# Patient Record
Sex: Male | Born: 1955
Health system: Southern US, Community
[De-identification: ages and names within clinical notes are randomized; demographics above are authoritative.]

## PROBLEM LIST (undated history)

## (undated) DIAGNOSIS — R112 Nausea with vomiting, unspecified: Secondary | ICD-10-CM

## (undated) DIAGNOSIS — Z9289 Personal history of other medical treatment: Secondary | ICD-10-CM

## (undated) DIAGNOSIS — N401 Enlarged prostate with lower urinary tract symptoms: Secondary | ICD-10-CM

## (undated) DIAGNOSIS — E785 Hyperlipidemia, unspecified: Secondary | ICD-10-CM

## (undated) DIAGNOSIS — K4090 Unilateral inguinal hernia, without obstruction or gangrene, not specified as recurrent: Secondary | ICD-10-CM

## (undated) DIAGNOSIS — I499 Cardiac arrhythmia, unspecified: Secondary | ICD-10-CM

## (undated) DIAGNOSIS — J45909 Unspecified asthma, uncomplicated: Secondary | ICD-10-CM

## (undated) DIAGNOSIS — G4700 Insomnia, unspecified: Secondary | ICD-10-CM

## (undated) DIAGNOSIS — J189 Pneumonia, unspecified organism: Secondary | ICD-10-CM

## (undated) DIAGNOSIS — I480 Paroxysmal atrial fibrillation: Secondary | ICD-10-CM

## (undated) DIAGNOSIS — M5126 Other intervertebral disc displacement, lumbar region: Secondary | ICD-10-CM

## (undated) DIAGNOSIS — L0390 Cellulitis, unspecified: Secondary | ICD-10-CM

## (undated) DIAGNOSIS — I4819 Other persistent atrial fibrillation: Secondary | ICD-10-CM

## (undated) DIAGNOSIS — F329 Major depressive disorder, single episode, unspecified: Secondary | ICD-10-CM

## (undated) DIAGNOSIS — F419 Anxiety disorder, unspecified: Secondary | ICD-10-CM

## (undated) DIAGNOSIS — K589 Irritable bowel syndrome without diarrhea: Secondary | ICD-10-CM

## (undated) DIAGNOSIS — R253 Fasciculation: Secondary | ICD-10-CM

## (undated) DIAGNOSIS — G4730 Sleep apnea, unspecified: Secondary | ICD-10-CM

## (undated) DIAGNOSIS — N138 Other obstructive and reflux uropathy: Secondary | ICD-10-CM

## (undated) DIAGNOSIS — Z9889 Other specified postprocedural states: Secondary | ICD-10-CM

## (undated) DIAGNOSIS — N529 Male erectile dysfunction, unspecified: Secondary | ICD-10-CM

## (undated) DIAGNOSIS — F32A Depression, unspecified: Secondary | ICD-10-CM

## (undated) DIAGNOSIS — I10 Essential (primary) hypertension: Secondary | ICD-10-CM

## (undated) DIAGNOSIS — T4145XA Adverse effect of unspecified anesthetic, initial encounter: Secondary | ICD-10-CM

## (undated) DIAGNOSIS — T8859XA Other complications of anesthesia, initial encounter: Secondary | ICD-10-CM

## (undated) DIAGNOSIS — G8929 Other chronic pain: Secondary | ICD-10-CM

## (undated) DIAGNOSIS — K219 Gastro-esophageal reflux disease without esophagitis: Secondary | ICD-10-CM

## (undated) DIAGNOSIS — M545 Low back pain, unspecified: Secondary | ICD-10-CM

## (undated) DIAGNOSIS — I428 Other cardiomyopathies: Secondary | ICD-10-CM

## (undated) DIAGNOSIS — F1120 Opioid dependence, uncomplicated: Secondary | ICD-10-CM

## (undated) DIAGNOSIS — N189 Chronic kidney disease, unspecified: Secondary | ICD-10-CM

## (undated) HISTORY — DX: Other obstructive and reflux uropathy: N13.8

## (undated) HISTORY — DX: Essential (primary) hypertension: I10

## (undated) HISTORY — DX: Other persistent atrial fibrillation: I48.19

## (undated) HISTORY — DX: Unilateral inguinal hernia, without obstruction or gangrene, not specified as recurrent: K40.90

## (undated) HISTORY — DX: Morbid (severe) obesity due to excess calories: E66.01

## (undated) HISTORY — DX: Personal history of other medical treatment: Z92.89

## (undated) HISTORY — DX: Anxiety disorder, unspecified: F41.9

## (undated) HISTORY — DX: Benign prostatic hyperplasia with lower urinary tract symptoms: N40.1

## (undated) HISTORY — DX: Fasciculation: R25.3

## (undated) HISTORY — PX: EYE SURGERY: SHX253

## (undated) HISTORY — DX: Gastro-esophageal reflux disease without esophagitis: K21.9

## (undated) HISTORY — DX: Paroxysmal atrial fibrillation: I48.0

## (undated) HISTORY — DX: Major depressive disorder, single episode, unspecified: F32.9

## (undated) HISTORY — PX: HERNIA REPAIR: SHX51

## (undated) HISTORY — DX: Depression, unspecified: F32.A

## (undated) HISTORY — DX: Hyperlipidemia, unspecified: E78.5

## (undated) HISTORY — DX: Other cardiomyopathies: I42.8

## (undated) HISTORY — DX: Irritable bowel syndrome, unspecified: K58.9

## (undated) HISTORY — DX: Male erectile dysfunction, unspecified: N52.9

## (undated) HISTORY — DX: Low back pain, unspecified: M54.50

## (undated) HISTORY — DX: Insomnia, unspecified: G47.00

## (undated) HISTORY — DX: Other chronic pain: G89.29

---

## 2000-03-29 ENCOUNTER — Emergency Department (HOSPITAL_COMMUNITY): Admission: EM | Admit: 2000-03-29 | Discharge: 2000-03-30 | Payer: Self-pay | Admitting: Emergency Medicine

## 2000-03-29 ENCOUNTER — Encounter: Payer: Self-pay | Admitting: Emergency Medicine

## 2000-04-03 ENCOUNTER — Ambulatory Visit (HOSPITAL_COMMUNITY): Admission: RE | Admit: 2000-04-03 | Discharge: 2000-04-03 | Payer: Self-pay | Admitting: Family Medicine

## 2000-04-03 ENCOUNTER — Encounter: Payer: Self-pay | Admitting: Family Medicine

## 2003-04-11 ENCOUNTER — Encounter: Payer: Self-pay | Admitting: Family Medicine

## 2003-04-12 ENCOUNTER — Inpatient Hospital Stay (HOSPITAL_COMMUNITY): Admission: EM | Admit: 2003-04-12 | Discharge: 2003-04-14 | Payer: Self-pay | Admitting: *Deleted

## 2003-04-12 ENCOUNTER — Encounter (INDEPENDENT_AMBULATORY_CARE_PROVIDER_SITE_OTHER): Payer: Self-pay | Admitting: *Deleted

## 2003-04-13 ENCOUNTER — Encounter: Payer: Self-pay | Admitting: Family Medicine

## 2003-07-28 ENCOUNTER — Ambulatory Visit (HOSPITAL_COMMUNITY): Admission: RE | Admit: 2003-07-28 | Discharge: 2003-07-28 | Payer: Self-pay | Admitting: Cardiology

## 2003-09-06 ENCOUNTER — Encounter: Admission: RE | Admit: 2003-09-06 | Discharge: 2003-12-05 | Payer: Self-pay | Admitting: *Deleted

## 2004-07-15 ENCOUNTER — Encounter: Admission: RE | Admit: 2004-07-15 | Discharge: 2004-07-15 | Payer: Self-pay | Admitting: Cardiology

## 2004-07-16 ENCOUNTER — Ambulatory Visit (HOSPITAL_COMMUNITY): Admission: RE | Admit: 2004-07-16 | Discharge: 2004-07-16 | Payer: Self-pay | Admitting: Cardiology

## 2004-11-25 ENCOUNTER — Inpatient Hospital Stay (HOSPITAL_COMMUNITY): Admission: AD | Admit: 2004-11-25 | Discharge: 2004-11-28 | Payer: Self-pay | Admitting: Cardiology

## 2004-12-06 ENCOUNTER — Ambulatory Visit (HOSPITAL_COMMUNITY): Admission: RE | Admit: 2004-12-06 | Discharge: 2004-12-06 | Payer: Self-pay | Admitting: Cardiology

## 2005-01-31 ENCOUNTER — Ambulatory Visit: Payer: Self-pay | Admitting: Cardiology

## 2005-02-05 ENCOUNTER — Ambulatory Visit: Payer: Self-pay | Admitting: *Deleted

## 2005-02-07 ENCOUNTER — Ambulatory Visit: Payer: Self-pay

## 2005-02-07 ENCOUNTER — Ambulatory Visit: Payer: Self-pay | Admitting: Cardiology

## 2005-02-13 ENCOUNTER — Ambulatory Visit: Payer: Self-pay | Admitting: Internal Medicine

## 2005-02-20 ENCOUNTER — Ambulatory Visit: Payer: Self-pay | Admitting: Internal Medicine

## 2005-02-28 ENCOUNTER — Ambulatory Visit: Payer: Self-pay | Admitting: Internal Medicine

## 2005-03-04 ENCOUNTER — Ambulatory Visit: Payer: Self-pay | Admitting: Cardiology

## 2005-03-07 ENCOUNTER — Ambulatory Visit: Payer: Self-pay | Admitting: Cardiology

## 2005-03-12 ENCOUNTER — Ambulatory Visit: Payer: Self-pay | Admitting: Cardiology

## 2005-03-12 ENCOUNTER — Ambulatory Visit (HOSPITAL_COMMUNITY): Admission: RE | Admit: 2005-03-12 | Discharge: 2005-03-12 | Payer: Self-pay | Admitting: Cardiology

## 2005-03-14 ENCOUNTER — Ambulatory Visit: Payer: Self-pay

## 2005-03-19 ENCOUNTER — Ambulatory Visit: Payer: Self-pay | Admitting: Cardiology

## 2005-03-25 ENCOUNTER — Ambulatory Visit: Payer: Self-pay | Admitting: Cardiology

## 2005-04-22 ENCOUNTER — Ambulatory Visit: Payer: Self-pay | Admitting: Cardiology

## 2005-05-06 ENCOUNTER — Ambulatory Visit: Payer: Self-pay | Admitting: Cardiology

## 2005-05-20 ENCOUNTER — Ambulatory Visit: Payer: Self-pay | Admitting: Internal Medicine

## 2005-06-03 ENCOUNTER — Ambulatory Visit: Payer: Self-pay | Admitting: *Deleted

## 2005-06-20 ENCOUNTER — Ambulatory Visit: Payer: Self-pay | Admitting: Cardiology

## 2005-07-07 HISTORY — PX: ATRIAL ABLATION SURGERY: SHX560

## 2005-07-16 ENCOUNTER — Ambulatory Visit: Payer: Self-pay | Admitting: *Deleted

## 2005-07-16 ENCOUNTER — Ambulatory Visit: Payer: Self-pay | Admitting: Cardiology

## 2005-07-30 ENCOUNTER — Ambulatory Visit: Payer: Self-pay | Admitting: Cardiology

## 2005-08-13 ENCOUNTER — Ambulatory Visit: Payer: Self-pay | Admitting: Cardiology

## 2005-08-20 ENCOUNTER — Ambulatory Visit: Payer: Self-pay

## 2005-09-19 ENCOUNTER — Ambulatory Visit: Payer: Self-pay | Admitting: Cardiology

## 2005-12-24 ENCOUNTER — Ambulatory Visit: Payer: Self-pay | Admitting: *Deleted

## 2006-01-28 ENCOUNTER — Ambulatory Visit: Payer: Self-pay | Admitting: Cardiology

## 2006-01-29 ENCOUNTER — Ambulatory Visit: Payer: Self-pay | Admitting: Internal Medicine

## 2006-01-29 ENCOUNTER — Ambulatory Visit: Payer: Self-pay | Admitting: Cardiology

## 2006-02-05 ENCOUNTER — Ambulatory Visit: Payer: Self-pay | Admitting: Cardiology

## 2006-02-13 ENCOUNTER — Ambulatory Visit: Payer: Self-pay | Admitting: Cardiology

## 2006-02-19 ENCOUNTER — Ambulatory Visit: Payer: Self-pay | Admitting: Cardiology

## 2006-02-26 ENCOUNTER — Ambulatory Visit: Payer: Self-pay | Admitting: Cardiology

## 2006-03-04 ENCOUNTER — Ambulatory Visit: Payer: Self-pay | Admitting: Cardiology

## 2006-03-05 ENCOUNTER — Ambulatory Visit: Payer: Self-pay | Admitting: Cardiology

## 2006-03-24 ENCOUNTER — Ambulatory Visit: Payer: Self-pay

## 2006-03-25 ENCOUNTER — Encounter: Payer: Self-pay | Admitting: Vascular Surgery

## 2006-03-25 ENCOUNTER — Ambulatory Visit: Payer: Self-pay | Admitting: Cardiovascular Disease

## 2006-03-25 ENCOUNTER — Ambulatory Visit (HOSPITAL_COMMUNITY): Admission: RE | Admit: 2006-03-25 | Discharge: 2006-03-25 | Payer: Self-pay | Admitting: Cardiology

## 2006-03-25 ENCOUNTER — Ambulatory Visit: Payer: Self-pay | Admitting: Cardiology

## 2006-03-27 ENCOUNTER — Ambulatory Visit: Payer: Self-pay | Admitting: Cardiovascular Disease

## 2006-04-03 ENCOUNTER — Ambulatory Visit: Payer: Self-pay | Admitting: Cardiology

## 2006-04-17 ENCOUNTER — Ambulatory Visit: Payer: Self-pay | Admitting: Cardiology

## 2006-04-22 ENCOUNTER — Ambulatory Visit: Payer: Self-pay | Admitting: Cardiology

## 2006-05-11 ENCOUNTER — Ambulatory Visit: Payer: Self-pay | Admitting: Internal Medicine

## 2006-06-03 ENCOUNTER — Ambulatory Visit: Payer: Self-pay | Admitting: Cardiovascular Disease

## 2006-06-15 ENCOUNTER — Ambulatory Visit: Payer: Self-pay | Admitting: Cardiology

## 2006-06-17 ENCOUNTER — Ambulatory Visit: Payer: Self-pay | Admitting: Cardiology

## 2006-09-09 ENCOUNTER — Ambulatory Visit: Payer: Self-pay | Admitting: *Deleted

## 2006-10-07 ENCOUNTER — Ambulatory Visit: Payer: Self-pay | Admitting: Cardiovascular Disease

## 2008-10-17 ENCOUNTER — Emergency Department (HOSPITAL_COMMUNITY): Admission: EM | Admit: 2008-10-17 | Discharge: 2008-10-17 | Payer: Self-pay | Admitting: Family Medicine

## 2009-02-09 ENCOUNTER — Telehealth: Payer: Self-pay | Admitting: Cardiology

## 2009-02-12 DIAGNOSIS — I10 Essential (primary) hypertension: Secondary | ICD-10-CM

## 2009-02-12 DIAGNOSIS — E785 Hyperlipidemia, unspecified: Secondary | ICD-10-CM | POA: Insufficient documentation

## 2009-02-12 DIAGNOSIS — I4891 Unspecified atrial fibrillation: Secondary | ICD-10-CM

## 2009-02-13 ENCOUNTER — Inpatient Hospital Stay (HOSPITAL_COMMUNITY): Admission: EM | Admit: 2009-02-13 | Discharge: 2009-02-15 | Payer: Self-pay | Admitting: Emergency Medicine

## 2009-02-13 ENCOUNTER — Encounter: Payer: Self-pay | Admitting: Internal Medicine

## 2009-02-13 ENCOUNTER — Ambulatory Visit: Payer: Self-pay | Admitting: Cardiology

## 2009-02-13 DIAGNOSIS — F418 Other specified anxiety disorders: Secondary | ICD-10-CM | POA: Insufficient documentation

## 2009-02-13 DIAGNOSIS — F329 Major depressive disorder, single episode, unspecified: Secondary | ICD-10-CM | POA: Insufficient documentation

## 2009-02-13 DIAGNOSIS — J309 Allergic rhinitis, unspecified: Secondary | ICD-10-CM | POA: Insufficient documentation

## 2009-02-14 ENCOUNTER — Encounter: Payer: Self-pay | Admitting: Internal Medicine

## 2009-02-14 ENCOUNTER — Encounter (INDEPENDENT_AMBULATORY_CARE_PROVIDER_SITE_OTHER): Payer: Self-pay | Admitting: Emergency Medicine

## 2009-02-14 ENCOUNTER — Encounter: Payer: Self-pay | Admitting: Cardiology

## 2009-02-15 ENCOUNTER — Encounter: Payer: Self-pay | Admitting: Internal Medicine

## 2009-02-17 ENCOUNTER — Encounter: Payer: Self-pay | Admitting: Internal Medicine

## 2009-02-17 ENCOUNTER — Ambulatory Visit: Payer: Self-pay | Admitting: Internal Medicine

## 2009-02-17 ENCOUNTER — Emergency Department (HOSPITAL_COMMUNITY): Admission: EM | Admit: 2009-02-17 | Discharge: 2009-02-17 | Payer: Self-pay | Admitting: Emergency Medicine

## 2009-02-26 ENCOUNTER — Ambulatory Visit: Payer: Self-pay | Admitting: Cardiovascular Disease

## 2009-02-26 LAB — CONVERTED CEMR LAB: POC INR: 2.9

## 2009-03-05 ENCOUNTER — Ambulatory Visit: Payer: Self-pay | Admitting: Internal Medicine

## 2009-03-07 ENCOUNTER — Encounter (INDEPENDENT_AMBULATORY_CARE_PROVIDER_SITE_OTHER): Payer: Self-pay | Admitting: *Deleted

## 2009-03-08 ENCOUNTER — Telehealth: Payer: Self-pay | Admitting: Cardiology

## 2009-03-14 ENCOUNTER — Encounter: Payer: Self-pay | Admitting: Cardiology

## 2009-03-14 ENCOUNTER — Ambulatory Visit: Payer: Self-pay | Admitting: Cardiology

## 2009-03-14 DIAGNOSIS — I080 Rheumatic disorders of both mitral and aortic valves: Secondary | ICD-10-CM

## 2009-03-14 DIAGNOSIS — I340 Nonrheumatic mitral (valve) insufficiency: Secondary | ICD-10-CM | POA: Insufficient documentation

## 2009-03-14 DIAGNOSIS — I420 Dilated cardiomyopathy: Secondary | ICD-10-CM

## 2009-03-19 ENCOUNTER — Ambulatory Visit: Payer: Self-pay | Admitting: Cardiovascular Disease

## 2009-05-02 ENCOUNTER — Encounter: Payer: Self-pay | Admitting: *Deleted

## 2009-05-22 ENCOUNTER — Encounter (INDEPENDENT_AMBULATORY_CARE_PROVIDER_SITE_OTHER): Payer: Self-pay | Admitting: Pharmacist

## 2009-06-04 ENCOUNTER — Encounter (INDEPENDENT_AMBULATORY_CARE_PROVIDER_SITE_OTHER): Payer: Self-pay | Admitting: Cardiology

## 2009-06-04 ENCOUNTER — Ambulatory Visit: Payer: Self-pay

## 2009-06-04 ENCOUNTER — Ambulatory Visit: Payer: Self-pay | Admitting: Cardiology

## 2009-06-07 ENCOUNTER — Ambulatory Visit: Payer: Self-pay | Admitting: Cardiology

## 2009-06-07 ENCOUNTER — Encounter: Payer: Self-pay | Admitting: Cardiovascular Disease

## 2009-06-07 LAB — CONVERTED CEMR LAB: POC INR: 2.5

## 2009-06-08 ENCOUNTER — Encounter: Payer: Self-pay | Admitting: Cardiology

## 2009-06-08 ENCOUNTER — Ambulatory Visit (HOSPITAL_COMMUNITY): Admission: RE | Admit: 2009-06-08 | Discharge: 2009-06-08 | Payer: Self-pay | Admitting: Cardiology

## 2009-06-14 ENCOUNTER — Ambulatory Visit: Payer: Self-pay | Admitting: Cardiology

## 2009-06-14 ENCOUNTER — Encounter (INDEPENDENT_AMBULATORY_CARE_PROVIDER_SITE_OTHER): Payer: Self-pay | Admitting: *Deleted

## 2009-06-14 ENCOUNTER — Encounter (INDEPENDENT_AMBULATORY_CARE_PROVIDER_SITE_OTHER): Payer: Self-pay | Admitting: Cardiology

## 2009-06-14 LAB — CONVERTED CEMR LAB
BUN: 12 mg/dL (ref 6–23)
Basophils Absolute: 0.1 10*3/uL (ref 0.0–0.1)
Calcium: 8.9 mg/dL (ref 8.4–10.5)
Creatinine, Ser: 1 mg/dL (ref 0.4–1.5)
Eosinophils Absolute: 0.2 10*3/uL (ref 0.0–0.7)
GFR calc non Af Amer: 83.41 mL/min (ref >60)
Glucose, Bld: 139 mg/dL — ABNORMAL HIGH (ref 70–99)
Lymphocytes Relative: 27 % (ref 12.0–46.0)
Monocytes Relative: 7.5 % (ref 3.0–12.0)
Neutrophils Relative %: 62.3 % (ref 43.0–77.0)
POC INR: 3.2
Platelets: 286 10*3/uL (ref 150.0–400.0)
Prothrombin Time: 26.2 s — ABNORMAL HIGH (ref 9.1–11.7)
RDW: 12.7 % (ref 11.5–14.6)
Sodium: 140 meq/L (ref 135–145)

## 2009-06-18 ENCOUNTER — Ambulatory Visit: Payer: Self-pay | Admitting: Cardiology

## 2009-07-10 ENCOUNTER — Encounter: Payer: Self-pay | Admitting: Cardiology

## 2009-07-16 ENCOUNTER — Ambulatory Visit: Payer: Self-pay | Admitting: Internal Medicine

## 2009-07-16 ENCOUNTER — Encounter (INDEPENDENT_AMBULATORY_CARE_PROVIDER_SITE_OTHER): Payer: Self-pay | Admitting: Cardiology

## 2009-07-30 ENCOUNTER — Ambulatory Visit: Payer: Self-pay | Admitting: Cardiology

## 2009-08-30 ENCOUNTER — Encounter (INDEPENDENT_AMBULATORY_CARE_PROVIDER_SITE_OTHER): Payer: Self-pay | Admitting: Cardiology

## 2009-09-07 ENCOUNTER — Telehealth: Payer: Self-pay | Admitting: Cardiology

## 2009-10-30 ENCOUNTER — Encounter (INDEPENDENT_AMBULATORY_CARE_PROVIDER_SITE_OTHER): Payer: Self-pay | Admitting: *Deleted

## 2009-11-23 ENCOUNTER — Encounter (INDEPENDENT_AMBULATORY_CARE_PROVIDER_SITE_OTHER): Payer: Self-pay | Admitting: Pharmacist

## 2010-03-02 ENCOUNTER — Emergency Department (HOSPITAL_COMMUNITY): Admission: EM | Admit: 2010-03-02 | Discharge: 2010-03-02 | Payer: Self-pay | Admitting: Emergency Medicine

## 2010-03-26 ENCOUNTER — Telehealth: Payer: Self-pay | Admitting: Cardiology

## 2010-07-30 ENCOUNTER — Encounter (INDEPENDENT_AMBULATORY_CARE_PROVIDER_SITE_OTHER): Payer: Self-pay | Admitting: Pharmacist

## 2010-08-06 NOTE — Letter (Signed)
Summary: Custom - Delinquent Coumadin 1  Coumadin  [address]   [place], [place] [address]   Phone: [phone]  Fax: [phone]     August 30, 2009 MRN: [id number]   [name] [address] [place], [place]  [address]   Dear [name],  This letter is being sent to you as a reminder that it is necessary for you to get your INR/PT checked regularly so that we can optimize your care.  Our records indicate that you were scheduled to have a test done recently.  As of today, we have not received the results of this test.  It is very important that you have your INR checked.  Please call our office at the number listed above to schedule an appointment at your earliest convenience.    If you have recently had your protime checked or have discontinued this medication, please contact our office at the above phone number to clarify this issue.  Thank you for this prompt attention to this important health care matter.  Sincerely,   [facility] Team

## 2010-08-06 NOTE — Medication Information (Signed)
Summary: rovmp  Anticoagulant Therapy  Managed by: Shelby Dubin, PharmD, BCPS, CPP Referring MD: Dr [name] PCP: Dr Fulton Mole Supervising MD: Tenny Craw MD, Gunnar Fusi Indication 1: Atrial Fibrillation Lab Used: [facility] Site: [facility] INR POC 2.2 INR RANGE 2.0-3.0  Dietary changes: yes       Details: lost 7 pounds with diet  Health status changes: no    Bleeding/hemorrhagic complications: yes       Details: bloody nose with dry weather  Recent/future hospitalizations: no    Any changes in medication regimen? no    Recent/future dental: no  Any missed doses?: no       Is patient compliant with meds? yes       Allergies (verified): No Known Drug Allergies  Anticoagulation Management History:      The patient is taking warfarin and comes in today for a routine follow up visit.  Negative risk factors for bleeding include an age less than 41 years old.  The bleeding index is 'low risk'.  Positive CHADS2 values include History of HTN.  Negative CHADS2 values include Age > 74 years old.  His last INR was 2.5 ratio.  Anticoagulation responsible provider: Tenny Craw MD, Gunnar Fusi.  INR POC: 2.2.  Cuvette Lot#: [id number].  Exp: 08/2010.    Anticoagulation Management Assessment/Plan:      The patient's current anticoagulation dose is Coumadin 5 mg tabs: Take as directed by coumadin clinic..  The target INR is 2.0-3.0.  The next INR is due 08/13/2009.  Anticoagulation instructions were given to patient.  Results were reviewed/authorized by Shelby Dubin, PharmD, BCPS, CPP.  He was notified by Shelby Dubin PharmD, BCPS, CPP.         Prior Anticoagulation Instructions: INR 2.1 Continue 7.5mg s daily except 5mg s on Fridays. Recheck in 3 weeks.   Current Anticoagulation Instructions: INR 2.2  Continue 1 tab on Friday and 1.5 tabs on all other days.   Recheck in 4 weeks.

## 2010-08-06 NOTE — Miscellaneous (Signed)
  Clinical Lists Changes  Observations: Added new observation of RESULTS MISC:                     CONSULTATION  cardioversion     This is cardioversion of atrial fibrillation.  A transesophageal   echocardiogram was performed prior to the procedure that showed no left   atrial appendage thrombus.  The patient was subsequently sedated by   Anesthesia with Diprivan 80 mg intravenously.  Synchronized   cardioversion with 120 joules resulted in sinus rhythm.  There were no   immediate complications.  We would recommend continuing the Coumadin.               Madolyn Frieze [name], MD, [facility]   Electronically Signed            BSC/MEDQ  D:  06/08/2009  T:  06/09/2009  Job:  [id number]   (06/08/2009 13:17)      MISC. Report  Procedure date:  06/08/2009  Findings:                          CONSULTATION  cardioversion     This is cardioversion of atrial fibrillation.  A transesophageal   echocardiogram was performed prior to the procedure that showed no left   atrial appendage thrombus.  The patient was subsequently sedated by   Anesthesia with Diprivan 80 mg intravenously.  Synchronized   cardioversion with 120 joules resulted in sinus rhythm.  There were no   immediate complications.  We would recommend continuing the Coumadin.               Madolyn Frieze [name], MD, [facility]   Electronically Signed            BSC/MEDQ  D:  06/08/2009  T:  06/09/2009  Job:  [id number]

## 2010-08-06 NOTE — Progress Notes (Signed)
Summary: pt rtn call  Phone Note Call from Patient Call back at Work Phone [phone]   Caller: Patient Reason for Call: Talk to Nurse, Talk to Doctor Summary of Call: pt was rtning call to Stanton Kidney she has tried to contact him several time and he has been out of his coumadin for a week and really needs to talk to someone asap. I tried calling coumadin several times and did not get an answer Initial call taken by: Omer Jack,  March 26, 2010 12:49 PM  Follow-up for Phone Call        Telephoned pt he was last seen in CVRR in January. Thus, need to  schedule an appt. Appt made for 03/27/10 Follow-up by: Bethena Midget, RN, BSN,  March 26, 2010 1:30 PM

## 2010-08-06 NOTE — Letter (Signed)
Summary: Handout Printed  Printed Handout:  - Coumadin Instructions-w/out Meds 

## 2010-08-06 NOTE — Progress Notes (Signed)
Summary: REFILL  Phone Note Refill Request Call back at Work Phone [phone] Message from:  Patient on September 07, 2009 2:06 PM  Refills Requested: Medication #1:  LISINOPRIL 2.5 MG TABS Take 1 tablet by mouth once a day [facility] [place] [facility] IS OUT [phone]  Initial call taken by: Judie Grieve,  September 07, 2009 2:07 PM    Prescriptions: LISINOPRIL 2.5 MG TABS (LISINOPRIL) Take 1 tablet by mouth once a day  #30 x 12   Entered by:   Kem Parkinson   Authorized by:   Ferman Hamming, MD, [facility]   Signed by:   Kem Parkinson on 09/07/2009   Method used:   Electronically to        [facility] [address] [phone]* (retail)       [address]       [address]       [place], [place]  [address]       Ph: [phone]       Fax: [phone]   RxID:   [id number]

## 2010-08-06 NOTE — Letter (Signed)
Summary: Custom - Delinquent Coumadin 2  Coumadin  [address]   [place], [place] [address]   Phone: [phone]  Fax: [phone]     Nov 23, 2009 MRN: [id number]   [name] [address] [place], [place]  [address]   Dear [name],  We have attempted to contact you by phone and letter on multiple occasions to contact our office for important blood work associated with the blood thinner, warfarin (Coumadin).  Warfarin is a very important drug that can cause life threatening side effects including, bleeding, and thus requires close laboratory monitoring.  We are unable to accept responsibility for blood thinner-related health problems you may develop because you have not followed our recommendations for appropriate monitoring.  These may include abnormal bleeding occurrences and/or development of blood clots (stroke, heart attack, blood clots in legs or lungs, etc.).  We need for you to contact this office at the number listed above to schedule and complete this very important blood work.  Thank you for your assistance in this urgent matter.  Sincerely,  [facility] Team

## 2010-08-06 NOTE — Letter (Signed)
Summary: Appointment - Missed  [facility], Main Office  [address]   [place], [place] [address]   Phone: [phone]  Fax: [phone]     October 30, 2009 MRN: [id number]   [name] [address] [place], [place]  [address]   Dear Mr. Handa,  Our records indicate you missed your appointment on 10/18/09 with Dr. Johney Frame .It is very important that we reach you to reschedule this appointment. We look forward to participating in your health care needs. Please contact us at the number listed above at your earliest convenience to reschedule this appointment.     Sincerely,   [facility] Scheduling Team

## 2010-08-06 NOTE — Assessment & Plan Note (Signed)
Summary: per check out/sf   Primary Provider:  Dr Fulton Mole   History of Present Illness: Pleasant male with history of paroxysmal atrial fibrillation returns for followup. The patient had a cardiac catheterization in 2004 that showed an ejection fraction of 45% and normal coronary arteries. He did have atrial fibrillation at that time and his LV function improved after  sinus rhythm was restored. He ultimately had atrial fibrillation ablation. He did well for several years. However in August of 2010 he was admitted to [facility] with recurrent atrial fibrillation. A TSH was normal. An echocardiogram performed on August 11 of 2010 showed an ejection fraction of 35-40%. There was biatrial enlargement. There was at least moderate mitral regurgitation. The patient subsequently had a transesophageal echocardiogram that showed an ejection fraction of 35% and moderate mitral regurgitation. He underwent cardioversion at that time as there was no left atrial appendage thrombus. He was seen in followup by Dr. Johney Frame and close followup was felt to be indicated. Possible attempt at another ablation could be considered in the future if his atrial fibrillation recurred. I last saw him in November of 2010 and atrial fibrillation had recurred. We therefore proceeded with a TEE guided cardioversion on June 08, 2009. His ejection fraction was 20-25%, mild mitral regurgitation, moderate to severe left atrial enlargement and mild to moderate right atrial enlargement. Since then he feels well. He denies any dyspnea, chest pain, palpitations or syncope. There is no bleeding.  Current Medications (verified): 1)  Cardizem Cd 360 Mg Xr24h-Cap (Diltiazem Hcl Coated Beads) .... Take 1 Capsule By Mouth Once A Day 2)  Coumadin 5 Mg Tabs (Warfarin Sodium) .... Take As Directed By Coumadin Clinic. 3)  Cymbalta 30 Mg Cpep (Duloxetine Hcl) .... Take 1 Tablet By Mouth Once A Day 4)  Lisinopril 2.5 Mg Tabs (Lisinopril)  .... Take 1 Tablet By Mouth Once A Day 5)  Singulair 10 Mg Tabs (Montelukast Sodium) .... Take 1 Tablet By Mouth Once A Day  Allergies: No Known Drug Allergies  Past History:  Past Medical History: Reviewed history from 03/05/2009 and no changes required. PAROXYSMAL ATRIAL FIBRILLATION (ICD-427.31) HYPERLIPIDEMIA (ICD-272.4) HYPERTENSION (ICD-401.9) COUMADIN THERAPY (ICD-V58.61) DEPRESSION (ICD-311) ALLERGIC RHINITIS (ICD-477.9) MODERATE MR NONISCHEMIC CM (EF 35%)  Past Surgical History: Reviewed history from 03/05/2009 and no changes required.  afib ablation at [facility] 2007  Social History: Reviewed history from 03/05/2009 and no changes required.   The patient denies use of tobacco, alcohol or recreational  drugs. 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 The patient admits to heavy caffeine use and use of decongestants in the last week.  Review of Systems       no fevers or chills, productive cough, hemoptysis, dysphasia, odynophagia, melena, hematochezia, dysuria, hematuria, rash, seizure activity, orthopnea, PND, pedal edema, claudication. Remaining systems are negative.   Vital Signs:  Patient profile:   55 year old male Height:      76 inches Weight:      272 pounds BMI:     33.23 Pulse rate:   89 / minute Resp:     14 per minute BP sitting:   121 / 85  (right arm)  Vitals Entered By: Kem Parkinson (July 30, 2009 4:13 PM)  Physical Exam  General:  Well-developed well-nourished in no acute distress.  Skin is warm and dry.  HEENT is normal.  Neck is supple. No thyromegaly.  Chest is clear to auscultation with normal expansion.  Cardiovascular exam is regular rate and rhythm.  Abdominal exam nontender or distended. No masses palpated. Extremities show no edema. neuro grossly intact    EKG  Procedure date:  07/30/2009  Findings:      Sinus rhythm at a rate of 83.  Axis normal. Incomplete right bundle branch block.  Impression & Recommendations:  Problem # 1:  PAROXYSMAL ATRIAL FIBRILLATION (ICD-427.31) The patient remains in atrial fibrillation status post TEE guided cardioversion. If his atrial fibrillation recurs we will refer her back to [facility] for consideration of repeat ablation. He will continue on his Cardizem and Coumadin. His updated medication list for this problem includes:    Coumadin 5 Mg Tabs (Warfarin sodium) .Marland Kitchen... Take as directed by coumadin clinic.    Coreg 6.25 Mg Tabs (Carvedilol) .Marland Kitchen... 1 tablet 2 times per day  Orders: EKG w/ Interpretation (93[address])  Problem # 2:  CARDIOMYOPATHY (ICD-425.4) The patient has a nonischemic cardio myopathy of uncertain etiology. I have felt previously that this was tachycardia mediated. We will plan to repeat his echocardiogram now that he is in sinus rhythm. I will increase his Coreg to 6.25 mg p.o. b.i.d. Note he did not tolerate Toprol in the past but seems to be tolerating Coreg. If so we will continue to titrate up his Coreg and decrease his Cardizem. His updated medication list for this problem includes:    Cardizem Cd 360 Mg Xr24h-cap (Diltiazem hcl coated beads) .Marland Kitchen... Take 1 capsule by mouth once a day    Coumadin 5 Mg Tabs (Warfarin sodium) .Marland Kitchen... Take as directed by coumadin clinic.    Lisinopril 2.5 Mg Tabs (Lisinopril) .Marland Kitchen... Take 1 tablet by mouth once a day    Coreg 6.25 Mg Tabs (Carvedilol) .Marland Kitchen... 1 tablet 2 times per day  Orders: Echocardiogram (Echo)  Problem # 3:  MITRAL INSUFFICIENCY (ICD-396.3) Followup echocardiogram.  Problem # 4:  COUMADIN THERAPY (ICD-V58.61) Monitored in the Coumadin clinic. Goal INR 2-3.  Problem # 5:  HYPERTENSION (ICD-401.9) Blood pressure controlled on present medications. Will continue. His updated medication list for this problem includes:    Cardizem Cd 360 Mg Xr24h-cap (Diltiazem hcl coated beads) .Marland Kitchen... Take 1 capsule by mouth once a day     Lisinopril 2.5 Mg Tabs (Lisinopril) .Marland Kitchen... Take 1 tablet by mouth once a day    Coreg 6.25 Mg Tabs (Carvedilol) .Marland Kitchen... 1 tablet 2 times per day  Patient Instructions: 1)  Your physician has requested that you have an echocardiogram.  Echocardiography is a painless test that uses sound waves to create images of your heart. It provides your doctor with information about the size and shape of your heart and how well your heart's chambers and valves are working.  This procedure takes approximately one hour. There are no restrictions for this procedure. 2)  Your physician wants you to follow-up in: 6 months  You will receive a reminder letter in the mail two months in advance. If you don't receive a letter, please call our office to schedule the follow-up appointment. Prescriptions: COREG 6.25 MG TABS (CARVEDILOL) 1 tablet 2 times per day  #60 x 6   Entered by:   Deliah Goody, RN   Authorized by:   Ferman Hamming, MD, [facility]   Signed by:   Deliah Goody, RN on 07/30/2009   Method used:   Electronically to        [facility] [address] [phone]* (retail)  [address]       [address]       [place], [place]  [address]       Ph: [phone]       Fax: [phone]   RxID:   [id number]

## 2010-08-08 NOTE — Letter (Signed)
Summary: Custom - Delinquent Coumadin 1  Coumadin  [address]   [place], [place] [address]   Phone: [phone]  Fax: [phone]     July 30, 2010 MRN: [id number]   [name] [address] [place], [place]  [address]   Dear [name],  This letter is being sent to you as a reminder that it is necessary for you to get your INR/PT checked regularly so that we can optimize your care.  Our records indicate that you were scheduled to have a test done recently.  As of today, we have not received the results of this test.  It is very important that you have your INR checked.  Please call our office at the number listed above to schedule an appointment at your earliest convenience.    If you have recently had your protime checked or have discontinued this medication, please contact our office at the above phone number to clarify this issue.  Thank you for this prompt attention to this important health care matter.  Sincerely,   [facility] Team    Appended Document: Custom - Delinquent Coumadin 1 LMOM for pt to call for past due cvrr appt.

## 2010-09-18 ENCOUNTER — Encounter: Payer: Self-pay | Admitting: Cardiology

## 2010-09-18 DIAGNOSIS — I4891 Unspecified atrial fibrillation: Secondary | ICD-10-CM

## 2010-09-18 DIAGNOSIS — Z7901 Long term (current) use of anticoagulants: Secondary | ICD-10-CM | POA: Insufficient documentation

## 2010-09-19 LAB — TROPONIN I: Troponin I: 0.02 ng/mL (ref 0.00–0.06)

## 2010-09-24 ENCOUNTER — Other Ambulatory Visit: Payer: Self-pay | Admitting: Cardiology

## 2010-09-26 NOTE — Telephone Encounter (Signed)
Pt calling back wanting to know if dr [name] was still alive because that must be the reason his refill for lisinipril hasn't been called in to [facility]

## 2010-09-27 ENCOUNTER — Other Ambulatory Visit: Payer: Self-pay | Admitting: Cardiology

## 2010-09-27 ENCOUNTER — Telehealth: Payer: Self-pay | Admitting: *Deleted

## 2010-09-27 DIAGNOSIS — I10 Essential (primary) hypertension: Secondary | ICD-10-CM

## 2010-09-27 MED ORDER — LISINOPRIL 2.5 MG PO TABS: 2.5000 mg | ORAL_TABLET | Freq: Every day | ORAL | Status: DC

## 2010-09-27 NOTE — Telephone Encounter (Signed)
Left message to patient to call back . Last ov 07-30-09. Patient needs an appointment past due . Month supply sent to pharmacy.

## 2010-09-28 ENCOUNTER — Other Ambulatory Visit: Payer: Self-pay | Admitting: Cardiology

## 2010-09-30 ENCOUNTER — Other Ambulatory Visit: Payer: Self-pay

## 2010-09-30 DIAGNOSIS — I428 Other cardiomyopathies: Secondary | ICD-10-CM

## 2010-09-30 MED ORDER — DILTIAZEM HCL ER BEADS 360 MG PO CP24: 360.0000 mg | ORAL_CAPSULE | Freq: Every day | ORAL | Status: DC

## 2010-10-03 NOTE — Telephone Encounter (Signed)
[address] °

## 2010-10-12 LAB — POCT I-STAT, CHEM 8
Chloride: 109 mEq/L (ref 96–112)
Glucose, Bld: 105 mg/dL — ABNORMAL HIGH (ref 70–99)
HCT: 48 % (ref 39.0–52.0)
Potassium: 4.7 mEq/L (ref 3.5–5.1)

## 2010-10-12 LAB — COMPREHENSIVE METABOLIC PANEL
AST: 21 U/L (ref 0–37)
Albumin: 3.8 g/dL (ref 3.5–5.2)
Alkaline Phosphatase: 80 U/L (ref 39–117)
BUN: 10 mg/dL (ref 6–23)
BUN: 16 mg/dL (ref 6–23)
CO2: 28 mEq/L (ref 19–32)
CO2: 28 mEq/L (ref 19–32)
Chloride: 103 mEq/L (ref 96–112)
Chloride: 103 mEq/L (ref 96–112)
Creatinine, Ser: 1.08 mg/dL (ref 0.4–1.5)
Creatinine, Ser: 1.22 mg/dL (ref 0.4–1.5)
GFR calc Af Amer: >60 mL/min (ref >60)
GFR calc non Af Amer: >60 mL/min (ref >60)
GFR calc non Af Amer: >60 mL/min (ref >60)
Glucose, Bld: 118 mg/dL — ABNORMAL HIGH (ref 70–99)
Potassium: 4 mEq/L (ref 3.5–5.1)
Total Bilirubin: 0.9 mg/dL (ref 0.3–1.2)
Total Bilirubin: 1.2 mg/dL (ref 0.3–1.2)

## 2010-10-12 LAB — PROTIME-INR
INR: 1 (ref 0.00–1.49)
INR: 1.2 (ref 0.00–1.49)
Prothrombin Time: 15.4 seconds — ABNORMAL HIGH (ref 11.6–15.2)
Prothrombin Time: 15.5 seconds — ABNORMAL HIGH (ref 11.6–15.2)

## 2010-10-12 LAB — POCT CARDIAC MARKERS
CKMB, poc: <1 ng/mL — ABNORMAL LOW (ref 1.0–8.0)
Troponin i, poc: <0.05 ng/mL (ref 0.00–0.09)

## 2010-10-12 LAB — DIFFERENTIAL
Basophils Absolute: 0.2 10*3/uL — ABNORMAL HIGH (ref 0.0–0.1)
Basophils Relative: 1 % (ref 0–1)
Eosinophils Relative: 1 % (ref 0–5)
Lymphocytes Relative: 20 % (ref 12–46)
Monocytes Absolute: 1.6 10*3/uL — ABNORMAL HIGH (ref 0.1–1.0)

## 2010-10-12 LAB — URINALYSIS, ROUTINE W REFLEX MICROSCOPIC
Bilirubin Urine: NEGATIVE
Ketones, ur: NEGATIVE mg/dL
Nitrite: NEGATIVE
Specific Gravity, Urine: >1.046 — ABNORMAL HIGH (ref 1.005–1.030)
Urobilinogen, UA: 1 mg/dL (ref 0.0–1.0)

## 2010-10-12 LAB — CBC
HCT: 43.2 % (ref 39.0–52.0)
HCT: 47.9 % (ref 39.0–52.0)
MCV: 90.7 fL (ref 78.0–100.0)
MCV: 91.5 fL (ref 78.0–100.0)
RBC: 4.72 MIL/uL (ref 4.22–5.81)
RBC: 5.29 MIL/uL (ref 4.22–5.81)
WBC: 11.6 10*3/uL — ABNORMAL HIGH (ref 4.0–10.5)
WBC: 15.1 10*3/uL — ABNORMAL HIGH (ref 4.0–10.5)

## 2010-10-12 LAB — APTT: aPTT: 166 seconds — ABNORMAL HIGH (ref 24–37)

## 2010-10-12 LAB — LIPID PANEL
Cholesterol: 126 mg/dL (ref 0–200)
LDL Cholesterol: 59 mg/dL (ref 0–99)
VLDL: 19 mg/dL (ref 0–40)

## 2010-10-12 LAB — HEPARIN LEVEL (UNFRACTIONATED): Heparin Unfractionated: 0.59 IU/mL (ref 0.30–0.70)

## 2010-10-12 LAB — LACTIC ACID, PLASMA: Lactic Acid, Venous: 1.2 mmol/L (ref 0.5–2.2)

## 2010-10-29 ENCOUNTER — Other Ambulatory Visit: Payer: Self-pay | Admitting: Cardiology

## 2010-10-31 ENCOUNTER — Encounter: Payer: Self-pay | Admitting: Cardiology

## 2010-11-01 ENCOUNTER — Encounter: Payer: Self-pay | Admitting: Cardiology

## 2010-11-01 NOTE — Progress Notes (Signed)
[id number] male with history of paroxysmal atrial fibrillation returns for followup. The patient had a cardiac catheterization in 2004 that showed an ejection fraction of 45% and normal coronary arteries. He did have atrial fibrillation at that time and his LV function improved after  sinus rhythm was restored. He ultimately had atrial fibrillation ablation. He did well for several years. However in August of 2010 he was admitted to [facility] with recurrent atrial fibrillation. A TSH was normal. An echocardiogram performed on August 11 of 2010 showed an ejection fraction of 35-40%. There was biatrial enlargement. There was at least moderate mitral regurgitation. The patient subsequently had a transesophageal echocardiogram that showed an ejection fraction of 35% and moderate mitral regurgitation. He underwent cardioversion at that time as there was no left atrial appendage thrombus. He was seen in followup by Dr. Johney Frame and close followup was felt to be indicated. Possible attempt at another ablation could be considered in the future if his atrial fibrillation recurred. Atrial fibrillation recurred in November 2010. We therefore proceeded with a TEE guided cardioversion on June 08, 2009. His ejection fraction was 20-25%, mild mitral regurgitation, moderate to severe left atrial enlargement and mild to moderate right atrial enlargement. I last saw him in Jan of 2011. We recommended a FU echo but he did not come for this. Since I last saw him,   Current Outpatient Prescriptions  Medication Sig Dispense Refill  . carvedilol (COREG) 6.25 MG tablet Take 6.25 mg by mouth 2 (two) times daily with a meal.        . diltiazem (TIAZAC) 360 MG 24 hr capsule Take 1 capsule (360 mg total) by mouth daily.  30 capsule  11  . DULoxetine (CYMBALTA) 30 MG capsule Take 30 mg by mouth daily.        Marland Kitchen lisinopril (PRINIVIL,ZESTRIL) 2.5 MG tablet TAKE 1 TABLET BY MOUTH EVERY DAY  30 tablet  0  . montelukast  (SINGULAIR) 10 MG tablet Take 10 mg by mouth at bedtime.        Marland Kitchen warfarin (COUMADIN) 5 MG tablet Take 5 mg by mouth as directed.           Past Medical History  Diagnosis Date  . PAF (paroxysmal atrial fibrillation)   . HLD (hyperlipidemia)   . HTN (hypertension)   . Depression   . Allergic rhinitis   . NICM (nonischemic cardiomyopathy)     Past Surgical History  Procedure Date  . [facility] 2007    [facility]    History   Social History  . Marital Status: Married    Spouse Name: N/A    Number of Children: N/A  . Years of Education: N/A   Occupational History  . owns landscaping business    Social History Main Topics  . Smoking status: Never Smoker   . Smokeless tobacco: Not on file  . Alcohol Use: No  . Drug Use: No  . Sexually Active: Not on file   Other Topics Concern  . Not on file   Social History Narrative  . No narrative on file    ROS: no fevers or chills, productive cough, hemoptysis, dysphasia, odynophagia, melena, hematochezia, dysuria, hematuria, rash, seizure activity, orthopnea, PND, pedal edema, claudication. Remaining systems are negative.  Physical Exam: Well-developed well-nourished in no acute distress.  Skin is warm and dry.  HEENT is normal.  Neck is supple. No thyromegaly.  Chest is clear to auscultation with normal expansion.  Cardiovascular exam is regular  rate and rhythm.  Abdominal exam nontender or distended. No masses palpated. Extremities show no edema. neuro grossly intact  ECG     This encounter was created in error - please disregard.

## 2010-11-04 ENCOUNTER — Ambulatory Visit (INDEPENDENT_AMBULATORY_CARE_PROVIDER_SITE_OTHER): Payer: BC Managed Care – PPO | Admitting: Cardiology

## 2010-11-04 ENCOUNTER — Encounter: Payer: Self-pay | Admitting: Cardiology

## 2010-11-04 VITALS — BP 140/98 | HR 75 | Resp 18 | Ht 76.0 in | Wt 265.0 lb

## 2010-11-04 DIAGNOSIS — I4891 Unspecified atrial fibrillation: Secondary | ICD-10-CM

## 2010-11-04 DIAGNOSIS — I080 Rheumatic disorders of both mitral and aortic valves: Secondary | ICD-10-CM

## 2010-11-04 DIAGNOSIS — I10 Essential (primary) hypertension: Secondary | ICD-10-CM

## 2010-11-04 DIAGNOSIS — I428 Other cardiomyopathies: Secondary | ICD-10-CM

## 2010-11-04 MED ORDER — LISINOPRIL 10 MG PO TABS: 10.0000 mg | ORAL_TABLET | Freq: Every day | ORAL | Status: DC

## 2010-11-04 NOTE — Assessment & Plan Note (Signed)
Patient remains in sinus rhythm.I think his LV function was reduced previously because of atrial fibrillation. Plan repeat echocardiogram. If LV function normalized then only embolic risk factor is hypertension and he will continue aspirin. If his LV function is less than 40 we will discontinue aspirin and resume Coumadin. Continue Cardizem. If LV function reduced we'll change to beta blockade. He did not tolerate Toprol in the past but he did tolerate carvedilol.

## 2010-11-04 NOTE — Assessment & Plan Note (Signed)
Repeat echocardiogram. 

## 2010-11-04 NOTE — Assessment & Plan Note (Signed)
Blood pressure elevated. Increase lisinopril to 10 mg daily. Check potassium and renal function in one week. 

## 2010-11-04 NOTE — Patient Instructions (Signed)
Your physician has requested that you have an echocardiogram. Echocardiography is a painless test that uses sound waves to create images of your heart. It provides your doctor with information about the size and shape of your heart and how well your heart's chambers and valves are working. This procedure takes approximately one hour. There are no restrictions for this procedure.   Your physician wants you to follow-up in: ONE YEAR  You will receive a reminder letter in the mail two months in advance. If you don't receive a letter, please call our office to schedule the follow-up appointment.   INCREASE LISINOPRIL 10 MG ONCE DAILY  Your physician recommends that you return for lab work in: ONE WEEK  TRACK BP=130'S/85

## 2010-11-04 NOTE — Progress Notes (Signed)
[id number] male with history of paroxysmal atrial fibrillation returns for followup. The patient had a cardiac catheterization in 2004 that showed an ejection fraction of 45% and normal coronary arteries. He did have atrial fibrillation at that time and his LV function improved after  sinus rhythm was restored. He ultimately had atrial fibrillation ablation. He did well for several years. However in August of 2010 he was admitted to [facility] with recurrent atrial fibrillation. A TSH was normal. An echocardiogram performed on August 11 of 2010 showed an ejection fraction of 35-40%. There was biatrial enlargement. There was at least moderate mitral regurgitation. The patient subsequently had a transesophageal echocardiogram that showed an ejection fraction of 35% and moderate mitral regurgitation. He underwent cardioversion at that time as there was no left atrial appendage thrombus. He was seen in followup by Dr. Johney Frame and close followup was felt to be indicated. Possible attempt at another ablation could be considered in the future if his atrial fibrillation recurred. Atrial fibrillation recurred in Nov 2010. We therefore proceeded with a TEE guided cardioversion on June 08, 2009. His ejection fraction was 20-25%, mild mitral regurgitation, moderate to severe left atrial enlargement and mild to moderate right atrial enlargement. I last saw him in Jan 2011 and scheduled F/U echo to reassess LV function but he did not return. Since he was last seen,  the patient denies any dyspnea on exertion, orthopnea, PND, pedal edema, palpitations, syncope or chest pain.   Current Outpatient Prescriptions  Medication Sig Dispense Refill  . aspirin 81 MG tablet Take 81 mg by mouth daily.        Marland Kitchen diltiazem (TIAZAC) 360 MG 24 hr capsule Take 1 capsule (360 mg total) by mouth daily.  30 capsule  11  . DULoxetine (CYMBALTA) 30 MG capsule Take 30 mg by mouth daily.        Marland Kitchen lisinopril (PRINIVIL,ZESTRIL) 2.5 MG  tablet TAKE 1 TABLET BY MOUTH EVERY DAY  30 tablet  0  . montelukast (SINGULAIR) 10 MG tablet Take 10 mg by mouth at bedtime.        Marland Kitchen DISCONTD: carvedilol (COREG) 6.25 MG tablet Take 6.25 mg by mouth 2 (two) times daily with a meal.        . DISCONTD: warfarin (COUMADIN) 5 MG tablet Take 5 mg by mouth as directed.           Past Medical History  Diagnosis Date  . PAF (paroxysmal atrial fibrillation)   . HLD (hyperlipidemia)   . HTN (hypertension)   . Depression   . Allergic rhinitis   . NICM (nonischemic cardiomyopathy)     Past Surgical History  Procedure Date  . Atrial ablation surgery 2007    [facility]    History   Social History  . Marital Status: Married    Spouse Name: N/A    Number of Children: N/A  . Years of Education: N/A   Occupational History  . owns landscaping business    Social History Main Topics  . Smoking status: Never Smoker   . Smokeless tobacco: Not on file  . Alcohol Use: No  . Drug Use: No  . Sexually Active: Not on file   Other Topics Concern  . Not on file   Social History Narrative  . No narrative on file    ROS: no fevers or chills, productive cough, hemoptysis, dysphasia, odynophagia, melena, hematochezia, dysuria, hematuria, rash, seizure activity, orthopnea, PND, pedal edema, claudication. Remaining systems are negative.  Physical  Exam: Well-developed well-nourished in no acute distress.  Skin is warm and dry.  HEENT is normal.  Neck is supple. No thyromegaly.  Chest is clear to auscultation with normal expansion.  Cardiovascular exam is regular rate and rhythm.  Abdominal exam nontender or distended. No masses palpated. Extremities show no edema. neuro grossly intact  ECG Normal sinus rhythm at a rate of 75. Nonspecific ST changes.

## 2010-11-04 NOTE — Assessment & Plan Note (Signed)
Felt previously secondary to atrial fibrillation. Repeat echocardiogram with plan as outlined above.

## 2010-11-11 ENCOUNTER — Other Ambulatory Visit: Payer: BC Managed Care – PPO | Admitting: *Deleted

## 2010-11-11 ENCOUNTER — Other Ambulatory Visit (HOSPITAL_COMMUNITY): Payer: BC Managed Care – PPO | Admitting: Radiology

## 2010-11-13 ENCOUNTER — Telehealth: Payer: Self-pay | Admitting: *Deleted

## 2010-11-13 NOTE — Telephone Encounter (Signed)
Would continue aspirin and await echo results. [name]

## 2010-11-13 NOTE — Telephone Encounter (Addendum)
Spoke with pt, he called to report since going off the coumadin he is having severe leg pain. It does not occur during the day or with walking. The pain only occurs at night when he lies down. He has pain in his legs and a tingling feeling in his feet. He is afraid his blood is too thick and causing circulation problems in his legs. His legs have felt better the last two nights because he has been taking a lot of aspirin. Will forward for dr [name] review [name]

## 2010-11-13 NOTE — Telephone Encounter (Signed)
Pt left a message at dr Ludwig Clarks regarding leg pain. Left message for pt to call so we could discuss [name]

## 2010-11-14 NOTE — Telephone Encounter (Signed)
Left message for pt of dr crenshaw's recommendations Honour Schwieger  

## 2010-11-19 NOTE — Discharge Summary (Signed)
NAME:  [name], [name] NO.:  [id number]   MEDICAL RECORD NO.:  [id number]          PATIENT TYPE:  INP   LOCATION:  2002                         FACILITY:  [facility]   PHYSICIAN:  Madolyn Frieze. [name], MD, FACCDATE OF BIRTH:  [birth year]   DATE OF ADMISSION:  02/13/2009  DATE OF DISCHARGE:  02/15/2009                               DISCHARGE SUMMARY   PROCEDURES:  1. A 2-D echocardiogram.  2. Transesophageal echocardiogram.  3. Direct current cardioversion.  4. CT of the abdomen and pelvis with contrast.   PRIMARY FINAL DISCHARGE DIAGNOSIS:  Atrial fibrillation with rapid  ventricular response.   SECONDARY DIAGNOSIS:  1. Left ventricular dysfunction with an EF of 35-40%, moderate      globular hypokinesis, grade 2 diastolic dysfunction, and moderate      mitral regurgitation on echocardiogram.  2. Status post cardiac catheterization in 2004, showing normal      coronary arteries and an ejection fraction of 40-45%.  3. History of atrial fibrillation ablation in 2007, after failing      flecainide and sotalol with cardioversion.  4. Anticoagulation with Coumadin and Lovenox, started this admission.  5. Hypertension.  6. Hyperlipidemia.  7. Asthma.  8. Depression.  9. History of pseudoaneurysm after ablation requiring thrombin      injection.  10.Status post hernia repair.  11.Obesity with a body mass index of 31.3.   TIME AT DISCHARGE:  42 minutes.   HOSPITAL COURSE:  [name] is a 55 year old male with a history of  atrial fibrillation, status post ablation.  He had general malaise and  palpitations that started over 2 weeks ago.  On the day of admission, he  had lower abdominal pain and came to the emergency room.  He was found  to be in AFib RVR and was admitted for further evaluation.   He was initially rate controlled with IV Cardizem.  A CT of the abdomen  and pelvis with contrast was performed and did not show any acute  process.  Lipase was checked  and was within normal limits.  As he was  rate-controlled, his abdominal pain resolved.   Initially, he was started on heparin, but was interested in outpatient  anticoagulation, so this was changed to Lovenox.  He was started on  Coumadin.  An echocardiogram showed left ventricular dysfunction.  TSH  was within normal limits.  His Cardizem was changed to p.o. and  initially rate control was planned with outpatient EP evaluation.  Because of his left ventricular dysfunction, he was kept in the hospital  and a TEE cardioversion was performed.   TEE cardioversion was performed on February 14, 2009, with a bubble study.  His EF was 35% and the bubble study was negative.  He had direct current  cardioversion and was converted to sinus rhythm.  He tolerated the  procedure well.   After the patient was changed to Lovenox, the initial order was written  at 11:00 a.m., but the first dose was not given until 4:00 p.m.  The  following dose was given at 4:00 a.m.  However, because of an error  a  second Lovenox dose was given at 11:00 a.m. the same day.  Pharmacy is  aware and researching this to make sure appropriate action is taken.  Currently, the patient is asymptomatic and has no negative sequelae from  this.   On February 15, 2009, [name] is currently recovering from the  procedure.  Once he has recovered from the sedation, he is considered  stable for discharge with close outpatient followup.  It is hoped that  his left ventricular function will return to normal with him in sinus  rhythm.  The MR appears to have been due to annular dilatation and  hopefully will improve as well.  We will check a echocardiogram in 3  months to follow this.  [name] had issues with fatigue, secondary  to beta-blockade and this will not be used.  Dr. [name] evaluated Mr.  Carollee [name] on February 15, 2009, and he is considered stable for discharge  once he recovers from sedation.   DISCHARGE INSTRUCTIONS:   His activity level is to be increased  gradually.  He is encouraged to stick to a low-sodium, heart-healthy  diet.   FOLLOWUP:  He is to follow up with Dr. [name] in [facility] on  March 07, 2009, at 8:45.  He is to follow up with Dr. Johney Frame in  [place] on March 05, 2009, at 03:45.  He is to follow up with Dr.  Azucena Kuba as needed.  He has a [facility] appointment on Monday, February 19, 2009, at 09:45.   DISCHARGE MEDICATIONS:  Per the computerized discharge med list.      Theodore Demark, PA-C      Madolyn Frieze. [name], MD, [facility]  Electronically Signed    RB/MEDQ  D:  02/15/2009  T:  02/16/2009  Job:  [id number]   cc:   Rosanne Gutting

## 2010-11-19 NOTE — H&P (Signed)
NAME:  [name], [name] NO.:  [id number]   MEDICAL RECORD NO.:  [id number]          PATIENT TYPE:  INP   LOCATION:  2002                         FACILITY:  [facility]   PHYSICIAN:  Jesse Sans. Wall, MD, FACCDATE OF BIRTH:  [birth year]   DATE OF ADMISSION:  02/13/2009  DATE OF DISCHARGE:                              HISTORY & PHYSICAL   PRIMARY CARE PHYSICIAN:  Robert A. Nicholos Johns, MD   PRIMARY CARDIOLOGIST:  Madolyn Frieze. [name], MD, [facility]   CHIEF COMPLAINT:  AFib, RVR.   HISTORY OF PRESENT ILLNESS:  Mr. Morejon is a 55 year old male with a  history of paroxysmal atrial fibrillation.  He had onset of dyspnea on  exertion and weakness on January 26, 2009.  He left for [place] on January 27, 2009.  He was okay while he was on the cruise ship, but [place]  He became more weak and more short of  breath.  He was seen in [place] and treated medically, but not  cardioverted.  His symptoms improved with rate control.   Last p.m., he took his usual medications including aspirin and had  sudden onset of lower abdominal pain.  He does not think he took his  usual Cardizem 120.  His symptoms did not improve, so he came to the  emergency room at approximately 4 a.m.  He received pain medications for  his abdominal pain which improved his symptoms and greatly decreased his  pain.  He did not have nausea, vomiting, or diarrhea.  Because his heart  rate was in the 40s, he was started on the Cardizem drip.  Currently,  the Cardizem drip is at 20 mg an hour.  His heart rate is in the lateral  110s.  With this abdominal pain improved, he is resting comfortably.   PAST MEDICAL HISTORY:  1. Status post cardiac catheterization on April 14, 2003, showing      normal coronary arteries and EF of 40-45%.  2. Status post echocardiogram on April 12, 2003, showing an EF of 35-      45% with no regional wall motion abnormalities and mild MR.  3. Atrial fibrillation,  status post direct current cardioversion x3      and status post ablation in 2007.  4. Anticoagulation with Coumadin, discontinued in appropriate interval      after his ablation.  5. Hypertension.  6. Hyperlipidemia.  7. Asthma.  8. Depression.  9. History of pseudoaneurysm after AFib ablation requiring a thrombin      injection.   SURGICAL HISTORY:  He is status post AFib ablation as well as hernia  repair, direct current cardioversion x3, and cardiac catheterization.   ALLERGIES:  No known drug allergies.   CURRENT MEDICATIONS:  1. Lisinopril.  2. Singulair.  3. Cymbalta.  4. Cardizem LA 120 mg a daily.  5. Aspirin 325 mg daily.   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  He has no history of alcohol, tobacco, or drug  abuse.  He states that  his diet was poor while he was traveling and he  limits his caffeine to 1 [facility] daily.   FAMILY HISTORY:  His father died at age 21.  His mother died at age 12  and neither of his parents nor any siblings have any history of coronary  artery disease or heart rhythm problems.   REVIEW OF SYSTEMS:  He has not had fevers, chills, or sweats.  He  occasionally has reflux symptoms and the abdominal pain that brought him  to the hospital, he has had before, but never this severe.  He has been  taking promethazine for it and because of that he has had some  constipation.  He had a broken ankle right before his cruise in mid July  2010 and it has improved, but not completely healed.  He has had  orthopnea, but denies PND or edema, although he states he was placed on  Lasix in [place].  He is aware of his irregular heart beat and feels that  his heart went out on January 26, 2009, and has not been back in rhythm  since then.  Of note, he states his previous episodes of AFib all  required cardioversion as he has never spontaneously converted.  Full 14-  point review of systems is otherwise  negative.   PHYSICAL EXAMINATION:  VITAL SIGNS:  Temperature is 96.9; blood pressure  141/114; initial heart rate 142, now 108; respiratory rate 18; O2  saturation 97% on room air.  GENERAL:  He is a well-developed, well-nourished white male in no acute  distress.  HEENT:  Normal.  NECK:  There is no lymphadenopathy, thyromegaly, bruit, or JVD noted.  CV:  His heart is irregular in rate and rhythm with an S1 and S2 and no  significant murmur, rub, or gallop is noted.  Distal pulses are intact  in all 4 extremities.  LUNGS:  He has a slight expiratory wheeze in the bases, but no crackles  or rhonchi are noted.  SKIN:  No rashes or lesions are noted.  ABDOMEN:  Soft and nontender with active bowel sounds.  EXTREMITIES:  There is no cyanosis, clubbing, or edema noted.  MUSCULOSKELETAL:  There is no joint deformity or effusions and no spine  or CVA tenderness.  NEURO:  He is alert and oriented.  Cranial nerves II-XII grossly intact.   CT of the abdomen and pelvis with contrast shows minimal pancreatic  haziness, suspicious for mild pancreatitis.  Small dependent bilateral  pleural effusions are noted posteriorly with minimal associated  bibasilar atelectasis.   Chest x-ray shows an ill-defined right lower lobe air space opacity,  atelectasis versus pneumonia.   EKG, AFib, RVR, rate 138 with no acute ischemic changes.   LABORATORY VALUES:  Hemoglobin 16.2, hematocrit 47.9, WBCs 15.1, and  platelets 258.  Sodium 140, potassium 4.0, chloride 103, CO2 of 28, BUN  10, creatinine 1.22, glucose 98, lipase 49, lactic acid 1.2.  Point-of-  care markers negative x1.  Urinalysis negative.   IMPRESSION:  Mr. Hudnall was seen today by Dr. Daleen Squibb.  The control of his  atrial fibrillation is improved on the IV Cardizem.  We will continue  this for now.  He had an ablation at [facility] 3 years ago.  We will consult  EP as he is interested in another ablation and because he did not  maintain sinus rhythm  despite being loaded with sotalol is 2005 prior to  cardioversion and flecainide in 2006 prior to cardioversion.  He will be  started on IV heparin and Coumadin.  We will continue the Cardizem IV  for rate control and possibly changed to p.o. if his rate improved in  a.m.  We will check a TSH and an echo.   Abdominal pain:  CT showed no acute process.  We will check an amylase  as the lipase is within normal limits.  His white count is mildly  elevated, but no fever.  No antibiotics will be used at this time and we  will recheck a CBC in a.m.  He will be started on clear liquids and the  diet will be advanced as tolerated.      Theodore Demark, PA-C      Jesse Sans. Daleen Squibb, MD, [facility]  Electronically Signed    RB/MEDQ  D:  02/13/2009  T:  02/13/2009  Job:  [id number]

## 2010-11-19 NOTE — Consult Note (Signed)
NAME:  [name], [name] NO.:  [id number]   MEDICAL RECORD NO.:  [id number]          PATIENT TYPE:  EMS   LOCATION:  [facility]                         FACILITY:  [facility]   PHYSICIAN:  Bevelyn Buckles. Bensimhon, MDDATE OF BIRTH:  06/29/1956   DATE OF CONSULTATION:  02/17/2009  DATE OF DISCHARGE:                                 CONSULTATION   REQUESTING PHYSICIAN:  [facility].   PRIMARY CARDIOLOGIST:  Madolyn Frieze. Crenshaw, MD.   REASON FOR CONSULTATION:  Right thigh pain status post cardioversion.   HISTORY OF PRESENT ILLNESS:  [name] is a very pleasant 55 year old  male with a history of atrial fibrillation status post atrial  fibrillation ablation in 2007.  He also has hypertension,  hyperlipidemia, and moderate nonischemic cardiomyopathy with an EF of 35  to 40%.  He has had normal coronaries by catheterization in 2007.   He was admitted on August 10th with recurrent atrial fibrillation  despite his previous atrial fibrillation ablation.  He underwent a TEE-  guided cardioversion.  TEE showed an EF of 35%.  He had a successful  cardioversion and was discharged home.  While in the hospital, he did  have a CT scan of the abdomen and pelvis due to pain.  This showed  minimal peripancreatic stranding with a question of mild pancreatitis.  However, his pain resolved and was discharged home.   He tells me he has a greater than 30-month history of severe leg pain.  He has never had a formal workup for this in the past.  He was told that  it might be due to hypokalemia.  He has previously stopped Statin drugs  due to worsening of this pain.  He said last night he developed severe  cramping pain in his right thigh.  This persisted all night and he  finally decided to come to the emergency room.  He has not noticed any  swelling of the leg, he has not had any shortness of breath, he has not  noticed any discoloration in the leg.  He says it is very similar to his  previous  pain, just a lot worse.  There has been no trauma.   REVIEW OF SYSTEMS:  He denies any bleeding, no fevers, no chills.  No  seizure activity, no focal neurologic deficits.  He has not had any  weakness in the leg.  He does say he recently hurt his left ankle and  has been favoring his right leg some and he wonders if that is  responsible.  On remainder of review of systems, all systems negative  except for HPI and Problem List.   PROBLEM LIST:  1. A history of atrial fibrillation status post recent cardioversion.      a.     Status post atrial fibrillation in 2007 after failing       flecainide and sotalol.  2. Hypertension.  3. Hyperlipidemia.  4. Nonischemic cardiomyopathy with an EF of 35 to 40%.      a.     Coronary arteries normal by catheterization in 2007.  5. Asthma.  6. Depression.  7. Obesity.   MEDICATIONS:  Include lisinopril, Singulair, Cymbalta, Cardizem-LA, and  aspirin.  He is also on Coumadin and a Lovenox bridge.   ALLERGIES:  No known drug allergies.   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  He denies any alcohol, tobacco, or drug use.   FAMILY HISTORY:  Father died at age 87.  Mother died at age 15.  No  known coronary artery disease.   PHYSICAL EXAMINATION:  GENERAL:  He is very uncomfortable due to his leg  pain.  VITAL SIGNS:  Blood pressure is 140/100, heart rate is 99, he is sating  98% on room air.  HEENT:  Normal.  NECK:  Supple, there is no JVD, no lymphadenopathy, or thyromegaly.  CARDIAC:  PMI is nondisplaced.  He is regular with no murmurs, rubs, or  gallops.  LUNGS:  Clear.  ABDOMEN:  Obese, nontender, and nondistended, no hepatosplenomegaly, no  bruits, no masses.  EXTREMITIES:  Warm with no cyanosis, clubbing, or  edema.  His right femoral pulse is 2+.  There is no bruit there.  He has  got good pulses throughout both legs.  There is good capillary refill  distally of the right leg.  There  is no swelling.  There is no  ecchymosis.  There is good strength throughout the right leg.  There is  no tenderness to palpation of the muscle.  NEURO:  Alert and oriented x3, cranial nerves II-XII grossly intact, he  moves all 4 extremities without difficulty, affect is pleasant.   ASSESSMENT:  1. Acute on chronic right thigh pain.  2. History of paroxysmal atrial fibrillation status post recent      cardioversion on February 14, 2009.  3. Nonischemic cardiomyopathy with an ejection fraction of 35 to 40%.   PLAN/DISCUSSION:  I do not see any evidence of embolic phenomenon to  explain his right leg pain.  There is a possibility that this could be  due to a spontaneous thigh bleed due to his anticoagulation, but the  pattern is not consistent with this and there is no evidence of bleeding  on exam.  I suspect his pain is musculoskeletal.  This will be followed  up by the emergency room doctors and we can get the Medicine Service  involved as needed.  Please do not hesitate to call us with any further  questions.      Bevelyn Buckles. Bensimhon, MD  Electronically Signed     DRB/MEDQ  D:  02/17/2009  T:  02/17/2009  Job:  [id number]

## 2010-11-20 ENCOUNTER — Emergency Department (HOSPITAL_COMMUNITY)
Admission: EM | Admit: 2010-11-20 | Discharge: 2010-11-20 | Discharge status: Against Medical Advice | Payer: BC Managed Care – PPO | Attending: Emergency Medicine | Admitting: Emergency Medicine

## 2010-11-20 ENCOUNTER — Other Ambulatory Visit: Payer: BC Managed Care – PPO | Admitting: *Deleted

## 2010-11-20 ENCOUNTER — Other Ambulatory Visit (HOSPITAL_COMMUNITY): Payer: BC Managed Care – PPO | Admitting: Radiology

## 2010-11-20 DIAGNOSIS — I10 Essential (primary) hypertension: Secondary | ICD-10-CM | POA: Insufficient documentation

## 2010-11-20 DIAGNOSIS — M545 Low back pain, unspecified: Secondary | ICD-10-CM | POA: Insufficient documentation

## 2010-11-20 DIAGNOSIS — M79609 Pain in unspecified limb: Secondary | ICD-10-CM | POA: Insufficient documentation

## 2010-11-20 DIAGNOSIS — F3289 Other specified depressive episodes: Secondary | ICD-10-CM | POA: Insufficient documentation

## 2010-11-20 DIAGNOSIS — Z79899 Other long term (current) drug therapy: Secondary | ICD-10-CM | POA: Insufficient documentation

## 2010-11-20 DIAGNOSIS — F329 Major depressive disorder, single episode, unspecified: Secondary | ICD-10-CM | POA: Insufficient documentation

## 2010-11-21 ENCOUNTER — Telehealth: Payer: Self-pay | Admitting: Radiology

## 2010-11-21 ENCOUNTER — Telehealth (HOSPITAL_COMMUNITY): Payer: Self-pay | Admitting: Radiology

## 2010-11-21 NOTE — Telephone Encounter (Signed)
No phone call made. Entered in error.

## 2010-11-22 NOTE — Consult Note (Signed)
NAME:  [name], [name]                       ACCOUNT NO.:  [id number]   MEDICAL RECORD NO.:  [id number]                   PATIENT TYPE:  INP   LOCATION:  0157                                 FACILITY:  [facility]   PHYSICIAN:  Armanda Magic, M.D.                  DATE OF BIRTH:  1955-09-11   DATE OF CONSULTATION:  04/12/2003  DATE OF DISCHARGE:                                   CONSULTATION   REFERRING PHYSICIAN:  Elana Alm. Nicholos Johns, M.D.   CHIEF COMPLAINT:  Atrial fibrillation and shortness of breath.   This is a very pleasant 55 year old white male with a history of asthma who  has been experiencing symptoms of nocturnal shortness of breath associated  with insomnia for about three weeks.  He saw his pulmonologist, Dr. Sherene Sires,  because he thought he might be having some problems with asthma.  He was  seen on the fifth and was noted to have irregular heartbeat.  EKG showed new-  onset atrial fibrillation.  He did not want to be admitted and was sent home  on Toprol-XL, aspirin, and Xanax.  Unfortunately, the patient's symptoms  increased.  He started having headaches.  He was taking apparently the Xanax  around-the-clock and then started developing nausea and dizziness which he  though was from the Xanax.  He was evaluated by his primary doctor, Dr.  Nicholos Johns, yesterday, and was admitted to ICU to rule out ischemia because he  was having some diaphoresis as well.   PAST MEDICAL HISTORY:  1. Asthma, on Singulair 10 mg a day.  2. Episodes of musculoskeletal lower extremity pain and poison ivy     dermatitis.  3. Borderline hypertension.   PAST SURGICAL HISTORY:  Left inguinal hernia repair.   SOCIAL HISTORY:  He denies tobacco or alcohol use, no recreational drugs.  He is married and lives in [place].  He is self-employed with  Aeronautical engineer.  He is a heavy caffeine user as well as Sudafed user.   FAMILY HISTORY:  His mother had asthma, breast cancer, and lymphoma.  His  father was a  smoker and died of COPD at 71.  He has a son with asthma.   REVIEW OF SYSTEMS:  He complains of fatigue.   ALLERGIES:  None.   PHYSICAL EXAMINATION:  VITAL SIGNS:  His blood pressure is 135/100, heart  rate 80-115, respirations 20, afebrile.  GENERAL:  This is a well-developed, well-nourished male in no acute  distress.  HEENT:  Benign.  NECK:  Supple without lymphadenopathy.  Carotid upstrokes +2 bilaterally, no  bruits.  LUNGS:  Clear to auscultation throughout.  HEART:  Irregularly irregular.  No murmurs, rubs, or gallops.  Normal S1,  S2.  ABDOMEN:  Soft, nontender, nondistended, with active bowel sounds.  No  hepatosplenomegaly.  EXTREMITIES:  No cyanosis, erythema, or edema; +2 dorsalis pedis pulses  bilaterally.  NEUROLOGIC:  He is alert  and oriented x3, grossly intact.   LABORATORY DATA:  CPK 299, 207; MB 5.1 and 4.5; troponin 0.03 and 0.01;  relative indexes are all normal.  LFTs are normal.  Sodium 142, potassium  4.6, chloride 105, bicarb 30, BUN 22, creatinine 1.4, glucose 109.  Hemoglobin 17, hematocrit 47.9, platelet count 289, white cell count 10.7.  EKG shows normal sinus rhythm with atrial fibrillation, no ST-T wave  abnormalities are noted.  Chest x-ray showed mild vascular prominence  without evidence of CHF, mild cardiomegaly.   ASSESSMENT:  1. New-onset atrial fibrillation, currently fairly well rate controlled with     occasional increases in heart rate up into the 110s to 120s.  He is     currently on IV fluids.  He was started recently on IV Cardizem drip at 5     mg/hour.  Preliminary 2-D echocardiogram report shows low-normal LV     systolic function, EF 45-50%, no valvular abnormalities, no notable     regional wall motion abnormalities.  He does have moderate left atrial     enlargement and a little bit of MR.  EKG shows no inducible ischemia.     His cardiac enzymes, CPKs and MBs are elevated slightly but troponins and     relative index are normal.   He is currently beta blockers, aspirin, and     Lovenox, as well as IV Cardizem.  2. Hypertension with slightly elevated blood pressure on exam today.  I have     a feeling he has probably had some moderate elevations of blood pressure     for some time now, given his left atrial enlargement on his 2-D     echocardiogram.   PLAN:  1. Would continue beta blockers and aspirin and Lovenox for now.  2. Would get a stress Cardiolite study in the morning to rule out inducible     ischemia.  If this is negative would go ahead and switch him over to     Coumadin.  3. Add digoxin 0.25 mg IV q.6h. x1 mg, then 0.25 mg a day p.o.  4. Discontinue Cardizem drip and to continue on beta blockers.  5. Spiral chest CT today to rule out pulmonary embolus given his dyspnea at     night and new-onset atrial fibrillation.  6. I have counseled him on not using any type of over-the-counter     decongestants any more and cut down on his caffeine intake.                                               Armanda Magic, M.D.    TT/MEDQ  D:  04/12/2003  T:  04/12/2003  Job:  [id number]   cc:   Molly Maduro A. Nicholos Johns, M.D.  [address]  [place]  [place] [address]  Fax: [phone]

## 2010-11-22 NOTE — Op Note (Signed)
NAME:  [name], [name] NO.:  [id number]   MEDICAL RECORD NO.:  [id number]          PATIENT TYPE:  OIB   LOCATION:  2899                         FACILITY:  [facility]   PHYSICIAN:  Armanda Magic, M.D.     DATE OF BIRTH:  10/19/1955   DATE OF PROCEDURE:  07/16/2004  DATE OF DISCHARGE:                                 OPERATIVE REPORT   REFERRING PHYSICIAN:  Dr. Fulton Mole.   This is a 55 year old white male with a history of paroxysmal atrial  fibrillation, hyperlipidemia, and nonischemic cardiomyopathy secondary to  tachycardia who presents now with recurrent atrial fibrillation after being  in a hot tub.  His INR has been therapeutic.  He now presents for direct  current cardioversion.   The patient was brought to the day hospital in a fasting, nonsedated state.  Informed consent was obtained.  The patient was connected to continuous  heart rate and pulse oximetry monitoring and intermittent blood pressure  monitoring.  Defibrillation pads were placed on the anterior and posterior  chest and then anesthesia gave 250 mg of __________ .  After adequate  anesthesia was obtained, a synchronized biphasic 75 joules shock was  delivered which was unsuccessful in converting to sinus rhythm.  A  synchronized 100 joules biphasic shock was delivered which subsequently  converted the patient to normal sinus rhythm.  The patient tolerated the  procedure well without complications.   ASSESSMENT:  1.  Recurrent atrial fibrillation.  2.  Systemic anticoagulation with therapeutic INR.  3.  Successful cardioversion to normal sinus rhythm.   PLAN:  Discharge to home after fully awake and follow up with me in 2 weeks.      TT/MEDQ  D:  07/16/2004  T:  07/16/2004  Job:  [id number]   cc:   Fulton Mole, M.D.

## 2010-11-22 NOTE — Op Note (Signed)
NAME:  [name], [name] NO.:  [id number]   MEDICAL RECORD NO.:  [id number]          PATIENT TYPE:  OIB   LOCATION:  2861                         FACILITY:  [facility]   PHYSICIAN:  Charlton Haws, M.D.     DATE OF BIRTH:  [birth year]   DATE OF PROCEDURE:  03/12/2005  DATE OF DISCHARGE:                                 OPERATIVE REPORT   PROCEDURE:  Cardioversion.   [name] is a 55 year old patient of Dr. [name].  He has atrial  fibrillation.  He was started on flecainide a week ago.  He has had a  therapeutic INR for over three weeks, and his INR this morning was 4.2.  He  was anesthetized with 450 mg of sodium Pentothal.  A single 200-joule  biphasic shock was delivered.  He converted to normal sinus rhythm.   IMPRESSION:  Successful direct current cardioversion on therapeutic  anticoagulation, the patient hemodynamically stable without immediate  neurologic sequelae, to follow up with Dr. [name] next week.           ______________________________  Charlton Haws, M.D.     PN/MEDQ  D:  03/12/2005  T:  03/12/2005  Job:  [id number]   cc:   [name], M.D. [facility]  [address]  [address]  [place]  [place] [address]

## 2010-11-22 NOTE — Op Note (Signed)
NAME:  [name], [name]                       ACCOUNT NO.:  [id number]   MEDICAL RECORD NO.:  [id number]                   PATIENT TYPE:  OIB   LOCATION:  2857                                 FACILITY:  [facility]   PHYSICIAN:  Armanda Magic, M.D.                  DATE OF BIRTH:  19-Oct-1955   DATE OF PROCEDURE:  07/28/2003  DATE OF DISCHARGE:                                 OPERATIVE REPORT   REFERRING PHYSICIAN:  Fulton Mole, M.D.   PROCEDURE:  Direct current cardioversion.   OPERATOR:  Armanda Magic, M.D.   INDICATIONS FOR PROCEDURE:  Atrial fibrillation.   COMPLICATIONS:  None.   INTRAVENOUS MEDICATIONS:  Pentothal 350 mg.   This is a very pleasant 55 year old white male who was initially seen by me  in October with new onset atrial fibrillation in association with a presumed  tachycardia related cardiomyopathy, EF 40-45%.  He underwent cardiac  catheterization which showed normal coronary arteries and mild LV  dysfunction.  It was felt that, most likely, this was tachycardia induced  cardiomyopathy.  He was rate controlled, placed on Coumadin, and it took  awhile for his Coumadin to be therapeutic, for four weeks, it finally was  and we are now bringing him into the hospital for direct current  cardioversion.   The patient was brought to the day hospital in a fasting nonsedated state.  Informed consent was obtained.  The patient was connected to continuous  heart rate, pulse oximetry monitoring, intermittent blood pressure  monitoring.  After adequate anesthesia was obtained with Pentothal 350 mg  IV, a 75 joule synchronic biphasic shock was delivered which was  unsuccessful in converting the patient to normal sinus rhythm.  After  insuring the patient was still under anesthesia, a 150 joule synchronized  biphasic shock was delivered which successfully converted the patient to  normal sinus rhythm.  The patient tolerated the procedure well without any  complications.   ASSESSMENT:  1. Atrial fibrillation with successful direct current cardioversion and     normal sinus rhythm.  2. Mild left ventricular  dysfunction with normal coronary arteries.  3. Systemic anticoagulation.   PLAN:  1. Discharge to home after awake and fully ambulatory.  2. Follow up with Dr. Mayford Knife in two weeks.  3. Continue Coumadin and all other medications.                                               Armanda Magic, M.D.   TT/MEDQ  D:  07/28/2003  T:  07/28/2003  Job:  [id number]

## 2010-11-22 NOTE — Assessment & Plan Note (Signed)
[facility]                              CARDIOLOGY OFFICE NOTE   NAME:Alioto, JAMARIE MUSSA                    MRN:          [id number]  DATE:01/28/2006                            DOB:          07-20-1955    Mr. Meng returns for a followup today as an add on.  He is a gentleman  who has a history of paroxysmal atrial fibrillation, hypertension, and  hyperlipidemia.  He has been doing reasonably well recently.  There is some  fatigue, but no significant dyspnea on exertion, orthopnea, PND, pedal  edema, palpitations, presyncope, syncope, or chest pain.  He has seen Dr.  Elvera Lennox at [facility], and his atrial fibrillation ablation has been  scheduled for September.  Beginning Monday of this week, the patient is  complaining of nausea and diarrhea.  He has not had any fevers or chills,  nor is there any productive cough.  He also feels more short of breath,  predominantly when he is laying down at night.  He also complains of mouth  dryness at that same time.   MEDICATIONS:  At present include:  1.  Cardia 360 mg p.o. daily.  2.  Warfarin.  3.  Sodium as directed.  4.  AcipHex 20 mg p.o. b.i.d.  5.  Hydrocodone as needed for leg pain.   PHYSICAL EXAMINATION:  VITAL SIGNS:  Blood pressure 148/98, pulse is 105.  He weighs 259 pounds.  NECK:  Supple, with no bruits.  CHEST:  Clear.  CARDIOVASCULAR:  Regular rate and rhythm.  EXTREMITIES:  No edema.   Electrocardiogram shows atrial fibrillation at rate of 89.  The axis is  normal.  There is no RV conduction delay and there are nonspecific ST  changes.   DIAGNOSES:  1.  Atrial fibrillation.  2.  Coumadin therapy.  3.  Hypertension.  4.  Hyperlipidemia.   PLAN:  Mr. Bentsen is complaining of mild dyspnea of uncertain etiology.  He  is not volume overloaded on exam today and his heart rate appears to be in  the high normal range.  We will increase his Cardizem to 480 mg p.o. daily.  If  his symptoms persist despite increased Cardizem, then we may add low-dose  diuretic.  I will check a B-met and a BNP today to help guide our therapy.  He is complaining of diarrhea and nausea which may be a viral infection.  He  will continue to follow this and we can work it up further if it persists.  He again is scheduled for his atrial fibrillation ablation at [facility] in  September.  He has not had has PT checked in the past  one month, and I have stressed the importance of following his Coumadin  levels.  He will get his PT checked this week.  I will see him back in 4  weeks.                              Madolyn Frieze [name], MD, [facility]    BSC/MedQ  DD:  01/28/2006  DT:  01/28/2006  Job #:  [id number]

## 2010-11-22 NOTE — Assessment & Plan Note (Signed)
[facility]                              CARDIOLOGY OFFICE NOTE   NAME:[name], [name]                    MRN:          [id number]  DATE:03/23/2006                            DOB:          11-13-1955    [name] is a gentleman I follow for atrial fibrillation.  He had atrial  fibrillation ablation at [facility] last week.  He presented to the  C[facility]madin Clinic to have his INR checked today.  He is complaining of left  groin pain and I did evaluate his leg.  There was some ecchymosis.  There  was also a fairly loud bruit.  It was otherwise unremarkable.  He has not  had chest pain or shortness of breath.  We have scheduled him for an  ultrasound of his left groin to exclude pseudoaneurysm or AV fistula  tomorrow.  He was seen at 5:10 p.m. as an add-on.                              Madolyn Frieze [name], MD, [facility]    BSC/MedQ  DD:  03/23/2006  DT:  03/24/2006  Job #:  [id number]

## 2010-11-22 NOTE — Assessment & Plan Note (Signed)
[facility]                              CARDIOLOGY OFFICE NOTE   NAME:[name]                    MRN:          [id number]  DATE:04/22/2006                            DOB:          [birth year]    [name] returns for followup today.  He was recently seen at [facility] and underwent atrial fibrillation ablation.  I did see him in  followup for pain in his left groin, and he was found to have a  pseudoaneurysm which was injected with thrombin.  Since that time he has  done well.  He denies any dyspnea, chest pain, palpitations or syncope.   MEDICATIONS:  1. Coumadin as directed.  2. Cardizem 240 mg p.o. daily.  3. Tikosyn 0.5 mg p.o. b.i.d.   PHYSICAL EXAMINATION:  VITAL SIGNS:  Shows a blood pressure of 133/96, and  his pulse is 79.  NECK:  Supple.  CHEST:  Clear.  CARDIOVASCULAR:  Exam reveals a regular rate and rhythm.  GU:  His left groin shows no hematoma, no bruit.  EXTREMITIES:  Show no edema.   Electrocardiogram shows a sinus rhythm with a normal axis.  There is a right  bundle branch block.  His QT is not prolonged.   DIAGNOSES:  1. Atrial fibrillation, status post ablation at [facility].  2. Coumadin therapy.  3. Hypertension.  4. History of hyperlipidemia.   PLAN:  [name] is doing well from a symptomatic standpoint.  He remains  in sinus rhythm, and he continues on Coumadin with a normal QT.  He is to  see Dr. Berneice Gandy in November.  At that time if he remains in sinus rhythm,  he understands his Tikosyn will be discontinued.  I have also asked him to  discuss the duration of Coumadin therapy.  His blood pressure is elevated  today, but he is under a significant amount of stress from business issues  today.  He will track this, and if it remains elevated then we will increase  his medications as indicated.  He will see [place]  back in December.  If he is doing well at that time, then we will decrease  the frequency of his visits.            ______________________________  Madolyn Frieze [name], MD, [facility]     BSC/MedQ  DD:  04/22/2006  DT:  04/24/2006  Job #:  [id number]

## 2010-11-22 NOTE — H&P (Signed)
NAME:  [name], [name] NO.:  [id number]   MEDICAL RECORD NO.:  [id number]                   PATIENT TYPE:  EMS   LOCATION:  ED                                   FACILITY:  [facility]   PHYSICIAN:  Elana Alm. Nicholos Johns, M.D.               DATE OF BIRTH:  [birth year]   DATE OF ADMISSION:  04/11/2003  DATE OF DISCHARGE:                                HISTORY & PHYSICAL   CHIEF COMPLAINT:  Nausea, vomiting, diaphoresis and lightheadedness.   HISTORY OF PRESENT ILLNESS:  This 55 year old white male who is well known  to me presented with complaints of shortness of breath while going to sleep  and resulting insomnia over the last week. Because of the associated fatigue  with his insomnia, the patient began consuming a large amount of Sudafed and  caffeine during the week as well. Because the patient has known history of  asthma, he determined that his shortness of breath may be asthma related and  elected to see Dr. Sherene Sires of pulmonary service yesterday for evaluation. Exam  and EKG confirmed diagnosis of new onset atrial fibrillation. The patient  was sent out on Toprol XL 50 mg, 1 p.o. daily, ASA 81 mg, p.o. daily and  Xanax 0.5 mg, 1 mg p.o. q.i.d. as needed for anxiety.   The patient initially did well on this regime until noon today when he began  experiencing mild bitemporal headache and nausea. The patient took half a  Xanax at 3 p.m. and the headache and nausea seemed to worsen associated with  dizziness/lightheadedness. The patient's symptoms remained unchanged until  he elected to contact me at approximately 9 p.m.  I elected to see the  patient for evaluation in my office and vital signs there showed a pulse  rate of approximately 72 and irregular and blood pressure of 128/82. The  patient was experiencing dry heaves and diaphoresis and I advised that he go  to the emergency room for evaluation to rule out ischemia.   PAST MEDICAL HISTORY:  1. Asthma on  Singulair 10 mg, 1 p.o. q.p.m.  2. Episodic musculoskeletal back pain.  3. Poison ivy dermatitis in the past.   PAST SURGICAL HISTORY:  1. Left inguinal hernia repair in the past.   SOCIAL HISTORY:  The patient denies use of tobacco, alcohol or recreational  drugs. 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 The patient admits to  heavy caffeine use and use of decongestants in the last week.   FAMILY HISTORY:  The patient's mother is an asthmatic, is a survivor of  breast cancer and currently has lymphoma. The patient's father smokes  cigarettes and died of COPD related complications at age 58. The patient's  sister has asthma.   REVIEW OF SYMPTOMS:  GENERAL:  The patient complains of fatigue. LUNGS:  Denies any shortness of breath. HEART:  Denies any palpitations or chest  pain. ABDOMEN:  Denied any bloody or tarry stools, denied any constipation  or diarrhea. GU:  Denied any urinary frequency, urgency or dysuria.  NEUROLOGIC:  Denied any neurological symptoms. Review of symptoms otherwise  was completely negative.   ALLERGIES:  No known drug allergies.   PHYSICAL EXAMINATION:  VITAL SIGNS:  Temperature 98.6, blood pressure  133/91, pulse 94, respirations 20, O2 sat 97% on room air.  GENERAL:  The patient appeared to be alert and oriented x4 in no acute  distress with the exception of mild anxiety. The patient did show evidence  of mild diaphoresis.  HEENT:  Head normocephalic, atraumatic. Eyes PERRL.  EOMI intact, fundi  benign. Ears, tympanic membranes and canals clear. Nose clear, throat  negative.  NECK:  Supple, full range of motion without thyromegaly. No adenopathy  noted.  LUNGS:  Clear to auscultation with good inspiratory and expiratory effort  noted.  HEART:  Irregularly irregular rhythm without evidence of gallops, rubs or  murmurs.  ABDOMEN:  Soft, nontender, nondistended with normal  bowel sounds. No masses  or organomegaly appreciated. No rebound or guarding noted.  GU/RECTAL:  Not indicated for this hospitalization.  EXTREMITIES:  No cyanosis, clubbing or edema present. Skin without rashes or  lesions present.  NEUROLOGIC:  Cranial nerves II-XII intact. Gross motor sensory intact.  Cerebellar intact. DTRs +2/+4 bilaterally.   LABORATORY DATA:  1. CBC showed mildly elevated WBC count of 10.7, H&H was 17.0 and 47.9,     platelet count 289,000.  2. CMET was within normal limits.  3. CK total 299 elevated with mild elevation of MB band of 5.1 with index     within normal range of 1.7. Troponin I within normal limits at 0.03.  4. EKG showed atrial fibrillation with mildly increased ventricular response     of 103.  5. Chest x-ray showed no acute changes or disease.   IMPRESSION:  1. New onset atrial fibrillation, rule out MI.  2. Asthma controlled.   PLAN:  Case discussed with Dr. Effie Shy. Dr. Effie Shy recommends that patient  be admitted to follow every 6 hour cardiac enzymes and EKG, echocardiogram  advice in the morning along with ETT is needed. Lovenox and aspirin started.  The patient continued on  Singulair and also will be continued on Beta  blockers using Lopressor. Followup with [facility] in the morning.                                               Robert A. Nicholos Johns, M.D.    RAR/MEDQ  D:  04/12/2003  T:  04/12/2003  Job:  [id number]

## 2010-11-22 NOTE — Op Note (Signed)
NAME:  [name], MCMAINS NO.:  [id number]   MEDICAL RECORD NO.:  [id number]          PATIENT TYPE:  OIB   LOCATION:  2899                         FACILITY:  [facility]   PHYSICIAN:  Armanda Magic, M.D.     DATE OF BIRTH:  Sep 06, 1955   DATE OF PROCEDURE:  12/06/2004  DATE OF DISCHARGE:                                 OPERATIVE REPORT   REFERRING PHYSICIAN:  Molly Maduro A. Nicholos Johns, M.D.   This is a 55 year old male with a history of paroxysmal atrial fibrillation  status post cardioversion in the past, now with recurrent atrial  fibrillation, status post sotalol loading and now presents for  cardioversion.   DESCRIPTION OF PROCEDURE:  The patient was brought to the day hospital the  fasting nonsedated state.  Informed consent was obtained.  The patient was  connected to continuous heart rate and pulse oximeter monitoring,  intermittent blood pressure monitoring.  Defibrillation pads were placed on  the anterior left chest and then the back and 250 mg of sodium pentothal was  administered by anesthesia.  After adequate anesthesia was obtained, a 75  joule synchronized biphasic shock was delivered which was unsuccessful in  converting the patient to sinus rhythm.  Then a 150 joule synchronized  biphasic shock was delivered which successfully converted the patient to  sinus rhythm.  The patient tolerated the procedure well without  complication.   ASSESSMENT:  1.  Atrial fibrillation status post sotalol load.  2.  Systemic anticoagulation with therapeutic INR.  3.  Successful DC cardioversion to normal sinus rhythm.   PLAN:  Discharge him after fully awake.  Follow-up with me in one week.       TT/MEDQ  D:  12/06/2004  T:  12/06/2004  Job:  [id number]   cc:   Molly Maduro A. Nicholos Johns, M.D.  [address]  [place]  [place] [address]  Fax: [phone]

## 2010-11-22 NOTE — Discharge Summary (Signed)
NAME:  [name], [name]             ACCOUNT NO.:  [id number]   MEDICAL RECORD NO.:  [id number]          PATIENT TYPE:  INP   LOCATION:  3737                         FACILITY:  [facility]   PHYSICIAN:  Armanda Magic, M.D.     DATE OF BIRTH:  04-15-1956   DATE OF ADMISSION:  11/25/2004  DATE OF DISCHARGE:  11/28/2004                                 DISCHARGE SUMMARY   ADMISSION DIAGNOSES:  1.  Paroxysmal atrial fibrillation.  2.  Nonischemic cardiomyopathy.  3.  Hyperlipidemia.   DISCHARGE DIAGNOSES:  1.  Paroxysmal atrial fibrillation, status post sotalol drug load.  2.  Hypertension, moderate control.  3.  Bradycardia, improved.   PROCEDURES:  None.   DISCHARGE STATUS:  Stable.   HISTORY OF PRESENT ILLNESS:  Please see complete H&P for details, but in  short this is a 55 year old male with known PAF. He underwent successful  cardioversion several months prior to this discharge. He, however, reverted  back to atrial fibrillation after receiving a shock by a spark plug on some  lawn equipment. Hence, he was admitted for drug loading with sotalol for  chemical conversion of atrial fibrillation.   PHYSICAL EXAMINATION ON ADMISSION:  Please see complete H&P and in short  vital signs were stable. Other than irregular heart rhythm he had no  physical abnormality by exam. Admission labs showed therapeutic INR of 2.6.  EKG showed atrial fibrillation with a controlled rate of 78. Corrected QT  interval of 0.481. The rest of his admission labs, which only included a  BMET, were normal.   He had no complaints throughout his entire hospitalization. Vital signs  remained stable with the exception of 2 to 2.8 second pauses on Nov 26, 2004. Otherwise, heart rate remained in the 60s and 70s. His digoxin and  calcium channel blocker were both discontinued as a result of that. He is to  be continued on his sotalol load. She had no problems throughout his  hospitalization. QT interval remained  within normal limits. INR remained  therapeutic and was 2.3 at discharge. He had no further pauses off digoxin  and Cardizem. Blood pressure had some elevation in his diastolic numbers and  his dose of Norvasc increased. This resolved. He was discharged home without  incident on Nov 28, 2004.   DISCHARGE MEDICATIONS:  1.  Norvasc 5 mg daily.  2.  Sotalol 120 mg b.i.d.  3.  Coumadin 5 mg daily, except 2.5 mg on Saturday.  4.  Tricor 145 mg daily.  5.  Zetia 10 mg daily.  6.  Altace 15 mg daily.   He is instructed not to resume his Cartia or his digoxin. He has an  appointment for an EKG and an INR check at Dr. Norris Cross office on Tuesday,  Dec 03, 2004, at 2:45 p.m.      Adrian Saran, N.P.      Armanda Magic, M.D.  Electronically Signed    HB/MEDQ  D:  02/16/2005  T:  02/16/2005  Job:  [id number]

## 2010-11-22 NOTE — Assessment & Plan Note (Signed)
[facility]                              CARDIOLOGY OFFICE NOTE   NAME:[name], [name]                    MRN:          [id number]  DATE:03/04/2006                            DOB:          11/02/1955    [name] returns for followup today.  Please refer to my previous notes  for details.  Since I increased his Cardizem, he feels much better.  He  denies any dyspnea, chest pain, palpitations or syncope.  He is scheduled  for atrial fibrillation ablation at [facility] in early September.   MEDICATIONS AT PRESENT:  1. Coumadin as directed.  2. Cardizem 480 mg p.o. daily.   PHYSICAL EXAMINATION:  VITAL SIGNS:  Blood pressure of 135/86, pulse 92.  He  weighs 262 pounds.  CHEST:  Clear.  CARDIOVASCULAR:  Irregular rhythm.  EXTREMITIES:  No edema.   DIAGNOSES:  1. Atrial fibrillation.  2. Coumadin therapy.  3. Hypertension.  4. History of hyperlipidemia.   PLAN:  [name] is much improved since I saw him last.  We will continue  with his Cardizem for rate control and Coumadin with a goal INR of 2 to 3.  He is scheduled for his atrial fibrillation ablation in approximately 10  days at [facility].  We will see him back in approximately 2 months.  His blood pressure appears to be reasonably well controlled today.                              Madolyn Frieze [name], MD, [facility]    BSC/MedQ  DD:  03/04/2006  DT:  03/05/2006  Job #:  [id number]

## 2010-11-22 NOTE — Cardiovascular Report (Signed)
NAME:  MAYSON, MCNEISH NO.:  [id number]   MEDICAL RECORD NO.:  [id number]          PATIENT TYPE:  OIB   LOCATION:  2852                         FACILITY:  [facility]   PHYSICIAN:  Veverly Fells. Excell Seltzer, MD  DATE OF BIRTH:  10-26-1955   DATE OF PROCEDURE:  03/25/2006  DATE OF DISCHARGE:                              CARDIAC CATHETERIZATION   PROCEDURE:  Thrombin injection into the left common femoral artery  pseudoaneurysm.   INDICATIONS:  Left common femoral artery pseudoaneurysm.   PROCEDURE:  This procedure was performed under Dr. Kayren Eaves  supervision.  Mr. Wray is a very pleasant 55 year old male who has  undergone a previous percutaneous procedure and developed a left common  femoral artery pseudoaneurysm.  The pseudoaneurysm has partially thrombosed,  as seen on vascular ultrasound; however, the patient has had marked pain in  this area, and he was brought to have a thrombin injection performed in  order to thrombose the remainder of the pseudoaneurysm in hopes to improve  his pain.   The left groin was prepped in a normal sterile fashion.  Under ultrasound  guidance, thrombin was injected into the left common femoral pseudoaneurysm.  The needle tip was echolucent and was well visualized and was clear of the  left common femoral artery.  A very small amount of thrombin was injected  with immediate thrombosis of the pseudoaneurysm.  There was normal arterial  flow demonstrated following the procedure via ultrasound.  The patient had  no complications.  Will be discharged home following a short observation  period.      Veverly Fells. Excell Seltzer, MD  Electronically Signed     MDC/MEDQ  D:  03/25/2006  T:  03/26/2006  Job:  [id number]

## 2010-11-22 NOTE — Discharge Summary (Signed)
NAME:  [name], [name] NO.:  [id number]   MEDICAL RECORD NO.:  [id number]                   PATIENT TYPE:  INP   LOCATION:  4737                                 FACILITY:  [facility]   PHYSICIAN:  Katherine Basset, Dr.                    DATE OF BIRTH:  07/20/1955   DATE OF ADMISSION:  04/11/2003  DATE OF DISCHARGE:  04/14/2003                                 DISCHARGE SUMMARY   PROCEDURES:  1. A 2-D echocardiogram revealing ejection fraction of 45-50%.  Negative     valvular abnormalities.  Moderate left atrial enlargement.  2. Chest CT:  Negative for pulmonary emboli.  3. Adenosine Cardiolite revealing ejection fraction of 35%; evidence of     anterior wall ischemia, fixed inferior wall defect.  4. On April 14, 2003, cardiac catheterization by Dr. Katherine Basset revealing     no evidence of coronary artery disease, ejection fraction of 40 to 45%.   DISCHARGE DIAGNOSES:  1. Atrial fibrillation; suspect recent new onset; moderate rate control at     the time of discharge, on digoxin and beta-blocker.  TSH within normal     range.  The patient had been taking large amounts of caffeine, Sudafed     and albuterol metered dose inhaler prior to admission to treat symptoms     of shortness of breath.  2. Systemic anticoagulation secondary to atrial fibrillation; subcutaneously     Lovenox during admission; Coumadin started post cardiac catheterization.     Discharged on subcutaneously Lovenox and Coumadin with followup planned     in our Coumadin clinic.  3. Tachycardia induced cardiomyopathy:  Ejection fraction at the time of     heart catheterization 40 to 45%.  No coronary artery disease by coronary     angiography.  Prior false-positive adenosine Cardiolite.  4. Diarrhea during admission with abdominal cramping, treated with p.r.n.     Imodium and sublingual Levsin.  5. History of asthma, followed by Dr. Casimiro Needle B. Wert as an outpatient.   PLAN:  The patient  is discharged home in stable condition.   DISCHARGE MEDICATIONS:  1. Toprol XL 100 mg p.o.  2. Digoxin 0.25 mg p.o. daily (new).  3. Lovenox 120 mg subcutaneously b.i.d. for three days (new)  4. Singulair 10 mg p.o. daily.  5. Coumadin 5 mg, two tablets Saturday and Sunday, one tablet Monday or as     otherwise directed by physician (new).   DISCHARGE ACTIVITY:  No heavy lifting greater than 5 pounds, bending,  stooping or straining x7 days.  No driving or sexual activity day of  discharge.   DISCHARGE DIET:  Low-salt, low-cholesterol, low-fat diet.   WOUND CARE:  May shower.   DISCHARGE INSTRUCTIONS:  Call Dr. Gareth Morgan nurse [phone]) if bruising,  swelling, pain at catheterization site.   FOLLOWUP:  1. Dr. Mayford Knife, Friday, October 29, at 12:45 p.m.  2. Pro-time/ INR, [facility], third floor, [address]     [address], on Monday at 4:15 p.m.   HISTORY OF PRESENT ILLNESS:  The patient is a 55 year old gentleman with  history of asthma.  He had been experiencing shortness of breath, orthopnea,  with subsequent insomnia, and secondary fatigue.  The patient was taking  large amounts of Sudafed, caffeine and albuterol MDI thinking that his  shortness of breath was related to asthma exacerbation.  He saw Dr. Sandrea Hughs April 10, 2003, the day prior to admission, where EKG revealed new  onset of atrial fibrillation.  He is discharged on Toprol XL 15 mg per day,  aspirin and p.r.n. Xanax by Dr. Sherene Sires.  The patient refused hospital  admission/ evaluation at that time.   The day of admission, the patient saw primary care physician. Dr. Elana Alm.  Reade with complaints of bitemporal headache and nausea.  This worsened,  with associated dizziness and lightheadedness.  He was sent by Dr. Nicholos Johns to  [facility] emergency room for further evaluation.   DIAGNOSES:  Problem 1.  Atrial fibrillation:  Initial rapid ventricular  response.  Needs less than 120's.  He was  treated with beta-blocker  initially 25 b.i.d., increase to 50 b.i.d. during admission.  Initial  Cardizem bolus and drip which were subsequently discontinued and changed to  digoxin bolus and load.  He is continued on digoxin and Toprol XL at the  time of discharge with fairly good rate control.   Problem 2.  Tachycardia-induced cardiomyopathy:  Ejection fraction 45-50% by  2-D echocardiogram.  Subsequent adenosine Cardiolite revealed EF of 35%  sufficient for anterior wall ischemia and old inferior scar.  Subsequent  cardiac catheterization April 14, 2003, revealed no evidence of coronary  artery disease.  The ejection fraction confirmed low at 40 to 45%.  The  patient has ruled out for myocardial infarction by negative serial cardiac  enzymes.   Problem 3.  Systemic anticoagulation secondary to atrial fibrillation.  He  was started on subcutaneously Lovenox to which Coumadin was added post  catheterization.  Discharged on subcutaneously Lovenox, oral dose Coumadin,  with plans for follow up in our Coumadin clinic.   Problem 4.  Diarrhea, abdominal cramping.  Treated with p.r.n. Imodium and  sublingual Levsin.   Problem 5.  Asthma.  This was not a problem during the course of this  admission.   LABORATORY DATA:  Admission WBC 10.7, subsequently 9.5.  Hemoglobin 17,  subsequently 16.8.  Hematocrit 47.9.  Differential within normal range.  Admission pro-time 13, INR 1.0 on April 13, 2003.  Sodium 141, K 3.8,  chloride 105, CO2 26.  Glucose initially 109 and subsequent 128.  BUN 19,  creatinine 1.2, calcium 9.5.  Cholesterol 217, triglycerides  178, HDL 58,  LDL 36, TSH within normal range at 1.16.  First CK 299, MB fraction 5.1, troponin I 0.03.  Second CK 267, MB 4.5,  troponin is 0.01.  The third CK is 213, MB fraction 3.5, troponin I 3.01.   CT was negative for evidence of pulmonary emboli.  Chest x-ray April 13, 2003, was negative for acute cardiac or pulmonary   process.   Admission EKG revealed atrial fibrillation with variable ventricular  response, 110 to 120's.  No acute ST-T abnormalities.      [name], N.P.  Katherine Basset, Dr.    Letitia [name]  D:  04/22/2003  T:  04/22/2003  Job:  [id number]   cc:   Molly Maduro A. Nicholos Johns, M.D.  [address]  [place]  [place] [address]  Fax: [phone]

## 2010-11-22 NOTE — Telephone Encounter (Signed)
Pt. Was called left message to call to rsc appt.

## 2010-11-22 NOTE — Assessment & Plan Note (Signed)
[facility]                            CARDIOLOGY OFFICE NOTE   NAME:[name], [name]                    MRN:          [id number]  DATE:06/17/2006                            DOB:          01/20/1956    [name] returns for followup today.  He is status post atrial  fibrillation ablation at [facility].  He now is wearing a cardiac  event monitor to rule out recurrent atrial fibrillation.  Since his  ablation, he has not had dyspnea, chest pain, palpitations or syncope.   MEDICATIONS INCLUDE:  1. Coumadin as directed.  2. Cardizem 240 mg p.o. daily.  3. Tikosyn 0.5 mg p.o. b.i.d.   PHYSICAL EXAMINATION:  Shows a blood pressure of 136/96 and his pulse is  83.  NECK:  Supple.  CHEST:  Clear.  CARDIOVASCULAR EXAM:  Reveals a regular rate and rhythm.  EXTREMITIES:  Show no edema.   DIAGNOSES:  1. Paroxysmal atrial fibrillation, status post ablation at [facility].  2. Coumadin therapy.  3. Hypertension.  4. Hyperlipidemia.   PLAN:  [name] is doing well from a symptomatic standpoint.  He remains in  sinus rhythm.  If his cardiac event monitor shows no recurrent atrial  fibrillation, then there are plans for his Tikosyn to be discontinued.  He is then to have a repeat monitor in six months and, if there is no  recurrent atrial fibrillation, then Dr. Berneice Gandy plans to discontinue  his Coumadin at that point.  His blood pressure is elevated today and I  have asked him to increase his Cardizem to 360 mg p.o. daily.  When he  returns for his next PT check, we will check a CBC, BMET and magnesium.  He will see me back in six months.     Madolyn Frieze [name], MD, [facility]  Electronically Signed    BSC/MedQ  DD: 06/17/2006  DT: 06/17/2006  Job #: [id number]

## 2010-11-24 ENCOUNTER — Inpatient Hospital Stay (INDEPENDENT_AMBULATORY_CARE_PROVIDER_SITE_OTHER)
Admission: RE | Admit: 2010-11-24 | Discharge: 2010-11-24 | Disposition: A | Discharge status: Home / Self Care | Payer: BC Managed Care – PPO | Source: Ambulatory Visit | Attending: Family Medicine | Admitting: Family Medicine

## 2010-11-24 DIAGNOSIS — S335XXA Sprain of ligaments of lumbar spine, initial encounter: Secondary | ICD-10-CM

## 2010-11-25 ENCOUNTER — Emergency Department (HOSPITAL_COMMUNITY)
Admission: EM | Admit: 2010-11-25 | Discharge: 2010-11-25 | Disposition: A | Discharge status: Home / Self Care | Payer: BC Managed Care – PPO | Attending: Emergency Medicine | Admitting: Emergency Medicine

## 2010-11-25 ENCOUNTER — Inpatient Hospital Stay (INDEPENDENT_AMBULATORY_CARE_PROVIDER_SITE_OTHER)
Admit: 2010-11-25 | Discharge: 2010-11-25 | Disposition: A | Discharge status: Home / Self Care | Payer: BC Managed Care – PPO

## 2010-11-25 DIAGNOSIS — M799 Soft tissue disorder, unspecified: Secondary | ICD-10-CM

## 2010-11-25 DIAGNOSIS — M549 Dorsalgia, unspecified: Secondary | ICD-10-CM

## 2011-02-08 ENCOUNTER — Encounter (HOSPITAL_BASED_OUTPATIENT_CLINIC_OR_DEPARTMENT_OTHER): Payer: Self-pay

## 2011-02-08 ENCOUNTER — Emergency Department (HOSPITAL_BASED_OUTPATIENT_CLINIC_OR_DEPARTMENT_OTHER)
Admission: EM | Admit: 2011-02-08 | Discharge: 2011-02-08 | Disposition: A | Discharge status: Home / Self Care | Payer: BC Managed Care – PPO | Attending: Emergency Medicine | Admitting: Emergency Medicine

## 2011-02-08 DIAGNOSIS — E785 Hyperlipidemia, unspecified: Secondary | ICD-10-CM | POA: Insufficient documentation

## 2011-02-08 DIAGNOSIS — M791 Myalgia, unspecified site: Secondary | ICD-10-CM

## 2011-02-08 DIAGNOSIS — I4891 Unspecified atrial fibrillation: Secondary | ICD-10-CM | POA: Insufficient documentation

## 2011-02-08 DIAGNOSIS — I10 Essential (primary) hypertension: Secondary | ICD-10-CM | POA: Insufficient documentation

## 2011-02-08 DIAGNOSIS — F329 Major depressive disorder, single episode, unspecified: Secondary | ICD-10-CM | POA: Insufficient documentation

## 2011-02-08 DIAGNOSIS — F3289 Other specified depressive episodes: Secondary | ICD-10-CM | POA: Insufficient documentation

## 2011-02-08 DIAGNOSIS — IMO0001 Reserved for inherently not codable concepts without codable children: Secondary | ICD-10-CM | POA: Insufficient documentation

## 2011-02-08 MED ORDER — HYDROCODONE-ACETAMINOPHEN 5-325 MG PO TABS: 1.0000 | ORAL_TABLET | ORAL | Status: AC | PRN

## 2011-02-08 MED ORDER — HYDROMORPHONE HCL 2 MG/ML IJ SOLN
2.0000 mg | Freq: Once | INTRAMUSCULAR | Status: AC
  Administered 2011-02-08: 2 mg via INTRAMUSCULAR
  Filled 2011-02-08: qty 1

## 2011-02-08 NOTE — ED Provider Notes (Addendum)
History     CSN: [id number] Arrival date & time: 02/08/2011  5:16 AM  Chief Complaint  Patient presents with  . Muscle Pain   Patient is a 55 y.o. male presenting with musculoskeletal pain. The history is provided by the patient and the spouse.  Muscle Pain This is a new problem. The current episode started yesterday. The problem occurs constantly. The problem has not changed since onset.Pertinent negatives include no chest pain, no abdominal pain, no headaches and no shortness of breath. The symptoms are aggravated by nothing. The symptoms are relieved by nothing.  SEEN IN MAY HERE FOR SAME AND FOLLOWED BY DR REED FOR THIS. IMPROVED ONLY BY NARCOTIC PAIN MEDS. TODAY IT IS IN BOTH UE AND BUT CAN OCCUR LE. PAIN IS 10/10 AND TRIED MOTRIN AT HOME BUT DID NOT HELP.    Past Medical History  Diagnosis Date  . PAF (paroxysmal atrial fibrillation)   . HLD (hyperlipidemia)   . HTN (hypertension)   . Depression   . Allergic rhinitis   . NICM (nonischemic cardiomyopathy)     Past Surgical History  Procedure Date  . Atrial ablation surgery 2007    [facility]    Family History  Problem Relation Age of Onset  . Asthma    . Breast cancer    . Lymphoma    . COPD      History  Substance Use Topics  . Smoking status: Never Smoker   . Smokeless tobacco: Not on file  . Alcohol Use: No      Review of Systems  Constitutional: Negative for fever and chills.  HENT: Positive for neck stiffness. Negative for neck pain.   Eyes: Negative for photophobia and pain.  Respiratory: Negative for shortness of breath.   Cardiovascular: Negative for chest pain and leg swelling.  Gastrointestinal: Negative for nausea, vomiting and abdominal pain.  Genitourinary: Negative for dysuria and hematuria.  Musculoskeletal: Positive for myalgias. Negative for arthralgias.  Skin: Negative for rash.  Neurological: Negative for headaches.  Hematological: Negative for adenopathy.    Physical Exam  BP 146/96   Pulse 65  Resp 16  Ht 6\' 4"  (1.93 m)  Wt 250 lb (113.399 kg)  BMI 30.43 kg/m2  SpO2 97%  Physical Exam  Nursing note and vitals reviewed. Constitutional: He is oriented to person, place, and time. He appears well-developed and well-nourished.  HENT:  Head: Normocephalic and atraumatic.  Mouth/Throat: Oropharynx is clear and moist.  Eyes: Conjunctivae and EOM are normal. Pupils are equal, round, and reactive to light. No scleral icterus.  Neck: Normal range of motion. Neck supple.  Cardiovascular: Normal rate and regular rhythm.   Pulmonary/Chest: Effort normal and breath sounds normal.  Abdominal: Soft. Bowel sounds are normal. There is no tenderness.  Musculoskeletal: Normal range of motion. He exhibits no edema and no tenderness.  Lymphadenopathy:    He has no cervical adenopathy.  Neurological: He is alert and oriented to person, place, and time.  Skin: Skin is warm and dry. No rash noted.    ED Course  Procedures  MDM IMPROVED IN ED WITH DILAUDID. HAS HAD SIMILAR SYMPTOMS IN PAST AND SEEN HERE IN MAY FOR SAME. ETIOLOGY NOT CLEAR BUT NO DARK URINE. ONLY OCCURS ABOUT 3 TIMES AS YEAR AND NOT RELATED TO DEGREE OF EXERTION. AND CAN HAPPEN IN LEGS.       Shelda Jakes, MD 02/08/11 [id number]  Shelda Jakes, MD 02/08/11 [phone]

## 2011-02-08 NOTE — ED Notes (Signed)
Pt was falling asleep and actively snoring during the triage process.  Pt stated several times that he was unable to sleep at home.

## 2011-02-08 NOTE — ED Notes (Signed)
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  Pt states that he is in severe pain and unable to sleep.  Pt presents Tramadol bottle and states that his PCP usually prescribes it for his pain but he is currently out of this medication.

## 2011-03-19 ENCOUNTER — Telehealth: Payer: Self-pay | Admitting: Cardiology

## 2011-03-19 DIAGNOSIS — I4891 Unspecified atrial fibrillation: Secondary | ICD-10-CM

## 2011-03-19 MED ORDER — LISINOPRIL 10 MG PO TABS: 10.0000 mg | ORAL_TABLET | Freq: Every day | ORAL | Status: DC

## 2011-03-19 NOTE — Telephone Encounter (Signed)
Pt called and stated he needed a new pres called into [facility] on [address] with new dosage indicated.  Pt has been without this for 3 days.  lisinipril

## 2011-03-19 NOTE — Telephone Encounter (Signed)
Refills sent to the pharm. Pt made aware Deliah Goody

## 2011-04-19 ENCOUNTER — Encounter (HOSPITAL_BASED_OUTPATIENT_CLINIC_OR_DEPARTMENT_OTHER): Payer: Self-pay

## 2011-04-19 ENCOUNTER — Emergency Department (HOSPITAL_BASED_OUTPATIENT_CLINIC_OR_DEPARTMENT_OTHER)
Admission: EM | Admit: 2011-04-19 | Discharge: 2011-04-19 | Disposition: A | Discharge status: Home / Self Care | Payer: BC Managed Care – PPO | Attending: Emergency Medicine | Admitting: Emergency Medicine

## 2011-04-19 DIAGNOSIS — Y9239 Other specified sports and athletic area as the place of occurrence of the external cause: Secondary | ICD-10-CM | POA: Insufficient documentation

## 2011-04-19 DIAGNOSIS — I10 Essential (primary) hypertension: Secondary | ICD-10-CM | POA: Insufficient documentation

## 2011-04-19 DIAGNOSIS — M549 Dorsalgia, unspecified: Secondary | ICD-10-CM

## 2011-04-19 DIAGNOSIS — F3289 Other specified depressive episodes: Secondary | ICD-10-CM | POA: Insufficient documentation

## 2011-04-19 DIAGNOSIS — W1789XA Other fall from one level to another, initial encounter: Secondary | ICD-10-CM | POA: Insufficient documentation

## 2011-04-19 DIAGNOSIS — F329 Major depressive disorder, single episode, unspecified: Secondary | ICD-10-CM | POA: Insufficient documentation

## 2011-04-19 DIAGNOSIS — Z79899 Other long term (current) drug therapy: Secondary | ICD-10-CM | POA: Insufficient documentation

## 2011-04-19 DIAGNOSIS — M79603 Pain in arm, unspecified: Secondary | ICD-10-CM

## 2011-04-19 DIAGNOSIS — Y92838 Other recreation area as the place of occurrence of the external cause: Secondary | ICD-10-CM | POA: Insufficient documentation

## 2011-04-19 DIAGNOSIS — E785 Hyperlipidemia, unspecified: Secondary | ICD-10-CM | POA: Insufficient documentation

## 2011-04-19 MED ORDER — OXYCODONE-ACETAMINOPHEN 10-325 MG PO TABS: 1.0000 | ORAL_TABLET | Freq: Four times a day (QID) | ORAL | Status: AC | PRN

## 2011-04-19 MED ORDER — HYDROCODONE-ACETAMINOPHEN 5-325 MG PO TABS
ORAL_TABLET | ORAL | Status: AC
  Administered 2011-04-19: 2 via ORAL
  Filled 2011-04-19: qty 2

## 2011-04-19 MED ORDER — HYDROCODONE-ACETAMINOPHEN 5-325 MG PO TABS
2.0000 | ORAL_TABLET | Freq: Once | ORAL | Status: AC
  Administered 2011-04-19: 2 via ORAL

## 2011-04-19 NOTE — ED Notes (Signed)
Pt states that he was at a football game tonight and fell off of the bleachers.  Pt states that he lost his balance, misstepped and fell, no LOC, no n/v, severe pain rated at 10/10.  Pt estimates fall at about 1ft

## 2011-04-19 NOTE — ED Provider Notes (Signed)
History     CSN: [id number] Arrival date & time: 04/19/2011  2:56 AM  Chief Complaint  Patient presents with  . Fall    (Consider location/radiation/quality/duration/timing/severity/associated sxs/prior treatment) HPI This is a 55 year old white male who fell off the bleachers fall game at about 10 PM yesterday. He fell landing on his palms and he is arty been having left lower back pain for which he was treated with tramadol by his PCP. He did not initially have pain after the fall but gradually developed severe pain in the soft tissue of his upper arms and worsening of his left lower back pain he describes the pain is severe. The pain in his upper arms is not exacerbated by palpation or movement but the pain in his left lower back is exacerbated by movement. There is no neurologic symptoms associated with back pain. It was a loss of consciousness. He denies spine pain.  Past Medical History  Diagnosis Date  . PAF (paroxysmal atrial fibrillation)   . HLD (hyperlipidemia)   . HTN (hypertension)   . Depression   . Allergic rhinitis   . NICM (nonischemic cardiomyopathy)     Past Surgical History  Procedure Date  . Atrial ablation surgery 2007    [facility]    Family History  Problem Relation Age of Onset  . Asthma    . Breast cancer    . Lymphoma    . COPD      History  Substance Use Topics  . Smoking status: Never Smoker   . Smokeless tobacco: Not on file  . Alcohol Use: No      Review of Systems  All other systems reviewed and are negative.    Allergies  Review of patient's allergies indicates no known allergies.  Home Medications   Current Outpatient Rx  Name Route Sig Dispense Refill  . HYDROCODONE-ACETAMINOPHEN 5-325 MG PO TABS Oral Take 2 tablets by mouth every 6 (six) hours as needed.      . ASPIRIN 81 MG PO TABS Oral Take 81 mg by mouth daily.      Marland Kitchen DILTIAZEM HCL ER BEADS 360 MG PO CP24 Oral Take 1 capsule (360 mg total) by mouth daily. 30 capsule 11  .  DULOXETINE HCL 30 MG PO CPEP Oral Take 30 mg by mouth daily.      Marland Kitchen LISINOPRIL 10 MG PO TABS Oral Take 1 tablet (10 mg total) by mouth daily. 30 tablet 12  . MONTELUKAST SODIUM 10 MG PO TABS Oral Take 10 mg by mouth at bedtime.      . TRAMADOL HCL 50 MG PO TABS Oral Take 50-100 mg by mouth. Take 1 to 2 tablets every 6-8 hours as needed for pain.       BP 138/91  Pulse 93  Temp(Src) 97.5 F (36.4 C) (Oral)  Resp 23  Ht 6\' 4"  (1.93 m)  Wt 250 lb (113.399 kg)  BMI 30.43 kg/m2  SpO2 99%  Physical Exam General: Well-developed, well-nourished male in no acute distress; appearance consistent with age of record; appears uncomfortable HENT: normocephalic, atraumatic Eyes: Normal appearance Neck: supple Heart: regular rate and rhythm Lungs: clear to auscultation bilaterally Abdomen: soft; nontender; nondistended Back: Left SI joint tenderness Extremities: No deformity; full range of motion; no upper arm tenderness Neurologic: Awake, alert and oriented;motor function intact in all extremities and symmetric; no facial droop Skin: Warm and dry    ED Course  Procedures (including critical care time)   MDM  Hanley Seamen, MD 04/19/11 [phone]

## 2011-05-21 DIAGNOSIS — E785 Hyperlipidemia, unspecified: Secondary | ICD-10-CM | POA: Insufficient documentation

## 2011-05-21 DIAGNOSIS — M549 Dorsalgia, unspecified: Secondary | ICD-10-CM | POA: Insufficient documentation

## 2011-05-21 DIAGNOSIS — M79609 Pain in unspecified limb: Secondary | ICD-10-CM | POA: Insufficient documentation

## 2011-05-21 DIAGNOSIS — K4020 Bilateral inguinal hernia, without obstruction or gangrene, not specified as recurrent: Secondary | ICD-10-CM | POA: Insufficient documentation

## 2011-05-21 DIAGNOSIS — I10 Essential (primary) hypertension: Secondary | ICD-10-CM | POA: Insufficient documentation

## 2011-05-21 DIAGNOSIS — Z79899 Other long term (current) drug therapy: Secondary | ICD-10-CM | POA: Insufficient documentation

## 2011-05-22 ENCOUNTER — Emergency Department (INDEPENDENT_AMBULATORY_CARE_PROVIDER_SITE_OTHER): Payer: BC Managed Care – PPO

## 2011-05-22 ENCOUNTER — Encounter (HOSPITAL_BASED_OUTPATIENT_CLINIC_OR_DEPARTMENT_OTHER): Payer: Self-pay | Admitting: *Deleted

## 2011-05-22 ENCOUNTER — Emergency Department (HOSPITAL_BASED_OUTPATIENT_CLINIC_OR_DEPARTMENT_OTHER)
Admission: EM | Admit: 2011-05-22 | Discharge: 2011-05-22 | Disposition: A | Discharge status: Home / Self Care | Payer: BC Managed Care – PPO | Attending: Emergency Medicine | Admitting: Emergency Medicine

## 2011-05-22 DIAGNOSIS — K4020 Bilateral inguinal hernia, without obstruction or gangrene, not specified as recurrent: Secondary | ICD-10-CM

## 2011-05-22 DIAGNOSIS — M549 Dorsalgia, unspecified: Secondary | ICD-10-CM

## 2011-05-22 DIAGNOSIS — R109 Unspecified abdominal pain: Secondary | ICD-10-CM

## 2011-05-22 DIAGNOSIS — N281 Cyst of kidney, acquired: Secondary | ICD-10-CM

## 2011-05-22 MED ORDER — TRAMADOL HCL 50 MG PO TABS: 50.0000 mg | ORAL_TABLET | Freq: Four times a day (QID) | ORAL | Status: AC | PRN

## 2011-05-22 MED ORDER — IBUPROFEN 800 MG PO TABS: 800.0000 mg | ORAL_TABLET | Freq: Three times a day (TID) | ORAL | Status: AC

## 2011-05-22 MED ORDER — CYCLOBENZAPRINE HCL 10 MG PO TABS: 10.0000 mg | ORAL_TABLET | Freq: Two times a day (BID) | ORAL | Status: AC | PRN

## 2011-05-22 MED ORDER — HYDROMORPHONE HCL PF 1 MG/ML IJ SOLN
1.0000 mg | Freq: Once | INTRAMUSCULAR | Status: AC
  Administered 2011-05-22: 1 mg via INTRAMUSCULAR
  Filled 2011-05-22: qty 1

## 2011-05-22 MED ORDER — TRAMADOL HCL 50 MG PO TABS
50.0000 mg | ORAL_TABLET | Freq: Once | ORAL | Status: AC
  Administered 2011-05-22: 50 mg via ORAL
  Filled 2011-05-22: qty 1

## 2011-05-22 NOTE — ED Notes (Signed)
C/o bilateral arm pain and back pain. Pt has been treating pain with tramadol which he ran out of Monday.

## 2011-05-22 NOTE — ED Notes (Signed)
Dr. Opitz at bedside. 

## 2011-05-22 NOTE — ED Provider Notes (Signed)
History     CSN: [id number] Arrival date & time: 05/22/2011 12:41 AM   First MD Initiated Contact with Patient 05/22/11 0135      Chief Complaint  Patient presents with  . Back Pain  . Arm Pain    (Consider location/radiation/quality/duration/timing/severity/associated sxs/prior treatment) Patient is a 55 y.o. male presenting with back pain. The history is provided by the patient.  Back Pain  This is a new problem. Episode onset: Worse over the last few days. The problem occurs constantly. The problem has not changed since onset.The pain is associated with twisting. The pain is present in the lumbar spine. The quality of the pain is described as shooting. The pain radiates to the left thigh. The pain is severe. The symptoms are aggravated by bending, twisting and certain positions. The pain is worse during the night. Stiffness is present in the morning. Pertinent negatives include no chest pain, no fever, no numbness, no headaches, no abdominal pain, no abdominal swelling, no bowel incontinence, no bladder incontinence, no dysuria, no pelvic pain, no paresthesias, no paresis and no tingling. Treatments tried: Was prescribed tramadol is helping but he has ran out.   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 He is scheduled followup with his doctor next week and is requesting prescription for tramadol until he can see his physician. No alleviating factors otherwise.  Past Medical History  Diagnosis Date  . PAF (paroxysmal atrial fibrillation)   . HLD (hyperlipidemia)   . HTN (hypertension)   . Depression   . Allergic rhinitis   . NICM (nonischemic cardiomyopathy)     Past Surgical History  Procedure Date  . Atrial ablation surgery 2007    [facility]    Family History  Problem Relation Age of Onset  . Asthma    . Breast cancer    . Lymphoma    . COPD      History  Substance Use Topics  . Smoking status: Never Smoker   . Smokeless tobacco: Not on  file  . Alcohol Use: No      Review of Systems  Constitutional: Negative for fever and chills.  HENT: Negative for neck pain and neck stiffness.   Eyes: Negative for pain.  Respiratory: Negative for shortness of breath.   Cardiovascular: Negative for chest pain.  Gastrointestinal: Negative for abdominal pain and bowel incontinence.  Genitourinary: Negative for bladder incontinence, dysuria and pelvic pain.  Musculoskeletal: Positive for back pain.  Skin: Negative for rash.  Neurological: Negative for tingling, numbness, headaches and paresthesias.  All other systems reviewed and are negative.    Allergies  Review of patient's allergies indicates no known allergies.  Home Medications   Current Outpatient Rx  Name Route Sig Dispense Refill  . TRAMADOL HCL 50 MG PO TABS Oral Take 50-100 mg by mouth. Take 1 to 2 tablets every 6-8 hours as needed for pain.     . ASPIRIN 81 MG PO TABS Oral Take 81 mg by mouth daily.      Marland Kitchen DILTIAZEM HCL ER BEADS 360 MG PO CP24 Oral Take 1 capsule (360 mg total) by mouth daily. 30 capsule 11  . DULOXETINE HCL 30 MG PO CPEP Oral Take 30 mg by mouth daily.      Marland Kitchen HYDROCODONE-ACETAMINOPHEN 5-325 MG PO TABS Oral Take 2 tablets by mouth every 6 (six) hours as needed.      Marland Kitchen LISINOPRIL 10 MG PO TABS Oral Take 1 tablet (10  mg total) by mouth daily. 30 tablet 12  . MONTELUKAST SODIUM 10 MG PO TABS Oral Take 10 mg by mouth at bedtime.        BP 143/93  Pulse 81  Temp(Src) 97.8 F (36.6 C) (Oral)  Resp 15  Ht 6\' 2"  (1.88 m)  Wt 255 lb (115.667 kg)  BMI 32.74 kg/m2  SpO2 97%  Physical Exam  Constitutional: He is oriented to person, place, and time. He appears well-developed and well-nourished.  HENT:  Head: Normocephalic and atraumatic.  Eyes: Conjunctivae and EOM are normal. Pupils are equal, round, and reactive to light.  Neck: Full passive range of motion without pain. Neck supple. No thyromegaly present.       No cervical, thoracic or lumbar  midline tenderness. Has left paralumbar tenderness no deformity or lesion. Distal neurovascular intact lower extremities with equal strengths, sensorium to light touch and DTRs  Cardiovascular: Normal rate, regular rhythm, S1 normal, S2 normal and intact distal pulses.   Pulmonary/Chest: Effort normal and breath sounds normal.  Abdominal: Soft. Bowel sounds are normal. There is no tenderness. There is no CVA tenderness.  Musculoskeletal: Normal range of motion.  Neurological: He is alert and oriented to person, place, and time. He has normal strength and normal reflexes. No cranial nerve deficit or sensory deficit. He displays a negative Romberg sign. GCS eye subscore is 4. GCS verbal subscore is 5. GCS motor subscore is 6.       Normal Gait  Skin: Skin is warm and dry. No rash noted. No cyanosis. Nails show no clubbing.  Psychiatric: He has a normal mood and affect. His speech is normal and behavior is normal.    ED Course  Procedures (including critical care time)  Labs Reviewed - No data to display Ct Abdomen Pelvis Wo Contrast  05/22/2011  *RADIOLOGY REPORT*  Clinical Data: Left flank pain and back pain.  CT ABDOMEN AND PELVIS WITHOUT CONTRAST  Technique:  Multidetector CT imaging of the abdomen and pelvis was performed following the standard protocol without intravenous contrast.  Comparison: CT of the abdomen and pelvis performed 02/13/2009  Findings: Minimal bibasilar atelectasis is noted.  The liver and spleen are unremarkable in appearance.  The gallbladder is within normal limits.  The pancreas and adrenal glands are unremarkable.  There is a 5.7 cm cyst arising from the upper pole of the right kidney.  Nonspecific perinephric stranding is noted bilaterally. The kidneys are otherwise unremarkable in appearance.  No free fluid is identified.  The small bowel is unremarkable in appearance.  The stomach is within normal limits.  No acute vascular abnormalities are seen.  The appendix is  decompressed and unremarkable in appearance; it is incidentally seen extending into the patient's small right inguinal hernia.  There is no evidence for appendicitis.  The colon is partially filled with stool and is unremarkable in appearance.  The bladder is mildly distended; impression of the prostate on the base of the bladder is stable in appearance from 2010 and likely benign.  Scattered calcification is noted within the prostate, which remains normal in size in the transverse dimension.  Small bilateral inguinal hernias are noted, left greater than right; as described above, the appendix is noted extending into the right inguinal hernia.  No inguinal lymphadenopathy is seen.  No acute osseous abnormalities are identified.  IMPRESSION:  1.  No acute abnormalities identified within the abdomen or pelvis. 2.  Small bilateral inguinal hernias, left greater than right; the appendix is  noted extending into the right inguinal hernia.  The appendix is unremarkable in appearance, without evidence for appendicitis. 3.  Right renal cyst noted.  Original Report Authenticated By: Tonia Ghent, M.D.      MDM   Imaging of lumbar spine given the age as well as evaluation of aorta. Pain control emergency department, improved with Dilaudid. Patient has close followup with his physician. Prescription provided. Patient states understanding back pain precautions. The red flags or indication for emergent MRI tonight       Sunnie Nielsen, MD 05/22/11 1610

## 2011-05-29 ENCOUNTER — Emergency Department (HOSPITAL_BASED_OUTPATIENT_CLINIC_OR_DEPARTMENT_OTHER)
Admission: EM | Admit: 2011-05-29 | Discharge: 2011-05-29 | Disposition: A | Discharge status: Home / Self Care | Payer: BC Managed Care – PPO | Attending: Emergency Medicine | Admitting: Emergency Medicine

## 2011-05-29 ENCOUNTER — Encounter (HOSPITAL_BASED_OUTPATIENT_CLINIC_OR_DEPARTMENT_OTHER): Payer: Self-pay | Admitting: *Deleted

## 2011-05-29 DIAGNOSIS — R110 Nausea: Secondary | ICD-10-CM | POA: Insufficient documentation

## 2011-05-29 DIAGNOSIS — E785 Hyperlipidemia, unspecified: Secondary | ICD-10-CM | POA: Insufficient documentation

## 2011-05-29 DIAGNOSIS — I10 Essential (primary) hypertension: Secondary | ICD-10-CM | POA: Insufficient documentation

## 2011-05-29 DIAGNOSIS — F3289 Other specified depressive episodes: Secondary | ICD-10-CM | POA: Insufficient documentation

## 2011-05-29 DIAGNOSIS — F329 Major depressive disorder, single episode, unspecified: Secondary | ICD-10-CM | POA: Insufficient documentation

## 2011-05-29 DIAGNOSIS — F1120 Opioid dependence, uncomplicated: Secondary | ICD-10-CM | POA: Insufficient documentation

## 2011-05-29 DIAGNOSIS — F19939 Other psychoactive substance use, unspecified with withdrawal, unspecified: Secondary | ICD-10-CM | POA: Insufficient documentation

## 2011-05-29 DIAGNOSIS — M549 Dorsalgia, unspecified: Secondary | ICD-10-CM | POA: Insufficient documentation

## 2011-05-29 DIAGNOSIS — F1123 Opioid dependence with withdrawal: Secondary | ICD-10-CM

## 2011-05-29 MED ORDER — TRAMADOL HCL 50 MG PO TABS: 50.0000 mg | ORAL_TABLET | Freq: Four times a day (QID) | ORAL | Status: AC | PRN

## 2011-05-29 MED ORDER — CLONIDINE HCL 0.1 MG PO TABS
0.1000 mg | ORAL_TABLET | Freq: Once | ORAL | Status: AC
  Administered 2011-05-29: 0.1 mg via ORAL
  Filled 2011-05-29: qty 1

## 2011-05-29 MED ORDER — CLONIDINE HCL 0.2 MG PO TABS: 0.1000 mg | ORAL_TABLET | Freq: Two times a day (BID) | ORAL | Status: DC

## 2011-05-29 NOTE — ED Notes (Signed)
Pt felt like he was becoming addicted to his tramadol that he takes for chronic back pains, so "i threw them all away". States two nights the pain became severe, and "i've been going through horrible withdrawals".

## 2011-05-29 NOTE — ED Provider Notes (Signed)
History     CSN: [id number] Arrival date & time: 05/29/2011  3:47 AM   First MD Initiated Contact with Patient 05/29/11 0402      Chief Complaint  Patient presents with  . Back Pain    (Consider location/radiation/quality/duration/timing/severity/associated sxs/prior treatment) HPI Comments: Pt states he was taking tramadol and 4 days stopped cold [place] and for the last 2 days has been withdrawling and states he needs help with the withdrawls.  Patient is a 55 y.o. male presenting with back pain. The history is provided by the patient.  Back Pain  This is a chronic problem. The current episode started more than 2 days ago. The problem occurs constantly. The problem has been gradually worsening. The pain is associated with no known injury. The quality of the pain is described as aching and cramping. The pain does not radiate. The pain is at a severity of 6/10. The pain is moderate. The symptoms are aggravated by bending, twisting and certain positions. The pain is the same all the time. Stiffness is present all day. Pertinent negatives include no chest pain, no fever, no bowel incontinence, no bladder incontinence, no dysuria, no paresthesias, no tingling and no weakness. He has tried nothing for the symptoms. The treatment provided no relief.    Past Medical History  Diagnosis Date  . PAF (paroxysmal atrial fibrillation)   . HLD (hyperlipidemia)   . HTN (hypertension)   . Depression   . Allergic rhinitis   . NICM (nonischemic cardiomyopathy)     Past Surgical History  Procedure Date  . [facility]rial ablation surgery 2007    [facility]    Family History  Problem Relation Age of Onset  . Asthma    . Breast cancer    . Lymphoma    . COPD      History  Substance Use Topics  . Smoking status: Never Smoker   . Smokeless tobacco: Not on file  . Alcohol Use: No      Review of Systems  Constitutional: Positive for chills and diaphoresis. Negative for fever and fatigue.  HENT:  Negative for neck pain.   Cardiovascular: Positive for palpitations. Negative for chest pain.  Gastrointestinal: Positive for nausea and diarrhea. Negative for vomiting and bowel incontinence.  Genitourinary: Negative for bladder incontinence and dysuria.  Musculoskeletal: Positive for back pain.  Neurological: Negative for tingling, weakness and paresthesias.  Psychiatric/Behavioral: The patient is nervous/anxious.   All other systems reviewed and are negative.    Allergies  Review of patient's allergies indicates no known allergies.  Home Medications   Current Outpatient Rx  Name Route Sig Dispense Refill  . ASPIRIN 81 MG PO TABS Oral Take 81 mg by mouth daily.      Marland Kitchen CLONIDINE HCL 0.2 MG PO TABS Oral Take 0.5 tablets (0.1 mg total) by mouth 2 (two) times daily. 10 tablet 0  . CYCLOBENZAPRINE HCL 10 MG PO TABS Oral Take 1 tablet (10 mg total) by mouth 2 (two) times daily as needed for muscle spasms. 20 tablet 0  . DILTIAZEM HCL ER BEADS 360 MG PO CP24 Oral Take 1 capsule (360 mg total) by mouth daily. 30 capsule 11  . DULOXETINE HCL 30 MG PO CPEP Oral Take 30 mg by mouth daily.      Marland Kitchen HYDROCODONE-ACETAMINOPHEN 5-325 MG PO TABS Oral Take 2 tablets by mouth every 6 (six) hours as needed.      . IBUPROFEN 800 MG PO TABS Oral Take 1 tablet (800 mg  total) by mouth 3 (three) times daily. 21 tablet 0  . LISINOPRIL 10 MG PO TABS Oral Take 1 tablet (10 mg total) by mouth daily. 30 tablet 12  . MONTELUKAST SODIUM 10 MG PO TABS Oral Take 10 mg by mouth at bedtime.      . TRAMADOL HCL 50 MG PO TABS Oral Take 50-100 mg by mouth. Take 1 to 2 tablets every 6-8 hours as needed for pain.     Marland Kitchen TRAMADOL HCL 50 MG PO TABS Oral Take 1 tablet (50 mg total) by mouth every 6 (six) hours as needed for pain. Maximum dose= 8 tablets per day 20 tablet 0  . TRAMADOL HCL 50 MG PO TABS Oral Take 1 tablet (50 mg total) by mouth every 6 (six) hours as needed for pain. Maximum dose= 8 tablets per day 25 tablet 0     BP 160/96  Pulse 81  Temp(Src) 98.4 F (36.9 C) (Oral)  Resp 16  SpO2 100%  Physical Exam  Nursing note and vitals reviewed. Constitutional: He is oriented to person, place, and time. He appears well-developed and well-nourished. He appears distressed.  HENT:  Head: Normocephalic and atraumatic.  Mouth/Throat: Oropharynx is clear and moist.  Eyes: EOM are normal. Pupils are equal, round, and reactive to light.  Neck: Normal range of motion. Neck supple. No spinous process tenderness and no muscular tenderness present. No rigidity. Normal range of motion present.  Cardiovascular: Normal rate, regular rhythm, normal heart sounds and intact distal pulses.   No murmur heard. Pulmonary/Chest: Effort normal and breath sounds normal. He has no wheezes. He has no rales.  Abdominal: Soft. He exhibits no distension. There is no tenderness. There is no CVA tenderness.  Musculoskeletal: He exhibits no tenderness.       Lumbar back: He exhibits decreased range of motion, tenderness and pain. He exhibits no swelling, no deformity and normal pulse.       Diffuse muscle tenderness  Neurological: He is alert and oriented to person, place, and time. Coordination normal.  Reflex Scores:      Patellar reflexes are 1+ on the right side and 1+ on the left side. Skin: Skin is warm and dry. No rash noted.  Psychiatric: His mood appears anxious.    ED Course  Procedures (including critical care time)  Labs Reviewed - No data to display No results found.   1. Withdrawal from opioids       MDM   Pt with chronic back pain and on tramadol and decided that he didn't want to take meds anymore and stopped.  Now withdrawaling and states that he can't take it anymore.  Wants to be off meds and asking to try clonidine first.  Pt o/w stable.  No sx concerning for infection and normal neuro exam.  Pt is jittery on exam and anxious.  He will try the clonidine first and if does not help will start back on  the tramadol and wean down.        Gwyneth Sprout, MD 05/29/11 0730

## 2011-06-08 ENCOUNTER — Encounter (HOSPITAL_BASED_OUTPATIENT_CLINIC_OR_DEPARTMENT_OTHER): Payer: Self-pay

## 2011-06-08 ENCOUNTER — Emergency Department (HOSPITAL_BASED_OUTPATIENT_CLINIC_OR_DEPARTMENT_OTHER)
Admission: EM | Admit: 2011-06-08 | Discharge: 2011-06-08 | Disposition: A | Discharge status: Home / Self Care | Payer: BC Managed Care – PPO | Attending: Emergency Medicine | Admitting: Emergency Medicine

## 2011-06-08 DIAGNOSIS — I10 Essential (primary) hypertension: Secondary | ICD-10-CM | POA: Insufficient documentation

## 2011-06-08 DIAGNOSIS — Z79899 Other long term (current) drug therapy: Secondary | ICD-10-CM | POA: Insufficient documentation

## 2011-06-08 DIAGNOSIS — F1120 Opioid dependence, uncomplicated: Secondary | ICD-10-CM | POA: Insufficient documentation

## 2011-06-08 DIAGNOSIS — F1123 Opioid dependence with withdrawal: Secondary | ICD-10-CM

## 2011-06-08 DIAGNOSIS — E785 Hyperlipidemia, unspecified: Secondary | ICD-10-CM | POA: Insufficient documentation

## 2011-06-08 DIAGNOSIS — F19939 Other psychoactive substance use, unspecified with withdrawal, unspecified: Secondary | ICD-10-CM | POA: Insufficient documentation

## 2011-06-08 HISTORY — DX: Opioid dependence, uncomplicated: F11.20

## 2011-06-08 MED ORDER — TRAMADOL HCL 50 MG PO TABS: 50.0000 mg | ORAL_TABLET | Freq: Four times a day (QID) | ORAL | Status: AC | PRN

## 2011-06-08 NOTE — ED Notes (Addendum)
Pt states that his PCP gave him a tramadol taper to help him wean himself from the medication, he made it to two pills, but he has run out of medication at this time.  Pt is here for assistance.  Pt is requesting Dr Dierdre Highman or Dr Melven Sartorius.  Pt states that he is not having any of the other symptoms such as anxiety, tremors, and diaphoresis that he has read about "just my arms and legs are killing me"

## 2011-06-08 NOTE — ED Provider Notes (Signed)
History     CSN: [id number] Arrival date & time: 06/08/2011  3:53 AM   First MD Initiated Contact with Patient 06/08/11 [phone]      Chief Complaint  Patient presents with  . 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 He states that over the last 12 months he has had intermittent back pain requiring tramadol therapy for pain control. He states that he ran out of his medication recently and started to have aches and pains in his arms and legs. This was constant, worse with palpation, not associated with anxiety, agitation, abdominal pain, nausea or vomiting, diaphoresis. He states that when he has stopped his medication in the past he has the same symptoms. He was seen in the emergency department recently and given a tapering dose of tramadol which he tapered from 6 tablets a day down to 2 tablets a day.  However 48 hours ago he ran out of his medication and started to develop the arm and leg pain again. Symptoms are constant, moderate, not associated with swelling or rashes. He is not on anticholesterol medication and has not had a change in the color of his urine. He requests a refill on the tramadol to help continue his taper.  The history is provided by the patient, a friend and medical records.    Past Medical History  Diagnosis Date  . PAF (paroxysmal atrial fibrillation)   . HLD (hyperlipidemia)   . HTN (hypertension)   . Depression   . Allergic rhinitis   . NICM (nonischemic cardiomyopathy)   . Narcotic addiction     Past Surgical History  Procedure Date  . [facility]rial ablation surgery 2007    [facility]    Family History  Problem Relation Age of Onset  . Asthma    . Breast cancer    . Lymphoma    . COPD      History  Substance Use Topics  . Smoking status: Never Smoker   . Smokeless tobacco: Not on  file  . Alcohol Use: No      Review of Systems  Constitutional: Negative for fever and chills.  HENT: Negative for neck pain.   Eyes: Negative for visual disturbance.  Respiratory: Negative for cough and shortness of breath.   Cardiovascular: Negative for chest pain.  Gastrointestinal: Negative for nausea, vomiting, abdominal pain and diarrhea.  Genitourinary: Negative for hematuria.  Musculoskeletal: Positive for myalgias and back pain.  Skin: Negative for rash.  Neurological: Negative for dizziness, weakness and numbness.  Hematological: Does not bruise/bleed easily.  Psychiatric/Behavioral: Negative for agitation.    Allergies  Review of patient's allergies indicates no known allergies.  Home Medications   Current Outpatient Rx  Name Route Sig Dispense Refill  . ASPIRIN 81 MG PO TABS Oral Take 81 mg by mouth daily.      Marland Kitchen CLONIDINE HCL 0.2 MG PO TABS Oral Take 0.5 tablets (0.1 mg total) by mouth 2 (two) times daily. 10 tablet 0  . DILTIAZEM HCL ER BEADS 360 MG PO CP24 Oral Take 1 capsule (360 mg total) by mouth daily. 30 capsule 11  . DULOXETINE HCL 30 MG PO CPEP Oral Take 30 mg by mouth daily.      Marland Kitchen HYDROCODONE-ACETAMINOPHEN 5-325 MG PO TABS Oral Take 2 tablets by mouth every 6 (six) hours as needed.      Marland Kitchen LISINOPRIL 10  MG PO TABS Oral Take 1 tablet (10 mg total) by mouth daily. 30 tablet 12  . MONTELUKAST SODIUM 10 MG PO TABS Oral Take 10 mg by mouth at bedtime.      . TRAMADOL HCL 50 MG PO TABS Oral Take 50-100 mg by mouth. Take 1 to 2 tablets every 6-8 hours as needed for pain.     Marland Kitchen TRAMADOL HCL 50 MG PO TABS Oral Take 1 tablet (50 mg total) by mouth every 6 (six) hours as needed for pain. Maximum dose= 8 tablets per day 25 tablet 0  . TRAMADOL HCL 50 MG PO TABS Oral Take 1 tablet (50 mg total) by mouth every 6 (six) hours as needed for pain. Maximum dose= 8 tablets per day 6 tablet 0    1 tablet every 12 hours for 24 hours, then 1 table ...    BP 179/97  Pulse 87   Temp(Src) 97.7 F (36.5 C) (Oral)  Resp 18  Ht 6\' 4"  (1.93 m)  Wt 250 lb (113.399 kg)  BMI 30.43 kg/m2  SpO2 98%  Physical Exam  Nursing note and vitals reviewed. Constitutional: He appears well-developed and well-nourished. No distress.  HENT:  Head: Normocephalic and atraumatic.  Mouth/Throat: Oropharynx is clear and moist. No oropharyngeal exudate.  Eyes: Conjunctivae and EOM are normal. Pupils are equal, round, and reactive to light. Right eye exhibits no discharge. Left eye exhibits no discharge. No scleral icterus.  Neck: Normal range of motion. Neck supple. No JVD present. No thyromegaly present.  Cardiovascular: Normal rate, regular rhythm, normal heart sounds and intact distal pulses.  Exam reveals no gallop and no friction rub.   No murmur heard. Pulmonary/Chest: Effort normal and breath sounds normal. No respiratory distress. He has no wheezes. He has no rales.  Abdominal: Soft. Bowel sounds are normal. He exhibits no distension and no mass. There is no tenderness.  Musculoskeletal: Normal range of motion. He exhibits tenderness ( Tenderness to palpation in the triceps and quadriceps bilaterally). He exhibits no edema.  Lymphadenopathy:    He has no cervical adenopathy.  Neurological: He is alert. Coordination normal.  Skin: Skin is warm and dry. No rash noted. No erythema.  Psychiatric: He has a normal mood and affect. His behavior is normal.    ED Course  Procedures (including critical care time)  Labs Reviewed - No data to display No results found.   1. Opiate withdrawal       MDM  The patient gives a history consistent with opiate withdrawal however his symptoms are not typical but they are reproducible over the last year when he goes off the medication. I have recommended on further testing including checking potassium and CK levels however he has refused stating that he adamantly disagrees with other sources of his pain. Patient was given 6 tablets of  tramadol by prescription and told to followup with his family doctor for any further use. Please reference discharge instructions for completeness of patient's discharge information. Patient is ambulatory without focal neurologic complaint, tachycardia or fever at discharge        Vida Roller, MD 06/08/11 0425

## 2011-06-13 ENCOUNTER — Telehealth: Payer: Self-pay | Admitting: Cardiology

## 2011-06-13 NOTE — Telephone Encounter (Signed)
LMTC

## 2011-06-13 NOTE — Telephone Encounter (Signed)
New problem Pt has been taking lisinopril and he has been having trouble urinating. He would like to switch back to clonidine if possible Please call

## 2011-06-13 NOTE — Telephone Encounter (Signed)
Reviewed with Dr. [name]. Per Dr. [name]- decreased urination is not a typical side effect of lisinopril. Orders received to have the patient come for an echo and bmp. Dr. [name] does not want to d/c the lisinopril due to the patient's low EF. I have explained this to the patient. He will come on Monday 06/16/11 for an echo and labwork.

## 2011-06-13 NOTE — Telephone Encounter (Signed)
I spoke with the patient. He states that he was on hydrocodone for back pain and became a little dependent on it. His PCP was weaning him off of this and put him on clonidine in the interim to help with the withdrawal. He held his lisinopril while on the clonidine for a week. Since restarting the lisinopril, he has noticed that he has been having a lot of trouble urinating. He went off the lisinopril over the weekend and could urinate fine, he went back on it this week and cannot empty his bladder. He is up 4-6 times a night trying to urinate. He would like to go back on clonidine. He does not know what dose he was taking. I advised I will review with Dr. [name] and call him back.

## 2011-06-16 ENCOUNTER — Other Ambulatory Visit (INDEPENDENT_AMBULATORY_CARE_PROVIDER_SITE_OTHER): Payer: BC Managed Care – PPO | Admitting: *Deleted

## 2011-06-16 ENCOUNTER — Other Ambulatory Visit: Payer: BC Managed Care – PPO | Admitting: *Deleted

## 2011-06-16 ENCOUNTER — Ambulatory Visit (HOSPITAL_COMMUNITY): Payer: BC Managed Care – PPO | Attending: Internal Medicine | Admitting: Radiology

## 2011-06-16 DIAGNOSIS — I428 Other cardiomyopathies: Secondary | ICD-10-CM

## 2011-06-16 DIAGNOSIS — E785 Hyperlipidemia, unspecified: Secondary | ICD-10-CM | POA: Insufficient documentation

## 2011-06-16 DIAGNOSIS — I10 Essential (primary) hypertension: Secondary | ICD-10-CM | POA: Insufficient documentation

## 2011-06-16 DIAGNOSIS — I4891 Unspecified atrial fibrillation: Secondary | ICD-10-CM | POA: Insufficient documentation

## 2011-06-16 LAB — BASIC METABOLIC PANEL
CO2: 25 mEq/L (ref 19–32)
Calcium: 8.9 mg/dL (ref 8.4–10.5)
GFR: 74.93 mL/min (ref >60.00)
Sodium: 140 mEq/L (ref 135–145)

## 2011-06-25 ENCOUNTER — Encounter: Payer: Self-pay | Admitting: Cardiology

## 2011-06-25 ENCOUNTER — Ambulatory Visit (INDEPENDENT_AMBULATORY_CARE_PROVIDER_SITE_OTHER): Payer: BC Managed Care – PPO | Admitting: Cardiology

## 2011-06-25 ENCOUNTER — Encounter: Payer: BC Managed Care – PPO | Admitting: Cardiology

## 2011-06-25 VITALS — BP 134/82 | HR 102 | Ht 76.0 in | Wt 255.0 lb

## 2011-06-25 DIAGNOSIS — I428 Other cardiomyopathies: Secondary | ICD-10-CM

## 2011-06-25 DIAGNOSIS — I10 Essential (primary) hypertension: Secondary | ICD-10-CM

## 2011-06-25 DIAGNOSIS — I4891 Unspecified atrial fibrillation: Secondary | ICD-10-CM

## 2011-06-25 LAB — TSH: TSH: 1.708 u[IU]/mL (ref 0.350–4.500)

## 2011-06-25 LAB — CBC WITH DIFFERENTIAL/PLATELET
Hemoglobin: 15.9 g/dL (ref 13.0–17.0)
Lymphocytes Relative: 41 % (ref 12–46)
Lymphs Abs: 3.1 10*3/uL (ref 0.7–4.0)
MCH: 29.9 pg (ref 26.0–34.0)
Monocytes Relative: 9 % (ref 3–12)
Neutro Abs: 3.6 10*3/uL (ref 1.7–7.7)
Neutrophils Relative %: 47 % (ref 43–77)
RBC: 5.32 MIL/uL (ref 4.22–5.81)
WBC: 7.6 10*3/uL (ref 4.0–10.5)

## 2011-06-25 MED ORDER — DILTIAZEM HCL ER COATED BEADS 120 MG PO CP24: 120.0000 mg | ORAL_CAPSULE | Freq: Every evening | ORAL | Status: DC

## 2011-06-25 MED ORDER — DABIGATRAN ETEXILATE MESYLATE 150 MG PO CAPS: 150.0000 mg | ORAL_CAPSULE | Freq: Two times a day (BID) | ORAL | Status: DC

## 2011-06-25 MED ORDER — DILTIAZEM HCL ER BEADS 360 MG PO CP24: 360.0000 mg | ORAL_CAPSULE | ORAL | Status: DC

## 2011-06-25 NOTE — Patient Instructions (Signed)
Your physician recommends that you schedule a follow-up appointment in: JAN  TAKE DILTIAZEM 360 MG IN THE AM AND 120 MG IN THE PM  STOP ASPIRIN  START PRADAXA 150 MG ONE TABLET TWICE DAILY  Your physician recommends that you return for lab work in: TODAY

## 2011-06-25 NOTE — Progress Notes (Signed)
[id number] male with history of paroxysmal atrial fibrillation returns for followup. The patient had a cardiac catheterization in 2004 that showed an ejection fraction of 45% and normal coronary arteries. He did have atrial fibrillation at that time and his LV function improved after sinus rhythm was restored. He ultimately had atrial fibrillation ablation. He did well for several years. However in August of 2010 he was admitted to [facility] with recurrent atrial fibrillation. A TSH was normal. An echocardiogram performed on August 11 of 2010 showed an ejection fraction of 35-40%. There was biatrial enlargement. There was at least moderate mitral regurgitation. The patient subsequently had a transesophageal echocardiogram that showed an ejection fraction of 35% and moderate mitral regurgitation. He underwent cardioversion at that time as there was no left atrial appendage thrombus. He was seen in followup by Dr. Johney Frame and close followup was felt to be indicated. Possible attempt at another ablation could be considered in the future if his atrial fibrillation recurred. Atrial fibrillation recurred in Nov 2010. We therefore proceeded with a TEE guided cardioversion on June 08, 2009. His ejection fraction was 20-25%, mild mitral regurgitation, moderate to severe left atrial enlargement and mild to moderate right atrial enlargement. I last saw him in April of 2012. Repeat echo in Dec 2012 showed an EF of 60-65 and grade 2 diastolic dysfunction. The patient denies any dyspnea on exertion, orthopnea, PND, pedal edema, palpitations, syncope or chest pain.    Current Outpatient Prescriptions  Medication Sig Dispense Refill  . aspirin 81 MG tablet Take 81 mg by mouth daily.        Marland Kitchen diltiazem (TIAZAC) 360 MG 24 hr capsule Take 1 capsule (360 mg total) by mouth daily.  30 capsule  11  . DULoxetine (CYMBALTA) 30 MG capsule Take 30 mg by mouth daily.        Marland Kitchen lisinopril (PRINIVIL,ZESTRIL) 10 MG  tablet Take 1 tablet (10 mg total) by mouth daily.  30 tablet  12  . montelukast (SINGULAIR) 10 MG tablet Take 10 mg by mouth at bedtime.           Past Medical History  Diagnosis Date  . PAF (paroxysmal atrial fibrillation)   . HLD (hyperlipidemia)   . HTN (hypertension)   . Depression   . Allergic rhinitis   . NICM (nonischemic cardiomyopathy)   . Narcotic addiction     Past Surgical History  Procedure Date  . Atrial ablation surgery 2007    [facility]    History   Social History  . Marital Status: Married    Spouse Name: N/A    Number of Children: N/A  . Years of Education: N/A   Occupational History  . owns landscaping business    Social History Main Topics  . Smoking status: Never Smoker   . Smokeless tobacco: Not on file  . Alcohol Use: No  . Drug Use: No  . Sexually Active: Not on file   Other Topics Concern  . Not on file   Social History Narrative  . No narrative on file    ROS: no fevers or chills, productive cough, hemoptysis, dysphasia, odynophagia, melena, hematochezia, dysuria, hematuria, rash, seizure activity, orthopnea, PND, pedal edema, claudication. Remaining systems are negative.  Physical Exam: Well-developed well-nourished in no acute distress.  Skin is warm and dry.  HEENT is normal.  Neck is supple. No thyromegaly.  Chest is clear to auscultation with normal expansion.  Cardiovascular exam is irregular Abdominal exam nontender or distended.  No masses palpated. Extremities show no edema. neuro grossly intact  ECG Atrial fibrillation, NSST

## 2011-06-25 NOTE — Assessment & Plan Note (Signed)
Improved on most recent echocardiogram. Previous reduction in LV function most likely related to atrial fibrillation with rapid ventricular response.

## 2011-06-25 NOTE — Assessment & Plan Note (Signed)
Blood pressure controlled. Continue present medications. 

## 2011-06-25 NOTE — Progress Notes (Signed)
Addended by: Freddi Starr on: 06/25/2011 10:57 AM   Modules accepted: Orders

## 2011-06-25 NOTE — Progress Notes (Signed)
Addended by: Freddi Starr on: 06/25/2011 12:07 PM   Modules accepted: Orders

## 2011-06-25 NOTE — Assessment & Plan Note (Signed)
Patient has developed recurrent atrial fibrillation. However he is not symptomatic as he has been in the past. This may be because of recent onset or that his rate is better controlled. His rate is minimally elevated. Increase Cardizem to 360 mg in the morning and 120 in the evening. He is not taking coumadin as we discussed previously. He has embolic risk factor of hypertension. He is willing to take pradaxa. Discontinue aspirin and began 150 mg twice a day. Note renal function normal on December 10. Check CBC and TSH. Recent echo shows normalization of LV function. Patient is planning a trip to [place]. When he returns we will discuss options of cardioversion if atrial fibrillation persists. One option would be to proceed with cardioversion 3 weeks after he has been on pradaxa. A second option would be to consider repeat ablation. A third option would be beginning an antiarrhythmic and then cardioverting. We will make that decision at next office visit.

## 2011-06-26 NOTE — Progress Notes (Signed)
This encounter was created in error - please disregard.

## 2011-07-16 ENCOUNTER — Encounter: Payer: Self-pay | Admitting: *Deleted

## 2011-07-16 ENCOUNTER — Ambulatory Visit (INDEPENDENT_AMBULATORY_CARE_PROVIDER_SITE_OTHER): Payer: BC Managed Care – PPO | Admitting: Cardiology

## 2011-07-16 ENCOUNTER — Encounter: Payer: Self-pay | Admitting: Cardiology

## 2011-07-16 VITALS — BP 130/80 | HR 100 | Ht 76.0 in | Wt 269.0 lb

## 2011-07-16 DIAGNOSIS — I4891 Unspecified atrial fibrillation: Secondary | ICD-10-CM

## 2011-07-16 DIAGNOSIS — I428 Other cardiomyopathies: Secondary | ICD-10-CM

## 2011-07-16 DIAGNOSIS — I10 Essential (primary) hypertension: Secondary | ICD-10-CM

## 2011-07-16 DIAGNOSIS — E785 Hyperlipidemia, unspecified: Secondary | ICD-10-CM

## 2011-07-16 NOTE — Assessment & Plan Note (Signed)
Management per primary care. 

## 2011-07-16 NOTE — Progress Notes (Signed)
HPI: Pleasant male with history of paroxysmal atrial fibrillation returns for followup. The patient had a cardiac catheterization in 2004 that showed an ejection fraction of 45% and normal coronary arteries. He did have atrial fibrillation at that time and his LV function improved after sinus rhythm was restored. He ultimately had atrial fibrillation ablation. He did well for several years. However in August of 2010 he was admitted to [facility] with recurrent atrial fibrillation. A TSH was normal. An echocardiogram performed on August 11 of 2010 showed an ejection fraction of 35-40%. There was biatrial enlargement. There was at least moderate mitral regurgitation. The patient subsequently had a transesophageal echocardiogram that showed an ejection fraction of 35% and moderate mitral regurgitation. He underwent cardioversion at that time as there was no left atrial appendage thrombus. He was seen in followup by Dr. Johney Frame and close followup was felt to be indicated. Possible attempt at another ablation could be considered in the future if his atrial fibrillation recurred. Atrial fibrillation recurred in Nov 2010. We therefore proceeded with a TEE guided cardioversion on June 08, 2009. His ejection fraction was 20-25%, mild mitral regurgitation, moderate to severe left atrial enlargement and mild to moderate right atrial enlargement. Repeat echo in Dec 2012 showed an EF of 60-65 and grade 2 diastolic dysfunction. I last saw him in Dec 2012 and he had devoloped recurrent atrial fibrillation; cardizem increased and pradaxa initiated. Since then, the patient denies any dyspnea on exertion, orthopnea, PND, pedal edema, palpitations, syncope or chest pain.   Current Outpatient Prescriptions  Medication Sig Dispense Refill  . dabigatran (PRADAXA) 150 MG CAPS Take 1 capsule (150 mg total) by mouth every 12 (twelve) hours.  60 capsule  12  . diltiazem (CARDIZEM CD) 120 MG 24 hr capsule Take 1 capsule  (120 mg total) by mouth every evening.  30 capsule  11  . diltiazem (TIAZAC) 360 MG 24 hr capsule Take 1 capsule (360 mg total) by mouth every morning.  30 capsule  11  . DULoxetine (CYMBALTA) 30 MG capsule Take 30 mg by mouth daily.        Marland Kitchen lisinopril (PRINIVIL,ZESTRIL) 10 MG tablet Take 20 mg by mouth daily.      . montelukast (SINGULAIR) 10 MG tablet Take 10 mg by mouth at bedtime.           Past Medical History  Diagnosis Date  . PAF (paroxysmal atrial fibrillation)   . HLD (hyperlipidemia)   . HTN (hypertension)   . Depression   . Allergic rhinitis   . NICM (nonischemic cardiomyopathy)   . Narcotic addiction     Past Surgical History  Procedure Date  . Atrial ablation surgery 2007    [facility]    History   Social History  . Marital Status: Married    Spouse Name: N/A    Number of Children: N/A  . Years of Education: N/A   Occupational History  . owns landscaping business    Social History Main Topics  . Smoking status: Never Smoker   . Smokeless tobacco: Not on file  . Alcohol Use: No  . Drug Use: No  . Sexually Active: Not on file   Other Topics Concern  . Not on file   Social History Narrative  . No narrative on file    ROS: no fevers or chills, productive cough, hemoptysis, dysphasia, odynophagia, melena, hematochezia, dysuria, hematuria, rash, seizure activity, orthopnea, PND, pedal edema, claudication. Remaining systems are negative.  Physical Exam:  Well-developed well-nourished in no acute distress.  Skin is warm and dry.  HEENT is normal.  Neck is supple. No thyromegaly.  Chest is clear to auscultation with normal expansion.  Cardiovascular exam is irregular Abdominal exam nontender or distended. No masses palpated. Extremities show no edema. neuro grossly intact  ECG atrial fibrillation at a rate of 100. Axis normal. RV conduction delay. Nonspecific ST changes.

## 2011-07-16 NOTE — Assessment & Plan Note (Signed)
Improved on most recent echo. 

## 2011-07-16 NOTE — Assessment & Plan Note (Signed)
Patient remains in atrial fibrillation. However he is asymptomatic. Continue Cardizem. Continue pradaxa. We have discussed the options today. It has been several years since his last cardioversion. We therefore will proceed with no antiarrhythmic. Hopefully he will maintain sinus rhythm. We will make sure that he has been on Pradaxa for 3 weeks prior to the procedure. If he has recurrent episodes in the future we may need to consider repeat ablation or adding an antiarrhythmic.

## 2011-07-16 NOTE — Patient Instructions (Signed)
Your physician wants you to follow-up in: 3 MONTHS You will receive a reminder letter in the mail two months in advance. If you don't receive a letter, please call our office to schedule the follow-up appointment.  Your physician has recommended that you have a Cardioversion (DCCV). Electrical Cardioversion uses a jolt of electricity to your heart either through paddles or wired patches attached to your chest. This is a controlled, usually prescheduled, procedure. Defibrillation is done under light anesthesia in the hospital, and you usually go home the day of the procedure. This is done to get your heart back into a normal rhythm. You are not awake for the procedure. Please see the instruction sheet given to you today.

## 2011-07-16 NOTE — Assessment & Plan Note (Signed)
Blood pressure controlled. Continue present medications. 

## 2011-07-17 ENCOUNTER — Other Ambulatory Visit: Payer: Self-pay | Admitting: Cardiology

## 2011-07-17 DIAGNOSIS — I4891 Unspecified atrial fibrillation: Secondary | ICD-10-CM

## 2011-07-17 MED ORDER — SODIUM CHLORIDE 0.9 % IV SOLN: 250.0000 mL | INTRAVENOUS | Status: DC

## 2011-07-17 MED ORDER — SODIUM CHLORIDE 0.9 % IV SOLN: INTRAVENOUS | Status: DC

## 2011-07-17 MED ORDER — SODIUM CHLORIDE 0.9 % IJ SOLN: 3.0000 mL | Freq: Two times a day (BID) | INTRAMUSCULAR | Status: DC

## 2011-07-17 MED ORDER — HYDROCORTISONE 1 % EX CREA: 1.0000 "application " | TOPICAL_CREAM | Freq: Three times a day (TID) | CUTANEOUS | Status: DC | PRN

## 2011-07-17 MED ORDER — SODIUM CHLORIDE 0.9 % IJ SOLN: 3.0000 mL | INTRAMUSCULAR | Status: DC | PRN

## 2011-07-24 ENCOUNTER — Encounter (HOSPITAL_COMMUNITY): Payer: Self-pay | Admitting: Pharmacy Technician

## 2011-07-25 ENCOUNTER — Ambulatory Visit: Payer: Self-pay | Admitting: Cardiology

## 2011-07-25 DIAGNOSIS — I4891 Unspecified atrial fibrillation: Secondary | ICD-10-CM

## 2011-07-25 DIAGNOSIS — Z7901 Long term (current) use of anticoagulants: Secondary | ICD-10-CM

## 2011-08-07 ENCOUNTER — Telehealth: Payer: Self-pay | Admitting: Cardiology

## 2011-08-07 NOTE — Telephone Encounter (Signed)
Pt calling re upping lisinopril 10mg  to 20 mg 90 day supple, [facility]

## 2011-08-10 ENCOUNTER — Other Ambulatory Visit: Payer: Self-pay | Admitting: Cardiology

## 2011-08-10 DIAGNOSIS — I4891 Unspecified atrial fibrillation: Secondary | ICD-10-CM

## 2011-08-10 MED ORDER — HYDROCORTISONE 1 % EX CREA: 1.0000 "application " | TOPICAL_CREAM | Freq: Three times a day (TID) | CUTANEOUS | Status: DC | PRN

## 2011-08-10 MED ORDER — SODIUM CHLORIDE 0.9 % IV SOLN: INTRAVENOUS | Status: DC

## 2011-08-10 MED ORDER — SODIUM CHLORIDE 0.9 % IJ SOLN: 3.0000 mL | Freq: Two times a day (BID) | INTRAMUSCULAR | Status: DC

## 2011-08-10 MED ORDER — SODIUM CHLORIDE 0.9 % IV SOLN: 250.0000 mL | INTRAVENOUS | Status: DC

## 2011-08-10 MED ORDER — SODIUM CHLORIDE 0.9 % IJ SOLN: 3.0000 mL | INTRAMUSCULAR | Status: DC | PRN

## 2011-08-11 ENCOUNTER — Other Ambulatory Visit: Payer: Self-pay

## 2011-08-11 ENCOUNTER — Ambulatory Visit (HOSPITAL_COMMUNITY)
Admission: RE | Admit: 2011-08-11 | Discharge: 2011-08-11 | Disposition: A | Discharge status: Home / Self Care | Payer: BC Managed Care – PPO | Source: Ambulatory Visit | Attending: Cardiology | Admitting: Cardiology

## 2011-08-11 ENCOUNTER — Encounter (HOSPITAL_COMMUNITY): Payer: Self-pay

## 2011-08-11 ENCOUNTER — Encounter (HOSPITAL_COMMUNITY)
Admission: RE | Disposition: A | Discharge status: Home / Self Care | Payer: Self-pay | Source: Ambulatory Visit | Attending: Cardiology

## 2011-08-11 ENCOUNTER — Ambulatory Visit (HOSPITAL_COMMUNITY): Payer: BC Managed Care – PPO

## 2011-08-11 DIAGNOSIS — I4891 Unspecified atrial fibrillation: Secondary | ICD-10-CM | POA: Insufficient documentation

## 2011-08-11 HISTORY — PX: CARDIOVERSION: SHX1299

## 2011-08-11 LAB — BASIC METABOLIC PANEL
Calcium: 9.4 mg/dL (ref 8.4–10.5)
GFR calc non Af Amer: >90 mL/min (ref >90)
Glucose, Bld: 113 mg/dL — ABNORMAL HIGH (ref 70–99)
Sodium: 140 mEq/L (ref 135–145)

## 2011-08-11 LAB — CBC
MCH: 29.8 pg (ref 26.0–34.0)
MCHC: 34.2 g/dL (ref 30.0–36.0)
Platelets: 263 10*3/uL (ref 150–400)

## 2011-08-11 SURGERY — CARDIOVERSION
Anesthesia: Monitor Anesthesia Care | Wound class: Clean

## 2011-08-11 MED ORDER — SODIUM CHLORIDE 0.9 % IV SOLN
INTRAVENOUS | Status: DC
  Administered 2011-08-11: 09:00:00 via INTRAVENOUS

## 2011-08-11 MED ORDER — PROPOFOL 10 MG/ML IV BOLUS
INTRAVENOUS | Status: DC | PRN
  Administered 2011-08-11: 120 mg via INTRAVENOUS

## 2011-08-11 MED ORDER — HYDROCORTISONE 1 % EX CREA
1.0000 "application " | TOPICAL_CREAM | Freq: Three times a day (TID) | CUTANEOUS | Status: DC | PRN
  Administered 2011-08-11: 1 via TOPICAL
  Filled 2011-08-11: qty 28

## 2011-08-11 NOTE — Transfer of Care (Signed)
Immediate Anesthesia Transfer of Care Note  Patient: [name]  Procedure(s) Performed:  CARDIOVERSION  Patient Location: Short Stay  Anesthesia Type: General  Level of Consciousness: awake, alert , oriented and patient cooperative  Airway & Oxygen Therapy: Patient Spontanous Breathing and Patient connected to nasal cannula oxygen  Post-op Assessment: Report given to PACU RN, Post -op Vital signs reviewed and stable and Patient moving all extremities  Post vital signs: Reviewed and stable  Complications: No apparent anesthesia complications

## 2011-08-11 NOTE — Anesthesia Postprocedure Evaluation (Signed)
  Anesthesia Post-op Note  Patient: [name]  Procedure(s) Performed:  CARDIOVERSION  Patient Location: Short Stay  Anesthesia Type: General  Level of Consciousness: awake, alert , oriented and patient cooperative  Airway and Oxygen Therapy: Patient Spontanous Breathing and Patient connected to nasal cannula oxygen  Post-op Pain: none  Post-op Assessment: Post-op Vital signs reviewed, Patient's Cardiovascular Status Stable, Respiratory Function Stable, Patent Airway and No signs of Nausea or vomiting  Post-op Vital Signs: Reviewed and stable  Complications: No apparent anesthesia complications

## 2011-08-11 NOTE — Procedures (Signed)
Electrical Cardioversion Procedure Note [name] [id number] 31-May-1956  Procedure: Electrical Cardioversion Indications:  Atrial Fibrillation  Procedure Details Consent: Risks of procedure as well as the alternatives and risks of each were explained to the (patient/caregiver).  Consent for procedure obtained. Time Out: Verified patient identification, verified procedure, site/side was marked, verified correct patient position, special equipment/implants available, medications/allergies/relevent history reviewed, required imaging and test results available.  Performed  Patient placed on cardiac monitor, pulse oximetry, supplemental oxygen as necessary.  Sedation given: Diprovan 120 mg IV administered by anesthesia. Pacer pads placed anterior and posterior chest.  Cardioverted 1 time(s).  Cardioverted at 120J.  Evaluation Findings: Post procedure EKG shows: NSR Complications: None Patient did tolerate procedure well.   [name] 08/11/2011, 9:40 AM

## 2011-08-11 NOTE — Anesthesia Preprocedure Evaluation (Addendum)
Anesthesia Evaluation  Patient identified by MRN, date of birth, ID band Patient awake    Reviewed: Allergy & Precautions, H&P , Patient's Chart, lab work & pertinent test results  Airway Mallampati: I      Dental  (+) Teeth Intact   Pulmonary  clear to auscultation        Cardiovascular hypertension, + dysrhythmias Atrial Fibrillation Irregular Tachycardia    Neuro/Psych PSYCHIATRIC DISORDERS Depression    GI/Hepatic   Endo/Other    Renal/GU      Musculoskeletal   Abdominal   Peds  Hematology   Anesthesia Other Findings   Reproductive/Obstetrics                           Anesthesia Physical Anesthesia Plan  ASA: III  Anesthesia Plan: General   Post-op Pain Management:    Induction: Intravenous  Airway Management Planned: Mask  Additional Equipment:   Intra-op Plan:   Post-operative Plan:   Informed Consent: I have reviewed the patients History and Physical, chart, labs and discussed the procedure including the risks, benefits and alternatives for the proposed anesthesia with the patient or authorized representative who has indicated his/her understanding and acceptance.   Dental advisory given  Plan Discussed with: CRNA and Surgeon  Anesthesia Plan Comments:        Anesthesia Quick Evaluation

## 2011-08-12 ENCOUNTER — Encounter (HOSPITAL_COMMUNITY): Payer: Self-pay | Admitting: Cardiology

## 2011-12-08 ENCOUNTER — Telehealth: Payer: Self-pay | Admitting: Cardiology

## 2011-12-08 NOTE — Telephone Encounter (Signed)
New Problem:     I called the patient and was unable to reach them. I left a message on their voicemail with my name, the reason I called, the name of his physician, and a number to call back to schedule their appointment. 

## 2012-03-31 ENCOUNTER — Encounter: Payer: Self-pay | Admitting: Cardiology

## 2012-03-31 ENCOUNTER — Ambulatory Visit (INDEPENDENT_AMBULATORY_CARE_PROVIDER_SITE_OTHER): Payer: BC Managed Care – PPO | Admitting: Cardiology

## 2012-03-31 VITALS — BP 140/84 | HR 72 | Ht 77.0 in | Wt 275.0 lb

## 2012-03-31 DIAGNOSIS — I4891 Unspecified atrial fibrillation: Secondary | ICD-10-CM

## 2012-03-31 DIAGNOSIS — I428 Other cardiomyopathies: Secondary | ICD-10-CM

## 2012-03-31 MED ORDER — DABIGATRAN ETEXILATE MESYLATE 150 MG PO CAPS: 150.0000 mg | ORAL_CAPSULE | Freq: Two times a day (BID) | ORAL | Status: DC

## 2012-03-31 MED ORDER — LISINOPRIL 40 MG PO TABS: 40.0000 mg | ORAL_TABLET | Freq: Every day | ORAL | Status: DC

## 2012-03-31 NOTE — Assessment & Plan Note (Addendum)
Plan continue cardizem; continue pradaxa; check CBC and renal function. May need to change to coumadin in future for financial reasons.

## 2012-03-31 NOTE — Patient Instructions (Addendum)
Your physician wants you to follow-up in: 6 MONTHS WITH DR [name] IN [place] You will receive a reminder letter in the mail two months in advance. If you don't receive a letter, please call our office to schedule the follow-up appointment.   INCREASE LISINOPRIL TO 40 MG ONCE DAILY  RESTART PRADAXA 150 MG ONE TABLET TWICE DAILY  Your physician recommends that you return for lab WORK IN ONE WEEK

## 2012-03-31 NOTE — Assessment & Plan Note (Signed)
Blood pressure is elevated at home. Increase lisinopril to 40 mg daily. Check potassium and renal function in one week.

## 2012-03-31 NOTE — Assessment & Plan Note (Signed)
LV function improved on most recent echo. 

## 2012-03-31 NOTE — Progress Notes (Signed)
HPI: Pleasant male with history of paroxysmal atrial fibrillation returns for followup. The patient had a cardiac catheterization in 2004 that showed an ejection fraction of 45% and normal coronary arteries. He did have atrial fibrillation at that time and his LV function improved after sinus rhythm was restored. He ultimately had atrial fibrillation ablation. He did well for several years. However in August of 2010 he was admitted to [facility] with recurrent atrial fibrillation. TSH was normal. An echocardiogram performed on August 11 of 2010 showed an ejection fraction of 35-40%. There was biatrial enlargement. There was at least moderate mitral regurgitation. The patient subsequently had a transesophageal echocardiogram that showed an ejection fraction of 35% and moderate mitral regurgitation. He underwent cardioversion at that time as there was no left atrial appendage thrombus. He was seen in followup by Dr. Johney Frame and close followup was felt to be indicated. Possible attempt at another ablation could be considered in the future if his atrial fibrillation recurred. Repeat echo in Dec 2012 showed an EF of 60-65 and grade 2 diastolic dysfunction. Patient had last episode of atrial fibrillation in Jan 2013 and required DCCV 2/13. Since then, the patient denies any dyspnea on exertion, orthopnea, PND, pedal edema, palpitations, syncope or chest pain.    Current Outpatient Prescriptions  Medication Sig Dispense Refill  . acetaminophen (TYLENOL) 325 MG tablet Take 650 mg by mouth every 6 (six) hours as needed. For pain      . aspirin 81 MG tablet Take 81 mg by mouth daily.      Marland Kitchen diltiazem (TIAZAC) 360 MG 24 hr capsule Take 1 capsule (360 mg total) by mouth every morning.  30 capsule  11  . DULoxetine (CYMBALTA) 30 MG capsule Take 30 mg by mouth daily.        Marland Kitchen lisinopril (PRINIVIL,ZESTRIL) 20 MG tablet Take 20 mg by mouth daily.      . montelukast (SINGULAIR) 10 MG tablet Take 10 mg by mouth  at bedtime.           Past Medical History  Diagnosis Date  . PAF (paroxysmal atrial fibrillation)   . HLD (hyperlipidemia)   . HTN (hypertension)   . Depression   . Allergic rhinitis   . NICM (nonischemic cardiomyopathy)   . Narcotic addiction     Past Surgical History  Procedure Date  . Atrial ablation surgery 2007    [facility]  . Cardioversion 08/11/2011    Procedure: CARDIOVERSION;  Surgeon: Lewayne Bunting, MD;  Location: [facility] OR;  Service: Cardiovascular;  Laterality: N/A;    History   Social History  . Marital Status: Married    Spouse Name: N/A    Number of Children: N/A  . Years of Education: N/A   Occupational History  . owns landscaping business    Social History Main Topics  . Smoking status: Never Smoker   . Smokeless tobacco: Not on file  . Alcohol Use: No  . Drug Use: No  . Sexually Active: Not on file   Other Topics Concern  . Not on file   Social History Narrative  . No narrative on file    ROS: no fevers or chills, productive cough, hemoptysis, dysphasia, odynophagia, melena, hematochezia, dysuria, hematuria, rash, seizure activity, orthopnea, PND, pedal edema, claudication. Remaining systems are negative.  Physical Exam: Well-developed well-nourished in no acute distress.  Skin is warm and dry.  HEENT is normal.  Neck is supple.  Chest is clear to auscultation with normal  expansion.  Cardiovascular exam is regular rate and rhythm.  Abdominal exam nontender or distended. No masses palpated. Extremities show no edema. neuro grossly intact  ECG sinus rhythm at a rate of 72. RV conduction delay. Nonspecific ST changes.

## 2012-04-28 NOTE — Addendum Note (Signed)
Addended by: Marrion Coy L on: 04/28/2012 03:18 PM   Modules accepted: Orders

## 2012-05-12 ENCOUNTER — Telehealth: Payer: Self-pay | Admitting: Cardiology

## 2012-05-12 NOTE — Telephone Encounter (Signed)
**Note De-Identified  Obfuscation** Pt. Is advised that Dr. [name] will be back in office tomorrow and will address at that time. Samples of Pradaxa 150 mg left at desk in front office for pt. to pick up, pt. verbalized understanding.

## 2012-05-12 NOTE — Telephone Encounter (Signed)
Pt needs Rx for coumadin to replace the pradaxa because he is beck in town and he uses Therapist, occupational on [address]

## 2012-05-13 ENCOUNTER — Telehealth: Payer: Self-pay | Admitting: Cardiology

## 2012-05-13 NOTE — Telephone Encounter (Signed)
New Problem:    Patient called in wanting to schedule to see Dr. [name] asap about switching his medication.  Please call back.

## 2012-05-13 NOTE — Telephone Encounter (Signed)
Spoke with pt, he wants to switch back to warfarin and needs instructions. Will forward for dr [name] review

## 2012-05-13 NOTE — Telephone Encounter (Signed)
DC pradaxa; coumadin 5 mg po daily; check INR in coumadin clinic in 5 days [name]

## 2012-05-13 NOTE — Telephone Encounter (Signed)
Left message for pt to call.

## 2012-05-17 MED ORDER — WARFARIN SODIUM 5 MG PO TABS: 5.0000 mg | ORAL_TABLET | Freq: Every day | ORAL | Status: DC

## 2012-05-17 NOTE — Telephone Encounter (Signed)
Spoke with pt, Aware of dr Ludwig Clarks recommendations. appt made for CVRR on Friday. Script to the [facility]

## 2012-07-09 ENCOUNTER — Other Ambulatory Visit: Payer: Self-pay | Admitting: Cardiology

## 2012-07-15 ENCOUNTER — Other Ambulatory Visit: Payer: Self-pay | Admitting: Gastroenterology

## 2012-07-15 DIAGNOSIS — R109 Unspecified abdominal pain: Secondary | ICD-10-CM

## 2012-07-19 ENCOUNTER — Ambulatory Visit
Admission: RE | Admit: 2012-07-19 | Discharge: 2012-07-19 | Disposition: A | Discharge status: Home / Self Care | Payer: BC Managed Care – PPO | Source: Ambulatory Visit | Attending: Gastroenterology | Admitting: Gastroenterology

## 2012-07-19 DIAGNOSIS — R109 Unspecified abdominal pain: Secondary | ICD-10-CM

## 2012-07-19 MED ORDER — IOHEXOL 300 MG/ML  SOLN
125.0000 mL | Freq: Once | INTRAMUSCULAR | Status: AC | PRN
  Administered 2012-07-19: 125 mL via INTRAVENOUS

## 2012-08-03 ENCOUNTER — Encounter (INDEPENDENT_AMBULATORY_CARE_PROVIDER_SITE_OTHER): Payer: Self-pay | Admitting: General Surgery

## 2012-08-03 ENCOUNTER — Encounter (INDEPENDENT_AMBULATORY_CARE_PROVIDER_SITE_OTHER): Payer: Self-pay

## 2012-08-03 ENCOUNTER — Ambulatory Visit (INDEPENDENT_AMBULATORY_CARE_PROVIDER_SITE_OTHER): Payer: BC Managed Care – PPO | Admitting: General Surgery

## 2012-08-03 ENCOUNTER — Telehealth: Payer: Self-pay | Admitting: Cardiology

## 2012-08-03 VITALS — BP 128/86 | HR 71 | Temp 98.4°F | Resp 16 | Ht 76.0 in | Wt 272.8 lb

## 2012-08-03 DIAGNOSIS — K4090 Unilateral inguinal hernia, without obstruction or gangrene, not specified as recurrent: Secondary | ICD-10-CM

## 2012-08-03 MED ORDER — HYDROCODONE-ACETAMINOPHEN 5-325 MG PO TABS: 1.0000 | ORAL_TABLET | Freq: Four times a day (QID) | ORAL | Status: DC | PRN

## 2012-08-03 NOTE — Telephone Encounter (Signed)
Spoke with pt, he is currently not having his protime checked. appt made for pt to have INR Friday this week. Dr Cheryln Manly is going to be doing the surgery in about one month.

## 2012-08-03 NOTE — Telephone Encounter (Signed)
New Problem:    Patient called in because he has a hernia and needs a release sent to his surgeon stating that he would be able to stop his warfarin (COUMADIN) 5 MG tablet for 5 days prior to his procedure.  Please call back.

## 2012-08-03 NOTE — Progress Notes (Signed)
Patient ID: [name], male   DOB: 05/21/1956, 56 y.o.   MRN: [id number]  Chief Complaint  Patient presents with  . Umbilical Hernia    HPI [name] is a 56 y.o. male.  This patient is referred by Dr. Ottelin for evaluation of a right inguinal hernia. He says that he has had some right lower quadrant abdominal pain and right-sided groin pain since December and has had some associated nausea but no significant vomiting. He says that this is right sided groin pain which he describes as "dull" which radiates to his back. He says that this is lifestyle limiting and does cause significant discomfort. He says that his bowels are normal and no other obstructive symptoms. He says that this may have started after episode of constipation in December where he was straining frequently to move his bowels. He is on Coumadin for atrial fibrillation and is followed by his cardiologist Dr. Crenshaw for this HPI  Past Medical History  Diagnosis Date  . PAF (paroxysmal atrial fibrillation)   . HLD (hyperlipidemia)   . HTN (hypertension)   . Depression   . Allergic rhinitis   . NICM (nonischemic cardiomyopathy)   . Narcotic addiction   . Inguinal hernia without mention of obstruction or gangrene, unilateral or unspecified, (not specified as recurrent)   . Hypertrophy of prostate with urinary obstruction and other lower urinary tract symptoms (LUTS)   . Acquired cyst of kidney     Past Surgical History  Procedure Date  . Atrial ablation surgery 2007    [facility]  . Cardioversion 08/11/2011    Procedure: CARDIOVERSION;  Surgeon: Naksh Radi S Crenshaw, MD;  Location: [facility] OR;  Service: Cardiovascular;  Laterality: N/A;    Family History  Problem Relation Age of Onset  . Asthma    . Breast cancer    . Lymphoma    . COPD      Social History History  Substance Use Topics  . Smoking status: Never Smoker   . Smokeless tobacco: Not on file  . Alcohol Use: No    No Known Allergies  Current  Outpatient Prescriptions  Medication Sig Dispense Refill  . acetaminophen (TYLENOL) 325 MG tablet Take 650 mg by mouth every 6 (six) hours as needed. For pain      . aspirin 81 MG tablet Take 81 mg by mouth daily.      . DULoxetine (CYMBALTA) 30 MG capsule Take 30 mg by mouth daily.        . lisinopril (PRINIVIL,ZESTRIL) 40 MG tablet Take 1 tablet (40 mg total) by mouth daily.  30 tablet  12  . montelukast (SINGULAIR) 10 MG tablet Take 10 mg by mouth at bedtime.        . TAZTIA XT 360 MG 24 hr capsule TAKE 1 CAPSULE BY MOUTH EVERY MORNING  30 capsule  0  . warfarin (COUMADIN) 5 MG tablet Take 1 tablet (5 mg total) by mouth daily.  30 tablet  6  . HYDROcodone-acetaminophen (NORCO) 5-325 MG per tablet Take 1 tablet by mouth every 6 (six) hours as needed for pain.  45 tablet  0    Review of Systems Review of Systems All other review of systems negative or noncontributory except as stated in the HPI  Blood pressure 128/86, pulse 71, temperature 98.4 F (36.9 C), temperature source Temporal, resp. rate 16, height 6' 4" (1.93 m), weight 272 lb 12.8 oz (123.741 kg).  Physical Exam Physical Exam Physical Exam  Vitals   reviewed. Constitutional: He is oriented to person, place, and time. He appears well-developed and well-nourished. No distress.  HENT:  Head: Normocephalic and atraumatic.  Mouth/Throat: No oropharyngeal exudate.  Eyes: Conjunctivae and EOM are normal. Pupils are equal, round, and reactive to light. Right eye exhibits no discharge. Left eye exhibits no discharge. No scleral icterus.  Neck: Normal range of motion. No tracheal deviation present.  Cardiovascular: Normal rate, regular rhythm and normal heart sounds.   Pulmonary/Chest: Effort normal and breath sounds normal. No stridor. No respiratory distress. He has no wheezes. He has no rales. He exhibits no tenderness.  Abdominal: Soft. Bowel sounds are normal. He exhibits no distension and no mass. There is no tenderness. There is  no rebound and no guarding. he has a small reducible umbilical hernia. He also has a reducible right inguinal hernia on exam and no obvious left inguinal hernia on exam Musculoskeletal: Normal range of motion. He exhibits no edema and no tenderness.  Neurological: He is alert and oriented to person, place, and time.  Skin: Skin is warm and dry. No rash noted. He is not diaphoretic. No erythema. No pallor.  Psychiatric: He has a normal mood and affect. His behavior is normal. Judgment and thought content normal.    Data Reviewed Outside records and CT scan  Assessment    Reducible right inguinal hernia and umbilical hernia He has a fairly symptomatic right inguinal hernia as well as a small umbilical hernia. We discussed the options for open or laparoscopic repair and we will plan for laparoscopic repair of his right inguinal hernia as well as repair of his umbilical hernia as well. There is some question of a CT scan of the possible left angle hernia as well and I think that I would plan for a transabdominal approach. If he has a left inguinal hernia and we could address this as well. I discussed with him the pros and cons of the procedure and the risks and benefits including infection, bleeding, pain, scarring, persistent pain and chronic pain, injury to the testicle or vas deferens and recurrence and he expressed understanding and desires to proceed with planned procedure. We also discussed the possibility of the need for open surgery and he was in agreement of this as well.    Plan    We'll plan for laparoscopic transabdominal inguinal hernia repair with mesh and umbilical hernia repair as soon as convenient and as soon as we can receive clearance from his cardiologist and instructions with regard to stopping his Coumadin. I will have a low threshold for open repair of his hernia especially given his Coumadin use       Emillie Chasen DAVID 08/03/2012, 4:47 PM    

## 2012-08-04 ENCOUNTER — Telehealth (INDEPENDENT_AMBULATORY_CARE_PROVIDER_SITE_OTHER): Payer: Self-pay

## 2012-08-04 NOTE — Telephone Encounter (Signed)
Patient wife called to say [name] is in extreme pain and has n/v. I advised her to take him to ER for evaluation.

## 2012-08-09 ENCOUNTER — Other Ambulatory Visit: Payer: Self-pay | Admitting: Cardiology

## 2012-08-18 ENCOUNTER — Encounter (HOSPITAL_COMMUNITY): Payer: Self-pay

## 2012-08-18 ENCOUNTER — Encounter (HOSPITAL_COMMUNITY)
Admission: RE | Admit: 2012-08-18 | Discharge: 2012-08-18 | Disposition: A | Discharge status: Home / Self Care | Payer: BC Managed Care – PPO | Source: Ambulatory Visit | Attending: Anesthesiology | Admitting: Anesthesiology

## 2012-08-18 ENCOUNTER — Encounter (HOSPITAL_COMMUNITY)
Admission: RE | Admit: 2012-08-18 | Discharge: 2012-08-18 | Disposition: A | Discharge status: Home / Self Care | Payer: BC Managed Care – PPO | Source: Ambulatory Visit | Attending: General Surgery | Admitting: General Surgery

## 2012-08-18 VITALS — BP 143/95 | HR 73 | Temp 97.9°F | Resp 18 | Ht 76.0 in | Wt 275.2 lb

## 2012-08-18 DIAGNOSIS — K4090 Unilateral inguinal hernia, without obstruction or gangrene, not specified as recurrent: Secondary | ICD-10-CM

## 2012-08-18 HISTORY — DX: Other specified postprocedural states: R11.2

## 2012-08-18 HISTORY — DX: Other specified postprocedural states: Z98.890

## 2012-08-18 HISTORY — DX: Other complications of anesthesia, initial encounter: T88.59XA

## 2012-08-18 HISTORY — DX: Unspecified asthma, uncomplicated: J45.909

## 2012-08-18 HISTORY — DX: Pneumonia, unspecified organism: J18.9

## 2012-08-18 HISTORY — DX: Adverse effect of unspecified anesthetic, initial encounter: T41.45XA

## 2012-08-18 HISTORY — DX: Other intervertebral disc displacement, lumbar region: M51.26

## 2012-08-18 LAB — CBC
HCT: 44.4 % (ref 39.0–52.0)
MCHC: 34.5 g/dL (ref 30.0–36.0)
RDW: 13.1 % (ref 11.5–15.5)

## 2012-08-18 LAB — BASIC METABOLIC PANEL
BUN: 19 mg/dL (ref 6–23)
Calcium: 9.5 mg/dL (ref 8.4–10.5)
Creatinine, Ser: 1 mg/dL (ref 0.50–1.35)
GFR calc Af Amer: >90 mL/min (ref >90)
GFR calc non Af Amer: 83 mL/min — ABNORMAL LOW (ref >90)

## 2012-08-18 LAB — PROTIME-INR: INR: 1.09 (ref 0.00–1.49)

## 2012-08-18 NOTE — Progress Notes (Signed)
Contacted Dr. Katrinka Blazing, anesthesia requested consult with patient per surgeons orders. Patient and wife instructed to remain after have blood work and CXR done for consult.

## 2012-08-18 NOTE — Pre-Procedure Instructions (Signed)
[name]  08/18/2012   Your procedure is scheduled on:  Thursday August 26, 2012  Report to [facility] at 10:00 AM.  Call this number if you have problems the morning of surgery: [phone]   Remember:   Do not eat food or drink liquids after midnight.   Take these medicines the morning of surgery with A SIP OF WATER: diltiazem, cymbalta, hydrocodone, singulair,   Do not wear jewelry, make-up or nail polish.  Do not wear lotions, powders, or perfumes..  Do not shave 48 hours prior to surgery..  Do not bring valuables to the hospital.  Contacts, dentures or bridgework may not be worn into surgery.  Leave suitcase in the car. After surgery it may be brought to your room.  For patients admitted to the hospital, checkout time is 11:00 AM the day of  discharge.   Patients discharged the day of surgery will not be allowed to drive  home.  Name and phone number of your driver: family / friend  Special Instructions: Shower using CHG 2 nights before surgery and the night before surgery.  If you shower the day of surgery use CHG.  Use special wash - you have one bottle of CHG for all showers.  You should use approximately 1/3 of the bottle for each shower.   Please read over the following fact sheets that you were given: Pain Booklet, Coughing and Deep Breathing, MRSA Information and Surgical Site Infection Prevention

## 2012-08-18 NOTE — Progress Notes (Signed)
08/18/12 1322  OBSTRUCTIVE SLEEP APNEA  Have you ever been diagnosed with sleep apnea through a sleep study? No  Do you snore loudly (loud enough to be heard through closed doors)?  1  Do you often feel tired, fatigued, or sleepy during the daytime? 0  Has anyone observed you stop breathing during your sleep? 0  Do you have, or are you being treated for high blood pressure? 1  BMI more than 35 kg/m2? 0  Age over 56 years old? 1  Neck circumference greater than 40 cm/18 inches? 0 (17 1/2 inches)  Gender: 1  Obstructive Sleep Apnea Score 4  Score 4 or greater  Results sent to PCP

## 2012-08-18 NOTE — Progress Notes (Signed)
Patient left prior to consult with Dr. Katrinka Blazing.  Notified Dr. Katrinka Blazing, states anesthesia to follow up with patient day of surgery.

## 2012-08-19 ENCOUNTER — Other Ambulatory Visit (HOSPITAL_COMMUNITY): Payer: BC Managed Care – PPO

## 2012-08-19 NOTE — Consult Note (Addendum)
Anesthesia Chart Review:  Patient is a 57 year old male scheduled for laparoscopic versus open right IHR with mesh by Dr. Biagio Quint on 08/26/12.  History includes non-smoker, nonischemic cardiomyopathy with improved EF 60-65% in 2012, afib/PAF s/p ablation and multiple DCCV (last 08/2011), HTN, BPH, HLD, asthma, renal cyst, depression, narcotic addiction.  For anesthesia history, he reports that he has post-operative N/V and is "hard to put asleep, hard to wake up."  Cardiologist is Dr. [name], last visit 03/31/12.  EP Cardiologist Dr. Johney Frame.  By notes, afib was diagnosed in 2004 with cath then showing normal coronaries with EF 45%.  He had 4 cardioversions and failed medical therapy, so he ultimately underwent ablation in 2007.  In 2010 had recurrent afib and underwent another cardioversion on 02/14/09 and 06/08/09, and most recently again on 08/11/11. He was maintaining SR at his last cardiology visit in September 2013.  There are notes in Epic indicating both Dr. Biagio Quint and patient notified Dr. [name] of planned procedure.    EKG on 03/31/12 showed NSR, incomplete right BBB, non-specific ST/T wave abnormality.  Echo on 06/16/11 showed: Left ventricle: The cavity size was normal. Wall thickness was normal. Systolic function was normal. The estimated ejection fraction was in the range of 60% to 65%. Features are consistent with a pseudonormal left ventricular filling pattern, with concomitant abnormal relaxation and increased filling pressure (grade 2 diastolic dysfunction).  CXR on 08/18/12 showed chronic interstitial lung markings. Bronchial thickening. No infiltrate or collapse.  Preoperative labs noted.  PT/INR WNL.  If he has not had any recent anticoagulation therapy, I do not think he would require any additional preoperative labs.  Anesthesiologist Dr. Katrinka Blazing was notified of history at patient's PAT appointment.  Plan for him to be evaluated on the day of surgery.  At his appointment with Dr.  Biagio Quint on 08/03/12, his heart sounds were described as regular.  His HR was 73 yesterday.  He remains on diltiazem.  If rhythm remains stable, then would anticipate he could proceed as planned.  Shonna Chock, PA-C 08/19/12 [phone]

## 2012-08-25 ENCOUNTER — Ambulatory Visit (INDEPENDENT_AMBULATORY_CARE_PROVIDER_SITE_OTHER): Payer: BC Managed Care – PPO | Admitting: Pharmacist

## 2012-08-25 ENCOUNTER — Telehealth: Payer: Self-pay | Admitting: Cardiology

## 2012-08-25 ENCOUNTER — Encounter (INDEPENDENT_AMBULATORY_CARE_PROVIDER_SITE_OTHER): Payer: Self-pay

## 2012-08-25 DIAGNOSIS — I4891 Unspecified atrial fibrillation: Secondary | ICD-10-CM

## 2012-08-25 MED ORDER — CEFAZOLIN SODIUM-DEXTROSE 2-3 GM-% IV SOLR: 2.0000 g | INTRAVENOUS | Status: DC

## 2012-08-26 ENCOUNTER — Ambulatory Visit (HOSPITAL_COMMUNITY): Payer: BC Managed Care – PPO | Admitting: Certified Registered"

## 2012-08-26 ENCOUNTER — Encounter (HOSPITAL_COMMUNITY): Payer: Self-pay | Admitting: *Deleted

## 2012-08-26 ENCOUNTER — Encounter (HOSPITAL_COMMUNITY): Payer: Self-pay | Admitting: Vascular Surgery

## 2012-08-26 ENCOUNTER — Encounter (HOSPITAL_COMMUNITY)
Admission: RE | Disposition: A | Discharge status: Home / Self Care | Payer: Self-pay | Source: Ambulatory Visit | Attending: General Surgery

## 2012-08-26 ENCOUNTER — Ambulatory Visit (HOSPITAL_COMMUNITY)
Admission: RE | Admit: 2012-08-26 | Discharge: 2012-08-26 | Disposition: A | Discharge status: Home / Self Care | Payer: BC Managed Care – PPO | Source: Ambulatory Visit | Attending: General Surgery | Admitting: General Surgery

## 2012-08-26 DIAGNOSIS — I10 Essential (primary) hypertension: Secondary | ICD-10-CM | POA: Insufficient documentation

## 2012-08-26 DIAGNOSIS — Z01812 Encounter for preprocedural laboratory examination: Secondary | ICD-10-CM | POA: Insufficient documentation

## 2012-08-26 DIAGNOSIS — K429 Umbilical hernia without obstruction or gangrene: Secondary | ICD-10-CM | POA: Insufficient documentation

## 2012-08-26 DIAGNOSIS — K4090 Unilateral inguinal hernia, without obstruction or gangrene, not specified as recurrent: Secondary | ICD-10-CM

## 2012-08-26 DIAGNOSIS — Z7901 Long term (current) use of anticoagulants: Secondary | ICD-10-CM | POA: Insufficient documentation

## 2012-08-26 DIAGNOSIS — I509 Heart failure, unspecified: Secondary | ICD-10-CM | POA: Insufficient documentation

## 2012-08-26 DIAGNOSIS — I4891 Unspecified atrial fibrillation: Secondary | ICD-10-CM | POA: Insufficient documentation

## 2012-08-26 DIAGNOSIS — Z01818 Encounter for other preprocedural examination: Secondary | ICD-10-CM | POA: Insufficient documentation

## 2012-08-26 HISTORY — PX: INGUINAL HERNIA REPAIR: SHX194

## 2012-08-26 HISTORY — PX: INSERTION OF MESH: SHX5868

## 2012-08-26 HISTORY — PX: UMBILICAL HERNIA REPAIR: SHX196

## 2012-08-26 SURGERY — REPAIR, HERNIA, INGUINAL, LAPAROSCOPIC
Anesthesia: General | Site: Abdomen | Laterality: Right | Wound class: Clean Contaminated

## 2012-08-26 MED ORDER — METOCLOPRAMIDE HCL 5 MG/ML IJ SOLN: 10.0000 mg | Freq: Once | INTRAMUSCULAR | Status: DC | PRN

## 2012-08-26 MED ORDER — PROPOFOL 10 MG/ML IV BOLUS
INTRAVENOUS | Status: DC | PRN
  Administered 2012-08-26: 200 mg via INTRAVENOUS

## 2012-08-26 MED ORDER — HYDROMORPHONE HCL PF 1 MG/ML IJ SOLN
INTRAMUSCULAR | Status: AC
  Filled 2012-08-26: qty 1

## 2012-08-26 MED ORDER — OXYCODONE HCL 5 MG PO TABS: 5.0000 mg | ORAL_TABLET | Freq: Once | ORAL | Status: DC | PRN

## 2012-08-26 MED ORDER — BUPIVACAINE HCL (PF) 0.25 % IJ SOLN
INTRAMUSCULAR | Status: AC
  Filled 2012-08-26: qty 30

## 2012-08-26 MED ORDER — FENTANYL CITRATE 0.05 MG/ML IJ SOLN
INTRAMUSCULAR | Status: DC | PRN
  Administered 2012-08-26: 25 ug via INTRAVENOUS
  Administered 2012-08-26: 50 ug via INTRAVENOUS
  Administered 2012-08-26 (×2): 100 ug via INTRAVENOUS

## 2012-08-26 MED ORDER — HYDROMORPHONE HCL PF 1 MG/ML IJ SOLN
INTRAMUSCULAR | Status: AC
  Administered 2012-08-26: 1 mg
  Filled 2012-08-26: qty 1

## 2012-08-26 MED ORDER — ROCURONIUM BROMIDE 100 MG/10ML IV SOLN
INTRAVENOUS | Status: DC | PRN
  Administered 2012-08-26 (×3): 10 mg via INTRAVENOUS
  Administered 2012-08-26: 60 mg via INTRAVENOUS
  Administered 2012-08-26: 10 mg via INTRAVENOUS

## 2012-08-26 MED ORDER — GLYCOPYRROLATE 0.2 MG/ML IJ SOLN
INTRAMUSCULAR | Status: DC | PRN
  Administered 2012-08-26: .8 mg via INTRAVENOUS

## 2012-08-26 MED ORDER — SCOPOLAMINE 1 MG/3DAYS TD PT72
MEDICATED_PATCH | TRANSDERMAL | Status: AC
  Administered 2012-08-26: 1.5 mg
  Filled 2012-08-26: qty 1

## 2012-08-26 MED ORDER — DEXTROSE 5 % IV SOLN
3.0000 g | Freq: Once | INTRAVENOUS | Status: AC
  Administered 2012-08-26: 3 g via INTRAVENOUS
  Filled 2012-08-26: qty 3000

## 2012-08-26 MED ORDER — CEFAZOLIN SODIUM 1-5 GM-% IV SOLN
INTRAVENOUS | Status: AC
  Filled 2012-08-26: qty 50

## 2012-08-26 MED ORDER — ONDANSETRON HCL 4 MG/2ML IJ SOLN
INTRAMUSCULAR | Status: DC | PRN
  Administered 2012-08-26: 4 mg via INTRAVENOUS

## 2012-08-26 MED ORDER — LIDOCAINE-EPINEPHRINE (PF) 1 %-1:200000 IJ SOLN
INTRAMUSCULAR | Status: DC | PRN
  Administered 2012-08-26: 14:00:00

## 2012-08-26 MED ORDER — DEXAMETHASONE SODIUM PHOSPHATE 10 MG/ML IJ SOLN
INTRAMUSCULAR | Status: DC | PRN
  Administered 2012-08-26: 8 mg via INTRAVENOUS

## 2012-08-26 MED ORDER — PHENYLEPHRINE HCL 10 MG/ML IJ SOLN
INTRAMUSCULAR | Status: DC | PRN
  Administered 2012-08-26: 80 ug via INTRAVENOUS
  Administered 2012-08-26: 40 ug via INTRAVENOUS
  Administered 2012-08-26: 80 ug via INTRAVENOUS
  Administered 2012-08-26: 40 ug via INTRAVENOUS

## 2012-08-26 MED ORDER — LIDOCAINE-EPINEPHRINE (PF) 1 %-1:200000 IJ SOLN
INTRAMUSCULAR | Status: AC
  Filled 2012-08-26: qty 10

## 2012-08-26 MED ORDER — SCOPOLAMINE 1 MG/3DAYS TD PT72
MEDICATED_PATCH | TRANSDERMAL | Status: DC | PRN
  Administered 2012-08-26: 1 via TRANSDERMAL

## 2012-08-26 MED ORDER — EVICEL 5 ML EX KIT
PACK | CUTANEOUS | Status: DC | PRN
  Administered 2012-08-26: 5 mL

## 2012-08-26 MED ORDER — ARTIFICIAL TEARS OP OINT
TOPICAL_OINTMENT | OPHTHALMIC | Status: DC | PRN
  Administered 2012-08-26: 1 via OPHTHALMIC

## 2012-08-26 MED ORDER — OXYCODONE HCL 5 MG/5ML PO SOLN: 5.0000 mg | Freq: Once | ORAL | Status: DC | PRN

## 2012-08-26 MED ORDER — LACTATED RINGERS IV SOLN
INTRAVENOUS | Status: DC
  Administered 2012-08-26: 11:00:00 via INTRAVENOUS

## 2012-08-26 MED ORDER — ONDANSETRON HCL 4 MG/2ML IJ SOLN
INTRAMUSCULAR | Status: AC
  Filled 2012-08-26: qty 2

## 2012-08-26 MED ORDER — CEFAZOLIN SODIUM-DEXTROSE 2-3 GM-% IV SOLR
INTRAVENOUS | Status: AC
  Filled 2012-08-26: qty 50

## 2012-08-26 MED ORDER — NEOSTIGMINE METHYLSULFATE 1 MG/ML IJ SOLN
INTRAMUSCULAR | Status: DC | PRN
  Administered 2012-08-26: 5 mg via INTRAVENOUS

## 2012-08-26 MED ORDER — OXYCODONE HCL 5 MG PO TABS: 5.0000 mg | ORAL_TABLET | ORAL | Status: DC | PRN

## 2012-08-26 MED ORDER — MIDAZOLAM HCL 5 MG/5ML IJ SOLN
INTRAMUSCULAR | Status: DC | PRN
  Administered 2012-08-26: 2 mg via INTRAVENOUS

## 2012-08-26 MED ORDER — LACTATED RINGERS IV SOLN
INTRAVENOUS | Status: DC | PRN
  Administered 2012-08-26: 11:00:00 via INTRAVENOUS

## 2012-08-26 MED ORDER — HYDROMORPHONE HCL PF 1 MG/ML IJ SOLN
INTRAMUSCULAR | Status: AC
  Administered 2012-08-26: 0.5 mg via INTRAVENOUS
  Filled 2012-08-26: qty 1

## 2012-08-26 MED ORDER — LIDOCAINE HCL (CARDIAC) 20 MG/ML IV SOLN
INTRAVENOUS | Status: DC | PRN
  Administered 2012-08-26: 100 mg via INTRAVENOUS

## 2012-08-26 MED ORDER — HYDROMORPHONE HCL PF 1 MG/ML IJ SOLN
0.2500 mg | INTRAMUSCULAR | Status: DC | PRN
  Administered 2012-08-26 (×3): 0.5 mg via INTRAVENOUS

## 2012-08-26 SURGICAL SUPPLY — 70 items
ADH SKN CLS APL DERMABOND .7 (GAUZE/BANDAGES/DRESSINGS) ×2
APL SKNCLS STERI-STRIP NONHPOA (GAUZE/BANDAGES/DRESSINGS)
APPLICATOR COTTON TIP 6IN STRL (MISCELLANEOUS) IMPLANT
APPLIER CLIP 5 13 M/L LIGAMAX5 (MISCELLANEOUS)
APPLIER CLIP ROT 10 11.4 M/L (STAPLE)
APR CLP MED LRG 11.4X10 (STAPLE)
APR CLP MED LRG 5 ANG JAW (MISCELLANEOUS)
BALL CTTN LRG ABS STRL LF (GAUZE/BANDAGES/DRESSINGS)
BENZOIN TINCTURE PRP APPL 2/3 (GAUZE/BANDAGES/DRESSINGS) IMPLANT
BLADE SURG 10 STRL SS (BLADE) ×3 IMPLANT
BLADE SURG 12 STRL SS (BLADE) ×3 IMPLANT
BLADE SURG 15 STRL LF DISP TIS (BLADE) ×2 IMPLANT
BLADE SURG 15 STRL SS (BLADE) ×3
BLADE SURG ROTATE 9660 (MISCELLANEOUS) IMPLANT
CANISTER SUCTION 2500CC (MISCELLANEOUS) ×1 IMPLANT
CHLORAPREP W/TINT 26ML (MISCELLANEOUS) ×3 IMPLANT
CLIP APPLIE 5 13 M/L LIGAMAX5 (MISCELLANEOUS) IMPLANT
CLIP APPLIE ROT 10 11.4 M/L (STAPLE) IMPLANT
CLOTH BEACON ORANGE TIMEOUT ST (SAFETY) ×3 IMPLANT
COTTONBALL LRG STERILE PKG (GAUZE/BANDAGES/DRESSINGS) ×2 IMPLANT
COVER SURGICAL LIGHT HANDLE (MISCELLANEOUS) ×3 IMPLANT
DECANTER SPIKE VIAL GLASS SM (MISCELLANEOUS) ×4 IMPLANT
DERMABOND ADVANCED (GAUZE/BANDAGES/DRESSINGS) ×1
DERMABOND ADVANCED .7 DNX12 (GAUZE/BANDAGES/DRESSINGS) ×2 IMPLANT
DEVICE SECURE STRAP 25 ABSORB (INSTRUMENTS) ×3 IMPLANT
DISSECT BALLN SPACEMKR + OVL (BALLOONS)
DISSECTOR BALLN SPACEMKR + OVL (BALLOONS) ×2 IMPLANT
DRAPE PED LAPAROTOMY (DRAPES) ×3 IMPLANT
DRAPE UTILITY 15X26 W/TAPE STR (DRAPE) ×6 IMPLANT
DRSG TEGADERM 4X4.75 (GAUZE/BANDAGES/DRESSINGS) ×3 IMPLANT
ELECT CAUTERY BLADE 6.4 (BLADE) ×3 IMPLANT
ELECT COATED BLADE 2.86 ST (ELECTRODE) ×3 IMPLANT
ELECT REM PT RETURN 9FT ADLT (ELECTROSURGICAL) ×3
ELECTRODE REM PT RTRN 9FT ADLT (ELECTROSURGICAL) ×2 IMPLANT
GLOVE BIOGEL PI IND STRL 7.5 (GLOVE) IMPLANT
GLOVE BIOGEL PI INDICATOR 7.5 (GLOVE) ×1
GLOVE SURG SS PI 7.5 STRL IVOR (GLOVE) ×7 IMPLANT
GOWN STRL NON-REIN LRG LVL3 (GOWN DISPOSABLE) ×6 IMPLANT
GOWN STRL REIN XL XLG (GOWN DISPOSABLE) ×3 IMPLANT
KIT BASIN OR (CUSTOM PROCEDURE TRAY) ×3 IMPLANT
KIT ROOM TURNOVER OR (KITS) ×3 IMPLANT
MESH ULTRAPRO 6X6 15CM15CM (Mesh General) ×1 IMPLANT
NDL HYPO 25GX1X1/2 BEV (NEEDLE) ×2 IMPLANT
NEEDLE HYPO 25GX1X1/2 BEV (NEEDLE) ×3 IMPLANT
NS IRRIG 1000ML POUR BTL (IV SOLUTION) ×3 IMPLANT
PACK SURGICAL SETUP 50X90 (CUSTOM PROCEDURE TRAY) ×3 IMPLANT
PAD ARMBOARD 7.5X6 YLW CONV (MISCELLANEOUS) ×6 IMPLANT
PENCIL BUTTON HOLSTER BLD 10FT (ELECTRODE) ×3 IMPLANT
SCISSORS LAP 5X35 DISP (ENDOMECHANICALS) IMPLANT
SET IRRIG TUBING LAPAROSCOPIC (IRRIGATION / IRRIGATOR) ×1 IMPLANT
SPONGE LAP 18X18 X RAY DECT (DISPOSABLE) ×3 IMPLANT
STRIP CLOSURE SKIN 1/2X4 (GAUZE/BANDAGES/DRESSINGS) IMPLANT
SUT ETHIBOND 0 MO6 C/R (SUTURE) ×2 IMPLANT
SUT MNCRL AB 4-0 PS2 18 (SUTURE) ×3 IMPLANT
SUT PROLENE 2 0 SH DA (SUTURE) ×8 IMPLANT
SUT VIC AB 3-0 SH 27 (SUTURE)
SUT VIC AB 3-0 SH 27X BRD (SUTURE) ×2 IMPLANT
SYR BULB 3OZ (MISCELLANEOUS) ×3 IMPLANT
SYR CONTROL 10ML LL (SYRINGE) ×3 IMPLANT
SYRINGE 10CC LL (SYRINGE) ×1 IMPLANT
TIP RIGID 35CM EVICEL (HEMOSTASIS) ×1 IMPLANT
TOWEL OR 17X24 6PK STRL BLUE (TOWEL DISPOSABLE) ×3 IMPLANT
TOWEL OR 17X26 10 PK STRL BLUE (TOWEL DISPOSABLE) ×3 IMPLANT
TRAY FOLEY CATH 14FR (SET/KITS/TRAYS/PACK) ×3 IMPLANT
TRAY LAPAROSCOPIC (CUSTOM PROCEDURE TRAY) ×3 IMPLANT
TROCAR BALLN 12MMX100 BLUNT (TROCAR) ×1 IMPLANT
TROCAR XCEL BLADELESS 5X75MML (TROCAR) ×6 IMPLANT
TUBE CONNECTING 12X1/4 (SUCTIONS) ×1 IMPLANT
TUBING INSUFFLATION 10FT LAP (TUBING) ×1 IMPLANT
YANKAUER SUCT BULB TIP NO VENT (SUCTIONS) ×1 IMPLANT

## 2012-08-26 NOTE — Anesthesia Preprocedure Evaluation (Addendum)
Anesthesia Evaluation  Patient identified by MRN, date of birth, ID band Patient awake    Reviewed: Allergy & Precautions, H&P , NPO status , Patient's Chart, lab work & pertinent test results, reviewed documented beta blocker date and time   History of Anesthesia Complications (+) PONV and PROLONGED EMERGENCE  Airway Mallampati: II TM Distance: >3 FB Neck ROM: full    Dental   Pulmonary asthma , pneumonia -, resolved,  breath sounds clear to auscultation        Cardiovascular hypertension, On Medications +CHF + dysrhythmias Atrial Fibrillation Rhythm:regular     Neuro/Psych PSYCHIATRIC DISORDERS negative neurological ROS     GI/Hepatic negative GI ROS, (+)     substance abuse   ,   Endo/Other  negative endocrine ROS  Renal/GU Renal disease  negative genitourinary   Musculoskeletal   Abdominal   Peds  Hematology negative hematology ROS (+)   Anesthesia Other Findings See surgeon's H&P   Reproductive/Obstetrics negative OB ROS                          Anesthesia Physical Anesthesia Plan  ASA: III  Anesthesia Plan: General   Post-op Pain Management:    Induction: Intravenous  Airway Management Planned: Oral ETT  Additional Equipment:   Intra-op Plan:   Post-operative Plan: Extubation in OR  Informed Consent: I have reviewed the patients History and Physical, chart, labs and discussed the procedure including the risks, benefits and alternatives for the proposed anesthesia with the patient or authorized representative who has indicated his/her understanding and acceptance.   Dental Advisory Given  Plan Discussed with: CRNA and Surgeon  Anesthesia Plan Comments:        Anesthesia Quick Evaluation

## 2012-08-26 NOTE — Brief Op Note (Signed)
08/26/2012  2:12 PM  PATIENT:  Alonza Bogus  57 y.o. male  PRE-OPERATIVE DIAGNOSIS:  Repair defect in abdominal wall  POST-OPERATIVE DIAGNOSIS:  Right Inguinal Hernia and Umbilical Hernia  PROCEDURE:  Procedure(s) with comments: LAPAROSCOPIC INGUINAL HERNIA (Right) - laparoscopic right inguinal hernia repair with mesh, umbilical hernia repair INSERTION OF MESH (Right) - right inguinal hernia HERNIA REPAIR UMBILICAL ADULT (N/A)  SURGEON:  Surgeon(s) and Role:    * Lodema Pilot, DO - Primary  PHYSICIAN ASSISTANT:   ASSISTANTS: none   ANESTHESIA:   general  EBL:  Total I/O In: 2000 [I.V.:2000] Out: 350 [Urine:350]  BLOOD ADMINISTERED:none  DRAINS: none   LOCAL MEDICATIONS USED:  MARCAINE    and LIDOCAINE   SPECIMEN:  No Specimen  DISPOSITION OF SPECIMEN:  N/A  COUNTS:  YES  TOURNIQUET:  * No tourniquets in log *  DICTATION: .Other Dictation: Dictation Number dictated  PLAN OF CARE: Discharge to home after PACU  PATIENT DISPOSITION:  PACU - hemodynamically stable.   Delay start of Pharmacological VTE agent (>24hrs) due to surgical blood loss or risk of bleeding: no

## 2012-08-26 NOTE — Interval H&P Note (Signed)
History and Physical Interval Note:  08/26/2012 11:08 AM  [name]  has presented today for surgery, with the diagnosis of Right Inguinal Hernia and Umbilical Hernia  The various methods of treatment have been discussed with the patient and family. After consideration of risks, benefits and other options for treatment, the patient has consented to  Procedure(s) with comments: LAPAROSCOPIC INGUINAL HERNIA (Right) - laparoscopic right inguinal hernia repair with mesh possible open, umbilical hernia repair INSERTION OF MESH (Right) HERNIA REPAIR UMBILICAL ADULT (N/A) as a surgical intervention .  The patient's history has been reviewed, patient examined, no change in status, stable for surgery.  I have reviewed the patient's chart and labs.  Questions were answered to the patient's satisfaction.  Pt. Seen in preop area.  He does have a RIH on exam and umbilical hernia.  Sites marked.  We also discussed the possibility of finding a LIH and possible repair of this.  He would like to have this repaired if found.  I reviewed with him the risks of the procedure including infection, bleeding, pain, scarring, recurrence, injury to bowel or testicle or vas deferens, need for open, persistent pain, and negative laparoscopy and he expressed understanding and desires to proceed with lap possible open RIH and umbilical hernia repair with mesh and possible LIH.   Lodema Pilot DAVID

## 2012-08-26 NOTE — H&P (View-Only) (Signed)
Patient ID: [name], male   DOB: December 09, 1955, 57 y.o.   MRN: [id number]  Chief Complaint  Patient presents with  . Umbilical Hernia    HPI [name] is a 57 y.o. male.  This patient is referred by Dr. Vernie Ammons for evaluation of a right inguinal hernia. He says that he has had some right lower quadrant abdominal pain and right-sided groin pain since December and has had some associated nausea but no significant vomiting. He says that this is right sided groin pain which he describes as "dull" which radiates to his back. He says that this is lifestyle limiting and does cause significant discomfort. He says that his bowels are normal and no other obstructive symptoms. He says that this may have started after episode of constipation in December where he was straining frequently to move his bowels. He is on Coumadin for atrial fibrillation and is followed by his cardiologist Dr. [name] for this HPI  Past Medical History  Diagnosis Date  . PAF (paroxysmal atrial fibrillation)   . HLD (hyperlipidemia)   . HTN (hypertension)   . Depression   . Allergic rhinitis   . NICM (nonischemic cardiomyopathy)   . Narcotic addiction   . Inguinal hernia without mention of obstruction or gangrene, unilateral or unspecified, (not specified as recurrent)   . Hypertrophy of prostate with urinary obstruction and other lower urinary tract symptoms (LUTS)   . Acquired cyst of kidney     Past Surgical History  Procedure Date  . Atrial ablation surgery 2007    [facility]  . Cardioversion 08/11/2011    Procedure: CARDIOVERSION;  Surgeon: Lewayne Bunting, MD;  Location: [facility] OR;  Service: Cardiovascular;  Laterality: N/A;    Family History  Problem Relation Age of Onset  . Asthma    . Breast cancer    . Lymphoma    . COPD      Social History History  Substance Use Topics  . Smoking status: Never Smoker   . Smokeless tobacco: Not on file  . Alcohol Use: No    No Known Allergies  Current  Outpatient Prescriptions  Medication Sig Dispense Refill  . acetaminophen (TYLENOL) 325 MG tablet Take 650 mg by mouth every 6 (six) hours as needed. For pain      . aspirin 81 MG tablet Take 81 mg by mouth daily.      . DULoxetine (CYMBALTA) 30 MG capsule Take 30 mg by mouth daily.        Marland Kitchen lisinopril (PRINIVIL,ZESTRIL) 40 MG tablet Take 1 tablet (40 mg total) by mouth daily.  30 tablet  12  . montelukast (SINGULAIR) 10 MG tablet Take 10 mg by mouth at bedtime.        . TAZTIA XT 360 MG 24 hr capsule TAKE 1 CAPSULE BY MOUTH EVERY MORNING  30 capsule  0  . warfarin (COUMADIN) 5 MG tablet Take 1 tablet (5 mg total) by mouth daily.  30 tablet  6  . HYDROcodone-acetaminophen (NORCO) 5-325 MG per tablet Take 1 tablet by mouth every 6 (six) hours as needed for pain.  45 tablet  0    Review of Systems Review of Systems All other review of systems negative or noncontributory except as stated in the HPI  Blood pressure 128/86, pulse 71, temperature 98.4 F (36.9 C), temperature source Temporal, resp. rate 16, height 6\' 4"  (1.93 m), weight 272 lb 12.8 oz (123.741 kg).  Physical Exam Physical Exam Physical Exam  Vitals  reviewed. Constitutional: He is oriented to person, place, and time. He appears well-developed and well-nourished. No distress.  HENT:  Head: Normocephalic and atraumatic.  Mouth/Throat: No oropharyngeal exudate.  Eyes: Conjunctivae and EOM are normal. Pupils are equal, round, and reactive to light. Right eye exhibits no discharge. Left eye exhibits no discharge. No scleral icterus.  Neck: Normal range of motion. No tracheal deviation present.  Cardiovascular: Normal rate, regular rhythm and normal heart sounds.   Pulmonary/Chest: Effort normal and breath sounds normal. No stridor. No respiratory distress. He has no wheezes. He has no rales. He exhibits no tenderness.  Abdominal: Soft. Bowel sounds are normal. He exhibits no distension and no mass. There is no tenderness. There is  no rebound and no guarding. he has a small reducible umbilical hernia. He also has a reducible right inguinal hernia on exam and no obvious left inguinal hernia on exam Musculoskeletal: Normal range of motion. He exhibits no edema and no tenderness.  Neurological: He is alert and oriented to person, place, and time.  Skin: Skin is warm and dry. No rash noted. He is not diaphoretic. No erythema. No pallor.  Psychiatric: He has a normal mood and affect. His behavior is normal. Judgment and thought content normal.    Data Reviewed Outside records and CT scan  Assessment    Reducible right inguinal hernia and umbilical hernia He has a fairly symptomatic right inguinal hernia as well as a small umbilical hernia. We discussed the options for open or laparoscopic repair and we will plan for laparoscopic repair of his right inguinal hernia as well as repair of his umbilical hernia as well. There is some question of a CT scan of the possible left angle hernia as well and I think that I would plan for a transabdominal approach. If he has a left inguinal hernia and we could address this as well. I discussed with him the pros and cons of the procedure and the risks and benefits including infection, bleeding, pain, scarring, persistent pain and chronic pain, injury to the testicle or vas deferens and recurrence and he expressed understanding and desires to proceed with planned procedure. We also discussed the possibility of the need for open surgery and he was in agreement of this as well.    Plan    We'll plan for laparoscopic transabdominal inguinal hernia repair with mesh and umbilical hernia repair as soon as convenient and as soon as we can receive clearance from his cardiologist and instructions with regard to stopping his Coumadin. I will have a low threshold for open repair of his hernia especially given his Coumadin use       Fransico Sciandra DAVID 08/03/2012, 4:47 PM

## 2012-08-26 NOTE — Transfer of Care (Signed)
Immediate Anesthesia Transfer of Care Note  Patient: [name]  Procedure(s) Performed: Procedure(s) with comments: LAPAROSCOPIC INGUINAL HERNIA (Right) - laparoscopic right inguinal hernia repair with mesh, umbilical hernia repair INSERTION OF MESH (Right) - right inguinal hernia HERNIA REPAIR UMBILICAL ADULT (N/A)  Patient Location: PACU  Anesthesia Type:General  Level of Consciousness: awake, alert  and oriented  Airway & Oxygen Therapy: Patient Spontanous Breathing and Patient connected to face mask oxygen  Post-op Assessment: Report given to PACU RN, Post -op Vital signs reviewed and stable and Patient moving all extremities X 4  Post vital signs: Reviewed and stable  Complications: No apparent anesthesia complications

## 2012-08-26 NOTE — Anesthesia Postprocedure Evaluation (Signed)
Anesthesia Post Note  Patient: [name]  Procedure(s) Performed: Procedure(s) (LRB): LAPAROSCOPIC INGUINAL HERNIA (Right) INSERTION OF MESH (Right) HERNIA REPAIR UMBILICAL ADULT (N/A)  Anesthesia type: general  Patient location: PACU  Post pain: Pain level controlled  Post assessment: Patient's Cardiovascular Status Stable  Last Vitals:  Filed Vitals:   08/26/12 1545  BP: 133/86  Pulse: 87  Temp: 36.2 C  Resp: 10    Post vital signs: Reviewed and stable  Level of consciousness: sedated  Complications: No apparent anesthesia complications

## 2012-08-27 ENCOUNTER — Encounter (HOSPITAL_COMMUNITY): Payer: Self-pay | Admitting: General Surgery

## 2012-08-27 ENCOUNTER — Telehealth (INDEPENDENT_AMBULATORY_CARE_PROVIDER_SITE_OTHER): Payer: Self-pay | Admitting: *Deleted

## 2012-08-27 NOTE — Telephone Encounter (Signed)
Wife called to state that patient is unable to take Oxycodone due to it giving him a "terrible HA".  Hydrocodone 5/325mg  1-2 tab every 4-6 hours as need for pain #30 no refills called into [facility] @ [phone].  Wife updated and agreeable [phone] (wife cell).

## 2012-08-27 NOTE — Op Note (Signed)
NAME:  [name], [name] NO.:  [id number]  MEDICAL RECORD NO.:  [id number]  LOCATION:  [facility]                         FACILITY:  [facility]  PHYSICIAN:  Lodema Pilot, MD       DATE OF BIRTH:  04/30/1956  DATE OF PROCEDURE:  08/26/2012 DATE OF DISCHARGE:  08/26/2012                              OPERATIVE REPORT   PROCEDURE:  Laparoscopic repair of right inguinal hernia and open umbilical hernia repair.  PREOPERATIVE DIAGNOSIS:  Right inguinal hernia and umbilical hernia.  POSTOPERATIVE DIAGNOSIS:  Right inguinal hernia and umbilical hernia.  SURGEON:  Lodema Pilot, MD  ASSISTANT:  None.  ANESTHESIA:  General endotracheal anesthesia with local anesthesia.  FLUIDS:  2 L crystalloid.  ESTIMATED BLOOD LOSS:  Minimal.  DRAINS:  None.  SPECIMENS:  None.  COMPLICATIONS:  None apparent.  FINDINGS:  A large right inguinal hernia containing intestines and the appendix, repaired with 12 x 14 cm piece of UltraPro  mesh placed in the preperitoneal space.  The umbilical hernia repaired primarily.  INDICATIONS FOR PROCEDURE:  [name] is a 57 year old male with a symptomatic right inguinal hernia and an asymptomatic umbilical hernia. He desires operative repair of his hernia.  OPERATIVE DETAILS:  [name] was seen and evaluated in the preoperative area and risks and benefits of the procedure were again discussed in lay terms.  Informed consent was obtained.  Surgical sites were marked prior to anesthetic administration and he was taken to the operating room, placed on table in a supine position.  General endotracheal anesthesia was obtained.  The arms were tucked bilaterally. Foley catheter was placed and the abdomen was prepped and draped in a standard surgical fashion.  A supraumbilical midline incision was made in the skin and dissection was carried down through the abdominal wall fascia using blunt dissection.  The fascia was elevated and sharply incised and  the peritoneum was entered bluntly.  A 12-mm balloon port was placed at the umbilicus and pneumoperitoneum was obtained. Laparoscope was introduced and there was no evidence of bleeding or bowel injury upon entry.  He was placed in Trendelenburg position, and I inspected his right groin which had a moderate-to-large sized right inguinal hernia containing intestines and the appendix.  The left groin appeared to be without any evidence of inguinal hernia visually.  Then, I took down the peritoneum in the right lower quadrant using sharp dissection and Bovie electrocautery and dissected the preperitoneal space.  I dissected just medially to the pubic tubercle and Cooper's ligament was cleared laterally towards the epigastric vessels.  I also cleared the preperitoneal space laterally and the cord structures were identified.  He had a fairly large cord lipoma laterally, which was reduced, but he also had a decent sized hernia sac __________.  I reduced the hernia sac and separated this from the cord structures.  I confirmed that I had the sac completely reduced visually by elevating the peritoneum and verifying that I had completely reduced the sac. Posterior to the angle of the vas deferens.  I inspected the cord structures to ensure that he did not have any other hernia and that the sac was completely reduced and after I felt  that I reduced the hernia sac completely, I inspected the area for hemostasis, which was noted to be adequate.  I then cut a 6 x 6-inch piece of UltraPro mesh to about 12 cm x 14 cm and placed this into the preperitoneal space.  It was tacked medially to Cooper's ligament and medially to the anterior abdominal wall.  It was stretched laterally and tacked anteriorly to the abdominal wall making sure that none of the tacks were placed posteriorly to the anterosuperior iliac spine.  I have stretched the mesh out to cover the cord structures and the hernia defect and Evicel  fibrin glue was used to secure the mesh posteriorly.  The fatty contents in the hernia sac, I verified that the hernia contents and cord lipoma were reduced and on top of the mesh and the peritoneum was tacked up to the abdominal wall to cover the mesh.  I reduced the hernia sac and originally planned on tacking this to the abdominal wall to ensure that this would keep the sac reduced while the mesh scarred in place, however, I thought that this might be a space for an internal hernia, and I did not actually do this.  Also, the appendix was attached to the tip of the hernia sac.  I inspected the bowel contents to ensure that there was no injury at the intestinal contents through the dissections and everything appeared to be normal. The abdomen appeared to be hemostatic.  I then removed the 12-mm umbilical trocar and connected my fascial incision with the umbilical hernia defect to make a single defect.  I reduced the preperitoneal fat which was in the hernia sac, and amputated this and removed this piece of sac.  I then approximated the fascia with several interrupted 0 PDS sutures in open fashion.  The sutures were secured and the base of the umbilicus was tacked to the abdominal wall and the underlying fascia using 3-0 Vicryl suture.  The skin edges and wound was injected with 1% lidocaine with epinephrine and 0.25% Marcaine in 50:50 mixture.  The skin edges were approximated with 4-0 Monocryl and subcuticular suture at the umbilicus.  I reinsufflated the abdomen through the lateral 5-mm trocars and again noted that the abdomen was hemostatic and that the umbilical fascia closure was adequate without any evidence of bleeding or bowel injury.  The cath removed and the final trocars were removed and the skin edges were approximated with 4-0 Monocryl subcuticular suture at all skin incisions.  Skin was then washed and dried, and Dermabond was applied, and a sterile suction dressing was  applied at the umbilicus.  The Foley catheter was removed at the end of the case and I held pressure on the right inguinal region While the patient was extubated.  The patient was hemodynamically stable and ready for transfer to the recovery room in stable condition.          ______________________________ Lodema Pilot, MD     BL/MEDQ  D:  08/26/2012  T:  08/27/2012  Job:  [id number]

## 2012-09-11 ENCOUNTER — Other Ambulatory Visit: Payer: Self-pay | Admitting: Cardiology

## 2012-09-14 ENCOUNTER — Encounter (INDEPENDENT_AMBULATORY_CARE_PROVIDER_SITE_OTHER): Payer: Self-pay | Admitting: General Surgery

## 2012-09-14 ENCOUNTER — Ambulatory Visit (INDEPENDENT_AMBULATORY_CARE_PROVIDER_SITE_OTHER): Payer: BC Managed Care – PPO | Admitting: General Surgery

## 2012-09-14 VITALS — BP 130/84 | HR 91 | Temp 97.4°F | Resp 18 | Ht 77.0 in | Wt 270.8 lb

## 2012-09-14 DIAGNOSIS — Z5189 Encounter for other specified aftercare: Secondary | ICD-10-CM

## 2012-09-14 NOTE — Progress Notes (Signed)
Subjective:     Patient ID: [name], male   DOB: 1955-12-10, 57 y.o.   MRN: [id number]  HPI This patient follows up 2 weeks status post laparoscopicright inguinal and umbilical hernia repair. He's doing very well from this procedure and is off pain medication. He says his bowels are functioning normally and he is tolerating regular diet. He says that the pain down his leg that he had preoperatively is actually much improved since his procedure. He is asking when he can get back to lifting and exercise  Review of Systems     Objective:   Physical Exam No distress nontoxic appearing His incisions are healing well without sign of infection. There is no evidence of any bulge with Valsalva at both the umbilicus or in the right groin.    Assessment:     Status post laparoscopic right inguinal hernia repair with mesh and umbilical hernia repair-doing well He is recovering nicely from his procedure. There is no evidence of any postoperative complication. I recommended that he continue with another 2 weeks of likely that he can gradually increase his activity as tolerated. He can followup with me on a when necessary basis.     Plan:     Another 2 weeks of light duty and then followup when necessary

## 2012-09-16 ENCOUNTER — Encounter (INDEPENDENT_AMBULATORY_CARE_PROVIDER_SITE_OTHER): Payer: BC Managed Care – PPO | Admitting: General Surgery

## 2012-12-27 ENCOUNTER — Other Ambulatory Visit: Payer: Self-pay | Admitting: Cardiology

## 2012-12-29 ENCOUNTER — Ambulatory Visit (INDEPENDENT_AMBULATORY_CARE_PROVIDER_SITE_OTHER): Payer: BC Managed Care – PPO | Admitting: *Deleted

## 2012-12-29 DIAGNOSIS — I4891 Unspecified atrial fibrillation: Secondary | ICD-10-CM

## 2012-12-29 LAB — POCT INR: INR: 1.7

## 2012-12-29 MED ORDER — WARFARIN SODIUM 5 MG PO TABS: ORAL_TABLET | ORAL | Status: DC

## 2013-01-13 ENCOUNTER — Emergency Department (HOSPITAL_COMMUNITY): Payer: BC Managed Care – PPO

## 2013-01-13 ENCOUNTER — Encounter (HOSPITAL_COMMUNITY): Payer: Self-pay | Admitting: Emergency Medicine

## 2013-01-13 ENCOUNTER — Observation Stay (HOSPITAL_COMMUNITY)
Admission: EM | Admit: 2013-01-13 | Discharge: 2013-01-14 | Disposition: A | Discharge status: Home / Self Care | Payer: BC Managed Care – PPO | Attending: General Surgery | Admitting: General Surgery

## 2013-01-13 DIAGNOSIS — S2242XA Multiple fractures of ribs, left side, initial encounter for closed fracture: Secondary | ICD-10-CM

## 2013-01-13 DIAGNOSIS — S060X1A Concussion with loss of consciousness of 30 minutes or less, initial encounter: Principal | ICD-10-CM | POA: Insufficient documentation

## 2013-01-13 DIAGNOSIS — S060X9A Concussion with loss of consciousness of unspecified duration, initial encounter: Secondary | ICD-10-CM

## 2013-01-13 DIAGNOSIS — I4891 Unspecified atrial fibrillation: Secondary | ICD-10-CM | POA: Insufficient documentation

## 2013-01-13 DIAGNOSIS — I10 Essential (primary) hypertension: Secondary | ICD-10-CM | POA: Insufficient documentation

## 2013-01-13 DIAGNOSIS — Y93H2 Activity, gardening and landscaping: Secondary | ICD-10-CM | POA: Insufficient documentation

## 2013-01-13 DIAGNOSIS — W19XXXA Unspecified fall, initial encounter: Secondary | ICD-10-CM

## 2013-01-13 DIAGNOSIS — S069X0A Unspecified intracranial injury without loss of consciousness, initial encounter: Secondary | ICD-10-CM

## 2013-01-13 DIAGNOSIS — S060XAA Concussion with loss of consciousness status unknown, initial encounter: Secondary | ICD-10-CM

## 2013-01-13 DIAGNOSIS — R072 Precordial pain: Secondary | ICD-10-CM | POA: Insufficient documentation

## 2013-01-13 DIAGNOSIS — Z7901 Long term (current) use of anticoagulants: Secondary | ICD-10-CM | POA: Insufficient documentation

## 2013-01-13 DIAGNOSIS — S2249XA Multiple fractures of ribs, unspecified side, initial encounter for closed fracture: Secondary | ICD-10-CM | POA: Insufficient documentation

## 2013-01-13 DIAGNOSIS — S272XXA Traumatic hemopneumothorax, initial encounter: Secondary | ICD-10-CM | POA: Insufficient documentation

## 2013-01-13 DIAGNOSIS — W1789XA Other fall from one level to another, initial encounter: Secondary | ICD-10-CM | POA: Insufficient documentation

## 2013-01-13 DIAGNOSIS — R55 Syncope and collapse: Secondary | ICD-10-CM | POA: Insufficient documentation

## 2013-01-13 DIAGNOSIS — D68318 Other hemorrhagic disorder due to intrinsic circulating anticoagulants, antibodies, or inhibitors: Secondary | ICD-10-CM

## 2013-01-13 DIAGNOSIS — S270XXA Traumatic pneumothorax, initial encounter: Secondary | ICD-10-CM

## 2013-01-13 DIAGNOSIS — S2232XA Fracture of one rib, left side, initial encounter for closed fracture: Secondary | ICD-10-CM

## 2013-01-13 LAB — GLUCOSE, CAPILLARY: Glucose-Capillary: 102 mg/dL — ABNORMAL HIGH (ref 70–99)

## 2013-01-13 LAB — URINALYSIS, ROUTINE W REFLEX MICROSCOPIC
Bilirubin Urine: NEGATIVE
Nitrite: NEGATIVE
Protein, ur: NEGATIVE mg/dL
Specific Gravity, Urine: 1.018 (ref 1.005–1.030)
Urobilinogen, UA: 0.2 mg/dL (ref 0.0–1.0)

## 2013-01-13 LAB — POCT I-STAT, CHEM 8
BUN: 19 mg/dL (ref 6–23)
Calcium, Ion: 1.15 mmol/L (ref 1.12–1.23)
Chloride: 102 mEq/L (ref 96–112)
Glucose, Bld: 116 mg/dL — ABNORMAL HIGH (ref 70–99)

## 2013-01-13 LAB — CBC WITH DIFFERENTIAL/PLATELET
Basophils Absolute: 0 10*3/uL (ref 0.0–0.1)
Basophils Relative: 0 % (ref 0–1)
MCHC: 34.5 g/dL (ref 30.0–36.0)
Neutro Abs: 4.7 10*3/uL (ref 1.7–7.7)
Neutrophils Relative %: 52 % (ref 43–77)
Platelets: 276 10*3/uL (ref 150–400)
RDW: 13.1 % (ref 11.5–15.5)

## 2013-01-13 LAB — RAPID URINE DRUG SCREEN, HOSP PERFORMED
Opiates: NOT DETECTED
Tetrahydrocannabinol: NOT DETECTED

## 2013-01-13 LAB — BASIC METABOLIC PANEL
Chloride: 101 mEq/L (ref 96–112)
GFR calc Af Amer: 89 mL/min — ABNORMAL LOW (ref >90)
Potassium: 3.9 mEq/L (ref 3.5–5.1)

## 2013-01-13 LAB — TROPONIN I: Troponin I: <0.3 ng/mL (ref <0.30)

## 2013-01-13 LAB — URINE MICROSCOPIC-ADD ON

## 2013-01-13 MED ORDER — ACETAMINOPHEN 325 MG PO TABS
650.0000 mg | ORAL_TABLET | ORAL | Status: DC | PRN
  Administered 2013-01-13 – 2013-01-14 (×4): 650 mg via ORAL
  Filled 2013-01-13 (×4): qty 2

## 2013-01-13 MED ORDER — HYDROMORPHONE HCL PF 1 MG/ML IJ SOLN
1.0000 mg | Freq: Once | INTRAMUSCULAR | Status: AC
  Administered 2013-01-13: 1 mg via INTRAVENOUS
  Filled 2013-01-13: qty 1

## 2013-01-13 MED ORDER — ONDANSETRON HCL 4 MG/2ML IJ SOLN
4.0000 mg | Freq: Four times a day (QID) | INTRAMUSCULAR | Status: DC | PRN
  Administered 2013-01-13: 4 mg via INTRAVENOUS
  Filled 2013-01-13: qty 2

## 2013-01-13 MED ORDER — ONDANSETRON HCL 4 MG PO TABS: 4.0000 mg | ORAL_TABLET | Freq: Four times a day (QID) | ORAL | Status: DC | PRN

## 2013-01-13 MED ORDER — DILTIAZEM HCL ER BEADS 120 MG PO CP24
360.0000 mg | ORAL_CAPSULE | Freq: Every morning | ORAL | Status: DC
  Administered 2013-01-14: 360 mg via ORAL
  Filled 2013-01-13: qty 1

## 2013-01-13 MED ORDER — NITROGLYCERIN 0.4 MG SL SUBL
0.4000 mg | SUBLINGUAL_TABLET | SUBLINGUAL | Status: AC | PRN
  Administered 2013-01-13 (×3): 0.4 mg via SUBLINGUAL

## 2013-01-13 MED ORDER — IOHEXOL 300 MG/ML  SOLN
100.0000 mL | Freq: Once | INTRAMUSCULAR | Status: AC | PRN
  Administered 2013-01-13: 100 mL via INTRAVENOUS

## 2013-01-13 MED ORDER — SODIUM CHLORIDE 0.9 % IV SOLN: 250.0000 mL | INTRAVENOUS | Status: DC | PRN

## 2013-01-13 MED ORDER — LISINOPRIL 40 MG PO TABS
40.0000 mg | ORAL_TABLET | Freq: Every day | ORAL | Status: DC
  Administered 2013-01-13 – 2013-01-14 (×2): 40 mg via ORAL
  Filled 2013-01-13 (×2): qty 1

## 2013-01-13 MED ORDER — DOCUSATE SODIUM 100 MG PO CAPS
100.0000 mg | ORAL_CAPSULE | Freq: Two times a day (BID) | ORAL | Status: DC
  Administered 2013-01-13 – 2013-01-14 (×2): 100 mg via ORAL
  Filled 2013-01-13 (×4): qty 1

## 2013-01-13 MED ORDER — SODIUM CHLORIDE 0.9 % IV SOLN
INTRAVENOUS | Status: DC
  Administered 2013-01-13: 10:00:00 via INTRAVENOUS

## 2013-01-13 MED ORDER — OXYCODONE HCL 5 MG PO TABS
5.0000 mg | ORAL_TABLET | ORAL | Status: DC | PRN
  Administered 2013-01-13: 10 mg via ORAL
  Administered 2013-01-13: 15 mg via ORAL
  Administered 2013-01-13 – 2013-01-14 (×4): 10 mg via ORAL
  Filled 2013-01-13: qty 3
  Filled 2013-01-13 (×6): qty 2

## 2013-01-13 MED ORDER — FENTANYL CITRATE 0.05 MG/ML IJ SOLN
50.0000 ug | Freq: Once | INTRAMUSCULAR | Status: AC
  Administered 2013-01-13: 50 ug via INTRAVENOUS
  Filled 2013-01-13: qty 2

## 2013-01-13 MED ORDER — SODIUM CHLORIDE 0.9 % IJ SOLN: 3.0000 mL | INTRAMUSCULAR | Status: DC | PRN

## 2013-01-13 MED ORDER — SALINE SPRAY 0.65 % NA SOLN
1.0000 | NASAL | Status: DC | PRN
  Filled 2013-01-13: qty 44

## 2013-01-13 MED ORDER — ENOXAPARIN SODIUM 30 MG/0.3ML ~~LOC~~ SOLN
30.0000 mg | Freq: Two times a day (BID) | SUBCUTANEOUS | Status: DC
  Administered 2013-01-13 – 2013-01-14 (×2): 30 mg via SUBCUTANEOUS
  Filled 2013-01-13 (×4): qty 0.3

## 2013-01-13 MED ORDER — NITROGLYCERIN 2 % TD OINT
1.0000 [in_us] | TOPICAL_OINTMENT | Freq: Four times a day (QID) | TRANSDERMAL | Status: DC
  Administered 2013-01-13: 1 [in_us] via TOPICAL
  Filled 2013-01-13: qty 1

## 2013-01-13 MED ORDER — NAPROXEN 500 MG PO TABS
500.0000 mg | ORAL_TABLET | Freq: Two times a day (BID) | ORAL | Status: DC
  Filled 2013-01-13 (×4): qty 1

## 2013-01-13 MED ORDER — DULOXETINE HCL 30 MG PO CPEP
30.0000 mg | ORAL_CAPSULE | Freq: Every day | ORAL | Status: DC
  Administered 2013-01-13 – 2013-01-14 (×2): 30 mg via ORAL
  Filled 2013-01-13 (×2): qty 1

## 2013-01-13 MED ORDER — SODIUM CHLORIDE 0.9 % IJ SOLN
3.0000 mL | Freq: Two times a day (BID) | INTRAMUSCULAR | Status: DC
  Administered 2013-01-13 – 2013-01-14 (×2): 3 mL via INTRAVENOUS

## 2013-01-13 MED ORDER — NITROGLYCERIN 0.4 MG SL SUBL: 0.4000 mg | SUBLINGUAL_TABLET | SUBLINGUAL | Status: DC | PRN

## 2013-01-13 MED ORDER — MORPHINE SULFATE 2 MG/ML IJ SOLN
2.0000 mg | INTRAMUSCULAR | Status: DC | PRN
  Administered 2013-01-13 – 2013-01-14 (×3): 2 mg via INTRAVENOUS
  Filled 2013-01-13 (×3): qty 1

## 2013-01-13 NOTE — ED Notes (Signed)
MD at bedside. 

## 2013-01-13 NOTE — ED Provider Notes (Signed)
History    CSN: [id number] Arrival date & time 01/13/13  0907  First MD Initiated Contact with Patient 01/13/13 [id number]     Chief Complaint  Patient presents with  . Chest Pain  . Loss of Consciousness   (Consider location/radiation/quality/duration/timing/severity/associated sxs/prior Treatment) Patient is a 57 y.o. male presenting with chest pain and syncope.  Chest Pain Associated symptoms: syncope   Loss of Consciousness Associated symptoms: chest pain    Pt with multiple medical problems was in his normal state of health this morning although not seen by wife before he left for work. 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 Wife reports she spoke to him on the phone around 7:30am and he was normal. He called back about 9am and was very confused, did not know where he was or what is name was. She advised him to call EMS who found him sitting in his truck, confused and complaining of mid sternal chest pain. Code Stroke was activated in the field although there was no reported neuro deficits and his confusion improved en route. He has no history of seizures. Now complaining of moderate midsternal chest pressure, no SOB. He is very anxious that he is having a heart attack or stroke. He is on coumadin for PAF s/p ablation but no known CAD.   Past Medical History  Diagnosis Date  . HLD (hyperlipidemia)   . Depression   . Allergic rhinitis   . NICM (nonischemic cardiomyopathy)   . Narcotic addiction   . Inguinal hernia without mention of obstruction or gangrene, unilateral or unspecified, (not specified as recurrent)   . Hypertrophy of prostate with urinary obstruction and other lower urinary tract symptoms (LUTS)   . Acquired cyst of kidney   . Complication of anesthesia     "hard to put asleep, hard to wake up, nausea and vomiting"  . PONV (postoperative nausea and vomiting)   . PAF (paroxysmal atrial  fibrillation)     ablation done 03/2006, sees Dr. Flossie Buffy, [facility]  . HTN (hypertension)     sees Dr. Elias Else, [facility]  . Asthma     as a child  . Pneumonia     hx of 2004  . Lumbar herniated disc     L7   Past Surgical History  Procedure Laterality Date  . Atrial ablation surgery  2007    [facility]  . Cardioversion  08/11/2011    Procedure: CARDIOVERSION;  Surgeon: Lewayne Bunting, MD;  Location: [facility];  Service: Cardiovascular;  Laterality: N/A;  . Hernia repair      1977  . Eye surgery      lasik, 1998  . Inguinal hernia repair Right 08/26/2012    Procedure: LAPAROSCOPIC INGUINAL HERNIA;  Surgeon: Lodema Pilot, DO;  Location: [facility] OR;  Service: General;  Laterality: Right;  laparoscopic right inguinal hernia repair with mesh, umbilical hernia repair  . Insertion of mesh Right 08/26/2012    Procedure: INSERTION OF MESH;  Surgeon: Lodema Pilot, DO;  Location: [facility] OR;  Service: General;  Laterality: Right;  right inguinal hernia  . Umbilical hernia repair N/A 08/26/2012    Procedure: HERNIA REPAIR UMBILICAL ADULT;  Surgeon: Lodema Pilot, DO;  Location: [facility] OR;  Service: General;  Laterality: N/A;   Family History  Problem Relation Age of Onset  . Asthma    . Breast cancer    . Lymphoma    .  COPD     History  Substance Use Topics  . Smoking status: Never Smoker   . Smokeless tobacco: Not on file  . Alcohol Use: No    Review of Systems  Cardiovascular: Positive for chest pain and syncope.   All other systems reviewed and are negative except as noted in HPI.     Allergies  Review of patient's allergies indicates no known allergies.  Home Medications   Current Outpatient Rx  Name  Route  Sig  Dispense  Refill  . acetaminophen (TYLENOL) 325 MG tablet   Oral   Take 650 mg by mouth every 6 (six) hours as needed. For pain         . diltiazem (TIAZAC) 360 MG 24 hr capsule   Oral   Take 360 mg by mouth daily.         . DULoxetine (CYMBALTA) 30 MG  capsule   Oral   Take 30 mg by mouth daily.           Marland Kitchen lisinopril (PRINIVIL,ZESTRIL) 40 MG tablet   Oral   Take 1 tablet (40 mg total) by mouth daily.   30 tablet   12   . montelukast (SINGULAIR) 10 MG tablet   Oral   Take 10 mg by mouth at bedtime as needed. Allergies.         . TAZTIA XT 360 MG 24 hr capsule      TAKE ONE CAPSULE BY MOUTH EVERY MORNING   30 capsule   0   . warfarin (COUMADIN) 5 MG tablet      Take as directed by coumadin clinic   35 tablet   1    BP 154/91  Pulse 77  Temp(Src) 97.6 F (36.4 C) (Oral)  Resp 23  SpO2 94% Physical Exam  Nursing note and vitals reviewed. Constitutional: He is oriented to person, place, and time. He appears well-developed and well-nourished.  HENT:  Head: Normocephalic and atraumatic.  Eyes: EOM are normal. Pupils are equal, round, and reactive to light.  Neck: Normal range of motion. Neck supple.  Cardiovascular: Normal rate, normal heart sounds and intact distal pulses.   Pulmonary/Chest: Effort normal and breath sounds normal.  Abdominal: Bowel sounds are normal. He exhibits no distension. There is no tenderness.  Musculoskeletal: Normal range of motion. He exhibits no edema and no tenderness.  Neurological: He is alert and oriented to person, place, and time. He has normal strength. No cranial nerve deficit or sensory deficit.  For full NIHSS, please see Stroke Team notes, normal neuro exam now  Skin: Skin is warm and dry. No rash noted.  Psychiatric:  anxious    ED Course  Procedures (including critical care time) Labs Reviewed  BASIC METABOLIC PANEL - Abnormal; Notable for the following:    Glucose, Bld 117 (*)    GFR calc non Af Amer 77 (*)    GFR calc Af Amer 89 (*)    All other components within normal limits  GLUCOSE, CAPILLARY - Abnormal; Notable for the following:    Glucose-Capillary 102 (*)    All other components within normal limits  POCT I-STAT, CHEM 8 - Abnormal; Notable for the  following:    Glucose, Bld 116 (*)    All other components within normal limits  CBC WITH DIFFERENTIAL  TROPONIN I  ETHANOL  URINALYSIS, ROUTINE W REFLEX MICROSCOPIC  URINE RAPID DRUG SCREEN (HOSP PERFORMED)   Dg Ribs Unilateral W/chest Left  01/13/2013   *RADIOLOGY  REPORT*  Clinical Data: Chest pain.  Loss of consciousness.  Fall.  LEFT RIBS AND CHEST - 3+ VIEW  Comparison: T 06/25/2013 chest x-ray.  Findings: Left third, fourth, fifth and sixth rib fractures with slightly displacement fracture fragments.  Subcutaneous emphysema left chest wall suggest there is communication with the lung however, no apical or lateral pneumothorax is demonstrated.  There is however, mild increased prominence of the pleural line when compared to the prior chest x-ray which may reflect a small amount of hemorrhage associated with the rib fractures.  Cardiomegaly.  Central pulmonary vascular prominence.  IMPRESSION: Left third, fourth, fifth and sixth rib fractures with slightly displacement fracture fragments.  Subcutaneous emphysema left chest wall suggest there is communication with the lung however, no apical or lateral pneumothorax is demonstrated.  There is however, mild increased prominence of the pleural line when compared to the prior chest x-ray which may reflect a small amount of hemorrhage associated with the rib fractures.  Critical Value/emergent results were called by telephone at the time of interpretation on 01/13/2013 at 12:49 p.m. to Dr. Renae Gloss , who verbally acknowledged these results.   Original Report Authenticated By: Lacy Duverney, M.D.   Ct Head Wo Contrast  01/13/2013   *RADIOLOGY REPORT*  Clinical Data: Syncopal episode.  Confusion.  CT HEAD WITHOUT CONTRAST  Technique:  Contiguous axial images were obtained from the base of the skull through the vertex without contrast.  Comparison: None.  Findings: No intracranial hemorrhage.  No CT evidence of large acute infarct.  No hydrocephalus.  No  intracranial mass lesion detected on this unenhanced exam.  Air-fluid level right maxillary sinus suggestive of result of acute sinusitis.  If there were trauma to the orbital region and orbital floor blowout fracture were of concern then CT of the orbits can be obtained for further delineation.  No findings of skull fracture.  IMPRESSION: No intracranial hemorrhage.  No CT evidence of large acute infarct.  Air-fluid level right maxillary sinus suggestive of result of acute sinusitis.  If there were trauma to the orbital region and orbital floor blowout fracture were of concern then CT of the orbits can be obtained for further delineation.  This is a code stroke discussed with Dr. Amada Jupiter 01/13/2013 9:32 a.m.   Original Report Authenticated By: Lacy Duverney, M.D.   Ct Chest W Contrast  01/13/2013   *RADIOLOGY REPORT*  Clinical Data:  Fall with known left-sided rib fractures.  CT CHEST, ABDOMEN AND PELVIS WITH CONTRAST  Technique:  Multidetector CT imaging of the chest, abdomen and pelvis was performed following the standard protocol during bolus administration of intravenous contrast.  Contrast: OMNIPAQUE IOHEXOL 300 MG/ML  SOLN  Comparison:  07/19/2012 abdominal CT.  CT CHEST  Findings:  THORACIC INLET/BODY WALL:  Left-sided subcutaneous emphysema related to rib fractures and air leak.  MEDIASTINUM:  Normal heart size.  No pericardial effusion.  No acute vascular abnormality.  No adenopathy.  LUNG WINDOWS:  Trace left pneumothorax.  Mild dependent atelectasis.  OSSEOUS:  There are acute fractures of the left lateral third, fourth, fifth, and sixth ribs.  At most there is 50% displacement.  The fourth and fifth ribs likely have segmental fractures posteriorly, nondisplaced.  Critical Value/emergent results were called by telephone at the time of interpretation on January 13, 2013.  at 02:10 p.m. to Dr. Bernette Mayers, who verbally acknowledged these results.  IMPRESSION: 1.  Trace left pneumothorax. 2.  Lateral  left third through sixth rib fractures, with segmental fractures  involving the fourth and fifth ribs posteriorly.  CT ABDOMEN AND PELVIS  Findings:  Liver: No focal abnormality.  Biliary: No evidence of biliary obstruction or stone.  Pancreas: Unremarkable.  Spleen: Unremarkable.  Adrenals: Unremarkable.  Kidneys and ureters: No evidence of injury.  1 cm cyst exophytic from the lower pole left kidney is stable.  A 6 cm cyst exophytic from the interpolar right kidney is likewise unchanged, including a small cortical cyst just anterior, 6 mm in diameter.  Bladder: Borderline thickening, likely related to an enlarged prostate.  Bowel: No obstruction. Normal appendix.  Retroperitoneum: No mass or adenopathy.  Peritoneum: No free fluid or gas.  Reproductive: Mild prostate enlargement with central calcifications.  Vascular: No acute abnormality.  OSSEOUS: No acute abnormalities. SI joint ankylosis bilaterally, likely degenerative given marginal spurring.  Notable right os acetabulum.  IMPRESSION: No acute intra-abdominal abnormality.   Original Report Authenticated By: Tiburcio Pea   Ct Abdomen Pelvis W Contrast  01/13/2013   *RADIOLOGY REPORT*  Clinical Data:  Fall with known left-sided rib fractures.  CT CHEST, ABDOMEN AND PELVIS WITH CONTRAST  Technique:  Multidetector CT imaging of the chest, abdomen and pelvis was performed following the standard protocol during bolus administration of intravenous contrast.  Contrast: OMNIPAQUE IOHEXOL 300 MG/ML  SOLN  Comparison:  07/19/2012 abdominal CT.  CT CHEST  Findings:  THORACIC INLET/BODY WALL:  Left-sided subcutaneous emphysema related to rib fractures and air leak.  MEDIASTINUM:  Normal heart size.  No pericardial effusion.  No acute vascular abnormality.  No adenopathy.  LUNG WINDOWS:  Trace left pneumothorax.  Mild dependent atelectasis.  OSSEOUS:  There are acute fractures of the left lateral third, fourth, fifth, and sixth ribs.  At most there is 50%  displacement.  The fourth and fifth ribs likely have segmental fractures posteriorly, nondisplaced.  Critical Value/emergent results were called by telephone at the time of interpretation on January 13, 2013.  at 02:10 p.m. to Dr. Bernette Mayers, who verbally acknowledged these results.  IMPRESSION: 1.  Trace left pneumothorax. 2.  Lateral left third through sixth rib fractures, with segmental fractures involving the fourth and fifth ribs posteriorly.  CT ABDOMEN AND PELVIS  Findings:  Liver: No focal abnormality.  Biliary: No evidence of biliary obstruction or stone.  Pancreas: Unremarkable.  Spleen: Unremarkable.  Adrenals: Unremarkable.  Kidneys and ureters: No evidence of injury.  1 cm cyst exophytic from the lower pole left kidney is stable.  A 6 cm cyst exophytic from the interpolar right kidney is likewise unchanged, including a small cortical cyst just anterior, 6 mm in diameter.  Bladder: Borderline thickening, likely related to an enlarged prostate.  Bowel: No obstruction. Normal appendix.  Retroperitoneum: No mass or adenopathy.  Peritoneum: No free fluid or gas.  Reproductive: Mild prostate enlargement with central calcifications.  Vascular: No acute abnormality.  OSSEOUS: No acute abnormalities. SI joint ankylosis bilaterally, likely degenerative given marginal spurring.  Notable right os acetabulum.  IMPRESSION: No acute intra-abdominal abnormality.   Original Report Authenticated By: Tiburcio Pea   1. Syncope   2. Head injury, acute, with loss of consciousness, initial encounter   3. Rib fractures, left, closed, initial encounter   4. Pneumothorax, closed, traumatic, initial encounter     MDM   Date: 01/13/2013  Rate: 75  Rhythm: normal sinus rhythm  QRS Axis: right  Intervals: normal  ST/T Wave abnormalities: nonspecific T wave changes  Conduction Disutrbances:right bundle branch block  Narrative Interpretation:   Old EKG Reviewed: unchanged  1:02 PM Pt now reporting severe L lateral  chest wall pain, tender to palpation. He has also developed a contusion to L parietal scalp. He has multiple rib fractures and subcutaneous air on CXR. Pt now remembers falling out of a pine needle truck and hitting his head. Doubt he had a true syncopal episode but instead had a concussion from head injury with rib fractures as the source of his CP. Will send for CT to eval underlying pulmonary or intra-abdominal injury.Pain medications ordered. Nitropaste discontinued. This does not appear to be ACS, STEMI or stroke.   2:30 PM CT chest as above with small PTX. Will admit to Trauma, Dr. Janee Morn consulted. Pain well controlled at this time.     Peta Peachey B. Bernette Mayers, MD 01/13/13 1432

## 2013-01-13 NOTE — H&P (Signed)
[name] is an 58 y.o. male.   Chief Complaint: Left rib pain HPI: Patient works as a Administrator. He was in the back of a tractor trailer moving sometime strong. When he went to get out of the truck, he fell striking his head and left chest. Questionable loss of consciousness at the scene. He is amnestic to a lot of the event. He denies any presyncope or dizziness prior to falling. He had mental status changes afterwards and was evaluated in the emergency department. There was initially concern that he had a stroke. His mental status improved and they realized that he fell out of the truck.We were called to see him for admission to the trauma service after he was found to have concussion and multiple left-sided rib fractures with small pneumothorax.  Past Medical History  Diagnosis Date  . HLD (hyperlipidemia)   . Depression   . Allergic rhinitis   . NICM (nonischemic cardiomyopathy)   . Narcotic addiction   . Inguinal hernia without mention of obstruction or gangrene, unilateral or unspecified, (not specified as recurrent)   . Hypertrophy of prostate with urinary obstruction and other lower urinary tract symptoms (LUTS)   . Acquired cyst of kidney   . Complication of anesthesia     "hard to put asleep, hard to wake up, nausea and vomiting"  . PONV (postoperative nausea and vomiting)   . PAF (paroxysmal atrial fibrillation)     ablation done 03/2006, sees Dr. Flossie Buffy, [facility]  . HTN (hypertension)     sees Dr. Elias Else, [facility]  . Asthma     as a child  . Pneumonia     hx of 2004  . Lumbar herniated disc     L7    Past Surgical History  Procedure Laterality Date  . Atrial ablation surgery  2007    [facility]  . Cardioversion  08/11/2011    Procedure: CARDIOVERSION;  Surgeon: Lewayne Bunting, MD;  Location: [facility] OR;  Service: Cardiovascular;  Laterality: N/A;  . Hernia repair      1977  . Eye surgery      lasik, 1998  . Inguinal hernia repair Right 08/26/2012     Procedure: LAPAROSCOPIC INGUINAL HERNIA;  Surgeon: Lodema Pilot, DO;  Location: [facility] OR;  Service: General;  Laterality: Right;  laparoscopic right inguinal hernia repair with mesh, umbilical hernia repair  . Insertion of mesh Right 08/26/2012    Procedure: INSERTION OF MESH;  Surgeon: Lodema Pilot, DO;  Location: [facility] OR;  Service: General;  Laterality: Right;  right inguinal hernia  . Umbilical hernia repair N/A 08/26/2012    Procedure: HERNIA REPAIR UMBILICAL ADULT;  Surgeon: Lodema Pilot, DO;  Location: [facility] OR;  Service: General;  Laterality: N/A;    Family History  Problem Relation Age of Onset  . Asthma    . Breast cancer    . Lymphoma    . COPD     Social History:  reports that he has never smoked. He does not have any smokeless tobacco history on file. He reports that he does not drink alcohol or use illicit drugs.  Allergies: No Known Allergies   (Not in a hospital admission)  Results for orders placed during the hospital encounter of 01/13/13 (from the past 48 hour(s))  CBC WITH DIFFERENTIAL     Status: None   Collection Time    01/13/13  9:20 AM      Result Value Range   WBC 8.9  4.0 - 10.5 K/uL   RBC 4.86  4.22 - 5.81 MIL/uL   Hemoglobin 14.6  13.0 - 17.0 g/dL   HCT 16.1  09.6 - 04.5 %   MCV 87.0  78.0 - 100.0 fL   MCH 30.0  26.0 - 34.0 pg   MCHC 34.5  30.0 - 36.0 g/dL   RDW 40.9  81.1 - 91.4 %   Platelets 276  150 - 400 K/uL   Neutrophils Relative % 52  43 - 77 %   Neutro Abs 4.7  1.7 - 7.7 K/uL   Lymphocytes Relative 36  12 - 46 %   Lymphs Abs 3.2  0.7 - 4.0 K/uL   Monocytes Relative 10  3 - 12 %   Monocytes Absolute 0.9  0.1 - 1.0 K/uL   Eosinophils Relative 2  0 - 5 %   Eosinophils Absolute 0.2  0.0 - 0.7 K/uL   Basophils Relative 0  0 - 1 %   Basophils Absolute 0.0  0.0 - 0.1 K/uL  BASIC METABOLIC PANEL     Status: Abnormal   Collection Time    01/13/13  9:20 AM      Result Value Range   Sodium 138  135 - 145 mEq/L   Potassium 3.9  3.5 - 5.1 mEq/L    Chloride 101  96 - 112 mEq/L   CO2 27  19 - 32 mEq/L   Glucose, Bld 117 (*) 70 - 99 mg/dL   BUN 19  6 - 23 mg/dL   Creatinine, Ser 7.82  0.50 - 1.35 mg/dL   Calcium 9.2  8.4 - 95.6 mg/dL   GFR calc non Af Amer 77 (*) >90 mL/min   GFR calc Af Amer 89 (*) >90 mL/min   Comment:            The eGFR has been calculated     using the CKD EPI equation.     This calculation has not been     validated in all clinical     situations.     eGFR's persistently     <90 mL/min signify     possible Chronic Kidney Disease.  TROPONIN I     Status: None   Collection Time    01/13/13  9:20 AM      Result Value Range   Troponin I <0.30  <0.30 ng/mL   Comment:            Due to the release kinetics of cTnI,     a negative result within the first hours     of the onset of symptoms does not rule out     myocardial infarction with certainty.     If myocardial infarction is still suspected,     repeat the test at appropriate intervals.  ETHANOL     Status: None   Collection Time    01/13/13  9:20 AM      Result Value Range   Alcohol, Ethyl (B) <11  0 - 11 mg/dL   Comment:            LOWEST DETECTABLE LIMIT FOR     SERUM ALCOHOL IS 11 mg/dL     FOR MEDICAL PURPOSES ONLY  POCT I-STAT, CHEM 8     Status: Abnormal   Collection Time    01/13/13  9:26 AM      Result Value Range   Sodium 140  135 - 145 mEq/L   Potassium 3.9  3.5 -  5.1 mEq/L   Chloride 102  96 - 112 mEq/L   BUN 19  6 - 23 mg/dL   Creatinine, Ser 8.65  0.50 - 1.35 mg/dL   Glucose, Bld 784 (*) 70 - 99 mg/dL   Calcium, Ion 6.96  2.95 - 1.23 mmol/L   TCO2 27  0 - 100 mmol/L   Hemoglobin 15.3  13.0 - 17.0 g/dL   HCT 28.4  13.2 - 44.0 %  GLUCOSE, CAPILLARY     Status: Abnormal   Collection Time    01/13/13  9:48 AM      Result Value Range   Glucose-Capillary 102 (*) 70 - 99 mg/dL  URINALYSIS, ROUTINE W REFLEX MICROSCOPIC     Status: Abnormal   Collection Time    01/13/13 12:46 PM      Result Value Range   Color, Urine YELLOW   YELLOW   APPearance CLEAR  CLEAR   Specific Gravity, Urine 1.018  1.005 - 1.030   pH 7.0  5.0 - 8.0   Glucose, UA NEGATIVE  NEGATIVE mg/dL   Hgb urine dipstick NEGATIVE  NEGATIVE   Bilirubin Urine NEGATIVE  NEGATIVE   Ketones, ur NEGATIVE  NEGATIVE mg/dL   Protein, ur NEGATIVE  NEGATIVE mg/dL   Urobilinogen, UA 0.2  0.0 - 1.0 mg/dL   Nitrite NEGATIVE  NEGATIVE   Leukocytes, UA MODERATE (*) NEGATIVE  URINE RAPID DRUG SCREEN (HOSP PERFORMED)     Status: None   Collection Time    01/13/13 12:46 PM      Result Value Range   Opiates NONE DETECTED  NONE DETECTED   Cocaine NONE DETECTED  NONE DETECTED   Benzodiazepines NONE DETECTED  NONE DETECTED   Amphetamines NONE DETECTED  NONE DETECTED   Tetrahydrocannabinol NONE DETECTED  NONE DETECTED   Barbiturates NONE DETECTED  NONE DETECTED   Comment:            DRUG SCREEN FOR MEDICAL PURPOSES     ONLY.  IF CONFIRMATION IS NEEDED     FOR ANY PURPOSE, NOTIFY LAB     WITHIN 5 DAYS.                LOWEST DETECTABLE LIMITS     FOR URINE DRUG SCREEN     Drug Class       Cutoff (ng/mL)     Amphetamine      1000     Barbiturate      200     Benzodiazepine   200     Tricyclics       300     Opiates          300     Cocaine          300     THC              50  URINE MICROSCOPIC-ADD ON     Status: None   Collection Time    01/13/13 12:46 PM      Result Value Range   Squamous Epithelial / LPF RARE  RARE   WBC, UA 7-10  <3 WBC/hpf   RBC / HPF 0-2  <3 RBC/hpf   Bacteria, UA RARE  RARE   Dg Ribs Unilateral W/chest Left  01/13/2013   *RADIOLOGY REPORT*  Clinical Data: Chest pain.  Loss of consciousness.  Fall.  LEFT RIBS AND CHEST - 3+ VIEW  Comparison: T 06/25/2013 chest x-ray.  Findings: Left third, fourth, fifth and sixth  rib fractures with slightly displacement fracture fragments.  Subcutaneous emphysema left chest wall suggest there is communication with the lung however, no apical or lateral pneumothorax is demonstrated.  There is  however, mild increased prominence of the pleural line when compared to the prior chest x-ray which may reflect a small amount of hemorrhage associated with the rib fractures.  Cardiomegaly.  Central pulmonary vascular prominence.  IMPRESSION: Left third, fourth, fifth and sixth rib fractures with slightly displacement fracture fragments.  Subcutaneous emphysema left chest wall suggest there is communication with the lung however, no apical or lateral pneumothorax is demonstrated.  There is however, mild increased prominence of the pleural line when compared to the prior chest x-ray which may reflect a small amount of hemorrhage associated with the rib fractures.  Critical Value/emergent results were called by telephone at the time of interpretation on 01/13/2013 at 12:49 p.m. to Dr. Renae Gloss , who verbally acknowledged these results.   Original Report Authenticated By: Lacy Duverney, M.D.   Ct Head Wo Contrast  01/13/2013   *RADIOLOGY REPORT*  Clinical Data: Syncopal episode.  Confusion.  CT HEAD WITHOUT CONTRAST  Technique:  Contiguous axial images were obtained from the base of the skull through the vertex without contrast.  Comparison: None.  Findings: No intracranial hemorrhage.  No CT evidence of large acute infarct.  No hydrocephalus.  No intracranial mass lesion detected on this unenhanced exam.  Air-fluid level right maxillary sinus suggestive of result of acute sinusitis.  If there were trauma to the orbital region and orbital floor blowout fracture were of concern then CT of the orbits can be obtained for further delineation.  No findings of skull fracture.  IMPRESSION: No intracranial hemorrhage.  No CT evidence of large acute infarct.  Air-fluid level right maxillary sinus suggestive of result of acute sinusitis.  If there were trauma to the orbital region and orbital floor blowout fracture were of concern then CT of the orbits can be obtained for further delineation.  This is a code stroke discussed  with Dr. Amada Jupiter 01/13/2013 9:32 a.m.   Original Report Authenticated By: Lacy Duverney, M.D.   Ct Chest W Contrast  01/13/2013   *RADIOLOGY REPORT*  Clinical Data:  Fall with known left-sided rib fractures.  CT CHEST, ABDOMEN AND PELVIS WITH CONTRAST  Technique:  Multidetector CT imaging of the chest, abdomen and pelvis was performed following the standard protocol during bolus administration of intravenous contrast.  Contrast: OMNIPAQUE IOHEXOL 300 MG/ML  SOLN  Comparison:  07/19/2012 abdominal CT.  CT CHEST  Findings:  THORACIC INLET/BODY WALL:  Left-sided subcutaneous emphysema related to rib fractures and air leak.  MEDIASTINUM:  Normal heart size.  No pericardial effusion.  No acute vascular abnormality.  No adenopathy.  LUNG WINDOWS:  Trace left pneumothorax.  Mild dependent atelectasis.  OSSEOUS:  There are acute fractures of the left lateral third, fourth, fifth, and sixth ribs.  At most there is 50% displacement.  The fourth and fifth ribs likely have segmental fractures posteriorly, nondisplaced.  Critical Value/emergent results were called by telephone at the time of interpretation on January 13, 2013.  at 02:10 p.m. to Dr. Bernette Mayers, who verbally acknowledged these results.  IMPRESSION: 1.  Trace left pneumothorax. 2.  Lateral left third through sixth rib fractures, with segmental fractures involving the fourth and fifth ribs posteriorly.  CT ABDOMEN AND PELVIS  Findings:  Liver: No focal abnormality.  Biliary: No evidence of biliary obstruction or stone.  Pancreas: Unremarkable.  Spleen: Unremarkable.  Adrenals: Unremarkable.  Kidneys and ureters: No evidence of injury.  1 cm cyst exophytic from the lower pole left kidney is stable.  A 6 cm cyst exophytic from the interpolar right kidney is likewise unchanged, including a small cortical cyst just anterior, 6 mm in diameter.  Bladder: Borderline thickening, likely related to an enlarged prostate.  Bowel: No obstruction. Normal appendix.   Retroperitoneum: No mass or adenopathy.  Peritoneum: No free fluid or gas.  Reproductive: Mild prostate enlargement with central calcifications.  Vascular: No acute abnormality.  OSSEOUS: No acute abnormalities. SI joint ankylosis bilaterally, likely degenerative given marginal spurring.  Notable right os acetabulum.  IMPRESSION: No acute intra-abdominal abnormality.   Original Report Authenticated By: Tiburcio Pea   Ct Abdomen Pelvis W Contrast  01/13/2013   *RADIOLOGY REPORT*  Clinical Data:  Fall with known left-sided rib fractures.  CT CHEST, ABDOMEN AND PELVIS WITH CONTRAST  Technique:  Multidetector CT imaging of the chest, abdomen and pelvis was performed following the standard protocol during bolus administration of intravenous contrast.  Contrast: OMNIPAQUE IOHEXOL 300 MG/ML  SOLN  Comparison:  07/19/2012 abdominal CT.  CT CHEST  Findings:  THORACIC INLET/BODY WALL:  Left-sided subcutaneous emphysema related to rib fractures and air leak.  MEDIASTINUM:  Normal heart size.  No pericardial effusion.  No acute vascular abnormality.  No adenopathy.  LUNG WINDOWS:  Trace left pneumothorax.  Mild dependent atelectasis.  OSSEOUS:  There are acute fractures of the left lateral third, fourth, fifth, and sixth ribs.  At most there is 50% displacement.  The fourth and fifth ribs likely have segmental fractures posteriorly, nondisplaced.  Critical Value/emergent results were called by telephone at the time of interpretation on January 13, 2013.  at 02:10 p.m. to Dr. Bernette Mayers, who verbally acknowledged these results.  IMPRESSION: 1.  Trace left pneumothorax. 2.  Lateral left third through sixth rib fractures, with segmental fractures involving the fourth and fifth ribs posteriorly.  CT ABDOMEN AND PELVIS  Findings:  Liver: No focal abnormality.  Biliary: No evidence of biliary obstruction or stone.  Pancreas: Unremarkable.  Spleen: Unremarkable.  Adrenals: Unremarkable.  Kidneys and ureters: No evidence of  injury.  1 cm cyst exophytic from the lower pole left kidney is stable.  A 6 cm cyst exophytic from the interpolar right kidney is likewise unchanged, including a small cortical cyst just anterior, 6 mm in diameter.  Bladder: Borderline thickening, likely related to an enlarged prostate.  Bowel: No obstruction. Normal appendix.  Retroperitoneum: No mass or adenopathy.  Peritoneum: No free fluid or gas.  Reproductive: Mild prostate enlargement with central calcifications.  Vascular: No acute abnormality.  OSSEOUS: No acute abnormalities. SI joint ankylosis bilaterally, likely degenerative given marginal spurring.  Notable right os acetabulum.  IMPRESSION: No acute intra-abdominal abnormality.   Original Report Authenticated By: Tiburcio Pea    Review of Systems  Constitutional: Negative for weight loss.  HENT: Negative for hearing loss, ear pain, neck pain, tinnitus and ear discharge.   Eyes: Negative for blurred vision, double vision, photophobia and pain.  Respiratory: Negative for cough, sputum production and shortness of breath.   Cardiovascular: Positive for chest pain.  Gastrointestinal: Negative for nausea, vomiting and abdominal pain.  Genitourinary: Negative for dysuria, urgency, frequency and flank pain.  Musculoskeletal: Negative for myalgias, back pain, joint pain and falls.  Neurological: Positive for dizziness and loss of consciousness. Negative for tingling, sensory change, focal weakness and headaches.       Dizziness after the fall,  questionable loss of consciousness  Endo/Heme/Allergies: Does not bruise/bleed easily.  Psychiatric/Behavioral: Negative for depression, memory loss and substance abuse. The patient is not nervous/anxious.     Blood pressure 133/93, pulse 93, temperature 97.6 F (36.4 C), temperature source Oral, resp. rate 19, height 6\' 4"  (1.93 m), weight 123.152 kg (271 lb 8 oz), SpO2 93.00%. Physical Exam  Vitals reviewed. Constitutional: He is oriented to  person, place, and time. He appears well-developed and well-nourished. He is cooperative. No distress.  HENT:  Head: Normocephalic. Head is without raccoon's eyes, without Battle's sign, without abrasion, without contusion and without laceration.  Right Ear: Hearing, tympanic membrane, external ear and ear canal normal. No lacerations. No drainage or tenderness. No foreign bodies. Tympanic membrane is not perforated. No hemotympanum.  Left Ear: Hearing, tympanic membrane, external ear and ear canal normal. No lacerations. No drainage or tenderness. No foreign bodies. Tympanic membrane is not perforated. No hemotympanum.  Nose: Nose normal. No nose lacerations, sinus tenderness, nasal deformity or nasal septal hematoma. No epistaxis.  Mouth/Throat: Uvula is midline, oropharynx is clear and moist and mucous membranes are normal. No lacerations.  Eyes: Conjunctivae, EOM and lids are normal. Pupils are equal, round, and reactive to light. No scleral icterus.  Neck: Trachea normal. No JVD present. No spinous process tenderness and no muscular tenderness present. Carotid bruit is not present. No thyromegaly present.  No posterior midline tenderness, no pain with active range of motion  Cardiovascular: Normal rate, regular rhythm, normal heart sounds, intact distal pulses and normal pulses.   Respiratory: Effort normal and breath sounds normal. No respiratory distress. He exhibits tenderness. He exhibits no bony tenderness, no laceration and no crepitus.  Left-sided chest wall tenderness and mild bony crepitance  GI: Soft. Normal appearance. He exhibits no distension. Bowel sounds are decreased. There is no tenderness. There is no rigidity, no rebound, no guarding and no CVA tenderness.  Musculoskeletal: Normal range of motion. He exhibits no edema and no tenderness.  Lymphadenopathy:    He has no cervical adenopathy.  Neurological: He is alert and oriented to person, place, and time. He has normal  strength. No cranial nerve deficit or sensory deficit. He exhibits normal muscle tone. GCS eye subscore is 4. GCS verbal subscore is 5. GCS motor subscore is 6.  Skin: Skin is warm, dry and intact. He is not diaphoretic.  Psychiatric: He has a normal mood and affect. His speech is normal and behavior is normal.     Assessment/Plan Status post fall with concussion, left rib fractures 3 through 6, tiny hemopneumothorax, and history of anticoagulation. We'll hold Coumadin. Will admit for pulmonary toilet and pain control. Check followup chest x-ray in the morning. Plan was discussed in detail the patient and his wife.  Renaldo Gornick E 01/13/2013, 2:45 PM

## 2013-01-13 NOTE — Consult Note (Signed)
Neurology Consultation Reason for Consult: Syncope Referring Physician: Bernette Mayers, C  CC: Syncope  History is obtained from: Patient, wife  HPI: [name] is a 57 y.o. male who was in his normal state of health this morning, then was loading a truck when he became lightheaded and then passed out. He states that he does not think that he hit his head, but was quite confused after the fall. He has since cleared and is currently back to his baseline. His wife states that she got a call from him and he sounded disoriented and did not know where he was. She told him at that time to call 911.  Since that time, he has complained of chest pain which is left-sided radiating. Of note, he does have a history of atrial fibrillation.   LKW: 7:30 AM tpa given: no, resolved symptoms NIHSS 0    ROS: A 14 point ROS was performed and is negative except as noted in the HPI.  Past Medical History  Diagnosis Date  . HLD (hyperlipidemia)   . Depression   . Allergic rhinitis   . NICM (nonischemic cardiomyopathy)   . Narcotic addiction   . Inguinal hernia without mention of obstruction or gangrene, unilateral or unspecified, (not specified as recurrent)   . Hypertrophy of prostate with urinary obstruction and other lower urinary tract symptoms (LUTS)   . Acquired cyst of kidney   . Complication of anesthesia     "hard to put asleep, hard to wake up, nausea and vomiting"  . PONV (postoperative nausea and vomiting)   . PAF (paroxysmal atrial fibrillation)     ablation done 03/2006, sees Dr. Flossie Buffy, [facility]  . HTN (hypertension)     sees Dr. Elias Else, [facility]  . Asthma     as a child  . Pneumonia     hx of 2004  . Lumbar herniated disc     L7    Family History: COPD, asthma  Social History: Tob: Denies  Exam: Current vital signs: BP 100/75  Pulse 84  Temp(Src) 97.6 F (36.4 C) (Oral)  Resp 19  SpO2 93% Vital signs in last 24 hours: Temp:  [97.6 F (36.4  C)] 97.6 F (36.4 C) (07/10 0929) Pulse Rate:  [75-84] 84 (07/10 0945) Resp:  [13-23] 19 (07/10 0945) BP: (100-154)/(75-91) 100/75 mmHg (07/10 0945) SpO2:  [93 %-96 %] 93 % (07/10 0945)  General: In bed, no apparent distress CV: Regular rate and rhythm Mental Status: Patient is awake, alert, oriented to person, place, month, year, and situation. Immediate and remote memory are intact. Patient is able to give a clear and coherent history. No signs of aphasia or neglect Cranial Nerves: II: Visual Fields are full. Pupils are equal, round, and reactive to light.  Discs are difficult to visualize. III,IV, VI: EOMI without ptosis or diploplia.  V: Facial sensation is symmetric to temperature VII: Facial movement is symmetric.  VIII: hearing is intact to voice X: Uvula elevates symmetrically XI: Shoulder shrug is symmetric. XII: tongue is midline without atrophy or fasciculations.  Motor: Tone is normal. Bulk is normal. 5/5 strength was present in all four extremities.  Sensory: Sensation is symmetric to light touch and temperature in the arms and legs. Deep Tendon Reflexes: 2+ and symmetric in the biceps and patellae.  Cerebellar: FNF and HKS are intact bilaterally Gait: Not assessed due to acute nature of evaluation and multiple medical monitors in ED setting.  I have reviewed labs in epic  and the results pertinent to this consultation are: BMP unremarkable CBC unremarkable  I have reviewed the images obtained: CT head-negative acute  Impression: 57 year old male with syncope in the setting of chest pain. Given the prolonged confusion after the event, it would be reasonable to obtain an EEG, but this is negative then I would not pursue further seizure workup given that this is his only event. I do wonder if it is possible that he hit his head without knowing it, which could explain confusion after the event. This is less likely, however since he does not have any scalp  tenderness.  Recommendations: 1) EEG 2) no reason to suspect TIA at this time 3) workup for chest pain previous physicians.   Ritta Slot, MD [facility] [phone]  If 7pm- 7am, please page neurology on call at [phone].

## 2013-01-13 NOTE — ED Notes (Signed)
Urine sample request.  Patient unable to void at this time.  Urinal at bedside.

## 2013-01-13 NOTE — Progress Notes (Signed)
EEG was normal, per ER patient has some signs of head injury. He likely fell with concussion. I do not feel that any further neurodiagnostic studies are needed at this time. Neurology will sign off.   Ritta Slot, MD [facility] [phone]  If 7pm- 7am, please page neurology on call at [phone].

## 2013-01-13 NOTE — Code Documentation (Signed)
NIHSS=0; pt describes syncopal event when he arrived at nursery to get pine straw.  He fell out of the truck; was able to call his wife to say something was wrong; she describes him as confused and having problems getting his thoughts straight. He believes he lost consciousness briefly.  Dr. Amada Jupiter has canceled code stroke as there are no focal deficits. Handoff done with EDRN.  Pt / family updated re: stroke ruled out.

## 2013-01-13 NOTE — ED Notes (Signed)
Patient transported to EEG

## 2013-01-13 NOTE — ED Notes (Signed)
Patient transported to CT 

## 2013-01-13 NOTE — ED Notes (Signed)
Pt from Nursery, c/o altered mental status and chest pressure. Pt was found by ems, eyes open but unresponsive, flat affect, became alert on ed arrival. Pt recalls loading pine straw from trailer. Per wife, pt called her from truck, pt does not recall calling.

## 2013-01-13 NOTE — Procedures (Signed)
History: 57 yo M with transient loss of conciousness  Sedation: None  Background: There is a well defined posterior dominant rhythm of 9.5 Hz that attenuates with eye opening. There was intermixed alpha and beta activities with an increase in delta during drowsiness. Normal symmetric sleep structures were observed.   Photic stimulation: Physiologic driving is Present  EEG Abnormalities: None  Clinical Interpretation: This normal EEG is recorded in the waking and sleep state. There was no seizure or seizure predisposition recorded on this study.   Ritta Slot, MD [facility] [phone]  If 7pm- 7am, please page neurology on call at [phone].

## 2013-01-13 NOTE — Progress Notes (Signed)
EEG Completed; Results Pending  

## 2013-01-14 ENCOUNTER — Observation Stay (HOSPITAL_COMMUNITY): Payer: BC Managed Care – PPO

## 2013-01-14 MED ORDER — WARFARIN SODIUM 5 MG PO TABS: 5.0000 mg | ORAL_TABLET | ORAL | Status: DC

## 2013-01-14 MED ORDER — OXYCODONE HCL 5 MG PO TABS: 5.0000 mg | ORAL_TABLET | Freq: Four times a day (QID) | ORAL | Status: DC | PRN

## 2013-01-14 MED ORDER — ONDANSETRON HCL 4 MG PO TABS: 4.0000 mg | ORAL_TABLET | Freq: Three times a day (TID) | ORAL | Status: DC | PRN

## 2013-01-14 NOTE — Discharge Summary (Signed)
Okay to go home.  This patient has been seen and I agree with the findings and treatment plan.  Wil Slape O. Kaleth Koy, III, MD, FACS [phone] (pager) [phone] (direct pager) Trauma Surgeon 

## 2013-01-14 NOTE — Progress Notes (Signed)
Outside of a headache, the patient is doing well.  This patient has been seen and I agree with the findings and treatment plan.  Marta Lamas. Gae Bon, MD, FACS [phone] (pager) [phone] (direct pager) Trauma Surgeon

## 2013-01-14 NOTE — Discharge Summary (Signed)
Physician Discharge Summary  Patient ID: EMRIC KOWALEWSKI MRN: [id number] DOB/AGE: 07-Aug-1955 57 y.o.  Admit date: 01/13/2013 Discharge date: 01/14/2013  Admitting Diagnosis: Fall Concussion Multiple left rib fractures Left traumatic pneumothorax  Discharge Diagnosis Patient Active Problem List   Diagnosis Date Noted  . Concussion 01/13/2013  . Fall 01/13/2013  . Multiple fractures of ribs of left side 01/13/2013  . Traumatic pneumothorax 01/13/2013  . Long term current use of anticoagulant 09/18/2010  . MITRAL INSUFFICIENCY 03/14/2009  . CARDIOMYOPATHY 03/14/2009  . DEPRESSION 02/13/2009  . ALLERGIC RHINITIS 02/13/2009  . HYPERLIPIDEMIA 02/12/2009  . HYPERTENSION 02/12/2009  . PAROXYSMAL ATRIAL FIBRILLATION 02/12/2009    Consultants None  Imaging: Dg Ribs Unilateral W/chest Left  01/13/2013   *RADIOLOGY REPORT*  Clinical Data: Chest pain.  Loss of consciousness.  Fall.  LEFT RIBS AND CHEST - 3+ VIEW  Comparison: T 06/25/2013 chest x-ray.  Findings: Left third, fourth, fifth and sixth rib fractures with slightly displacement fracture fragments.  Subcutaneous emphysema left chest wall suggest there is communication with the lung however, no apical or lateral pneumothorax is demonstrated.  There is however, mild increased prominence of the pleural line when compared to the prior chest x-ray which may reflect a small amount of hemorrhage associated with the rib fractures.  Cardiomegaly.  Central pulmonary vascular prominence.  IMPRESSION: Left third, fourth, fifth and sixth rib fractures with slightly displacement fracture fragments.  Subcutaneous emphysema left chest wall suggest there is communication with the lung however, no apical or lateral pneumothorax is demonstrated.  There is however, mild increased prominence of the pleural line when compared to the prior chest x-ray which may reflect a small amount of hemorrhage associated with the rib fractures.  Critical Value/emergent  results were called by telephone at the time of interpretation on 01/13/2013 at 12:49 p.m. to Dr. Renae Gloss , who verbally acknowledged these results.   Original Report Authenticated By: Lacy Duverney, M.D.   Ct Head Wo Contrast  01/13/2013   *RADIOLOGY REPORT*  Clinical Data: Syncopal episode.  Confusion.  CT HEAD WITHOUT CONTRAST  Technique:  Contiguous axial images were obtained from the base of the skull through the vertex without contrast.  Comparison: None.  Findings: No intracranial hemorrhage.  No CT evidence of large acute infarct.  No hydrocephalus.  No intracranial mass lesion detected on this unenhanced exam.  Air-fluid level right maxillary sinus suggestive of result of acute sinusitis.  If there were trauma to the orbital region and orbital floor blowout fracture were of concern then CT of the orbits can be obtained for further delineation.  No findings of skull fracture.  IMPRESSION: No intracranial hemorrhage.  No CT evidence of large acute infarct.  Air-fluid level right maxillary sinus suggestive of result of acute sinusitis.  If there were trauma to the orbital region and orbital floor blowout fracture were of concern then CT of the orbits can be obtained for further delineation.  This is a code stroke discussed with Dr. Amada Jupiter 01/13/2013 9:32 a.m.   Original Report Authenticated By: Lacy Duverney, M.D.   Ct Chest W Contrast  01/13/2013   *RADIOLOGY REPORT*  Clinical Data:  Fall with known left-sided rib fractures.  CT CHEST, ABDOMEN AND PELVIS WITH CONTRAST  Technique:  Multidetector CT imaging of the chest, abdomen and pelvis was performed following the standard protocol during bolus administration of intravenous contrast.  Contrast: OMNIPAQUE IOHEXOL 300 MG/ML  SOLN  Comparison:  07/19/2012 abdominal CT.  CT CHEST  Findings:  THORACIC  INLET/BODY WALL:  Left-sided subcutaneous emphysema related to rib fractures and air leak.  MEDIASTINUM:  Normal heart size.  No pericardial  effusion.  No acute vascular abnormality.  No adenopathy.  LUNG WINDOWS:  Trace left pneumothorax.  Mild dependent atelectasis.  OSSEOUS:  There are acute fractures of the left lateral third, fourth, fifth, and sixth ribs.  At most there is 50% displacement.  The fourth and fifth ribs likely have segmental fractures posteriorly, nondisplaced.  Critical Value/emergent results were called by telephone at the time of interpretation on January 13, 2013.  at 02:10 p.m. to Dr. Bernette Mayers, who verbally acknowledged these results.  IMPRESSION: 1.  Trace left pneumothorax. 2.  Lateral left third through sixth rib fractures, with segmental fractures involving the fourth and fifth ribs posteriorly.  CT ABDOMEN AND PELVIS  Findings:  Liver: No focal abnormality.  Biliary: No evidence of biliary obstruction or stone.  Pancreas: Unremarkable.  Spleen: Unremarkable.  Adrenals: Unremarkable.  Kidneys and ureters: No evidence of injury.  1 cm cyst exophytic from the lower pole left kidney is stable.  A 6 cm cyst exophytic from the interpolar right kidney is likewise unchanged, including a small cortical cyst just anterior, 6 mm in diameter.  Bladder: Borderline thickening, likely related to an enlarged prostate.  Bowel: No obstruction. Normal appendix.  Retroperitoneum: No mass or adenopathy.  Peritoneum: No free fluid or gas.  Reproductive: Mild prostate enlargement with central calcifications.  Vascular: No acute abnormality.  OSSEOUS: No acute abnormalities. SI joint ankylosis bilaterally, likely degenerative given marginal spurring.  Notable right os acetabulum.  IMPRESSION: No acute intra-abdominal abnormality.   Original Report Authenticated By: Tiburcio Pea   Ct Abdomen Pelvis W Contrast  01/13/2013   *RADIOLOGY REPORT*  Clinical Data:  Fall with known left-sided rib fractures.  CT CHEST, ABDOMEN AND PELVIS WITH CONTRAST  Technique:  Multidetector CT imaging of the chest, abdomen and pelvis was performed following the standard  protocol during bolus administration of intravenous contrast.  Contrast: OMNIPAQUE IOHEXOL 300 MG/ML  SOLN  Comparison:  07/19/2012 abdominal CT.  CT CHEST  Findings:  THORACIC INLET/BODY WALL:  Left-sided subcutaneous emphysema related to rib fractures and air leak.  MEDIASTINUM:  Normal heart size.  No pericardial effusion.  No acute vascular abnormality.  No adenopathy.  LUNG WINDOWS:  Trace left pneumothorax.  Mild dependent atelectasis.  OSSEOUS:  There are acute fractures of the left lateral third, fourth, fifth, and sixth ribs.  At most there is 50% displacement.  The fourth and fifth ribs likely have segmental fractures posteriorly, nondisplaced.  Critical Value/emergent results were called by telephone at the time of interpretation on January 13, 2013.  at 02:10 p.m. to Dr. Bernette Mayers, who verbally acknowledged these results.  IMPRESSION: 1.  Trace left pneumothorax. 2.  Lateral left third through sixth rib fractures, with segmental fractures involving the fourth and fifth ribs posteriorly.  CT ABDOMEN AND PELVIS  Findings:  Liver: No focal abnormality.  Biliary: No evidence of biliary obstruction or stone.  Pancreas: Unremarkable.  Spleen: Unremarkable.  Adrenals: Unremarkable.  Kidneys and ureters: No evidence of injury.  1 cm cyst exophytic from the lower pole left kidney is stable.  A 6 cm cyst exophytic from the interpolar right kidney is likewise unchanged, including a small cortical cyst just anterior, 6 mm in diameter.  Bladder: Borderline thickening, likely related to an enlarged prostate.  Bowel: No obstruction. Normal appendix.  Retroperitoneum: No mass or adenopathy.  Peritoneum: No free fluid or  gas.  Reproductive: Mild prostate enlargement with central calcifications.  Vascular: No acute abnormality.  OSSEOUS: No acute abnormalities. SI joint ankylosis bilaterally, likely degenerative given marginal spurring.  Notable right os acetabulum.  IMPRESSION: No acute intra-abdominal abnormality.    Original Report Authenticated By: Tiburcio Pea   Dg Chest Port 1 View  01/14/2013   *RADIOLOGY REPORT*  Clinical Data: Follow up pneumothorax  PORTABLE CHEST - 1 VIEW  Comparison: Portable exam 0703 hours compared to 01/13/2013  Findings: Enlargement of cardiac silhouette. Tortuous thoracic aorta. Peribronchial thickening and slight accentuation of perihilar markings. Mild bibasilar atelectasis greater on left. No definite acute infiltrate, pleural effusion or pneumothorax. Displaced fractures of lateral left fourth and questionably fifth ribs noted.  IMPRESSION: Mild bibasilar atelectasis greater on left. No definite pneumothorax. Bronchitic changes.   Original Report Authenticated By: Ulyses Southward, M.D.    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 He was in the back of a tractor trailer moving bails of pine needles.  When he went to get out of the truck, he fell striking his head and left chest. Questionable loss of consciousness at the scene.  He was amnestic to a lot of the event. He denied any presyncope or dizziness prior to falling. He had mental status changes afterwards and was evaluated in the emergency department. There was initially concern that he had a stroke. His mental status improved and they realized that he fell out of the truck. We were called to see him for admission to the trauma service after he was found to have concussion and multiple left-sided rib fractures with small pneumothorax.  He was admitted for pain control and monitoring.  Diet was advanced as tolerated.  On HD #2, the patient was voiding well, tolerating diet, ambulating well, pain well controlled, vital signs stable, and felt stable for discharge home.  Patient will follow up in our office in 2 weeks and knows to call with questions or concerns.      Medication List         acetaminophen 325 MG tablet  Commonly known as:  TYLENOL  Take 650 mg by mouth every 6 (six) hours as  needed. For pain     DULoxetine 30 MG capsule  Commonly known as:  CYMBALTA  Take 30 mg by mouth daily.     lisinopril 40 MG tablet  Commonly known as:  PRINIVIL,ZESTRIL  Take 1 tablet (40 mg total) by mouth daily.     montelukast 10 MG tablet  Commonly known as:  SINGULAIR  Take 10 mg by mouth at bedtime as needed. Allergies.     ondansetron 4 MG tablet  Commonly known as:  ZOFRAN  Take 1 tablet (4 mg total) by mouth every 8 (eight) hours as needed for nausea.     oxyCODONE 5 MG immediate release tablet  Commonly known as:  Oxy IR/ROXICODONE  Take 1-3 tablets (5-15 mg total) by mouth every 6 (six) hours as needed (Take 1 tablet for mild pain, take 2 tablets for moderate pain, take 3 tablets for severe pain; do not drive while taking this medication.).     sodium chloride 0.65 % nasal spray  Commonly known as:  OCEAN  Place 1 spray into the nose as needed for congestion.     TAZTIA XT 360 MG 24 hr capsule  Generic drug:  diltiazem  Take 360 mg by mouth every morning.     traMADol 50 MG tablet  Commonly known as:  ULTRAM  Take 100-200 mg by mouth every 8 (eight) hours as needed for pain.     warfarin 5 MG tablet  Commonly known as:  COUMADIN  Take 5-7.5 mg by mouth See admin instructions. Takes 5mg  everyday except on Wednesday. On Wednesday he takes 7.5mg         Follow-up Information   Call [facility]. (If symptoms worsen or if your concussion symptoms do not resove in 1-2 weeks)    Contact information:   [address] [place] [address] [phone]       Signed: Candiss Norse [facility] [phone]  01/14/2013, 9:12 AM

## 2013-01-14 NOTE — Progress Notes (Signed)
UR completed 

## 2013-01-14 NOTE — Progress Notes (Signed)
LOS: 1 day   Subjective: Pt c/o left sided rib pain and and pain with coughing, laughing, and deep breathing.  Pt has been walking around and using his IS.  He is tolerating a regular diet and urinating well.  +flatus, but no BM yet.  Objective: Vital signs in last 24 hours: Temp:  [97.6 F (36.4 C)-98.3 F (36.8 C)] 98.2 F (36.8 C) (07/11 0657) Pulse Rate:  [75-107] 82 (07/11 0657) Resp:  [13-29] 19 (07/11 0657) BP: (100-171)/(75-96) 144/95 mmHg (07/11 0657) SpO2:  [89 %-97 %] 96 % (07/11 0657) Weight:  [271 lb 8 oz (123.152 kg)] 271 lb 8 oz (123.152 kg) (07/10 1120) Last BM Date: 01/12/13  Lab Results:  CBC  Recent Labs  01/13/13 0920 01/13/13 0926  WBC 8.9  --   HGB 14.6 15.3  HCT 42.3 45.0  PLT 276  --    BMET  Recent Labs  01/13/13 0920 01/13/13 0926  NA 138 140  K 3.9 3.9  CL 101 102  CO2 27  --   GLUCOSE 117* 116*  BUN 19 19  CREATININE 1.06 1.00  CALCIUM 9.2  --     Imaging: Dg Ribs Unilateral W/chest Left  01/13/2013   *RADIOLOGY REPORT*  Clinical Data: Chest pain.  Loss of consciousness.  Fall.  LEFT RIBS AND CHEST - 3+ VIEW  Comparison: T 06/25/2013 chest x-ray.  Findings: Left third, fourth, fifth and sixth rib fractures with slightly displacement fracture fragments.  Subcutaneous emphysema left chest wall suggest there is communication with the lung however, no apical or lateral pneumothorax is demonstrated.  There is however, mild increased prominence of the pleural line when compared to the prior chest x-ray which may reflect a small amount of hemorrhage associated with the rib fractures.  Cardiomegaly.  Central pulmonary vascular prominence.  IMPRESSION: Left third, fourth, fifth and sixth rib fractures with slightly displacement fracture fragments.  Subcutaneous emphysema left chest wall suggest there is communication with the lung however, no apical or lateral pneumothorax is demonstrated.  There is however, mild increased prominence of the pleural  line when compared to the prior chest x-ray which may reflect a small amount of hemorrhage associated with the rib fractures.  Critical Value/emergent results were called by telephone at the time of interpretation on 01/13/2013 at 12:49 p.m. to Dr. Renae Gloss , who verbally acknowledged these results.   Original Report Authenticated By: Lacy Duverney, M.D.   Ct Head Wo Contrast  01/13/2013   *RADIOLOGY REPORT*  Clinical Data: Syncopal episode.  Confusion.  CT HEAD WITHOUT CONTRAST  Technique:  Contiguous axial images were obtained from the base of the skull through the vertex without contrast.  Comparison: None.  Findings: No intracranial hemorrhage.  No CT evidence of large acute infarct.  No hydrocephalus.  No intracranial mass lesion detected on this unenhanced exam.  Air-fluid level right maxillary sinus suggestive of result of acute sinusitis.  If there were trauma to the orbital region and orbital floor blowout fracture were of concern then CT of the orbits can be obtained for further delineation.  No findings of skull fracture.  IMPRESSION: No intracranial hemorrhage.  No CT evidence of large acute infarct.  Air-fluid level right maxillary sinus suggestive of result of acute sinusitis.  If there were trauma to the orbital region and orbital floor blowout fracture were of concern then CT of the orbits can be obtained for further delineation.  This is a code stroke discussed with Dr. Amada Jupiter 01/13/2013 9:32 a.m.  Original Report Authenticated By: Lacy Duverney, M.D.   Ct Chest W Contrast  01/13/2013   *RADIOLOGY REPORT*  Clinical Data:  Fall with known left-sided rib fractures.  CT CHEST, ABDOMEN AND PELVIS WITH CONTRAST  Technique:  Multidetector CT imaging of the chest, abdomen and pelvis was performed following the standard protocol during bolus administration of intravenous contrast.  Contrast: OMNIPAQUE IOHEXOL 300 MG/ML  SOLN  Comparison:  07/19/2012 abdominal CT.  CT CHEST  Findings:   THORACIC INLET/BODY WALL:  Left-sided subcutaneous emphysema related to rib fractures and air leak.  MEDIASTINUM:  Normal heart size.  No pericardial effusion.  No acute vascular abnormality.  No adenopathy.  LUNG WINDOWS:  Trace left pneumothorax.  Mild dependent atelectasis.  OSSEOUS:  There are acute fractures of the left lateral third, fourth, fifth, and sixth ribs.  At most there is 50% displacement.  The fourth and fifth ribs likely have segmental fractures posteriorly, nondisplaced.  Critical Value/emergent results were called by telephone at the time of interpretation on January 13, 2013.  at 02:10 p.m. to Dr. Bernette Mayers, who verbally acknowledged these results.  IMPRESSION: 1.  Trace left pneumothorax. 2.  Lateral left third through sixth rib fractures, with segmental fractures involving the fourth and fifth ribs posteriorly.  CT ABDOMEN AND PELVIS  Findings:  Liver: No focal abnormality.  Biliary: No evidence of biliary obstruction or stone.  Pancreas: Unremarkable.  Spleen: Unremarkable.  Adrenals: Unremarkable.  Kidneys and ureters: No evidence of injury.  1 cm cyst exophytic from the lower pole left kidney is stable.  A 6 cm cyst exophytic from the interpolar right kidney is likewise unchanged, including a small cortical cyst just anterior, 6 mm in diameter.  Bladder: Borderline thickening, likely related to an enlarged prostate.  Bowel: No obstruction. Normal appendix.  Retroperitoneum: No mass or adenopathy.  Peritoneum: No free fluid or gas.  Reproductive: Mild prostate enlargement with central calcifications.  Vascular: No acute abnormality.  OSSEOUS: No acute abnormalities. SI joint ankylosis bilaterally, likely degenerative given marginal spurring.  Notable right os acetabulum.  IMPRESSION: No acute intra-abdominal abnormality.   Original Report Authenticated By: Tiburcio Pea   Ct Abdomen Pelvis W Contrast  01/13/2013   *RADIOLOGY REPORT*  Clinical Data:  Fall with known left-sided rib fractures.   CT CHEST, ABDOMEN AND PELVIS WITH CONTRAST  Technique:  Multidetector CT imaging of the chest, abdomen and pelvis was performed following the standard protocol during bolus administration of intravenous contrast.  Contrast: OMNIPAQUE IOHEXOL 300 MG/ML  SOLN  Comparison:  07/19/2012 abdominal CT.  CT CHEST  Findings:  THORACIC INLET/BODY WALL:  Left-sided subcutaneous emphysema related to rib fractures and air leak.  MEDIASTINUM:  Normal heart size.  No pericardial effusion.  No acute vascular abnormality.  No adenopathy.  LUNG WINDOWS:  Trace left pneumothorax.  Mild dependent atelectasis.  OSSEOUS:  There are acute fractures of the left lateral third, fourth, fifth, and sixth ribs.  At most there is 50% displacement.  The fourth and fifth ribs likely have segmental fractures posteriorly, nondisplaced.  Critical Value/emergent results were called by telephone at the time of interpretation on January 13, 2013.  at 02:10 p.m. to Dr. Bernette Mayers, who verbally acknowledged these results.  IMPRESSION: 1.  Trace left pneumothorax. 2.  Lateral left third through sixth rib fractures, with segmental fractures involving the fourth and fifth ribs posteriorly.  CT ABDOMEN AND PELVIS  Findings:  Liver: No focal abnormality.  Biliary: No evidence of biliary obstruction or stone.  Pancreas: Unremarkable.  Spleen: Unremarkable.  Adrenals: Unremarkable.  Kidneys and ureters: No evidence of injury.  1 cm cyst exophytic from the lower pole left kidney is stable.  A 6 cm cyst exophytic from the interpolar right kidney is likewise unchanged, including a small cortical cyst just anterior, 6 mm in diameter.  Bladder: Borderline thickening, likely related to an enlarged prostate.  Bowel: No obstruction. Normal appendix.  Retroperitoneum: No mass or adenopathy.  Peritoneum: No free fluid or gas.  Reproductive: Mild prostate enlargement with central calcifications.  Vascular: No acute abnormality.  OSSEOUS: No acute abnormalities. SI joint  ankylosis bilaterally, likely degenerative given marginal spurring.  Notable right os acetabulum.  IMPRESSION: No acute intra-abdominal abnormality.   Original Report Authenticated By: Tiburcio Pea     PE: General appearance: alert, cooperative and no distress HEENT:  Left parietal edema, PERRL, EOM's intact, MMM Resp: clear to auscultation bilaterally Chest wall: left sided chest wall tenderness Cardio: regular rate and rhythm, S1, S2 normal, no murmur, click, rub or gallop GI: soft, non-tender; bowel sounds normal; no masses,  no organomegaly Extremities: extremities normal, atraumatic, no cyanosis or edema, balance minimally impaired Pulses: 2+ and symmetric Neuro/Psych:  A&O x 3, having some confusion with ST memory, CN2-12 intact   Assessment/Plan: Fall from height (ladder) Concussion - headache and some ST memory confusion Left rib fxs Small traumatic pneumothorax - stable, doing 2250 on IS, pain controlled Long term anticoagulation for AFIB, s/p ablation at [facility] - restart coumadin today VTE - SCD's, Lovenox, restart coumadin 5mg  daily FEN - Fluids KVO Dispo -- likely discharge today   Candiss Norse Pager: [phone] General Trauma [place] Pager: [phone]   01/14/2013

## 2013-03-01 ENCOUNTER — Other Ambulatory Visit: Payer: Self-pay | Admitting: Cardiology

## 2013-03-04 ENCOUNTER — Other Ambulatory Visit: Payer: Self-pay | Admitting: Pharmacist

## 2013-03-04 MED ORDER — WARFARIN SODIUM 5 MG PO TABS: ORAL_TABLET | ORAL | Status: DC

## 2013-03-14 ENCOUNTER — Telehealth: Payer: Self-pay | Admitting: Pharmacist

## 2013-03-14 NOTE — Telephone Encounter (Signed)
New Problem  Pt is calling back about coming in// request a call back to discuss.

## 2013-03-30 NOTE — Telephone Encounter (Signed)
Unable to reach pt to schedule a follow up appt.

## 2013-04-18 ENCOUNTER — Ambulatory Visit (INDEPENDENT_AMBULATORY_CARE_PROVIDER_SITE_OTHER): Payer: BC Managed Care – PPO | Admitting: *Deleted

## 2013-04-18 DIAGNOSIS — I4891 Unspecified atrial fibrillation: Secondary | ICD-10-CM

## 2013-04-18 MED ORDER — WARFARIN SODIUM 5 MG PO TABS: ORAL_TABLET | ORAL | Status: DC

## 2013-05-31 ENCOUNTER — Other Ambulatory Visit: Payer: Self-pay | Admitting: Cardiology

## 2013-06-06 ENCOUNTER — Other Ambulatory Visit: Payer: Self-pay | Admitting: Cardiology

## 2013-08-02 ENCOUNTER — Ambulatory Visit (INDEPENDENT_AMBULATORY_CARE_PROVIDER_SITE_OTHER): Payer: BC Managed Care – PPO | Admitting: *Deleted

## 2013-08-02 DIAGNOSIS — Z5181 Encounter for therapeutic drug level monitoring: Secondary | ICD-10-CM | POA: Insufficient documentation

## 2013-08-02 DIAGNOSIS — I4891 Unspecified atrial fibrillation: Secondary | ICD-10-CM

## 2013-08-02 LAB — POCT INR: INR: 1.6

## 2013-08-02 MED ORDER — WARFARIN SODIUM 5 MG PO TABS: 5.0000 mg | ORAL_TABLET | ORAL | Status: DC

## 2013-08-24 ENCOUNTER — Encounter: Payer: BC Managed Care – PPO | Admitting: Cardiology

## 2013-08-24 NOTE — Progress Notes (Signed)
HPI: FU paroxysmal atrial fibrillation. The patient had a cardiac catheterization in 2004 that showed an ejection fraction of 45% and normal coronary arteries. He did have atrial fibrillation at that time and his LV function improved after sinus rhythm was restored. He ultimately had atrial fibrillation ablation. He did well for several years. However in August of 2010 he was admitted to [facility] with recurrent atrial fibrillation. TSH was normal. An echocardiogram performed on August 11 of 2010 showed an ejection fraction of 35-40%. There was biatrial enlargement. There was at least moderate mitral regurgitation. The patient subsequently had a transesophageal echocardiogram that showed an ejection fraction of 35% and moderate mitral regurgitation. He underwent cardioversion at that time as there was no left atrial appendage thrombus. He was seen in followup by Dr. Rayann Heman and close followup was felt to be indicated. Possible attempt at another ablation could be considered in the future if his atrial fibrillation recurred. Repeat echo in Dec 2012 showed an EF of 60-65 and grade 2 diastolic dysfunction. Patient had last episode of atrial fibrillation in Jan 2013 and required DCCV 2/13. I last saw him in Sept 2013. Since then,    Current Outpatient Prescriptions  Medication Sig Dispense Refill  . acetaminophen (TYLENOL) 325 MG tablet Take 650 mg by mouth every 6 (six) hours as needed. For pain      . diltiazem (TAZTIA XT) 360 MG 24 hr capsule Take 360 mg by mouth every morning.      . DULoxetine (CYMBALTA) 30 MG capsule Take 30 mg by mouth daily.        Marland Kitchen lisinopril (PRINIVIL,ZESTRIL) 40 MG tablet Take 1 tablet (40 mg total) by mouth daily.  30 tablet  12  . montelukast (SINGULAIR) 10 MG tablet Take 10 mg by mouth at bedtime as needed. Allergies.      Marland Kitchen ondansetron (ZOFRAN) 4 MG tablet Take 1 tablet (4 mg total) by mouth every 8 (eight) hours as needed for nausea.  30 tablet  0  .  oxyCODONE (OXY IR/ROXICODONE) 5 MG immediate release tablet Take 1-3 tablets (5-15 mg total) by mouth every 6 (six) hours as needed (Take 1 tablet for mild pain, take 2 tablets for moderate pain, take 3 tablets for severe pain; do not drive while taking this medication.).  40 tablet  0  . sodium chloride (OCEAN) 0.65 % nasal spray Place 1 spray into the nose as needed for congestion.      . traMADol (ULTRAM) 50 MG tablet Take 100-200 mg by mouth every 8 (eight) hours as needed for pain.      Marland Kitchen warfarin (COUMADIN) 5 MG tablet Take 1 tablet (5 mg total) by mouth as directed.  40 tablet  1   No current facility-administered medications for this visit.     Past Medical History  Diagnosis Date  . HLD (hyperlipidemia)   . Depression   . Allergic rhinitis   . NICM (nonischemic cardiomyopathy)   . Narcotic addiction   . Inguinal hernia without mention of obstruction or gangrene, unilateral or unspecified, (not specified as recurrent)   . Hypertrophy of prostate with urinary obstruction and other lower urinary tract symptoms (LUTS)   . Complication of anesthesia     "hard to put asleep, hard to wake up, nausea and vomiting"  . PONV (postoperative nausea and vomiting)   . PAF (paroxysmal atrial fibrillation)     ablation done 03/2006, sees Dr. Ginger Organ, [facility]  . HTN (  hypertension)     sees Dr. [name], [facility]  . Asthma     as a child  . Pneumonia     hx of 2004  . Lumbar herniated disc     L7    Past Surgical History  Procedure Laterality Date  . Atrial ablation surgery  2007    [facility]  . Cardioversion  08/11/2011    Procedure: CARDIOVERSION;  Surgeon: Lelon Perla, MD;  Location: [place];  Service: Cardiovascular;  Laterality: N/A;  . Hernia repair      1977  . Eye surgery      lasik, 1998  . Inguinal hernia repair Right 08/26/2012    Procedure: LAPAROSCOPIC INGUINAL HERNIA;  Surgeon: Madilyn Hook, DO;  [facility];  Service: General;  Laterality: Right;   laparoscopic right inguinal hernia repair with mesh, umbilical hernia repair  . Insertion of mesh Right 08/26/2012    Procedure: INSERTION OF MESH;  Surgeon: Madilyn Hook, DO;  Location: [place];  Service: General;  Laterality: Right;  right inguinal hernia  . Umbilical hernia repair N/A 08/26/2012    Procedure: HERNIA REPAIR UMBILICAL ADULT;  Surgeon: Madilyn Hook, DO;  Location: [place];  Service: General;  Laterality: N/A;    History   Social History  . Marital Status: Married    Spouse Name: N/A    Number of Children: N/A  . Years of Education: N/A   Occupational History  . owns landscaping business    Social History Main Topics  . Smoking status: Never Smoker   . Smokeless tobacco: Not on file  . Alcohol Use: No  . Drug Use: No  . Sexual Activity: Yes   Other Topics Concern  . Not on file   Social History Narrative  . No narrative on file    ROS: no fevers or chills, productive cough, hemoptysis, dysphasia, odynophagia, melena, hematochezia, dysuria, hematuria, rash, seizure activity, orthopnea, PND, pedal edema, claudication. Remaining systems are negative.  Physical Exam: Well-developed well-nourished in no acute distress.  Skin is warm and dry.  HEENT is normal.  Neck is supple.  Chest is clear to auscultation with normal expansion.  Cardiovascular exam is regular rate and rhythm.  Abdominal exam nontender or distended. No masses palpated. Extremities show no edema. neuro grossly intact  ECG     This encounter was created in error - please disregard.

## 2013-08-31 ENCOUNTER — Ambulatory Visit: Payer: BC Managed Care – PPO | Admitting: Cardiology

## 2013-10-20 ENCOUNTER — Other Ambulatory Visit: Payer: Self-pay | Admitting: Cardiology

## 2013-10-26 ENCOUNTER — Ambulatory Visit (INDEPENDENT_AMBULATORY_CARE_PROVIDER_SITE_OTHER): Payer: BC Managed Care – PPO | Admitting: *Deleted

## 2013-10-26 DIAGNOSIS — Z5181 Encounter for therapeutic drug level monitoring: Secondary | ICD-10-CM

## 2013-10-26 DIAGNOSIS — I4891 Unspecified atrial fibrillation: Secondary | ICD-10-CM

## 2013-10-26 LAB — POCT INR: INR: 1.2

## 2013-10-26 MED ORDER — WARFARIN SODIUM 5 MG PO TABS: ORAL_TABLET | ORAL | Status: DC

## 2013-12-05 ENCOUNTER — Ambulatory Visit (INDEPENDENT_AMBULATORY_CARE_PROVIDER_SITE_OTHER): Payer: BC Managed Care – PPO | Admitting: *Deleted

## 2013-12-05 DIAGNOSIS — Z5181 Encounter for therapeutic drug level monitoring: Secondary | ICD-10-CM

## 2013-12-05 DIAGNOSIS — I4891 Unspecified atrial fibrillation: Secondary | ICD-10-CM

## 2013-12-05 LAB — POCT INR: INR: 1

## 2013-12-05 MED ORDER — WARFARIN SODIUM 5 MG PO TABS: ORAL_TABLET | ORAL | Status: DC

## 2013-12-16 ENCOUNTER — Ambulatory Visit (INDEPENDENT_AMBULATORY_CARE_PROVIDER_SITE_OTHER): Payer: BC Managed Care – PPO

## 2013-12-16 DIAGNOSIS — I4891 Unspecified atrial fibrillation: Secondary | ICD-10-CM

## 2013-12-16 DIAGNOSIS — Z5181 Encounter for therapeutic drug level monitoring: Secondary | ICD-10-CM

## 2013-12-16 LAB — POCT INR: INR: 2

## 2014-01-08 ENCOUNTER — Other Ambulatory Visit: Payer: Self-pay | Admitting: Cardiology

## 2014-01-11 ENCOUNTER — Ambulatory Visit (INDEPENDENT_AMBULATORY_CARE_PROVIDER_SITE_OTHER): Payer: BC Managed Care – PPO | Admitting: *Deleted

## 2014-01-11 DIAGNOSIS — I4891 Unspecified atrial fibrillation: Secondary | ICD-10-CM

## 2014-01-11 DIAGNOSIS — Z5181 Encounter for therapeutic drug level monitoring: Secondary | ICD-10-CM

## 2014-01-11 LAB — POCT INR: INR: 1

## 2014-01-11 MED ORDER — WARFARIN SODIUM 5 MG PO TABS: ORAL_TABLET | ORAL | Status: DC

## 2014-01-19 NOTE — Telephone Encounter (Signed)
Close encounter 

## 2014-03-21 ENCOUNTER — Ambulatory Visit (INDEPENDENT_AMBULATORY_CARE_PROVIDER_SITE_OTHER): Payer: BC Managed Care – PPO | Admitting: *Deleted

## 2014-03-21 DIAGNOSIS — I4891 Unspecified atrial fibrillation: Secondary | ICD-10-CM

## 2014-03-21 DIAGNOSIS — Z5181 Encounter for therapeutic drug level monitoring: Secondary | ICD-10-CM

## 2014-03-21 LAB — POCT INR: INR: 1

## 2014-03-21 MED ORDER — WARFARIN SODIUM 5 MG PO TABS: 5.0000 mg | ORAL_TABLET | ORAL | Status: DC

## 2014-03-21 NOTE — Patient Instructions (Signed)
GI MD need to call and/or  Fax a clearance note to Dr Stanford Breed to get the [facility] to hold your coumadin. Phone # [phone],  Fax # [phone].

## 2014-03-31 ENCOUNTER — Telehealth: Payer: Self-pay | Admitting: Cardiology

## 2014-03-31 NOTE — Telephone Encounter (Signed)
Spoke with [name], aware pt has not seen dr Stanford Breed since 2013. He will need an appointment for clearance. This is a screening colonoscopy

## 2014-03-31 NOTE — Telephone Encounter (Signed)
Forward to Dr Lyda Kalata MATHIS RN

## 2014-03-31 NOTE — Telephone Encounter (Signed)
Pt is having a colonoscopy on 04-13-14. She wants to know if he can stop his Coumadin for 4 days. Please call and give her a verbal okay.

## 2014-04-07 ENCOUNTER — Telehealth: Payer: Self-pay | Admitting: Cardiology

## 2014-04-07 NOTE — Telephone Encounter (Signed)
Pt encouraged to call Hilda Blades on Monday if he needs an earlier appt.

## 2014-04-07 NOTE — Telephone Encounter (Signed)
Returning Debra call from yesterday. °

## 2014-06-12 ENCOUNTER — Other Ambulatory Visit: Payer: Self-pay | Admitting: Cardiology

## 2014-08-29 ENCOUNTER — Ambulatory Visit (INDEPENDENT_AMBULATORY_CARE_PROVIDER_SITE_OTHER): Payer: Self-pay | Admitting: *Deleted

## 2014-08-29 DIAGNOSIS — Z5181 Encounter for therapeutic drug level monitoring: Secondary | ICD-10-CM

## 2014-08-29 DIAGNOSIS — I4891 Unspecified atrial fibrillation: Secondary | ICD-10-CM

## 2014-08-29 LAB — POCT INR: INR: 1

## 2014-08-29 MED ORDER — WARFARIN SODIUM 5 MG PO TABS: ORAL_TABLET | ORAL | Status: DC

## 2014-09-06 ENCOUNTER — Ambulatory Visit (INDEPENDENT_AMBULATORY_CARE_PROVIDER_SITE_OTHER): Payer: BLUE CROSS/BLUE SHIELD | Admitting: *Deleted

## 2014-09-06 DIAGNOSIS — Z5181 Encounter for therapeutic drug level monitoring: Secondary | ICD-10-CM

## 2014-09-06 DIAGNOSIS — I4891 Unspecified atrial fibrillation: Secondary | ICD-10-CM

## 2014-09-06 LAB — POCT INR: INR: 2

## 2014-09-06 MED ORDER — WARFARIN SODIUM 5 MG PO TABS: ORAL_TABLET | ORAL | Status: DC

## 2014-09-15 ENCOUNTER — Other Ambulatory Visit: Payer: Self-pay

## 2014-09-15 MED ORDER — WARFARIN SODIUM 5 MG PO TABS: ORAL_TABLET | ORAL | Status: DC

## 2014-09-15 NOTE — Telephone Encounter (Signed)
Spoke with pt, pt [place] and has appt on 09/20/14, needs a 1 week supply of Warfarin sent to local pharmacy so he doesn't run out while in town.

## 2014-09-20 ENCOUNTER — Ambulatory Visit (INDEPENDENT_AMBULATORY_CARE_PROVIDER_SITE_OTHER): Payer: BLUE CROSS/BLUE SHIELD | Admitting: *Deleted

## 2014-09-20 DIAGNOSIS — I4891 Unspecified atrial fibrillation: Secondary | ICD-10-CM

## 2014-09-20 DIAGNOSIS — Z5181 Encounter for therapeutic drug level monitoring: Secondary | ICD-10-CM

## 2014-09-20 LAB — POCT INR: INR: 1.7

## 2014-09-20 MED ORDER — WARFARIN SODIUM 5 MG PO TABS: ORAL_TABLET | ORAL | Status: DC

## 2015-02-17 ENCOUNTER — Other Ambulatory Visit: Payer: Self-pay | Admitting: Cardiology

## 2015-03-27 ENCOUNTER — Other Ambulatory Visit: Payer: Self-pay | Admitting: Pharmacist Clinician (PhC)/ Clinical Pharmacy Specialist

## 2015-03-27 MED ORDER — WARFARIN SODIUM 5 MG PO TABS: ORAL_TABLET | ORAL | Status: DC

## 2015-05-04 ENCOUNTER — Ambulatory Visit (INDEPENDENT_AMBULATORY_CARE_PROVIDER_SITE_OTHER): Payer: BLUE CROSS/BLUE SHIELD | Admitting: Pharmacist

## 2015-05-04 DIAGNOSIS — I4891 Unspecified atrial fibrillation: Secondary | ICD-10-CM | POA: Diagnosis not present

## 2015-05-04 DIAGNOSIS — Z5181 Encounter for therapeutic drug level monitoring: Secondary | ICD-10-CM | POA: Diagnosis not present

## 2015-05-04 LAB — POCT INR: INR: 1

## 2015-05-04 MED ORDER — WARFARIN SODIUM 5 MG PO TABS: ORAL_TABLET | ORAL | Status: DC

## 2015-05-06 ENCOUNTER — Other Ambulatory Visit: Payer: Self-pay | Admitting: Cardiology

## 2015-09-13 ENCOUNTER — Telehealth: Payer: Self-pay | Admitting: Pharmacist

## 2015-09-13 NOTE — Telephone Encounter (Signed)
LMOM - pt has very poor compliance with Coumadin visits and has not been seen in almost 5 months. Advised him to call to set up appt in clinic for INR check.

## 2015-09-25 ENCOUNTER — Other Ambulatory Visit: Payer: Self-pay | Admitting: Family Medicine

## 2015-09-25 DIAGNOSIS — M545 Low back pain: Secondary | ICD-10-CM

## 2015-10-11 ENCOUNTER — Ambulatory Visit (INDEPENDENT_AMBULATORY_CARE_PROVIDER_SITE_OTHER): Payer: BLUE CROSS/BLUE SHIELD | Admitting: *Deleted

## 2015-10-11 DIAGNOSIS — Z5181 Encounter for therapeutic drug level monitoring: Secondary | ICD-10-CM

## 2015-10-11 DIAGNOSIS — I4891 Unspecified atrial fibrillation: Secondary | ICD-10-CM | POA: Diagnosis not present

## 2015-10-11 LAB — POCT INR: INR: 1

## 2015-10-11 MED ORDER — WARFARIN SODIUM 5 MG PO TABS: ORAL_TABLET | ORAL | Status: DC

## 2015-10-19 ENCOUNTER — Ambulatory Visit (INDEPENDENT_AMBULATORY_CARE_PROVIDER_SITE_OTHER): Payer: BLUE CROSS/BLUE SHIELD | Admitting: *Deleted

## 2015-10-19 DIAGNOSIS — Z5181 Encounter for therapeutic drug level monitoring: Secondary | ICD-10-CM

## 2015-10-19 DIAGNOSIS — I4891 Unspecified atrial fibrillation: Secondary | ICD-10-CM | POA: Diagnosis not present

## 2015-10-19 LAB — POCT INR: INR: 2.5

## 2015-10-19 MED ORDER — WARFARIN SODIUM 5 MG PO TABS: ORAL_TABLET | ORAL | Status: DC

## 2015-10-30 ENCOUNTER — Other Ambulatory Visit: Payer: BLUE CROSS/BLUE SHIELD

## 2015-11-07 NOTE — Progress Notes (Signed)
HPI: FU paroxysmal atrial fibrillation returns for followup. The patient had a cardiac catheterization in 2004 that showed an ejection fraction of 45% and normal coronary arteries. He did have atrial fibrillation at that time and his LV function improved after sinus rhythm was restored. He ultimately had atrial fibrillation ablation. He did well for several years. However in August of 2010 he was admitted to [facility] with recurrent atrial fibrillation. TSH was normal. An echocardiogram performed on August 11 of 2010 showed an ejection fraction of 35-40%. There was biatrial enlargement. There was at least moderate mitral regurgitation. The patient subsequently had a transesophageal echocardiogram that showed an ejection fraction of 35% and moderate mitral regurgitation. He underwent cardioversion at that time as there was no left atrial appendage thrombus. He was seen in followup by Dr. Rayann Heman and close followup was felt to be indicated. Possible attempt at another ablation could be considered in the future if his atrial fibrillation recurred. Repeat echo in Dec 2012 showed an EF of 60-65 and grade 2 diastolic dysfunction. Patient had last episode of atrial fibrillation in Jan 2013 and required DCCV 2/13. Since last seen, He had a syncopal episode while unloading a truck in July 2016. He had multiple traumas. There was no preceding palpitations, dyspnea, chest pain or nausea. He has had no issues since. There is no dyspnea on exertion, orthopnea, PND, pedal edema or exertional chest pain.  Current Outpatient Prescriptions  Medication Sig Dispense Refill  . acetaminophen (TYLENOL) 325 MG tablet Take 650 mg by mouth every 6 (six) hours as needed. For pain    . diltiazem (TAZTIA XT) 360 MG 24 hr capsule Take 360 mg by mouth every morning.    . DULoxetine (CYMBALTA) 30 MG capsule Take 30 mg by mouth daily.      Marland Kitchen lisinopril (PRINIVIL,ZESTRIL) 40 MG tablet Take 1 tablet (40 mg total) by mouth  daily. 30 tablet 12  . montelukast (SINGULAIR) 10 MG tablet Take 10 mg by mouth at bedtime as needed. Allergies.    . sodium chloride (OCEAN) 0.65 % nasal spray Place 1 spray into the nose as needed for congestion.    . traMADol (ULTRAM) 50 MG tablet Take 100-200 mg by mouth every 8 (eight) hours as needed for pain.    Marland Kitchen warfarin (COUMADIN) 5 MG tablet TAKE AS DIRECTED BY COUMADIN CLINIC 40 tablet 0   No current facility-administered medications for this visit.     Past Medical History  Diagnosis Date  . HLD (hyperlipidemia)   . Depression   . Allergic rhinitis   . NICM (nonischemic cardiomyopathy) ([place])   . Narcotic addiction ([place])   . Inguinal hernia without mention of obstruction or gangrene, unilateral or unspecified, (not specified as recurrent)   . Hypertrophy of prostate with urinary obstruction and other lower urinary tract symptoms (LUTS)   . Complication of anesthesia     "hard to put asleep, hard to wake up, nausea and vomiting"  . PONV (postoperative nausea and vomiting)   . PAF (paroxysmal atrial fibrillation) ([place])     ablation done 03/2006, sees Dr. Ginger Organ, [facility]  . HTN (hypertension)     sees Dr. [name], [facility]  . Asthma     as a child  . Pneumonia     hx of 2004  . Lumbar herniated disc     L7    Past Surgical History  Procedure Laterality Date  . Atrial ablation surgery  2007    [facility]  . Cardioversion  08/11/2011    Procedure: CARDIOVERSION;  Surgeon: Lelon Perla, MD;  Location: [place];  Service: Cardiovascular;  Laterality: N/A;  . Hernia repair      1977  . Eye surgery      lasik, 1998  . Inguinal hernia repair Right 08/26/2012    Procedure: LAPAROSCOPIC INGUINAL HERNIA;  Surgeon: Madilyn Hook, DO;  [facility];  Service: General;  Laterality: Right;  laparoscopic right inguinal hernia repair with mesh, umbilical hernia repair  . Insertion of mesh Right 08/26/2012    Procedure: INSERTION OF MESH;  Surgeon: Madilyn Hook,  DO;  Location: [place];  Service: General;  Laterality: Right;  right inguinal hernia  . Umbilical hernia repair N/A 08/26/2012    Procedure: HERNIA REPAIR UMBILICAL ADULT;  Surgeon: Madilyn Hook, DO;  Location: [place];  Service: General;  Laterality: N/A;    Social History   Social History  . Marital Status: Married    Spouse Name: N/A  . Number of Children: N/A  . Years of Education: N/A   Occupational History  . owns landscaping business    Social History Main Topics  . Smoking status: Never Smoker   . Smokeless tobacco: Not on file  . Alcohol Use: No  . Drug Use: No  . Sexual Activity: Yes   Other Topics Concern  . Not on file   Social History Narrative    Family History  Problem Relation Age of Onset  . Asthma Mother   . Breast cancer Mother   . COPD Father   . Lymphoma Sister     ROS: no fevers or chills, productive cough, hemoptysis, dysphasia, odynophagia, melena, hematochezia, dysuria, hematuria, rash, seizure activity, orthopnea, PND, pedal edema, claudication. Remaining systems are negative.  Physical Exam: Well-developed well-nourished in no acute distress.  Skin is warm and dry.  HEENT is normal.  Neck is supple.  Chest is clear to auscultation with normal expansion.  Cardiovascular exam is regular rate and rhythm.  Abdominal exam nontender or distended. No masses palpated. Extremities show no edema. neuro grossly intact  ECG Normal sinus rhythm at a rate of 63. RV conduction delay.

## 2015-11-15 ENCOUNTER — Ambulatory Visit (INDEPENDENT_AMBULATORY_CARE_PROVIDER_SITE_OTHER): Payer: BLUE CROSS/BLUE SHIELD | Admitting: Cardiology

## 2015-11-15 ENCOUNTER — Encounter: Payer: Self-pay | Admitting: Cardiology

## 2015-11-15 VITALS — BP 132/70 | HR 63 | Ht 77.0 in | Wt 288.0 lb

## 2015-11-15 DIAGNOSIS — I480 Paroxysmal atrial fibrillation: Secondary | ICD-10-CM | POA: Diagnosis not present

## 2015-11-15 DIAGNOSIS — I10 Essential (primary) hypertension: Secondary | ICD-10-CM | POA: Diagnosis not present

## 2015-11-15 DIAGNOSIS — R55 Syncope and collapse: Secondary | ICD-10-CM | POA: Diagnosis not present

## 2015-11-15 LAB — CBC
HEMATOCRIT: 42.3 % (ref 38.5–50.0)
HEMOGLOBIN: 14.2 g/dL (ref 13.2–17.1)
MCH: 30.3 pg (ref 27.0–33.0)
MCHC: 33.6 g/dL (ref 32.0–36.0)
MCV: 90.4 fL (ref 80.0–100.0)
MPV: 9.9 fL (ref 7.5–12.5)
Platelets: 276 10*3/uL (ref 140–400)
RBC: 4.68 MIL/uL (ref 4.20–5.80)
RDW: 13.5 % (ref 11.0–15.0)
WBC: 6.5 10*3/uL (ref 3.8–10.8)

## 2015-11-15 LAB — BASIC METABOLIC PANEL
BUN: 21 mg/dL (ref 7–25)
CO2: 25 mmol/L (ref 20–31)
Calcium: 8.7 mg/dL (ref 8.6–10.3)
Chloride: 107 mmol/L (ref 98–110)
Creat: 1.1 mg/dL (ref 0.70–1.33)
GLUCOSE: 95 mg/dL (ref 65–99)
POTASSIUM: 4.2 mmol/L (ref 3.5–5.3)
Sodium: 141 mmol/L (ref 135–146)

## 2015-11-15 NOTE — Assessment & Plan Note (Signed)
Previously felt secondary to tachycardia. Improved on most recent echocardiogram. Plan repeat study.

## 2015-11-15 NOTE — Assessment & Plan Note (Addendum)
Blood pressure controlled. Continue present medications. Check potassium and renal function. 

## 2015-11-15 NOTE — Patient Instructions (Signed)
Medication Instructions:   NO CHANGE  Labwork:  Your physician recommends that you HAVE LAB WORK TODAY  Testing/Procedures:  Your physician has requested that you have an echocardiogram. Echocardiography is a painless test that uses sound waves to create images of your heart. It provides your doctor with information about the size and shape of your heart and how well your heart's chambers and valves are working. This procedure takes approximately one hour. There are no restrictions for this procedure.    Follow-Up:  Your physician wants you to follow-up in: ONE YEAR WITH DR CRENSHAW You will receive a reminder letter in the mail two months in advance. If you don't receive a letter, please call our office to schedule the follow-up appointment.   If you need a refill on your cardiac medications before your next appointment, please call your pharmacy.    

## 2015-11-15 NOTE — Assessment & Plan Note (Addendum)
Patient remains in sinus rhythm. Continue Cardizem. Continue Coumadin. He wants to remain on Coumadin because of cost of NOACs. Check hemoglobin.

## 2015-11-15 NOTE — Assessment & Plan Note (Signed)
Etiology unclear. He has had no recurrent episodes since July 2016. Schedule echocardiogram to assess LV function. May need implantable loop if he has recurrent episodes in the future.

## 2015-11-16 ENCOUNTER — Telehealth: Payer: Self-pay | Admitting: *Deleted

## 2015-11-16 NOTE — Telephone Encounter (Signed)
Left message to call back in regards to labs CBC,BMP

## 2015-11-16 NOTE — Telephone Encounter (Signed)
-----   Message from Lelon Perla, MD sent at 11/16/2015  5:47 AM EDT ----- [facility]

## 2015-11-29 ENCOUNTER — Other Ambulatory Visit (HOSPITAL_COMMUNITY): Payer: BLUE CROSS/BLUE SHIELD

## 2016-01-02 ENCOUNTER — Encounter (HOSPITAL_BASED_OUTPATIENT_CLINIC_OR_DEPARTMENT_OTHER): Payer: Self-pay

## 2016-01-02 ENCOUNTER — Emergency Department (HOSPITAL_BASED_OUTPATIENT_CLINIC_OR_DEPARTMENT_OTHER)
Admission: EM | Admit: 2016-01-02 | Discharge: 2016-01-03 | Disposition: A | Discharge status: Home / Self Care | Payer: BLUE CROSS/BLUE SHIELD | Attending: Emergency Medicine | Admitting: Emergency Medicine

## 2016-01-02 DIAGNOSIS — Y999 Unspecified external cause status: Secondary | ICD-10-CM | POA: Insufficient documentation

## 2016-01-02 DIAGNOSIS — I10 Essential (primary) hypertension: Secondary | ICD-10-CM | POA: Diagnosis not present

## 2016-01-02 DIAGNOSIS — E785 Hyperlipidemia, unspecified: Secondary | ICD-10-CM | POA: Diagnosis not present

## 2016-01-02 DIAGNOSIS — Z79899 Other long term (current) drug therapy: Secondary | ICD-10-CM | POA: Diagnosis not present

## 2016-01-02 DIAGNOSIS — J45909 Unspecified asthma, uncomplicated: Secondary | ICD-10-CM | POA: Diagnosis not present

## 2016-01-02 DIAGNOSIS — I480 Paroxysmal atrial fibrillation: Secondary | ICD-10-CM | POA: Diagnosis not present

## 2016-01-02 DIAGNOSIS — Y929 Unspecified place or not applicable: Secondary | ICD-10-CM | POA: Diagnosis not present

## 2016-01-02 DIAGNOSIS — F329 Major depressive disorder, single episode, unspecified: Secondary | ICD-10-CM | POA: Diagnosis not present

## 2016-01-02 DIAGNOSIS — S01512A Laceration without foreign body of oral cavity, initial encounter: Secondary | ICD-10-CM | POA: Diagnosis present

## 2016-01-02 DIAGNOSIS — Y939 Activity, unspecified: Secondary | ICD-10-CM | POA: Diagnosis not present

## 2016-01-02 DIAGNOSIS — X58XXXA Exposure to other specified factors, initial encounter: Secondary | ICD-10-CM | POA: Diagnosis not present

## 2016-01-02 NOTE — ED Notes (Signed)
Pt states he bit his tongue approx 8pm and bleeding will not stop-pt is on coumadin-small lac noted to tip of tongue-cont'd slight bleeding noted-NAD

## 2016-01-02 NOTE — ED Notes (Signed)
Lac cont'd to ooze-pt given ice and gauze and advised to keep ice filled gauze in place-not off/on

## 2016-01-02 NOTE — ED Provider Notes (Addendum)
CSN: [id number]     Arrival date & time 01/02/16  2208 History  By signing my name below, I, Soijett Blue, attest that this documentation has been prepared under the direction and in the presence of Merryl Hacker, MD. Electronically Signed: [place], ED Scribe. 01/02/2016. 11:56 PM.   Chief Complaint  Patient presents with  . Tongue laceration      The history is provided by the patient. No language interpreter was used.    [name] is a 60 y.o. male with a medical hx of PAF who presents to the Emergency Department complaining of tongue laceration onset 7 PM tonight PTA. Pt reports that he bit his tongue and he has had active bleeding since. Pt denies falling at the time of the incident. Pt notes that he is on coumadin for A-fib and has been on it since 2007. Pt last had his INR checked 2 weeks ago and it was 2.7. Denies any other recent incidents. He states that he has tried applying pressure for the relief for his symptoms but it continued to ooze. He denies any other symptoms. Denies smoking cigarettes or drinking.    Past Medical History  Diagnosis Date  . HLD (hyperlipidemia)   . Depression   . Allergic rhinitis   . NICM (nonischemic cardiomyopathy) ([place])   . Narcotic addiction ([place])   . Inguinal hernia without mention of obstruction or gangrene, unilateral or unspecified, (not specified as recurrent)   . Hypertrophy of prostate with urinary obstruction and other lower urinary tract symptoms (LUTS)   . Complication of anesthesia     "hard to put asleep, hard to wake up, nausea and vomiting"  . PONV (postoperative nausea and vomiting)   . PAF (paroxysmal atrial fibrillation) ([place])     ablation done 03/2006, sees Dr. Ginger Organ, [facility]  . HTN (hypertension)     sees Dr. [name], [facility]  . Asthma     as a child  . Pneumonia     hx of 2004  . Lumbar herniated disc     L7   Past Surgical History  Procedure Laterality Date  . Atrial  ablation surgery  2007    [facility]  . Cardioversion  08/11/2011    Procedure: CARDIOVERSION;  Surgeon: Lelon Perla, MD;  Location: [place];  Service: Cardiovascular;  Laterality: N/A;  . Hernia repair      1977  . Eye surgery      lasik, 1998  . Inguinal hernia repair Right 08/26/2012    Procedure: LAPAROSCOPIC INGUINAL HERNIA;  Surgeon: Madilyn Hook, DO;  [facility];  Service: General;  Laterality: Right;  laparoscopic right inguinal hernia repair with mesh, umbilical hernia repair  . Insertion of mesh Right 08/26/2012    Procedure: INSERTION OF MESH;  Surgeon: Madilyn Hook, DO;  Location: [place];  Service: General;  Laterality: Right;  right inguinal hernia  . Umbilical hernia repair N/A 08/26/2012    Procedure: HERNIA REPAIR UMBILICAL ADULT;  Surgeon: Madilyn Hook, DO;  [facility];  Service: General;  Laterality: N/A;   Family History  Problem Relation Age of Onset  . Asthma Mother   . Breast cancer Mother   . COPD Father   . Lymphoma Sister    Social History  Substance Use Topics  . Smoking status: Never Smoker   . Smokeless tobacco: None  . Alcohol Use: No    Review of Systems  Constitutional: Negative for fever and chills.  HENT:       Tongue laceration  All other systems reviewed and are negative.   Allergies  Review of patient's allergies indicates no known allergies.  Home Medications   Prior to Admission medications   Medication Sig Start Date End Date Taking? Authorizing Provider  acetaminophen (TYLENOL) 325 MG tablet Take 650 mg by mouth every 6 (six) hours as needed. For pain    Historical Provider, MD  diltiazem (TAZTIA XT) 360 MG 24 hr capsule Take 360 mg by mouth every morning.    Historical Provider, MD  DULoxetine (CYMBALTA) 30 MG capsule Take 30 mg by mouth daily.      Historical Provider, MD  lisinopril (PRINIVIL,ZESTRIL) 40 MG tablet Take 1 tablet (40 mg total) by mouth daily. 03/31/12   Lelon Perla, MD  montelukast (SINGULAIR) 10 MG tablet  Take 10 mg by mouth at bedtime as needed. Allergies.    Historical Provider, MD  sodium chloride (OCEAN) 0.65 % nasal spray Place 1 spray into the nose as needed for congestion.    Historical Provider, MD  traMADol (ULTRAM) 50 MG tablet Take 100-200 mg by mouth every 8 (eight) hours as needed for pain.    Historical Provider, MD  warfarin (COUMADIN) 5 MG tablet TAKE AS DIRECTED BY COUMADIN CLINIC 10/19/15   Lelon Perla, MD   BP 156/116 mmHg  Pulse 94  Temp(Src) 98.8 F (37.1 C) (Oral)  Resp 18  Ht 6\' 5"  (1.956 m)  Wt 270 lb (122.471 kg)  BMI 32.01 kg/m2  SpO2 99% Physical Exam  Constitutional: He appears well-developed and well-nourished. No distress.  HENT:  Head: Normocephalic and atraumatic.  Less than 0.5 cm puncture wound over the right anterior tongue with adjacent bruising, no active bleeding noted  Cardiovascular: Normal rate and regular rhythm.   Pulmonary/Chest: Effort normal. No respiratory distress.  Neurological: He is alert.  Skin: Skin is warm and dry.  Psychiatric: He has a normal mood and affect.  Nursing note and vitals reviewed.   ED Course  Procedures (including critical care time) DIAGNOSTIC STUDIES: Oxygen Saturation is 99% on RA, nl by my interpretation.    COORDINATION OF CARE: 11:55 PM Discussed treatment plan with pt at bedside which includes labs and pt agreed to plan.    Labs Review Labs Reviewed  PROTIME-INR   I have personally reviewed and evaluated these lab results as part of my medical decision-making.   MDM   Final diagnoses:  Tongue laceration, initial encounter    Patient presents with an isolated tongue laceration. Event occurred at 7 PM. He reports continued oozing likely secondary to Coumadin use. No active bleeding noted on exam. Patient and his wife expressed frustration regarding their wait, which I apologized for.  Patient states that multiple times the bleed appears to have stopped but then recurs. For this reason, I  have inquired with nursing regarding available thrombin inhibitors at [facility]. We do not have any TXA or thrombin powder available per nursing.  PT/INR ordered.  Given that there is no active bleeding, do not feel he needs an injection with lidocaine w epinephrine as this will create an additional puncture wound that he may bleed from.   The patient and his wife express great frustration regarding their wait time as well as no available "treatments." I have expressed to them that if he was actively bleeding I could inject the site or transfer to [facility] for thrombin products. At this time given he  has no active bleeding, I do not feel he needs further intervention and have provided him with ice water to swish and spit while waiting on his PT/INR.  They did not seem happy with this explanation. I apologized multiple times. They reported that they wanted to leave and did not sign discharge paperwork.   Patient was appropriately medically screened. He had no active bleeding. He was hemodynamically stable.  After history, exam, and medical workup I feel the patient has been appropriately medically screened and is safe for discharge home. Pertinent diagnoses were discussed with the patient. Patient was given return precautions.  I personally performed the services described in this documentation, which was scribed in my presence. The recorded information has been reviewed and is accurate.    Merryl Hacker, MD 01/03/16 [id number]  Merryl Hacker, MD 01/03/16 [id number]

## 2016-01-03 LAB — PROTIME-INR
INR: 1.82 — AB (ref 0.00–1.49)
PROTHROMBIN TIME: 21 s — AB (ref 11.6–15.2)

## 2016-01-03 MED ORDER — GELATIN ABSORBABLE 12-7 MM EX MISC
1.0000 | Freq: Once | CUTANEOUS | Status: DC
  Filled 2016-01-03: qty 1

## 2016-01-03 NOTE — Discharge Instructions (Signed)
You were seen today for a minor tongue laceration. You are on Coumadin and experienced some bleeding at home. Bleeding stopped on my evaluation. Your PT/INR is pending. I will call you if this is supratherapeutic. If you have recurrent profuse bleeding, you should be reevaluated. [facility] has the most resources for this type of injury.  Tongue Laceration A tongue laceration is a cut on the tongue. Although this type of wound may look severe, most tongue lacerations heal without any problems. Treatment for tongue laceration varies depending on the severity of the cut.  Minor tongue lacerations that extend only part of the way through the tongue usually do not need stitches (sutures).  A more severe laceration may require sutures and follow-up care if:  It extends all the way through the tongue.  It occurs along the side of the tongue.  Severe lacerations may be treated with antibiotic medicines to prevent infection. HOME CARE INSTRUCTIONS Pay attention to any changes in your wound. Take these actions to help with your healing.  Take over-the-counter and prescription medicines only as told by your health care provider.  If you were prescribed an antibiotic medicine, take it as told by your health care provider. Do not stop taking it even if you start to feel better.  If you were prescribed a germ-killing (antiseptic) mouth rinse, use it exactly as told.  If your laceration was closed with sutures, do not pull on them. Leave the sutures in place for as long as told by your health care provider. Follow instructions from your health care provider about:  How to care for your sutures.  When and how your sutures will need to be removed, if this applies.  Rinse your mouth with water after each time that you eat.  If directed, apply ice to the injured area.  Cover an ice cube with a thin cloth.  Hold the covered ice cube directly on the cut for 1-3 minutes, as tolerated. Repeat this 4  times per day, for 1-2 days.  After one day, use salt-water or antiseptic mouth rinses 4-6 times per day as told by your health care provider. To make a salt-water mixture, completely dissolve -1 tsp of salt in 1 cup of warm water.  If your teeth were not injured, continue with gentle brushing and flossing.  Do not brush or floss loose or broken teeth.  Do not brush or floss teeth that have been put back into normal position by your health care provider.  Follow instructions from your health care provider about eating and drinking restrictions. Do not eat hard or chewy foods until your injury has healed. Your health care provider may recommend a liquid diet.  Do not eat hot food or drink hot beverages while your mouth is numb.  Keep all follow-up visits as told by your health care provider. This is important. SEEK MEDICAL CARE IF:  You have a fever.  You have pus coming from your wound.  A wound that was closed breaks open. SEEK IMMEDIATE MEDICAL CARE IF:  You develop bleeding that does not stop when you apply pressure.  You develop breathing problems.  You develop increasing swelling or pain in your wound or in other parts of your mouth, neck, or face.   This information is not intended to replace advice given to you by your health care provider. Make sure you discuss any questions you have with your health care provider.   Document Released: 06/23/2006 Document Revised: 03/14/2015 Document Reviewed:  08/30/2014 Elsevier Interactive Patient Education [employer].

## 2016-01-03 NOTE — ED Notes (Signed)
Pt very unhappy with care, states we did nothing for him; EDP and myself spoke with pt, bleeding controlled at this time; pt left prior to receiving d/c papers and refused to sign at registration, pt's wife states "we didn't receive what we wanted and we aren't signing anything."

## 2016-02-14 ENCOUNTER — Other Ambulatory Visit: Payer: Self-pay | Admitting: Cardiology

## 2016-02-25 ENCOUNTER — Other Ambulatory Visit: Payer: Self-pay | Admitting: *Deleted

## 2016-05-02 ENCOUNTER — Ambulatory Visit (INDEPENDENT_AMBULATORY_CARE_PROVIDER_SITE_OTHER): Payer: BLUE CROSS/BLUE SHIELD | Admitting: *Deleted

## 2016-05-02 DIAGNOSIS — Z5181 Encounter for therapeutic drug level monitoring: Secondary | ICD-10-CM | POA: Diagnosis not present

## 2016-05-02 DIAGNOSIS — I4891 Unspecified atrial fibrillation: Secondary | ICD-10-CM | POA: Diagnosis not present

## 2016-05-02 LAB — POCT INR: INR: 1

## 2016-05-02 MED ORDER — WARFARIN SODIUM 5 MG PO TABS: ORAL_TABLET | ORAL | 0 refills | Status: DC

## 2016-06-16 ENCOUNTER — Other Ambulatory Visit: Payer: Self-pay | Admitting: *Deleted

## 2016-06-20 ENCOUNTER — Ambulatory Visit (INDEPENDENT_AMBULATORY_CARE_PROVIDER_SITE_OTHER): Payer: BLUE CROSS/BLUE SHIELD

## 2016-06-20 DIAGNOSIS — Z5181 Encounter for therapeutic drug level monitoring: Secondary | ICD-10-CM

## 2016-06-20 DIAGNOSIS — I4891 Unspecified atrial fibrillation: Secondary | ICD-10-CM | POA: Diagnosis not present

## 2016-06-20 LAB — POCT INR: INR: 1

## 2016-06-20 MED ORDER — WARFARIN SODIUM 5 MG PO TABS: ORAL_TABLET | ORAL | 0 refills | Status: DC

## 2016-06-23 ENCOUNTER — Other Ambulatory Visit: Payer: Self-pay | Admitting: Pharmacist Clinician (PhC)/ Clinical Pharmacy Specialist

## 2016-06-23 MED ORDER — WARFARIN SODIUM 5 MG PO TABS: ORAL_TABLET | ORAL | 0 refills | Status: DC

## 2016-09-03 ENCOUNTER — Ambulatory Visit (INDEPENDENT_AMBULATORY_CARE_PROVIDER_SITE_OTHER): Payer: 59 | Admitting: *Deleted

## 2016-09-03 DIAGNOSIS — Z5181 Encounter for therapeutic drug level monitoring: Secondary | ICD-10-CM

## 2016-09-03 DIAGNOSIS — I4891 Unspecified atrial fibrillation: Secondary | ICD-10-CM | POA: Diagnosis not present

## 2016-09-03 LAB — POCT INR: INR: 1.1

## 2016-09-03 MED ORDER — WARFARIN SODIUM 5 MG PO TABS: ORAL_TABLET | ORAL | 0 refills | Status: DC

## 2016-09-04 DIAGNOSIS — N390 Urinary tract infection, site not specified: Secondary | ICD-10-CM | POA: Diagnosis not present

## 2016-09-04 DIAGNOSIS — I10 Essential (primary) hypertension: Secondary | ICD-10-CM | POA: Diagnosis not present

## 2016-09-04 DIAGNOSIS — K219 Gastro-esophageal reflux disease without esophagitis: Secondary | ICD-10-CM | POA: Diagnosis not present

## 2016-09-04 DIAGNOSIS — E782 Mixed hyperlipidemia: Secondary | ICD-10-CM | POA: Diagnosis not present

## 2016-09-11 DIAGNOSIS — I10 Essential (primary) hypertension: Secondary | ICD-10-CM | POA: Diagnosis not present

## 2016-09-11 DIAGNOSIS — E782 Mixed hyperlipidemia: Secondary | ICD-10-CM | POA: Diagnosis not present

## 2016-10-08 ENCOUNTER — Encounter (INDEPENDENT_AMBULATORY_CARE_PROVIDER_SITE_OTHER): Payer: Self-pay

## 2016-10-08 ENCOUNTER — Ambulatory Visit (INDEPENDENT_AMBULATORY_CARE_PROVIDER_SITE_OTHER): Payer: 59 | Admitting: *Deleted

## 2016-10-08 DIAGNOSIS — I4891 Unspecified atrial fibrillation: Secondary | ICD-10-CM

## 2016-10-08 DIAGNOSIS — Z5181 Encounter for therapeutic drug level monitoring: Secondary | ICD-10-CM | POA: Diagnosis not present

## 2016-10-08 LAB — POCT INR: INR: 2.3

## 2016-10-08 MED ORDER — WARFARIN SODIUM 5 MG PO TABS: ORAL_TABLET | ORAL | 2 refills | Status: DC

## 2016-12-13 ENCOUNTER — Emergency Department (HOSPITAL_COMMUNITY)
Admission: EM | Admit: 2016-12-13 | Discharge: 2016-12-13 | Disposition: A | Discharge status: Home / Self Care | Payer: 59 | Attending: Emergency Medicine | Admitting: Emergency Medicine

## 2016-12-13 ENCOUNTER — Encounter (HOSPITAL_COMMUNITY): Payer: Self-pay | Admitting: *Deleted

## 2016-12-13 DIAGNOSIS — Y990 Civilian activity done for income or pay: Secondary | ICD-10-CM | POA: Insufficient documentation

## 2016-12-13 DIAGNOSIS — I10 Essential (primary) hypertension: Secondary | ICD-10-CM | POA: Insufficient documentation

## 2016-12-13 DIAGNOSIS — Y939 Activity, unspecified: Secondary | ICD-10-CM | POA: Insufficient documentation

## 2016-12-13 DIAGNOSIS — Y929 Unspecified place or not applicable: Secondary | ICD-10-CM | POA: Insufficient documentation

## 2016-12-13 DIAGNOSIS — M5442 Lumbago with sciatica, left side: Secondary | ICD-10-CM | POA: Insufficient documentation

## 2016-12-13 DIAGNOSIS — Z7901 Long term (current) use of anticoagulants: Secondary | ICD-10-CM | POA: Diagnosis not present

## 2016-12-13 DIAGNOSIS — M549 Dorsalgia, unspecified: Secondary | ICD-10-CM | POA: Diagnosis present

## 2016-12-13 DIAGNOSIS — G8929 Other chronic pain: Secondary | ICD-10-CM | POA: Diagnosis not present

## 2016-12-13 DIAGNOSIS — J45909 Unspecified asthma, uncomplicated: Secondary | ICD-10-CM | POA: Diagnosis not present

## 2016-12-13 DIAGNOSIS — X509XXA Other and unspecified overexertion or strenuous movements or postures, initial encounter: Secondary | ICD-10-CM | POA: Insufficient documentation

## 2016-12-13 MED ORDER — PREDNISONE 20 MG PO TABS: 40.0000 mg | ORAL_TABLET | Freq: Every day | ORAL | 0 refills | Status: DC

## 2016-12-13 MED ORDER — TRAMADOL HCL 50 MG PO TABS: 100.0000 mg | ORAL_TABLET | Freq: Three times a day (TID) | ORAL | 0 refills | Status: DC | PRN

## 2016-12-13 NOTE — ED Triage Notes (Signed)
Pt reports hx of chronic back pain but recently re-injured his lower back at work. Pt taking tramadol at home but with no relief and is out of his prescription until Monday.

## 2016-12-13 NOTE — Discharge Instructions (Signed)
Continue Ibuprofen, Tylenol, muscle relaxer, and heating pads Take Tramadol as needed Take steroid once daily Follow up with PCP/Ortho

## 2016-12-14 NOTE — ED Provider Notes (Signed)
[facility] DEPT Provider Note   CSN: [id number] Arrival date & time: 12/13/16  1329     History   Chief Complaint Chief Complaint  Patient presents with  . Back Pain    HPI [name] is a 61 y.o. male who presents with right-sided back pain. Past medical history for lumbar herniated disc and chronic pain. He is prescribed tramadol which he takes daily. He states 2 weeks ago he pulled his back at work. He has been taking ibuprofen, Tylenol, using heating pads and ice, and taking a muscle relaxer and his tramadol with good relief. He has been having to take extra tramadol due to the increased pain and ran out several days ago. No fever, syncope, trauma, unexplained weight loss, hx of cancer, loss of bowel/bladder function, saddle anesthesia, urinary retention, IVDU. He went to an urgent care and is upset because the provider "treated him like a addict" and would not give him any more tramadol. He states he has a refill on the tramadol on Monday. He has been able to walk and denies any leg weakness.  HPI  Past Medical History:  Diagnosis Date  . Allergic rhinitis   . Asthma    as a child  . Complication of anesthesia    "hard to put asleep, hard to wake up, nausea and vomiting"  . Depression   . HLD (hyperlipidemia)   . HTN (hypertension)    sees Dr. [name], [facility]  . Hypertrophy of prostate with urinary obstruction and other lower urinary tract symptoms (LUTS)   . Inguinal hernia without mention of obstruction or gangrene, unilateral or unspecified, (not specified as recurrent)   . Lumbar herniated disc    L7  . Narcotic addiction ([place])   . NICM (nonischemic cardiomyopathy) ([place])   . PAF (paroxysmal atrial fibrillation) ([place])    ablation done 03/2006, sees Dr. Ginger Organ, [facility]  . Pneumonia    hx of 2004  . PONV (postoperative nausea and vomiting)     Patient Active Problem List   Diagnosis Date Noted  . Syncope 11/15/2015  . Encounter for  therapeutic drug monitoring 08/02/2013  . Concussion 01/13/2013  . Fall 01/13/2013  . Multiple fractures of ribs of left side 01/13/2013  . Traumatic pneumothorax 01/13/2013  . Long term current use of anticoagulant 09/18/2010  . MITRAL INSUFFICIENCY 03/14/2009  . CARDIOMYOPATHY 03/14/2009  . DEPRESSION 02/13/2009  . ALLERGIC RHINITIS 02/13/2009  . HYPERLIPIDEMIA 02/12/2009  . Essential hypertension 02/12/2009  . PAROXYSMAL ATRIAL FIBRILLATION 02/12/2009    Past Surgical History:  Procedure Laterality Date  . ATRIAL ABLATION SURGERY  2007   [facility]  . CARDIOVERSION  08/11/2011   Procedure: CARDIOVERSION;  Surgeon: Lelon Perla, MD;  Location: [facility];  Service: Cardiovascular;  Laterality: N/A;  . EYE SURGERY     lasik, 1998  . HERNIA REPAIR     1977  . INGUINAL HERNIA REPAIR Right 08/26/2012   Procedure: LAPAROSCOPIC INGUINAL HERNIA;  Surgeon: Madilyn Hook, DO;  Location: [facility];  Service: General;  Laterality: Right;  laparoscopic right inguinal hernia repair with mesh, umbilical hernia repair  . INSERTION OF MESH Right 08/26/2012   Procedure: INSERTION OF MESH;  Surgeon: Madilyn Hook, DO;  Location: [facility];  Service: General;  Laterality: Right;  right inguinal hernia  . UMBILICAL HERNIA REPAIR N/A 08/26/2012   Procedure: HERNIA REPAIR UMBILICAL ADULT;  Surgeon: Madilyn Hook, DO;  Location: [facility];  Service: General;  Laterality: N/A;  Home Medications    Prior to Admission medications   Medication Sig Start Date End Date Taking? Authorizing Provider  acetaminophen (TYLENOL) 325 MG tablet Take 650 mg by mouth every 6 (six) hours as needed. For pain    [provider]  diltiazem (TAZTIA XT) 360 MG 24 hr capsule Take 360 mg by mouth every morning.    [provider]  DULoxetine (CYMBALTA) 30 MG capsule Take 30 mg by mouth daily.      [provider]  lisinopril (PRINIVIL,ZESTRIL) 40 MG tablet Take 1 tablet (40 mg total) by mouth daily. 03/31/12    Lelon Perla, MD  montelukast (SINGULAIR) 10 MG tablet Take 10 mg by mouth at bedtime as needed. Allergies.    [provider]  predniSONE (DELTASONE) 20 MG tablet Take 2 tablets (40 mg total) by mouth daily. 12/13/16   Recardo Evangelist, PA-C  sodium chloride (OCEAN) 0.65 % nasal spray Place 1 spray into the nose as needed for congestion.    [provider]  traMADol (ULTRAM) 50 MG tablet Take 2 tablets (100 mg total) by mouth every 8 (eight) hours as needed. 12/13/16   Recardo Evangelist, PA-C  warfarin (COUMADIN) 5 MG tablet Take as directed by coumadin clinic 10/08/16   Lelon Perla, MD    Family History Family History  Problem Relation Age of Onset  . Asthma Mother   . Breast cancer Mother   . COPD Father   . Lymphoma Sister     Social History Social History  Substance Use Topics  . Smoking status: Never Smoker  . Smokeless tobacco: Not on file  . Alcohol use No     Allergies   Patient has no known allergies.   Review of Systems Review of Systems  Constitutional: Negative for fever.  Musculoskeletal: Positive for back pain and myalgias. Negative for gait problem.  Neurological: Negative for weakness and numbness.     Physical Exam Updated Vital Signs BP (!) 141/97 (BP Location: Left Arm)   Pulse 83   Temp 98.3 F (36.8 C) (Oral)   Resp 16   SpO2 97%   Physical Exam  Constitutional: He is oriented to person, place, and time. He appears well-developed and well-nourished. He appears distressed (mildly).  HENT:  Head: Normocephalic and atraumatic.  Eyes: Conjunctivae are normal. Pupils are equal, round, and reactive to light. Right eye exhibits no discharge. Left eye exhibits no discharge. No scleral icterus.  Neck: Normal range of motion.  Cardiovascular: Normal rate.   Pulmonary/Chest: Effort normal. No respiratory distress.  Abdominal: He exhibits no distension.  Musculoskeletal:  Back: Inspection: No masses, deformity, or  rash Palpation: No midline spinal tenderness. Right sided low back tenderness Strength: 5/5 in lower extremities and normal plantar and dorsiflexion Sensation: Intact sensation with light touch in lower extremities bilaterally Gait: Normal gait   Neurological: He is alert and oriented to person, place, and time.  Skin: Skin is warm and dry.  Psychiatric: He has a normal mood and affect. His behavior is normal.  Nursing note and vitals reviewed.    ED Treatments / Results  Labs (all labs ordered are listed, but only abnormal results are displayed) Labs Reviewed - No data to display  EKG  EKG Interpretation None       Radiology No results found.  Procedures Procedures (including critical care time)  Medications Ordered in ED Medications - No data to display   Initial Impression / Assessment and Plan /  ED Course  I have reviewed the triage vital signs and the nursing notes.  Pertinent labs & imaging results that were available during my care of the patient were reviewed by me and considered in my medical decision making (see chart for details).  61 year old with acute on chronic right sided back pain with right sided sciatica. He is doing most of the conservative measures and is out of his Tramadol due to increased usage. Will refill Tramadol till Monday and try steroid burst. Discouraged him from taking extra Tramadol and that this medicine would not likely be refilled easily in the future by UC or the ED. [place] was checked and he is filling Tramadol appropriately. Encouraged PCP follow up and f/u with Ortho.  Final Clinical Impressions(s) / ED Diagnoses   Final diagnoses:  Acute left-sided low back pain with left-sided sciatica    New Prescriptions Discharge Medication List as of 12/13/2016  3:13 PM    START taking these medications   Details  predniSONE (DELTASONE) 20 MG tablet Take 2 tablets (40 mg total) by mouth daily., Starting Sat 12/13/2016, Print              Recardo Evangelist, PA-C 12/14/16 0459    Fredia Sorrow, MD 12/15/16 [phone]

## 2017-02-11 ENCOUNTER — Other Ambulatory Visit: Payer: Self-pay | Admitting: Cardiology

## 2017-02-19 ENCOUNTER — Ambulatory Visit (INDEPENDENT_AMBULATORY_CARE_PROVIDER_SITE_OTHER): Payer: 59 | Admitting: *Deleted

## 2017-02-19 DIAGNOSIS — Z5181 Encounter for therapeutic drug level monitoring: Secondary | ICD-10-CM

## 2017-02-19 DIAGNOSIS — I4891 Unspecified atrial fibrillation: Secondary | ICD-10-CM

## 2017-02-19 LAB — POCT INR: INR: 1.9

## 2017-03-14 ENCOUNTER — Other Ambulatory Visit: Payer: Self-pay | Admitting: Cardiology

## 2017-03-16 NOTE — Telephone Encounter (Signed)
Looks non-compliant - I'll let you determine refills

## 2017-03-23 ENCOUNTER — Encounter: Payer: Self-pay | Admitting: Physician Assistant

## 2017-03-23 ENCOUNTER — Ambulatory Visit (INDEPENDENT_AMBULATORY_CARE_PROVIDER_SITE_OTHER): Payer: 59 | Admitting: Physician Assistant

## 2017-03-23 ENCOUNTER — Ambulatory Visit (INDEPENDENT_AMBULATORY_CARE_PROVIDER_SITE_OTHER): Payer: 59 | Admitting: *Deleted

## 2017-03-23 ENCOUNTER — Other Ambulatory Visit: Payer: Self-pay | Admitting: Pharmacist

## 2017-03-23 ENCOUNTER — Encounter (INDEPENDENT_AMBULATORY_CARE_PROVIDER_SITE_OTHER): Payer: Self-pay

## 2017-03-23 ENCOUNTER — Telehealth: Payer: Self-pay | Admitting: *Deleted

## 2017-03-23 VITALS — BP 140/78 | HR 84 | Ht 77.0 in | Wt 293.0 lb

## 2017-03-23 DIAGNOSIS — I420 Dilated cardiomyopathy: Secondary | ICD-10-CM | POA: Diagnosis not present

## 2017-03-23 DIAGNOSIS — I4891 Unspecified atrial fibrillation: Secondary | ICD-10-CM

## 2017-03-23 DIAGNOSIS — R0602 Shortness of breath: Secondary | ICD-10-CM

## 2017-03-23 DIAGNOSIS — I481 Persistent atrial fibrillation: Secondary | ICD-10-CM

## 2017-03-23 DIAGNOSIS — I4819 Other persistent atrial fibrillation: Secondary | ICD-10-CM

## 2017-03-23 DIAGNOSIS — I10 Essential (primary) hypertension: Secondary | ICD-10-CM

## 2017-03-23 DIAGNOSIS — Z5181 Encounter for therapeutic drug level monitoring: Secondary | ICD-10-CM

## 2017-03-23 DIAGNOSIS — K219 Gastro-esophageal reflux disease without esophagitis: Secondary | ICD-10-CM | POA: Diagnosis not present

## 2017-03-23 DIAGNOSIS — E782 Mixed hyperlipidemia: Secondary | ICD-10-CM | POA: Diagnosis not present

## 2017-03-23 DIAGNOSIS — Z23 Encounter for immunization: Secondary | ICD-10-CM | POA: Diagnosis not present

## 2017-03-23 LAB — POCT INR: INR: 1.1

## 2017-03-23 MED ORDER — WARFARIN SODIUM 5 MG PO TABS: ORAL_TABLET | ORAL | 0 refills | Status: DC

## 2017-03-23 MED ORDER — FUROSEMIDE 20 MG PO TABS: 20.0000 mg | ORAL_TABLET | Freq: Every day | ORAL | 3 refills | Status: DC

## 2017-03-23 MED ORDER — RIVAROXABAN 20 MG PO TABS: 20.0000 mg | ORAL_TABLET | Freq: Every day | ORAL | 3 refills | Status: DC

## 2017-03-23 NOTE — Patient Instructions (Addendum)
Medication Instructions:  1. STOP HCTZ  2. START LASIX 20 MG DAILY; RX HAS BEEN SENT IN  3. STOP taking Coumadin  4. START XARELTO 20 MG DAILY; RX HAS BEEN SENT IN  Labwork: 1. TODAY BMET, CBC, TSH, PRO BNP  2. BMET TO BE DONE IN 1 WEEK  Testing/Procedures: 1. Your physician has requested that you have an echocardiogram. Echocardiography is a painless test that uses sound waves to create images of your heart. It provides your doctor with information about the size and shape of your heart and how well your heart's chambers and valves are working. This procedure takes approximately one hour. There are no restrictions for this procedure.  Follow-Up: 1. DR. Stanford Breed IN 3 WEEKS  2. YOU ARE BEING REFERRED TO DR. ALLRED; THIS APPT CAN BE IN 2-3 MONTHS; RECURRENT A-FIB  Any Other Special Instructions Will Be Listed Below (If Applicable). YOU HAVE BEEN GIVEN SAMPLES OF XARELTO   If you need a refill on your cardiac medications before your next appointment, please call your pharmacy.

## 2017-03-23 NOTE — Progress Notes (Signed)
Cardiology Office Note:    Date:  03/23/2017   ID:  [name], DOB [birth year], MRN [id number]  PCP:  [name], MD  Cardiologist:  Dr. Kirk Ruths    Referring MD: [name], MD   Chief Complaint  Patient presents with  . Atrial Fibrillation    History of Present Illness:    [name] is a 61 y.o. male with a hx of paroxysmal AF.  He has a hx of Cardiac Catheterization in 2004 that demonstrated an EF 45 and normal coronary arteries.  His ejection fraction improved with restoration of normal sinus rhythm. He ultimately underwent PVI ablation for atrial fibrillation. He had recurrent AF in 2010 with associated reduced LVF (EF 35-40) and was admitted to [facility].  He underwent TEE-DCCV.  EF was 25-30 at the time of TEE.  A follow up echocardiogram showed improved LVF with EF 60-65.  He was evaluated by Dr. Thompson Grayer and it was felt that repeat ablation for atrial fibrillation could be considered if he had recurrent atrial fibrillation.  He had another episode of atrial fibrillation in 1/13 and underwent DCCV in 2/13.  He had a syncopal episode in 7/16 while unloading a truck.  Last seen by Dr. Kirk Ruths in 11/2015.  A FU echocardiogram was recommended but never done.  Of note, the patient is on Coumadin due to cost of DOACs. The patient was seen at his PCPs office today and noted to be back in AFib.     [name] returns for evaluation of atrial fibrillation at the request of his PCP.   He is here alone.  He could tell that he went out of rhythm about a week ago.  He denies rapid palpitations.  But, he notes orthopnea and paroxysmal nocturnal dyspnea.  He has some mild edema in his legs. He denies significant weight gain, chest pain, syncope.  He missed his last Coumadin Clinic appt.  He had been off of his Coumadin for a couple days and his PCP refilled it for him today.  He denies any bleeding issues.    Prior CV studies:   The following studies were  reviewed today:  Echocardiogram 06/16/11 EF 60-65, Gr 2 DD  Cardiac Catheterization 04/2003 Normal coronary arteries; EF 40-45  Past Medical History:  Diagnosis Date  . Allergic rhinitis   . Asthma    as a child  . Complication of anesthesia    "hard to put asleep, hard to wake up, nausea and vomiting"  . Depression   . HLD (hyperlipidemia)   . HTN (hypertension)    sees Dr. [name], [facility]  . Hypertrophy of prostate with urinary obstruction and other lower urinary tract symptoms (LUTS)   . Inguinal hernia without mention of obstruction or gangrene, unilateral or unspecified, (not specified as recurrent)   . Lumbar herniated disc    L7  . Narcotic addiction ([place])   . NICM (nonischemic cardiomyopathy) ([place])   . PAF (paroxysmal atrial fibrillation) ([place])    ablation done 03/2006, sees Dr. Ginger Organ, [facility]  . Pneumonia    hx of 2004  . PONV (postoperative nausea and vomiting)     Past Surgical History:  Procedure Laterality Date  . ATRIAL ABLATION SURGERY  2007   [facility]  . CARDIOVERSION  08/11/2011   Procedure: CARDIOVERSION;  Surgeon: Lelon Perla, MD;  Location: [facility];  Service: Cardiovascular;  Laterality: N/A;  . EYE SURGERY     lasik,  [place]  . INGUINAL HERNIA REPAIR Right 08/26/2012   Procedure: LAPAROSCOPIC INGUINAL HERNIA;  Surgeon: Madilyn Hook, DO;  Location: [facility];  Service: General;  Laterality: Right;  laparoscopic right inguinal hernia repair with mesh, umbilical hernia repair  . INSERTION OF MESH Right 08/26/2012   Procedure: INSERTION OF MESH;  Surgeon: Madilyn Hook, DO;  Location: [facility];  Service: General;  Laterality: Right;  right inguinal hernia  . UMBILICAL HERNIA REPAIR N/A 08/26/2012   Procedure: HERNIA REPAIR UMBILICAL ADULT;  Surgeon: Madilyn Hook, DO;  Location: [facility];  Service: General;  Laterality: N/A;    Current Medications: Current Meds  Medication Sig  . acetaminophen (TYLENOL) 325 MG tablet  Take 650 mg by mouth every 6 (six) hours as needed. For pain  . ALPRAZolam (XANAX) 0.5 MG tablet Take 0.5 mg by mouth at bedtime as needed for anxiety.  . cyclobenzaprine (FLEXERIL) 10 MG tablet Take 10 mg by mouth 3 (three) times daily as needed for muscle spasms.  Marland Kitchen diltiazem (TAZTIA XT) 360 MG 24 hr capsule Take 360 mg by mouth every morning.  . DULoxetine (CYMBALTA) 30 MG capsule Take 30 mg by mouth daily.    Marland Kitchen lisinopril (PRINIVIL,ZESTRIL) 40 MG tablet Take 1 tablet (40 mg total) by mouth daily.  . montelukast (SINGULAIR) 10 MG tablet Take 10 mg by mouth at bedtime as needed. Allergies.  . predniSONE (DELTASONE) 20 MG tablet Take 20 mg by mouth as needed (inflamation).  . sodium chloride (OCEAN) 0.65 % nasal spray Place 1 spray into the nose as needed for congestion.  . traMADol (ULTRAM) 50 MG tablet Take 50 mg by mouth every 8 (eight) hours as needed for severe pain.  . [DISCONTINUED] hydrochlorothiazide (HYDRODIURIL) 25 MG tablet Take 25 mg by mouth daily.  . [DISCONTINUED] warfarin (COUMADIN) 5 MG tablet Take 1 to 1 and 1/2 tablet daily as directed by coumadin clinic. *CALL COUMADIN CLINIC FOR APPOINTMENT PRIOR TO NEXT REFILL AUTHORIZATION*     Allergies:   Patient has no known allergies.   Social History   Social History  . Marital status: Married    Spouse name: N/A  . Number of children: N/A  . Years of education: N/A   Occupational History  . owns landscaping business    Social History Main Topics  . Smoking status: Never Smoker  . Smokeless tobacco: Never Used  . Alcohol use No  . Drug use: No  . Sexual activity: Yes   Other Topics Concern  . None   Social History Narrative  . None     Family Hx: The patient's family history includes Asthma in his mother; Breast cancer in his mother; COPD in his father; Lymphoma in his sister.  ROS:   Please see the history of present illness.    ROS All other systems reviewed and are negative.   EKGs/Labs/Other Test  Reviewed:    EKG:  EKG is  ordered today.  The ekg ordered today demonstrates AFib, HR 85, normal axis  Recent Labs: No results found for requested labs within last 8760 hours.  Labs from PCP 09/09/16: Hgb 16.1, creatinine 1.15, K 4.7, ALT 25, LDL 124   Physical Exam:    VS:  BP 140/78   Pulse 84   Ht 6\' 5"  (1.956 m)   Wt 293 lb (132.9 kg)   BMI 34.74 kg/m     Wt Readings from Last 3 Encounters:  03/23/17 293 lb (  132.9 kg)  01/02/16 270 lb (122.5 kg)  11/15/15 288 lb (130.6 kg)     Physical Exam  Constitutional: He is oriented to person, place, and time. He appears well-developed and well-nourished. No distress.  HENT:  Head: Normocephalic and atraumatic.  Eyes: No scleral icterus.  Neck: JVD: I cannot assess JVD.  Cardiovascular: Normal rate.  An irregularly irregular rhythm present.  Murmur heard. Pulmonary/Chest: Effort normal. He has no rales.  Abdominal: Soft. He exhibits no distension.  Musculoskeletal: He exhibits edema (trace bilat ankle edema).  Neurological: He is alert and oriented to person, place, and time.  Skin: Skin is warm and dry.  Psychiatric: He has a normal mood and affect.    ASSESSMENT:    1. Persistent atrial fibrillation ([place])   2. Shortness of breath   3. Essential hypertension   4. Dilated cardiomyopathy ([place])    PLAN:    In order of problems listed above:  1. Persistent atrial fibrillation ([place]) He presents with recurrent atrial fibrillation.  He is symptomatic. His INR today is 1.1.  He is willing to take Xarelto for 2 months to expedite proceeding with DCCV.  He saw Dr. Thompson Grayer years ago.  It was felt that he could be considered for repeat ablation if he has recurrent atrial fibrillation.  CHADS2-VASc=2 (CHF, HTN).     -  DC Coumadin  -  Start Xarelto 20 mg QD (CrCl 130).  -  Obtain BMET, CBC, TSH today  -  FU with Dr. Kirk Ruths in 3 weeks.  Consider arranging DCCV at that visit if still in AFib.  -  I will refer to Dr.  Thompson Grayer as well to discuss +/- repeat AFib ablation   -  Once he is back in NSR, can consider transitioning back to Coumadin if cost prohibitive   2. Shortness of breath He has issues with dilated cardiomyopathy with atrial fibrillation.  He has had symptoms of orthopnea, paroxysmal nocturnal dyspnea.  His lungs are clear on exam.  However, I suspect he is s/w volume overloaded.    -  Check echocardiogram  -  BMET, BNP today  -  DC HCTZ  -  Start Lasix 20 mg QD  -  BMET 1 week  3. Essential hypertension BP above target.  Continue to monitor.    4. Dilated cardiomyopathy (HCC) Hx of DCM with improved LVF once he is back in NSR.  Will get echocardiogram as noted.  If EF is down, consider changing Diltiazem to beta-blocker.  Continue ACE inhibitor.     Dispo:  Return in about 3 weeks (around 04/13/2017) for Close Follow Up w/ Dr. Stanford Breed .   Medication Adjustments/Labs and Tests Ordered: Current medicines are reviewed at length with the patient today.  Concerns regarding medicines are outlined above.  Tests Ordered: Orders Placed This Encounter  Procedures  . Basic Metabolic Panel (BMET)  . CBC  . TSH  . Pro b natriuretic peptide  . Basic metabolic panel  . EKG 12-Lead  . ECHOCARDIOGRAM COMPLETE   Medication Changes: Meds ordered this encounter  Medications  . furosemide (LASIX) 20 MG tablet    Sig: Take 1 tablet (20 mg total) by mouth daily.    Dispense:  90 tablet    Refill:  3  . rivaroxaban (XARELTO) 20 MG TABS tablet    Sig: Take 1 tablet (20 mg total) by mouth daily with supper.    Dispense:  90 tablet    Refill:  3    Signed, Richardson Dopp, PA-C  03/23/2017 5:01 PM    [facility] [address], [place], [place]  [address] Phone: [phone]; Fax: [phone]

## 2017-03-23 NOTE — Telephone Encounter (Signed)
Spoke with person in dr reed's office, the patient is there with a hx of trouble breathing for one week to 10 days. They report the patient is atrial fib. Heart rate 83 and bp 124/86. He reports orthopnea. They would like the patient seen today. Follow up scheduled today @ 3:45 pm with [facility] at the [address] location.

## 2017-03-24 ENCOUNTER — Telehealth: Payer: Self-pay | Admitting: *Deleted

## 2017-03-24 LAB — BASIC METABOLIC PANEL
BUN/Creatinine Ratio: 15 (ref 9–20)
BUN: 17 mg/dL (ref 6–24)
CHLORIDE: 101 mmol/L (ref 96–106)
CO2: 26 mmol/L (ref 20–29)
Calcium: 9.6 mg/dL (ref 8.7–10.2)
Creatinine, Ser: 1.13 mg/dL (ref 0.76–1.27)
GFR calc non Af Amer: 71 mL/min/{1.73_m2} (ref >59)
GFR, EST AFRICAN AMERICAN: 82 mL/min/{1.73_m2} (ref >59)
Glucose: 91 mg/dL (ref 65–99)
POTASSIUM: 4.4 mmol/L (ref 3.5–5.2)
Sodium: 141 mmol/L (ref 134–144)

## 2017-03-24 LAB — CBC
Hematocrit: 45 % (ref 37.5–51.0)
Hemoglobin: 15.2 g/dL (ref 13.0–17.7)
MCH: 30.3 pg (ref 26.6–33.0)
MCHC: 33.8 g/dL (ref 31.5–35.7)
MCV: 90 fL (ref 79–97)
Platelets: 155 10*3/uL (ref 150–379)
RBC: 5.01 x10E6/uL (ref 4.14–5.80)
RDW: 13.5 % (ref 12.3–15.4)
WBC: 8.5 10*3/uL (ref 3.4–10.8)

## 2017-03-24 LAB — PRO B NATRIURETIC PEPTIDE: NT-PRO BNP: 327 pg/mL — AB (ref 0–210)

## 2017-03-24 LAB — TSH: TSH: 1.85 u[IU]/mL (ref 0.450–4.500)

## 2017-03-24 NOTE — Telephone Encounter (Signed)
Left message to go over lab results.  

## 2017-03-24 NOTE — Telephone Encounter (Signed)
-----   Message from Liliane Shi, [place] sent at 03/24/2017  2:22 PM EDT ----- Please call the patient His kidney function (creatinine), blood counts (hemoglobin), thyroid test (TSH) are all normal. BNP is minimally elevated. Continue with current medications as outlined at [place] yesterday. Keep follow up as planned. Richardson Dopp, PA-C    03/24/2017 2:22 PM

## 2017-03-24 NOTE — Telephone Encounter (Signed)
-----   Message from Liliane Shi, [place] sent at 03/24/2017  2:22 PM EDT ----- Please call the patient His kidney function (creatinine), blood counts (hemoglobin), thyroid test (TSH) are all normal. BNP is minimally elevated. Continue with current medications as outlined at [place] yesterday. Keep follow up as planned. Richardson Dopp, PA-C    03/24/2017 2:22 PM

## 2017-03-24 NOTE — Telephone Encounter (Signed)
Pt has been notified of lab results by phone with verbal understanding. Pt thanked me for being so quick to get his results to him. Pt aware to continue on medications as discussed at [place] yesterday 03/23/17.

## 2017-03-31 ENCOUNTER — Ambulatory Visit (HOSPITAL_COMMUNITY): Payer: 59 | Attending: Cardiology

## 2017-03-31 ENCOUNTER — Other Ambulatory Visit: Payer: Self-pay

## 2017-03-31 ENCOUNTER — Encounter: Payer: Self-pay | Admitting: Physician Assistant

## 2017-03-31 ENCOUNTER — Other Ambulatory Visit: Payer: 59

## 2017-03-31 DIAGNOSIS — Z720 Tobacco use: Secondary | ICD-10-CM | POA: Insufficient documentation

## 2017-03-31 DIAGNOSIS — I10 Essential (primary) hypertension: Secondary | ICD-10-CM | POA: Diagnosis not present

## 2017-03-31 DIAGNOSIS — R0600 Dyspnea, unspecified: Secondary | ICD-10-CM | POA: Insufficient documentation

## 2017-03-31 DIAGNOSIS — I4891 Unspecified atrial fibrillation: Secondary | ICD-10-CM | POA: Diagnosis not present

## 2017-03-31 DIAGNOSIS — E785 Hyperlipidemia, unspecified: Secondary | ICD-10-CM | POA: Insufficient documentation

## 2017-03-31 DIAGNOSIS — I420 Dilated cardiomyopathy: Secondary | ICD-10-CM | POA: Diagnosis not present

## 2017-03-31 NOTE — Progress Notes (Signed)
HPI: FU paroxysmal atrial fibrillation. The patient had a cardiac catheterization in 2004 that showed an ejection fraction of 45% and normal coronary arteries. He did have atrial fibrillation at that time and his LV function improved after sinus rhythm was restored. He ultimately had atrial fibrillation ablation. He did well for several years. However in August of 2010 he was admitted to [facility] with recurrent atrial fibrillation. An echocardiogram performed on August 11 of 2010 showed an ejection fraction of 35-40%. There was biatrial enlargement. There was at least moderate mitral regurgitation. The patient subsequently had a transesophageal echocardiogram that showed an ejection fraction of 35% and moderate mitral regurgitation. He underwent cardioversion at that time as there was no left atrial appendage thrombus. He was seen in followup by Dr. Rayann Heman and close followup was felt to be indicated. Possible attempt at another ablation could be considered in the future if his atrial fibrillation recurred.  Pt seen in office 03/23/17 with recurrent atrial fibrillation. Echo repeated 03/31/17 and showed normal LV function. Since last seen, patient has some fatigue with exertion. He also notes increased dyspnea on exertion. Occasional orthopnea. No PND, palpitations, chest pain or syncope. No bleeding.  Current Outpatient Prescriptions  Medication Sig Dispense Refill  . acetaminophen (TYLENOL) 325 MG tablet Take 650 mg by mouth every 6 (six) hours as needed. For pain    . ALPRAZolam (XANAX) 0.5 MG tablet Take 0.5 mg by mouth at bedtime as needed for anxiety.    . cyclobenzaprine (FLEXERIL) 10 MG tablet Take 10 mg by mouth 3 (three) times daily as needed for muscle spasms.    Marland Kitchen diltiazem (TAZTIA XT) 360 MG 24 hr capsule Take 360 mg by mouth every morning.    . DULoxetine (CYMBALTA) 30 MG capsule Take 30 mg by mouth daily.      . furosemide (LASIX) 20 MG tablet Take 1 tablet (20 mg total) by  mouth daily. 90 tablet 3  . lisinopril (PRINIVIL,ZESTRIL) 40 MG tablet Take 1 tablet (40 mg total) by mouth daily. 30 tablet 12  . montelukast (SINGULAIR) 10 MG tablet Take 10 mg by mouth at bedtime as needed. Allergies.    . predniSONE (DELTASONE) 20 MG tablet Take 20 mg by mouth as needed (inflamation).    . rivaroxaban (XARELTO) 20 MG TABS tablet Take 1 tablet (20 mg total) by mouth daily with supper. 90 tablet 3  . sodium chloride (OCEAN) 0.65 % nasal spray Place 1 spray into the nose as needed for congestion.    . traMADol (ULTRAM) 50 MG tablet Take 50 mg by mouth every 8 (eight) hours as needed for severe pain.     No current facility-administered medications for this visit.      Past Medical History:  Diagnosis Date  . Allergic rhinitis   . Asthma    as a child  . Complication of anesthesia    "hard to put asleep, hard to wake up, nausea and vomiting"  . Depression   . History of echocardiogram    Echocardiogram 9/18: EF 60-65, normal wall motion  . HLD (hyperlipidemia)   . HTN (hypertension)    sees Dr. [name], [facility]  . Hypertrophy of prostate with urinary obstruction and other lower urinary tract symptoms (LUTS)   . Inguinal hernia without mention of obstruction or gangrene, unilateral or unspecified, (not specified as recurrent)   . Lumbar herniated disc    L7  . Narcotic addiction ([place])   .  NICM (nonischemic cardiomyopathy) ([place])   . PAF (paroxysmal atrial fibrillation) ([place])    ablation done 03/2006, sees Dr. Ginger Organ, [facility]  . Pneumonia    hx of 2004  . PONV (postoperative nausea and vomiting)     Past Surgical History:  Procedure Laterality Date  . ATRIAL ABLATION SURGERY  2007   [facility]  . CARDIOVERSION  08/11/2011   Procedure: CARDIOVERSION;  Surgeon: Lelon Perla, MD;  Location: [facility];  Service: Cardiovascular;  Laterality: N/A;  . EYE SURGERY     las[place], 1998  . HERNIA REPAIR     1977  . INGUINAL HERNIA REPAIR Right 08/26/2012    Procedure: LAPAROSCOPIC INGUINAL HERNIA;  Surgeon: Madilyn Hook, DO;  Location: [facility] [place];  Service: General;  Laterality: Right;  laparoscopic right inguinal hernia repair with mesh, umbilical hernia repair  . INSERTION OF MESH Right 08/26/2012   Procedure: INSERTION OF MESH;  Surgeon: Madilyn Hook, DO;  Location: [place];  Service: General;  Laterality: Right;  right inguinal hernia  . UMBILICAL HERNIA REPAIR N/A 08/26/2012   Procedure: HERNIA REPAIR UMBILICAL ADULT;  Surgeon: Madilyn Hook, DO;  Location: [place];  Service: General;  Laterality: N/A;    Social History   Social History  . Marital status: Married    Spouse name: N/A  . Number of children: N/A  . Years of education: N/A   Occupational History  . owns landscaping business    Social History Main Topics  . Smoking status: Never Smoker  . Smokeless tobacco: Never Used  . Alcohol use No  . Drug use: No  . Sexual activity: Yes   Other Topics Concern  . Not on file   Social History Narrative  . No narrative on file    Family History  Problem Relation Age of Onset  . Asthma Mother   . Breast cancer Mother   . COPD Father   . Lymphoma Sister     ROS: no fevers or chills, productive cough, hemoptysis, dysphasia, odynophagia, melena, hematochezia, dysuria, hematuria, rash, seizure activity, orthopnea, PND, pedal edema, claudication. Remaining systems are negative.  Physical Exam: Well-developed well-nourished in no acute distress.  Skin is warm and dry.  HEENT is normal.  Neck is supple.  Chest is clear to auscultation with normal expansion.  Cardiovascular exam is irregular, tachycardic Abdominal exam nontender or distended. No masses palpated. Extremities show no edema. neuro grossly intact  ECG- Atrial fibrillation at a rate of 128. RV conduction delay. Nonspecific ST changes. personally reviewed  A/P  1 Persistent atrial fibrillation-Increase cardizem to 360 mg in am and 120 mg in PM. Continue xarelto  (initiated 9/17). We will arrange cardioversion at the end of next week. Continue xarelto afterwards indefinitely. If he has more frequent episodes in the future we can consider referral back for ablation or antiarrhythmic therapy.   2 hypertension-blood pressure is controlled. Continue present medications but increase Cardizem as outlined above for rate control..  3 History of cardiomyopathy-follow-up echocardiogram shows normal LV function. This is felt secondary to tachycardia in the past.   4 Snoring-I will arrange a pulmonary evaluation to rule out sleep apnea as a contributor to his atrial fibrillation.  Kirk Ruths, MD

## 2017-04-01 ENCOUNTER — Encounter (HOSPITAL_COMMUNITY): Payer: Self-pay | Admitting: Emergency Medicine

## 2017-04-01 ENCOUNTER — Emergency Department (HOSPITAL_COMMUNITY)
Admission: EM | Admit: 2017-04-01 | Discharge: 2017-04-01 | Disposition: A | Discharge status: Home / Self Care | Payer: 59 | Attending: Emergency Medicine | Admitting: Emergency Medicine

## 2017-04-01 ENCOUNTER — Telehealth: Payer: Self-pay | Admitting: *Deleted

## 2017-04-01 DIAGNOSIS — I10 Essential (primary) hypertension: Secondary | ICD-10-CM | POA: Diagnosis not present

## 2017-04-01 DIAGNOSIS — Z79899 Other long term (current) drug therapy: Secondary | ICD-10-CM | POA: Insufficient documentation

## 2017-04-01 DIAGNOSIS — E785 Hyperlipidemia, unspecified: Secondary | ICD-10-CM | POA: Diagnosis not present

## 2017-04-01 DIAGNOSIS — I480 Paroxysmal atrial fibrillation: Secondary | ICD-10-CM | POA: Diagnosis not present

## 2017-04-01 DIAGNOSIS — Z760 Encounter for issue of repeat prescription: Secondary | ICD-10-CM | POA: Diagnosis not present

## 2017-04-01 DIAGNOSIS — I428 Other cardiomyopathies: Secondary | ICD-10-CM | POA: Insufficient documentation

## 2017-04-01 DIAGNOSIS — J45909 Unspecified asthma, uncomplicated: Secondary | ICD-10-CM | POA: Diagnosis not present

## 2017-04-01 LAB — BASIC METABOLIC PANEL
BUN / CREAT RATIO: 18 (ref 9–20)
BUN: 19 mg/dL (ref 6–24)
CALCIUM: 8.9 mg/dL (ref 8.7–10.2)
CHLORIDE: 107 mmol/L — AB (ref 96–106)
CO2: 19 mmol/L — ABNORMAL LOW (ref 20–29)
Creatinine, Ser: 1.06 mg/dL (ref 0.76–1.27)
GFR calc Af Amer: 88 mL/min/{1.73_m2} (ref >59)
GFR calc non Af Amer: 76 mL/min/{1.73_m2} (ref >59)
GLUCOSE: 108 mg/dL — AB (ref 65–99)
POTASSIUM: 4.6 mmol/L (ref 3.5–5.2)
Sodium: 144 mmol/L (ref 134–144)

## 2017-04-01 MED ORDER — TRAMADOL HCL 50 MG PO TABS
100.0000 mg | ORAL_TABLET | Freq: Once | ORAL | Status: AC
  Administered 2017-04-01: 100 mg via ORAL
  Filled 2017-04-01: qty 2

## 2017-04-01 NOTE — ED Notes (Signed)
Has a torn rotator cuff and x 1 month and states takes 50 mg toradol x 5 a day states is miserable has appointment to see ortho but has run out of meds

## 2017-04-01 NOTE — Discharge Instructions (Signed)
Please follow up with your primary care provider for refills of your controlled medications.

## 2017-04-01 NOTE — ED Triage Notes (Signed)
Pt is out of meds-- has been taking tramadol for 1 yr-- can get it filled in the morning, but has been taking extra for right shoulder pain, rotator cuff--  Has an appt with orthopedist for shoulder. Requesting tramadol to last til tomorrow morning.

## 2017-04-01 NOTE — Telephone Encounter (Signed)
Left message to go over echo results. 

## 2017-04-01 NOTE — ED Notes (Signed)
Pt maD HE IS NOT GETTING RX FOR MEDS HE LEFT WIFE SIGNED

## 2017-04-01 NOTE — ED Provider Notes (Signed)
[facility] DEPT Provider Note   CSN: [id number] Arrival date & time: 04/01/17  1411     History   Chief Complaint Chief Complaint  Patient presents with  . Medication Refill    HPI [name] is a 61 y.o. male with past medical history of hypertension, a chill D, depression, asthma, narcotic addiction, presenting to the ED for refill of his tramadol. Patient takes 100 mg of tramadol every 6 hours as needed for pain. He reports he has run out tonight and is unable to fill his prescription until 8 AM in the morning. He states he has a recent new injury to the right rotator cuff, and has been taking an extra pill last few days, causing him to an out. Per [facility] narcotic database, patient prescribed 30 day supply of tramadol on 03/06/2017. Pt is prescribed this medication by his primary care provider. He reports that he has an appointment with the orthopedic specialist to follow up on his shoulder in the next week. He states he was given a sling by his primary care, however has not been using it. Reports no new injuries today. The history is provided by the patient.    Past Medical History:  Diagnosis Date  . Allergic rhinitis   . Asthma    as a child  . Complication of anesthesia    "hard to put asleep, hard to wake up, nausea and vomiting"  . Depression   . History of echocardiogram    Echocardiogram 9/18: EF 60-65, normal wall motion  . HLD (hyperlipidemia)   . HTN (hypertension)    sees Dr. [name], [facility]  . Hypertrophy of prostate with urinary obstruction and other lower urinary tract symptoms (LUTS)   . Inguinal hernia without mention of obstruction or gangrene, unilateral or unspecified, (not specified as recurrent)   . Lumbar herniated disc    L7  . Narcotic addiction ([place])   . NICM (nonischemic cardiomyopathy) ([place])   . PAF (paroxysmal atrial fibrillation) ([place])    ablation done 03/2006, sees Dr. Ginger Organ, [facility]  . Pneumonia    hx of 2004   . PONV (postoperative nausea and vomiting)     Patient Active Problem List   Diagnosis Date Noted  . Syncope 11/15/2015  . Encounter for therapeutic drug monitoring 08/02/2013  . Concussion 01/13/2013  . Fall 01/13/2013  . Multiple fractures of ribs of left side 01/13/2013  . Traumatic pneumothorax 01/13/2013  . Long term current use of anticoagulant 09/18/2010  . MITRAL INSUFFICIENCY 03/14/2009  . Dilated cardiomyopathy ([place]) 03/14/2009  . DEPRESSION 02/13/2009  . ALLERGIC RHINITIS 02/13/2009  . HYPERLIPIDEMIA 02/12/2009  . PAROXYSMAL ATRIAL FIBRILLATION 02/12/2009    Past Surgical History:  Procedure Laterality Date  . ATRIAL ABLATION SURGERY  2007   [facility]  . CARDIOVERSION  08/11/2011   Procedure: CARDIOVERSION;  Surgeon: Lelon Perla, MD;  Location: [facility];  Service: Cardiovascular;  Laterality: N/A;  . EYE SURGERY     lasik, 1998  . HERNIA REPAIR     1977  . INGUINAL HERNIA REPAIR Right 08/26/2012   Procedure: LAPAROSCOPIC INGUINAL HERNIA;  Surgeon: Madilyn Hook, DO;  Location: [facility];  Service: General;  Laterality: Right;  laparoscopic right inguinal hernia repair with mesh, umbilical hernia repair  . INSERTION OF MESH Right 08/26/2012   Procedure: INSERTION OF MESH;  Surgeon: Madilyn Hook, DO;  Location: [facility];  Service: General;  Laterality: Right;  right inguinal hernia  . UMBILICAL HERNIA REPAIR  N/A 08/26/2012   Procedure: HERNIA REPAIR UMBILICAL ADULT;  Surgeon: Madilyn Hook, DO;  Location: [facility];  Service: General;  Laterality: N/A;       Home Medications    Prior to Admission medications   Medication Sig Start Date End Date Taking? Authorizing Provider  acetaminophen (TYLENOL) 325 MG tablet Take 650 mg by mouth every 6 (six) hours as needed. For pain    [provider]  ALPRAZolam (XANAX) 0.5 MG tablet Take 0.5 mg by mouth at bedtime as needed for anxiety.    [provider]  cyclobenzaprine (FLEXERIL) 10 MG tablet Take 10 mg by mouth 3  (three) times daily as needed for muscle spasms.    [provider]  diltiazem (TAZTIA XT) 360 MG 24 hr capsule Take 360 mg by mouth every morning.    [provider]  DULoxetine (CYMBALTA) 30 MG capsule Take 30 mg by mouth daily.      [provider]  furosemide (LASIX) 20 MG tablet Take 1 tablet (20 mg total) by mouth daily. 03/23/17 06/21/17  Richardson Dopp T, PA-C  lisinopril (PRINIVIL,ZESTRIL) 40 MG tablet Take 1 tablet (40 mg total) by mouth daily. 03/31/12   Lelon Perla, MD  montelukast (SINGULAIR) 10 MG tablet Take 10 mg by mouth at bedtime as needed. Allergies.    [provider]  predniSONE (DELTASONE) 20 MG tablet Take 20 mg by mouth as needed (inflamation).    [provider]  rivaroxaban (XARELTO) 20 MG TABS tablet Take 1 tablet (20 mg total) by mouth daily with supper. 03/23/17   Richardson Dopp T, PA-C  sodium chloride (OCEAN) 0.65 % nasal spray Place 1 spray into the nose as needed for congestion.    [provider]  traMADol (ULTRAM) 50 MG tablet Take 50 mg by mouth every 8 (eight) hours as needed for severe pain.    [provider]    Family History Family History  Problem Relation Age of Onset  . Asthma Mother   . Breast cancer Mother   . COPD Father   . Lymphoma Sister     Social History Social History  Substance Use Topics  . Smoking status: Never Smoker  . Smokeless tobacco: Never Used  . Alcohol use No     Allergies   Patient has no known allergies.   Review of Systems Review of Systems  Musculoskeletal: Positive for arthralgias and myalgias.  All other systems reviewed and are negative.    Physical Exam Updated Vital Signs BP 134/83 (BP Location: Left Arm)   Pulse 85   Temp 98.4 F (36.9 C) (Oral)   Resp 20   SpO2 99%   Physical Exam  Constitutional: He appears well-developed and well-nourished.  HENT:  Head: Normocephalic and atraumatic.  Eyes: Conjunctivae are normal.    Cardiovascular: Normal rate, regular rhythm, normal heart sounds and intact distal pulses.   Pulmonary/Chest: Effort normal and breath sounds normal.  Psychiatric: He has a normal mood and affect. His behavior is normal.  Nursing note and vitals reviewed.    ED Treatments / Results  Labs (all labs ordered are listed, but only abnormal results are displayed) Labs Reviewed - No data to display  EKG  EKG Interpretation None       Radiology No results found.  Procedures Procedures (including critical care time)  Medications Ordered in ED Medications  traMADol (ULTRAM) tablet 100 mg (100 mg Oral Given 04/01/17 1821)     Initial Impression /  Assessment and Plan / ED Course  I have reviewed the triage vital signs and the nursing notes.  Pertinent labs & imaging results that were available during my care of the patient were reviewed by me and considered in my medical decision making (see chart for details).     Patient presenting for medication refill of tramadol. Per the [place] narcotic database, patient prescribed 30 day supply of tramadol on 03/06/2017. Patient reports he has been taking more than prescribed due to increased pain, and is unable to pick up his prescription until 8 AM tomorrow morning. He should requesting prescription to get him through the night until 8 AM. Home dose of tramadol given in ED, however had discussion with patient about misuse of medication. Informed patient we would not be providing him with prescription of tramadol. Patient became very angry and began yelling and saying "I refuse to pay this bill" and file a formal complaint. Pt stating "you are going to let me be in pain all night!" Discussed with this patient that we empathize with his pain, however are unable to provide him with more medication. Discussed alternative options for symptom management, including topical ice or heat, or OTC pain medication. Recommended patient begin wearing the sling that  he was provided by his PCP for right shoulder. Pt continued to yell and left ED prior to discharge paperwork. Pt's wife stayed to sign for paperwork.   Patient discussed with Dr. Billy Fischer, who agrees with care plan.  Discussed results, findings, treatment and follow up. Patient advised of return precautions. Patient verbalized understanding and agreed with plan.  Final Clinical Impressions(s) / ED Diagnoses   Final diagnoses:  Encounter for medication refill    New Prescriptions New Prescriptions   No medications on file     Russo, [place] N, PA-C 04/01/17 2043    Gareth Morgan, MD 04/02/17 [phone]

## 2017-04-01 NOTE — Telephone Encounter (Signed)
-----   Message from Liliane Shi, [place] sent at 03/31/2017  5:17 PM EDT ----- Please call the patient. The echocardiogram demonstrates normal LV function. Continue current medications and follow-up as planned. Please fax a copy of this study result to his PCP:  [name], MD  Thanks! Richardson Dopp, PA-C    03/31/2017 5:16 PM

## 2017-04-03 NOTE — Telephone Encounter (Signed)
Pt ha been notified of both lab and echo results by phone with verbal understanding. Pt asked if being on Louanna Raw will he need to continue to have his finger stuck like when he was on Coumadin. I explained to the pt he will not and that we will monitor his Hgb/Hct through regular lab work. Pt thanked me for my call and my help today. I will fax a copy of results to PCP.

## 2017-04-06 ENCOUNTER — Encounter: Payer: Self-pay | Admitting: Cardiology

## 2017-04-06 ENCOUNTER — Ambulatory Visit (INDEPENDENT_AMBULATORY_CARE_PROVIDER_SITE_OTHER): Payer: 59 | Admitting: Cardiology

## 2017-04-06 ENCOUNTER — Encounter: Payer: Self-pay | Admitting: *Deleted

## 2017-04-06 VITALS — BP 136/82 | HR 128 | Ht 77.0 in | Wt 286.0 lb

## 2017-04-06 DIAGNOSIS — I10 Essential (primary) hypertension: Secondary | ICD-10-CM | POA: Diagnosis not present

## 2017-04-06 DIAGNOSIS — R0683 Snoring: Secondary | ICD-10-CM | POA: Diagnosis not present

## 2017-04-06 DIAGNOSIS — I480 Paroxysmal atrial fibrillation: Secondary | ICD-10-CM | POA: Diagnosis not present

## 2017-04-06 MED ORDER — DILTIAZEM HCL ER COATED BEADS 120 MG PO CP24: 120.0000 mg | ORAL_CAPSULE | Freq: Every day | ORAL | 3 refills | Status: DC

## 2017-04-06 NOTE — Patient Instructions (Signed)
Medication Instructions:   START DILTIAZEM 120 MG TAKE IN THE EVENING  Testing/Procedures:  Your physician has recommended that you have a Cardioversion (DCCV). Electrical Cardioversion uses a jolt of electricity to your heart either through paddles or wired patches attached to your chest. This is a controlled, usually prescheduled, procedure. Defibrillation is done under light anesthesia in the hospital, and you usually go home the day of the procedure. This is done to get your heart back into a normal rhythm. You are not awake for the procedure. Please see the instruction sheet given to you today.    Follow-Up:  Your physician wants you to follow-up in: 3 [place] will receive a reminder letter in the mail two months in advance. If you don't receive a letter, please call our office to schedule the follow-up appointment.   REFERRAL TO PULMONARY FOR SLEEP APNEA  If you need a refill on your cardiac medications before your next appointment, please call your pharmacy.

## 2017-04-16 ENCOUNTER — Ambulatory Visit (HOSPITAL_COMMUNITY): Payer: 59 | Admitting: Certified Registered"

## 2017-04-16 ENCOUNTER — Ambulatory Visit (HOSPITAL_COMMUNITY)
Admission: RE | Admit: 2017-04-16 | Discharge: 2017-04-16 | Disposition: A | Discharge status: Home / Self Care | Payer: 59 | Source: Ambulatory Visit | Attending: Cardiovascular Disease | Admitting: Cardiovascular Disease

## 2017-04-16 ENCOUNTER — Encounter (HOSPITAL_COMMUNITY): Payer: Self-pay | Admitting: *Deleted

## 2017-04-16 ENCOUNTER — Encounter (HOSPITAL_COMMUNITY)
Admission: RE | Disposition: A | Discharge status: Home / Self Care | Payer: Self-pay | Source: Ambulatory Visit | Attending: Cardiovascular Disease

## 2017-04-16 DIAGNOSIS — I10 Essential (primary) hypertension: Secondary | ICD-10-CM | POA: Insufficient documentation

## 2017-04-16 DIAGNOSIS — Z7901 Long term (current) use of anticoagulants: Secondary | ICD-10-CM | POA: Insufficient documentation

## 2017-04-16 DIAGNOSIS — I4819 Other persistent atrial fibrillation: Secondary | ICD-10-CM

## 2017-04-16 DIAGNOSIS — I480 Paroxysmal atrial fibrillation: Secondary | ICD-10-CM | POA: Diagnosis not present

## 2017-04-16 DIAGNOSIS — Z7952 Long term (current) use of systemic steroids: Secondary | ICD-10-CM | POA: Diagnosis not present

## 2017-04-16 DIAGNOSIS — F329 Major depressive disorder, single episode, unspecified: Secondary | ICD-10-CM | POA: Diagnosis not present

## 2017-04-16 DIAGNOSIS — J449 Chronic obstructive pulmonary disease, unspecified: Secondary | ICD-10-CM | POA: Insufficient documentation

## 2017-04-16 DIAGNOSIS — I340 Nonrheumatic mitral (valve) insufficiency: Secondary | ICD-10-CM | POA: Diagnosis not present

## 2017-04-16 DIAGNOSIS — E785 Hyperlipidemia, unspecified: Secondary | ICD-10-CM | POA: Insufficient documentation

## 2017-04-16 DIAGNOSIS — I481 Persistent atrial fibrillation: Secondary | ICD-10-CM | POA: Diagnosis not present

## 2017-04-16 DIAGNOSIS — I420 Dilated cardiomyopathy: Secondary | ICD-10-CM | POA: Diagnosis not present

## 2017-04-16 DIAGNOSIS — I428 Other cardiomyopathies: Secondary | ICD-10-CM | POA: Insufficient documentation

## 2017-04-16 HISTORY — PX: CARDIOVERSION: SHX1299

## 2017-04-16 SURGERY — CARDIOVERSION
Anesthesia: General

## 2017-04-16 MED ORDER — SODIUM CHLORIDE 0.9% FLUSH: 3.0000 mL | Freq: Two times a day (BID) | INTRAVENOUS | Status: DC

## 2017-04-16 MED ORDER — SODIUM CHLORIDE 0.9% FLUSH: 3.0000 mL | INTRAVENOUS | Status: DC | PRN

## 2017-04-16 MED ORDER — PROPOFOL 10 MG/ML IV BOLUS
INTRAVENOUS | Status: AC
  Filled 2017-04-16: qty 20

## 2017-04-16 MED ORDER — SODIUM CHLORIDE 0.9 % IV SOLN
250.0000 mL | INTRAVENOUS | Status: DC
  Administered 2017-04-16: 14:00:00 via INTRAVENOUS

## 2017-04-16 MED ORDER — SODIUM CHLORIDE 0.9 % IV SOLN: 250.0000 mL | INTRAVENOUS | Status: DC

## 2017-04-16 MED ORDER — LIDOCAINE 2% (20 MG/ML) 5 ML SYRINGE
INTRAMUSCULAR | Status: DC | PRN
  Administered 2017-04-16: 20 mg via INTRAVENOUS

## 2017-04-16 MED ORDER — LIDOCAINE 2% (20 MG/ML) 5 ML SYRINGE
INTRAMUSCULAR | Status: AC
  Filled 2017-04-16: qty 5

## 2017-04-16 MED ORDER — PROPOFOL 10 MG/ML IV BOLUS
INTRAVENOUS | Status: DC | PRN
  Administered 2017-04-16: 150 mg via INTRAVENOUS
  Administered 2017-04-16: 50 mg via INTRAVENOUS

## 2017-04-16 NOTE — Anesthesia Preprocedure Evaluation (Addendum)
Anesthesia Evaluation  Patient identified by MRN, date of birth, ID band Patient awake    Reviewed: Allergy & Precautions, NPO status , Patient's Chart, lab work & pertinent test results  History of Anesthesia Complications (+) PONV  Airway Mallampati: II  TM Distance: >3 FB Neck ROM: Full    Dental  (+) Dental Advisory Given, Chipped   Pulmonary COPD,  COPD inhaler,    Pulmonary exam normal        Cardiovascular hypertension, Pt. on medications (-) angina+ dysrhythmias Atrial Fibrillation  Rhythm:Regular Rate:Normal  9/18 ECHO: EF 60-65%, valves OK   Neuro/Psych negative neurological ROS     GI/Hepatic negative GI ROS, (+)     substance abuse (narcotic)  ,   Endo/Other  Morbid obesity  Renal/GU negative Renal ROS     Musculoskeletal negative musculoskeletal ROS (+)   Abdominal (+) + obese,   Peds  Hematology Xarelto   Anesthesia Other Findings   Reproductive/Obstetrics                            Anesthesia Physical Anesthesia Plan  ASA: III  Anesthesia Plan: General   Post-op Pain Management:    Induction: Intravenous  PONV Risk Score and Plan: Treatment may vary due to age or medical condition  Airway Management Planned: Natural Airway and Mask  Additional Equipment:   Intra-op Plan:   Post-operative Plan:   Informed Consent: I have reviewed the patients History and Physical, chart, labs and discussed the procedure including the risks, benefits and alternatives for the proposed anesthesia with the patient or authorized representative who has indicated his/her understanding and acceptance.   Dental advisory given  Plan Discussed with: CRNA and Surgeon  Anesthesia Plan Comments: (Plan routine monitors, GA for cardioversion)        Anesthesia Quick Evaluation

## 2017-04-16 NOTE — Transfer of Care (Signed)
Immediate Anesthesia Transfer of Care Note  Patient: [name]  Procedure(s) Performed: CARDIOVERSION (N/A )  Patient Location: Endoscopy Unit  Anesthesia Type:General  Level of Consciousness: drowsy and patient cooperative  Airway & Oxygen Therapy: Patient Spontanous Breathing  Post-op Assessment: Report given to RN  Post vital signs: Reviewed and stable  Last Vitals:  Vitals:   04/16/17 1322  BP: (!) 151/95  Pulse: (!) 111  Resp: 16  SpO2: 99%    Last Pain:  Vitals:   04/16/17 1322  TempSrc: Oral         Complications: No apparent anesthesia complications

## 2017-04-16 NOTE — H&P (View-Only) (Signed)
HPI: FU paroxysmal atrial fibrillation. The patient had a cardiac catheterization in 2004 that showed an ejection fraction of 45% and normal coronary arteries. He did have atrial fibrillation at that time and his LV function improved after sinus rhythm was restored. He ultimately had atrial fibrillation ablation. He did well for several years. However in August of 2010 he was admitted to [facility] with recurrent atrial fibrillation. An echocardiogram performed on August 11 of 2010 showed an ejection fraction of 35-40%. There was biatrial enlargement. There was at least moderate mitral regurgitation. The patient subsequently had a transesophageal echocardiogram that showed an ejection fraction of 35% and moderate mitral regurgitation. He underwent cardioversion at that time as there was no left atrial appendage thrombus. He was seen in followup by Dr. Rayann Heman and close followup was felt to be indicated. Possible attempt at another ablation could be considered in the future if his atrial fibrillation recurred.  Pt seen in office 03/23/17 with recurrent atrial fibrillation. Echo repeated 03/31/17 and showed normal LV function. Since last seen, patient has some fatigue with exertion. He also notes increased dyspnea on exertion. Occasional orthopnea. No PND, palpitations, chest pain or syncope. No bleeding.  Current Outpatient Prescriptions  Medication Sig Dispense Refill  . acetaminophen (TYLENOL) 325 MG tablet Take 650 mg by mouth every 6 (six) hours as needed. For pain    . ALPRAZolam (XANAX) 0.5 MG tablet Take 0.5 mg by mouth at bedtime as needed for anxiety.    . cyclobenzaprine (FLEXERIL) 10 MG tablet Take 10 mg by mouth 3 (three) times daily as needed for muscle spasms.    Marland Kitchen diltiazem (TAZTIA XT) 360 MG 24 hr capsule Take 360 mg by mouth every morning.    . DULoxetine (CYMBALTA) 30 MG capsule Take 30 mg by mouth daily.      . furosemide (LASIX) 20 MG tablet Take 1 tablet (20 mg total) by  mouth daily. 90 tablet 3  . lisinopril (PRINIVIL,ZESTRIL) 40 MG tablet Take 1 tablet (40 mg total) by mouth daily. 30 tablet 12  . montelukast (SINGULAIR) 10 MG tablet Take 10 mg by mouth at bedtime as needed. Allergies.    . predniSONE (DELTASONE) 20 MG tablet Take 20 mg by mouth as needed (inflamation).    . rivaroxaban (XARELTO) 20 MG TABS tablet Take 1 tablet (20 mg total) by mouth daily with supper. 90 tablet 3  . sodium chloride (OCEAN) 0.65 % nasal spray Place 1 spray into the nose as needed for congestion.    . traMADol (ULTRAM) 50 MG tablet Take 50 mg by mouth every 8 (eight) hours as needed for severe pain.     No current facility-administered medications for this visit.      Past Medical History:  Diagnosis Date  . Allergic rhinitis   . Asthma    as a child  . Complication of anesthesia    "hard to put asleep, hard to wake up, nausea and vomiting"  . Depression   . History of echocardiogram    Echocardiogram 9/18: EF 60-65, normal wall motion  . HLD (hyperlipidemia)   . HTN (hypertension)    sees Dr. [name], [facility]  . Hypertrophy of prostate with urinary obstruction and other lower urinary tract symptoms (LUTS)   . Inguinal hernia without mention of obstruction or gangrene, unilateral or unspecified, (not specified as recurrent)   . Lumbar herniated disc    L7  . Narcotic addiction ([place])   .  NICM (nonischemic cardiomyopathy) ([place])   . PAF (paroxysmal atrial fibrillation) ([place])    ablation done 03/2006, sees Dr. Ginger Organ, [facility]  . Pneumonia    hx of 2004  . PONV (postoperative nausea and vomiting)     Past Surgical History:  Procedure Laterality Date  . ATRIAL ABLATION SURGERY  2007   [facility]  . CARDIOVERSION  08/11/2011   Procedure: CARDIOVERSION;  Surgeon: Lelon Perla, MD;  Location: [facility];  Service: Cardiovascular;  Laterality: N/A;  . EYE SURGERY     [place], 1998  . HERNIA REPAIR     1977  . INGUINAL HERNIA REPAIR Right 08/26/2012    Procedure: LAPAROSCOPIC INGUINAL HERNIA;  Surgeon: Madilyn Hook, DO;  Location: [place];  Service: General;  Laterality: Right;  laparoscopic right inguinal hernia repair with mesh, umbilical hernia repair  . INSERTION OF MESH Right 08/26/2012   Procedure: INSERTION OF MESH;  Surgeon: Madilyn Hook, DO;  Location: [place];  Service: General;  Laterality: Right;  right inguinal hernia  . UMBILICAL HERNIA REPAIR N/A 08/26/2012   Procedure: HERNIA REPAIR UMBILICAL ADULT;  Surgeon: Madilyn Hook, DO;  Location: [place];  Service: General;  Laterality: N/A;    Social History   Social History  . Marital status: Married    Spouse name: N/A  . Number of children: N/A  . Years of education: N/A   Occupational History  . owns landscaping business    Social History Main Topics  . Smoking status: Never Smoker  . Smokeless tobacco: Never Used  . Alcohol use No  . Drug use: No  . Sexual activity: Yes   Other Topics Concern  . Not on file   Social History Narrative  . No narrative on file    Family History  Problem Relation Age of Onset  . Asthma Mother   . Breast cancer Mother   . COPD Father   . Lymphoma Sister     ROS: no fevers or chills, productive cough, hemoptysis, dysphasia, odynophagia, melena, hematochezia, dysuria, hematuria, rash, seizure activity, orthopnea, PND, pedal edema, claudication. Remaining systems are negative.  Physical Exam: Well-developed well-nourished in no acute distress.  Skin is warm and dry.  HEENT is normal.  Neck is supple.  Chest is clear to auscultation with normal expansion.  Cardiovascular exam is irregular, tachycardic Abdominal exam nontender or distended. No masses palpated. Extremities show no edema. neuro grossly intact  ECG- Atrial fibrillation at a rate of 128. RV conduction delay. Nonspecific ST changes. personally reviewed  A/P  1 Persistent atrial fibrillation-Increase cardizem to 360 mg in am and 120 mg in PM. Continue xarelto  (initiated 9/17). We will arrange cardioversion at the end of next week. Continue xarelto afterwards indefinitely. If he has more frequent episodes in the future we can consider referral back for ablation or antiarrhythmic therapy.   2 hypertension-blood pressure is controlled. Continue present medications but increase Cardizem as outlined above for rate control..  3 History of cardiomyopathy-follow-up echocardiogram shows normal LV function. This is felt secondary to tachycardia in the past.   4 Snoring-I will arrange a pulmonary evaluation to rule out sleep apnea as a contributor to his atrial fibrillation.  Kirk Ruths, MD

## 2017-04-16 NOTE — Discharge Instructions (Signed)
Electrical Cardioversion, Care After °This sheet gives you information about how to care for yourself after your procedure. Your health care provider may also give you more specific instructions. If you have problems or questions, contact your health care provider. °What can I expect after the procedure? °After the procedure, it is common to have: °· Some redness on the skin where the shocks were given. ° °Follow these instructions at home: °· Do not drive for 24 hours if you were given a medicine to help you relax (sedative). °· Take over-the-counter and prescription medicines only as told by your health care provider. °· Ask your health care provider how to check your pulse. Check it often. °· Rest for 48 hours after the procedure or as told by your health care provider. °· Avoid or limit your caffeine use as told by your health care provider. °Contact a health care provider if: °· You feel like your heart is beating too quickly or your pulse is not regular. °· You have a serious muscle cramp that does not go away. °Get help right away if: °· You have discomfort in your chest. °· You are dizzy or you feel faint. °· You have trouble breathing or you are short of breath. °· Your speech is slurred. °· You have trouble moving an arm or leg on one side of your body. °· Your fingers or toes turn cold or blue. °This information is not intended to replace advice given to you by your health care provider. Make sure you discuss any questions you have with your health care provider. °Document Released: 04/13/2013 Document Revised: 01/25/2016 Document Reviewed: 12/28/2015 °Elsevier Interactive Patient Education © 2018 [employer] ° °

## 2017-04-16 NOTE — Op Note (Signed)
Procedure: Electrical Cardioversion Indications:  Atrial Fibrillation  Procedure Details:  Consent: Risks of procedure as well as the alternatives and risks of each were explained to the (patient/caregiver).  Consent for procedure obtained.  Time Out: Verified patient identification, verified procedure, site/side was marked, verified correct patient position, special equipment/implants available, medications/allergies/relevent history reviewed, required imaging and test results available.  Performed  Patient placed on cardiac monitor, pulse oximetry, supplemental oxygen as necessary.  Sedation given: propofol  200 mg IV , Anesthesiology, Dr. Glennon Mac Pacer pads placed anterior and posterior chest.  Cardioverted 3 time(s). First 2 shocks at 150J and 200J w pressure on pad unsuccessful Cardioversion with second  synchronized biphasic 200J shock and pressure on pad.  Evaluation: Findings: Post procedure EKG shows: NSR Complications: None Patient did tolerate procedure well.  Time Spent Directly with the Patient:  30 minutes   Dragan Tamburrino 04/16/2017, 1:44 PM

## 2017-04-16 NOTE — Interval H&P Note (Signed)
History and Physical Interval Note:  04/16/2017 1:25 PM  [name]  has presented today for surgery, with the diagnosis of AFIB  The various methods of treatment have been discussed with the patient and family. After consideration of risks, benefits and other options for treatment, the patient has consented to  Procedure(s): CARDIOVERSION (N/A) as a surgical intervention .  The patient's history has been reviewed, patient examined, no change in status, stable for surgery.  I have reviewed the patient's chart and labs.  Questions were answered to the patient's satisfaction.     Malaisha Silliman

## 2017-04-16 NOTE — Anesthesia Postprocedure Evaluation (Signed)
Anesthesia Post Note  Patient: [name]  Procedure(s) Performed: CARDIOVERSION (N/A )     Patient location during evaluation: Endoscopy Anesthesia Type: General Level of consciousness: awake and alert, patient cooperative and oriented Pain management: pain level controlled Vital Signs Assessment: post-procedure vital signs reviewed and stable Respiratory status: spontaneous breathing, respiratory function stable and nonlabored ventilation Cardiovascular status: blood pressure returned to baseline and stable Postop Assessment: no apparent nausea or vomiting Anesthetic complications: no    Last Vitals:  Vitals:   04/16/17 1322 04/16/17 1405  BP: (!) 151/95 (!) 124/95  Pulse: (!) 111 85  Resp: 16 13  SpO2: 99% 94%    Last Pain:  Vitals:   04/16/17 1322  TempSrc: Oral                 Ayrabella Labombard,E. Kaity Pitstick

## 2017-04-17 ENCOUNTER — Encounter (HOSPITAL_COMMUNITY): Payer: Self-pay | Admitting: Cardiovascular Disease

## 2017-04-30 ENCOUNTER — Ambulatory Visit: Payer: 59 | Admitting: Cardiology

## 2017-05-20 ENCOUNTER — Ambulatory Visit: Payer: 59 | Admitting: Internal Medicine

## 2017-05-21 ENCOUNTER — Encounter: Payer: Self-pay | Admitting: Internal Medicine

## 2017-08-14 DIAGNOSIS — L723 Sebaceous cyst: Secondary | ICD-10-CM | POA: Diagnosis not present

## 2017-08-14 DIAGNOSIS — G8929 Other chronic pain: Secondary | ICD-10-CM | POA: Diagnosis not present

## 2017-08-14 DIAGNOSIS — L219 Seborrheic dermatitis, unspecified: Secondary | ICD-10-CM | POA: Diagnosis not present

## 2017-09-22 DIAGNOSIS — Z125 Encounter for screening for malignant neoplasm of prostate: Secondary | ICD-10-CM | POA: Diagnosis not present

## 2017-09-22 DIAGNOSIS — I10 Essential (primary) hypertension: Secondary | ICD-10-CM | POA: Diagnosis not present

## 2017-09-22 DIAGNOSIS — E782 Mixed hyperlipidemia: Secondary | ICD-10-CM | POA: Diagnosis not present

## 2017-09-22 DIAGNOSIS — K219 Gastro-esophageal reflux disease without esophagitis: Secondary | ICD-10-CM | POA: Diagnosis not present

## 2017-12-05 DIAGNOSIS — Z23 Encounter for immunization: Secondary | ICD-10-CM | POA: Diagnosis not present

## 2017-12-08 DIAGNOSIS — Z7901 Long term (current) use of anticoagulants: Secondary | ICD-10-CM | POA: Diagnosis not present

## 2017-12-08 DIAGNOSIS — Z1211 Encounter for screening for malignant neoplasm of colon: Secondary | ICD-10-CM | POA: Diagnosis not present

## 2017-12-10 ENCOUNTER — Telehealth: Payer: Self-pay

## 2017-12-10 NOTE — Telephone Encounter (Signed)
   [facility] Pre-operative Risk Assessment    Request for surgical clearance:  1. What type of surgery is being performed? Screening colonoscopy  2. When is this surgery scheduled? 12/30/17  3. What type of clearance is required (medical clearance vs. Pharmacy clearance to hold med vs. Both)? Both  4. Are there any medications that need to be held prior to surgery and how long? Xarelto  5. Practice name and name of physician performing surgery? [facility]   6. What is your office phone number [phone]   7.   What is your office fax number [phone]  8.   Anesthesia type (None, local, MAC, general) ? Karie Soda 12/10/2017, 5:29 PM  _________________________________________________________________   (provider comments below)

## 2017-12-11 NOTE — Telephone Encounter (Signed)
   Primary Cardiologist: Dr Stanford Breed  Chart reviewed as part of pre-operative protocol coverage. Because of [name]'s past medical history and time since last visit, he/she will require a follow-up visit in order to better assess preoperative cardiovascular risk.  Pre-op covering staff: - Please schedule appointment with Dr Stanford Breed or an APP at [facility] and call patient to inform them. - Please contact requesting surgeon's office via preferred method (i.e, phone, fax) to inform them of need for appointment prior to surgery.  Kerin Ransom, PA-C  12/11/2017, 11:15 AM

## 2017-12-28 ENCOUNTER — Telehealth: Payer: Self-pay | Admitting: Cardiology

## 2017-12-28 NOTE — Telephone Encounter (Signed)
   Primary Cardiologist: Kirk Ruths, MD  Chart reviewed as part of pre-operative protocol coverage.   Call back staff: Letter sent to surgeon.  Please contact surgeon's office to make sure letter received. Note will be removed from preop pool  History of persistent AFib, s/p prior PVI ablation, tachycardia mediated cardiomyopathy with improved EF while in NSR.  Last seen by Dr. Stanford Breed in 04/2017.  Patient underwent DCCV after that visit.  Echo in 9/18 demonstrated normal EF.  He is on Xarelto for anticoagulation.  I contacted the patient today by phone.  Since his last visit, he has done well without chest pain or shortness of breath.  He is symptomatic with atrial fibrillation and has had no symptoms of atrial fibrillation since his last DCCV.  He needs a colonoscopy.  His last dose of Xarelto was Saturday 6/22.  His procedure is Wed 6/26.  CHADS2-VASc=2 (HTN, CHF).  No hx of CVA.  RCRI:  0.9%. He may proceed with his colonoscopy at acceptable risk.  Holding Xarelto for 48 hours would have been adequate but since he did not take yesterday, he can hold until after his procedure.  He should resume Xarelto after his procedure as soon as it is safe.    Richardson Dopp, PA-C 12/28/2017, 3:39 PM

## 2017-12-28 NOTE — Telephone Encounter (Signed)
New Message:          [facility] Pre-operative Risk Assessment    Request for surgical clearance:  1. What type of surgery is being performed? colonoscopy  2. When is this surgery scheduled? 12/30/17   3. What type of clearance is required (medical clearance vs. Pharmacy clearance to hold med vs. Both)? Medical  4. Are there any medications that need to be held prior to surgery and how long?Xarelto has already been stopped per request   5. Practice name and name of physician performing surgery? Dr. Leanora Ivanoff   6. What is your office phone number [phone]   7.   What is your office fax number [phone]  8.   Anesthesia type (None, local, MAC, general) ? General  Pt is due to start the process tonight for the colonoscopy for the procedure on 12/30/17. Pt and office needs to know clearance decision as soon as possible.   [name] 12/28/2017, 2:28 PM  _________________________________________________________________   (provider comments below)

## 2017-12-28 NOTE — Telephone Encounter (Signed)
I tried to reach Dr. Erlinda Hong office with [name] to verify if clearance was received. No one answered the phone. I will fax over a hard copy to Dr. Erlinda Hong office fax # [phone]

## 2017-12-30 DIAGNOSIS — Z1211 Encounter for screening for malignant neoplasm of colon: Secondary | ICD-10-CM | POA: Diagnosis not present

## 2017-12-30 DIAGNOSIS — D126 Benign neoplasm of colon, unspecified: Secondary | ICD-10-CM | POA: Diagnosis not present

## 2018-01-12 DIAGNOSIS — K625 Hemorrhage of anus and rectum: Secondary | ICD-10-CM | POA: Diagnosis not present

## 2018-02-23 DIAGNOSIS — L723 Sebaceous cyst: Secondary | ICD-10-CM | POA: Diagnosis not present

## 2018-02-23 DIAGNOSIS — G894 Chronic pain syndrome: Secondary | ICD-10-CM | POA: Diagnosis not present

## 2018-02-23 DIAGNOSIS — M549 Dorsalgia, unspecified: Secondary | ICD-10-CM | POA: Diagnosis not present

## 2018-05-26 DIAGNOSIS — M79604 Pain in right leg: Secondary | ICD-10-CM | POA: Diagnosis not present

## 2018-05-26 DIAGNOSIS — S8991XA Unspecified injury of right lower leg, initial encounter: Secondary | ICD-10-CM | POA: Diagnosis not present

## 2018-05-26 DIAGNOSIS — W1809XA Striking against other object with subsequent fall, initial encounter: Secondary | ICD-10-CM | POA: Diagnosis not present

## 2018-07-18 ENCOUNTER — Encounter (HOSPITAL_BASED_OUTPATIENT_CLINIC_OR_DEPARTMENT_OTHER): Payer: Self-pay | Admitting: *Deleted

## 2018-07-18 ENCOUNTER — Emergency Department (HOSPITAL_BASED_OUTPATIENT_CLINIC_OR_DEPARTMENT_OTHER)
Admission: EM | Admit: 2018-07-18 | Discharge: 2018-07-18 | Disposition: A | Discharge status: Home / Self Care | Payer: 59 | Attending: Emergency Medicine | Admitting: Emergency Medicine

## 2018-07-18 ENCOUNTER — Emergency Department (HOSPITAL_COMMUNITY)
Admission: EM | Admit: 2018-07-18 | Discharge: 2018-07-18 | Disposition: A | Discharge status: Home / Self Care | Payer: 59 | Source: Home / Self Care

## 2018-07-18 ENCOUNTER — Other Ambulatory Visit: Payer: Self-pay

## 2018-07-18 ENCOUNTER — Encounter (HOSPITAL_COMMUNITY): Payer: Self-pay | Admitting: Emergency Medicine

## 2018-07-18 DIAGNOSIS — Z7901 Long term (current) use of anticoagulants: Secondary | ICD-10-CM | POA: Diagnosis not present

## 2018-07-18 DIAGNOSIS — M549 Dorsalgia, unspecified: Secondary | ICD-10-CM

## 2018-07-18 DIAGNOSIS — Z79899 Other long term (current) drug therapy: Secondary | ICD-10-CM | POA: Insufficient documentation

## 2018-07-18 DIAGNOSIS — G4700 Insomnia, unspecified: Secondary | ICD-10-CM | POA: Insufficient documentation

## 2018-07-18 DIAGNOSIS — J45909 Unspecified asthma, uncomplicated: Secondary | ICD-10-CM | POA: Insufficient documentation

## 2018-07-18 DIAGNOSIS — I480 Paroxysmal atrial fibrillation: Secondary | ICD-10-CM | POA: Diagnosis not present

## 2018-07-18 DIAGNOSIS — Z5321 Procedure and treatment not carried out due to patient leaving prior to being seen by health care provider: Secondary | ICD-10-CM

## 2018-07-18 DIAGNOSIS — Z765 Malingerer [conscious simulation]: Secondary | ICD-10-CM | POA: Insufficient documentation

## 2018-07-18 DIAGNOSIS — I10 Essential (primary) hypertension: Secondary | ICD-10-CM | POA: Insufficient documentation

## 2018-07-18 DIAGNOSIS — R110 Nausea: Secondary | ICD-10-CM | POA: Diagnosis not present

## 2018-07-18 MED ORDER — HALOPERIDOL LACTATE 5 MG/ML IJ SOLN
2.0000 mg | Freq: Once | INTRAMUSCULAR | Status: AC
  Administered 2018-07-18: 2 mg via INTRAMUSCULAR
  Filled 2018-07-18: qty 1

## 2018-07-18 NOTE — ED Provider Notes (Signed)
[facility] EMERGENCY DEPARTMENT Provider Note   CSN: [id number] Arrival date & time: 07/18/18  0002     History   Chief Complaint Chief Complaint  Patient presents with  . Insomnia  . Panic Attack    HPI [name] is a 63 y.o. male.  The history is provided by the patient and the spouse.  Insomnia  This is a new problem. The current episode started yesterday. The problem occurs constantly. The problem has not changed since onset.Pertinent negatives include no chest pain, no abdominal pain, no headaches and no shortness of breath. Nothing aggravates the symptoms. Nothing relieves the symptoms. Treatments tried: Xanax, melatonin, his last dose of tramadol for the month. The treatment provided no relief.  Patient states he can't sleep and and took the last of his monthly supply of tramadol for chronic pain, xanax and melatonin and cannot sleep.  He states he takes tramadol multiple times a day because he had a fall many years ago and broke every rib and has chronic right hip nerve pain that he has to have that medication for and it is unfair that on the last day of the cycle the last dose is at 8 pm but he cannot fill it until noon.  Moreover, he reports someone gave him an RX for [place] today and he thas taken this at 3 pm and he still can't sleep.  He says he is short tramadol and is anxious that he can't get the meds until 12 pm and he should be able to have medication all night the way he does during the month.  No cp, no SOB.    Past Medical History:  Diagnosis Date  . Allergic rhinitis   . Asthma    as a child  . Complication of anesthesia    "hard to put asleep, hard to wake up, nausea and vomiting"  . Depression   . History of echocardiogram    Echocardiogram 9/18: EF 60-65, normal wall motion  . HLD (hyperlipidemia)   . HTN (hypertension)    sees Dr. [name], [facility]  . Hypertrophy of prostate with urinary obstruction and other lower  urinary tract symptoms (LUTS)   . Inguinal hernia without mention of obstruction or gangrene, unilateral or unspecified, (not specified as recurrent)   . Lumbar herniated disc    L7  . Narcotic addiction ([place])   . NICM (nonischemic cardiomyopathy) ([place])   . PAF (paroxysmal atrial fibrillation) ([place])    ablation done 03/2006, sees Dr. Ginger Organ, [facility]  . Pneumonia    hx of 2004  . PONV (postoperative nausea and vomiting)     Patient Active Problem List   Diagnosis Date Noted  . Syncope 11/15/2015  . Encounter for therapeutic drug monitoring 08/02/2013  . Concussion 01/13/2013  . Fall 01/13/2013  . Multiple fractures of ribs of left side 01/13/2013  . Traumatic pneumothorax 01/13/2013  . Long term current use of anticoagulant 09/18/2010  . MITRAL INSUFFICIENCY 03/14/2009  . Dilated cardiomyopathy ([place]) 03/14/2009  . DEPRESSION 02/13/2009  . ALLERGIC RHINITIS 02/13/2009  . HYPERLIPIDEMIA 02/12/2009  . PAROXYSMAL ATRIAL FIBRILLATION 02/12/2009    Past Surgical History:  Procedure Laterality Date  . ATRIAL ABLATION SURGERY  2007   [facility]  . CARDIOVERSION  08/11/2011   Procedure: CARDIOVERSION;  Surgeon: Lelon Perla, MD;  Location: [facility];  Service: Cardiovascular;  Laterality: N/A;  . CARDIOVERSION N/A 04/16/2017   Procedure: CARDIOVERSION;  Surgeon: Sanda Klein, MD;  Location: [facility];  Service: Cardiovascular;  Laterality: N/A;  . EYE SURGERY     [place], 1998  . HERNIA REPAIR     1977  . INGUINAL HERNIA REPAIR Right 08/26/2012   Procedure: LAPAROSCOPIC INGUINAL HERNIA;  Surgeon: Madilyn Hook, DO;  Location: [facility];  Service: General;  Laterality: Right;  laparoscopic right inguinal hernia repair with mesh, umbilical hernia repair  . INSERTION OF MESH Right 08/26/2012   Procedure: INSERTION OF MESH;  Surgeon: Madilyn Hook, DO;  Location: [facility];  Service: General;  Laterality: Right;  right inguinal hernia  . UMBILICAL HERNIA REPAIR N/A 08/26/2012   Procedure:  HERNIA REPAIR UMBILICAL ADULT;  Surgeon: Madilyn Hook, DO;  Location: [facility];  Service: General;  Laterality: N/A;        Home Medications    Prior to Admission medications   Medication Sig Start Date End Date Taking? Authorizing Provider  acetaminophen (TYLENOL) 325 MG tablet Take 650 mg by mouth every 6 (six) hours as needed. For pain    [provider]  ALPRAZolam (XANAX) 0.5 MG tablet Take 0.5 mg by mouth at bedtime as needed for anxiety.    [provider]  cyclobenzaprine (FLEXERIL) 10 MG tablet Take 10 mg by mouth 3 (three) times daily as needed for muscle spasms.    [provider]  diltiazem (CARDIZEM CD) 120 MG 24 hr capsule Take 1 capsule (120 mg total) by mouth at bedtime. 04/06/17 07/05/17  Lelon Perla, MD  diltiazem (TAZTIA XT) 360 MG 24 hr capsule Take 360 mg by mouth every morning.    [provider]  DULoxetine (CYMBALTA) 30 MG capsule Take 30 mg by mouth daily.      [provider]  furosemide (LASIX) 20 MG tablet Take 1 tablet (20 mg total) by mouth daily. 03/23/17 06/21/17  Richardson Dopp T, PA-C  lisinopril (PRINIVIL,ZESTRIL) 40 MG tablet Take 1 tablet (40 mg total) by mouth daily. 03/31/12   Lelon Perla, MD  montelukast (SINGULAIR) 10 MG tablet Take 10 mg by mouth at bedtime as needed. Allergies.    [provider]  predniSONE (DELTASONE) 20 MG tablet Take 20 mg by mouth as needed (inflamation).    [provider]  rivaroxaban (XARELTO) 20 MG TABS tablet Take 1 tablet (20 mg total) by mouth daily with supper. 03/23/17   Richardson Dopp T, PA-C  sodium chloride (OCEAN) 0.65 % nasal spray Place 1 spray into the nose as needed for congestion.    [provider]  traMADol (ULTRAM) 50 MG tablet Take 50 mg by mouth every 6 (six) hours as needed for severe pain.     [provider]    Family History Family History  Problem Relation Age of Onset  . Asthma Mother   . Breast cancer Mother     . COPD Father   . Lymphoma Sister     Social History Social History   Tobacco Use  . Smoking status: Never Smoker  . Smokeless tobacco: Never Used  Substance Use Topics  . Alcohol use: No    Alcohol/week: 0.0 standard drinks  . Drug use: No     Allergies   Patient has no known allergies.   Review of Systems Review of Systems  Constitutional: Negative for fever.  Respiratory: Negative for shortness of breath.   Cardiovascular: Negative for chest pain.  Gastrointestinal: Negative for abdominal pain.  Neurological: Negative for headaches.  Psychiatric/Behavioral: Negative for behavioral problems, confusion and self-injury. The  patient is nervous/anxious and has insomnia.   All other systems reviewed and are negative.    Physical Exam Updated Vital Signs BP (!) 156/92 (BP Location: Right Arm)   Pulse 78   Temp 98.5 F (36.9 C) (Oral)   Resp (!) 28   Ht 6\' 4"  (1.93 m)   Wt 124.7 kg   SpO2 98%   BMI 33.47 kg/m   Physical Exam Vitals signs and nursing note reviewed.  Constitutional:      Appearance: Normal appearance. He is obese.  HENT:     Head: Normocephalic.     Nose: Nose normal.     Mouth/Throat:     Pharynx: Oropharynx is clear.  Eyes:     Extraocular Movements: Extraocular movements intact.     Conjunctiva/sclera: Conjunctivae normal.     Pupils: Pupils are equal, round, and reactive to light.  Neck:     Musculoskeletal: Normal range of motion and neck supple.  Cardiovascular:     Rate and Rhythm: Normal rate and regular rhythm.     Pulses: Normal pulses.     Heart sounds: Normal heart sounds.  Pulmonary:     Effort: Pulmonary effort is normal.     Breath sounds: Normal breath sounds.  Abdominal:     General: Bowel sounds are normal.     Tenderness: There is no abdominal tenderness.  Musculoskeletal: Normal range of motion.  Skin:    General: Skin is warm and dry.     Capillary Refill: Capillary refill takes less than 2 seconds.   Neurological:     General: No focal deficit present.     Mental Status: He is alert and oriented to person, place, and time.  Psychiatric:        Mood and Affect: Mood is anxious.      ED Treatments / Results  Labs (all labs ordered are listed, but only abnormal results are displayed) Labs Reviewed - No data to display  EKG None  Radiology No results found.  Procedures Procedures (including critical care time)  Medications Ordered in ED Medications  haloperidol lactate (HALDOL) injection 2 mg (2 mg Intramuscular Given 07/18/18 0140)      DEA database reviewed and patient receives 240 tramadol every months and is not due until 07/19/2018 to receive another refill, he receives xanax monthly.    EDP stated politely that we we do not have a full pixis with other sleeping medication. We cannot refill his narcotic medication as it is department policy. EDP stated we could give him benadryl to sleep or we could give him a dose of IM haldol which usually makes patients sleepy and would help with his chronic pain but it would not happen immediately and we would watch him for at least 30-45 minutes after the medication was administered.    Patient was initially amenable to this plan.  Medication was administered and I was informed by nurse patient was ready to leave.  Then informed that the patient said we did nothing for him.   Final Clinical Impressions(s) / ED Diagnoses   Final diagnoses:  Insomnia, unspecified type   I strongly suspect drug seeking behavior as patient ran out of his tramadol 2 days too early and he receives 240 pills a month.  I was honest with the patient that we do not refill these medications.  Follow up with your PMD will restart metformin. Ruled out for MI in the ED.   Return for pain, intractable cough, productive cough,fevers >  100.4 unrelieved by medication, shortness of breath, intractable vomiting, or diarrhea, abdominal pain, Inability to tolerate  liquids or food, cough, altered mental status or any concerns. No signs of systemic illness or infection. The patient is nontoxic-appearing on exam and vital signs are within normal limits.   I have reviewed the triage vital signs and the nursing notes. Pertinent labs &imaging results that were available during my care of the patient were reviewed by me and considered in my medical decision making (see chart for details).  After history, exam, and medical workup I feel the patient has been appropriately medically screened and is safe for discharge home. Pertinent diagnoses were discussed with the patient. Patient was given return precautions.      Geneieve Duell, MD 07/18/18 0230

## 2018-07-18 NOTE — ED Notes (Signed)
When attempting to shut pt's door, pt stated "Well I'm just going to keep screaming until someone comes and helps me." Pt began to scream. Will continue to monitor.

## 2018-07-18 NOTE — ED Notes (Signed)
Patient made aware we needed a urine sample. He ambulated to the RR to void, and after he flushed and exited the RR he said "I could not give you a sample, I am sorry."

## 2018-07-18 NOTE — ED Triage Notes (Addendum)
Pt reports he hasn't slept in over 24 hours. Reports panic attacks since noon. Hyperventilating. States he has been on tramadol "for years" but he ran out at midnight and can't pick up his Rx until the morning. States he has taken xanax today but it's not helping

## 2018-07-18 NOTE — ED Notes (Signed)
Pt encouraged to rest with lights down. Wife at bedside.

## 2018-07-18 NOTE — ED Notes (Signed)
Pt constantly walking to nurse's desk and his room groaning saying "please help me." Pt advised to stay around his room. Pt is ambulating without assistance and claiming his pain is a 10/10.

## 2018-07-18 NOTE — ED Triage Notes (Signed)
-  C/C chronic back pain. Went to [facility] yesterday b/c he ran out of his meds, they gave him a refill and he did not have a chance to get it filled. Anxious and restless during transport. Reports being up for 27 hours straight, EMS reports sleepy delirium. Ultram, patient reports taking Ambien and xanax sometime tonight.  -Vitals  -BP 150/90 -P 94 -O2 98% RA

## 2018-07-18 NOTE — ED Notes (Addendum)
Pt states that he is "ready to go" Dr. Randal Buba offered to observe patient longer, but he declined. Pt in his jacket and ready to leave. Did not wait to have vitals re-checked. Explained to pt that medication was just now entering his system and he did not need to drive home due to potential drowsiness.  Wife states that she is driving.

## 2018-07-18 NOTE — ED Notes (Signed)
ED Provider at bedside. 

## 2018-07-18 NOTE — ED Notes (Signed)
Bed: WA20 Expected date:  Expected time:  Means of arrival:  Comments: EMS 

## 2018-07-18 NOTE — ED Notes (Addendum)
Pt walked out of room and stated "there's no doctor out here." and walked back into room.

## 2018-09-27 DIAGNOSIS — E782 Mixed hyperlipidemia: Secondary | ICD-10-CM | POA: Diagnosis not present

## 2018-09-27 DIAGNOSIS — K219 Gastro-esophageal reflux disease without esophagitis: Secondary | ICD-10-CM | POA: Diagnosis not present

## 2018-09-27 DIAGNOSIS — I10 Essential (primary) hypertension: Secondary | ICD-10-CM | POA: Diagnosis not present

## 2018-10-04 DIAGNOSIS — G4700 Insomnia, unspecified: Secondary | ICD-10-CM | POA: Diagnosis not present

## 2018-10-04 DIAGNOSIS — R6889 Other general symptoms and signs: Secondary | ICD-10-CM | POA: Diagnosis not present

## 2018-10-30 DIAGNOSIS — M79651 Pain in right thigh: Secondary | ICD-10-CM | POA: Diagnosis not present

## 2018-11-09 ENCOUNTER — Telehealth: Payer: Self-pay | Admitting: Cardiology

## 2018-11-09 ENCOUNTER — Telehealth: Payer: Self-pay

## 2018-11-09 NOTE — Telephone Encounter (Signed)
New Message   Appointment changed to virtual appointment please call to setup.

## 2018-11-09 NOTE — Telephone Encounter (Signed)
Left a detailed message for the patient about his upcoming appointment and switching it to a virtual visit and that he can call our office and someone will be glad to assist him with switching his appointment

## 2018-11-10 ENCOUNTER — Telehealth (INDEPENDENT_AMBULATORY_CARE_PROVIDER_SITE_OTHER): Payer: 59 | Admitting: Cardiology

## 2018-11-10 DIAGNOSIS — Z539 Procedure and treatment not carried out, unspecified reason: Secondary | ICD-10-CM

## 2018-11-10 NOTE — Progress Notes (Signed)
No show [name]

## 2018-11-11 DIAGNOSIS — I10 Essential (primary) hypertension: Secondary | ICD-10-CM | POA: Diagnosis not present

## 2018-11-12 DIAGNOSIS — R030 Elevated blood-pressure reading, without diagnosis of hypertension: Secondary | ICD-10-CM | POA: Diagnosis not present

## 2018-11-17 ENCOUNTER — Telehealth: Payer: Self-pay | Admitting: Cardiology

## 2018-11-17 ENCOUNTER — Emergency Department (HOSPITAL_BASED_OUTPATIENT_CLINIC_OR_DEPARTMENT_OTHER): Payer: 59

## 2018-11-17 ENCOUNTER — Other Ambulatory Visit: Payer: Self-pay

## 2018-11-17 ENCOUNTER — Encounter (HOSPITAL_BASED_OUTPATIENT_CLINIC_OR_DEPARTMENT_OTHER): Payer: Self-pay | Admitting: Emergency Medicine

## 2018-11-17 ENCOUNTER — Emergency Department (HOSPITAL_BASED_OUTPATIENT_CLINIC_OR_DEPARTMENT_OTHER)
Admission: EM | Admit: 2018-11-17 | Discharge: 2018-11-17 | Disposition: A | Discharge status: Home / Self Care | Payer: 59 | Attending: Emergency Medicine | Admitting: Emergency Medicine

## 2018-11-17 DIAGNOSIS — R2242 Localized swelling, mass and lump, left lower limb: Secondary | ICD-10-CM | POA: Insufficient documentation

## 2018-11-17 DIAGNOSIS — M7989 Other specified soft tissue disorders: Secondary | ICD-10-CM

## 2018-11-17 DIAGNOSIS — R0602 Shortness of breath: Secondary | ICD-10-CM | POA: Diagnosis not present

## 2018-11-17 DIAGNOSIS — Z79899 Other long term (current) drug therapy: Secondary | ICD-10-CM | POA: Diagnosis not present

## 2018-11-17 DIAGNOSIS — I10 Essential (primary) hypertension: Secondary | ICD-10-CM | POA: Insufficient documentation

## 2018-11-17 LAB — BASIC METABOLIC PANEL
Anion gap: 5 (ref 5–15)
BUN: 20 mg/dL (ref 8–23)
CO2: 26 mmol/L (ref 22–32)
Calcium: 8.8 mg/dL — ABNORMAL LOW (ref 8.9–10.3)
Chloride: 106 mmol/L (ref 98–111)
Creatinine, Ser: 0.78 mg/dL (ref 0.61–1.24)
GFR calc Af Amer: >60 mL/min (ref >60)
GFR calc non Af Amer: >60 mL/min (ref >60)
Glucose, Bld: 127 mg/dL — ABNORMAL HIGH (ref 70–99)
Potassium: 4.1 mmol/L (ref 3.5–5.1)
Sodium: 137 mmol/L (ref 135–145)

## 2018-11-17 LAB — URINALYSIS, ROUTINE W REFLEX MICROSCOPIC
Bilirubin Urine: NEGATIVE
Glucose, UA: NEGATIVE mg/dL
Hgb urine dipstick: NEGATIVE
Ketones, ur: NEGATIVE mg/dL
Nitrite: NEGATIVE
Protein, ur: NEGATIVE mg/dL
Specific Gravity, Urine: 1.02 (ref 1.005–1.030)
pH: 6 (ref 5.0–8.0)

## 2018-11-17 LAB — CBC WITH DIFFERENTIAL/PLATELET
Abs Immature Granulocytes: 0.03 10*3/uL (ref 0.00–0.07)
Basophils Absolute: 0 10*3/uL (ref 0.0–0.1)
Basophils Relative: 0 %
Eosinophils Absolute: 0.3 10*3/uL (ref 0.0–0.5)
Eosinophils Relative: 4 %
HCT: 37.5 % — ABNORMAL LOW (ref 39.0–52.0)
Hemoglobin: 11.1 g/dL — ABNORMAL LOW (ref 13.0–17.0)
Immature Granulocytes: 0 %
Lymphocytes Relative: 26 %
Lymphs Abs: 2 10*3/uL (ref 0.7–4.0)
MCH: 25.4 pg — ABNORMAL LOW (ref 26.0–34.0)
MCHC: 29.6 g/dL — ABNORMAL LOW (ref 30.0–36.0)
MCV: 85.8 fL (ref 80.0–100.0)
Monocytes Absolute: 0.8 10*3/uL (ref 0.1–1.0)
Monocytes Relative: 10 %
Neutro Abs: 4.6 10*3/uL (ref 1.7–7.7)
Neutrophils Relative %: 60 %
Platelets: 393 10*3/uL (ref 150–400)
RBC: 4.37 MIL/uL (ref 4.22–5.81)
RDW: 14.1 % (ref 11.5–15.5)
WBC: 7.7 10*3/uL (ref 4.0–10.5)
nRBC: 0 % (ref 0.0–0.2)

## 2018-11-17 LAB — URINALYSIS, MICROSCOPIC (REFLEX)

## 2018-11-17 LAB — TROPONIN I: Troponin I: <0.03 ng/mL (ref <0.03)

## 2018-11-17 LAB — BRAIN NATRIURETIC PEPTIDE: B Natriuretic Peptide: 35.7 pg/mL (ref 0.0–100.0)

## 2018-11-17 MED ORDER — IOHEXOL 350 MG/ML SOLN
100.0000 mL | Freq: Once | INTRAVENOUS | Status: AC | PRN
  Administered 2018-11-17: 16:00:00 100 mL via INTRAVENOUS

## 2018-11-17 NOTE — Telephone Encounter (Signed)
Spoke with the pt and his wife and they are reporting that the pt is having SOB and BP still elevated the last few days..  199/123 178/101 168/92  They attribute his symptoms to the Lisinopril but in talking with them he sounded very SOB on the phone and when asked about peripheral edema... he reports that he fell back in 09/2018 and injured his ankle but never seen for it...  It has been extremely swollen per his wife and she says it looks "bad".. he denies redness and not sure if warm to touch but has pain in the entire leg especially his calf with ambulation. The swelling is up to his knee and mostly in his calf.   He has taken is Xarelto without missing any doses.   While I was on the phone he started to vomit. I asked if this was new and his wife says he has "not been right' for quite a while...especially the last 3 weeks. No fever and no interaction with anyone sick recently.   Advised them that I think he needs to be ASAP but they are insistent that I message Dr. Lyda Kalata.. will forward and will alert them to view message ASAP.

## 2018-11-17 NOTE — Discharge Instructions (Addendum)
You were seen in the ED today for shortness of breath, high blood pressure, and left leg swelling. Your DVT study was negative for a blood clot in your leg. Your CTA chest was also negative for a pulmonary embolism. Your blood pressure was consistently 150s/80-90s in the ED with your at home blood pressure monitor reading much higher; please obtain a new blood pressure monitor.   Follow up with your PCP Dr. Alyson Ingles as well as cardiologist Dr. Stanford Breed regarding your ED visit today. Return to the ED for any worsening symptoms.

## 2018-11-17 NOTE — Telephone Encounter (Signed)
Returned call to patient's wife.She stated her husband is presently at [facility].Stated he is having test.She is concerned his B/P is still elevated 157/98.She wanted Dr.Crenshaw to know and ask him if he needs to increase his medication.Message sent to [facility] for advice.

## 2018-11-17 NOTE — ED Notes (Signed)
Pt on monitor 

## 2018-11-17 NOTE — Telephone Encounter (Signed)
Spoke with pt wife, Aware of dr crenshaw's recommendations.  

## 2018-11-17 NOTE — ED Notes (Signed)
ED Provider at bedside. 

## 2018-11-17 NOTE — Progress Notes (Signed)
Virtual Visit via Video Note   This visit type was conducted due to national recommendations for restrictions regarding the COVID-19 Pandemic (e.g. social distancing) in an effort to limit this patient's exposure and mitigate transmission in our community.  Due to his co-morbid illnesses, this patient is at least at moderate risk for complications without adequate follow up.  This format is felt to be most appropriate for this patient at this time.  All issues noted in this document were discussed and addressed.  A limited physical exam was performed with this format.  Please refer to the patient's chart for his consent to telehealth for [facility].   Date:  11/19/2018   ID:  [name], DOB Dec 04, 1955, MRN [id number]  Patient Location: Home Provider Location: Home  PCP:  [name], MD  Cardiologist:  Kirk Ruths, MD   Evaluation Performed:  Follow-Up Visit  Chief Complaint:  FU atrial fibrillation  History of Present Illness:    FU paroxysmal atrial fibrillation. The patient had a cardiac catheterization in 2004 that showed an ejection fraction of 45% and normal coronary arteries. He did have atrial fibrillation at that time and his LV function improved after sinus rhythm was restored. He ultimately had atrial fibrillation ablation. He did well for several years. However in August of 2010 he was admitted to [facility] with recurrent atrial fibrillation. An echocardiogram performed on August 11 of 2010 showed an ejection fraction of 35-40%. There was biatrial enlargement. There was at least moderate mitral regurgitation. The patient subsequently had a transesophageal echocardiogram that showed an ejection fraction of 35% and moderate mitral regurgitation. He underwent cardioversion at that time as there was no left atrial appendage thrombus. He was seen in followup by Dr. Rayann Heman and close followup was felt to be indicated. Possible attempt at another ablation could  be considered in the future if his atrial fibrillation recurred.  Pt seen in office 03/23/17 with recurrent atrial fibrillation. Echo repeated 03/31/17 and showed normal LV function. Had repeat cardioversion October 2018.  Patient seen in the emergency room recently for dyspnea and elevated blood pressure.  CTA showed no pulmonary embolus. Venous Dopplers showed no DVT on the left.  BNP 35.  Troponin negative.  Hemoglobin 11.1.  Patient has been started on hydralazine for elevated blood pressure.  Since last seen,  patient states that his blood pressure is much improved on hydralazine.  He feels much better.  He denies dyspnea, chest pain, palpitations, syncope or pedal edema.  The patient does not have symptoms concerning for COVID-19 infection (fever, chills, cough, or new shortness of breath).    Past Medical History:  Diagnosis Date   Allergic rhinitis    Asthma    as a child   Complication of anesthesia    "hard to put asleep, hard to wake up, nausea and vomiting"   Depression    History of echocardiogram    Echocardiogram 9/18: EF 60-65, normal wall motion   HLD (hyperlipidemia)    HTN (hypertension)    sees Dr. [name], [facility]   Hypertrophy of prostate with urinary obstruction and other lower urinary tract symptoms (LUTS)    Inguinal hernia without mention of obstruction or gangrene, unilateral or unspecified, (not specified as recurrent)    Lumbar herniated disc    L7   Narcotic addiction ([place])    NICM (nonischemic cardiomyopathy) ([place])    PAF (paroxysmal atrial fibrillation) ([place])    ablation done 03/2006, sees Dr.  Ryan Stanford Breed, [place]   Pneumonia    hx of 2004   PONV (postoperative nausea and vomiting)    Past Surgical History:  Procedure Laterality Date   ATRIAL ABLATION SURGERY  2007   [place]   CARDIOVERSION  08/11/2011   Procedure: CARDIOVERSION;  Surgeon: Lelon Perla, MD;  Location: [place];  Service: Cardiovascular;  Laterality: N/A;     CARDIOVERSION N/A 04/16/2017   Procedure: CARDIOVERSION;  Surgeon: Sanda Klein, MD;  Location: [place] [facility];  Service: Cardiovascular;  Laterality: N/A;   EYE SURGERY     lasik, [place] Right 08/26/2012   Procedure: LAPAROSCOPIC INGUINAL HERNIA;  Surgeon: Madilyn Hook, DO;  Location: [place];  Service: General;  Laterality: Right;  laparoscopic right inguinal hernia repair with mesh, umbilical hernia repair   INSERTION OF MESH Right 08/26/2012   Procedure: INSERTION OF MESH;  Surgeon: Madilyn Hook, DO;  Location: [place];  Service: General;  Laterality: Right;  right inguinal hernia   UMBILICAL HERNIA REPAIR N/A 08/26/2012   Procedure: HERNIA REPAIR UMBILICAL ADULT;  Surgeon: Madilyn Hook, DO;  Location: [place];  Service: General;  Laterality: N/A;     Current Meds  Medication Sig   acetaminophen (TYLENOL) 325 MG tablet Take 650 mg by mouth every 6 (six) hours as needed. For pain   diltiazem (TAZTIA XT) 360 MG 24 hr capsule Take 360 mg by mouth every morning.   DULoxetine (CYMBALTA) 30 MG capsule Take 30 mg by mouth daily.     furosemide (LASIX) 20 MG tablet Take 1 tablet (20 mg total) by mouth daily.   hydrALAZINE (APRESOLINE) 25 MG tablet Take 1 tablet (25 mg total) by mouth 3 (three) times daily.   lisinopril (PRINIVIL,ZESTRIL) 40 MG tablet Take 1 tablet (40 mg total) by mouth daily.   predniSONE (DELTASONE) 20 MG tablet Take 20 mg by mouth as needed (inflamation).   rivaroxaban (XARELTO) 20 MG TABS tablet Take 1 tablet (20 mg total) by mouth daily with supper.   sodium chloride (OCEAN) 0.65 % nasal spray Place 1 spray into the nose as needed for congestion.     Allergies:   Patient has no known allergies.   Social History   Tobacco Use   Smoking status: Never Smoker   Smokeless tobacco: Never Used  Substance Use Topics   Alcohol use: No    Alcohol/week: 0.0 standard drinks   Drug use: No     Family Hx: The  patient's family history includes Asthma in his mother; Breast cancer in his mother; COPD in his father; Lymphoma in his sister.  ROS:   Please see the history of present illness.    No fevers, chills or productive cough. All other systems reviewed and are negative.   Recent Lipid Panel Lab Results  Component Value Date/Time   CHOL  02/14/2009 06:30 AM    126        ATP III CLASSIFICATION:  <200     mg/dL   Desirable  200-239  mg/dL   Borderline High  >=240    mg/dL   High          TRIG 95 02/14/2009 06:30 AM   HDL 48 02/14/2009 06:30 AM   CHOLHDL 2.6 02/14/2009 06:30 AM   LDLCALC  02/14/2009 06:30 AM    59        Total Cholesterol/HDL:CHD Risk Coronary Heart Disease Risk Table  Men   Women  1/2 Average Risk   3.4   3.3  Average Risk       5.0   4.4  2 X Average Risk   9.6   7.1  3 X Average Risk  23.4   11.0        Use the calculated Patient Ratio above and the CHD Risk Table to determine the patient's CHD Risk.        ATP III CLASSIFICATION (LDL):  <100     mg/dL   Optimal  100-129  mg/dL   Near or Above                    Optimal  130-159  mg/dL   Borderline  160-189  mg/dL   High  >190     mg/dL   Very High    Wt Readings from Last 3 Encounters:  11/19/18 297 lb (134.7 kg)  11/17/18 (!) 304 lb (137.9 kg)  07/18/18 275 lb (124.7 kg)    Electrocardiogram Nov 17, 2018 personally reviewed.  Normal sinus rhythm with no ST changes.  Objective:    Vital Signs:  BP 122/80    Pulse 73    Ht 6\' 5"  (1.956 m)    Wt 297 lb (134.7 kg)    BMI 35.22 kg/m    VITAL SIGNS:  reviewed  No acute distress Answers questions appropriately Normal affect Remainder of physical examination not performed (telehealth visit; coronavirus pandemic)  ASSESSMENT & PLAN:    1. Paroxysmal atrial fibrillation-recent electrocardiogram demonstrated sinus rhythm.  Continue present dose of Cardizem for rate control if atrial fibrillation recurs.  Continue Xarelto.  If he  has recurrent episodes in the future we can consider antiarrhythmic therapy versus referral for ablation.  He is having difficulty affording Xarelto.  We will try and see if patient assistance would be applicable either for Xarelto or apixaban.  Otherwise we will change to Coumadin. 2. History of cardiomyopathy-this was previously felt potentially to be tachycardia mediated.  Follow-up echocardiogram shows improved LV function. 3. Hypertension-patient's blood pressure has been elevated.  I added hydralazine 25 mg p.o. 3 times daily several days ago and his blood pressure is much improved.  We will follow and advance as needed. 4. Normocytic anemia-I have asked patient to follow-up with primary care concerning this issue.  COVID-19 Education: The importance of social distancing was discussed today.  Time:   Today, I have spent 15 minutes with the patient with telehealth technology discussing the above problems.     Medication Adjustments/Labs and Tests Ordered: Current medicines are reviewed at length with the patient today.  Concerns regarding medicines are outlined above.   Tests Ordered: No orders of the defined types were placed in this encounter.   Medication Changes: No orders of the defined types were placed in this encounter.   Disposition:  Follow up in 6 month(s)  Signed, Kirk Ruths, MD  11/19/2018 11:24 AM    [place]

## 2018-11-17 NOTE — ED Notes (Signed)
Attempted x 2 to start IV, obtain labs; unable to obtain

## 2018-11-17 NOTE — ED Triage Notes (Signed)
Pt reports elevated BP x several days; also reports SHOB, which he attributes to anxiety r/t BP; also c/o LT ankle pain and swelling s/p fall in March

## 2018-11-17 NOTE — Telephone Encounter (Signed)
Pt c/o Shortness Of Breath: STAT if SOB developed within the last 24 hours or pt is noticeably SOB on the phone  1. Are you currently SOB (can you hear that pt is SOB on the phone)? Yes   2. How long have you been experiencing SOB? 1 month   3. Are you SOB when sitting or when up moving around? Sitting  4. Are you currently experiencing any other symptoms? Can not breath well.

## 2018-11-17 NOTE — ED Provider Notes (Signed)
[facility] EMERGENCY DEPARTMENT Provider Note   CSN: [id number] Arrival date & time: 11/17/18  1212    History   Chief Complaint Chief Complaint  Patient presents with   Shortness of Breath   Hypertension    HPI [name] is a 63 y.o. male with PMHx HTN, HLD, A fib, and Nonischemic cardiomyopathy who presents to the ED complaining of gradual onset, constant, unchanged, shortness of breath x 2 days. He also complains of elevated BP at home with readings of 767 systolic for the past couple of days; pt had telemedicine visit with his PCP Dr. Alyson Ingles today given his elevated BP readings; but while talking with his PCP his blood pressure readings went back down to 341'P systolic. 156/82 in the ED today. While on the phone nursing staff noticed that pt sounded very short of breath and when asked about peripheral edema pt endorsed swelling to his left ankle since an injury in March. He states he tripped over something in his house and hit his ankle, causing gradual pain. The swelling has been present since and waxes and wanes in severity. Given his SOB and leg swelling pt was advised to come to the ED for further evaluation. Denies pain to his ankle or calf. Pt currently on Xarelto as well as Lasix; he reports full compliance without missed doses. No chest pain, cough, fever, nausea, vomiting, diaphoresis. No recent prolonged travel or immobilization; pt has been able to ambulate despite his injury in March. No hx DVT/PE. No active malignancy. No hemoptysis. Pt non smoker.        Past Medical History:  Diagnosis Date   Allergic rhinitis    Asthma    as a child   Complication of anesthesia    "hard to put asleep, hard to wake up, nausea and vomiting"   Depression    History of echocardiogram    Echocardiogram 9/18: EF 60-65, normal wall motion   HLD (hyperlipidemia)    HTN (hypertension)    sees Dr. [name], [facility]   Hypertrophy of prostate with  urinary obstruction and other lower urinary tract symptoms (LUTS)    Inguinal hernia without mention of obstruction or gangrene, unilateral or unspecified, (not specified as recurrent)    Lumbar herniated disc    L7   Narcotic addiction ([place])    NICM (nonischemic cardiomyopathy) ([place])    PAF (paroxysmal atrial fibrillation) ([place])    ablation done 03/2006, sees Dr. Ginger Organ, [place]   Pneumonia    hx of 2004   PONV (postoperative nausea and vomiting)     Patient Active Problem List   Diagnosis Date Noted   Syncope 11/15/2015   Encounter for therapeutic drug monitoring 08/02/2013   Concussion 01/13/2013   Fall 01/13/2013   Multiple fractures of ribs of left side 01/13/2013   Traumatic pneumothorax 01/13/2013   Long term current use of anticoagulant 09/18/2010   MITRAL INSUFFICIENCY 03/14/2009   Dilated cardiomyopathy ([place]) 03/14/2009   DEPRESSION 02/13/2009   ALLERGIC RHINITIS 02/13/2009   HYPERLIPIDEMIA 02/12/2009   PAROXYSMAL ATRIAL FIBRILLATION 02/12/2009    Past Surgical History:  Procedure Laterality Date   ATRIAL ABLATION SURGERY  2007   [facility]   CARDIOVERSION  08/11/2011   Procedure: CARDIOVERSION;  Surgeon: Lelon Perla, MD;  Location: [facility];  Service: Cardiovascular;  Laterality: N/A;   CARDIOVERSION N/A 04/16/2017   Procedure: CARDIOVERSION;  Surgeon: Sanda Klein, MD;  Location: [facility];  Service: Cardiovascular;  Laterality: N/A;  EYE SURGERY     las[place], 1998   HERNIA REPAIR     1977   INGUINAL HERNIA REPAIR Right 08/26/2012   Procedure: LAPAROSCOPIC INGUINAL HERNIA;  Surgeon: Madilyn Hook, DO;  Location: [facility];  Service: General;  Laterality: Right;  laparoscopic right inguinal hernia repair with mesh, umbilical hernia repair   INSERTION OF MESH Right 08/26/2012   Procedure: INSERTION OF MESH;  Surgeon: Madilyn Hook, DO;  Location: [facility];  Service: General;  Laterality: Right;  right inguinal hernia   UMBILICAL HERNIA  REPAIR N/A 08/26/2012   Procedure: HERNIA REPAIR UMBILICAL ADULT;  Surgeon: Madilyn Hook, DO;  Location: [facility];  Service: General;  Laterality: N/A;        Home Medications    Prior to Admission medications   Medication Sig Start Date End Date Taking? Authorizing Provider  acetaminophen (TYLENOL) 325 MG tablet Take 650 mg by mouth every 6 (six) hours as needed. For pain    [provider]  ALPRAZolam (XANAX) 0.5 MG tablet Take 0.5 mg by mouth at bedtime as needed for anxiety.    [provider]  cyclobenzaprine (FLEXERIL) 10 MG tablet Take 10 mg by mouth 3 (three) times daily as needed for muscle spasms.    [provider]  diltiazem (CARDIZEM CD) 120 MG 24 hr capsule Take 1 capsule (120 mg total) by mouth at bedtime. 04/06/17 07/05/17  Lelon Perla, MD  diltiazem (TAZTIA XT) 360 MG 24 hr capsule Take 360 mg by mouth every morning.    [provider]  DULoxetine (CYMBALTA) 30 MG capsule Take 30 mg by mouth daily.      [provider]  furosemide (LASIX) 20 MG tablet Take 1 tablet (20 mg total) by mouth daily. 03/23/17 06/21/17  Richardson Dopp T, PA-C  lisinopril (PRINIVIL,ZESTRIL) 40 MG tablet Take 1 tablet (40 mg total) by mouth daily. 03/31/12   Lelon Perla, MD  montelukast (SINGULAIR) 10 MG tablet Take 10 mg by mouth at bedtime as needed. Allergies.    [provider]  predniSONE (DELTASONE) 20 MG tablet Take 20 mg by mouth as needed (inflamation).    [provider]  rivaroxaban (XARELTO) 20 MG TABS tablet Take 1 tablet (20 mg total) by mouth daily with supper. 03/23/17   Richardson Dopp T, PA-C  sodium chloride (OCEAN) 0.65 % nasal spray Place 1 spray into the nose as needed for congestion.    [provider]  traMADol (ULTRAM) 50 MG tablet Take 50 mg by mouth every 6 (six) hours as needed for severe pain.     [provider]    Family History Family History  Problem Relation Age of Onset   Asthma  Mother    Breast cancer Mother    COPD Father    Lymphoma Sister     Social History Social History   Tobacco Use   Smoking status: Never Smoker   Smokeless tobacco: Never Used  Substance Use Topics   Alcohol use: No    Alcohol/week: 0.0 standard drinks   Drug use: No     Allergies   Patient has no known allergies.   Review of Systems Review of Systems  Constitutional: Negative for chills and fever.  HENT: Negative for congestion.   Eyes: Negative for redness.  Respiratory: Positive for shortness of breath. Negative for cough.   Cardiovascular: Positive for leg swelling. Negative for chest pain and palpitations.  Gastrointestinal: Negative for abdominal pain, nausea and vomiting.  Genitourinary:  Negative for dysuria and frequency.  Musculoskeletal: Negative for arthralgias, joint swelling and myalgias.  Skin: Negative for color change and rash.  Neurological: Negative for weakness and numbness.     Physical Exam Updated Vital Signs BP (!) 156/82    Pulse 76    Resp (!) 22    Ht 6\' 5"  (1.956 m)    Wt (!) 137.9 kg    SpO2 100%    BMI 36.05 kg/m   Physical Exam Vitals signs and nursing note reviewed.  Constitutional:      Appearance: He is not ill-appearing.  HENT:     Head: Normocephalic and atraumatic.  Eyes:     Conjunctiva/sclera: Conjunctivae normal.  Neck:     Musculoskeletal: Neck supple.  Cardiovascular:     Rate and Rhythm: Normal rate and regular rhythm.     Pulses: Normal pulses.     Heart sounds: No murmur.  Pulmonary:     Effort: Pulmonary effort is normal.     Breath sounds: Normal breath sounds. No decreased breath sounds, wheezing, rhonchi or rales.  Abdominal:     Palpations: Abdomen is soft.     Tenderness: There is no abdominal tenderness.  Musculoskeletal:     Comments: 1+ pitting edema to LLE mostly around ankle; no TTP; 2+ DP pulse; ROM intact throughout. Strength and Sensation intact.   Skin:    General: Skin is warm and dry.   Neurological:     Mental Status: He is alert.      ED Treatments / Results  Labs (all labs ordered are listed, but only abnormal results are displayed) Labs Reviewed  BASIC METABOLIC PANEL - Abnormal; Notable for the following components:      Result Value   Glucose, Bld 127 (*)    Calcium 8.8 (*)    All other components within normal limits  CBC WITH DIFFERENTIAL/PLATELET - Abnormal; Notable for the following components:   Hemoglobin 11.1 (*)    HCT 37.5 (*)    MCH 25.4 (*)    MCHC 29.6 (*)    All other components within normal limits  URINALYSIS, ROUTINE W REFLEX MICROSCOPIC - Abnormal; Notable for the following components:   Leukocytes,Ua TRACE (*)    All other components within normal limits  URINALYSIS, MICROSCOPIC (REFLEX) - Abnormal; Notable for the following components:   Bacteria, UA RARE (*)    All other components within normal limits  BRAIN NATRIURETIC PEPTIDE  TROPONIN I    EKG EKG Interpretation  Date/Time:  Wednesday Nov 17 2018 13:33:51 EDT Ventricular Rate:  69 PR Interval:    QRS Duration: 111 QT Interval:  394 QTC Calculation: 423 R Axis:   59 Text Interpretation:  Sinus rhythm RSR' in V1 or V2, right VCD or RVH No STEMI.  Confirmed by Nanda Quinton [phone]) on 11/17/2018 1:47:16 PM Also confirmed by Nanda Quinton [phone]), editor Philomena Doheny [phone])  on 11/17/2018 3:07:22 PM   Radiology Ct Angio Chest Pe W/cm &/or Wo Cm  Result Date: 11/17/2018 CLINICAL DATA:  Three 4 days of hypertension and shortness of breath. EXAM: CT ANGIOGRAPHY CHEST WITH CONTRAST TECHNIQUE: Multidetector CT imaging of the chest was performed using the standard protocol during bolus administration of intravenous contrast. Multiplanar CT image reconstructions and MIPs were obtained to evaluate the vascular anatomy. CONTRAST:  164mL OMNIPAQUE IOHEXOL 350 MG/ML SOLN COMPARISON:  Chest radiography same day.  Chest CT 01/13/2013 FINDINGS: Cardiovascular: Pulmonary arterial  opacification is good. No pulmonary emboli. No aortic atherosclerosis. Heart  size is normal. No pericardial fluid. Mediastinum/Nodes: No mass or lymphadenopathy. Lungs/Pleura: Lungs are clear. No infiltrate, mass, effusion or collapse. Upper Abdomen: Right renal cyst.  Otherwise normal. Musculoskeletal: Ordinary mild thoracic degenerative changes. Review of the MIP images confirms the above findings. IMPRESSION: No pulmonary emboli.  No acute chest pathology of any sort. Electronically Signed   By: Nelson Chimes M.D.   On: 11/17/2018 16:23   US Venous Img Lower  Left (dvt Study)  Result Date: 11/17/2018 CLINICAL DATA:  Left lower extremity pain and edema. History of lower extremity injury in March of this year. Evaluate for DVT. EXAM: LEFT LOWER EXTREMITY VENOUS DOPPLER ULTRASOUND TECHNIQUE: Gray-scale sonography with graded compression, as well as color Doppler and duplex ultrasound were performed to evaluate the lower extremity deep venous systems from the level of the common femoral vein and including the common femoral, femoral, profunda femoral, popliteal and calf veins including the posterior tibial, peroneal and gastrocnemius veins when visible. The superficial great saphenous vein was also interrogated. Spectral Doppler was utilized to evaluate flow at rest and with distal augmentation maneuvers in the common femoral, femoral and popliteal veins. COMPARISON:  None. FINDINGS: Contralateral Common Femoral Vein: Respiratory phasicity is normal and symmetric with the symptomatic side. No evidence of thrombus. Normal compressibility. Common Femoral Vein: No evidence of thrombus. Normal compressibility, respiratory phasicity and response to augmentation. Saphenofemoral Junction: No evidence of thrombus. Normal compressibility and flow on color Doppler imaging. Profunda Femoral Vein: No evidence of thrombus. Normal compressibility and flow on color Doppler imaging. Femoral Vein: No evidence of thrombus. Normal  compressibility, respiratory phasicity and response to augmentation. Popliteal Vein: No evidence of thrombus. Normal compressibility, respiratory phasicity and response to augmentation. Calf Veins: No evidence of thrombus. Normal compressibility and flow on color Doppler imaging. Superficial Great Saphenous Vein: No evidence of thrombus. Normal compressibility. Venous Reflux:  None. Other Findings: There is a minimal amount of subcutaneous edema at the level of the left lower leg and ankle. IMPRESSION: No evidence of DVT within the left lower extremity. Electronically Signed   By: Sandi Mariscal M.D.   On: 11/17/2018 14:39   Dg Chest Port 1 View  Result Date: 11/17/2018 CLINICAL DATA:  Shortness of breath. Anxiety. Left leg swelling status post injury EXAM: PORTABLE CHEST 1 VIEW COMPARISON:  01/13/2013 FINDINGS: The heart size appears normal. There is no pleural effusion or edema. No airspace opacities. Scar noted in the left base. The visualized osseous structures are unremarkable. IMPRESSION: 1. No acute cardiopulmonary abnormalities. Electronically Signed   By: Kerby Moors M.D.   On: 11/17/2018 13:12      Procedures Procedures (including critical care time)  Medications Ordered in ED Medications  iohexol (OMNIPAQUE) 350 MG/ML injection 100 mL (100 mLs Intravenous Contrast Given 11/17/18 1602)     Initial Impression / Assessment and Plan / ED Course  I have reviewed the triage vital signs and the nursing notes.  Pertinent labs & imaging results that were available during my care of the patient were reviewed by me and considered in my medical decision making (see chart for details).   Pt is a 63 year old male who presents with SOB x 2 days and left leg swelling x 2 months. He had telemedicine visit with PCP this AM and given symptoms was advised to come to the ED for further evaluation. Pt is currently on Xarelto for cardiac ablation he had done 5-6 years ago; denies any missed doses. Also on  Lasix with  full compliance. Satting 100% on RA. No chest pain with shortness of breath; will obtain troponin regardless. D-dimer not obtained given pt on anticoagulant; will obtain DVT study as well as CXR prior to ordered CTA to rule out other causes of SOB including PTX vs PNA. Baseline labs ordered including CBC, BMP, and BNP as well as EKG.   EKG without ischemic changes. CXR negative for any acute process; still awaiting results of DVT study; would ideally like CTA to rule out PE even with patient on xarelto; will have nursing staff attempt ultrasound guided IV as they were having issues getting proper IV placement for CTA.   DVT study negative for clot; Ultrasound guided IV obtained; pt transported to CT scan.   CT negative for PE or any acute chest pathology; all other workup negative as well; no elevation in BNP to suggest volume overload; negative troponin; do not feel patient needs delta trop as he is not having any chest pain. U/A with leuks and small amount of bacteria; pt asymptomatic at this time; do not feel patient needs treatment; will send for culture. Will have patient follow up with PCP regarding his shortness of breath. After a lengthy discussion patient feels like he got himself worked up due to his blood pressure monitoring having very high readings; pt has BP monitor with him in the ED and had several readings done with it and then with our equipment; do not believe pt's device is calibrated correctly; he will obtain another one and have PCP calibrate it. Pt also to follow up with cardiologist Dr. Stanford Breed who was aware he was in the ED today. Pt in agreement with plan at this time and stable for discharge home.          Final Clinical Impressions(s) / ED Diagnoses   Final diagnoses:  Shortness of breath  Left leg swelling  Essential hypertension    ED Discharge Orders    None       Eustaquio Maize, PA-C 11/17/18 1732    Margette Fast, MD 11/17/18 2025

## 2018-11-17 NOTE — Telephone Encounter (Signed)
  Patient is in the ER and his bp is 157/98 and and wife is calling for some clarification on what he should do about his BP medication

## 2018-11-17 NOTE — Telephone Encounter (Signed)
Sounds as though patient may need to be seen in the emergency room.  I cannot tell based on note what major issue is given swollen leg, dyspnea and vomiting. Kirk Ruths

## 2018-11-18 MED ORDER — HYDRALAZINE HCL 25 MG PO TABS: 25.0000 mg | ORAL_TABLET | Freq: Three times a day (TID) | ORAL | 3 refills | Status: DC

## 2018-11-18 NOTE — Telephone Encounter (Signed)
Add hydralazine 25 mg TID Kirk Ruths

## 2018-11-18 NOTE — Telephone Encounter (Signed)
Spoke with pt wife, Aware of dr Jacalyn Lefevre recommendations. New script sent to the pharmacy Follow up scheduled 5/15.

## 2018-11-19 ENCOUNTER — Telehealth (INDEPENDENT_AMBULATORY_CARE_PROVIDER_SITE_OTHER): Payer: 59 | Admitting: Cardiology

## 2018-11-19 ENCOUNTER — Encounter: Payer: Self-pay | Admitting: Cardiology

## 2018-11-19 VITALS — BP 122/80 | HR 73 | Ht 77.0 in | Wt 297.0 lb

## 2018-11-19 DIAGNOSIS — I480 Paroxysmal atrial fibrillation: Secondary | ICD-10-CM | POA: Diagnosis not present

## 2018-11-19 DIAGNOSIS — I420 Dilated cardiomyopathy: Secondary | ICD-10-CM

## 2018-11-19 DIAGNOSIS — I10 Essential (primary) hypertension: Secondary | ICD-10-CM

## 2018-11-19 LAB — URINE CULTURE: Culture: <10000 — AB

## 2018-11-19 NOTE — Patient Instructions (Signed)

## 2018-12-03 ENCOUNTER — Other Ambulatory Visit: Payer: Self-pay | Admitting: *Deleted

## 2018-12-03 ENCOUNTER — Encounter: Payer: Self-pay | Admitting: *Deleted

## 2018-12-03 MED ORDER — HYDRALAZINE HCL 50 MG PO TABS: 50.0000 mg | ORAL_TABLET | Freq: Three times a day (TID) | ORAL | 3 refills | Status: DC

## 2018-12-03 NOTE — Telephone Encounter (Signed)
This encounter was created in error - please disregard.

## 2018-12-09 ENCOUNTER — Other Ambulatory Visit: Payer: Self-pay | Admitting: Pharmacist

## 2018-12-09 MED ORDER — WARFARIN SODIUM 5 MG PO TABS: ORAL_TABLET | ORAL | 0 refills | Status: DC

## 2018-12-09 NOTE — Telephone Encounter (Signed)
Patient unable to afford Xarelto any longer. Transition back to warfarin.  Take [name] and warfarin daily today and tomorrow, then continue warfarin only. Repeat INR on 6/15 @ 3:45pm

## 2018-12-14 ENCOUNTER — Telehealth: Payer: Self-pay

## 2018-12-14 NOTE — Telephone Encounter (Signed)
lmom for prescreen  

## 2019-01-04 NOTE — Telephone Encounter (Signed)
Opened in error

## 2019-01-21 ENCOUNTER — Other Ambulatory Visit: Payer: Self-pay | Admitting: Cardiology

## 2019-01-26 ENCOUNTER — Telehealth: Payer: Self-pay

## 2019-01-26 NOTE — Telephone Encounter (Signed)
LMOM FOR PRESCREEN  

## 2019-02-08 ENCOUNTER — Telehealth: Payer: Self-pay | Admitting: Cardiology

## 2019-02-08 NOTE — Telephone Encounter (Signed)
New Message    Pt is calling to speak with Hilda Blades  He said he is out of rhythm and will need a cardio version   Pt also was caller 38 yesterday and says we need more assistance. He was upset because the last two weeks he has been on hold for almost an hour     Please call

## 2019-02-08 NOTE — Telephone Encounter (Signed)
Returned call to patient left message on personal voice mail Hilda Blades is out of office today.I will send message to her.

## 2019-02-09 ENCOUNTER — Ambulatory Visit (INDEPENDENT_AMBULATORY_CARE_PROVIDER_SITE_OTHER): Payer: 59 | Admitting: Pharmacist Clinician (PhC)/ Clinical Pharmacy Specialist

## 2019-02-09 ENCOUNTER — Other Ambulatory Visit: Payer: Self-pay

## 2019-02-09 DIAGNOSIS — I4891 Unspecified atrial fibrillation: Secondary | ICD-10-CM

## 2019-02-09 DIAGNOSIS — Z5181 Encounter for therapeutic drug level monitoring: Secondary | ICD-10-CM | POA: Diagnosis not present

## 2019-02-09 DIAGNOSIS — Z7901 Long term (current) use of anticoagulants: Secondary | ICD-10-CM | POA: Insufficient documentation

## 2019-02-09 LAB — POCT INR: INR: 1.4 — AB (ref 2.0–3.0)

## 2019-02-09 NOTE — Telephone Encounter (Signed)
Patient here for cvrr appointment, he reports he feels he is back in atrial fib. INR sub- theraputic, therefore; the patient scheduled to see the PA in 3 weeks same day as cvrr appointment to confirm a fib and set up for DCCV if needed.

## 2019-02-09 NOTE — Telephone Encounter (Signed)
Left message for pt to call.

## 2019-02-09 NOTE — Patient Instructions (Signed)
Take 10 mg today Wednesday Aug 5, then 7.5 mg Thursday Aug 6.  Then continue with 5 mg daily except 7.5 mg each Monday, Wednesday and Friday.  Repeat INR in 1 week

## 2019-02-10 ENCOUNTER — Telehealth: Payer: Self-pay | Admitting: Cardiology

## 2019-02-10 NOTE — Telephone Encounter (Signed)
Received message from pt through mychart; pt apparently called complaining of dyspnea and wanted O2 called in; nurse explained that we could not do that without documentation of need including O2 sat; pt told nurse he was significantly SOB and we therefore felt best option would be to be seen in ER. I attempted to call pt on his mobile number listed in chart and there was no answer. I left a message explaining above and asked that he be seen in the ER if he worsened. Clinton Ruths, MD

## 2019-02-10 NOTE — Telephone Encounter (Signed)
Spoke with pt who report he has been out of rhythm for about a week now and breathing has gotten worse. He state he can't breath while laying down and starting to freak out. He report it doesn't feel like his heart is beating rapidly but just can't breath. Pt requesting MD write a prescription for oxygen. Pt advised by nurse to report to ER for further evaluations due to symptoms. Pt state, "I will not go to the ER and would rather die before I go."  Dr. Stanford Breed made aware and wanted to inform pt that he can not write a prescription for oxygen as there are certain requirements that is required. Pt began to yell and screamed "that is BS and I would like you to tell the doctor I said so." Pt also informed that MD recommend that he report to ER if he feels like he can't breath. Pt screamed he will not go to the ER and then disconnected the line.

## 2019-02-10 NOTE — Telephone Encounter (Signed)
  Patient is calling stating that he is in AFIB and having trouble breathing. The afib started about a week ago but it has gotten worse. His breathing issue started about 3-4 days but its getting worse. He states he can't lay down flat because he cannot breathe.

## 2019-02-10 NOTE — Telephone Encounter (Signed)
  Patient is calling stating he is returning Debra's call

## 2019-02-11 ENCOUNTER — Other Ambulatory Visit: Payer: Self-pay

## 2019-02-11 ENCOUNTER — Encounter (HOSPITAL_COMMUNITY): Payer: Self-pay | Admitting: Physician Assistant

## 2019-02-11 ENCOUNTER — Ambulatory Visit (HOSPITAL_COMMUNITY)
Admission: RE | Admit: 2019-02-11 | Discharge: 2019-02-11 | Disposition: A | Discharge status: Home / Self Care | Payer: 59 | Source: Ambulatory Visit | Attending: Physician Assistant | Admitting: Physician Assistant

## 2019-02-11 ENCOUNTER — Telehealth: Payer: Self-pay | Admitting: Cardiology

## 2019-02-11 ENCOUNTER — Telehealth: Payer: Self-pay | Admitting: *Deleted

## 2019-02-11 VITALS — BP 144/82 | HR 100 | Ht 77.0 in | Wt 319.0 lb

## 2019-02-11 DIAGNOSIS — I4891 Unspecified atrial fibrillation: Secondary | ICD-10-CM | POA: Diagnosis not present

## 2019-02-11 DIAGNOSIS — Z79899 Other long term (current) drug therapy: Secondary | ICD-10-CM | POA: Insufficient documentation

## 2019-02-11 DIAGNOSIS — I4819 Other persistent atrial fibrillation: Secondary | ICD-10-CM | POA: Insufficient documentation

## 2019-02-11 DIAGNOSIS — Z7901 Long term (current) use of anticoagulants: Secondary | ICD-10-CM | POA: Insufficient documentation

## 2019-02-11 DIAGNOSIS — E669 Obesity, unspecified: Secondary | ICD-10-CM | POA: Diagnosis not present

## 2019-02-11 DIAGNOSIS — I428 Other cardiomyopathies: Secondary | ICD-10-CM | POA: Diagnosis not present

## 2019-02-11 DIAGNOSIS — G4730 Sleep apnea, unspecified: Secondary | ICD-10-CM | POA: Insufficient documentation

## 2019-02-11 DIAGNOSIS — R0681 Apnea, not elsewhere classified: Secondary | ICD-10-CM | POA: Diagnosis not present

## 2019-02-11 DIAGNOSIS — R0683 Snoring: Secondary | ICD-10-CM | POA: Diagnosis not present

## 2019-02-11 DIAGNOSIS — Z6837 Body mass index (BMI) 37.0-37.9, adult: Secondary | ICD-10-CM | POA: Insufficient documentation

## 2019-02-11 DIAGNOSIS — F329 Major depressive disorder, single episode, unspecified: Secondary | ICD-10-CM | POA: Diagnosis not present

## 2019-02-11 MED ORDER — METOPROLOL SUCCINATE ER 25 MG PO TB24: 25.0000 mg | ORAL_TABLET | Freq: Every day | ORAL | 1 refills | Status: DC

## 2019-02-11 NOTE — Patient Instructions (Signed)
Your physician has recommended you make the following change in your medication: Toprolol XL 25; take one tablet daily  We have referred you for a sleep study.  They will call you from another office to scheduled this.     Please call us Monday or Tuesday of next week and let us know how everything is going.

## 2019-02-11 NOTE — Addendum Note (Signed)
Encounter addended by: Iona Hansen, CMA on: 02/11/2019 3:45 PM  Actions taken: Visit diagnoses modified, Order list changed, Diagnosis association updated

## 2019-02-11 NOTE — Telephone Encounter (Signed)
-----   Message from Iona Hansen, [place] sent at 02/11/2019  3:41 PM EDT ----- This patient has an order in for a sleep study per Malka So, PA for witnessed apnea and snoring.  Can you please call him to schedule?  Thank you  [name]

## 2019-02-11 NOTE — Progress Notes (Signed)
Primary Care Physician: [name], MD Primary Cardiologist: Dr Stanford Breed Primary Electrophysiologist: Dr Rayann Heman Referring Physician: Dr Lucilla Edin [name] is a 63 y.o. male with a history of persistent atrial fibrillation (s/p ablation 2007 at [facility]), HTN, HLD, tachycardia mediated CM,  who presents for consultation in the [facility].  The patient was initially diagnosed with atrial fibrillation several years ago and is s/p afib ablation at [facility] 2007. He has done well since then requiring two cardioversions in the interim. He was in his usual state of health until about one week ago when he noted increased fatigue and dyspnea with exertion. This is typical of his afib symptoms in the past. ECG today confirms he is in afib. There were no triggers that the patient could identify. He denies any alcohol or caffeine use. He does admit to snoring and witnessed apnea episodes by his sleep partner. He denies any edema or weight gain.  Today, he denies symptoms of palpitations, chest pain, PND, lower extremity edema, dizziness, presyncope, syncope, snoring, daytime somnolence, bleeding, or neurologic sequela. The patient is tolerating medications without difficulties and is otherwise without complaint today.    Atrial Fibrillation Risk Factors:  he does have symptoms of sleep apnea. he does not have a history of rheumatic fever. he does have a history of alcohol use. The patient does not have a history of early familial atrial fibrillation or other arrhythmias.  he has a BMI of Body mass index is 37.83 kg/m.Marland Kitchen Filed Weights   02/11/19 1421  Weight: (!) 144.7 kg    Family History  Problem Relation Age of Onset   Asthma Mother    Breast cancer Mother    COPD Father    Lymphoma Sister      Atrial Fibrillation Management history:  Previous antiarrhythmic drugs: flecainide, sotalol, Tikosyn Previous cardioversions: 2013, 2018 Previous  ablations: 2007 [facility] CHADS2VASC score: 2 Anticoagulation history: Warfarin    Past Medical History:  Diagnosis Date   Allergic rhinitis    Asthma    as a child   Complication of anesthesia    "hard to put asleep, hard to wake up, nausea and vomiting"   Depression    History of echocardiogram    Echocardiogram 9/18: EF 60-65, normal wall motion   HLD (hyperlipidemia)    HTN (hypertension)    sees Dr. [name], [facility]   Hypertrophy of prostate with urinary obstruction and other lower urinary tract symptoms (LUTS)    Inguinal hernia without mention of obstruction or gangrene, unilateral or unspecified, (not specified as recurrent)    Lumbar herniated disc    L7   Narcotic addiction ([place])    NICM (nonischemic cardiomyopathy) ([place])    PAF (paroxysmal atrial fibrillation) ([place])    ablation done 03/2006, sees Dr. Ginger Organ,    Pneumonia    hx of 2004   PONV (postoperative nausea and vomiting)    Past Surgical History:  Procedure Laterality Date   ATRIAL ABLATION SURGERY  2007   [facility]   CARDIOVERSION  08/11/2011   Procedure: CARDIOVERSION;  Surgeon: Lelon Perla, MD;  Location: [place];  Service: Cardiovascular;  Laterality: N/A;   CARDIOVERSION N/A 04/16/2017   Procedure: CARDIOVERSION;  Surgeon: Sanda Klein, MD;  Location: [place];  Service: Cardiovascular;  Laterality: N/A;   EYE SURGERY     lasik, [place] Right 08/26/2012  Procedure: LAPAROSCOPIC INGUINAL HERNIA;  Surgeon: Madilyn Hook, DO;  Location: [place];  Service: General;  Laterality: Right;  laparoscopic right inguinal hernia repair with mesh, umbilical hernia repair   INSERTION OF MESH Right 08/26/2012   Procedure: INSERTION OF MESH;  Surgeon: Madilyn Hook, DO;  Location: [place];  Service: General;  Laterality: Right;  right inguinal hernia   UMBILICAL HERNIA REPAIR N/A 08/26/2012   Procedure: HERNIA REPAIR UMBILICAL  ADULT;  Surgeon: Madilyn Hook, DO;  Location: [place];  Service: General;  Laterality: N/A;    Current Outpatient Medications  Medication Sig Dispense Refill   acetaminophen (TYLENOL) 325 MG tablet Take 650 mg by mouth every 6 (six) hours as needed. For pain     diltiazem (TAZTIA XT) 360 MG 24 hr capsule Take 360 mg by mouth every morning.     DULoxetine (CYMBALTA) 30 MG capsule Take 30 mg by mouth daily.       hydrochlorothiazide (HYDRODIURIL) 25 MG tablet 1 tablet daily.     lisinopril (PRINIVIL,ZESTRIL) 40 MG tablet Take 1 tablet (40 mg total) by mouth daily. 30 tablet 12   montelukast (SINGULAIR) 10 MG tablet Take 10 mg by mouth at bedtime as needed. Allergies.     sodium chloride (OCEAN) 0.65 % nasal spray Place 1 spray into the nose as needed for congestion.     traMADol (ULTRAM) 50 MG tablet Take 50 mg by mouth every 6 (six) hours as needed for severe pain.      warfarin (COUMADIN) 5 MG tablet TAKE 1 TO 1 AND 1/2 TABLETS BY MOUTH DAILY AS DIRECTED BY COUMADIN CLINIC 45 tablet 0   hydrALAZINE (APRESOLINE) 50 MG tablet Take 1 tablet (50 mg total) by mouth 3 (three) times daily. (Patient not taking: Reported on 02/11/2019) 270 tablet 3   metoprolol succinate (TOPROL XL) 25 MG 24 hr tablet Take 1 tablet (25 mg total) by mouth daily. 30 tablet 1   No current facility-administered medications for this encounter.     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: Not on file   Food insecurity    Worry: Not on file    Inability: Not on file   Transportation needs    Medical: Not on file    Non-medical: Not on file  Tobacco Use   Smoking status: Never Smoker   Smokeless tobacco: Never  Used  Substance and Sexual Activity   Alcohol use: No    Alcohol/week: 0.0 standard drinks   Drug use: No   Sexual activity: Yes  Lifestyle   Physical activity    Days per week: Not on file    Minutes per session: Not on file   Stress: Not on file  Relationships   Social connections    Talks on phone: Not on file    Gets together: Not on file    Attends religious service: Not on file    Active member of club or organization: Not on file    Attends meetings of clubs or organizations: Not on file    Relationship status: Not on file   Intimate partner violence    Fear of current or ex partner: Not  on file    Emotionally abused: Not on file    Physically abused: Not on file    Forced sexual activity: Not on file  Other Topics Concern   Not on file  Social History Narrative   Not on file     ROS- All systems are reviewed and negative except as per the HPI above.  Physical Exam: Vitals:   02/11/19 1421  BP: (!) 144/82  Pulse: 100  SpO2: 98%  Weight: (!) 144.7 kg  Height: 6\' 5"  (1.956 m)    GEN- The patient is well appearing obese male, alert and oriented x 3 today.   Head- normocephalic, atraumatic Eyes-  Sclera clear, conjunctiva pink Ears- hearing intact Oropharynx- clear Neck- supple  Lungs- Clear to ausculation bilaterally, normal work of breathing Heart- irregular rate and rhythm, no murmurs, rubs or gallops  GI- soft, NT, ND, + BS Extremities- no clubbing, cyanosis, or edema MS- no significant deformity or atrophy Skin- no rash or lesion Psych- euthymic mood, full affect Neuro- strength and sensation are intact  Wt Readings from Last 3 Encounters:  02/11/19 (!) 144.7 kg  11/19/18 134.7 kg  11/17/18 (!) 137.9 kg    EKG today demonstrates afib HR 100, incRBBB, QRS 104, QTc 479  Echo demonstrated  - Left ventricle: The cavity size was normal. Systolic function was   normal. The estimated ejection fraction was in the range of 60%   to 65%.  Wall motion was normal; there were no regional wall   motion abnormalities. The study is not technically sufficient to   allow evaluation of LV diastolic function. - Left atrium: Volume/bsa, ES, (1-plane Simpson&'s, A2C): 30.6   ml/m^2. LA 54 mm  Epic records are reviewed at length today  Assessment and Plan:  1. Persistent atrial fibrillation Overall patient has done suprisingly well given severe LAE. Patient appears to be in symptomatic persistent atrial fibrillation for about one week.  General education about atrial fibrillation discussed and questions answered. Recall he has failed flecainide, sotalol, and Tikosyn in the past. Not a likely candidate for repeat ablation given LA size.  Will start Toprol 25 mg daily. Encouraged him to take this at night to mitigate fatigue side effect. He already has weekly INRs scheduled. If he remains in afib after four INRs, would recommend DCCV. Lifestyle changes as below.  This patients CHA2DS2-VASc Score and unadjusted Ischemic Stroke Rate (% per year) is equal to 2.2 % stroke rate/year from a score of 2  Above score calculated as 1 point each if present [CHF, HTN, DM, Vascular=MI/PAD/Aortic Plaque, Age if 65-74, or Male] Above score calculated as 2 points each if present [Age > 75, or Stroke/TIA/TE]   2. Obesity Body mass index is 37.83 kg/m. Lifestyle modification was discussed at length including regular exercise and weight reduction.  3. Snoring/witnessed apnea The importance of adequate treatment of sleep apnea was discussed today in order to improve our ability to maintain sinus rhythm long term. Will refer for sleep study.  4. HTN Stable, med changes as above.   Follow up for INRs and office visit with Kerin Ransom PA-C as scheduled. Patient to call clinic next week with update.   [facility] [address] [place], [place] [address] [phone] 02/11/2019 3:35 PM

## 2019-02-11 NOTE — Telephone Encounter (Signed)
Patient would like to speak with you. Thanks.

## 2019-02-11 NOTE — Telephone Encounter (Signed)
  Mr Quevedo would like to speak with Hilda Blades in regards to previous ongoing conversation

## 2019-02-11 NOTE — Telephone Encounter (Signed)
Spoke with pt, he was calling to thank me for the appointment today and he apologized for his actions. He is going to send an email to dr Stanford Breed.

## 2019-02-16 ENCOUNTER — Other Ambulatory Visit: Payer: Self-pay

## 2019-02-16 ENCOUNTER — Ambulatory Visit (INDEPENDENT_AMBULATORY_CARE_PROVIDER_SITE_OTHER): Payer: 59 | Admitting: Pharmacist Clinician (PhC)/ Clinical Pharmacy Specialist

## 2019-02-16 ENCOUNTER — Other Ambulatory Visit: Payer: Self-pay | Admitting: Physician Assistant

## 2019-02-16 DIAGNOSIS — Z7901 Long term (current) use of anticoagulants: Secondary | ICD-10-CM

## 2019-02-16 DIAGNOSIS — I4891 Unspecified atrial fibrillation: Secondary | ICD-10-CM | POA: Diagnosis not present

## 2019-02-16 DIAGNOSIS — R0683 Snoring: Secondary | ICD-10-CM

## 2019-02-16 DIAGNOSIS — R0681 Apnea, not elsewhere classified: Secondary | ICD-10-CM

## 2019-02-16 LAB — POCT INR: INR: 5.6 — AB (ref 2.0–3.0)

## 2019-02-19 ENCOUNTER — Other Ambulatory Visit: Payer: Self-pay | Admitting: Cardiology

## 2019-02-21 NOTE — Telephone Encounter (Signed)
Please review for refill. Thank you! 

## 2019-02-24 ENCOUNTER — Ambulatory Visit (INDEPENDENT_AMBULATORY_CARE_PROVIDER_SITE_OTHER): Payer: 59 | Admitting: Pharmacist

## 2019-02-24 ENCOUNTER — Other Ambulatory Visit: Payer: Self-pay

## 2019-02-24 DIAGNOSIS — Z7901 Long term (current) use of anticoagulants: Secondary | ICD-10-CM

## 2019-02-24 DIAGNOSIS — I4891 Unspecified atrial fibrillation: Secondary | ICD-10-CM | POA: Diagnosis not present

## 2019-02-24 LAB — POCT INR: INR: 2.1 (ref 2.0–3.0)

## 2019-02-25 ENCOUNTER — Other Ambulatory Visit: Payer: Self-pay | Admitting: *Deleted

## 2019-02-25 ENCOUNTER — Encounter: Payer: Self-pay | Admitting: *Deleted

## 2019-02-25 DIAGNOSIS — I4819 Other persistent atrial fibrillation: Secondary | ICD-10-CM

## 2019-02-25 DIAGNOSIS — I4891 Unspecified atrial fibrillation: Secondary | ICD-10-CM

## 2019-02-25 MED ORDER — METOPROLOL SUCCINATE ER 25 MG PO TB24: 25.0000 mg | ORAL_TABLET | Freq: Every day | ORAL | 1 refills | Status: DC

## 2019-02-25 MED ORDER — METOPROLOL SUCCINATE ER 25 MG PO TB24: 25.0000 mg | ORAL_TABLET | Freq: Every day | ORAL | 3 refills | Status: DC

## 2019-02-25 NOTE — Telephone Encounter (Signed)
Spoke with pt, he is scheduled for a TEE DCCV 03/04/2019 @ 8 am.    You are scheduled for a TEE Cardioversion on Friday 03/04/2019 with Dr. Stanford Breed.  Please arrive at the [facility] (Main Entrance A) at [facility]: [address] [place], [place] [address] at 7 am. (1 hour prior to procedure unless lab work is needed; if lab work is needed arrive 1.5 hours ahead)  DIET: Nothing to eat or drink after midnight except a sip of water with medications (see medication instructions below)  Medication Instructions: DO NOT TAKE METOPROLOL THE MORNING OF THE PROCEDURE  Continue your anticoagulant: WARFARIN  GO TO [facility] Tuesday 03/01/2019 @  8:10 AM FOR COVID TESTING  You must have a responsible person to drive you home and stay in the waiting area during your procedure. Failure to do so could result in cancellation.  Bring your insurance cards.  *Special Note: Every effort is made to have your procedure done on time. Occasionally there are emergencies that occur at the hospital that may cause delays. Please be patient if a delay does occur.    Instructions discussed and sent to patient vis my chart.

## 2019-02-25 NOTE — Telephone Encounter (Signed)
INR was 2.1 yesterday - he was given a dose increase to be sure it did not drop below 2 again.

## 2019-03-01 ENCOUNTER — Other Ambulatory Visit (HOSPITAL_COMMUNITY)
Admission: RE | Admit: 2019-03-01 | Discharge: 2019-03-01 | Disposition: A | Discharge status: Home / Self Care | Payer: 59 | Source: Ambulatory Visit | Attending: Cardiology | Admitting: Cardiology

## 2019-03-01 DIAGNOSIS — Z01812 Encounter for preprocedural laboratory examination: Secondary | ICD-10-CM | POA: Insufficient documentation

## 2019-03-01 DIAGNOSIS — Z20828 Contact with and (suspected) exposure to other viral communicable diseases: Secondary | ICD-10-CM | POA: Insufficient documentation

## 2019-03-01 LAB — SARS CORONAVIRUS 2 (TAT 6-24 HRS): SARS Coronavirus 2: NEGATIVE

## 2019-03-03 ENCOUNTER — Other Ambulatory Visit: Payer: Self-pay

## 2019-03-03 ENCOUNTER — Ambulatory Visit (INDEPENDENT_AMBULATORY_CARE_PROVIDER_SITE_OTHER): Payer: 59 | Admitting: Cardiology

## 2019-03-03 ENCOUNTER — Encounter: Payer: Self-pay | Admitting: Cardiology

## 2019-03-03 ENCOUNTER — Ambulatory Visit (INDEPENDENT_AMBULATORY_CARE_PROVIDER_SITE_OTHER): Payer: 59 | Admitting: Pharmacist

## 2019-03-03 VITALS — BP 143/92 | HR 93 | Temp 97.0°F | Ht 77.0 in | Wt 329.0 lb

## 2019-03-03 DIAGNOSIS — I10 Essential (primary) hypertension: Secondary | ICD-10-CM | POA: Diagnosis not present

## 2019-03-03 DIAGNOSIS — I4891 Unspecified atrial fibrillation: Secondary | ICD-10-CM | POA: Diagnosis not present

## 2019-03-03 DIAGNOSIS — I480 Paroxysmal atrial fibrillation: Secondary | ICD-10-CM

## 2019-03-03 DIAGNOSIS — R0683 Snoring: Secondary | ICD-10-CM

## 2019-03-03 DIAGNOSIS — Z7901 Long term (current) use of anticoagulants: Secondary | ICD-10-CM

## 2019-03-03 DIAGNOSIS — I509 Heart failure, unspecified: Secondary | ICD-10-CM | POA: Insufficient documentation

## 2019-03-03 DIAGNOSIS — G4719 Other hypersomnia: Secondary | ICD-10-CM

## 2019-03-03 LAB — BASIC METABOLIC PANEL
BUN/Creatinine Ratio: 21 (ref 10–24)
BUN: 25 mg/dL (ref 8–27)
CO2: 21 mmol/L (ref 20–29)
Calcium: 8.9 mg/dL (ref 8.6–10.2)
Chloride: 105 mmol/L (ref 96–106)
Creatinine, Ser: 1.17 mg/dL (ref 0.76–1.27)
GFR calc Af Amer: 77 mL/min/{1.73_m2} (ref >59)
GFR calc non Af Amer: 67 mL/min/{1.73_m2} (ref >59)
Glucose: 100 mg/dL — ABNORMAL HIGH (ref 65–99)
Potassium: 4.7 mmol/L (ref 3.5–5.2)
Sodium: 141 mmol/L (ref 134–144)

## 2019-03-03 LAB — CBC WITH DIFFERENTIAL/PLATELET
Basophils Absolute: 0.1 10*3/uL (ref 0.0–0.2)
Basos: 1 %
EOS (ABSOLUTE): 0.4 10*3/uL (ref 0.0–0.4)
Eos: 4 %
Hematocrit: 35.6 % — ABNORMAL LOW (ref 37.5–51.0)
Hemoglobin: 10.9 g/dL — ABNORMAL LOW (ref 13.0–17.7)
Immature Grans (Abs): 0 10*3/uL (ref 0.0–0.1)
Immature Granulocytes: 0 %
Lymphocytes Absolute: 2.2 10*3/uL (ref 0.7–3.1)
Lymphs: 22 %
MCH: 23.6 pg — ABNORMAL LOW (ref 26.6–33.0)
MCHC: 30.6 g/dL — ABNORMAL LOW (ref 31.5–35.7)
MCV: 77 fL — ABNORMAL LOW (ref 79–97)
Monocytes Absolute: 1 10*3/uL — ABNORMAL HIGH (ref 0.1–0.9)
Monocytes: 10 %
Neutrophils Absolute: 6.3 10*3/uL (ref 1.4–7.0)
Neutrophils: 63 %
Platelets: 320 10*3/uL (ref 150–450)
RBC: 4.61 x10E6/uL (ref 4.14–5.80)
RDW: 16.7 % — ABNORMAL HIGH (ref 11.6–15.4)
WBC: 9.9 10*3/uL (ref 3.4–10.8)

## 2019-03-03 LAB — MAGNESIUM: Magnesium: 1.9 mg/dL (ref 1.6–2.3)

## 2019-03-03 LAB — POCT INR: INR: 3.1 — AB (ref 2.0–3.0)

## 2019-03-03 MED ORDER — POTASSIUM CHLORIDE CRYS ER 20 MEQ PO TBCR: 20.0000 meq | EXTENDED_RELEASE_TABLET | Freq: Every day | ORAL | 0 refills | Status: DC

## 2019-03-03 MED ORDER — FUROSEMIDE 40 MG PO TABS: 40.0000 mg | ORAL_TABLET | Freq: Every day | ORAL | 0 refills | Status: DC

## 2019-03-03 NOTE — Assessment & Plan Note (Addendum)
Recurrent symptomatic PAF- scheduled for TEE DCCV 8/28

## 2019-03-03 NOTE — Assessment & Plan Note (Signed)
BMI 37- suspected sleep apnea- sleep study ordered.

## 2019-03-03 NOTE — Assessment & Plan Note (Signed)
Echo 2018- normal LVF, normal LA

## 2019-03-03 NOTE — Progress Notes (Signed)
Cardiology Office Note:    Date:  03/03/2019   ID:  [name], DOB 10/24/1955, MRN [id number]  PCP:  [name], MD  Cardiologist:  Kirk Ruths, MD  Electrophysiologist:  None   Referring MD: [name], MD   Chief Complaint  Patient presents with  . Follow-up    History of Present Illness:    [name] is a 63 y.o. male with a hx of PAF, HTN, obesity, and suspected sleep apnea.  He had an ablation at [facility] in 2007.  He subsequently had cardioversions in the interim.  Records indicate he is failed flecainide, sotalol, and Tikosyn.  He has been on anticoagulation, he could not afford Xarelto and was recently started on Coumadin for recurrent PAF.  Echocardiogram in September 2018 showed normal LV function with normal LA size.  Around 1 August he began to have symptoms consistent with his atrial fibrillation, he had dyspnea on exertion and weight gain.  He was seen in the atrial fibrillation clinic.  Plan was for 4 weeks of therapeutic Coumadin and then outpatient DC cardioversion.  In the interim the patient has developed increasing shortness of breath and orthopnea.  Dr. Stanford Breed had him set up for TEE cardioversion tomorrow and he is in the office today for evaluation prior to that.  Patient admits to increasing lower extremity edema and increasing dyspnea.  His EKG shows atrial fibrillation with a ventricular response in the 90s.  Past Medical History:  Diagnosis Date  . Allergic rhinitis   . Asthma    as a child  . Complication of anesthesia    "hard to put asleep, hard to wake up, nausea and vomiting"  . Depression   . History of echocardiogram    Echocardiogram 9/18: EF 60-65, normal wall motion  . HLD (hyperlipidemia)   . HTN (hypertension)    sees Dr. [name], [facility]  . Hypertrophy of prostate with urinary obstruction and other lower urinary tract symptoms (LUTS)   . Inguinal hernia without mention of obstruction or gangrene,  unilateral or unspecified, (not specified as recurrent)   . Lumbar herniated disc    L7  . Narcotic addiction ([place])   . NICM (nonischemic cardiomyopathy) ([place])   . PAF (paroxysmal atrial fibrillation) ([place])    ablation done 03/2006, sees Dr. Ginger Organ, [facility]  . Pneumonia    hx of 2004  . PONV (postoperative nausea and vomiting)     Past Surgical History:  Procedure Laterality Date  . ATRIAL ABLATION SURGERY  2007   [facility]  . CARDIOVERSION  08/11/2011   Procedure: CARDIOVERSION;  Surgeon: Lelon Perla, MD;  Location: [place];  Service: Cardiovascular;  Laterality: N/A;  . CARDIOVERSION N/A 04/16/2017   Procedure: CARDIOVERSION;  Surgeon: Sanda Klein, MD;  Location: [place] [facility];  Service: Cardiovascular;  Laterality: N/A;  . EYE SURGERY     lasik, 1998  . HERNIA REPAIR     1977  . INGUINAL HERNIA REPAIR Right 08/26/2012   Procedure: LAPAROSCOPIC INGUINAL HERNIA;  Surgeon: Madilyn Hook, DO;  Location: [place];  Service: General;  Laterality: Right;  laparoscopic right inguinal hernia repair with mesh, umbilical hernia repair  . INSERTION OF MESH Right 08/26/2012   Procedure: INSERTION OF MESH;  Surgeon: Madilyn Hook, DO;  Location: [place];  Service: General;  Laterality: Right;  right inguinal hernia  . UMBILICAL HERNIA REPAIR N/A 08/26/2012   Procedure: HERNIA REPAIR UMBILICAL ADULT;  Surgeon: Madilyn Hook, DO;  Location: [facility];  Service: General;  Laterality: N/A;    Current Medications: Current Meds  Medication Sig  . acetaminophen (TYLENOL) 325 MG tablet Take 650 mg by mouth every 6 (six) hours as needed. For pain  . diltiazem (CARDIZEM CD) 360 MG 24 hr capsule Take 360 mg by mouth daily.  . DULoxetine (CYMBALTA) 60 MG capsule Take 60 mg by mouth 2 (two) times daily.  Marland Kitchen gabapentin (NEURONTIN) 300 MG capsule Take 1,200 mg by mouth at bedtime as needed (pain.).   Marland Kitchen lisinopril (PRINIVIL,ZESTRIL) 40 MG tablet Take 1 tablet (40 mg total) by mouth daily.  . metoprolol succinate  (TOPROL XL) 25 MG 24 hr tablet Take 1 tablet (25 mg total) by mouth daily. Take an extra as needed for elevated heart rate (Patient taking differently: Take 25 mg by mouth every evening. )  . montelukast (SINGULAIR) 10 MG tablet Take 10 mg by mouth at bedtime as needed (allergies).   Marland Kitchen omeprazole (PRILOSEC) 20 MG capsule Take 20 mg by mouth daily as needed (acid reflux/indigestion.).   Marland Kitchen rOPINIRole (REQUIP) 1 MG tablet Take 1-2 mg by mouth at bedtime as needed (restless leg syndrome). 1-3 hours prior to bedtime   . sildenafil (REVATIO) 20 MG tablet Take 40-100 mg by mouth daily as needed.  . warfarin (COUMADIN) 5 MG tablet TAKE 1 TO 1 AND 1/2 TABLETS BY MOUTH DAILY AS DIRECTED BY COUMADIN CLINIC (Patient taking differently: Take 5-7.5 mg by mouth See admin instructions. Take 1.5 tablets (7.5 mg) by mouth on Mondays, Wednesdays, & Fridays. Take 1 tablet (5 mg) by mouth on Sundays, Tuesdays, Thursdays, & Saturdays.)     Allergies:   Patient has no known allergies.   Social History   Socioeconomic History  . Marital status: Married    Spouse name: Not on file  . Number of children: Not on file  . Years of education: Not on file  . Highest education level: Not on file  Occupational History  . 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 Financial resource strain: Not on file  . Food insecurity    Worry: Not on file    Inability: Not on file  . Transportation needs    Medical: Not on file    Non-medical: Not on file  Tobacco Use  . Smoking status: Never Smoker  . Smokeless tobacco: Never Used  Substance and Sexual Activity  . Alcohol use: No    Alcohol/week: 0.0 standard drinks  . Drug use: No  . Sexual activity: Yes  Lifestyle  . Physical activity    Days per week: Not on file    Minutes per session: Not on file  . Stress: Not on file  Relationships  . Social Herbalist on phone: Not on file    Gets together: Not on file    Attends religious service: Not  on file    Active member of club or organization: Not on file    Attends meetings of clubs or organizations: Not on file    Relationship status: Not on file  Other Topics Concern  . Not on file  Social History Narrative  . Not on file     Family History: The patient's family history includes Asthma in his mother; Breast cancer in his mother; COPD in his father; Lymphoma in his sister.  ROS:   Please see the history of present illness.    Pt snores, has HTN, obesity, arrhythmia, and daytime fatigue  All other  systems reviewed and are negative.  EKGs/Labs/Other Studies Reviewed:    The following studies were reviewed today: Echo sept 2018  EKG:  EKG is ordered today.  The ekg ordered today demonstrates AF with VR 93  Recent Labs: 11/17/2018: B Natriuretic Peptide 35.7; BUN 20; Creatinine, Ser 0.78; Hemoglobin 11.1; Platelets 393; Potassium 4.1; Sodium 137  Recent Lipid Panel    Component Value Date/Time   CHOL  02/14/2009 0630    126        ATP III CLASSIFICATION:  <200     mg/dL   Desirable  200-239  mg/dL   Borderline High  >=240    mg/dL   High          TRIG 95 02/14/2009 0630   HDL 48 02/14/2009 0630   CHOLHDL 2.6 02/14/2009 0630   VLDL 19 02/14/2009 0630   LDLCALC  02/14/2009 0630    59        Total Cholesterol/HDL:CHD Risk Coronary Heart Disease Risk Table                     Men   Women  1/2 Average Risk   3.4   3.3  Average Risk       5.0   4.4  2 X Average Risk   9.6   7.1  3 X Average Risk  23.4   11.0        Use the calculated Patient Ratio above and the CHD Risk Table to determine the patient's CHD Risk.        ATP III CLASSIFICATION (LDL):  <100     mg/dL   Optimal  100-129  mg/dL   Near or Above                    Optimal  130-159  mg/dL   Borderline  160-189  mg/dL   High  >190     mg/dL   Very High    Physical Exam:    VS:  BP (!) 143/92     Wt Readings from Last 3 Encounters:  02/11/19 (!) 319 lb (144.7 kg)  11/19/18 297 lb (134.7  kg)  11/17/18 (!) 304 lb (137.9 kg)     GEN: Obese Caucasian male, in no acute distress HEENT: Normal NECK: No JVD; No carotid bruits LYMPHATICS: No lymphadenopathy CARDIAC: irregularly irregular with 2/6 MR murmur RESPIRATORY:  Clear to auscultation without rales, wheezing or rhonchi  ABDOMEN: Soft, non-tender, non-distended MUSCULOSKELETAL:  1+ pre tibial pitting edema No deformity  SKIN: Warm and dry NEUROLOGIC:  Alert and oriented x 3 PSYCHIATRIC:  Normal affect   ASSESSMENT:    PAF (paroxysmal atrial fibrillation) (HCC) Recurrent symptomatic PAF- scheduled for TEE DCCV 8/28  Anticoagulated On Coumadin-CHADS VASC=2  Essential hypertension Echo 2018- normal LVF, normal LA  Morbid obesity (HCC) BMI 37- suspected sleep apnea- sleep study ordered.   Acute CHF ([place]) Secondary to AF  PLAN:    I will give him a dose of Lasix 40 mg tonight with KCL 20 MEq.  Labs ordered STAT for TEE CV tomorrow.  He may need a diuretic for a while post cardioversion but will defer to dr Stanford Breed.  I will order sleep study if that has not already been done.     Medication Adjustments/Labs and Tests Ordered: Current medicines are reviewed at length with the patient today.  Concerns regarding medicines are outlined above.  No orders of the defined types were placed in  this encounter.  No orders of the defined types were placed in this encounter.   Patient Instructions  Medication Instructions:  Take 1 dose of Lasix 40mg  today only Take 1 dose of Potassium 28meq today only with Lasix If you need a refill on your cardiac medications before your next appointment, please call your pharmacy.   Lab work: Your physician recommends that you return for lab work in: TODAY-BMET, MAG, CBC If you have labs (blood work) drawn today and your tests are completely normal, you will receive your results only by: Marland Kitchen MyChart Message (if you have MyChart) OR . A paper copy in the mail If you have any lab  test that is abnormal or we need to change your treatment, we will call you to review the results.  Testing/Procedures: Your physician has recommended that you have a sleep study. This test records several body functions during sleep, including: brain activity, eye movement, oxygen and carbon dioxide blood levels, heart rate and rhythm, breathing rate and rhythm, the flow of air through your mouth and nose, snoring, body muscle movements, and chest and belly movement. WANDA OUR SLEEP COORDINATOR WILL BE IN CONTACT WITH YOU ABOUT YOUR SLEEP STUDY.    Follow-Up: At [facility], you and your health needs are our priority.  As part of our continuing mission to provide you with exceptional heart care, we have created designated Provider Care Teams.  These Care Teams include your primary Cardiologist (physician) and Advanced Practice Providers (APPs -  Physician Assistants and Nurse Practitioners) who all work together to provide you with the care you need, when you need it. You will need a follow up appointment in 3-4 weeks.  Please call our office 2 months in advance to schedule this appointment.  You may see Kirk Ruths, MD ONLY or one of the following Advanced Practice Providers on your designated Care Team:   Kerin Ransom, PA-C Roby Lofts, [place] . Sande Rives, PA-C  Any Other Special Instructions Will Be Listed Below (If Applicable).      Signed, Kerin Ransom, PA-C  03/03/2019 3:36 PM    [facility]

## 2019-03-03 NOTE — Progress Notes (Signed)
Attempted to contact patient regarding 03/04/19 procedure, no answer.

## 2019-03-03 NOTE — Assessment & Plan Note (Signed)
Secondary to AF

## 2019-03-03 NOTE — Patient Instructions (Addendum)
Medication Instructions:  Take 1 dose of Lasix 40mg  today only Take 1 dose of Potassium 65meq today only with Lasix If you need a refill on your cardiac medications before your next appointment, please call your pharmacy.   Lab work: Your physician recommends that you return for lab work in: TODAY-BMET, MAG, CBC If you have labs (blood work) drawn today and your tests are completely normal, you will receive your results only by: Marland Kitchen MyChart Message (if you have MyChart) OR . A paper copy in the mail If you have any lab test that is abnormal or we need to change your treatment, we will call you to review the results.  Testing/Procedures: Your physician has recommended that you have a sleep study. This test records several body functions during sleep, including: brain activity, eye movement, oxygen and carbon dioxide blood levels, heart rate and rhythm, breathing rate and rhythm, the flow of air through your mouth and nose, snoring, body muscle movements, and chest and belly movement. WANDA OUR SLEEP COORDINATOR WILL BE IN CONTACT WITH YOU ABOUT YOUR SLEEP STUDY.    Follow-Up: At [facility], you and your health needs are our priority.  As part of our continuing mission to provide you with exceptional heart care, we have created designated Provider Care Teams.  These Care Teams include your primary Cardiologist (physician) and Advanced Practice Providers (APPs -  Physician Assistants and Nurse Practitioners) who all work together to provide you with the care you need, when you need it. You will need a follow up appointment in 3-4 weeks.  Please call our office 2 months in advance to schedule this appointment.  You may see Kirk Ruths, MD ONLY or one of the following Advanced Practice Providers on your designated Care Team:   Kerin Ransom, PA-C Roby Lofts, [place] . Sande Rives, PA-C  Any Other Special Instructions Will Be Listed Below (If Applicable).

## 2019-03-03 NOTE — H&P (View-Only) (Signed)
Cardiology Office Note:    Date:  03/03/2019   ID:  [name], DOB July 09, 1955, MRN [id number]  PCP:  [name], MD  Cardiologist:  Kirk Ruths, MD  Electrophysiologist:  None   Referring MD: [name], MD   Chief Complaint  Patient presents with  . Follow-up    History of Present Illness:    [name] is a 63 y.o. male with a hx of PAF, HTN, obesity, and suspected sleep apnea.  He had an ablation at [facility] in 2007.  He subsequently had cardioversions in the interim.  Records indicate he is failed flecainide, sotalol, and Tikosyn.  He has been on anticoagulation, he could not afford Xarelto and was recently started on Coumadin for recurrent PAF.  Echocardiogram in September 2018 showed normal LV function with normal LA size.  Around 1 August he began to have symptoms consistent with his atrial fibrillation, he had dyspnea on exertion and weight gain.  He was seen in the atrial fibrillation clinic.  Plan was for 4 weeks of therapeutic Coumadin and then outpatient DC cardioversion.  In the interim the patient has developed increasing shortness of breath and orthopnea.  Dr. Stanford Breed had him set up for TEE cardioversion tomorrow and he is in the office today for evaluation prior to that.  Patient admits to increasing lower extremity edema and increasing dyspnea.  His EKG shows atrial fibrillation with a ventricular response in the 90s.  Past Medical History:  Diagnosis Date  . Allergic rhinitis   . Asthma    as a child  . Complication of anesthesia    "hard to put asleep, hard to wake up, nausea and vomiting"  . Depression   . History of echocardiogram    Echocardiogram 9/18: EF 60-65, normal wall motion  . HLD (hyperlipidemia)   . HTN (hypertension)    sees Dr. [name], [facility]  . Hypertrophy of prostate with urinary obstruction and other lower urinary tract symptoms (LUTS)   . Inguinal hernia without mention of obstruction or gangrene,  unilateral or unspecified, (not specified as recurrent)   . Lumbar herniated disc    L7  . Narcotic addiction ([place])   . NICM (nonischemic cardiomyopathy) ([place])   . PAF (paroxysmal atrial fibrillation) ([place])    ablation done 03/2006, sees Dr. Ginger Organ, [facility]  . Pneumonia    hx of 2004  . PONV (postoperative nausea and vomiting)     Past Surgical History:  Procedure Laterality Date  . ATRIAL ABLATION SURGERY  2007   [place]  . CARDIOVERSION  08/11/2011   Procedure: CARDIOVERSION;  Surgeon: Lelon Perla, MD;  Location: [place];  Service: Cardiovascular;  Laterality: N/A;  . CARDIOVERSION N/A 04/16/2017   Procedure: CARDIOVERSION;  Surgeon: Sanda Klein, MD;  Location: [place] [facility];  Service: Cardiovascular;  Laterality: N/A;  . EYE SURGERY     lasik, 1998  . HERNIA REPAIR     1977  . INGUINAL HERNIA REPAIR Right 08/26/2012   Procedure: LAPAROSCOPIC INGUINAL HERNIA;  Surgeon: Madilyn Hook, DO;  Location: [place];  Service: General;  Laterality: Right;  laparoscopic right inguinal hernia repair with mesh, umbilical hernia repair  . INSERTION OF MESH Right 08/26/2012   Procedure: INSERTION OF MESH;  Surgeon: Madilyn Hook, DO;  Location: [place];  Service: General;  Laterality: Right;  right inguinal hernia  . UMBILICAL HERNIA REPAIR N/A 08/26/2012   Procedure: HERNIA REPAIR UMBILICAL ADULT;  Surgeon: Madilyn Hook, DO;  Location: [facility]  OR;  Service: General;  Laterality: N/A;    Current Medications: Current Meds  Medication Sig  . acetaminophen (TYLENOL) 325 MG tablet Take 650 mg by mouth every 6 (six) hours as needed. For pain  . diltiazem (CARDIZEM CD) 360 MG 24 hr capsule Take 360 mg by mouth daily.  . DULoxetine (CYMBALTA) 60 MG capsule Take 60 mg by mouth 2 (two) times daily.  Marland Kitchen gabapentin (NEURONTIN) 300 MG capsule Take 1,200 mg by mouth at bedtime as needed (pain.).   Marland Kitchen lisinopril (PRINIVIL,ZESTRIL) 40 MG tablet Take 1 tablet (40 mg total) by mouth daily.  . metoprolol succinate  (TOPROL XL) 25 MG 24 hr tablet Take 1 tablet (25 mg total) by mouth daily. Take an extra as needed for elevated heart rate (Patient taking differently: Take 25 mg by mouth every evening. )  . montelukast (SINGULAIR) 10 MG tablet Take 10 mg by mouth at bedtime as needed (allergies).   Marland Kitchen omeprazole (PRILOSEC) 20 MG capsule Take 20 mg by mouth daily as needed (acid reflux/indigestion.).   Marland Kitchen rOPINIRole (REQUIP) 1 MG tablet Take 1-2 mg by mouth at bedtime as needed (restless leg syndrome). 1-3 hours prior to bedtime   . sildenafil (REVATIO) 20 MG tablet Take 40-100 mg by mouth daily as needed.  . warfarin (COUMADIN) 5 MG tablet TAKE 1 TO 1 AND 1/2 TABLETS BY MOUTH DAILY AS DIRECTED BY COUMADIN CLINIC (Patient taking differently: Take 5-7.5 mg by mouth See admin instructions. Take 1.5 tablets (7.5 mg) by mouth on Mondays, Wednesdays, & Fridays. Take 1 tablet (5 mg) by mouth on Sundays, Tuesdays, Thursdays, & Saturdays.)     Allergies:   Patient has no known allergies.   Social History   Socioeconomic History  . Marital status: Married    Spouse name: Not on file  . Number of children: Not on file  . Years of education: Not on file  . Highest education level: Not on file  Occupational History  . 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 Financial resource strain: Not on file  . Food insecurity    Worry: Not on file    Inability: Not on file  . Transportation needs    Medical: Not on file    Non-medical: Not on file  Tobacco Use  . Smoking status: Never Smoker  . Smokeless tobacco: Never Used  Substance and Sexual Activity  . Alcohol use: No    Alcohol/week: 0.0 standard drinks  . Drug use: No  . Sexual activity: Yes  Lifestyle  . Physical activity    Days per week: Not on file    Minutes per session: Not on file  . Stress: Not on file  Relationships  . Social Herbalist on phone: Not on file    Gets together: Not on file    Attends religious service: Not  on file    Active member of club or organization: Not on file    Attends meetings of clubs or organizations: Not on file    Relationship status: Not on file  Other Topics Concern  . Not on file  Social History Narrative  . Not on file     Family History: The patient's family history includes Asthma in his mother; Breast cancer in his mother; COPD in his father; Lymphoma in his sister.  ROS:   Please see the history of present illness.    Pt snores, has HTN, obesity, arrhythmia, and daytime fatigue  All other  systems reviewed and are negative.  EKGs/Labs/Other Studies Reviewed:    The following studies were reviewed today: Echo sept 2018  EKG:  EKG is ordered today.  The ekg ordered today demonstrates AF with VR 93  Recent Labs: 11/17/2018: B Natriuretic Peptide 35.7; BUN 20; Creatinine, Ser 0.78; Hemoglobin 11.1; Platelets 393; Potassium 4.1; Sodium 137  Recent Lipid Panel    Component Value Date/Time   CHOL  02/14/2009 0630    126        ATP III CLASSIFICATION:  <200     mg/dL   Desirable  200-239  mg/dL   Borderline High  >=240    mg/dL   High          TRIG 95 02/14/2009 0630   HDL 48 02/14/2009 0630   CHOLHDL 2.6 02/14/2009 0630   VLDL 19 02/14/2009 0630   LDLCALC  02/14/2009 0630    59        Total Cholesterol/HDL:CHD Risk Coronary Heart Disease Risk Table                     Men   Women  1/2 Average Risk   3.4   3.3  Average Risk       5.0   4.4  2 X Average Risk   9.6   7.1  3 X Average Risk  23.4   11.0        Use the calculated Patient Ratio above and the CHD Risk Table to determine the patient's CHD Risk.        ATP III CLASSIFICATION (LDL):  <100     mg/dL   Optimal  100-129  mg/dL   Near or Above                    Optimal  130-159  mg/dL   Borderline  160-189  mg/dL   High  >190     mg/dL   Very High    Physical Exam:    VS:  BP (!) 143/92     Wt Readings from Last 3 Encounters:  02/11/19 (!) 319 lb (144.7 kg)  11/19/18 297 lb (134.7  kg)  11/17/18 (!) 304 lb (137.9 kg)     GEN: Obese Caucasian male, in no acute distress HEENT: Normal NECK: No JVD; No carotid bruits LYMPHATICS: No lymphadenopathy CARDIAC: irregularly irregular with 2/6 MR murmur RESPIRATORY:  Clear to auscultation without rales, wheezing or rhonchi  ABDOMEN: Soft, non-tender, non-distended MUSCULOSKELETAL:  1+ pre tibial pitting edema No deformity  SKIN: Warm and dry NEUROLOGIC:  Alert and oriented x 3 PSYCHIATRIC:  Normal affect   ASSESSMENT:    PAF (paroxysmal atrial fibrillation) (HCC) Recurrent symptomatic PAF- scheduled for TEE DCCV 8/28  Anticoagulated On Coumadin-CHADS VASC=2  Essential hypertension Echo 2018- normal LVF, normal LA  Morbid obesity (HCC) BMI 37- suspected sleep apnea- sleep study ordered.   Acute CHF ([place]) Secondary to AF  PLAN:    I will give him a dose of Lasix 40 mg tonight with KCL 20 MEq.  Labs ordered STAT for TEE CV tomorrow.  He may need a diuretic for a while post cardioversion but will defer to dr Stanford Breed.  I will order sleep study if that has not already been done.     Medication Adjustments/Labs and Tests Ordered: Current medicines are reviewed at length with the patient today.  Concerns regarding medicines are outlined above.  No orders of the defined types were placed in  this encounter.  No orders of the defined types were placed in this encounter.   Patient Instructions  Medication Instructions:  Take 1 dose of Lasix 40mg  today only Take 1 dose of Potassium 6meq today only with Lasix If you need a refill on your cardiac medications before your next appointment, please call your pharmacy.   Lab work: Your physician recommends that you return for lab work in: TODAY-BMET, MAG, CBC If you have labs (blood work) drawn today and your tests are completely normal, you will receive your results only by: Marland Kitchen MyChart Message (if you have MyChart) OR . A paper copy in the mail If you have any lab  test that is abnormal or we need to change your treatment, we will call you to review the results.  Testing/Procedures: Your physician has recommended that you have a sleep study. This test records several body functions during sleep, including: brain activity, eye movement, oxygen and carbon dioxide blood levels, heart rate and rhythm, breathing rate and rhythm, the flow of air through your mouth and nose, snoring, body muscle movements, and chest and belly movement. WANDA OUR SLEEP COORDINATOR WILL BE IN CONTACT WITH YOU ABOUT YOUR SLEEP STUDY.    Follow-Up: At [facility], you and your health needs are our priority.  As part of our continuing mission to provide you with exceptional heart care, we have created designated Provider Care Teams.  These Care Teams include your primary Cardiologist (physician) and Advanced Practice Providers (APPs -  Physician Assistants and Nurse Practitioners) who all work together to provide you with the care you need, when you need it. You will need a follow up appointment in 3-4 weeks.  Please call our office 2 months in advance to schedule this appointment.  You may see Kirk Ruths, MD ONLY or one of the following Advanced Practice Providers on your designated Care Team:   Kerin Ransom, PA-C Roby Lofts, [place] . Sande Rives, PA-C  Any Other Special Instructions Will Be Listed Below (If Applicable).      Signed, Kerin Ransom, PA-C  03/03/2019 3:36 PM    [facility]

## 2019-03-03 NOTE — Assessment & Plan Note (Addendum)
On Coumadin-CHADS VASC=2

## 2019-03-04 ENCOUNTER — Ambulatory Visit (HOSPITAL_COMMUNITY): Payer: 59 | Admitting: Anesthesiology

## 2019-03-04 ENCOUNTER — Telehealth: Payer: Self-pay | Admitting: *Deleted

## 2019-03-04 ENCOUNTER — Other Ambulatory Visit: Payer: Self-pay | Admitting: Cardiology

## 2019-03-04 ENCOUNTER — Other Ambulatory Visit: Payer: Self-pay

## 2019-03-04 ENCOUNTER — Ambulatory Visit (HOSPITAL_COMMUNITY)
Admission: RE | Admit: 2019-03-04 | Discharge: 2019-03-04 | Disposition: A | Discharge status: Home / Self Care | Payer: 59 | Attending: Cardiology | Admitting: Cardiology

## 2019-03-04 ENCOUNTER — Encounter (HOSPITAL_COMMUNITY)
Admission: RE | Disposition: A | Discharge status: Home / Self Care | Payer: Self-pay | Source: Home / Self Care | Attending: Cardiology

## 2019-03-04 ENCOUNTER — Ambulatory Visit (HOSPITAL_BASED_OUTPATIENT_CLINIC_OR_DEPARTMENT_OTHER)
Admission: RE | Admit: 2019-03-04 | Discharge: 2019-03-04 | Disposition: A | Discharge status: Home / Self Care | Payer: 59 | Source: Ambulatory Visit | Attending: Cardiology | Admitting: Cardiology

## 2019-03-04 ENCOUNTER — Encounter (HOSPITAL_COMMUNITY): Payer: Self-pay | Admitting: *Deleted

## 2019-03-04 ENCOUNTER — Telehealth: Payer: Self-pay

## 2019-03-04 DIAGNOSIS — I480 Paroxysmal atrial fibrillation: Secondary | ICD-10-CM

## 2019-03-04 DIAGNOSIS — I509 Heart failure, unspecified: Secondary | ICD-10-CM | POA: Insufficient documentation

## 2019-03-04 DIAGNOSIS — I4819 Other persistent atrial fibrillation: Secondary | ICD-10-CM | POA: Diagnosis not present

## 2019-03-04 DIAGNOSIS — I110 Hypertensive heart disease with heart failure: Secondary | ICD-10-CM | POA: Insufficient documentation

## 2019-03-04 DIAGNOSIS — R0683 Snoring: Secondary | ICD-10-CM | POA: Insufficient documentation

## 2019-03-04 DIAGNOSIS — Z6837 Body mass index (BMI) 37.0-37.9, adult: Secondary | ICD-10-CM | POA: Diagnosis not present

## 2019-03-04 DIAGNOSIS — F329 Major depressive disorder, single episode, unspecified: Secondary | ICD-10-CM | POA: Insufficient documentation

## 2019-03-04 DIAGNOSIS — I340 Nonrheumatic mitral (valve) insufficiency: Secondary | ICD-10-CM

## 2019-03-04 DIAGNOSIS — Z7901 Long term (current) use of anticoagulants: Secondary | ICD-10-CM | POA: Diagnosis not present

## 2019-03-04 DIAGNOSIS — I428 Other cardiomyopathies: Secondary | ICD-10-CM | POA: Insufficient documentation

## 2019-03-04 DIAGNOSIS — Z79899 Other long term (current) drug therapy: Secondary | ICD-10-CM | POA: Insufficient documentation

## 2019-03-04 DIAGNOSIS — E785 Hyperlipidemia, unspecified: Secondary | ICD-10-CM | POA: Diagnosis not present

## 2019-03-04 DIAGNOSIS — I10 Essential (primary) hypertension: Secondary | ICD-10-CM | POA: Insufficient documentation

## 2019-03-04 DIAGNOSIS — Z5181 Encounter for therapeutic drug level monitoring: Secondary | ICD-10-CM

## 2019-03-04 HISTORY — PX: TEE WITHOUT CARDIOVERSION: SHX5443

## 2019-03-04 HISTORY — PX: CARDIOVERSION: SHX1299

## 2019-03-04 SURGERY — ECHOCARDIOGRAM, TRANSESOPHAGEAL
Anesthesia: Monitor Anesthesia Care

## 2019-03-04 MED ORDER — PROPOFOL 500 MG/50ML IV EMUL
INTRAVENOUS | Status: DC | PRN
  Administered 2019-03-04: 125 ug/kg/min via INTRAVENOUS

## 2019-03-04 MED ORDER — BUTAMBEN-TETRACAINE-BENZOCAINE 2-2-14 % EX AERO
INHALATION_SPRAY | CUTANEOUS | Status: DC | PRN
  Administered 2019-03-04: 08:00:00 2 via TOPICAL

## 2019-03-04 MED ORDER — POTASSIUM CHLORIDE CRYS ER 20 MEQ PO TBCR: EXTENDED_RELEASE_TABLET | ORAL | 5 refills | Status: DC

## 2019-03-04 MED ORDER — PROPOFOL 10 MG/ML IV BOLUS
INTRAVENOUS | Status: DC | PRN
  Administered 2019-03-04 (×2): 15 mg via INTRAVENOUS
  Administered 2019-03-04: 20 mg via INTRAVENOUS

## 2019-03-04 MED ORDER — MIDAZOLAM HCL 2 MG/2ML IJ SOLN
INTRAMUSCULAR | Status: DC | PRN
  Administered 2019-03-04: 2 mg via INTRAVENOUS

## 2019-03-04 MED ORDER — FUROSEMIDE 40 MG PO TABS: ORAL_TABLET | ORAL | 5 refills | Status: DC

## 2019-03-04 MED ORDER — SODIUM CHLORIDE 0.9 % IV SOLN
INTRAVENOUS | Status: DC
  Administered 2019-03-04: 08:00:00 via INTRAVENOUS

## 2019-03-04 NOTE — Telephone Encounter (Signed)
PA submitted for sleep study to [facility] via web portal.

## 2019-03-04 NOTE — Telephone Encounter (Signed)
I spoke to the patient's wife and shared [name]' recommendations along with Dr Stanford Breed.

## 2019-03-04 NOTE — Anesthesia Postprocedure Evaluation (Signed)
Anesthesia Post Note  Patient: [name]  Procedure(s) Performed: TRANSESOPHAGEAL ECHOCARDIOGRAM (TEE) (N/A ) CARDIOVERSION (N/A )     Patient location during evaluation: PACU Anesthesia Type: MAC Level of consciousness: awake and alert Pain management: pain level controlled Vital Signs Assessment: post-procedure vital signs reviewed and stable Respiratory status: spontaneous breathing, nonlabored ventilation, respiratory function stable and patient connected to nasal cannula oxygen Cardiovascular status: stable and blood pressure returned to baseline Postop Assessment: no apparent nausea or vomiting Anesthetic complications: no    Last Vitals:  Vitals:   03/04/19 0845 03/04/19 0853  BP: (!) 142/88 (!) 146/93  Pulse: 85 83  Resp: 19 18  Temp:    SpO2: 96% 97%    Last Pain:  Vitals:   03/04/19 0853  TempSrc:   PainSc: 0-No pain                 Maryagnes Carrasco

## 2019-03-04 NOTE — Telephone Encounter (Signed)
-----   Message from Consuelo Pandy, [place] sent at 03/04/2019  8:08 AM EDT ----- Regarding: Referral to Dr. Rayann Heman Dr. Stanford Breed has requested referral to Dr. Rayann Heman for atrial fibrillation ablation. Can you please call and set up an appointment?  He also needs a BMP next week. I have placed order for Thursday 03/10/19. Thank you!!

## 2019-03-04 NOTE — Interval H&P Note (Signed)
History and Physical Interval Note:  03/04/2019 7:18 AM  [name]  has presented today for surgery, with the diagnosis of AFIB.  The various methods of treatment have been discussed with the patient and family. After consideration of risks, benefits and other options for treatment, the patient has consented to  Procedure(s): TRANSESOPHAGEAL ECHOCARDIOGRAM (TEE) (N/A) CARDIOVERSION (N/A) as a surgical intervention.  The patient's history has been reviewed, patient examined, no change in status, stable for surgery.  I have reviewed the patient's chart and labs.  Questions were answered to the patient's satisfaction.     Kirk Ruths

## 2019-03-04 NOTE — Anesthesia Preprocedure Evaluation (Addendum)
Anesthesia Evaluation  Patient identified by MRN, date of birth, ID band Patient awake    Reviewed: Allergy & Precautions, NPO status , Patient's Chart, lab work & pertinent test results  History of Anesthesia Complications (+) PONV, PROLONGED EMERGENCE and history of anesthetic complications  Airway Mallampati: III  TM Distance: >3 FB Neck ROM: Full    Dental  (+) Dental Advisory Given, Chipped, Teeth Intact   Pulmonary neg pulmonary ROS, COPD,  COPD inhaler,    Pulmonary exam normal        Cardiovascular hypertension, Pt. on medications (-) anginanegative cardio ROS  + dysrhythmias Atrial Fibrillation  Rhythm:Regular Rate:Normal  9/18 ECHO: EF 60-65%, valves OK   Neuro/Psych PSYCHIATRIC DISORDERS Depression negative neurological ROS  negative psych ROS   GI/Hepatic negative GI ROS, Neg liver ROS, (+)     substance abuse (narcotic)  ,   Endo/Other  negative endocrine ROSMorbid obesity  Renal/GU negative Renal ROS  negative genitourinary   Musculoskeletal negative musculoskeletal ROS (+) narcotic dependent  Abdominal (+) + obese,   Peds negative pediatric ROS (+)  Hematology negative hematology ROS (+) Xarelto   Anesthesia Other Findings   Reproductive/Obstetrics negative OB ROS                            Anesthesia Physical  Anesthesia Plan  ASA: III  Anesthesia Plan: MAC   Post-op Pain Management:    Induction: Intravenous  PONV Risk Score and Plan: 2 and Treatment may vary due to age or medical condition and TIVA  Airway Management Planned: Natural Airway and Mask  Additional Equipment:   Intra-op Plan:   Post-operative Plan:   Informed Consent: I have reviewed the patients History and Physical, chart, labs and discussed the procedure including the risks, benefits and alternatives for the proposed anesthesia with the patient or authorized representative who has  indicated his/her understanding and acceptance.     Dental advisory given  Plan Discussed with: CRNA, Surgeon and Anesthesiologist  Anesthesia Plan Comments:         Anesthesia Quick Evaluation

## 2019-03-04 NOTE — Progress Notes (Signed)
*  PRELIMINARY RESULTS* Echocardiogram Echocardiogram Transesophageal has been performed.  [name] 03/04/2019, 8:37 AM

## 2019-03-04 NOTE — Progress Notes (Signed)
Order for BMP for 9/3 placed.

## 2019-03-04 NOTE — Anesthesia Procedure Notes (Addendum)
Procedure Name: General with mask airway Date/Time: 03/04/2019 8:14 AM Performed by: Elayne Snare, CRNA Pre-anesthesia Checklist: Patient identified, Emergency Drugs available, Suction available and Patient being monitored Patient Re-evaluated:Patient Re-evaluated prior to induction Oxygen Delivery Method: Nasal cannula

## 2019-03-04 NOTE — Transfer of Care (Signed)
Immediate Anesthesia Transfer of Care Note  Patient: [name]  Procedure(s) Performed: TRANSESOPHAGEAL ECHOCARDIOGRAM (TEE) (N/A ) CARDIOVERSION (N/A )  Patient Location: Endoscopy Unit  Anesthesia Type:General  Level of Consciousness: awake, alert  and patient cooperative  Airway & Oxygen Therapy: Patient Spontanous Breathing and Patient connected to nasal cannula oxygen  Post-op Assessment: Report given to RN and Post -op Vital signs reviewed and stable  Post vital signs: Reviewed and stable  Last Vitals:  Vitals Value Taken Time  BP 145/93 03/04/19 0833  Temp    Pulse 84 03/04/19 0833  Resp 26 03/04/19 0833  SpO2 98 % 03/04/19 0833  Vitals shown include unvalidated device data.  Last Pain:  Vitals:   03/04/19 0720  TempSrc: Temporal  PainSc: 0-No pain         Complications: No apparent anesthesia complications

## 2019-03-04 NOTE — Progress Notes (Addendum)
    Transesophageal Echocardiogram Note  [name] [id number] 08-28-1955  Procedure: Transesophageal Echocardiogram Indications: atrial fibrillation  Procedure Details Consent: Obtained Time Out: Verified patient identification, verified procedure, site/side was marked, verified correct patient position, special equipment/implants available, Radiology Safety Procedures followed,  medications/allergies/relevent history reviewed, required imaging and test results available.  Performed  Medications:  Pt sedated by anesthesia with versed 2 mg and diprovan 300 mg IV.  Normal LV function; severe LAE; no LAA thrombus; mild RAE; moderately severe (3+) MR; mild TR.  Pt subsequently underwent DCCV with 120 J unsuccessfully; second attempt with 150J converted to NSR; no immediate complications; continue coumadin.  Complications: No apparent complications Patient did tolerate procedure well.   Pt volume overloaded; take lasix 40 mg and kdur 20 meq daily for 4 days and then daily as needed for lower ext edema/dyspnea; bmet one week; refer to Dr Rayann Heman for consideration of repeat atrial fibrillation ablation vs antiarrythmic therapy. There is 3+ MR; will need fu echo in 3 months to see if MR improved following reestablishing sinus. FU with me as scheduled. Pt instructed to fu with primary care for mild microcytic anemia.  Kirk Ruths, MD

## 2019-03-04 NOTE — Discharge Instructions (Signed)
TEE  YOU HAD AN CARDIAC PROCEDURE TODAY: Refer to the procedure report and other information in the discharge instructions given to you for any specific questions about what was found during the examination. If this information does not answer your questions, please call [facility] office at [phone] to clarify.   DIET: Your first meal following the procedure should be a light meal and then it is ok to progress to your normal diet. A half-sandwich or bowl of soup is an example of a good first meal. Heavy or fried foods are harder to digest and may make you feel nauseous or bloated. Drink plenty of fluids but you should avoid alcoholic beverages for 24 hours. If you had a esophageal dilation, please see attached instructions for diet.   ACTIVITY: Your care partner should take you home directly after the procedure. You should plan to take it easy, moving slowly for the rest of the day. You can resume normal activity the day after the procedure however YOU SHOULD NOT DRIVE, use power tools, machinery or perform tasks that involve climbing or major physical exertion for 24 hours (because of the sedation medicines used during the test).   SYMPTOMS TO REPORT IMMEDIATELY: A cardiologist can be reached at any hour. Please call [phone] for any of the following symptoms:  Vomiting of blood or coffee ground material  New, significant abdominal pain  New, significant chest pain or pain under the shoulder blades  Painful or persistently difficult swallowing  New shortness of breath  Black, tarry-looking or red, bloody stools  FOLLOW UP:  Please also call with any specific questions about appointments or follow up tests.   Electrical Cardioversion, Care After This sheet gives you information about how to care for yourself after your procedure. Your health care provider may also give you more specific instructions. If you have problems or questions, contact your health care provider. What can I  expect after the procedure? After the procedure, it is common to have:  Some redness on the skin where the shocks were given. Follow these instructions at home:   Do not drive for 24 hours if you were given a medicine to help you relax (sedative).  Take over-the-counter and prescription medicines only as told by your health care provider.  Ask your health care provider how to check your pulse. Check it often.  Rest for 48 hours after the procedure or as told by your health care provider.  Avoid or limit your caffeine use as told by your health care provider. Contact a health care provider if:  You feel like your heart is beating too quickly or your pulse is not regular.  You have a serious muscle cramp that does not go away. Get help right away if:   You have discomfort in your chest.  You are dizzy or you feel faint.  You have trouble breathing or you are short of breath.  Your speech is slurred.  You have trouble moving an arm or leg on one side of your body.  Your fingers or toes turn cold or blue. This information is not intended to replace advice given to you by your health care provider. Make sure you discuss any questions you have with your health care provider. Document Released: 04/13/2013 Document Revised: 06/05/2017 Document Reviewed: 12/28/2015 Elsevier Patient Education  2020 [place] will need to get repeat lab work in 1 week at Dr. Jacalyn Lefevre office at 03/10/19.

## 2019-03-04 NOTE — H&P (Signed)
[name]  Physician Assistant  Cardiology  Progress Notes    Signed  Encounter Date:  03/03/2019      Related encounter: Office Visit from 03/03/2019 in [place] All Collapse All    Show:Clear all [x] Manual[x] Template[x] Copied  Added by: [x] Clinton Quan, PA-C  [] Hover for details  Cardiology Office Note:    Date:  03/03/2019   ID:  [name], DOB [birth year], MRN [id number]  PCP:  Clinton Dus, MD       Cardiologist:  Clinton Ruths, MD  Electrophysiologist:  None   Referring MD: Clinton Dus, MD      Chief Complaint  Patient presents with  . Follow-up    History of Present Illness:    [name] is a 63 y.o. male with a hx of PAF, HTN, obesity, and suspected sleep apnea.  He had an ablation at [facility] in 2007.  He subsequently had cardioversions in the interim.  Records indicate he is failed flecainide, sotalol, and Tikosyn.  He has been on anticoagulation, he could not afford Xarelto and was recently started on Coumadin for recurrent PAF.  Echocardiogram in September 2018 showed normal LV function with normal LA size.  Around 1 August he began to have symptoms consistent with his atrial fibrillation, he had dyspnea on exertion and weight gain.  He was seen in the atrial fibrillation clinic.  Plan was for 4 weeks of therapeutic Coumadin and then outpatient DC cardioversion.  In the interim the patient has developed increasing shortness of breath and orthopnea.  [name] had him set up for TEE cardioversion tomorrow and he is in the office today for evaluation prior to that.  Patient admits to increasing lower extremity edema and increasing dyspnea.  His EKG shows atrial fibrillation with a ventricular response in the 90s.      Past Medical History:  Diagnosis Date  . Allergic rhinitis   . Asthma    as a child  . Complication of anesthesia    "hard to put asleep, hard to wake  up, nausea and vomiting"  . Depression   . History of echocardiogram    Echocardiogram 9/18: EF 60-65, normal wall motion  . HLD (hyperlipidemia)   . HTN (hypertension)    sees Dr. Maury [name], [facility]  . Hypertrophy of prostate with urinary obstruction and other lower urinary tract symptoms (LUTS)   . Inguinal hernia without mention of obstruction or gangrene, unilateral or unspecified, (not specified as recurrent)   . Lumbar herniated disc    L7  . Narcotic addiction ([place])   . NICM (nonischemic cardiomyopathy) ([place])   . PAF (paroxysmal atrial fibrillation) ([place])    ablation done 03/2006, sees Dr. Ginger [name], [name]  . Pneumonia    hx of 2004  . PONV (postoperative nausea and vomiting)          Past Surgical History:  Procedure Laterality Date  . ATRIAL ABLATION SURGERY  2007   [facility]  . CARDIOVERSION  08/11/2011   Procedure: CARDIOVERSION;  Surgeon: Lelon Perla, MD;  Location: [place];  Service: Cardiovascular;  Laterality: N/A;  . CARDIOVERSION N/A 04/16/2017   Procedure: CARDIOVERSION;  Surgeon: Sanda Klein, MD;  Location: [place] ENDOSCOPY;  Service: Cardiovascular;  Laterality: N/A;  . EYE SURGERY     lasik, 1998  . HERNIA REPAIR     1977  . INGUINAL HERNIA REPAIR Right 08/26/2012  Procedure: LAPAROSCOPIC INGUINAL HERNIA;  Surgeon: Madilyn Hook, DO;  Location: [place];  Service: General;  Laterality: Right;  laparoscopic right inguinal hernia repair with mesh, umbilical hernia repair  . INSERTION OF MESH Right 08/26/2012   Procedure: INSERTION OF MESH;  Surgeon: Madilyn Hook, DO;  Location: [place];  Service: General;  Laterality: Right;  right inguinal hernia  . UMBILICAL HERNIA REPAIR N/A 08/26/2012   Procedure: HERNIA REPAIR UMBILICAL ADULT;  Surgeon: Madilyn Hook, DO;  Location: [place];  Service: General;  Laterality: N/A;    Current Medications: Active Medications      Current Meds  Medication Sig  . acetaminophen  (TYLENOL) 325 MG tablet Take 650 mg by mouth every 6 (six) hours as needed. For pain  . diltiazem (CARDIZEM CD) 360 MG 24 hr capsule Take 360 mg by mouth daily.  . DULoxetine (CYMBALTA) 60 MG capsule Take 60 mg by mouth 2 (two) times daily.  Marland Kitchen gabapentin (NEURONTIN) 300 MG capsule Take 1,200 mg by mouth at bedtime as needed (pain.).   Marland Kitchen lisinopril (PRINIVIL,ZESTRIL) 40 MG tablet Take 1 tablet (40 mg total) by mouth daily.  . metoprolol succinate (TOPROL XL) 25 MG 24 hr tablet Take 1 tablet (25 mg total) by mouth daily. Take an extra as needed for elevated heart rate (Patient taking differently: Take 25 mg by mouth every evening. )  . montelukast (SINGULAIR) 10 MG tablet Take 10 mg by mouth at bedtime as needed (allergies).   Marland Kitchen omeprazole (PRILOSEC) 20 MG capsule Take 20 mg by mouth daily as needed (acid reflux/indigestion.).   Marland Kitchen rOPINIRole (REQUIP) 1 MG tablet Take 1-2 mg by mouth at bedtime as needed (restless leg syndrome). 1-3 hours prior to bedtime   . sildenafil (REVATIO) 20 MG tablet Take 40-100 mg by mouth daily as needed.  . warfarin (COUMADIN) 5 MG tablet TAKE 1 TO 1 AND 1/2 TABLETS BY MOUTH DAILY AS DIRECTED BY COUMADIN CLINIC (Patient taking differently: Take 5-7.5 mg by mouth See admin instructions. Take 1.5 tablets (7.5 mg) by mouth on Mondays, Wednesdays, & Fridays. Take 1 tablet (5 mg) by mouth on Sundays, Tuesdays, Thursdays, & Saturdays.)       Allergies:   Patient has no known allergies.   Social History        Socioeconomic History  . Marital status: Married    Spouse name: Not on file  . Number of children: Not on file  . Years of education: Not on file  . Highest education level: Not on file  Occupational History  . 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 Financial resource strain: Not on file  . Food insecurity    Worry: Not on file    Inability: Not on file  . Transportation needs    Medical: Not on file    Non-medical: Not on file   Tobacco Use  . Smoking status: Never Smoker  . Smokeless tobacco: Never Used  Substance and Sexual Activity  . Alcohol use: No    Alcohol/week: 0.0 standard drinks  . Drug use: No  . Sexual activity: Yes  Lifestyle  . Physical activity    Days per week: Not on file    Minutes per session: Not on file  . Stress: Not on file  Relationships  . Social Herbalist on phone: Not on file    Gets together: Not on file    Attends religious service: Not on file    Active member of  club or organization: Not on file    Attends meetings of clubs or organizations: Not on file    Relationship status: Not on file  Other Topics Concern  . Not on file  Social History Narrative  . Not on file     Family History: The patient's family history includes Asthma in his mother; Breast cancer in his mother; COPD in his father; Lymphoma in his sister.  ROS:   Please see the history of present illness.    Pt snores, has HTN, obesity, arrhythmia, and daytime fatigue  All other systems reviewed and are negative.  EKGs/Labs/Other Studies Reviewed:    The following studies were reviewed today: Echo sept 2018  EKG:  EKG is ordered today.  The ekg ordered today demonstrates AF with VR 93  Recent Labs: 11/17/2018: B Natriuretic Peptide 35.7; BUN 20; Creatinine, Ser 0.78; Hemoglobin 11.1; Platelets 393; Potassium 4.1; Sodium 137  Recent Lipid Panel Labs (Brief)           Component Value Date/Time   CHOL  02/14/2009 0630    126        ATP III CLASSIFICATION:  <200     mg/dL   Desirable  200-239  mg/dL   Borderline High  >=240    mg/dL   High          TRIG 95 02/14/2009 0630   HDL 48 02/14/2009 0630   CHOLHDL 2.6 02/14/2009 0630   VLDL 19 02/14/2009 0630   LDLCALC  02/14/2009 0630    59        Total Cholesterol/HDL:CHD Risk Coronary Heart Disease Risk Table                     Men   Women  1/2 Average Risk   3.4   3.3  Average Risk       5.0    4.4  2 X Average Risk   9.6   7.1  3 X Average Risk  23.4   11.0        Use the calculated Patient Ratio above and the CHD Risk Table to determine the patient's CHD Risk.        ATP III CLASSIFICATION (LDL):  <100     mg/dL   Optimal  100-129  mg/dL   Near or Above                    Optimal  130-159  mg/dL   Borderline  160-189  mg/dL   High  >190     mg/dL   Very High      Physical Exam:    VS:  BP (!) 143/92        Wt Readings from Last 3 Encounters:  02/11/19 (!) 319 lb (144.7 kg)  11/19/18 297 lb (134.7 kg)  11/17/18 (!) 304 lb (137.9 kg)     GEN: Obese Caucasian male, in no acute distress HEENT: Normal NECK: No JVD; No carotid bruits LYMPHATICS: No lymphadenopathy CARDIAC: irregularly irregular with 2/6 MR murmur RESPIRATORY:  Clear to auscultation without rales, wheezing or rhonchi  ABDOMEN: Soft, non-tender, non-distended MUSCULOSKELETAL:  1+ pre tibial pitting edema No deformity  SKIN: Warm and dry NEUROLOGIC:  Alert and oriented x 3 PSYCHIATRIC:  Normal affect   ASSESSMENT:    PAF (paroxysmal atrial fibrillation) (HCC) Recurrent symptomatic PAF- scheduled for TEE DCCV 8/28  Anticoagulated On Coumadin-CHADS VASC=2  Essential hypertension Echo 2018- normal LVF, normal LA  Morbid  obesity (HCC) BMI 37- suspected sleep apnea- sleep study ordered.   Acute CHF ([place]) Secondary to AF  PLAN:    I will give him a dose of Lasix 40 mg tonight with KCL 20 MEq.  Labs ordered STAT for TEE CV tomorrow.  He may need a diuretic for a while post cardioversion but will defer to dr [name]  I will order sleep study if that has not already been done.     Medication Adjustments/Labs and Tests Ordered: Current medicines are reviewed at length with the patient today.  Concerns regarding medicines are outlined above.  No orders of the defined types were placed in this encounter.  No orders of the defined types were placed in this encounter.    Patient Instructions  Medication Instructions:  Take 1 dose of Lasix 40mg  today only Take 1 dose of Potassium 80meq today only with Lasix If you need a refill on your cardiac medications before your next appointment, please call your pharmacy.   Lab work: Your physician recommends that you return for lab work in: TODAY-BMET, MAG, CBC If you have labs (blood work) drawn today and your tests are completely normal, you will receive your results only by:  [place] (if you have MyChart) OR  A paper copy in the mail If you have any lab test that is abnormal or we need to change your treatment, we will call you to review the results.  Testing/Procedures: Your physician has recommended that you have a sleep study. This test records several body functions during sleep, including: brain activity, eye movement, oxygen and carbon dioxide blood levels, heart rate and rhythm, breathing rate and rhythm, the flow of air through your mouth and nose, snoring, body muscle movements, and chest and belly movement. WANDA OUR SLEEP COORDINATOR WILL BE IN CONTACT WITH YOU ABOUT YOUR SLEEP STUDY.    Follow-Up: At [facility], you and your health needs are our priority. As part of our continuing mission to provide you with exceptional heart care, we have created designated Provider Care Teams. These Care Teams include your primary Cardiologist (physician) and Advanced Practice Providers (APPs -  Physician Assistants and Nurse Practitioners) who all work together to provide you with the care you need, when you need it.  You will need a follow up appointment in 3-4 weeks.  Please call our office 2 months in advance to schedule this appointment.  You may see Clinton Ruths, MD ONLY or one of the following Advanced Practice Providers on your designated Care Team:    Kerin Ransom, PA-C  Roby Lofts, PA-C  Sande Rives, [place]  Any Other Special Instructions Will Be Listed Below (If Applicable).       Signed, Kerin Ransom, PA-C  03/03/2019 3:36 PM    [facility]         For TEE/DCCV; INR therapeutic. No changes [name]

## 2019-03-06 ENCOUNTER — Encounter (HOSPITAL_COMMUNITY): Payer: Self-pay | Admitting: Cardiology

## 2019-03-07 ENCOUNTER — Other Ambulatory Visit (INDEPENDENT_AMBULATORY_CARE_PROVIDER_SITE_OTHER): Payer: 59

## 2019-03-07 DIAGNOSIS — I420 Dilated cardiomyopathy: Secondary | ICD-10-CM | POA: Diagnosis not present

## 2019-03-07 DIAGNOSIS — I080 Rheumatic disorders of both mitral and aortic valves: Secondary | ICD-10-CM | POA: Diagnosis not present

## 2019-03-07 DIAGNOSIS — I480 Paroxysmal atrial fibrillation: Secondary | ICD-10-CM

## 2019-03-07 DIAGNOSIS — R55 Syncope and collapse: Secondary | ICD-10-CM

## 2019-03-07 DIAGNOSIS — I10 Essential (primary) hypertension: Secondary | ICD-10-CM

## 2019-03-09 ENCOUNTER — Other Ambulatory Visit: Payer: Self-pay | Admitting: Cardiology

## 2019-03-09 ENCOUNTER — Telehealth: Payer: Self-pay | Admitting: *Deleted

## 2019-03-09 DIAGNOSIS — I10 Essential (primary) hypertension: Secondary | ICD-10-CM

## 2019-03-09 DIAGNOSIS — R0683 Snoring: Secondary | ICD-10-CM

## 2019-03-09 DIAGNOSIS — G4719 Other hypersomnia: Secondary | ICD-10-CM

## 2019-03-09 DIAGNOSIS — I480 Paroxysmal atrial fibrillation: Secondary | ICD-10-CM

## 2019-03-09 NOTE — Telephone Encounter (Signed)
-----   Message from [name], CMA sent at 02/11/2019  3:41 PM EDT ----- This patient has an order in for a sleep study per Clint Fenton, PA for witnessed apnea and snoring.  Can you please call him to schedule?  Thank you  Clinton  

## 2019-03-09 NOTE — Telephone Encounter (Signed)
Staff message sent to [facility] denied in lab sleep study. Ok to do HST if ordering provider agrees. No PA is required.

## 2019-03-10 ENCOUNTER — Other Ambulatory Visit: Payer: 59

## 2019-03-10 ENCOUNTER — Telehealth: Payer: Self-pay | Admitting: *Deleted

## 2019-03-10 NOTE — Telephone Encounter (Signed)
Correction to previous message. Patient's [employer] not [employer] denied in lab sleep study. HST ordered and does not require getting a PA.

## 2019-03-10 NOTE — Telephone Encounter (Signed)
Patient notified [employer] denied in lab sleep study, however they did approve HST. HST scheduled for 04/22/19 @ 12:30 pm. Patient aware of appointment.

## 2019-03-10 NOTE — Telephone Encounter (Signed)
-----   Message from Corinna Lines sent at 03/03/2019  3:48 PM EDT ----- Regarding: sleep study   This patient needs a sleep study.

## 2019-03-15 ENCOUNTER — Other Ambulatory Visit: Payer: 59

## 2019-03-17 ENCOUNTER — Telehealth: Payer: Self-pay

## 2019-03-17 NOTE — Telephone Encounter (Signed)
Spoke with pt regarding appt on 03/21/19. Pt stated he did not have any questions at this time. Pt was advise to check his vitals prior to his appt. Pts concerns were address

## 2019-03-21 ENCOUNTER — Telehealth: Payer: 59 | Admitting: Internal Medicine

## 2019-03-21 VITALS — Ht 77.0 in | Wt 327.0 lb

## 2019-03-22 ENCOUNTER — Telehealth: Payer: Self-pay | Admitting: Neurology

## 2019-03-22 NOTE — Telephone Encounter (Signed)
Scheduled video visit with patient over the phone. He is already set up for MyChart and has done video visits this way in the past.  Pt understands that although there may be some limitations with this type of visit, we will take all precautions to reduce any security or privacy concerns.  Pt understands that this will be treated like an in office visit and we will file with pt's insurance, and there may be a patient responsible charge related to this service.

## 2019-03-22 NOTE — Progress Notes (Signed)
Appointment cancelled by patient.

## 2019-03-23 ENCOUNTER — Telehealth: Payer: Self-pay

## 2019-03-23 ENCOUNTER — Encounter (HOSPITAL_BASED_OUTPATIENT_CLINIC_OR_DEPARTMENT_OTHER): Payer: Self-pay

## 2019-03-23 MED ORDER — WARFARIN SODIUM 5 MG PO TABS: 5.0000 mg | ORAL_TABLET | Freq: Every day | ORAL | 0 refills | Status: DC

## 2019-03-23 MED ORDER — WARFARIN SODIUM 5 MG PO TABS: 5.0000 mg | ORAL_TABLET | ORAL | 0 refills | Status: DC

## 2019-03-23 NOTE — Telephone Encounter (Signed)
Routed to anticoagulation clinic.

## 2019-03-23 NOTE — Addendum Note (Signed)
Addended by: Allean Found on: 03/23/2019 01:57 PM   Modules accepted: Orders

## 2019-03-23 NOTE — Telephone Encounter (Signed)
Dear [name]. pharmacy will not refill my Warfarin sodium 5mg  it has no refills and says the prescription has been closed out??? Stuff I take every day for years.  [facility] at [address]

## 2019-03-23 NOTE — Telephone Encounter (Signed)
I don't have any question here.

## 2019-03-24 ENCOUNTER — Encounter: Payer: Self-pay | Admitting: Neurology

## 2019-03-24 ENCOUNTER — Telehealth (INDEPENDENT_AMBULATORY_CARE_PROVIDER_SITE_OTHER): Payer: 59 | Admitting: Neurology

## 2019-03-24 DIAGNOSIS — R252 Cramp and spasm: Secondary | ICD-10-CM

## 2019-03-24 DIAGNOSIS — F419 Anxiety disorder, unspecified: Secondary | ICD-10-CM | POA: Diagnosis not present

## 2019-03-24 MED ORDER — ALPRAZOLAM 0.5 MG PO TABS: 0.5000 mg | ORAL_TABLET | Freq: Every evening | ORAL | 1 refills | Status: DC | PRN

## 2019-03-24 NOTE — Progress Notes (Signed)
PATIENT: [name] DOB: [birth year]  Virtual Visit via video  I connected with [name] on 03/24/19 at  by video and verified that I am speaking with the correct person using two identifiers.   I discussed the limitations, risks, security and privacy concerns of performing an evaluation and management service by video and the availability of in person appointments. I also discussed with the patient that there may be a patient responsible charge related to this service. The patient expressed understanding and agreed to proceed.  HISTORICAL  MANDIP FRAME  is a 63 year old male, accompanied by his wife, seen in request by primary care physician Dr. Alyson Ingles, [facility] [place] for evaluation of left jerking movement, patient also complains of significant anxiety, panic attack.  I have reviewed and summarized the referring note from the referring physician.  He had a past medical history of hypertension, atrial fibrillation, on Xarelto treatment, also hyperlipidemia,  He began to complains of significant anxiety beginning of 2020, frequent panic attacks, feels very uneasy, quizzy sensation, difficulty breathing, frightened sensation, difficulty sleeping, lasting 2 to 3 hours, Xanax as needed was helpful, he was having anxiety, panic attack 3-4 times each week.  About July 2020, he began to have different episode, he had uncontrollable left leg large amplitude jerking movement, lasting for few seconds, followed by another jerking movement, whole episode can last 30 minutes, there is usually no panic, anxiety symptoms associated with his uncontrollable left jumpy movement, he was started on Requip 1 mg 2 tablets today, which has been very helpful  He denies significant bilateral upper lower extremity paresthesia, denies gait abnormality, there is no typical restless leg symptoms of urge to move his legs while holding still,.  He has been taking Cymbalta 60 mg twice a day for his anxiety with  some help, he is hoping to see a psychiatrist, he is most worried about his worsening anxiety and panic attack  Observations/Objective: I have reviewed problem lists, medications, allergies.  Patient was awake alert oriented to history taking care of conversation, moving 4 extremities without difficulty,  Assessment and Plan: Anxiety, panic attack  Continue Cymbalta 60 mg twice a day  Refer to psychologist Left leg uncontrollable jerking movement  Laboratory evaluations to rule out metabolic etiology  MRI of the brain to rule out right hemisphere pathology  Follow Up Instructions:     I discussed the assessment and treatment plan with the patient. The patient was provided an opportunity to ask questions and all were answered. The patient agreed with the plan and demonstrated an understanding of the instructions.   The patient was advised to call back or seek an in-person evaluation if the symptoms worsen or if the condition fails to improve as anticipated.  I provided 30 minutes of non-face-to-face time during this encounter.  REVIEW OF SYSTEMS: Full 14 system review of systems performed and notable only for as above All other review of systems were negative.  ALLERGIES: Allergies  Allergen Reactions  . Hydrocodone Other (See Comments)    GI upset, dizziness    HOME MEDICATIONS: Current Outpatient Medications  Medication Sig Dispense Refill  . acetaminophen (TYLENOL) 325 MG tablet Take 650 mg by mouth every 6 (six) hours as needed. For pain    . ALPRAZolam (XANAX) 0.5 MG tablet Take 1 tablet (0.5 mg total) by mouth at bedtime as needed for anxiety. 30 tablet 1  . diltiazem (CARDIZEM CD) 360 MG 24 hr capsule Take 360 mg by mouth daily.    Marland Kitchen  DULoxetine (CYMBALTA) 60 MG capsule Take 60 mg by mouth 2 (two) times daily.    . furosemide (LASIX) 40 MG tablet Take 1 tablet a day for 4 days, then only take as needed for swelling. 30 tablet 5  . gabapentin (NEURONTIN) 300 MG capsule  Take 1,200 mg by mouth at bedtime as needed (pain.).     Marland Kitchen lisinopril (PRINIVIL,ZESTRIL) 40 MG tablet Take 1 tablet (40 mg total) by mouth daily. 30 tablet 12  . metoprolol succinate (TOPROL XL) 25 MG 24 hr tablet Take 1 tablet (25 mg total) by mouth daily. Take an extra as needed for elevated heart rate (Patient taking differently: Take 25 mg by mouth every evening. ) 180 tablet 3  . montelukast (SINGULAIR) 10 MG tablet Take 10 mg by mouth at bedtime as needed (allergies).     Marland Kitchen omeprazole (PRILOSEC) 20 MG capsule Take 20 mg by mouth daily as needed (acid reflux/indigestion.).     Marland Kitchen potassium chloride SA (K-DUR) 20 MEQ tablet Take 1 tablet daily for 4 days, then only take on days that you need to take a lasix tablet. 30 tablet 5  . rOPINIRole (REQUIP) 1 MG tablet Take 1-2 mg by mouth at bedtime as needed (restless leg syndrome). 1-3 hours prior to bedtime     . sildenafil (REVATIO) 20 MG tablet Take 40-100 mg by mouth daily as needed.    . warfarin (COUMADIN) 5 MG tablet Take 1 tablet (5 mg total) by mouth daily. 90 tablet 0   No current facility-administered medications for this visit.     PAST MEDICAL HISTORY: Past Medical History:  Diagnosis Date  . Allergic rhinitis   . Anxiety   . Asthma    as a child  . Chronic low back pain   . Complication of anesthesia    "hard to put asleep, hard to wake up, nausea and vomiting"  . Depression   . ED (erectile dysfunction)   . GERD (gastroesophageal reflux disease)   . History of echocardiogram    Echocardiogram 9/18: EF 60-65, normal wall motion  . HLD (hyperlipidemia)   . HTN (hypertension)    sees Dr. [name], [facility]  . Hypertrophy of prostate with urinary obstruction and other lower urinary tract symptoms (LUTS)   . IBS (irritable bowel syndrome)   . Inguinal hernia without mention of obstruction or gangrene, unilateral or unspecified, (not specified as recurrent)   . Insomnia   . Lumbar herniated disc    L7  .  Narcotic addiction ([place])   . NICM (nonischemic cardiomyopathy) ([place])   . PAF (paroxysmal atrial fibrillation) ([place])    ablation done 03/2006, sees Dr. Ginger Organ, [facility]  . Pneumonia    hx of 2004  . PONV (postoperative nausea and vomiting)   . Twitching    legs    PAST SURGICAL HISTORY: Past Surgical History:  Procedure Laterality Date  . ATRIAL ABLATION SURGERY  2007   [facility]  . CARDIOVERSION  08/11/2011   Procedure: CARDIOVERSION;  Surgeon: Lelon Perla, MD;  Location: [place];  Service: Cardiovascular;  Laterality: N/A;  . CARDIOVERSION N/A 04/16/2017   Procedure: CARDIOVERSION;  Surgeon: Sanda Klein, MD;  Location: [facility];  Service: Cardiovascular;  Laterality: N/A;  . CARDIOVERSION N/A 03/04/2019   Procedure: CARDIOVERSION;  Surgeon: Lelon Perla, MD;  Location: [facility] ENDOSCOPY;  Service: Cardiovascular;  Laterality: N/A;  . EYE SURGERY     [place], 1998  . HERNIA REPAIR  [place] Right 08/26/2012   Procedure: LAPAROSCOPIC INGUINAL HERNIA;  Surgeon: Madilyn Hook, DO;  Location: [place];  Service: General;  Laterality: Right;  laparoscopic right inguinal hernia repair with mesh, umbilical hernia repair  . INSERTION OF MESH Right 08/26/2012   Procedure: INSERTION OF MESH;  Surgeon: Madilyn Hook, DO;  Location: [place];  Service: General;  Laterality: Right;  right inguinal hernia  . TEE WITHOUT CARDIOVERSION N/A 03/04/2019   Procedure: TRANSESOPHAGEAL ECHOCARDIOGRAM (TEE);  Surgeon: Lelon Perla, MD;  Location: [place];  Service: Cardiovascular;  Laterality: N/A;  . UMBILICAL HERNIA REPAIR N/A 08/26/2012   Procedure: HERNIA REPAIR UMBILICAL ADULT;  Surgeon: Madilyn Hook, DO;  Location: [facility];  Service: General;  Laterality: N/A;    FAMILY HISTORY: Family History  Problem Relation Age of Onset  . Asthma Mother   . Breast cancer Mother   . Hypertension Mother   . COPD Father   . Prostate cancer Father   . Hypertension Father   .  Lymphoma Sister     SOCIAL HISTORY:   Social History   Socioeconomic History  . Marital status: Married    Spouse name: Not on file  . Number of children: 3  . Years of education: Not on file  . Highest education level: Not on file  Occupational History  . 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 Financial resource strain: Not on file  . Food insecurity    Worry: Not on file    Inability: Not on file  . Transportation needs    Medical: Not on file    Non-medical: Not on file  Tobacco Use  . Smoking status: Never Smoker  . Smokeless tobacco: Never Used  Substance and Sexual Activity  . Alcohol use: No    Alcohol/week: 0.0 standard drinks  . Drug use: No  . Sexual activity: Yes  Lifestyle  . Physical activity    Days per week: Not on file    Minutes per session: Not on file  . Stress: Not on file  Relationships  . Social Herbalist on phone: Not on file    Gets together: Not on file    Attends religious service: Not on file    Active member of club or organization: Not on file    Attends meetings of clubs or organizations: Not on file    Relationship status: Not on file  . Intimate partner violence    Fear of current or ex partner: Not on file    Emotionally abused: Not on file    Physically abused: Not on file    Forced sexual activity: Not on file  Other Topics Concern  . Not on file  Social History Narrative  . Not on file    Marcial Pacas, M.D. Ph.D.  [facility] [address] [place], [place] [address] Ph: [phone] Fax: [phone]  CC: [name], MD

## 2019-03-25 ENCOUNTER — Telehealth: Payer: Self-pay

## 2019-03-25 NOTE — Telephone Encounter (Signed)
Left a message regarding appt on 03/28/19.

## 2019-03-28 ENCOUNTER — Telehealth (INDEPENDENT_AMBULATORY_CARE_PROVIDER_SITE_OTHER): Payer: 59 | Admitting: Internal Medicine

## 2019-03-28 ENCOUNTER — Telehealth (HOSPITAL_COMMUNITY): Payer: Self-pay | Admitting: Physician Assistant

## 2019-03-28 ENCOUNTER — Other Ambulatory Visit (HOSPITAL_COMMUNITY): Payer: Self-pay | Admitting: *Deleted

## 2019-03-28 ENCOUNTER — Encounter: Payer: Self-pay | Admitting: Internal Medicine

## 2019-03-28 VITALS — BP 138/88 | HR 62 | Ht 77.0 in | Wt 290.0 lb

## 2019-03-28 DIAGNOSIS — I080 Rheumatic disorders of both mitral and aortic valves: Secondary | ICD-10-CM

## 2019-03-28 DIAGNOSIS — I4819 Other persistent atrial fibrillation: Secondary | ICD-10-CM

## 2019-03-28 NOTE — Telephone Encounter (Signed)
Called and left message for pt to call back.  Needs 2 mo f/u with [name] and an Echo 1 wk prior to that appt.

## 2019-03-28 NOTE — Progress Notes (Signed)
Electrophysiology TeleHealth Note   Due to national recommendations of social distancing due to [place] 19, Audio telehealth visit is felt to be most appropriate for this patient at this time.  Verbal consent was obtained from the patient.  He was unable to make video visit work today.   Date:  03/28/2019   ID:  [name], DOB 05-14-1956, MRN [id number]  Location: home Provider location: [facility] Evaluation Performed: New patient consult  PCP:  [name], MD  Cardiologist:  Kirk Ruths, MD  Electrophysiologist:  None   Chief Complaint:  afib  History of Present Illness:    [name] is a 63 y.o. male who presents via audio conferencing for a telehealth visit today.   The patient is referred for new consultation regarding afib by Dr Stanford Breed.  He has a h/o obesity and afib.  I saw him iin 2010.  At that time, he had failed medical therapy with tikosyn, flecainide, and sotalol.  He has had at least 4 cardioversions.  He underwent prior ablation in 2007 at [facility].  He declined repeat ablation with me. He reports doing well, without AAD therapy since then.  He has had rare episodes of afib.  He returned to afib in May.  He reports being in afib persistently until cardioversion in late August.  He was evaluated by Dr Stanford Breed and had developed CHF symptoms. TEE revealed severe LA enlargement with 3+ MR,  Preserved EF. LA size 2018 was 87mm. He reports significant life stress over the past 6 months.  He has moved and has had grown children with their life changes as well. Since his cardioversion in late august, he has done "perfect".  His CHF symptoms have resolved. Today, he denies symptoms of palpitations, chest pain, shortness of breath, orthopnea, PND, lower extremity edema, claudication, dizziness, presyncope, syncope, bleeding, or neurologic sequela.   He snores.   He has chronic pain and back issues.  He is not very active.  The patient is tolerating  medications without difficulties and is otherwise without complaint today.    Past Medical History:  Diagnosis Date  . Allergic rhinitis   . Anxiety   . Asthma    as a child  . Chronic low back pain   . Complication of anesthesia    "hard to put asleep, hard to wake up, nausea and vomiting"  . Depression   . ED (erectile dysfunction)   . GERD (gastroesophageal reflux disease)   . HLD (hyperlipidemia)   . HTN (hypertension)    sees Dr. [name], [facility]  . Hypertrophy of prostate with urinary obstruction and other lower urinary tract symptoms (LUTS)   . IBS (irritable bowel syndrome)   . Inguinal hernia without mention of obstruction or gangrene, unilateral or unspecified, (not specified as recurrent)   . Insomnia   . Lumbar herniated disc    L7  . Morbid obesity ([place])   . Narcotic addiction ([place])   . NICM (nonischemic cardiomyopathy) (HCC)    tachycardia mediated,  resolved with sinus  . Persistent atrial fibrillation    ablation done 03/2006 at [facility]  . Pneumonia    hx of 2004  . PONV (postoperative nausea and vomiting)   . Twitching    legs    Past Surgical History:  Procedure Laterality Date  . ATRIAL ABLATION SURGERY  2007   [facility]  . CARDIOVERSION  08/11/2011   Procedure: CARDIOVERSION;  Surgeon: Lelon Perla, MD;  Location: [place] OR;  Service: Cardiovascular;  Laterality: N/A;  . CARDIOVERSION N/A 04/16/2017   Procedure: CARDIOVERSION;  Surgeon: Sanda Klein, MD;  Location: [place] ENDOSCOPY;  Service: Cardiovascular;  Laterality: N/A;  . CARDIOVERSION N/A 03/04/2019   Procedure: CARDIOVERSION;  Surgeon: Lelon Perla, MD;  Location: [facility] ENDOSCOPY;  Service: Cardiovascular;  Laterality: N/A;  . EYE SURGERY     lasik, 1998  . HERNIA REPAIR     1977  . INGUINAL HERNIA REPAIR Right 08/26/2012   Procedure: LAPAROSCOPIC INGUINAL HERNIA;  Surgeon: Madilyn Hook, DO;  Location: [place];  Service: General;  Laterality: Right;  laparoscopic right inguinal hernia  repair with mesh, umbilical hernia repair  . INSERTION OF MESH Right 08/26/2012   Procedure: INSERTION OF MESH;  Surgeon: Madilyn Hook, DO;  Location: [place];  Service: General;  Laterality: Right;  right inguinal hernia  . TEE WITHOUT CARDIOVERSION N/A 03/04/2019   Procedure: TRANSESOPHAGEAL ECHOCARDIOGRAM (TEE);  Surgeon: Lelon Perla, MD;  Location: [place];  Service: Cardiovascular;  Laterality: N/A;  . UMBILICAL HERNIA REPAIR N/A 08/26/2012   Procedure: HERNIA REPAIR UMBILICAL ADULT;  Surgeon: Madilyn Hook, DO;  Location: [place];  Service: General;  Laterality: N/A;    Current Outpatient Medications  Medication Sig Dispense Refill  . acetaminophen (TYLENOL) 325 MG tablet Take 650 mg by mouth every 6 (six) hours as needed. For pain    . ALPRAZolam (XANAX) 0.5 MG tablet Take 1 tablet (0.5 mg total) by mouth at bedtime as needed for anxiety. 30 tablet 1  . diltiazem (CARDIZEM CD) 360 MG 24 hr capsule Take 360 mg by mouth daily.    . DULoxetine (CYMBALTA) 60 MG capsule Take 60 mg by mouth 2 (two) times daily.    . furosemide (LASIX) 40 MG tablet Take 1 tablet a day for 4 days, then only take as needed for swelling. 30 tablet 5  . gabapentin (NEURONTIN) 300 MG capsule Take 1,200 mg by mouth at bedtime as needed (pain.).     Marland Kitchen lisinopril (PRINIVIL,ZESTRIL) 40 MG tablet Take 1 tablet (40 mg total) by mouth daily. 30 tablet 12  . metoprolol succinate (TOPROL XL) 25 MG 24 hr tablet Take 1 tablet (25 mg total) by mouth daily. Take an extra as needed for elevated heart rate (Patient taking differently: Take 25 mg by mouth every evening. ) 180 tablet 3  . montelukast (SINGULAIR) 10 MG tablet Take 10 mg by mouth at bedtime as needed (allergies).     Marland Kitchen omeprazole (PRILOSEC) 20 MG capsule Take 20 mg by mouth daily as needed (acid reflux/indigestion.).     Marland Kitchen potassium chloride SA (K-DUR) 20 MEQ tablet Take 1 tablet daily for 4 days, then only take on days that you need to take a lasix tablet. 30  tablet 5  . rOPINIRole (REQUIP) 1 MG tablet Take 1-2 mg by mouth at bedtime as needed (restless leg syndrome). 1-3 hours prior to bedtime     . sildenafil (REVATIO) 20 MG tablet Take 40-100 mg by mouth daily as needed.    . warfarin (COUMADIN) 5 MG tablet Take 1 tablet (5 mg total) by mouth daily. 90 tablet 0   No current facility-administered medications for this visit.     Allergies:   Hydrocodone   Social History:  The patient  reports that he has never smoked. He has never used smokeless tobacco. He reports that he does not drink alcohol or use drugs.   Family History:  The patient's family history  includes Asthma in his mother; Breast cancer in his mother; COPD in his father; Hypertension in his father and mother; Lymphoma in his sister; Prostate cancer in his father.    ROS:  Please see the history of present illness.   All other systems are personally reviewed and negative.    Exam:    Vital Signs:  BP 138/88   Pulse 62   Ht 6\' 5"  (1.956 m)   Wt 290 lb (131.5 kg)   BMI 34.39 kg/m   Well appearing, regular work of breathing    Labs/Other Tests and Data Reviewed:    Recent Labs: 11/17/2018: B Natriuretic Peptide 35.7 03/03/2019: BUN 25; Creatinine, Ser 1.17; Hemoglobin 10.9; Magnesium 1.9; Platelets 320; Potassium 4.7; Sodium 141   Wt Readings from Last 3 Encounters:  03/28/19 290 lb (131.5 kg)  03/21/19 (!) 327 lb (148.3 kg)  03/04/19 (!) 329 lb (149.2 kg)     Other studies personally reviewed: Additional studies/ records that were reviewed today include: Dr Jacalyn Lefevre notes,  Recent TEE, echo 2018  Review of the above records today demonstrates: as above  ASSESSMENT & PLAN:    1.  Persistent afib He has severe LA enlargement and has failed medical therapy with multiple AADs.  He has done suprisingly well. I am not optimistic that ablation will be successful given his obesity and severe LA enlargement.  Lifestyle modification was discussed extensively today.    Repeat 2D echo in 2 months Consider surgical ablation, LAA removal, and valve repair depending on his MR at that time. chads2vasc score is 1.  He is on coumadin  2. Moderate to severe (3+) MR As above Repeat 2d echo in 2 months Refer to Dr Roxy Manns if MR persists  3. Morbid obesity Lifestyle modification is encouraged  4. HTN Stable No change required today   Follow-up:  2 months with AF clinic  Current medicines are reviewed at length with the patient today.   The patient does not have concerns regarding his medicines.  The following changes were made today:  none  Labs/ tests ordered today include:  No orders of the defined types were placed in this encounter.   Patient Risk:  after full review of this patients clinical status, I feel that they are at moderate risk at this time.   Today, I have spent 20 minutes with the patient with telehealth technology discussing afib .    Signed, Thompson Grayer MD, [place] 03/28/2019 9:32 AM   [facility] [address] [place] [address] [phone] (office) [phone] (fax)

## 2019-03-29 ENCOUNTER — Telehealth: Payer: Self-pay | Admitting: Neurology

## 2019-03-29 NOTE — Telephone Encounter (Signed)
LVM for pt to call back about schedule MRI [facility] Auth: [id number] (exp. 03/28/19 to 05/12/19)

## 2019-03-29 NOTE — Telephone Encounter (Signed)
Patient returned my call he is scheduled for 04/06/19 at [facility]. No to the covid questions. He did informed me he is slightly claustrophobic and would like something to help him.

## 2019-03-29 NOTE — Telephone Encounter (Signed)
I spoke to the patient.  He already has alprazolam 0.5mg  at home.  States he has plenty of supply - only takes it prn for anxiety.  He feels like one tablet will be plenty to relax him for the MRI.  His wife will be driving him to his appt.  He will not need an additional prescription.

## 2019-03-31 ENCOUNTER — Ambulatory Visit (INDEPENDENT_AMBULATORY_CARE_PROVIDER_SITE_OTHER): Payer: 59 | Admitting: Cardiology

## 2019-03-31 ENCOUNTER — Other Ambulatory Visit: Payer: Self-pay

## 2019-03-31 ENCOUNTER — Encounter: Payer: Self-pay | Admitting: Cardiology

## 2019-03-31 DIAGNOSIS — I10 Essential (primary) hypertension: Secondary | ICD-10-CM | POA: Diagnosis not present

## 2019-03-31 DIAGNOSIS — I480 Paroxysmal atrial fibrillation: Secondary | ICD-10-CM | POA: Diagnosis not present

## 2019-03-31 DIAGNOSIS — I340 Nonrheumatic mitral (valve) insufficiency: Secondary | ICD-10-CM

## 2019-03-31 DIAGNOSIS — Z7901 Long term (current) use of anticoagulants: Secondary | ICD-10-CM

## 2019-03-31 MED ORDER — FUROSEMIDE 40 MG PO TABS: ORAL_TABLET | ORAL | 5 refills | Status: DC

## 2019-03-31 MED ORDER — METOPROLOL SUCCINATE ER 25 MG PO TB24: 25.0000 mg | ORAL_TABLET | Freq: Every day | ORAL | 3 refills | Status: DC

## 2019-03-31 MED ORDER — POTASSIUM CHLORIDE CRYS ER 20 MEQ PO TBCR: EXTENDED_RELEASE_TABLET | ORAL | 5 refills | Status: DC

## 2019-03-31 NOTE — Assessment & Plan Note (Signed)
Controlled.  

## 2019-03-31 NOTE — Assessment & Plan Note (Signed)
Coumadin Rx 

## 2019-03-31 NOTE — Progress Notes (Signed)
Cardiology Office Note:    Date:  03/31/2019   ID:  [name], DOB [birth year], MRN [id number]  PCP:  [name], MD  Cardiologist:  Kirk Ruths, MD  Electrophysiologist:  Dr Rayann Heman  Referring MD: [name], MD   Chief Complaint  Patient presents with  . Follow-up  OP DCCV 03/04/2019  History of Present Illness:    [name] is a 63 y.o. male with a hx of  PAF, HTN, obesity, and suspected sleep apnea.  He had an ablation at [facility] in 2007.  He subsequently had cardioversions in the interim.  Records indicate he is failed flecainide, sotalol, and Tikosyn.  He has been on anticoagulation, he could not afford Xarelto and was recently started on Coumadin for recurrent PAF.  Echocardiogram in September 2018 showed normal LV function with normal LA size.  Around the first of August 2020 he began to have symptoms consistent with his atrial fibrillation, he had dyspnea on exertion and weight gain.  He was seen in the atrial fibrillation clinic.  Plan was for 4 weeks of therapeutic Coumadin and then outpatient DC cardioversion.  In the interim the patient developed increasing shortness of breath and orthopnea and  Dr. Stanford Breed set him up for TEE cardioversion which was done 03/04/2019.  TEE showed his EF to be 55-60%.  He was now noted to have severe LAE and moderate to severe MR.  The plan was for repeat echo after two months of NSR- see dr Jackalyn Lombard note 03/28/2019.  The patient presents today for routine follow up. He reported initially he was "doing well".  He is trying to get his insurance company to authorize an in house study.  He also says he is selling his [place] and he and his wife are moving.  He admits he is under increased stress.  On further interview he admitted to increased SOB getting out of the shower yesterday and he tells me he had to raise the head of his bed secondary to SOB last night. On exam he is back in AF.     Past Medical  History:  Diagnosis Date  . Allergic rhinitis   . Anxiety   . Asthma    as a child  . Chronic low back pain   . Complication of anesthesia    "hard to put asleep, hard to wake up, nausea and vomiting"  . Depression   . ED (erectile dysfunction)   . GERD (gastroesophageal reflux disease)   . HLD (hyperlipidemia)   . HTN (hypertension)    sees Dr. [name], [facility]  . Hypertrophy of prostate with urinary obstruction and other lower urinary tract symptoms (LUTS)   . IBS (irritable bowel syndrome)   . Inguinal hernia without mention of obstruction or gangrene, unilateral or unspecified, (not specified as recurrent)   . Insomnia   . Lumbar herniated disc    L7  . Morbid obesity ([place])   . Narcotic addiction ([place])   . NICM (nonischemic cardiomyopathy) (HCC)    tachycardia mediated,  resolved with sinus  . Persistent atrial fibrillation    ablation done 03/[facility]  . Pneumonia    hx of 2004  . PONV (postoperative nausea and vomiting)   . Twitching    legs    Past Surgical History:  Procedure Laterality Date  . ATRIAL ABLATION SURGERY  2007   [facility]  . CARDIOVERSION  08/11/2011   Procedure: CARDIOVERSION;  Surgeon: Lelon Perla,  MD;  Location: [place];  Service: Cardiovascular;  Laterality: N/A;  . CARDIOVERSION N/A 04/16/2017   Procedure: CARDIOVERSION;  Surgeon: Sanda Klein, MD;  Location: [facility] ENDOSCOPY;  Service: Cardiovascular;  Laterality: N/A;  . CARDIOVERSION N/A 03/04/2019   Procedure: CARDIOVERSION;  Surgeon: Lelon Perla, MD;  Location: [facility] ENDOSCOPY;  Service: Cardiovascular;  Laterality: N/A;  . EYE SURGERY     lasik, 1998  . HERNIA REPAIR     1977  . INGUINAL HERNIA REPAIR Right 08/26/2012   Procedure: LAPAROSCOPIC INGUINAL HERNIA;  Surgeon: Madilyn Hook, DO;  Location: [place];  Service: General;  Laterality: Right;  laparoscopic right inguinal hernia repair with mesh, umbilical hernia repair  . INSERTION OF MESH Right 08/26/2012   Procedure:  INSERTION OF MESH;  Surgeon: Madilyn Hook, DO;  Location: [place];  Service: General;  Laterality: Right;  right inguinal hernia  . TEE WITHOUT CARDIOVERSION N/A 03/04/2019   Procedure: TRANSESOPHAGEAL ECHOCARDIOGRAM (TEE);  Surgeon: Lelon Perla, MD;  Location: [place];  Service: Cardiovascular;  Laterality: N/A;  . UMBILICAL HERNIA REPAIR N/A 08/26/2012   Procedure: HERNIA REPAIR UMBILICAL ADULT;  Surgeon: Madilyn Hook, DO;  Location: [place];  Service: General;  Laterality: N/A;    Current Medications: Current Meds  Medication Sig  . acetaminophen (TYLENOL) 325 MG tablet Take 650 mg by mouth every 6 (six) hours as needed. For pain  . diltiazem (CARDIZEM CD) 360 MG 24 hr capsule Take 360 mg by mouth daily.  . DULoxetine (CYMBALTA) 60 MG capsule Take 60 mg by mouth 2 (two) times daily.  Marland Kitchen lisinopril (PRINIVIL,ZESTRIL) 40 MG tablet Take 1 tablet (40 mg total) by mouth daily.  . montelukast (SINGULAIR) 10 MG tablet Take 10 mg by mouth at bedtime as needed (allergies).   Marland Kitchen omeprazole (PRILOSEC) 20 MG capsule Take 20 mg by mouth daily as needed (acid reflux/indigestion.).   Marland Kitchen rOPINIRole (REQUIP) 1 MG tablet Take 1-2 mg by mouth at bedtime as needed (restless leg syndrome). 1-3 hours prior to bedtime   . sildenafil (REVATIO) 20 MG tablet Take 40-100 mg by mouth daily as needed.  . warfarin (COUMADIN) 5 MG tablet Take 1 tablet (5 mg total) by mouth daily.     Allergies:   Hydrocodone   Social History   Socioeconomic History  . Marital status: Married    Spouse name: Not on file  . Number of children: 3  . Years of education: Not on file  . Highest education level: Not on file  Occupational History  . 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 Financial resource strain: Not on file  . Food insecurity    Worry: Not on file    Inability: Not on file  . Transportation needs    Medical: Not on file    Non-medical: Not on file  Tobacco Use  . Smoking status: Never  Smoker  . Smokeless tobacco: Never Used  Substance and Sexual Activity  . Alcohol use: No    Alcohol/week: 0.0 standard drinks  . Drug use: No  . Sexual activity: Yes  Lifestyle  . Physical activity    Days per week: Not on file    Minutes per session: Not on file  . Stress: Not on file  Relationships  . Social Herbalist on phone: Not on file    Gets together: Not on file    Attends religious service: Not on file    Active member of club or organization:  Not on file    Attends meetings of clubs or organizations: Not on file    Relationship status: Not on file  Other Topics Concern  . Not on file  Social History Narrative   Lives in [place]     Family History: The patient's family history includes Asthma in his mother; Breast cancer in his mother; COPD in his father; Hypertension in his father and mother; Lymphoma in his sister; Prostate cancer in his father.  ROS:   Please see the history of present illness.     All other systems reviewed and are negative.  EKGs/Labs/Other Studies Reviewed:    The following studies were reviewed today: TEE 03-10-2019  EKG:  EKG is ordered today.  The ekg ordered today demonstrates AF with VR 112  Recent Labs: 11/17/2018: B Natriuretic Peptide 35.7 03/03/2019: BUN 25; Creatinine, Ser 1.17; Hemoglobin 10.9; Magnesium 1.9; Platelets 320; Potassium 4.7; Sodium 141  Recent Lipid Panel    Component Value Date/Time   CHOL  02/14/2009 0630    126        ATP III CLASSIFICATION:  <200     mg/dL   Desirable  200-239  mg/dL   Borderline High  >=240    mg/dL   High          TRIG 95 02/14/2009 0630   HDL 48 02/14/2009 0630   CHOLHDL 2.6 02/14/2009 0630   VLDL 19 02/14/2009 0630   LDLCALC  02/14/2009 0630    59        Total Cholesterol/HDL:CHD Risk Coronary Heart Disease Risk Table                     Men   Women  1/2 Average Risk   3.4   3.3  Average Risk       5.0   4.4  2 X Average Risk   9.6   7.1  3 X Average Risk   23.4   11.0        Use the calculated Patient Ratio above and the CHD Risk Table to determine the patient's CHD Risk.        ATP III CLASSIFICATION (LDL):  <100     mg/dL   Optimal  100-129  mg/dL   Near or Above                    Optimal  130-159  mg/dL   Borderline  160-189  mg/dL   High  >190     mg/dL   Very High    Physical Exam:    VS:  BP 132/88   Pulse 80   Temp (!) 97.5 F (36.4 C) (Temporal)   Ht 6\' 5"  (1.956 m)   Wt (!) 308 lb (139.7 kg)   SpO2 97%   BMI 36.52 kg/m     Wt Readings from Last 3 Encounters:  03/31/19 (!) 308 lb (139.7 kg)  03/28/19 290 lb (131.5 kg)  03/21/19 (!) 327 lb (148.3 kg)     GEN: Overweight Caucasian male well developed in no acute distress HEENT: Normal NECK: No JVD; No carotid bruits LYMPHATICS: No lymphadenopathy CARDIAC: irregularly irregular, no murmurs, rubs, gallops RESPIRATORY:  Clear to auscultation without rales, wheezing or rhonchi  ABDOMEN: Soft, non-tender, non-distended MUSCULOSKELETAL:  trace edema; No deformity  SKIN: Warm and dry NEUROLOGIC:  Alert and oriented x 3 PSYCHIATRIC:  Normal affect   ASSESSMENT:    PAF (paroxysmal atrial fibrillation) (HCC) Recurrent PAF-H/O  RFA '07, multiple DCCV.  Last was 03/04/2019 initially successful but in AF again 03/31/2019 and symptomatic  Morbid obesity ([place]) BMI 37- suspected sleep apnea- sleep study ordered. He was turned down for facility study and pt doesn't think home study will be helpful secondary to his restless leg and frequent awakenings.   Essential hypertension Controlled  Anticoagulated Coumadin Rx   PLAN:    I suggested he take Lasix 40 mg daily x 3 days along with K+ 20 Meq for 3 days.  I told him to start taking his PRN Toprol 25 mg on a daily basis.  I'll review with Dr Rayann Heman and get back to the patient.   Medication Adjustments/Labs and Tests Ordered: Current medicines are reviewed at length with the patient today.  Concerns regarding medicines  are outlined above.  Orders Placed This Encounter  Procedures  . EKG 12-Lead   Meds ordered this encounter  Medications  . furosemide (LASIX) 40 MG tablet    Sig: Take 1 tablet a day for 4 days, then only take as needed for swelling.    Dispense:  30 tablet    Refill:  5  . metoprolol succinate (TOPROL XL) 25 MG 24 hr tablet    Sig: Take 1 tablet (25 mg total) by mouth daily. Take an extra as needed for elevated heart rate    Dispense:  180 tablet    Refill:  3  . potassium chloride SA (K-DUR) 20 MEQ tablet    Sig: Take 1 tablet daily for 4 days, then only take on days that you need to take a lasix tablet.    Dispense:  30 tablet    Refill:  5    Patient Instructions  Medication Instructions:  TAKE Lasix 40mg  Take 1 tablet once a day for 3 days then stop TAKE Potassium 38meq once a day for 3 days with Lasix then stop RESTART Metoprolol 25mg  Take 1 tablet once a day  If you need a refill on your cardiac medications before your next appointment, please call your pharmacy.   Lab work: None  If you have labs (blood work) drawn today and your tests are completely normal, you will receive your results only by: Marland Kitchen MyChart Message (if you have MyChart) OR . A paper copy in the mail If you have any lab test that is abnormal or we need to change your treatment, we will call you to review the results.  Testing/Procedures: None   Follow-Up: At [facility], you and your health needs are our priority.  As part of our continuing mission to provide you with exceptional heart care, we have created designated Provider Care Teams.  These Care Teams include your primary Cardiologist (physician) and Advanced Practice Providers (APPs -  Physician Assistants and Nurse Practitioners) who all work together to provide you with the care you need, when you need it. . Once we hear from Dr Rayann Heman we will contact you to schedule your next appointment.   Any Other Special Instructions Will Be Listed  Below (If Applicable).      Signed, Kerin Ransom, PA-C  03/31/2019 2:01 PM    [facility]

## 2019-03-31 NOTE — Assessment & Plan Note (Addendum)
Recurrent PAF-H/O RFA '07, multiple DCCV.  Last was 03/04/2019 initially successful but in AF again 03/31/2019 and symptomatic

## 2019-03-31 NOTE — Patient Instructions (Addendum)
Medication Instructions:  TAKE Lasix 40mg  Take 1 tablet once a day for 3 days then stop TAKE Potassium 87meq once a day for 3 days with Lasix then stop RESTART Metoprolol 25mg  Take 1 tablet once a day  If you need a refill on your cardiac medications before your next appointment, please call your pharmacy.   Lab work: None  If you have labs (blood work) drawn today and your tests are completely normal, you will receive your results only by: Marland Kitchen MyChart Message (if you have MyChart) OR . A paper copy in the mail If you have any lab test that is abnormal or we need to change your treatment, we will call you to review the results.  Testing/Procedures: None   Follow-Up: At [facility], you and your health needs are our priority.  As part of our continuing mission to provide you with exceptional heart care, we have created designated Provider Care Teams.  These Care Teams include your primary Cardiologist (physician) and Advanced Practice Providers (APPs -  Physician Assistants and Nurse Practitioners) who all work together to provide you with the care you need, when you need it. . Once we hear from Dr Rayann Heman we will contact you to schedule your next appointment.   Any Other Special Instructions Will Be Listed Below (If Applicable).

## 2019-03-31 NOTE — Assessment & Plan Note (Signed)
BMI 37- suspected sleep apnea- sleep study ordered. He was turned down for facility study and pt doesn't think home study will be helpful secondary to his restless leg and frequent awakenings.

## 2019-04-01 NOTE — Telephone Encounter (Signed)
Called and left 2nd VM for pt to call A-Fib to schedule appts for November.

## 2019-04-04 ENCOUNTER — Telehealth (HOSPITAL_COMMUNITY): Payer: Self-pay | Admitting: Professional

## 2019-04-06 ENCOUNTER — Other Ambulatory Visit: Payer: 59

## 2019-04-06 NOTE — Telephone Encounter (Signed)
Patient r/s for 04/13/19.

## 2019-04-07 ENCOUNTER — Telehealth: Payer: Self-pay

## 2019-04-07 NOTE — Telephone Encounter (Signed)
lmom for overdue inr 

## 2019-04-12 ENCOUNTER — Other Ambulatory Visit: Payer: 59

## 2019-04-13 ENCOUNTER — Other Ambulatory Visit: Payer: 59

## 2019-04-15 MED ORDER — LISINOPRIL 40 MG PO TABS: 40.0000 mg | ORAL_TABLET | Freq: Every day | ORAL | 6 refills | Status: DC

## 2019-04-18 ENCOUNTER — Telehealth: Payer: Self-pay | Admitting: *Deleted

## 2019-04-18 ENCOUNTER — Telehealth: Payer: Self-pay | Admitting: Neurology

## 2019-04-18 DIAGNOSIS — I480 Paroxysmal atrial fibrillation: Secondary | ICD-10-CM

## 2019-04-18 DIAGNOSIS — I10 Essential (primary) hypertension: Secondary | ICD-10-CM

## 2019-04-18 MED ORDER — FUROSEMIDE 40 MG PO TABS: 40.0000 mg | ORAL_TABLET | Freq: Two times a day (BID) | ORAL | 5 refills | Status: DC

## 2019-04-18 MED ORDER — METOPROLOL SUCCINATE ER 25 MG PO TB24: 50.0000 mg | ORAL_TABLET | Freq: Every day | ORAL | 3 refills | Status: DC

## 2019-04-18 NOTE — Telephone Encounter (Signed)
Change lasix to 40 mg BID; bmet one week; take cardizem as prescribed; increase toprol to 50 mg daily; schedule visit in atrial fibrillation clinic; ? tikosyn or amiodarone. Repeat echo to reassess MR. Will review with Dr Rayann Heman Kirk Ruths

## 2019-04-18 NOTE — Telephone Encounter (Signed)
Pt called in and stated he needs to cancel his MRI , he has some heart issues going on

## 2019-04-18 NOTE — Telephone Encounter (Signed)
Call placed to the patient concerning his MyChart message:  Urgent Message:  please advise. Blood pressure is 160/122 pulse 115. These numbers are consistent. Can't breathe can't sleep. Please advise. Taking Metoproolol 25 mg but just not reducing my pulse enough to sleep or move around  He stated that for the last several days his heart rate has been from 105-115. He has not slept in 36 hours because when he lies down he cannot breathe. He feels like this is all due to the atrial fibrillation. He stated that his symptoms are headache and exhaustion (due to lack of sleep)  He is currently taking Lisinopril 40 mg once daily, Cardizem 360 mg once daily and Metoprolol 25 mg once daily.   He has not had his Cardizem since Saturday because he ran out. He took 50 mg of Metoprolol this morning. His blood pressure and heart rate while on the phone was 154/103 and heart rate was 107.  He has been advised to get his Cardizem from the pharmacy and take that. He has also been advised to not take his blood pressure so many times because this can also make the blood pressure increase. He had taken it 7 times today. Message routed to the provider for further recommendations.

## 2019-04-18 NOTE — Telephone Encounter (Signed)
The patient has been called and verbalized his understanding.  -Furosemide has been increased to 40 mg bid -Metoprolol Succinate has been increased to 50 mg once daily -A BMET has been ordered. He will have this done at [facility] st on 10/19 when he has his ECHO. -He has an appointment with the AFIB clinic on 10/26.  It has been reiterated again that he needs to pick up his Diltiazem from the pharmacy and restart it.  The patient has also requested that the home sleep study be cancelled. He does not feel like this would be useful at this time. He stated that this was discussed at his last office visit.

## 2019-04-19 ENCOUNTER — Other Ambulatory Visit: Payer: 59

## 2019-04-19 NOTE — Telephone Encounter (Signed)
Spoke with patient this morning.  He feels better after taking his Cardizem and metoprolol as well as Lasix.  He is scheduled for echocardiogram next week.  Please add him to my schedule somewhere the following week. Kirk Ruths

## 2019-04-19 NOTE — Telephone Encounter (Signed)
Noted, I canceled his MRI.

## 2019-04-21 ENCOUNTER — Ambulatory Visit (INDEPENDENT_AMBULATORY_CARE_PROVIDER_SITE_OTHER): Payer: 59 | Admitting: Pharmacist Clinician (PhC)/ Clinical Pharmacy Specialist

## 2019-04-21 ENCOUNTER — Other Ambulatory Visit: Payer: Self-pay

## 2019-04-21 DIAGNOSIS — I4891 Unspecified atrial fibrillation: Secondary | ICD-10-CM | POA: Diagnosis not present

## 2019-04-21 DIAGNOSIS — Z7901 Long term (current) use of anticoagulants: Secondary | ICD-10-CM

## 2019-04-21 DIAGNOSIS — I480 Paroxysmal atrial fibrillation: Secondary | ICD-10-CM

## 2019-04-21 LAB — POCT INR: INR: 2.1 (ref 2.0–3.0)

## 2019-04-21 NOTE — Telephone Encounter (Signed)
This encounter was created in error - please disregard.

## 2019-04-22 ENCOUNTER — Encounter (HOSPITAL_BASED_OUTPATIENT_CLINIC_OR_DEPARTMENT_OTHER): Payer: 59 | Admitting: Cardiovascular Disease

## 2019-04-25 ENCOUNTER — Other Ambulatory Visit: Payer: Self-pay

## 2019-04-25 ENCOUNTER — Ambulatory Visit (HOSPITAL_COMMUNITY)
Admission: RE | Admit: 2019-04-25 | Discharge: 2019-04-25 | Disposition: A | Discharge status: Home / Self Care | Payer: 59 | Source: Ambulatory Visit | Attending: Internal Medicine | Admitting: Internal Medicine

## 2019-04-25 DIAGNOSIS — I4819 Other persistent atrial fibrillation: Secondary | ICD-10-CM

## 2019-04-25 NOTE — Progress Notes (Signed)
  Echocardiogram 2D Echocardiogram has been performed.  Kelin Nixon [facility] 04/25/2019, 12:15 PM

## 2019-04-29 ENCOUNTER — Ambulatory Visit (INDEPENDENT_AMBULATORY_CARE_PROVIDER_SITE_OTHER): Payer: 59 | Admitting: Pharmacist Clinician (PhC)/ Clinical Pharmacy Specialist

## 2019-04-29 ENCOUNTER — Other Ambulatory Visit: Payer: Self-pay

## 2019-04-29 DIAGNOSIS — I4891 Unspecified atrial fibrillation: Secondary | ICD-10-CM

## 2019-04-29 DIAGNOSIS — Z7901 Long term (current) use of anticoagulants: Secondary | ICD-10-CM | POA: Diagnosis not present

## 2019-04-29 DIAGNOSIS — I480 Paroxysmal atrial fibrillation: Secondary | ICD-10-CM

## 2019-04-29 LAB — POCT INR: INR: 5.1 — AB (ref 2.0–3.0)

## 2019-05-02 ENCOUNTER — Encounter (HOSPITAL_COMMUNITY): Payer: Self-pay | Admitting: Physician Assistant

## 2019-05-02 ENCOUNTER — Other Ambulatory Visit: Payer: Self-pay

## 2019-05-02 ENCOUNTER — Ambulatory Visit (HOSPITAL_COMMUNITY)
Admission: RE | Admit: 2019-05-02 | Discharge: 2019-05-02 | Disposition: A | Discharge status: Home / Self Care | Payer: 59 | Source: Ambulatory Visit | Attending: Physician Assistant | Admitting: Physician Assistant

## 2019-05-02 VITALS — BP 138/90 | HR 87 | Ht 77.0 in | Wt 318.4 lb

## 2019-05-02 DIAGNOSIS — E785 Hyperlipidemia, unspecified: Secondary | ICD-10-CM | POA: Diagnosis not present

## 2019-05-02 DIAGNOSIS — K219 Gastro-esophageal reflux disease without esophagitis: Secondary | ICD-10-CM | POA: Diagnosis not present

## 2019-05-02 DIAGNOSIS — Z79899 Other long term (current) drug therapy: Secondary | ICD-10-CM | POA: Diagnosis not present

## 2019-05-02 DIAGNOSIS — Z7901 Long term (current) use of anticoagulants: Secondary | ICD-10-CM | POA: Insufficient documentation

## 2019-05-02 DIAGNOSIS — I4819 Other persistent atrial fibrillation: Secondary | ICD-10-CM

## 2019-05-02 DIAGNOSIS — I110 Hypertensive heart disease with heart failure: Secondary | ICD-10-CM | POA: Diagnosis not present

## 2019-05-02 DIAGNOSIS — M5126 Other intervertebral disc displacement, lumbar region: Secondary | ICD-10-CM | POA: Insufficient documentation

## 2019-05-02 DIAGNOSIS — F329 Major depressive disorder, single episode, unspecified: Secondary | ICD-10-CM | POA: Diagnosis not present

## 2019-05-02 DIAGNOSIS — I340 Nonrheumatic mitral (valve) insufficiency: Secondary | ICD-10-CM | POA: Insufficient documentation

## 2019-05-02 DIAGNOSIS — R0683 Snoring: Secondary | ICD-10-CM | POA: Diagnosis not present

## 2019-05-02 DIAGNOSIS — F419 Anxiety disorder, unspecified: Secondary | ICD-10-CM | POA: Diagnosis not present

## 2019-05-02 DIAGNOSIS — K589 Irritable bowel syndrome without diarrhea: Secondary | ICD-10-CM | POA: Diagnosis not present

## 2019-05-02 DIAGNOSIS — D6869 Other thrombophilia: Secondary | ICD-10-CM | POA: Diagnosis not present

## 2019-05-02 DIAGNOSIS — I5032 Chronic diastolic (congestive) heart failure: Secondary | ICD-10-CM | POA: Diagnosis not present

## 2019-05-02 LAB — COMPREHENSIVE METABOLIC PANEL
ALT: 50 U/L — ABNORMAL HIGH (ref 0–44)
AST: 45 U/L — ABNORMAL HIGH (ref 15–41)
Albumin: 3.6 g/dL (ref 3.5–5.0)
Alkaline Phosphatase: 93 U/L (ref 38–126)
Anion gap: 14 (ref 5–15)
BUN: 21 mg/dL (ref 8–23)
CO2: 19 mmol/L — ABNORMAL LOW (ref 22–32)
Calcium: 8.9 mg/dL (ref 8.9–10.3)
Chloride: 104 mmol/L (ref 98–111)
Creatinine, Ser: 1.13 mg/dL (ref 0.61–1.24)
GFR calc Af Amer: >60 mL/min (ref >60)
GFR calc non Af Amer: >60 mL/min (ref >60)
Glucose, Bld: 100 mg/dL — ABNORMAL HIGH (ref 70–99)
Potassium: 4.2 mmol/L (ref 3.5–5.1)
Sodium: 137 mmol/L (ref 135–145)
Total Bilirubin: 0.5 mg/dL (ref 0.3–1.2)
Total Protein: 7.2 g/dL (ref 6.5–8.1)

## 2019-05-02 LAB — TSH: TSH: 1.256 u[IU]/mL (ref 0.350–4.500)

## 2019-05-02 MED ORDER — AMIODARONE HCL 200 MG PO TABS: ORAL_TABLET | ORAL | 0 refills | Status: DC

## 2019-05-02 NOTE — Patient Instructions (Signed)
Start Amiodarone 200mg  twice a day with food for one month then reduce to one tablet daily  Continue weekly INRs  Inform who follows INRs that you are starting Amiodarone today

## 2019-05-02 NOTE — Progress Notes (Signed)
Primary Care Physician: [name], MD Primary Cardiologist: Dr Stanford Breed Primary Electrophysiologist: Dr Rayann Heman Referring Physician: Dr Lucilla Edin [name] is a 63 y.o. male with a history of persistent atrial fibrillation (s/p ablation 2007 at [facility]), HTN, HLD, tachycardia mediated CM,  who presents for follow up in the [facility].  The patient was initially diagnosed with atrial fibrillation several years ago and is s/p afib ablation at [facility] 2007. He has done well since then requiring two cardioversions in the interim. He was in his usual state of health until 02/2019 when he noted increased fatigue and dyspnea with exertion. This was typical of his afib symptoms in the past. There were no triggers that the patient could identify. He denies any alcohol or caffeine use. He does admit to snoring and witnessed apnea episodes by his sleep partner. He is on warfarin for a CHADS2VASC score of 2.  On follow up today, patient is s/p TEE/DCCV on 03/04/19. Initially he felt well post DCCV with resolution of his CHF symptoms. Unfortunately, he was back in AF at his cardiology visit on 03/31/19. Overall, patient reports that he feels OK but he does note more dyspnea with exertion when he is in afib. He remains very active doing housework and exercising daily. He checks his BP and HR twice daily and it has shown excellent control.   Today, he denies symptoms of palpitations, chest pain, PND, dizziness, presyncope, syncope, snoring, daytime somnolence, bleeding, or neurologic sequela. The patient is tolerating medications without difficulties and is otherwise without complaint today.    Atrial Fibrillation Risk Factors:  he does have symptoms of sleep apnea. he does not have a history of rheumatic fever. he does have a history of alcohol use. The patient does not have a history of early familial atrial fibrillation or other arrhythmias.  he has a BMI of Body mass  index is 37.76 kg/m.Marland Kitchen Filed Weights   05/02/19 1100  Weight: (!) 144.4 kg    Family History  Problem Relation Age of Onset  . Asthma Mother   . Breast cancer Mother   . Hypertension Mother   . COPD Father   . Prostate cancer Father   . Hypertension Father   . Lymphoma Sister      Atrial Fibrillation Management history:  Previous antiarrhythmic drugs: flecainide, sotalol, Tikosyn Previous cardioversions: 2013, 2018, 03/04/19 Previous ablations: 2007 [facility] CHADS2VASC score: 2 Anticoagulation history: Warfarin    Past Medical History:  Diagnosis Date  . Allergic rhinitis   . Anxiety   . Asthma    as a child  . Chronic low back pain   . Complication of anesthesia    "hard to put asleep, hard to wake up, nausea and vomiting"  . Depression   . ED (erectile dysfunction)   . GERD (gastroesophageal reflux disease)   . HLD (hyperlipidemia)   . HTN (hypertension)    sees Dr. [name], [facility]  . Hypertrophy of prostate with urinary obstruction and other lower urinary tract symptoms (LUTS)   . IBS (irritable bowel syndrome)   . Inguinal hernia without mention of obstruction or gangrene, unilateral or unspecified, (not specified as recurrent)   . Insomnia   . Lumbar herniated disc    L7  . Morbid obesity ([place])   . Narcotic addiction ([place])   . NICM (nonischemic cardiomyopathy) (HCC)    tachycardia mediated,  resolved with sinus  . Persistent atrial fibrillation ([place])  ablation done 03/2006 at [facility]  . Pneumonia    hx of 2004  . PONV (postoperative nausea and vomiting)   . Twitching    legs   Past Surgical History:  Procedure Laterality Date  . ATRIAL ABLATION SURGERY  2007   [facility]  . CARDIOVERSION  08/11/2011   Procedure: CARDIOVERSION;  Surgeon: Lelon Perla, MD;  Location: [place];  Service: Cardiovascular;  Laterality: N/A;  . CARDIOVERSION N/A 04/16/2017   Procedure: CARDIOVERSION;  Surgeon: Sanda Klein, MD;  Location: [facility] ENDOSCOPY;  Service:  Cardiovascular;  Laterality: N/A;  . CARDIOVERSION N/A 03/04/2019   Procedure: CARDIOVERSION;  Surgeon: Lelon Perla, MD;  Location: [facility] ENDOSCOPY;  Service: Cardiovascular;  Laterality: N/A;  . EYE SURGERY     lasik, 1998  . HERNIA REPAIR     1977  . INGUINAL HERNIA REPAIR Right 08/26/2012   Procedure: LAPAROSCOPIC INGUINAL HERNIA;  Surgeon: Madilyn Hook, DO;  Location: [place];  Service: General;  Laterality: Right;  laparoscopic right inguinal hernia repair with mesh, umbilical hernia repair  . INSERTION OF MESH Right 08/26/2012   Procedure: INSERTION OF MESH;  Surgeon: Madilyn Hook, DO;  Location: [place];  Service: General;  Laterality: Right;  right inguinal hernia  . TEE WITHOUT CARDIOVERSION N/A 03/04/2019   Procedure: TRANSESOPHAGEAL ECHOCARDIOGRAM (TEE);  Surgeon: Lelon Perla, MD;  Location: [place];  Service: Cardiovascular;  Laterality: N/A;  . UMBILICAL HERNIA REPAIR N/A 08/26/2012   Procedure: HERNIA REPAIR UMBILICAL ADULT;  Surgeon: Madilyn Hook, DO;  Location: [place];  Service: General;  Laterality: N/A;    Current Outpatient Medications  Medication Sig Dispense Refill  . acetaminophen (TYLENOL) 325 MG tablet Take 650 mg by mouth every 6 (six) hours as needed. For pain    . diltiazem (CARDIZEM CD) 360 MG 24 hr capsule Take 360 mg by mouth daily.    . DULoxetine (CYMBALTA) 60 MG capsule Take 120 mg by mouth daily.     . furosemide (LASIX) 40 MG tablet Take 1 tablet (40 mg total) by mouth 2 (two) times daily. 60 tablet 5  . gabapentin (NEURONTIN) 300 MG capsule TK 4 CS PO HS FOR TREATMENT OF CHRONIC LOW BACK PAIN    . lisinopril (ZESTRIL) 40 MG tablet Take 1 tablet (40 mg total) by mouth daily. 30 tablet 6  . metoprolol succinate (TOPROL XL) 25 MG 24 hr tablet Take 2 tablets (50 mg total) by mouth daily. 180 tablet 3  . montelukast (SINGULAIR) 10 MG tablet Take 10 mg by mouth at bedtime as needed (allergies).     Marland Kitchen omeprazole (PRILOSEC) 20 MG capsule Take 20 mg by mouth  daily as needed (acid reflux/indigestion.).     Marland Kitchen potassium chloride SA (K-DUR) 20 MEQ tablet Take 1 tablet daily for 4 days, then only take on days that you need to take a lasix tablet. 30 tablet 5  . rOPINIRole (REQUIP) 1 MG tablet Take 1-2 mg by mouth at bedtime as needed (restless leg syndrome). 1-3 hours prior to bedtime     . sildenafil (REVATIO) 20 MG tablet Take 40-100 mg by mouth daily as needed.    . warfarin (COUMADIN) 5 MG tablet Take 1 tablet (5 mg total) by mouth daily. 90 tablet 0  . amiodarone (PACERONE) 200 MG tablet Take 1 tablet twice a day for one month then reduce to one tablet daily 60 tablet 0   No current facility-administered medications for this encounter.     Allergies  Allergen  Reactions  . Hydrocodone Other (See Comments)    GI upset, dizziness    Social History   Socioeconomic History  . Marital status: Married    Spouse name: Not on file  . Number of children: 3  . Years of education: Not on file  . Highest education level: Not on file  Occupational History  . 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 Financial resource strain: Not on file  . Food insecurity    Worry: Not on file    Inability: Not on file  . Transportation needs    Medical: Not on file    Non-medical: Not on file  Tobacco Use  . Smoking status: Never Smoker  . Smokeless tobacco: Never Used  Substance and Sexual Activity  . Alcohol use: No    Alcohol/week: 0.0 standard drinks  . Drug use: No  . Sexual activity: Yes  Lifestyle  . Physical activity    Days per week: Not on file    Minutes per session: Not on file  . Stress: Not on file  Relationships  . Social Herbalist on phone: Not on file    Gets together: Not on file    Attends religious service: Not on file    Active member of club or organization: Not on file    Attends meetings of clubs or organizations: Not on file    Relationship status: Not on file  . Intimate partner violence     Fear of current or ex partner: Not on file    Emotionally abused: Not on file    Physically abused: Not on file    Forced sexual activity: Not on file  Other Topics Concern  . Not on file  Social History Narrative   Lives in [place] are reviewed and negative except as per the HPI above.  Physical Exam: Vitals:   05/02/19 1100  BP: 138/90  Pulse: 87  Weight: (!) 144.4 kg  Height: 6\' 5"  (1.956 m)    GEN- The patient is well appearing obese male, alert and oriented x 3 today.   HEENT-head normocephalic, atraumatic, sclera clear, conjunctiva pink, hearing intact, trachea midline. Lungs- Clear to ausculation bilaterally, normal work of breathing Heart- irregular rate and rhythm, no murmurs, rubs or gallops  GI- soft, NT, ND, + BS Extremities- no clubbing, cyanosis. Trace bilateral edema MS- no significant deformity or atrophy Skin- no rash or lesion Psych- euthymic mood, full affect Neuro- strength and sensation are intact   Wt Readings from Last 3 Encounters:  05/02/19 (!) 144.4 kg  03/31/19 (!) 139.7 kg  03/28/19 131.5 kg    EKG today demonstrates afib HR 87, QRS 104, QTc 486  Echo 04/25/19 demonstrated  1. Left ventricular ejection fraction, by visual estimation, is 55 to 60%. The left ventricle has normal function. Normal left ventricular size. There is mildly increased left ventricular hypertrophy.  2. Global right ventricle has normal systolic function.The right ventricular size is normal. No increase in right ventricular wall thickness.  3. Left atrial size was moderately dilated.  4. Right atrial size was normal.  5. The mitral valve is normal in structure. Trace mitral valve regurgitation. No evidence of mitral stenosis.  6. The tricuspid valve is normal in structure. Tricuspid valve regurgitation was not visualized by color flow Doppler.  7. The aortic valve is normal in structure. Aortic valve regurgitation was not visualized by color  flow Doppler. Structurally  normal aortic valve, with no evidence of sclerosis or stenosis.  8. The pulmonic valve was normal in structure. Pulmonic valve regurgitation is not visualized by color flow Doppler.  9. The inferior vena cava is normal in size with greater than 50% respiratory variability, suggesting right atrial pressure of 3 mmHg.  LA 5.6 cm  Epic records are reviewed at length today  Assessment and Plan:  1. Persistent atrial fibrillation S/p DCCV 03/04/19 with ERAF. Recall he has failed flecainide, sotalol, and Tikosyn in the past. Not a good candidate for repeat ablation given LA size.  After discussing the risks and benefits, will start amiodarone 200 mg BID for 4 weeks then decrease to 200 mg daily. Will continue weekly INR checks in anticipation of repeat DCCV after loading on the medication. Continue diltiazem 360 mg daily Continue Toprol 50 mg daily. May need to decrease/stop BB once loaded on amiodarone. Continue warfarin for a CHADS2VASC score of 2. Lifestyle changes as below. Cmet/TSH today.  This patients CHA2DS2-VASc Score and unadjusted Ischemic Stroke Rate (% per year) is equal to 2.2 % stroke rate/year from a score of 2  Above score calculated as 1 point each if present [CHF, HTN, DM, Vascular=MI/PAD/Aortic Plaque, Age if 65-74, or Male] Above score calculated as 2 points each if present [Age > 75, or Stroke/TIA/TE]   2. Obesity Body mass index is 37.76 kg/m. Lifestyle modification was discussed and encouraged including regular physical activity and weight reduction. Patient doing well exercising regularly.   3. Snoring/witnessed apnea The importance of adequate treatment of sleep apnea was discussed today in order to improve our ability to maintain sinus rhythm long term. Patient's insurance denied in-lab sleep study. Patient in the process of appealing this.  4. HTN Stable, no changes today.  5. Diastolic CHF Patient reports that by his home  scale, he has actually lost weight. No signs of fluid overload today. Check renal function as above. Hopefully this will improve with SR.  6. Mitral regurgitation  Trace MR and no stenosis on recent echo.    Follow up in the AF clinic in 3 weeks.   [facility] [address] [place], [place] [address] [phone] 05/02/2019 11:40 AM

## 2019-05-06 ENCOUNTER — Other Ambulatory Visit: Payer: Self-pay

## 2019-05-06 ENCOUNTER — Ambulatory Visit (INDEPENDENT_AMBULATORY_CARE_PROVIDER_SITE_OTHER): Payer: 59 | Admitting: Pharmacist Clinician (PhC)/ Clinical Pharmacy Specialist

## 2019-05-06 DIAGNOSIS — Z7901 Long term (current) use of anticoagulants: Secondary | ICD-10-CM | POA: Diagnosis not present

## 2019-05-06 DIAGNOSIS — I4891 Unspecified atrial fibrillation: Secondary | ICD-10-CM

## 2019-05-06 DIAGNOSIS — I480 Paroxysmal atrial fibrillation: Secondary | ICD-10-CM

## 2019-05-06 LAB — POCT INR: INR: 2.9 (ref 2.0–3.0)

## 2019-05-09 ENCOUNTER — Encounter: Payer: Self-pay | Admitting: Cardiology

## 2019-05-09 NOTE — Telephone Encounter (Signed)
This encounter was created in error - please disregard.

## 2019-05-09 NOTE — Telephone Encounter (Signed)
New Message ° °Patient is returning call. Please give patient a call back.  °

## 2019-05-11 ENCOUNTER — Ambulatory Visit (INDEPENDENT_AMBULATORY_CARE_PROVIDER_SITE_OTHER): Payer: 59 | Admitting: Cardiology

## 2019-05-11 ENCOUNTER — Encounter: Payer: Self-pay | Admitting: Cardiology

## 2019-05-11 ENCOUNTER — Other Ambulatory Visit: Payer: Self-pay

## 2019-05-11 VITALS — BP 150/70 | HR 85 | Ht 77.0 in | Wt 319.0 lb

## 2019-05-11 DIAGNOSIS — I420 Dilated cardiomyopathy: Secondary | ICD-10-CM | POA: Diagnosis not present

## 2019-05-11 DIAGNOSIS — I10 Essential (primary) hypertension: Secondary | ICD-10-CM | POA: Diagnosis not present

## 2019-05-11 DIAGNOSIS — I4819 Other persistent atrial fibrillation: Secondary | ICD-10-CM

## 2019-05-11 NOTE — Patient Instructions (Signed)
Medication Instructions:  INCREASE AMIODARONE TO 400 MG TWICE DAILY X 1 WEEKS THEN TAKE 200 MG TWICE DAILY X 1 WEEK THEN TAKE 200 MG ONCE DAILY  *If you need a refill on your cardiac medications before your next appointment, please call your pharmacy*  Lab Work: If you have labs (blood work) drawn today and your tests are completely normal, you will receive your results only by: Marland Kitchen MyChart Message (if you have MyChart) OR . A paper copy in the mail If you have any lab test that is abnormal or we need to change your treatment, we will call you to review the results.  Testing/Procedures: Your physician has recommended that you have a Cardioversion (DCCV). Electrical Cardioversion uses a jolt of electricity to your heart either through paddles or wired patches attached to your chest. This is a controlled, usually prescheduled, procedure. Defibrillation is done under light anesthesia in the hospital, and you usually go home the day of the procedure. This is done to get your heart back into a normal rhythm. You are not awake for the procedure. Please see the instruction sheet given to you today.  4 WEEKS IF INR THERAPUTIC  Follow-Up: At [facility], you and your health needs are our priority.  As part of our continuing mission to provide you with exceptional heart care, we have created designated Provider Care Teams.  These Care Teams include your primary Cardiologist (physician) and Advanced Practice Providers (APPs -  Physician Assistants and Nurse Practitioners) who all work together to provide you with the care you need, when you need it.  Your physician recommends that you schedule a follow-up appointment in: [place]

## 2019-05-11 NOTE — Progress Notes (Signed)
HPI: FU paroxysmal atrial fibrillation. The patient had a cardiac catheterization in 2004 that showed an ejection fraction of 45% and normal coronary arteries. He did have atrial fibrillation at that time and his LV function improved after sinus rhythm was restored. He ultimately had atrial fibrillation ablation. He did well for several years but then recurrent atrial fibrillation. Transesophageal echocardiogram August 2020 showed normal LV function, severe left atrial enlargement, moderate to severe mitral regurgitation and mild tricuspid regurgitation. Patient had cardioversion March 04, 2019 but atrial fibrillation recurred.  Patient seen by Dr. Rayann Heman and repeat ablation felt not indicated as chances of maintaining sinus would be lower given obesity and severe left atrial enlargement.  Plan was to potentially consider referral for surgical [place] procedure and mitral valve repair.  Follow-up transthoracic echocardiogram October 2020 showed normal LV function, moderate left atrial enlargement and trace mitral regurgitation.  Patient subsequently placed on amiodarone in the atrial fibrillation clinic.  Also referred for evaluation of sleep apnea.  Since last seen,he has some dyspnea on exertion and fatigue.  Mild pedal edema.  No chest pain, palpitations, syncope or bleeding.  Current Outpatient Medications  Medication Sig Dispense Refill  . acetaminophen (TYLENOL) 325 MG tablet Take 650 mg by mouth every 6 (six) hours as needed. For pain    . amiodarone (PACERONE) 200 MG tablet Take 1 tablet twice a day for one month then reduce to one tablet daily 60 tablet 0  . diltiazem (CARDIZEM CD) 360 MG 24 hr capsule Take 360 mg by mouth daily.    . DULoxetine (CYMBALTA) 60 MG capsule Take 120 mg by mouth daily.     . furosemide (LASIX) 40 MG tablet Take 1 tablet (40 mg total) by mouth 2 (two) times daily. 60 tablet 5  . gabapentin (NEURONTIN) 300 MG capsule TK 4 CS PO HS FOR TREATMENT OF CHRONIC LOW  BACK PAIN    . lisinopril (ZESTRIL) 40 MG tablet Take 1 tablet (40 mg total) by mouth daily. 30 tablet 6  . metoprolol succinate (TOPROL XL) 25 MG 24 hr tablet Take 2 tablets (50 mg total) by mouth daily. 180 tablet 3  . montelukast (SINGULAIR) 10 MG tablet Take 10 mg by mouth at bedtime as needed (allergies).     Marland Kitchen omeprazole (PRILOSEC) 20 MG capsule Take 20 mg by mouth daily as needed (acid reflux/indigestion.).     Marland Kitchen potassium chloride SA (K-DUR) 20 MEQ tablet Take 1 tablet daily for 4 days, then only take on days that you need to take a lasix tablet. 30 tablet 5  . rOPINIRole (REQUIP) 1 MG tablet Take 1-2 mg by mouth at bedtime as needed (restless leg syndrome). 1-3 hours prior to bedtime     . sildenafil (REVATIO) 20 MG tablet Take 40-100 mg by mouth daily as needed.    . warfarin (COUMADIN) 5 MG tablet Take 1 tablet (5 mg total) by mouth daily. 90 tablet 0   No current facility-administered medications for this visit.      Past Medical History:  Diagnosis Date  . Allergic rhinitis   . Anxiety   . Asthma    as a child  . Chronic low back pain   . Complication of anesthesia    "hard to put asleep, hard to wake up, nausea and vomiting"  . Depression   . ED (erectile dysfunction)   . GERD (gastroesophageal reflux disease)   . HLD (hyperlipidemia)   . HTN (hypertension)  sees Dr. [name], [facility]  . Hypertrophy of prostate with urinary obstruction and other lower urinary tract symptoms (LUTS)   . IBS (irritable bowel syndrome)   . Inguinal hernia without mention of obstruction or gangrene, unilateral or unspecified, (not specified as recurrent)   . Insomnia   . Lumbar herniated disc    L7  . Morbid obesity ([place])   . Narcotic addiction ([place])   . NICM (nonischemic cardiomyopathy) ([facility])    tachycardia mediated,  resolved with sinus  . Persistent atrial fibrillation ([place])    ablation done 03/2006 at [facility]  . Pneumonia    hx of 2004  . PONV (postoperative nausea  and vomiting)   . Twitching    legs    Past Surgical History:  Procedure Laterality Date  . ATRIAL ABLATION SURGERY  2007   [facility]  . CARDIOVERSION  08/11/2011   Procedure: CARDIOVERSION;  Surgeon: Lelon Perla, MD;  Location: [place];  Service: Cardiovascular;  Laterality: N/A;  . CARDIOVERSION N/A 04/16/2017   Procedure: CARDIOVERSION;  Surgeon: Sanda Klein, MD;  Location: [facility];  Service: Cardiovascular;  Laterality: N/A;  . CARDIOVERSION N/A 03/04/2019   Procedure: CARDIOVERSION;  Surgeon: Lelon Perla, MD;  Location: [facility];  Service: Cardiovascular;  Laterality: N/A;  . EYE SURGERY     [place], 1998  . HERNIA REPAIR     1977  . INGUINAL HERNIA REPAIR Right 08/26/2012   Procedure: LAPAROSCOPIC INGUINAL HERNIA;  Surgeon: Madilyn Hook, DO;  Location: [place];  Service: General;  Laterality: Right;  laparoscopic right inguinal hernia repair with mesh, umbilical hernia repair  . INSERTION OF MESH Right 08/26/2012   Procedure: INSERTION OF MESH;  Surgeon: Madilyn Hook, DO;  Location: [place];  Service: General;  Laterality: Right;  right inguinal hernia  . TEE WITHOUT CARDIOVERSION N/A 03/04/2019   Procedure: TRANSESOPHAGEAL ECHOCARDIOGRAM (TEE);  Surgeon: Lelon Perla, MD;  Location: [place] [facility];  Service: Cardiovascular;  Laterality: N/A;  . UMBILICAL HERNIA REPAIR N/A 08/26/2012   Procedure: HERNIA REPAIR UMBILICAL ADULT;  Surgeon: Madilyn Hook, DO;  Location: [place];  Service: General;  Laterality: N/A;    Social History   Socioeconomic History  . Marital status: Married    Spouse name: Not on file  . Number of children: 3  . Years of education: Not on file  . Highest education level: Not on file  Occupational History  . 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 Financial resource strain: Not on file  . Food insecurity    Worry: Not on file    Inability: Not on file  . Transportation needs    Medical: Not on file    Non-medical: Not on file   Tobacco Use  . Smoking status: Never Smoker  . Smokeless tobacco: Never Used  Substance and Sexual Activity  . Alcohol use: No    Alcohol/week: 0.0 standard drinks  . Drug use: No  . Sexual activity: Yes  Lifestyle  . Physical activity    Days per week: Not on file    Minutes per session: Not on file  . Stress: Not on file  Relationships  . Social Herbalist on phone: Not on file    Gets together: Not on file    Attends religious service: Not on file    Active member of club or organization: Not on file    Attends meetings of clubs or organizations: Not on file  Relationship status: Not on file  . Intimate partner violence    Fear of current or ex partner: Not on file    Emotionally abused: Not on file    Physically abused: Not on file    Forced sexual activity: Not on file  Other Topics Concern  . Not on file  Social History Narrative   Lives in [place]    Family History  Problem Relation Age of Onset  . Asthma Mother   . Breast cancer Mother   . Hypertension Mother   . COPD Father   . Prostate cancer Father   . Hypertension Father   . Lymphoma Sister     ROS: Fatigue but no fevers or chills, productive cough, hemoptysis, dysphasia, odynophagia, melena, hematochezia, dysuria, hematuria, rash, seizure activity, orthopnea, PND,  claudication. Remaining systems are negative.  Physical Exam: Well-developed obese in no acute distress.  Skin is warm and dry.  HEENT is normal.  Neck is supple.  Chest is clear to auscultation with normal expansion.  Cardiovascular exam is irregular  Abdominal exam nontender or distended. No masses palpated. Extremities show trace edema. neuro grossly intact  ECG-atrial fibrillation at a rate of 85, no ST changes.  Personally reviewed  A/P  1 persistent atrial fibrillation-patient remains in atrial fibrillation today.  He is symptomatic with dyspnea on exertion and fatigue.  We will continue Cardizem and metoprolol  at present dose.  Continue Coumadin with goal INR 2-3.  He failed Tikosyn in the past.  He was recently started on amiodarone.  I will change to 400 mg twice daily for 1 week then 200 mg twice daily for 1 week then 200 mg daily thereafter.  In 4 to 5 weeks once amiodarone is in his system we will plan to proceed with cardioversion.  Hopefully amiodarone will help hold sinus rhythm.  If not may need to consider referral for surgical ablation.  He was seen by Dr. Rayann Heman and felt not to be a good candidate for repeat catheter ablation.  2 Mitral regurgitation-3+ on previous transesophageal echocardiogram.  However trace on most recent follow-up transthoracic.  We will need follow-up evaluation in the future.  3 history of cardiomyopathy-previously this was felt to be tachycardia mediated.  LV function has normalized.  4 hypertension-blood pressure is elevated.  However he is not taking his Lasix twice daily at times.  He will continue with that dose and we will adjust regimen as needed.    5 anemia-I previously asked patient to follow-up with primary care for this issue.  6 obesity/obstructive sleep apnea-we will try and arrange a sleep study which apparently was denied by [place] [facility].  I will also have him evaluated by Dr. Claiborne Billings.  7 chronic diastolic congestive heart failure-mildly volume overloaded.  He will continue Lasix 40 mg twice daily.  Kirk Ruths, MD

## 2019-05-16 ENCOUNTER — Other Ambulatory Visit: Payer: Self-pay

## 2019-05-16 MED ORDER — WARFARIN SODIUM 5 MG PO TABS: 5.0000 mg | ORAL_TABLET | Freq: Every day | ORAL | 0 refills | Status: DC

## 2019-05-17 ENCOUNTER — Telehealth: Payer: Self-pay | Admitting: Neurology

## 2019-05-17 ENCOUNTER — Other Ambulatory Visit (INDEPENDENT_AMBULATORY_CARE_PROVIDER_SITE_OTHER): Payer: 59

## 2019-05-17 DIAGNOSIS — I4891 Unspecified atrial fibrillation: Secondary | ICD-10-CM | POA: Diagnosis not present

## 2019-05-17 DIAGNOSIS — I480 Paroxysmal atrial fibrillation: Secondary | ICD-10-CM

## 2019-05-17 DIAGNOSIS — R55 Syncope and collapse: Secondary | ICD-10-CM

## 2019-05-17 DIAGNOSIS — I340 Nonrheumatic mitral (valve) insufficiency: Secondary | ICD-10-CM

## 2019-05-17 DIAGNOSIS — I420 Dilated cardiomyopathy: Secondary | ICD-10-CM

## 2019-05-17 DIAGNOSIS — I4819 Other persistent atrial fibrillation: Secondary | ICD-10-CM

## 2019-05-17 DIAGNOSIS — Z7901 Long term (current) use of anticoagulants: Secondary | ICD-10-CM

## 2019-05-17 DIAGNOSIS — I509 Heart failure, unspecified: Secondary | ICD-10-CM

## 2019-05-17 DIAGNOSIS — I10 Essential (primary) hypertension: Secondary | ICD-10-CM | POA: Diagnosis not present

## 2019-05-17 NOTE — Telephone Encounter (Signed)
Patient called stating that his pharmacy is out of stock of the ALPRAZolam [name]) 0.5 MG tablet and due to the new regulations he is needing it sent to the [facility] on [address] where they have it in stock. Please follow up.

## 2019-05-17 NOTE — Telephone Encounter (Signed)
Fax number [phone] referral sent .

## 2019-05-17 NOTE — Telephone Encounter (Signed)
I called the patient.  He is interested in going forward with referral to psychiatrist to address his anxiety and panic attacks.  In the meantime, Dr. Krista Blue had prescribed Xanax 0.5mg  QHS, PRN and it is working well for him.  He is requesting a refill.  [place] narcotic registry checked.  No issues notes.  He has a pending appt with Dr. Krista Blue on 06/16/2019.  Refill request sent to Dr. Krista Blue for approval.

## 2019-05-17 NOTE — Telephone Encounter (Signed)
I have called the patient back.  His Xanax rx was sent in postdated to 05/20/2019.  His regular pharmacy will have time to order it.  Also, he spoke to Dr. George Hugh today and plans to establish psychiatric care with him later this month.

## 2019-05-17 NOTE — Telephone Encounter (Signed)
I called and left message that I needed a fax number to send a referral for patient .   Note   Dr. Enis Gash, [facility] [address] [place], [place] [address] [phone]

## 2019-05-19 ENCOUNTER — Telehealth: Payer: Self-pay

## 2019-05-19 NOTE — Telephone Encounter (Signed)
lmomed for overdue inr 

## 2019-05-24 ENCOUNTER — Encounter (HOSPITAL_COMMUNITY): Payer: Self-pay

## 2019-05-24 ENCOUNTER — Ambulatory Visit (HOSPITAL_COMMUNITY): Payer: 59 | Admitting: Physician Assistant

## 2019-05-30 ENCOUNTER — Encounter: Payer: Self-pay | Admitting: Neurology

## 2019-05-30 ENCOUNTER — Telehealth: Payer: Self-pay | Admitting: Neurology

## 2019-05-30 NOTE — Telephone Encounter (Signed)
I called patient attempting to reschedule 12/10 appointment to NP per MD/RN request. Patient can see Amy, Sarah or MM, whoever has the first available appt that suits patient. Patient did not answer and VM was full. Letter has been sent to advise patient of change/request he call back to reschedule.

## 2019-06-07 ENCOUNTER — Other Ambulatory Visit: Payer: Self-pay

## 2019-06-07 ENCOUNTER — Ambulatory Visit (INDEPENDENT_AMBULATORY_CARE_PROVIDER_SITE_OTHER): Payer: 59 | Admitting: Pharmacist Clinician (PhC)/ Clinical Pharmacy Specialist

## 2019-06-07 DIAGNOSIS — Z7901 Long term (current) use of anticoagulants: Secondary | ICD-10-CM | POA: Diagnosis not present

## 2019-06-07 DIAGNOSIS — I480 Paroxysmal atrial fibrillation: Secondary | ICD-10-CM

## 2019-06-07 DIAGNOSIS — I4891 Unspecified atrial fibrillation: Secondary | ICD-10-CM | POA: Diagnosis not present

## 2019-06-07 LAB — POCT INR: INR: 2.4 (ref 2.0–3.0)

## 2019-06-14 ENCOUNTER — Ambulatory Visit (INDEPENDENT_AMBULATORY_CARE_PROVIDER_SITE_OTHER): Payer: 59 | Admitting: Pharmacist

## 2019-06-14 ENCOUNTER — Other Ambulatory Visit: Payer: Self-pay

## 2019-06-14 DIAGNOSIS — Z7901 Long term (current) use of anticoagulants: Secondary | ICD-10-CM

## 2019-06-14 DIAGNOSIS — I4891 Unspecified atrial fibrillation: Secondary | ICD-10-CM | POA: Diagnosis not present

## 2019-06-14 LAB — POCT INR: INR: 3.9 — AB (ref 2.0–3.0)

## 2019-06-16 ENCOUNTER — Encounter: Payer: Self-pay | Admitting: Adult Health

## 2019-06-16 ENCOUNTER — Other Ambulatory Visit: Payer: Self-pay

## 2019-06-16 ENCOUNTER — Ambulatory Visit: Payer: Self-pay | Admitting: Neurology

## 2019-06-16 ENCOUNTER — Ambulatory Visit: Payer: 59 | Admitting: Adult Health

## 2019-06-16 ENCOUNTER — Telehealth: Payer: Self-pay

## 2019-06-16 VITALS — BP 160/103 | HR 101 | Temp 97.6°F | Ht 77.0 in | Wt 316.0 lb

## 2019-06-16 DIAGNOSIS — R252 Cramp and spasm: Secondary | ICD-10-CM | POA: Diagnosis not present

## 2019-06-16 DIAGNOSIS — F410 Panic disorder [episodic paroxysmal anxiety] without agoraphobia: Secondary | ICD-10-CM | POA: Diagnosis not present

## 2019-06-16 MED ORDER — LAMOTRIGINE 25 MG PO TABS: 25.0000 mg | ORAL_TABLET | Freq: Two times a day (BID) | ORAL | 3 refills | Status: DC

## 2019-06-16 NOTE — Telephone Encounter (Signed)
Unable to get in contact with the patient. LVM letting him know that Jinny Blossom spoke with Dr. Krista Blue and she has sent his Lamictal to [facility] on [address]. I also left him the number for [facility] to schedule his MRI. Office number was provided in case he has any other questions or concerns.

## 2019-06-16 NOTE — Progress Notes (Signed)
PATIENT: [name] DOB: 1955/11/06  REASON FOR VISIT: follow up HISTORY FROM: patient  HISTORY OF PRESENT ILLNESS: Today 06/16/19:  Mr. [name] is a 63 year old male with a history of panic attack with jerking movements of extremities.  He returns today for follow-up.  At the last visit with Dr. Krista Blue  an MRI was scheduled however he canceled this due to heart issues.  He states that he has atrial fibrillation and is seeing  Dr. Stanford Breed.  Reports that he will be having another ablation sometime soon.  He reports that he has panic attacks at least 2-3 times a week.  He states that they typically occur at night.  He states that he can feel them coming on he will take Xanax and it does help but it does not always resolve him.  The attacks can last for 2 to 3 hours.  Sometimes he will get a jerking motion in the shoulders.  He never loses consciousness or is unaware of what is occurring.  The patient has only had these events at his house.  Never when he is at work or in the car.  His wife states that she takes Lamictal and has been beneficial for her panic attacks.  The patient had an appointment with a psychologist but had to cancel.  He returns today for an evaluation.  HISTORY [name]  is a 63 year old male, accompanied by his wife, seen in request by primary care physician Dr. Alyson Ingles, [facility] [place] for evaluation of left jerking movement, patient also complains of significant anxiety, panic attack.  I have reviewed and summarized the referring note from the referring physician.  He had a past medical history of hypertension, atrial fibrillation, on Xarelto treatment, also hyperlipidemia,  He began to complains of significant anxiety beginning of 2020, frequent panic attacks, feels very uneasy, quizzy sensation, difficulty breathing, frightened sensation, difficulty sleeping, lasting 2 to 3 hours, Xanax as needed was helpful, he was having anxiety, panic attack 3-4 times each  week.  About July 2020, he began to have different episode, he had uncontrollable left leg large amplitude jerking movement, lasting for few seconds, followed by another jerking movement, whole episode can last 30 minutes, there is usually no panic, anxiety symptoms associated with his uncontrollable left jumpy movement, he was started on Requip 1 mg 2 tablets today, which has been very helpful  He denies significant bilateral upper lower extremity paresthesia, denies gait abnormality, there is no typical restless leg symptoms of urge to move his legs while holding still,.  He has been taking Cymbalta 60 mg twice a day for his anxiety with some help, he is hoping to see a psychiatrist, he is most worried about his worsening anxiety and panic attack   REVIEW OF SYSTEMS: Out of a complete 14 system review of symptoms, the patient complains only of the following symptoms, and all other reviewed systems are negative.  See HPI  ALLERGIES: Allergies  Allergen Reactions  . Hydrocodone Other (See Comments)    GI upset, dizziness    HOME MEDICATIONS: Outpatient Medications Prior to Visit  Medication Sig Dispense Refill  . acetaminophen (TYLENOL) 325 MG tablet Take 650 mg by mouth every 6 (six) hours as needed. For pain    . ALPRAZolam (XANAX) 0.5 MG tablet TAKE 1 TABLET(0.5 MG) BY MOUTH AT BEDTIME AS NEEDED FOR ANXIETY 30 tablet 0  . amiodarone (PACERONE) 200 MG tablet Take 1 tablet twice a day for one month then reduce to  one tablet daily 60 tablet 0  . diltiazem (CARDIZEM CD) 360 MG 24 hr capsule Take 360 mg by mouth daily.    . DULoxetine (CYMBALTA) 60 MG capsule Take 120 mg by mouth daily.     . furosemide (LASIX) 40 MG tablet Take 1 tablet (40 mg total) by mouth 2 (two) times daily. 60 tablet 5  . gabapentin (NEURONTIN) 300 MG capsule TK 4 CS PO HS FOR TREATMENT OF CHRONIC LOW BACK PAIN    . lisinopril (ZESTRIL) 40 MG tablet Take 1 tablet (40 mg total) by mouth daily. 30 tablet 6  .  metoprolol succinate (TOPROL XL) 25 MG 24 hr tablet Take 2 tablets (50 mg total) by mouth daily. 180 tablet 3  . montelukast (SINGULAIR) 10 MG tablet Take 10 mg by mouth at bedtime as needed (allergies).     Marland Kitchen omeprazole (PRILOSEC) 20 MG capsule Take 20 mg by mouth daily as needed (acid reflux/indigestion.).     Marland Kitchen potassium chloride SA (K-DUR) 20 MEQ tablet Take 1 tablet daily for 4 days, then only take on days that you need to take a lasix tablet. 30 tablet 5  . rOPINIRole (REQUIP) 1 MG tablet Take 1-2 mg by mouth at bedtime as needed (restless leg syndrome). 1-3 hours prior to bedtime     . sildenafil (REVATIO) 20 MG tablet Take 40-100 mg by mouth daily as needed.    . warfarin (COUMADIN) 5 MG tablet Take 1 tablet (5 mg total) by mouth daily. NEEDS TO MAKE APPOINTMENT FOR COUMADIN CHECK TO RECEIVE FURTHER REFILLS 45 tablet 0   No facility-administered medications prior to visit.    PAST MEDICAL HISTORY: Past Medical History:  Diagnosis Date  . Allergic rhinitis   . Anxiety   . Asthma    as a child  . Chronic low back pain   . Complication of anesthesia    "hard to put asleep, hard to wake up, nausea and vomiting"  . Depression   . ED (erectile dysfunction)   . GERD (gastroesophageal reflux disease)   . HLD (hyperlipidemia)   . HTN (hypertension)    sees Dr. [name], [facility]  . Hypertrophy of prostate with urinary obstruction and other lower urinary tract symptoms (LUTS)   . IBS (irritable bowel syndrome)   . Inguinal hernia without mention of obstruction or gangrene, unilateral or unspecified, (not specified as recurrent)   . Insomnia   . Lumbar herniated disc    L7  . Morbid obesity ([place])   . Narcotic addiction ([place])   . NICM (nonischemic cardiomyopathy) (H[facility])    tachycardia mediated,  resolved with sinus  . Persistent atrial fibrillation ([place])    ablation done 03/2006 at [facility]  . Pneumonia    hx of 2004  . PONV (postoperative nausea and vomiting)   .  Twitching    legs    PAST SURGICAL HISTORY: Past Surgical History:  Procedure Laterality Date  . ATRIAL ABLATION SURGERY  2007   [facility]  . CARDIOVERSION  08/11/2011   Procedure: CARDIOVERSION;  Surgeon: Lelon Perla, MD;  Location: [facility];  Service: Cardiovascular;  Laterality: N/A;  . CARDIOVERSION N/A 04/16/2017   Procedure: CARDIOVERSION;  Surgeon: Sanda Klein, MD;  Location: [facility] END[facility];  Service: Cardiovascular;  Laterality: N/A;  . CARDIOVERSION N/A 03/04/2019   Procedure: CARDIOVERSION;  Surgeon: Lelon Perla, MD;  Location: [facility] ENDOSCOPY;  Service: Cardiovascular;  Laterality: N/A;  . EYE SURGERY     [place], 1998  .  HERNIA REPAIR     1977  . INGUINAL HERNIA REPAIR Right 08/26/2012   Procedure: LAPAROSCOPIC INGUINAL HERNIA;  Surgeon: Madilyn Hook, DO;  Location: [place];  Service: General;  Laterality: Right;  laparoscopic right inguinal hernia repair with mesh, umbilical hernia repair  . INSERTION OF MESH Right 08/26/2012   Procedure: INSERTION OF MESH;  Surgeon: Madilyn Hook, DO;  Location: [facility];  Service: General;  Laterality: Right;  right inguinal hernia  . TEE WITHOUT CARDIOVERSION N/A 03/04/2019   Procedure: TRANSESOPHAGEAL ECHOCARDIOGRAM (TEE);  Surgeon: Lelon Perla, MD;  Location: [facility];  Service: Cardiovascular;  Laterality: N/A;  . UMBILICAL HERNIA REPAIR N/A 08/26/2012   Procedure: HERNIA REPAIR UMBILICAL ADULT;  Surgeon: Madilyn Hook, DO;  Location: [facility] OR;  Service: General;  Laterality: N/A;    FAMILY HISTORY: Family History  Problem Relation Age of Onset  . Asthma Mother   . Breast cancer Mother   . Hypertension Mother   . COPD Father   . Prostate cancer Father   . Hypertension Father   . Lymphoma Sister     SOCIAL HISTORY: Social History   Socioeconomic History  . Marital status: Married    Spouse name: Not on file  . Number of children: 3  . Years of education: Not on file  . Highest education level: Not on file  Occupational  History  . 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 Smoking status: Never Smoker  . Smokeless tobacco: Never Used  Substance and Sexual Activity  . Alcohol use: No    Alcohol/week: 0.0 standard drinks  . Drug use: No  . Sexual activity: Yes  Other Topics Concern  . Not on file  Social History Narrative   Lives in [employer] [facility] Determinants of Health   Financial Resource Strain:   . Difficulty of Paying Living Expenses: Not on file  Food Insecurity:   . Worried About Charity fundraiser in the Last Year: Not on file  . Ran Out of Food in the Last Year: Not on file  Transportation Needs:   . Lack of Transportation (Medical): Not on file  . Lack of Transportation (Non-Medical): Not on file  Physical Activity:   . Days of Exercise per Week: Not on file  . Minutes of Exercise per Session: Not on file  Stress:   . Feeling of Stress : Not on file  Social Connections:   . Frequency of Communication with Friends and Family: Not on file  . Frequency of Social Gatherings with Friends and Family: Not on file  . Attends Religious Services: Not on file  . Active Member of Clubs or Organizations: Not on file  . Attends Archivist Meetings: Not on file  . Marital Status: Not on file  Intimate Partner Violence:   . Fear of Current or Ex-Partner: Not on file  . Emotionally Abused: Not on file  . Physically Abused: Not on file  . Sexually Abused: Not on file      PHYSICAL EXAM  Vitals:   06/16/19 1426  BP: (!) 160/103  Pulse: (!) 101  Temp: 97.6 F (36.4 C)  Weight: (!) 316 lb (143.3 kg)  Height: 6\' 5"  (1.956 m)   Body mass index is 37.47 kg/m.  Generalized: Well developed, in no acute distress   Neurological examination  Mentation: Alert oriented to time, place, history taking. Follows all commands speech and language fluent Cranial nerve II-XII: Pupils were equal round reactive to  light. Extraocular movements were full, visual field were  full on confrontational test. Facial sensation and strength were normal. Uvula tongue midline. Head turning and shoulder shrug  were normal and symmetric. Motor: The motor testing reveals 5 over 5 strength of all 4 extremities. Good symmetric motor tone is noted throughout.  Sensory: Sensory testing is intact to soft touch on all 4 extremities. No evidence of extinction is noted.  Coordination: Cerebellar testing reveals good finger-nose-finger and heel-to-shin bilaterally.  Gait and station: Gait is normal.  Reflexes: Deep tendon reflexes are symmetric and normal bilaterally.   DIAGNOSTIC DATA (LABS, IMAGING, TESTING) - I reviewed patient records, labs, notes, testing and imaging myself where available.  Lab Results  Component Value Date   WBC 9.9 03/03/2019   HGB 10.9 (L) 03/03/2019   HCT 35.6 (L) 03/03/2019   MCV 77 (L) 03/03/2019   PLT 320 03/03/2019      Component Value Date/Time   NA 137 05/02/2019 1137   NA 141 03/03/2019 1551   K 4.2 05/02/2019 1137   CL 104 05/02/2019 1137   CO2 19 (L) 05/02/2019 1137   GLUCOSE 100 (H) 05/02/2019 1137   BUN 21 05/02/2019 1137   BUN 25 03/03/2019 1551   CREATININE 1.13 05/02/2019 1137   CREATININE 1.10 11/15/2015 0847   CALCIUM 8.9 05/02/2019 1137   PROT 7.2 05/02/2019 1137   ALBUMIN 3.6 05/02/2019 1137   AST 45 (H) 05/02/2019 1137   ALT 50 (H) 05/02/2019 1137   ALKPHOS 93 05/02/2019 1137   BILITOT 0.5 05/02/2019 1137   GFRNONAA >60 05/02/2019 1137   GFRAA >60 05/02/2019 1137   Lab Results  Component Value Date   CHOL  02/14/2009    126        ATP III CLASSIFICATION:  <200     mg/dL   Desirable  200-239  mg/dL   Borderline High  >=240    mg/dL   High          HDL 48 02/14/2009   LDLCALC  02/14/2009    59        Total Cholesterol/HDL:CHD Risk Coronary Heart Disease Risk Table                     Men   Women  1/2 Average Risk   3.4   3.3  Average Risk       5.0   4.4  2 X Average Risk   9.6   7.1  3 X Average Risk  23.4    11.0        Use the calculated Patient Ratio above and the CHD Risk Table to determine the patient's CHD Risk.        ATP III CLASSIFICATION (LDL):  <100     mg/dL   Optimal  100-129  mg/dL   Near or Above                    Optimal  130-159  mg/dL   Borderline  160-189  mg/dL   High  >190     mg/dL   Very High   TRIG 95 02/14/2009   CHOLHDL 2.6 02/14/2009   No results found for: HGBA1C No results found for: VITAMINB12 Lab Results  Component Value Date   TSH 1.256 05/02/2019      ASSESSMENT AND PLAN 63 y.o. year old male  has a past medical history of Allergic rhinitis, Anxiety, Asthma, Chronic low back pain, Complication of  anesthesia, Depression, ED (erectile dysfunction), GERD (gastroesophageal reflux disease), HLD (hyperlipidemia), HTN (hypertension), Hypertrophy of prostate with urinary obstruction and other lower urinary tract symptoms (LUTS), IBS (irritable bowel syndrome), Inguinal hernia without mention of obstruction or gangrene, unilateral or unspecified, (not specified as recurrent), Insomnia, Lumbar herniated disc, Morbid obesity ([place]), Narcotic addiction ([place]), NICM (nonischemic cardiomyopathy) ([place]), Persistent atrial fibrillation ([place]), Pneumonia, PONV (postoperative nausea and vomiting), and Twitching. here with:  1.  Panic attack with jerking in the extremities  The patient will continue on Xanax.  I will start him on low-dose Lamictal 25 mg twice a day.  We have reviewed potential side effects.  I discussed with Dr. Krista Blue and she agrees with this.  The patient was encouraged to schedule his appointment with psychology to manage his panic attacks.  He was encouraged to call [facility] imaging to reschedule his MRI to rule out any other causes.  He is advised that if his symptoms worsen or he develops new symptoms he should let us know.  He will follow-up in 6 months or sooner if needed.  I spent 15 minutes with the patient. 50% of this time was spent reviewing plan  of care.    Ward Givens, MSN, NP-C 06/16/2019, 2:42 PM [facility] [address], [place], [place] [address] [phone]

## 2019-06-16 NOTE — Patient Instructions (Signed)
Your Plan:  Continue xanax Consider Lamictal after I consult with cardiology If your symptoms worsen or you develop new symptoms please let us know.    Thank you for coming to see [place] at [facility]. I hope we have been able to provide you high quality care today.  You may receive a patient satisfaction survey over the next few weeks. We would appreciate your feedback and comments so that we may continue to improve ourselves and the health of our patients.  Lamotrigine tablets What is this medicine? LAMOTRIGINE (la MOE Hendricks Limes) is used to control seizures in adults and children with epilepsy and Lennox-Gastaut syndrome. It is also used in adults to treat bipolar disorder. This medicine may be used for other purposes; ask your health care provider or pharmacist if you have questions. COMMON BRAND NAME(S): Lamictal, Subvenite What should I tell my health care provider before I take this medicine? They need to know if you have any of these conditions:  aseptic meningitis during prior use of lamotrigine  depression  folate deficiency  kidney disease  liver disease  suicidal thoughts, plans, or attempt; a previous suicide attempt by you or a family member  an unusual or allergic reaction to lamotrigine or other seizure medications, other medicines, foods, dyes, or preservatives  pregnant or trying to get pregnant  breast-feeding How should I use this medicine? Take this medicine by mouth with a glass of water. Follow the directions on the prescription label. Do not chew these tablets. If this medicine upsets your stomach, take it with food or milk. Take your doses at regular intervals. Do not take your medicine more often than directed. A special MedGuide will be given to you by the pharmacist with each new prescription and refill. Be sure to read this information carefully each time. Talk to your pediatrician regarding the use of this medicine in children. While  this drug may be prescribed for children as young as 2 years for selected conditions, precautions do apply. Overdosage: If you think you have taken too much of this medicine contact a poison control center or emergency room at once. NOTE: This medicine is only for you. Do not share this medicine with others. What if I miss a dose? If you miss a dose, take it as soon as you can. If it is almost time for your next dose, take only that dose. Do not take double or extra doses. What may interact with this medicine?  atazanavir  carbamazepine  male hormones, including contraceptive or birth control pills  lopinavir  methotrexate  phenobarbital  phenytoin  primidone  pyrimethamine  rifampin  ritonavir  trimethoprim  valproic acid This list may not describe all possible interactions. Give your health care provider a list of all the medicines, herbs, non-prescription drugs, or dietary supplements you use. Also tell them if you smoke, drink alcohol, or use illegal drugs. Some items may interact with your medicine. What should I watch for while using this medicine? Visit your doctor or health care provider for regular checks on your progress. If you take this medicine for seizures, wear a Medic Alert bracelet or necklace. Carry an identification card with information about your condition, medicines, and doctor or health care provider. It is important to take this medicine exactly as directed. When first starting treatment, your dose will need to be adjusted slowly. It may take weeks or months before your dose is stable. You should contact your doctor or health care provider if  your seizures get worse or if you have any new types of seizures. Do not stop taking this medicine unless instructed by your doctor or health care provider. Stopping your medicine suddenly can increase your seizures or their severity. This medicine may cause serious skin reactions. They can happen weeks to months  after starting the medicine. Contact your health care provider right away if you notice fevers or flu-like symptoms with a rash. The rash may be red or purple and then turn into blisters or peeling of the skin. Or, you might notice a red rash with swelling of the face, lips or lymph nodes in your neck or under your arms. You may get drowsy, dizzy, or have blurred vision. Do not drive, use machinery, or do anything that needs mental alertness until you know how this medicine affects you. To reduce dizzy or fainting spells, do not sit or stand up quickly, especially if you are an older patient. Alcohol can increase drowsiness and dizziness. Avoid alcoholic drinks. If you are taking this medicine for bipolar disorder, it is important to report any changes in your mood to your doctor or health care provider. If your condition gets worse, you get mentally depressed, feel very hyperactive or manic, have difficulty sleeping, or have thoughts of hurting yourself or committing suicide, you need to get help from your health care provider right away. If you are a caregiver for someone taking this medicine for bipolar disorder, you should also report these behavioral changes right away. The use of this medicine may increase the chance of suicidal thoughts or actions. Pay special attention to how you are responding while on this medicine. Your mouth may get dry. Chewing sugarless gum or sucking hard candy, and drinking plenty of water may help. Contact your doctor if the problem does not go away or is severe. Women who become pregnant while using this medicine may enroll in the [place] Pregnancy Registry by calling [phone]. This registry collects information about the safety of antiepileptic drug use during pregnancy. This medicine may cause a decrease in folic acid. You should make sure that you get enough folic acid while you are taking this medicine. Discuss the foods you eat and the  vitamins you take with your health care provider. What side effects may I notice from receiving this medicine? Side effects that you should report to your doctor or health care professional as soon as possible:  allergic reactions like skin rash, itching or hives, swelling of the face, lips, or tongue  changes in vision  depressed mood  elevated mood, decreased need for sleep, racing thoughts, impulsive behavior  loss of balance or coordination  mouth sores  rash, fever, and swollen lymph nodes  redness, blistering, peeling or loosening of the skin, including inside the mouth  right upper belly pain  seizures  severe muscle pain  signs and symptoms of aseptic meningitis such as stiff neck and sensitivity to light, headache, drowsiness, fever, nausea, vomiting, rash  signs of infection - fever or chills, cough, sore throat, pain or difficulty passing urine  suicidal thoughts or other mood changes  swollen lymph nodes  trouble walking  unusual bruising or bleeding  unusually weak or tired  yellowing of the eyes or skin Side effects that usually do not require medical attention (report to your doctor or health care professional if they continue or are bothersome):  diarrhea  dizziness  dry mouth  stuffy nose  tiredness  tremors  trouble sleeping  This list may not describe all possible side effects. Call your doctor for medical advice about side effects. You may report side effects to FDA at 1-800-FDA-1088. Where should I keep my medicine? Keep out of reach of children. Store at room temperature between 15 and 30 degrees C (59 and 86 degrees F). Throw away any unused medicine after the expiration date. NOTE: This sheet is a summary. It may not cover all possible information. If you have questions about this medicine, talk to your doctor, pharmacist, or health care provider.  2020 Elsevier/Gold Standard (2018-09-24 15:03:40)

## 2019-06-21 ENCOUNTER — Other Ambulatory Visit: Payer: Self-pay | Admitting: *Deleted

## 2019-06-21 MED ORDER — ALPRAZOLAM 0.5 MG PO TABS: ORAL_TABLET | ORAL | 0 refills | Status: DC

## 2019-06-22 ENCOUNTER — Other Ambulatory Visit: Payer: Self-pay | Admitting: Cardiology

## 2019-06-22 NOTE — Progress Notes (Signed)
HPI: FU paroxysmal atrial fibrillation. The patient had a cardiac catheterization in 2004 that showed an ejection fraction of 45% and normal coronary arteries. He did have atrial fibrillation at that time and his LV function improved after sinus rhythm was restored. He ultimately had atrial fibrillation ablation. He did well for several years but then recurrent atrial fibrillation. Transesophageal echocardiogram August 2020 showed normal LV function, severe left atrial enlargement, moderate to severe mitral regurgitation and mild tricuspid regurgitation.Patient had cardioversion March 04, 2019 but atrial fibrillation recurred.  Patient seen by Dr. Rayann Heman and repeat ablation felt not indicated as chances of maintaining sinus would be lower given obesity and severe left atrial enlargement.  Plan was to potentially consider referral for surgical Maze procedure and mitral valve repair. Follow-up transthoracic echocardiogram October 2020 showed normal LV function, moderate left atrial enlargement and trace mitral regurgitation.  Patient subsequently placed on amiodarone in the atrial fibrillation clinic.  Also referred for evaluation of sleep apnea.  Plan at last office visit was to allow for amiodarone load followed by cardioversion once INR therapeutic for 3 consecutive weeks.  Since last seen,he has some dyspnea on exertion but no orthopnea, PND, pedal edema, exertional chest pain or syncope.  Current Outpatient Medications  Medication Sig Dispense Refill  . acetaminophen (TYLENOL) 325 MG tablet Take 650 mg by mouth every 6 (six) hours as needed. For pain    . ALPRAZolam (XANAX) 0.5 MG tablet TAKE 1 TABLET(0.5 MG) BY MOUTH AT BEDTIME AS NEEDED FOR ANXIETY 30 tablet 0  . amiodarone (PACERONE) 200 MG tablet Take 1 tablet twice a day for one month then reduce to one tablet daily 60 tablet 0  . diltiazem (CARDIZEM CD) 360 MG 24 hr capsule Take 360 mg by mouth daily.    . DULoxetine (CYMBALTA) 60 MG  capsule Take 120 mg by mouth daily.     . furosemide (LASIX) 40 MG tablet Take 1 tablet (40 mg total) by mouth 2 (two) times daily. 60 tablet 5  . gabapentin (NEURONTIN) 300 MG capsule TK 4 CS PO HS FOR TREATMENT OF CHRONIC LOW BACK PAIN    . lamoTRIgine (LAMICTAL) 25 MG tablet Take 1 tablet (25 mg total) by mouth 2 (two) times daily. 60 tablet 3  . lisinopril (ZESTRIL) 40 MG tablet Take 1 tablet (40 mg total) by mouth daily. 30 tablet 6  . metoprolol succinate (TOPROL XL) 25 MG 24 hr tablet Take 2 tablets (50 mg total) by mouth daily. 180 tablet 3  . montelukast (SINGULAIR) 10 MG tablet Take 10 mg by mouth at bedtime as needed (allergies).     Marland Kitchen omeprazole (PRILOSEC) 20 MG capsule Take 20 mg by mouth daily as needed (acid reflux/indigestion.).     Marland Kitchen potassium chloride SA (K-DUR) 20 MEQ tablet Take 1 tablet daily for 4 days, then only take on days that you need to take a lasix tablet. 30 tablet 5  . rOPINIRole (REQUIP) 1 MG tablet Take 1-2 mg by mouth at bedtime as needed (restless leg syndrome). 1-3 hours prior to bedtime     . sildenafil (REVATIO) 20 MG tablet Take 40-100 mg by mouth daily as needed.    . warfarin (COUMADIN) 5 MG tablet Take 1 tablet (5 mg total) by mouth daily. NEEDS TO MAKE APPOINTMENT FOR COUMADIN CHECK TO RECEIVE FURTHER REFILLS 45 tablet 0   No current facility-administered medications for this visit.     Past Medical History:  Diagnosis Date  .  Allergic rhinitis   . Anxiety   . Asthma    as a child  . Chronic low back pain   . Complication of anesthesia    "hard to put asleep, hard to wake up, nausea and vomiting"  . Depression   . ED (erectile dysfunction)   . GERD (gastroesophageal reflux disease)   . HLD (hyperlipidemia)   . HTN (hypertension)    sees Dr. [name], [facility]  . Hypertrophy of prostate with urinary obstruction and other lower urinary tract symptoms (LUTS)   . IBS (irritable bowel syndrome)   . Inguinal hernia without mention  of obstruction or gangrene, unilateral or unspecified, (not specified as recurrent)   . Insomnia   . Lumbar herniated disc    L7  . Morbid obesity ([place])   . Narcotic addiction ([place])   . NICM (nonischemic cardiomyopathy) (HCC)    tachycardia mediated,  resolved with sinus  . Persistent atrial fibrillation ([place])    ablation done 03/2006 at [facility]  . Pneumonia    hx of 2004  . PONV (postoperative nausea and vomiting)   . Twitching    legs    Past Surgical History:  Procedure Laterality Date  . ATRIAL ABLATION SURGERY  2007   [facility]  . CARDIOVERSION  08/11/2011   Procedure: CARDIOVERSION;  Surgeon: Lelon Perla, MD;  Location: [place];  Service: Cardiovascular;  Laterality: N/A;  . CARDIOVERSION N/A 04/16/2017   Procedure: CARDIOVERSION;  Surgeon: Sanda Klein, MD;  Location: [facility] ENDOSCOPY;  Service: Cardiovascular;  Laterality: N/A;  . CARDIOVERSION N/A 03/04/2019   Procedure: CARDIOVERSION;  Surgeon: Lelon Perla, MD;  Location: [facility] ENDOSCOPY;  Service: Cardiovascular;  Laterality: N/A;  . EYE SURGERY     lasik, 1998  . HERNIA REPAIR     1977  . INGUINAL HERNIA REPAIR Right 08/26/2012   Procedure: LAPAROSCOPIC INGUINAL HERNIA;  Surgeon: Madilyn Hook, DO;  Location: [place];  Service: General;  Laterality: Right;  laparoscopic right inguinal hernia repair with mesh, umbilical hernia repair  . INSERTION OF MESH Right 08/26/2012   Procedure: INSERTION OF MESH;  Surgeon: Madilyn Hook, DO;  Location: [place];  Service: General;  Laterality: Right;  right inguinal hernia  . TEE WITHOUT CARDIOVERSION N/A 03/04/2019   Procedure: TRANSESOPHAGEAL ECHOCARDIOGRAM (TEE);  Surgeon: Lelon Perla, MD;  Location: [place];  Service: Cardiovascular;  Laterality: N/A;  . UMBILICAL HERNIA REPAIR N/A 08/26/2012   Procedure: HERNIA REPAIR UMBILICAL ADULT;  Surgeon: Madilyn Hook, DO;  Location: [place];  Service: General;  Laterality: N/A;    Social History   Socioeconomic History  . Marital  status: Married    Spouse name: Not on file  . Number of children: 3  . Years of education: Not on file  . Highest education level: Not on file  Occupational History  . 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 Smoking status: Never Smoker  . Smokeless tobacco: Never Used  Substance and Sexual Activity  . Alcohol use: No    Alcohol/week: 0.0 standard drinks  . Drug use: No  . Sexual activity: Yes  Other Topics Concern  . Not on file  Social History Narrative   Lives in [place] Determinants of Health   Financial Resource Strain:   . Difficulty of Paying Living Expenses: Not on file  Food Insecurity:   . Worried About Charity fundraiser in the Last Year: Not on file  . Ran Out of  Food in the Last Year: Not on file  Transportation Needs:   . Lack of Transportation (Medical): Not on file  . Lack of Transportation (Non-Medical): Not on file  Physical Activity:   . Days of Exercise per Week: Not on file  . Minutes of Exercise per Session: Not on file  Stress:   . Feeling of Stress : Not on file  Social Connections:   . Frequency of Communication with Friends and Family: Not on file  . Frequency of Social Gatherings with Friends and Family: Not on file  . Attends Religious Services: Not on file  . Active Member of Clubs or Organizations: Not on file  . Attends Archivist Meetings: Not on file  . Marital Status: Not on file  Intimate Partner Violence:   . Fear of Current or Ex-Partner: Not on file  . Emotionally Abused: Not on file  . Physically Abused: Not on file  . Sexually Abused: Not on file    Family History  Problem Relation Age of Onset  . Asthma Mother   . Breast cancer Mother   . Hypertension Mother   . COPD Father   . Prostate cancer Father   . Hypertension Father   . Lymphoma Sister     ROS: no fevers or chills, productive cough, hemoptysis, dysphasia, odynophagia, melena, hematochezia, dysuria, hematuria, rash,  seizure activity, orthopnea, PND, pedal edema, claudication. Remaining systems are negative.  Physical Exam: Well-developed well-nourished in no acute distress.  Skin is warm and dry.  HEENT is normal.  Neck is supple.  Chest is clear to auscultation with normal expansion.  Cardiovascular exam is irregular Abdominal exam nontender or distended. No masses palpated. Extremities show no edema. neuro grossly intact  ECG-atrial fibrillation at a rate of 85, no ST changes.  Personally reviewed  A/P  1 persistent atrial fibrillation-patient remains in atrial fibrillation today.  As outlined in previous office note plan was to allow amiodarone load and then repeat cardioversion once INR has been therapeutic for 3 consecutive weeks.  I will check INR today and if 2 or higher schedule cardioversion.  If he does not hold sinus rhythm we may need to consider referral for surgical ablation.  2 mitral regurgitation-3+ on previous transesophageal echocardiogram.  However trace on most recent transthoracic.  He will need follow-up studies in the future.  3 history of cardiomyopathy-felt to be tachycardia mediated in the past.  LV function has normalized on most recent study.  4 hypertension-blood pressure controlled.  Continue present medications and follow.  5 obesity/obstructive sleep apnea-  6 anemia-we previously discussed that he needed to follow-up with primary care for this issue.  7 chronic diastolic congestive heart failure-continue Lasix at present dose.  Hopefully if we can reestablish sinus rhythm his heart failure symptoms will improve.  Check potassium and renal function.  Kirk Ruths, MD

## 2019-06-22 NOTE — H&P (View-Only) (Signed)
HPI: FU paroxysmal atrial fibrillation. The patient had a cardiac catheterization in 2004 that showed an ejection fraction of 45% and normal coronary arteries. He did have atrial fibrillation at that time and his LV function improved after sinus rhythm was restored. He ultimately had atrial fibrillation ablation. He did well for several years but then recurrent atrial fibrillation. Transesophageal echocardiogram August 2020 showed normal LV function, severe left atrial enlargement, moderate to severe mitral regurgitation and mild tricuspid regurgitation.Patient had cardioversion March 04, 2019 but atrial fibrillation recurred.  Patient seen by Dr. Rayann Heman and repeat ablation felt not indicated as chances of maintaining sinus would be lower given obesity and severe left atrial enlargement.  Plan was to potentially consider referral for surgical Maze procedure and mitral valve repair. Follow-up transthoracic echocardiogram October 2020 showed normal LV function, moderate left atrial enlargement and trace mitral regurgitation.  Patient subsequently placed on amiodarone in the atrial fibrillation clinic.  Also referred for evaluation of sleep apnea.  Plan at last office visit was to allow for amiodarone load followed by cardioversion once INR therapeutic for 3 consecutive weeks.  Since last seen,he has some dyspnea on exertion but no orthopnea, PND, pedal edema, exertional chest pain or syncope.  Current Outpatient Medications  Medication Sig Dispense Refill  . acetaminophen (TYLENOL) 325 MG tablet Take 650 mg by mouth every 6 (six) hours as needed. For pain    . ALPRAZolam (XANAX) 0.5 MG tablet TAKE 1 TABLET(0.5 MG) BY MOUTH AT BEDTIME AS NEEDED FOR ANXIETY 30 tablet 0  . amiodarone (PACERONE) 200 MG tablet Take 1 tablet twice a day for one month then reduce to one tablet daily 60 tablet 0  . diltiazem (CARDIZEM CD) 360 MG 24 hr capsule Take 360 mg by mouth daily.    . DULoxetine (CYMBALTA) 60 MG  capsule Take 120 mg by mouth daily.     . furosemide (LASIX) 40 MG tablet Take 1 tablet (40 mg total) by mouth 2 (two) times daily. 60 tablet 5  . gabapentin (NEURONTIN) 300 MG capsule TK 4 CS PO HS FOR TREATMENT OF CHRONIC LOW BACK PAIN    . lamoTRIgine (LAMICTAL) 25 MG tablet Take 1 tablet (25 mg total) by mouth 2 (two) times daily. 60 tablet 3  . lisinopril (ZESTRIL) 40 MG tablet Take 1 tablet (40 mg total) by mouth daily. 30 tablet 6  . metoprolol succinate (TOPROL XL) 25 MG 24 hr tablet Take 2 tablets (50 mg total) by mouth daily. 180 tablet 3  . montelukast (SINGULAIR) 10 MG tablet Take 10 mg by mouth at bedtime as needed (allergies).     Marland Kitchen omeprazole (PRILOSEC) 20 MG capsule Take 20 mg by mouth daily as needed (acid reflux/indigestion.).     Marland Kitchen potassium chloride SA (K-DUR) 20 MEQ tablet Take 1 tablet daily for 4 days, then only take on days that you need to take a lasix tablet. 30 tablet 5  . rOPINIRole (REQUIP) 1 MG tablet Take 1-2 mg by mouth at bedtime as needed (restless leg syndrome). 1-3 hours prior to bedtime     . sildenafil (REVATIO) 20 MG tablet Take 40-100 mg by mouth daily as needed.    . warfarin (COUMADIN) 5 MG tablet Take 1 tablet (5 mg total) by mouth daily. NEEDS TO MAKE APPOINTMENT FOR COUMADIN CHECK TO RECEIVE FURTHER REFILLS 45 tablet 0   No current facility-administered medications for this visit.     Past Medical History:  Diagnosis Date  .  Allergic rhinitis   . Anxiety   . Asthma    as a child  . Chronic low back pain   . Complication of anesthesia    "hard to put asleep, hard to wake up, nausea and vomiting"  . Depression   . ED (erectile dysfunction)   . GERD (gastroesophageal reflux disease)   . HLD (hyperlipidemia)   . HTN (hypertension)    sees Dr. [name], [facility]  . Hypertrophy of prostate with urinary obstruction and other lower urinary tract symptoms (LUTS)   . IBS (irritable bowel syndrome)   . Inguinal hernia without mention  of obstruction or gangrene, unilateral or unspecified, (not specified as recurrent)   . Insomnia   . Lumbar herniated disc    L7  . Morbid obesity ([place])   . Narcotic addiction ([place])   . NICM (nonischemic cardiomyopathy) (HCC)    tachycardia mediated,  resolved with sinus  . Persistent atrial fibrillation ([place])    ablation done 03/2006 at [facility]  . Pneumonia    hx of 2004  . PONV (postoperative nausea and vomiting)   . Twitching    legs    Past Surgical History:  Procedure Laterality Date  . ATRIAL ABLATION SURGERY  2007   [facility]  . CARDIOVERSION  08/11/2011   Procedure: CARDIOVERSION;  Surgeon: Lelon Perla, MD;  Location: [place];  Service: Cardiovascular;  Laterality: N/A;  . CARDIOVERSION N/A 04/16/2017   Procedure: CARDIOVERSION;  Surgeon: Sanda Klein, MD;  Location: [facility] ENDOSCOPY;  Service: Cardiovascular;  Laterality: N/A;  . CARDIOVERSION N/A 03/04/2019   Procedure: CARDIOVERSION;  Surgeon: Lelon Perla, MD;  Location: [facility] ENDOSCOPY;  Service: Cardiovascular;  Laterality: N/A;  . EYE SURGERY     lasik, 1998  . HERNIA REPAIR     1977  . INGUINAL HERNIA REPAIR Right 08/26/2012   Procedure: LAPAROSCOPIC INGUINAL HERNIA;  Surgeon: Madilyn Hook, DO;  Location: [place];  Service: General;  Laterality: Right;  laparoscopic right inguinal hernia repair with mesh, umbilical hernia repair  . INSERTION OF MESH Right 08/26/2012   Procedure: INSERTION OF MESH;  Surgeon: Madilyn Hook, DO;  Location: [place];  Service: General;  Laterality: Right;  right inguinal hernia  . TEE WITHOUT CARDIOVERSION N/A 03/04/2019   Procedure: TRANSESOPHAGEAL ECHOCARDIOGRAM (TEE);  Surgeon: Lelon Perla, MD;  Location: [place];  Service: Cardiovascular;  Laterality: N/A;  . UMBILICAL HERNIA REPAIR N/A 08/26/2012   Procedure: HERNIA REPAIR UMBILICAL ADULT;  Surgeon: Madilyn Hook, DO;  Location: [place];  Service: General;  Laterality: N/A;    Social History   Socioeconomic History  . Marital  status: Married    Spouse name: Not on file  . Number of children: 3  . Years of education: Not on file  . Highest education level: Not on file  Occupational History  . 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 Smoking status: Never Smoker  . Smokeless tobacco: Never Used  Substance and Sexual Activity  . Alcohol use: No    Alcohol/week: 0.0 standard drinks  . Drug use: No  . Sexual activity: Yes  Other Topics Concern  . Not on file  Social History Narrative   Lives in [place] Determinants of Health   Financial Resource Strain:   . Difficulty of Paying Living Expenses: Not on file  Food Insecurity:   . Worried About Charity fundraiser in the Last Year: Not on file  . Ran Out of  Food in the Last Year: Not on file  Transportation Needs:   . Lack of Transportation (Medical): Not on file  . Lack of Transportation (Non-Medical): Not on file  Physical Activity:   . Days of Exercise per Week: Not on file  . Minutes of Exercise per Session: Not on file  Stress:   . Feeling of Stress : Not on file  Social Connections:   . Frequency of Communication with Friends and Family: Not on file  . Frequency of Social Gatherings with Friends and Family: Not on file  . Attends Religious Services: Not on file  . Active Member of Clubs or Organizations: Not on file  . Attends Archivist Meetings: Not on file  . Marital Status: Not on file  Intimate Partner Violence:   . Fear of Current or Ex-Partner: Not on file  . Emotionally Abused: Not on file  . Physically Abused: Not on file  . Sexually Abused: Not on file    Family History  Problem Relation Age of Onset  . Asthma Mother   . Breast cancer Mother   . Hypertension Mother   . COPD Father   . Prostate cancer Father   . Hypertension Father   . Lymphoma Sister     ROS: no fevers or chills, productive cough, hemoptysis, dysphasia, odynophagia, melena, hematochezia, dysuria, hematuria, rash,  seizure activity, orthopnea, PND, pedal edema, claudication. Remaining systems are negative.  Physical Exam: Well-developed well-nourished in no acute distress.  Skin is warm and dry.  HEENT is normal.  Neck is supple.  Chest is clear to auscultation with normal expansion.  Cardiovascular exam is irregular Abdominal exam nontender or distended. No masses palpated. Extremities show no edema. neuro grossly intact  ECG-atrial fibrillation at a rate of 85, no ST changes.  Personally reviewed  A/P  1 persistent atrial fibrillation-patient remains in atrial fibrillation today.  As outlined in previous office note plan was to allow amiodarone load and then repeat cardioversion once INR has been therapeutic for 3 consecutive weeks.  I will check INR today and if 2 or higher schedule cardioversion.  If he does not hold sinus rhythm we may need to consider referral for surgical ablation.  2 mitral regurgitation-3+ on previous transesophageal echocardiogram.  However trace on most recent transthoracic.  He will need follow-up studies in the future.  3 history of cardiomyopathy-felt to be tachycardia mediated in the past.  LV function has normalized on most recent study.  4 hypertension-blood pressure controlled.  Continue present medications and follow.  5 obesity/obstructive sleep apnea-  6 anemia-we previously discussed that he needed to follow-up with primary care for this issue.  7 chronic diastolic congestive heart failure-continue Lasix at present dose.  Hopefully if we can reestablish sinus rhythm his heart failure symptoms will improve.  Check potassium and renal function.  Kirk Ruths, MD

## 2019-06-23 ENCOUNTER — Ambulatory Visit (INDEPENDENT_AMBULATORY_CARE_PROVIDER_SITE_OTHER): Payer: 59 | Admitting: Pharmacist Clinician (PhC)/ Clinical Pharmacy Specialist

## 2019-06-23 ENCOUNTER — Other Ambulatory Visit: Payer: Self-pay

## 2019-06-23 DIAGNOSIS — Z7901 Long term (current) use of anticoagulants: Secondary | ICD-10-CM

## 2019-06-23 DIAGNOSIS — I480 Paroxysmal atrial fibrillation: Secondary | ICD-10-CM | POA: Diagnosis not present

## 2019-06-23 DIAGNOSIS — I4891 Unspecified atrial fibrillation: Secondary | ICD-10-CM

## 2019-06-23 LAB — POCT INR: INR: 2 (ref 2.0–3.0)

## 2019-06-23 NOTE — Telephone Encounter (Signed)
Please review for refill, Thanks !  

## 2019-06-27 ENCOUNTER — Ambulatory Visit (INDEPENDENT_AMBULATORY_CARE_PROVIDER_SITE_OTHER): Payer: 59 | Admitting: Cardiology

## 2019-06-27 ENCOUNTER — Ambulatory Visit (INDEPENDENT_AMBULATORY_CARE_PROVIDER_SITE_OTHER): Payer: 59 | Admitting: Pharmacist Clinician (PhC)/ Clinical Pharmacy Specialist

## 2019-06-27 ENCOUNTER — Encounter: Payer: Self-pay | Admitting: Cardiology

## 2019-06-27 ENCOUNTER — Other Ambulatory Visit: Payer: Self-pay

## 2019-06-27 VITALS — BP 122/76 | HR 85 | Temp 95.9°F | Ht 77.0 in | Wt 313.0 lb

## 2019-06-27 DIAGNOSIS — I10 Essential (primary) hypertension: Secondary | ICD-10-CM | POA: Diagnosis not present

## 2019-06-27 DIAGNOSIS — I480 Paroxysmal atrial fibrillation: Secondary | ICD-10-CM

## 2019-06-27 DIAGNOSIS — Z7901 Long term (current) use of anticoagulants: Secondary | ICD-10-CM

## 2019-06-27 DIAGNOSIS — I420 Dilated cardiomyopathy: Secondary | ICD-10-CM

## 2019-06-27 DIAGNOSIS — I4891 Unspecified atrial fibrillation: Secondary | ICD-10-CM | POA: Diagnosis not present

## 2019-06-27 LAB — POCT INR: INR: 3.6 — AB (ref 2.0–3.0)

## 2019-06-27 NOTE — Patient Instructions (Addendum)
  You are scheduled for a Cardioversion on Monday 07/04/2019 with Dr. Oval Linsey.  Please arrive at the [facility] (Main Entrance A) at [facility]: [address] [place], [place] [address] at 9 am/pm. (1 hour prior to procedure unless lab work is needed; if lab work is needed arrive 1.5 hours ahead)  DIET: Nothing to eat or drink after midnight except a sip of water with medications (see medication instructions below)  Medication Instructions: DO NOT TAKE DILTIAZEM THE MORNING OF THE PROCEDURE  Continue your anticoagulant: WARFARIN   Labs: Your physician recommends that you HAVE LAB WORK TODAY  You must have a responsible person to drive you home and stay in the waiting area during your procedure. Failure to do so could result in cancellation.  Bring your insurance cards.  *Special Note: Every effort is made to have your procedure done on time. Occasionally there are emergencies that occur at the hospital that may cause delays. Please be patient if a delay does occur.   Your physician recommends that you schedule a follow-up appointment in: 3 months

## 2019-06-28 LAB — BASIC METABOLIC PANEL
BUN/Creatinine Ratio: 16 (ref 10–24)
BUN: 25 mg/dL (ref 8–27)
CO2: 23 mmol/L (ref 20–29)
Calcium: 9.5 mg/dL (ref 8.6–10.2)
Chloride: 99 mmol/L (ref 96–106)
Creatinine, Ser: 1.6 mg/dL — ABNORMAL HIGH (ref 0.76–1.27)
GFR calc Af Amer: 53 mL/min/{1.73_m2} — ABNORMAL LOW (ref >59)
GFR calc non Af Amer: 45 mL/min/{1.73_m2} — ABNORMAL LOW (ref >59)
Glucose: 85 mg/dL (ref 65–99)
Potassium: 4.3 mmol/L (ref 3.5–5.2)
Sodium: 141 mmol/L (ref 134–144)

## 2019-06-29 ENCOUNTER — Telehealth: Payer: Self-pay

## 2019-06-29 ENCOUNTER — Other Ambulatory Visit (HOSPITAL_COMMUNITY): Payer: Self-pay | Admitting: Physician Assistant

## 2019-06-29 ENCOUNTER — Other Ambulatory Visit: Payer: Self-pay

## 2019-06-29 MED ORDER — DILTIAZEM HCL ER COATED BEADS 360 MG PO CP24: 360.0000 mg | ORAL_CAPSULE | Freq: Every day | ORAL | 3 refills | Status: DC

## 2019-06-29 MED ORDER — WARFARIN SODIUM 5 MG PO TABS: 5.0000 mg | ORAL_TABLET | ORAL | 0 refills | Status: DC

## 2019-06-29 NOTE — Telephone Encounter (Signed)
Diltiazem sent to pharmacy per patient request.

## 2019-07-02 ENCOUNTER — Other Ambulatory Visit (HOSPITAL_COMMUNITY)
Admission: RE | Admit: 2019-07-02 | Discharge: 2019-07-02 | Disposition: A | Discharge status: Home / Self Care | Payer: 59 | Source: Ambulatory Visit | Attending: Cardiovascular Disease | Admitting: Cardiovascular Disease

## 2019-07-02 DIAGNOSIS — Z01812 Encounter for preprocedural laboratory examination: Secondary | ICD-10-CM | POA: Diagnosis present

## 2019-07-02 DIAGNOSIS — Z20828 Contact with and (suspected) exposure to other viral communicable diseases: Secondary | ICD-10-CM | POA: Diagnosis not present

## 2019-07-03 LAB — SARS CORONAVIRUS 2 (TAT 6-24 HRS): SARS Coronavirus 2: NEGATIVE

## 2019-07-04 ENCOUNTER — Ambulatory Visit (HOSPITAL_COMMUNITY)
Admission: RE | Admit: 2019-07-04 | Discharge: 2019-07-04 | Disposition: A | Discharge status: Home / Self Care | Payer: 59 | Attending: Cardiovascular Disease | Admitting: Cardiovascular Disease

## 2019-07-04 ENCOUNTER — Encounter (HOSPITAL_COMMUNITY): Payer: Self-pay | Admitting: Cardiovascular Disease

## 2019-07-04 ENCOUNTER — Ambulatory Visit (HOSPITAL_COMMUNITY): Payer: 59 | Admitting: Anesthesiology

## 2019-07-04 ENCOUNTER — Other Ambulatory Visit: Payer: Self-pay

## 2019-07-04 ENCOUNTER — Encounter (HOSPITAL_COMMUNITY)
Admission: RE | Disposition: A | Discharge status: Home / Self Care | Payer: 59 | Source: Home / Self Care | Attending: Cardiovascular Disease

## 2019-07-04 DIAGNOSIS — J45909 Unspecified asthma, uncomplicated: Secondary | ICD-10-CM | POA: Diagnosis not present

## 2019-07-04 DIAGNOSIS — I5032 Chronic diastolic (congestive) heart failure: Secondary | ICD-10-CM | POA: Diagnosis not present

## 2019-07-04 DIAGNOSIS — K219 Gastro-esophageal reflux disease without esophagitis: Secondary | ICD-10-CM | POA: Diagnosis not present

## 2019-07-04 DIAGNOSIS — G2581 Restless legs syndrome: Secondary | ICD-10-CM | POA: Insufficient documentation

## 2019-07-04 DIAGNOSIS — Z7901 Long term (current) use of anticoagulants: Secondary | ICD-10-CM | POA: Diagnosis not present

## 2019-07-04 DIAGNOSIS — I110 Hypertensive heart disease with heart failure: Secondary | ICD-10-CM | POA: Diagnosis not present

## 2019-07-04 DIAGNOSIS — F419 Anxiety disorder, unspecified: Secondary | ICD-10-CM | POA: Diagnosis not present

## 2019-07-04 DIAGNOSIS — Z8249 Family history of ischemic heart disease and other diseases of the circulatory system: Secondary | ICD-10-CM | POA: Insufficient documentation

## 2019-07-04 DIAGNOSIS — Z79899 Other long term (current) drug therapy: Secondary | ICD-10-CM | POA: Insufficient documentation

## 2019-07-04 DIAGNOSIS — I480 Paroxysmal atrial fibrillation: Secondary | ICD-10-CM | POA: Insufficient documentation

## 2019-07-04 DIAGNOSIS — N529 Male erectile dysfunction, unspecified: Secondary | ICD-10-CM | POA: Insufficient documentation

## 2019-07-04 DIAGNOSIS — I4819 Other persistent atrial fibrillation: Secondary | ICD-10-CM | POA: Diagnosis not present

## 2019-07-04 DIAGNOSIS — F329 Major depressive disorder, single episode, unspecified: Secondary | ICD-10-CM | POA: Insufficient documentation

## 2019-07-04 HISTORY — PX: CARDIOVERSION: SHX1299

## 2019-07-04 LAB — PROTIME-INR
INR: 2.9 — ABNORMAL HIGH (ref 0.8–1.2)
Prothrombin Time: 30.7 seconds — ABNORMAL HIGH (ref 11.4–15.2)

## 2019-07-04 SURGERY — CARDIOVERSION
Anesthesia: General

## 2019-07-04 MED ORDER — LIDOCAINE HCL (CARDIAC) PF 100 MG/5ML IV SOSY
PREFILLED_SYRINGE | INTRAVENOUS | Status: DC | PRN
  Administered 2019-07-04: 40 mg via INTRAVENOUS

## 2019-07-04 MED ORDER — SODIUM CHLORIDE 0.9 % IV SOLN: INTRAVENOUS | Status: DC | PRN

## 2019-07-04 MED ORDER — PROPOFOL 10 MG/ML IV BOLUS
INTRAVENOUS | Status: DC | PRN
  Administered 2019-07-04: 140 mg via INTRAVENOUS
  Administered 2019-07-04: 30 mg via INTRAVENOUS

## 2019-07-04 NOTE — Transfer of Care (Signed)
Immediate Anesthesia Transfer of Care Note  Patient: [name]  Procedure(s) Performed: CARDIOVERSION (N/A )  Patient Location: Endoscopy Unit  Anesthesia Type:General  Level of Consciousness: drowsy and patient cooperative  Airway & Oxygen Therapy: Patient Spontanous Breathing and Patient connected to face mask oxygen  Post-op Assessment: Report given to RN, Post -op Vital signs reviewed and stable and Patient moving all extremities X 4  Post vital signs: Reviewed and stable  Last Vitals:  Vitals Value Taken Time  BP 133/65 07/04/19 1016  Temp 36.5 C 07/04/19 1016  Pulse 82 07/04/19 1016  Resp 17 07/04/19 1016  SpO2 100 % 07/04/19 1016    Last Pain:  Vitals:   07/04/19 1016  TempSrc: Temporal  PainSc: 0-No pain         Complications: No apparent anesthesia complications

## 2019-07-04 NOTE — Interval H&P Note (Signed)
History and Physical Interval Note:  07/04/2019 9:55 AM  [name]  has presented today for surgery, with the diagnosis of A-FIB.  The various methods of treatment have been discussed with the patient and family. After consideration of risks, benefits and other options for treatment, the patient has consented to  Procedure(s): CARDIOVERSION (N/A) as a surgical intervention.  The patient's history has been reviewed, patient examined, no change in status, stable for surgery.  I have reviewed the patient's chart and labs.  Questions were answered to the patient's satisfaction.     [name], MD

## 2019-07-04 NOTE — Anesthesia Preprocedure Evaluation (Addendum)
Anesthesia Evaluation  Patient identified by MRN, date of birth, ID band Patient awake    Reviewed: Allergy & Precautions, NPO status , Patient's Chart, lab work & pertinent test results  History of Anesthesia Complications (+) PONV, PROLONGED EMERGENCE and history of anesthetic complications  Airway Mallampati: III  TM Distance: >3 FB Neck ROM: Full    Dental  (+) Dental Advisory Given, Teeth Intact, Chipped   Pulmonary asthma ,    Pulmonary exam normal        Cardiovascular hypertension, Pt. on medications + dysrhythmias Atrial Fibrillation  Rhythm:Irregular Rate:Normal   '20 TTE - EF 55 to 60%. Mildly increased left ventricular hypertrophy. LA was moderately dilated. Trace MR.     Neuro/Psych PSYCHIATRIC DISORDERS Anxiety Depression negative neurological ROS     GI/Hepatic GERD  Medicated and Controlled,(+)     substance abuse  ,  IBS    Endo/Other   Obesity   Renal/GU Renal InsufficiencyRenal disease     Musculoskeletal negative musculoskeletal ROS (+) narcotic dependent  Abdominal   Peds  Hematology negative hematology ROS (+)   Anesthesia Other Findings Covid neg 12/26  Reproductive/Obstetrics                            Anesthesia Physical Anesthesia Plan  ASA: III  Anesthesia Plan: General   Post-op Pain Management:    Induction: Intravenous  PONV Risk Score and Plan: 3 and Treatment may vary due to age or medical condition and Propofol infusion  Airway Management Planned: Mask and Natural Airway  Additional Equipment: None  Intra-op Plan:   Post-operative Plan:   Informed Consent: I have reviewed the patients History and Physical, chart, labs and discussed the procedure including the risks, benefits and alternatives for the proposed anesthesia with the patient or authorized representative who has indicated his/her understanding and acceptance.        Plan Discussed with: CRNA and Anesthesiologist  Anesthesia Plan Comments:        Anesthesia Quick Evaluation

## 2019-07-04 NOTE — Discharge Instructions (Signed)

## 2019-07-04 NOTE — CV Procedure (Signed)
Electrical Cardioversion Procedure Note [name] [id number] 11/09/1955  Procedure: Electrical Cardioversion Indications:  Atrial Fibrillation  Procedure Details Consent: Risks of procedure as well as the alternatives and risks of each were explained to the (patient/caregiver).  Consent for procedure obtained. Time Out: Verified patient identification, verified procedure, site/side was marked, verified correct patient position, special equipment/implants available, medications/allergies/relevent history reviewed, required imaging and test results available.  Performed  Patient placed on cardiac monitor, pulse oximetry, supplemental oxygen as necessary.  Sedation given: propofol Pacer pads placed anterior and posterior chest.  Cardioverted 3 time(s).  Cardioverted at [place], [place], 200J with sternal pressure.  Each attempt was unsuccessful.  Evaluation Findings: Post procedure EKG shows: Atrial Fibrillation Complications: None Patient did tolerate procedure well.   Clinton Latch, MD 07/04/2019, 10:13 AM

## 2019-07-04 NOTE — Anesthesia Postprocedure Evaluation (Signed)
Anesthesia Post Note  Patient: [name]  Procedure(s) Performed: CARDIOVERSION (N/A )     Patient location during evaluation: PACU Anesthesia Type: General Level of consciousness: awake and alert Pain management: pain level controlled Vital Signs Assessment: post-procedure vital signs reviewed and stable Respiratory status: spontaneous breathing, nonlabored ventilation and respiratory function stable Cardiovascular status: blood pressure returned to baseline and stable Postop Assessment: no apparent nausea or vomiting Anesthetic complications: no    Last Vitals:  Vitals:   07/04/19 1030 07/04/19 1040  BP: 131/66 127/73  Pulse: 79 77  Resp: 14 12  Temp:    SpO2: 99% 98%    Last Pain:  Vitals:   07/04/19 1040  TempSrc:   PainSc: 0-No pain                 Audry Pili

## 2019-07-05 ENCOUNTER — Telehealth: Payer: Self-pay

## 2019-07-05 NOTE — Telephone Encounter (Signed)
Spoke with patient. Informed patient Dr. Stanford Breed wants to follow up with patient with a office visit due to failed cardioversion. Patient scheduled for January 14th at 3pm.

## 2019-07-11 ENCOUNTER — Ambulatory Visit: Payer: 59 | Attending: Internal Medicine

## 2019-07-11 DIAGNOSIS — Z20822 Contact with and (suspected) exposure to covid-19: Secondary | ICD-10-CM

## 2019-07-12 ENCOUNTER — Telehealth: Payer: Self-pay | Admitting: Neurology

## 2019-07-12 LAB — NOVEL CORONAVIRUS, NAA: SARS-CoV-2, NAA: NOT DETECTED

## 2019-07-12 NOTE — Telephone Encounter (Signed)
Called patient to see if he had been called for PSY visit .

## 2019-07-13 ENCOUNTER — Telehealth (INDEPENDENT_AMBULATORY_CARE_PROVIDER_SITE_OTHER): Payer: 59 | Admitting: Internal Medicine

## 2019-07-13 ENCOUNTER — Other Ambulatory Visit: Payer: Self-pay

## 2019-07-13 ENCOUNTER — Ambulatory Visit (INDEPENDENT_AMBULATORY_CARE_PROVIDER_SITE_OTHER): Payer: 59 | Admitting: Pharmacist

## 2019-07-13 ENCOUNTER — Telehealth: Payer: Self-pay

## 2019-07-13 ENCOUNTER — Encounter: Payer: Self-pay | Admitting: Internal Medicine

## 2019-07-13 VITALS — BP 140/89 | HR 82 | Ht 77.0 in | Wt 290.0 lb

## 2019-07-13 DIAGNOSIS — I10 Essential (primary) hypertension: Secondary | ICD-10-CM | POA: Diagnosis not present

## 2019-07-13 DIAGNOSIS — I480 Paroxysmal atrial fibrillation: Secondary | ICD-10-CM

## 2019-07-13 DIAGNOSIS — I4819 Other persistent atrial fibrillation: Secondary | ICD-10-CM

## 2019-07-13 DIAGNOSIS — I4891 Unspecified atrial fibrillation: Secondary | ICD-10-CM

## 2019-07-13 DIAGNOSIS — I340 Nonrheumatic mitral (valve) insufficiency: Secondary | ICD-10-CM

## 2019-07-13 DIAGNOSIS — Z7901 Long term (current) use of anticoagulants: Secondary | ICD-10-CM | POA: Diagnosis not present

## 2019-07-13 LAB — POCT INR: INR: 4 — AB (ref 2.0–3.0)

## 2019-07-13 NOTE — Telephone Encounter (Signed)
-----   Message from Thompson Grayer, MD sent at 07/13/2019  2:38 PM EST ----- Refer to Dr Roxy Manns for MAZE and possible mitral valve repair

## 2019-07-13 NOTE — Telephone Encounter (Signed)
Referral placed.  Pt notified via mychart.

## 2019-07-13 NOTE — Progress Notes (Signed)
Electrophysiology TeleHealth Note   Due to national recommendations of social distancing due to COVID 19, an audio/video telehealth visit is felt to be most appropriate for this patient at this time.  See MyChart message from today for the patient's consent to telehealth for [facility].  Date:  07/13/2019   ID:  [name], DOB [birth year], MRN [id number]  Location: patient's home  Provider location:  [facility]  Evaluation Performed: Follow-up visit  PCP:  [name], MD   Electrophysiologist:  Dr Rayann Heman  Chief Complaint:  palpitations  History of Present Illness:    [name] is a 64 y.o. male who presents via telehealth conferencing today.  Since last being seen in our clinic, the patient reports doing reasonably well. He recently failed cardioversion.  He is symptomatic with his afib. + SOB with moderate activity.  He is frustrated with recent inability to maintain sinus.  Today, he denies symptoms of palpitations, chest pain,   lower extremity edema, dizziness, presyncope, or syncope.  The patient is otherwise without complaint today.  The patient denies symptoms of fevers, chills, cough, or new SOB worrisome for COVID 19.  Past Medical History:  Diagnosis Date  . Allergic rhinitis   . Anxiety   . Asthma    as a child  . Chronic low back pain   . Complication of anesthesia    "hard to put asleep, hard to wake up, nausea and vomiting"  . Depression   . ED (erectile dysfunction)   . GERD (gastroesophageal reflux disease)   . HLD (hyperlipidemia)   . HTN (hypertension)    sees Dr. [name], [facility]  . Hypertrophy of prostate with urinary obstruction and other lower urinary tract symptoms (LUTS)   . IBS (irritable bowel syndrome)   . Inguinal hernia without mention of obstruction or gangrene, unilateral or unspecified, (not specified as recurrent)   . Insomnia   . Lumbar herniated disc    L7  . Morbid obesity ([place])   . Narcotic  addiction ([place])   . NICM (nonischemic cardiomyopathy) (HCC)    tachycardia mediated,  resolved with sinus  . Persistent atrial fibrillation ([place])    ablation done 03/2006 at [facility]  . Pneumonia    hx of 2004  . PONV (postoperative nausea and vomiting)   . Twitching    legs    Past Surgical History:  Procedure Laterality Date  . ATRIAL ABLATION SURGERY  2007   [facility]  . CARDIOVERSION  08/11/2011   Procedure: CARDIOVERSION;  Surgeon: Clinton Perla, MD;  Location: [place];  Service: Cardiovascular;  Laterality: N/A;  . CARDIOVERSION N/A 04/16/2017   Procedure: CARDIOVERSION;  Surgeon: Clinton Klein, MD;  [facility] ENDOSCOPY;  Service: Cardiovascular;  Laterality: N/A;  . CARDIOVERSION N/A 03/04/2019   Procedure: CARDIOVERSION;  Surgeon: Clinton Perla, MD;  Location: [facility] ENDOSCOPY;  Service: Cardiovascular;  Laterality: N/A;  . CARDIOVERSION N/A 07/04/2019   Procedure: CARDIOVERSION;  Surgeon: Clinton Latch, MD;  Location: [facility] ENDOSCOPY;  Service: Cardiovascular;  Laterality: N/A;  . EYE SURGERY     lasik, 1998  . HERNIA REPAIR     1977  . INGUINAL HERNIA REPAIR Right 08/26/2012   Procedure: LAPAROSCOPIC INGUINAL HERNIA;  Surgeon: Clinton Hook, DO;  Location: [place];  Service: General;  Laterality: Right;  laparoscopic right inguinal hernia repair with mesh, umbilical hernia repair  . INSERTION OF MESH Right 08/26/2012   Procedure: INSERTION OF MESH;  Surgeon: [name]  Clinton Punt, DO;  Location: [place] OR;  Service: General;  Laterality: Right;  right inguinal hernia  . TEE WITHOUT CARDIOVERSION N/A 03/04/2019   Procedure: TRANSESOPHAGEAL ECHOCARDIOGRAM (TEE);  Surgeon: Clinton Perla, MD;  Location: [place];  Service: Cardiovascular;  Laterality: N/A;  . UMBILICAL HERNIA REPAIR N/A 08/26/2012   Procedure: HERNIA REPAIR UMBILICAL ADULT;  Surgeon: Clinton Hook, DO;  Location: [place];  Service: General;  Laterality: N/A;    Current Outpatient Medications  Medication Sig Dispense Refill   . acetaminophen (TYLENOL) 325 MG tablet Take 650 mg by mouth every 6 (six) hours as needed for moderate pain or headache.     . ALPRAZolam (XANAX) 0.5 MG tablet TAKE 1 TABLET(0.5 MG) BY MOUTH AT BEDTIME AS NEEDED FOR ANXIETY (Patient taking differently: Take 0.5 mg by mouth at bedtime as needed for anxiety or sleep. ) 30 tablet 0  . amiodarone (PACERONE) 200 MG tablet Take 1 tablet (200 mg total) by mouth daily. 30 tablet 3  . diltiazem (CARDIZEM CD) 360 MG 24 hr capsule Take 1 capsule (360 mg total) by mouth daily. 90 capsule 3  . DULoxetine (CYMBALTA) 60 MG capsule Take 120 mg by mouth every evening.     . furosemide (LASIX) 40 MG tablet Take 1 tablet (40 mg total) by mouth 2 (two) times daily. 60 tablet 5  . gabapentin (NEURONTIN) 300 MG capsule Take 600-900 mg by mouth daily as needed (pain).     Marland Kitchen ibuprofen (ADVIL) 200 MG tablet Take 400 mg by mouth every 6 (six) hours as needed (body aches).    . lamoTRIgine (LAMICTAL) 25 MG tablet Take 1 tablet (25 mg total) by mouth 2 (two) times daily. 60 tablet 3  . lisinopril (ZESTRIL) 40 MG tablet Take 1 tablet (40 mg total) by mouth daily. 30 tablet 6  . methocarbamol (ROBAXIN) 750 MG tablet Take 750 mg by mouth at bedtime as needed for muscle spasms.    . metoprolol succinate (TOPROL XL) 25 MG 24 hr tablet Take 2 tablets (50 mg total) by mouth daily. 180 tablet 3  . montelukast (SINGULAIR) 10 MG tablet Take 10 mg by mouth at bedtime as needed (allergies).     Marland Kitchen omeprazole (PRILOSEC) 20 MG capsule Take 20 mg by mouth daily as needed (acid reflux/indigestion.).     Marland Kitchen potassium chloride SA (K-DUR) 20 MEQ tablet Take 1 tablet daily for 4 days, then only take on days that you need to take a lasix tablet. 30 tablet 5  . propranolol (INDERAL) 20 MG tablet Take 20 mg by mouth 2 (two) times daily as needed (anxiety).     Marland Kitchen rOPINIRole (REQUIP) 1 MG tablet Take 1-2 mg by mouth at bedtime as needed (restless leg syndrome). 1-3 hours prior to bedtime     .  sildenafil (REVATIO) 20 MG tablet Take 40-100 mg by mouth daily as needed (ED).     . traMADol (ULTRAM) 50 MG tablet Take 50-100 mg by mouth at bedtime as needed for pain.    Marland Kitchen warfarin (COUMADIN) 5 MG tablet Take 1 tablet (5 mg total) by mouth See admin instructions. Take 7.5 mg in the evening on Mon, Wed, and Fri. Take 5 mg in the evening on Tue, Thurs, Sat, and Sun 60 tablet 0   No current facility-administered medications for this visit.    Allergies:   Hydrocodone   Social History:  The patient  reports that he has never smoked. He has never used smokeless tobacco. He reports  that he does not drink alcohol or use drugs.   Family History:  The patient's family history includes Asthma in his mother; Breast cancer in his mother; COPD in his father; Hypertension in his father and mother; Lymphoma in his sister; Prostate cancer in his father.   ROS:  Please see the history of present illness.   All other systems are personally reviewed and negative.    Exam:    Vital Signs:  BP 140/89   Pulse 82   Ht 6\' 5"  (1.956 m)   Wt 290 lb (131.5 kg)   BMI 34.39 kg/m   Well sounding, alert and conversant   Labs/Other Tests and Data Reviewed:    Recent Labs: 11/17/2018: B Natriuretic Peptide 35.7 03/03/2019: Hemoglobin 10.9; Magnesium 1.9; Platelets 320 05/02/2019: ALT 50; TSH 1.256 06/27/2019: BUN 25; Creatinine, Ser 1.60; Potassium 4.3; Sodium 141   Wt Readings from Last 3 Encounters:  07/13/19 290 lb (131.5 kg)  06/27/19 (!) 313 lb (142 kg)  06/16/19 (!) 316 lb (143.3 kg)    ASSESSMENT & PLAN:    1.  Persistent afib The patient has persistent afib.  He has failed catheter ablation as well as multiple AADs. s/p PVI at [facility] in 2007.  He has severe LA enlargement.  He has had several prior echos showing moderate to severe MR, though recent echo was without as much MR. I do not believe that he will have success with PVI.  I would advise surgical MAZE.  He would also benefit from LAA  removal. Given prior mitral regurgitation on TEE, I will refer to Dr Roxy Manns for consideration of possible mitral valve repair as well.  2. Morbid obesity Weight loss was discussed at length today.  3. HTN Stable No change required today   Patient Risk:  after full review of this patients clinical status, I feel that they are at moderate risk at this time.  Today, I have spent 35 minutes with the patient with telehealth technology discussing arrhythmia management .    SignedThompson Grayer, MD  07/13/2019 2:22 PM     [address] [place] [id number] [phone] (office) [phone] (fax)

## 2019-07-14 MED ORDER — AMIODARONE HCL 200 MG PO TABS: 200.0000 mg | ORAL_TABLET | Freq: Every day | ORAL | 3 refills | Status: DC

## 2019-07-14 NOTE — Telephone Encounter (Signed)
Called patient x 2  

## 2019-07-14 NOTE — Telephone Encounter (Signed)
Patient has apt with Dr. Enis Gash next week.

## 2019-07-18 NOTE — Progress Notes (Signed)
HPI: FU paroxysmal atrial fibrillation. The patient had a cardiac catheterization in 2004 that showed an ejection fraction of 45% and normal coronary arteries. He did have atrial fibrillation at that time and his LV function improved after sinus rhythm was restored. He ultimately had atrial fibrillation ablation. He did well for several yearsbut then recurrent atrial fibrillation. Transesophageal echocardiogram August 2020 showed normal LV function, severe left atrial enlargement, moderate to severe mitral regurgitation and mild tricuspid regurgitation.Patient had cardioversion March 04, 2019 but atrial fibrillation recurred. Patient seen by Dr. Rayann Heman and repeat ablation felt not indicated as chances of maintaining sinus would be lower given obesity and severe left atrial enlargement. Plan was to potentially consider referral for surgical Maze procedure and mitral valve repair. Follow-up transthoracic echocardiogram October 2020 showed normal LV function, moderate left atrial enlargement and trace mitral regurgitation. Patient subsequently placed on amiodarone in the atrial fibrillation clinic. Also referred for evaluation of sleep apnea.    Repeat attempt at cardioversion December 2020 unsuccessful.  He saw Dr. Rayann Heman in follow-up and plan is CVTS referral for consideration of surgical maze.  Since last seen,patient has some dyspnea on exertion and fatigue.  No chest pain, palpitations or syncope.  Current Outpatient Medications  Medication Sig Dispense Refill  . acetaminophen (TYLENOL) 325 MG tablet Take 650 mg by mouth every 6 (six) hours as needed for moderate pain or headache.     . ALPRAZolam (XANAX) 0.5 MG tablet TAKE 1 TABLET(0.5 MG) BY MOUTH AT BEDTIME AS NEEDED FOR ANXIETY (Patient taking differently: Take 0.5 mg by mouth at bedtime as needed for anxiety or sleep. ) 30 tablet 0  . amiodarone (PACERONE) 200 MG tablet Take 1 tablet (200 mg total) by mouth daily. 30 tablet 3  .  diltiazem (CARDIZEM CD) 360 MG 24 hr capsule Take 1 capsule (360 mg total) by mouth daily. 90 capsule 3  . DULoxetine (CYMBALTA) 60 MG capsule Take 120 mg by mouth every evening.     . furosemide (LASIX) 40 MG tablet Take 1 tablet (40 mg total) by mouth 2 (two) times daily. 60 tablet 5  . gabapentin (NEURONTIN) 300 MG capsule Take 600-900 mg by mouth daily as needed (pain).     Marland Kitchen ibuprofen (ADVIL) 200 MG tablet Take 400 mg by mouth every 6 (six) hours as needed (body aches).    . lamoTRIgine (LAMICTAL) 25 MG tablet Take 1 tablet (25 mg total) by mouth 2 (two) times daily. 60 tablet 3  . lisinopril (ZESTRIL) 40 MG tablet Take 1 tablet (40 mg total) by mouth daily. 30 tablet 6  . methocarbamol (ROBAXIN) 750 MG tablet Take 750 mg by mouth at bedtime as needed for muscle spasms.    . metoprolol succinate (TOPROL XL) 25 MG 24 hr tablet Take 2 tablets (50 mg total) by mouth daily. 180 tablet 3  . montelukast (SINGULAIR) 10 MG tablet Take 10 mg by mouth at bedtime as needed (allergies).     Marland Kitchen omeprazole (PRILOSEC) 20 MG capsule Take 20 mg by mouth daily as needed (acid reflux/indigestion.).     Marland Kitchen potassium chloride SA (K-DUR) 20 MEQ tablet Take 1 tablet daily for 4 days, then only take on days that you need to take a lasix tablet. 30 tablet 5  . propranolol (INDERAL) 20 MG tablet Take 20 mg by mouth 2 (two) times daily as needed (anxiety).     Marland Kitchen rOPINIRole (REQUIP) 1 MG tablet Take 1-2 mg by mouth at bedtime  as needed (restless leg syndrome). 1-3 hours prior to bedtime     . sildenafil (REVATIO) 20 MG tablet Take 40-100 mg by mouth daily as needed (ED).     . traMADol (ULTRAM) 50 MG tablet Take 50-100 mg by mouth at bedtime as needed for pain.    Marland Kitchen warfarin (COUMADIN) 5 MG tablet Take 1 tablet (5 mg total) by mouth See admin instructions. Take 7.5 mg in the evening on Mon, Wed, and Fri. Take 5 mg in the evening on Tue, Thurs, Sat, and Sun 60 tablet 0   No current facility-administered medications for this  visit.     Past Medical History:  Diagnosis Date  . Allergic rhinitis   . Anxiety   . Asthma    as a child  . Chronic low back pain   . Complication of anesthesia    "hard to put asleep, hard to wake up, nausea and vomiting"  . Depression   . ED (erectile dysfunction)   . GERD (gastroesophageal reflux disease)   . HLD (hyperlipidemia)   . HTN (hypertension)    sees Dr. [name], [facility]  . Hypertrophy of prostate with urinary obstruction and other lower urinary tract symptoms (LUTS)   . IBS (irritable bowel syndrome)   . Inguinal hernia without mention of obstruction or gangrene, unilateral or unspecified, (not specified as recurrent)   . Insomnia   . Lumbar herniated disc    L7  . Morbid obesity ([place])   . Narcotic addiction ([place])   . NICM (nonischemic cardiomyopathy) (HCC)    tachycardia mediated,  resolved with sinus  . Persistent atrial fibrillation ([place])    ablation done 03/2006 at [facility]  . Pneumonia    hx of 2004  . PONV (postoperative nausea and vomiting)   . Twitching    legs    Past Surgical History:  Procedure Laterality Date  . ATRIAL ABLATION SURGERY  2007   [facility]  . CARDIOVERSION  08/11/2011   Procedure: CARDIOVERSION;  Surgeon: Lelon Perla, MD;  Location: [place];  Service: Cardiovascular;  Laterality: N/A;  . CARDIOVERSION N/A 04/16/2017   Procedure: CARDIOVERSION;  Surgeon: Sanda Klein, MD;  Location: [facility] ENDOSCOPY;  Service: Cardiovascular;  Laterality: N/A;  . CARDIOVERSION N/A 03/04/2019   Procedure: CARDIOVERSION;  Surgeon: Lelon Perla, MD;  Location: [facility] ENDOSCOPY;  Service: Cardiovascular;  Laterality: N/A;  . CARDIOVERSION N/A 07/04/2019   Procedure: CARDIOVERSION;  Surgeon: [name], MD;  Location: [facility] ENDOSCOPY;  Service: Cardiovascular;  Laterality: N/A;  . EYE SURGERY     lasik, 1998  . HERNIA REPAIR     1977  . INGUINAL HERNIA REPAIR Right 08/26/2012   Procedure: LAPAROSCOPIC INGUINAL HERNIA;  Surgeon: Madilyn Hook, DO;  Location: [place];  Service: General;  Laterality: Right;  laparoscopic right inguinal hernia repair with mesh, umbilical hernia repair  . INSERTION OF MESH Right 08/26/2012   Procedure: INSERTION OF MESH;  Surgeon: Madilyn Hook, DO;  Location: [place];  Service: General;  Laterality: Right;  right inguinal hernia  . TEE WITHOUT CARDIOVERSION N/A 03/04/2019   Procedure: TRANSESOPHAGEAL ECHOCARDIOGRAM (TEE);  Surgeon: Lelon Perla, MD;  Location: [place];  Service: Cardiovascular;  Laterality: N/A;  . UMBILICAL HERNIA REPAIR N/A 08/26/2012   Procedure: HERNIA REPAIR UMBILICAL ADULT;  Surgeon: Madilyn Hook, DO;  Location: [place];  Service: General;  Laterality: N/A;    Social History   Socioeconomic History  . Marital status: Married    Spouse  name: Not on file  . Number of children: 3  . Years of education: Not on file  . Highest education level: Not on file  Occupational History  . 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 Smoking status: Never Smoker  . Smokeless tobacco: Never Used  Substance and Sexual Activity  . Alcohol use: No    Alcohol/week: 0.0 standard drinks  . Drug use: No  . Sexual activity: Yes  Other Topics Concern  . Not on file  Social History Narrative   Lives in [place] Determinants of Health   Financial Resource Strain:   . Difficulty of Paying Living Expenses: Not on file  Food Insecurity:   . Worried About Charity fundraiser in the Last Year: Not on file  . Ran Out of Food in the Last Year: Not on file  Transportation Needs:   . Lack of Transportation (Medical): Not on file  . Lack of Transportation (Non-Medical): Not on file  Physical Activity:   . Days of Exercise per Week: Not on file  . Minutes of Exercise per Session: Not on file  Stress:   . Feeling of Stress : Not on file  Social Connections:   . Frequency of Communication with Friends and Family: Not on file  . Frequency of Social Gatherings with  Friends and Family: Not on file  . Attends Religious Services: Not on file  . Active Member of Clubs or Organizations: Not on file  . Attends Archivist Meetings: Not on file  . Marital Status: Not on file  Intimate Partner Violence:   . Fear of Current or Ex-Partner: Not on file  . Emotionally Abused: Not on file  . Physically Abused: Not on file  . Sexually Abused: Not on file    Family History  Problem Relation Age of Onset  . Asthma Mother   . Breast cancer Mother   . Hypertension Mother   . COPD Father   . Prostate cancer Father   . Hypertension Father   . Lymphoma Sister     ROS: no fevers or chills, productive cough, hemoptysis, dysphasia, odynophagia, melena, hematochezia, dysuria, hematuria, rash, seizure activity, orthopnea, PND, pedal edema, claudication. Remaining systems are negative.  Physical Exam: Well-developed well-nourished in no acute distress.  Skin is warm and dry.  HEENT is normal.  Neck is supple.  Chest is clear to auscultation with normal expansion.  Cardiovascular exam is irregular Abdominal exam nontender or distended. No masses palpated. Extremities show no edema. neuro grossly intact  ECG-atrial fibrillation at a rate of 84, incomplete right bundle branch block.  Personally reviewed  A/P  1 persistent atrial fibrillation-patient remains in atrial fibrillation today.  Recent attempt at repeat cardioversion on amiodarone unsuccessful. He is symptomatic.  Plan is for referral to CVTS for consideration of surgical maze.  Continue Coumadin.  Patient would prefer apixaban.  We will review because and change to 5 mg twice daily if able.  Continue present medications for rate control.  I will also continue amiodarone as he will likely need this after his surgical procedure to maintain sinus rhythm at least initially.  2 history of mitral regurgitation-appear to be 3+ on previous transesophageal echocardiogram.  Follow-up transthoracic showed  trace mitral regurgitation but may have not been as well visualized.  Will review with Dr. Roxy Manns prior to surgery.  If surgery is pursued he will need cardiac catheterization prior to procedure.  3 History of cardiomyopathy-this was previously  felt to be tachycardia mediated.  His LV function is normal on most recent studies.  4 hypertension-patient's blood pressure is controlled.  Continue present medications and follow.  5 chronic diastolic congestive heart failure-exacerbated by atrial arrhythmias.  Continue Lasix at present dose.  6 obstructive sleep apnea/obesity-needs weight loss.  Needs further evaluation for sleep apnea.  Kirk Ruths, MD

## 2019-07-20 ENCOUNTER — Encounter: Payer: 59 | Admitting: Thoracic Surgery (Cardiothoracic Vascular Surgery)

## 2019-07-21 ENCOUNTER — Ambulatory Visit (INDEPENDENT_AMBULATORY_CARE_PROVIDER_SITE_OTHER): Payer: 59 | Admitting: Cardiology

## 2019-07-21 ENCOUNTER — Other Ambulatory Visit: Payer: Self-pay

## 2019-07-21 ENCOUNTER — Ambulatory Visit (INDEPENDENT_AMBULATORY_CARE_PROVIDER_SITE_OTHER): Payer: 59 | Admitting: Pharmacist

## 2019-07-21 ENCOUNTER — Encounter: Payer: Self-pay | Admitting: Cardiology

## 2019-07-21 VITALS — BP 126/84 | HR 84 | Temp 98.1°F | Ht 74.0 in | Wt 313.0 lb

## 2019-07-21 DIAGNOSIS — Z7901 Long term (current) use of anticoagulants: Secondary | ICD-10-CM

## 2019-07-21 DIAGNOSIS — I4819 Other persistent atrial fibrillation: Secondary | ICD-10-CM | POA: Diagnosis not present

## 2019-07-21 DIAGNOSIS — I480 Paroxysmal atrial fibrillation: Secondary | ICD-10-CM

## 2019-07-21 LAB — POCT INR: INR: 3.8 — AB (ref 2.0–3.0)

## 2019-07-21 MED ORDER — APIXABAN 5 MG PO TABS: 5.0000 mg | ORAL_TABLET | Freq: Two times a day (BID) | ORAL | 11 refills | Status: DC

## 2019-07-21 NOTE — Patient Instructions (Signed)
STOP taking warfarin today, then start taking Eliquis 5mg  twice daily*

## 2019-07-21 NOTE — Patient Instructions (Signed)
Medication Instructions:  CHANGE TO ELIQUIS 5 MG TWICE DAILY *If you need a refill on your cardiac medications before your next appointment, please call your pharmacy*  Lab Work: If you have labs (blood work) drawn today and your tests are completely normal, you will receive your results only by: Marland Kitchen MyChart Message (if you have MyChart) OR . A paper copy in the mail If you have any lab test that is abnormal or we need to change your treatment, we will call you to review the results.  Follow-Up: At [facility], you and your health needs are our priority.  As part of our continuing mission to provide you with exceptional heart care, we have created designated Provider Care Teams.  These Care Teams include your primary Cardiologist (physician) and Advanced Practice Providers (APPs -  Physician Assistants and Nurse Practitioners) who all work together to provide you with the care you need, when you need it.  Your next appointment:   3 month(s)  The format for your next appointment:   Either In Person or Virtual  Provider:   You may see Kirk Ruths, MD or one of the following Advanced Practice Providers on your designated Care Team:    Kerin Ransom, PA-C  [place], [place]  Coletta Memos, [place]

## 2019-07-22 ENCOUNTER — Encounter: Payer: Self-pay | Admitting: Thoracic Surgery (Cardiothoracic Vascular Surgery)

## 2019-07-22 ENCOUNTER — Institutional Professional Consult (permissible substitution): Payer: 59 | Admitting: Thoracic Surgery (Cardiothoracic Vascular Surgery)

## 2019-07-22 VITALS — BP 149/90 | HR 90 | Temp 97.7°F | Resp 20 | Ht 77.0 in | Wt 298.0 lb

## 2019-07-22 DIAGNOSIS — I340 Nonrheumatic mitral (valve) insufficiency: Secondary | ICD-10-CM

## 2019-07-22 DIAGNOSIS — I4819 Other persistent atrial fibrillation: Secondary | ICD-10-CM | POA: Diagnosis not present

## 2019-07-22 DIAGNOSIS — I480 Paroxysmal atrial fibrillation: Secondary | ICD-10-CM | POA: Diagnosis not present

## 2019-07-22 NOTE — Patient Instructions (Signed)
Continue all previous medications without any changes at this time  

## 2019-07-22 NOTE — Progress Notes (Addendum)
[address]       [place] [address]             [place] REPORT  Referring Provider is Thompson Grayer, MD Primary Cardiologist is Kirk Ruths, MD PCP is [name], MD  Chief Complaint  Patient presents with  . Mitral Regurgitation    Surgical eval, ECHO 04/25/19, CTA Chest  11/17/18, TEE 03/04/19  . Atrial Fibrillation    HPI:  Patient is a 64 year old male with a longstanding history of atrial fibrillation that has failed previous catheter-based ablation and multiple DC cardioversions on multiple different antiarrhythmic drugs, mitral regurgitation, hypertension, GE reflux disease, hyperlipidemia, anxiety and depression who has been referred for surgical consultation to discuss possible surgical treatment of recurrent persistent atrial fibrillation and mitral regurgitation.  Patient's cardiac history dates back more than 15 years ago when he first developed paroxysmal atrial fibrillation.  He has been followed intermittently ever since and underwent catheter-based ablation at [facility] in 2007.  He initially did well and maintained sinus rhythm until 2013.  Since then he has undergone multiple cardioversions on multiple different antiarrhythmic drugs.  Initially he was treated with Tikosyn, following that flecainide, and most recently amiodarone.  For several years he did well following cardioversions, but last July he underwent cardioversion and went back into atrial fibrillation in less than a month.  TEE performed at the time of his cardioversion revealed normal left ventricular systolic function with severe left atrial enlargement and moderate to severe mitral regurgitation.  He was seen in consultation by Dr. Rayann Heman at that time and felt to be relatively poor candidate for repeat catheter-based ablation because of obesity, severe left atrial enlargement, and mitral regurgitation.  More  recently he was loaded with amiodarone and despite that he could not be cardioverted back into sinus rhythm following 3 attempts in December 2020.  Most recent follow-up transthoracic echocardiogram revealed normal left ventricular systolic function but reportedly only trace mitral regurgitation.  Cardiothoracic surgical consultation was requested to consider possible surgical Maze procedure and mitral valve repair.  Patient is chronically anticoagulated using Eliquis.  He has not had any bleeding complications nor history of stroke.  Patient is married and lives locally in [place] with his wife.  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  He does not exercise on a regular basis but he does walk with his wife on nearly a daily basis.  He reports no particular physical limitations other than problems with exertional shortness of breath and some chronic back pain.  He states that he does not experience palpitations but he can tell immediately when he goes out of sinus rhythm into atrial fibrillation with an immediate significant drop in his exercise tolerance and increased exertional shortness of breath.  He denies any history of resting shortness of breath or PND.  He does not get chest pain or chest tightness either with activity or at rest.  He denies lower extremity edema.  He does state that he gets short of breath laying flat in bed occasionally.  Patient does admit that he had gained over 40 pounds over the past year or so but he has recently resumed a diet and has successfully lost approximately 15 pounds over the past 3 months with intention to lose another 30-50 pounds.    Past Medical History:  Diagnosis Date  . Allergic rhinitis   .  Anxiety   . Asthma    as a child  . Chronic low back pain   . Complication of anesthesia    "hard to put asleep, hard to wake up, nausea and vomiting"  . Depression   . ED (erectile dysfunction)   . GERD (gastroesophageal  reflux disease)   . HLD (hyperlipidemia)   . HTN (hypertension)    sees Dr. [name], [facility]  . Hypertrophy of prostate with urinary obstruction and other lower urinary tract symptoms (LUTS)   . IBS (irritable bowel syndrome)   . Inguinal hernia without mention of obstruction or gangrene, unilateral or unspecified, (not specified as recurrent)   . Insomnia   . Lumbar herniated disc    L7  . Morbid obesity ([place])   . Narcotic addiction ([place])   . NICM (nonischemic cardiomyopathy) ([facility])    tachycardia mediated,  resolved with sinus  . Persistent atrial fibrillation ([place])    ablation done 03/2006 at [facility]  . Pneumonia    hx of 2004  . PONV (postoperative nausea and vomiting)   . Twitching    legs    Past Surgical History:  Procedure Laterality Date  . ATRIAL ABLATION SURGERY  2007   [facility]  . CARDIOVERSION  08/11/2011   Procedure: CARDIOVERSION;  Surgeon: Lelon Perla, MD;  Location: [place];  Service: Cardiovascular;  Laterality: N/A;  . CARDIOVERSION N/A 04/16/2017   Procedure: CARDIOVERSION;  Surgeon: Sanda Klein, MD;  Location: [facility] ENDOSCOPY;  Service: Cardiovascular;  Laterality: N/A;  . CARDIOVERSION N/A 03/04/2019   Procedure: CARDIOVERSION;  Surgeon: Lelon Perla, MD;  Location: [facility] ENDOSCOPY;  Service: Cardiovascular;  Laterality: N/A;  . CARDIOVERSION N/A 07/04/2019   Procedure: CARDIOVERSION;  Surgeon: [name], MD;  Location: [facility] ENDOSCOPY;  Service: Cardiovascular;  Laterality: N/A;  . EYE SURGERY     lasik, 1998  . HERNIA REPAIR     1977  . INGUINAL HERNIA REPAIR Right 08/26/2012   Procedure: LAPAROSCOPIC INGUINAL HERNIA;  Surgeon: Madilyn Hook, DO;  Location: [place];  Service: General;  Laterality: Right;  laparoscopic right inguinal hernia repair with mesh, umbilical hernia repair  . INSERTION OF MESH Right 08/26/2012   Procedure: INSERTION OF MESH;  Surgeon: Madilyn Hook, DO;  Location: [place];  Service: General;  Laterality: Right;  right  inguinal hernia  . TEE WITHOUT CARDIOVERSION N/A 03/04/2019   Procedure: TRANSESOPHAGEAL ECHOCARDIOGRAM (TEE);  Surgeon: Lelon Perla, MD;  Location: [place];  Service: Cardiovascular;  Laterality: N/A;  . UMBILICAL HERNIA REPAIR N/A 08/26/2012   Procedure: HERNIA REPAIR UMBILICAL ADULT;  Surgeon: Madilyn Hook, DO;  Location: [facility] OR;  Service: General;  Laterality: N/A;    Family History  Problem Relation Age of Onset  . Asthma Mother   . Breast cancer Mother   . Hypertension Mother   . COPD Father   . Prostate cancer Father   . Hypertension Father   . Lymphoma Sister     Social History   Socioeconomic History  . Marital status: Married    Spouse name: Not on file  . Number of children: 3  . Years of education: Not on file  . Highest education level: Not on file  Occupational History  . 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 Smoking status: Never Smoker  . Smokeless tobacco: Never Used  Substance and Sexual Activity  . Alcohol use: No    Alcohol/week: 0.0 standard drinks  . Drug use: No  .  Sexual activity: Yes  Other Topics Concern  . Not on file  Social History Narrative   Lives in [place] Determinants of Health   Financial Resource Strain:   . Difficulty of Paying Living Expenses: Not on file  Food Insecurity:   . Worried About Charity fundraiser in the Last Year: Not on file  . Ran Out of Food in the Last Year: Not on file  Transportation Needs:   . Lack of Transportation (Medical): Not on file  . Lack of Transportation (Non-Medical): Not on file  Physical Activity:   . Days of Exercise per Week: Not on file  . Minutes of Exercise per Session: Not on file  Stress:   . Feeling of Stress : Not on file  Social Connections:   . Frequency of Communication with Friends and Family: Not on file  . Frequency of Social Gatherings with Friends and Family: Not on file  . Attends Religious Services: Not on file  . Active Member of  Clubs or Organizations: Not on file  . Attends Archivist Meetings: Not on file  . Marital Status: Not on file  Intimate Partner Violence:   . Fear of Current or Ex-Partner: Not on file  . Emotionally Abused: Not on file  . Physically Abused: Not on file  . Sexually Abused: Not on file    Current Outpatient Medications  Medication Sig Dispense Refill  . acetaminophen (TYLENOL) 325 MG tablet Take 650 mg by mouth every 6 (six) hours as needed for moderate pain or headache.     . ALPRAZolam (XANAX) 0.5 MG tablet TAKE 1 TABLET(0.5 MG) BY MOUTH AT BEDTIME AS NEEDED FOR ANXIETY (Patient taking differently: Take 0.5 mg by mouth at bedtime as needed for anxiety or sleep. ) 30 tablet 0  . amiodarone (PACERONE) 200 MG tablet Take 1 tablet (200 mg total) by mouth daily. 30 tablet 3  . apixaban (ELIQUIS) 5 MG TABS tablet Take 1 tablet (5 mg total) by mouth 2 (two) times daily. 60 tablet 11  . diltiazem (CARDIZEM CD) 360 MG 24 hr capsule Take 1 capsule (360 mg total) by mouth daily. 90 capsule 3  . DULoxetine (CYMBALTA) 60 MG capsule Take 120 mg by mouth every evening.     . furosemide (LASIX) 40 MG tablet Take 1 tablet (40 mg total) by mouth 2 (two) times daily. 60 tablet 5  . lamoTRIgine (LAMICTAL) 25 MG tablet Take 1 tablet (25 mg total) by mouth 2 (two) times daily. 60 tablet 3  . lisinopril (ZESTRIL) 40 MG tablet Take 1 tablet (40 mg total) by mouth daily. 30 tablet 6  . metoprolol succinate (TOPROL XL) 25 MG 24 hr tablet Take 2 tablets (50 mg total) by mouth daily. 180 tablet 3  . omeprazole (PRILOSEC) 20 MG capsule Take 20 mg by mouth daily as needed (acid reflux/indigestion.).     Marland Kitchen potassium chloride SA (K-DUR) 20 MEQ tablet Take 1 tablet daily for 4 days, then only take on days that you need to take a lasix tablet. 30 tablet 5  . rOPINIRole (REQUIP) 1 MG tablet Take 1-2 mg by mouth at bedtime as needed (restless leg syndrome). 1-3 hours prior to bedtime     . sildenafil (REVATIO) 20  MG tablet Take 40-100 mg by mouth daily as needed (ED).     . traMADol (ULTRAM) 50 MG tablet Take 50-100 mg by mouth at bedtime as needed for pain.    Marland Kitchen ibuprofen (  ADVIL) 200 MG tablet Take 400 mg by mouth every 6 (six) hours as needed (body aches).    . propranolol (INDERAL) 20 MG tablet Take 20 mg by mouth 2 (two) times daily as needed (anxiety).      No current facility-administered medications for this visit.    Allergies  Allergen Reactions  . Hydrocodone Other (See Comments)    GI upset, dizziness      Review of Systems:   General:  normal appetite, decreased energy, + weight gain, + weight loss, no fever  Cardiac:  no chest pain with exertion, no chest pain at rest, +SOB with exertion, no resting SOB, no PND, + orthopnea, no palpitations, + arrhythmia, + atrial fibrillation, no LE edema, no dizzy spells, no syncope  Respiratory:  + exertional shortness of breath, no home oxygen, no productive cough, no dry cough, no bronchitis, no wheezing, no hemoptysis, no asthma, no pain with inspiration or cough, no sleep apnea, no CPAP at night  GI:   no difficulty swallowing, no reflux, no frequent heartburn, no hiatal hernia, no abdominal pain, + constipation, no diarrhea, no hematochezia, no hematemesis, no melena  GU:   no dysuria,  no frequency, no urinary tract infection, no hematuria, no enlarged prostate, no kidney stones, no kidney disease  Vascular:  no pain suggestive of claudication, no pain in feet, no leg cramps, no varicose veins, no DVT, no non-healing foot ulcer  Neuro:   no stroke, no TIA's, no seizures, no headaches, no temporary blindness one eye,  no slurred speech, no peripheral neuropathy, + chronic pain, no instability of gait, no memory/cognitive dysfunction  Musculoskeletal: + arthritis, no joint swelling, no myalgias, no difficulty walking, normal mobility   Skin:   no rash, no itching, no skin infections, no pressure sores or ulcerations  Psych:   + anxiety, +  depression, no nervousness, no unusual recent stress  Eyes:   no blurry vision, no floaters, no recent vision changes, + wears glasses or contacts  ENT:   no hearing loss, no loose or painful teeth, no dentures, last saw dentist > year ago  Hematologic:  + easy bruising, no abnormal bleeding, no clotting disorder, no frequent epistaxis  Endocrine:  no diabetes, does not check CBG's at home     Physical Exam:   BP (!) 149/90   Pulse 90   Temp 97.7 F (36.5 C) (Skin)   Resp 20   Ht 6\' 5"  (1.956 m)   Wt 298 lb (135.2 kg)   SpO2 97% Comment: RA  BMI 35.34 kg/m   General:  Obese,  well-appearing  HEENT:  Unremarkable   Neck:   no JVD, no bruits, no adenopathy   Chest:   clear to auscultation, symmetrical breath sounds, no wheezes, no rhonchi   CV:   Irregular rate and rhythm, soft systolic murmur at apex  Abdomen:  soft, non-tender, no masses   Extremities:  warm, well-perfused, pulses diminished but palpable, no LE edema  Rectal/GU  Deferred  Neuro:   Grossly non-focal and symmetrical throughout  Skin:   Clean and dry, no rashes, no breakdown   Diagnostic Tests:  EKG: Atrial fibrillation w/ incomplete RBBB     ECHOCARDIOGRAM REPORT       Patient Name:   [name] Date of Exam: 04/25/2019 Medical Rec #:  [id number]         Height:       77.0 in Accession #:    [id number]  Weight:       308.0 lb Date of Birth:  1956/07/06        BSA:          2.69 m Patient Age:    36 years          BP:           134/84 mmHg Patient Gender: M                 HR:           95 bpm. Exam Location:  Outpatient  Procedure: 2D Echo, Cardiac Doppler and Color Doppler  Indications:    I48.1 Persistent atrial fibrillation   History:        Patient has prior history of Echocardiogram examinations, most                 recent 03/04/2019. Cardiomyopathy; Arrythmias:Atrial Fibrillation                 Risk Factors:Hypertension and Dyslipidemia. GERD. Narcotic                  addiction.   Sonographer:    Jonelle Sidle Dance Referring Phys: [place]    1. Left ventricular ejection fraction, by visual estimation, is 55 to 60%. The left ventricle has normal function. Normal left ventricular size. There is mildly increased left ventricular hypertrophy.  2. Global right ventricle has normal systolic function.The right ventricular size is normal. No increase in right ventricular wall thickness.  3. Left atrial size was moderately dilated.  4. Right atrial size was normal.  5. The mitral valve is normal in structure. Trace mitral valve regurgitation. No evidence of mitral stenosis.  6. The tricuspid valve is normal in structure. Tricuspid valve regurgitation was not visualized by color flow Doppler.  7. The aortic valve is normal in structure. Aortic valve regurgitation was not visualized by color flow Doppler. Structurally normal aortic valve, with no evidence of sclerosis or stenosis.  8. The pulmonic valve was normal in structure. Pulmonic valve regurgitation is not visualized by color flow Doppler.  9. The inferior vena cava is normal in size with greater than 50% respiratory variability, suggesting right atrial pressure of 3 mmHg.  FINDINGS  Left Ventricle: Left ventricular ejection fraction, by visual estimation, is 55 to 60%. The left ventricle has normal function. There is mildly increased left ventricular hypertrophy. Normal left ventricular size.  Right Ventricle: The right ventricular size is normal. No increase in right ventricular wall thickness. Global RV systolic function is has normal systolic function.  Left Atrium: Left atrial size was moderately dilated.  Right Atrium: Right atrial size was normal in size  Pericardium: There is no evidence of pericardial effusion.  Mitral Valve: The mitral valve is normal in structure. No evidence of mitral valve stenosis by observation. Trace mitral valve regurgitation.  Tricuspid  Valve: The tricuspid valve is normal in structure. Tricuspid valve regurgitation was not visualized by color flow Doppler.  Aortic Valve: The aortic valve is normal in structure. Aortic valve regurgitation was not visualized by color flow Doppler. The aortic valve is structurally normal, with no evidence of sclerosis or stenosis.  Pulmonic Valve: The pulmonic valve was normal in structure. Pulmonic valve regurgitation is not visualized by color flow Doppler.  Aorta: The aortic root, ascending aorta and aortic arch are all structurally normal, with no evidence of dilitation or obstruction.  Venous: The inferior vena cava is normal in size  with greater than 50% respiratory variability, suggesting right atrial pressure of 3 mmHg.  IAS/Shunts: No atrial level shunt detected by color flow Doppler. No ventricular septal defect is seen or detected. There is no evidence of an atrial septal defect.     LEFT VENTRICLE PLAX 2D LVIDd:         5.50 cm  Diastology LVIDs:         3.70 cm  LV e' lateral:   15.00 cm/s LV PW:         1.30 cm  LV E/e' lateral: 5.4 LV IVS:        1.10 cm LVOT diam:     2.30 cm LV SV:         89 ml LV SV Index:   31.90 LVOT Area:     4.15 cm    RIGHT VENTRICLE             IVC RV Basal diam:  3.20 cm     IVC diam: 1.90 cm RV Mid diam:    1.90 cm RV S prime:     11.00 cm/s TAPSE (M-mode): 2.5 cm  LEFT ATRIUM              Index       RIGHT ATRIUM           Index LA diam:        5.60 cm  2.08 cm/m  RA Area:     20.10 cm LA Vol (A2C):   135.0 ml 50.23 ml/m RA Volume:   50.30 ml  18.72 ml/m LA Vol (A4C):   107.0 ml 39.82 ml/m LA Biplane Vol: 120.0 ml 44.65 ml/m  AORTIC VALVE LVOT Vmax:   109.00 cm/s LVOT Vmean:  74.200 cm/s LVOT VTI:    0.202 m   AORTA Ao Root diam: 3.60 cm Ao Asc diam:  3.80 cm  MITRAL VALVE MV Area (PHT): 4.18 cm            SHUNTS MV PHT:        52.64 msec          Systemic VTI:  0.20 m MV Decel Time: 182 msec             Systemic Diam: 2.30 cm MV E velocity: 80.25 cm/s 103 cm/s    Candee Furbish MD Electronically signed by Candee Furbish MD Signature Date/Time: 04/25/2019/12:32:36 PM        TRANSESOPHOGEAL ECHO REPORT       Patient Name:   [name] Date of Exam: 03/04/2019 Medical Rec #:  [id number]         Height:       77.0 in Accession #:    [id number]        Weight:       329.0 lb Date of Birth:  [birth year]        BSA:          2.76 m Patient Age:    52 years          BP:           144/101 mmHg Patient Gender: M                 HR:           123 bpm. Exam Location:  Inpatient    Procedure: Transesophageal Echo, Cardiac Doppler and Color Doppler  Indications:     Afib   History:  Patient has prior history of Echocardiogram examinations, most                  recent 03/31/2017. Atrial Fibrillation Risk Factors:                  Hypertension, Obesity and Sleep Apnea.   Sonographer:     Dustin Flock Referring Phys:  [place] Diagnosing Phys: Kirk Ruths MD     PROCEDURE: The transesophogeal probe was passed through the esophogus of the patient. The patient developed no complications during the procedure.  IMPRESSIONS    1. The left ventricle has normal systolic function, with an ejection fraction of 55-60%. No evidence of left ventricular regional wall motion abnormalities.  2. The right ventricle has normal systolc function. The cavity was normal.  3. Left atrial size was severely dilated.  4. No evidence of a thrombus present in the left atrial appendage.  5. The mitral valve is abnormal. Mild thickening of the mitral valve leaflet. Mitral valve regurgitation is moderate to severe by color flow Doppler.  6. The aortic valve is tricuspid No stenosis of the aortic valve.  7. There is evidence of mild plaque in the descending aorta.  8. Normal LV systolic function; severe LAE with no LAA thrombus; mild RAE; moderately severe (3+) MR; mild  TR.  FINDINGS  Left Ventricle: The left ventricle has normal systolic function, with an ejection fraction of 55-60%. No evidence of left ventricular regional wall motion abnormalities.  Right Ventricle: The right ventricle has normal systolic function. The cavity was normal.  Left Atrium: Left atrial size was severely dilated.   Left Atrial Appendage: No evidence of a thrombus present in the left atrial appendage.  Right Atrium: Right atrial size was mildly dilated.  Interatrial Septum: No atrial level shunt detected by color flow Doppler.  Pericardium: There is no evidence of pericardial effusion.  Mitral Valve: The mitral valve is abnormal. Mild thickening of the mitral valve leaflet. Mitral valve regurgitation is moderate to severe by color flow Doppler.  Tricuspid Valve: The tricuspid valve was normal in structure. Tricuspid valve regurgitation is mild by color flow Doppler.  Aortic Valve: The aortic valve is tricuspid Aortic valve regurgitation was not visualized by color flow Doppler. There is No stenosis of the aortic valve.  Pulmonic Valve: The pulmonic valve was grossly normal. Pulmonic valve regurgitation is not visualized by color flow Doppler.  Aorta: There is evidence of mild plaque in the descending aorta.  Additional Findings: Normal LV systolic function; severe LAE with no LAA thrombus; mild RAE; moderately severe (3+) MR; mild TR.    Kirk Ruths MD Electronically signed by Kirk Ruths MD Signature Date/Time: 03/04/2019/10:21:22 AM         Impression:  Patient has recurrent persistent atrial fibrillation that has failed previous catheter-based ablation and multiple DC cardioversions on multiple different antiarrhythmic drugs.  He also has moderate mitral regurgitation that has waxed and waned in severity.  I personally reviewed the patient's recent transthoracic and transesophageal echocardiograms.  The patient has preserved left  ventricular systolic function with severe left atrial enlargement and dilated mitral annulus consistent with what has been termed by some as atrial type mitral regurgitation.  There is mild thickening of the anterior and posterior mitral valve leaflets although overall leaflet mobility appears normal and the mechanism of mitral regurgitation is type I, consistent with pure annular dilatation.  He describes stable symptoms of exertional shortness of breath and fatigue that  clearly get worse immediately when he is not in sinus rhythm.  He does not experience refractory palpitations or chest discomfort.    I agree the patient would likely benefit from mitral valve repair and Maze procedure in terms of symptomatic relief and improve exercise tolerance.  I have quoted him roughly 75% likelihood of successful return of sinus rhythm versus 25% chance of either early return of atrial fibrillation or atypical atrial flutter.  Prior to catheterization surgical intervention the patient would need to undergo diagnostic cardiac catheterization to rule out the presence of significant coronary artery disease.  In the absence of coronary artery disease the patient would be at relatively low surgical risk and may be good candidate for minimally invasive approach for surgery.  Alternative options include continued medical therapy with plans for only rate control and long-term anticoagulation versus another attempt at catheter-based ablation.  However, I agree with Dr. Jackalyn Lombard previous opinion that another attempt at catheter-based ablation would probably be associated with a relatively low likelihood of success.   Plan:  I have discussed the nature of the patient's recurrent persistent atrial fibrillation and mitral regurgitation at length with the patient and his wife in the office today.  We discussed the natural history and alternative treatment options at length including continued medical therapy and elective surgical  intervention.  All of their questions have been addressed.  The patient is interested in proceeding with elective surgery at some point in the near future, but he desires to wait at least a month or 2 to allow him to try to lose additional weight on his current diet and exercise program and to wait until after he has been vaccinated for the COVID-19 virus.  Under the circumstances this seems entirely reasonable.  Patient will return to our office for follow-up in approximately 2 months.  Depending on the status of the COVID-19 pandemic and how the patient is doing at that time we may consider proceeding with diagnostic cardiac catheterization and surgical intervention.  All questions answered.   I spent in excess of 90 minutes during the conduct of this office consultation and >50% of this time involved direct face-to-face encounter with the patient for counseling and/or coordination of their care.   Valentina Gu. Roxy Manns, MD 07/22/2019 11:26 AM

## 2019-07-25 ENCOUNTER — Other Ambulatory Visit: Payer: Self-pay | Admitting: Adult Health

## 2019-07-25 NOTE — Telephone Encounter (Signed)
[facility] Database Verified LR: 06-21-2019 Qty: 30 Pending appointment: No pending appt

## 2019-09-07 NOTE — Progress Notes (Signed)
I have reviewed, may continue follow-up with his primary care physician for continued management of his anxiety

## 2019-09-26 ENCOUNTER — Encounter: Payer: Self-pay | Admitting: Thoracic Surgery (Cardiothoracic Vascular Surgery)

## 2019-09-26 ENCOUNTER — Ambulatory Visit: Payer: 59 | Admitting: Thoracic Surgery (Cardiothoracic Vascular Surgery)

## 2019-09-26 ENCOUNTER — Other Ambulatory Visit: Payer: Self-pay

## 2019-09-26 VITALS — BP 139/108 | HR 98 | Temp 97.5°F | Resp 20 | Ht 76.0 in | Wt 280.0 lb

## 2019-09-26 DIAGNOSIS — I340 Nonrheumatic mitral (valve) insufficiency: Secondary | ICD-10-CM

## 2019-09-26 DIAGNOSIS — I4819 Other persistent atrial fibrillation: Secondary | ICD-10-CM

## 2019-09-26 DIAGNOSIS — I480 Paroxysmal atrial fibrillation: Secondary | ICD-10-CM | POA: Diagnosis not present

## 2019-09-26 NOTE — Patient Instructions (Signed)
Continue all previous medications without any changes at this time  

## 2019-09-26 NOTE — Progress Notes (Signed)
[address]       [place] [address]             [phone]     CARDIOTHORACIC SURGERY OFFICE NOTE  Referring Provider is Thompson Grayer, MD Primary Cardiologist is Kirk Ruths, MD PCP is [name], MD   HPI:  Patient is a 64 year old morbidly obese male with a longstanding history of atrial fibrillation that has failed previous catheter-based ablation and multiple DC cardioversions on multiple different antiarrhythmic drugs, mitral regurgitation, hypertension, GE reflux disease, hyperlipidemia, anxiety and depression who returns the office today for follow-up of recurrent persistent atrial fibrillation and moderate mitral regurgitation.  He was originally seen in consultation on July 22, 2019.  At that time we discussed alternative treatment options including continued medical therapy with an attempt at weight loss versus possible surgical intervention to include mitral valve repair and Maze procedure.  At that time the patient desired to make an effort to change his lifestyle significantly and try to lose some weight.  He returns her office today and reports that he has successfully lost 30 pounds over the past 2 months.  He states that he feels much better already, despite the fact that he remains in persistent atrial fibrillation.  He still gets short of breath with exertion but he is able to do much more than he could 2 months ago.  He denies resting shortness of breath or orthopnea.  He has not had any chest pain or chest tightness either with activity or at rest.  Overall he is delighted with his progress.   Current Outpatient Medications  Medication Sig Dispense Refill  . apixaban (ELIQUIS) 5 MG TABS tablet Take 1 tablet (5 mg total) by mouth 2 (two) times daily. 60 tablet 11  . diltiazem (CARDIZEM CD) 360 MG 24 hr capsule Take 1 capsule (360 mg total) by mouth daily. 90 capsule 3  . DULoxetine (CYMBALTA) 60 MG capsule Take 120 mg by mouth every  evening.     Marland Kitchen ibuprofen (ADVIL) 200 MG tablet Take 400 mg by mouth every 6 (six) hours as needed (body aches).    . lamoTRIgine (LAMICTAL) 25 MG tablet Take 1 tablet (25 mg total) by mouth 2 (two) times daily. 60 tablet 3  . lisinopril (ZESTRIL) 40 MG tablet Take 1 tablet (40 mg total) by mouth daily. 30 tablet 6  . metoprolol succinate (TOPROL XL) 25 MG 24 hr tablet Take 2 tablets (50 mg total) by mouth daily. 180 tablet 3  . potassium chloride SA (K-DUR) 20 MEQ tablet Take 1 tablet daily for 4 days, then only take on days that you need to take a lasix tablet. 30 tablet 5  . rOPINIRole (REQUIP) 1 MG tablet Take 1-2 mg by mouth at bedtime as needed (restless leg syndrome). 1-3 hours prior to bedtime     . sildenafil (REVATIO) 20 MG tablet Take 40-100 mg by mouth daily as needed (ED).     . traMADol (ULTRAM) 50 MG tablet Take 50-100 mg by mouth at bedtime as needed for pain.     No current facility-administered medications for this visit.      Physical Exam:   BP (!) 139/108 (BP Location: Right Arm)   Pulse 98   Temp (!) 97.5 F (36.4 C) (Temporal)   Resp 20   Ht 6\' 4"  (1.93 m)   Wt 280 lb (127 kg)   SpO2 97% Comment: RA  BMI 34.08 kg/m  General:  Obese but well-appearing  Chest:   Clear to auscultation  CV:   Irregular rate and rhythm, no murmur appreciated  Incisions:  n/a  Abdomen:  Soft nontender  Extremities:  Warm and well-perfused  Diagnostic Tests:  n/a   Impression:  Patient is doing better on medical therapy with pharmacologic rate control and long-term anticoagulation coupled with an aggressive attempt to change his lifestyle and lose weight.  He has successfully lost 30 pounds in weight over the past 2 months and his symptoms of exertional shortness of breath and fatigue have improved considerably.      Plan:  I favor continued medical therapy with attempts to lose additional weight and increase exercise rather than proceeding with surgery at this time.  We  discussed the relative indications for surgical intervention to restore sinus rhythm and correct the patient's mitral regurgitation.  I favor medical therapy at this time because the patient did not have severe mitral regurgitation to begin with and he continues to improve with weight loss and medical therapy.  However, if any further attempts to restore sinus rhythm to be considered I would favor proceeding within the next 3 to 6 months.  Patient will undergo follow-up echocardiogram to reassess severity of mitral regurgitation and left ventricular function in approximately 3 months.  He will return to our office at that time to see how he is getting along.  All questions answered.  I spent in excess of 15 minutes during the conduct of this office consultation and >50% of this time involved direct face-to-face encounter with the patient for counseling and/or coordination of their care.    Valentina Gu. Roxy Manns, MD 09/26/2019 11:56 AM

## 2019-11-04 ENCOUNTER — Telehealth: Payer: 59 | Admitting: Cardiology

## 2019-11-14 ENCOUNTER — Other Ambulatory Visit: Payer: Self-pay | Admitting: Thoracic Surgery (Cardiothoracic Vascular Surgery)

## 2019-11-14 DIAGNOSIS — I340 Nonrheumatic mitral (valve) insufficiency: Secondary | ICD-10-CM

## 2019-11-24 ENCOUNTER — Institutional Professional Consult (permissible substitution): Payer: 59 | Admitting: Neurology

## 2019-11-25 IMAGING — CT CT ANGIOGRAPHY CHEST
1 of 8 series · 3 of 16 positions shown · IV contrast (omnipaque)
Comparison: Chest radiography same day.  Chest CT 01/13/2013

CLINICAL DATA: Three 4 days of hypertension and shortness of
breath.

EXAM:
CT ANGIOGRAPHY CHEST WITH CONTRAST
TECHNIQUE: Multidetector CT imaging of the chest was performed using the
standard protocol during bolus administration of intravenous
contrast. Multiplanar CT image reconstructions and MIPs were
obtained to evaluate the vascular anatomy.
CONTRAST:  100mL OMNIPAQUE IOHEXOL 350 MG/ML SOLN

[Series 10: pe thins · axial · 0.83mm/px · z∈[-279,-117]mm · 3 of 325 slices shown]
[im 82/325  lung]
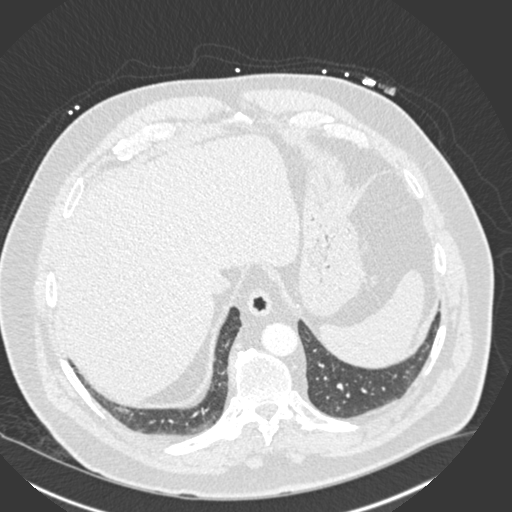
[im 163/325  soft-tissue]
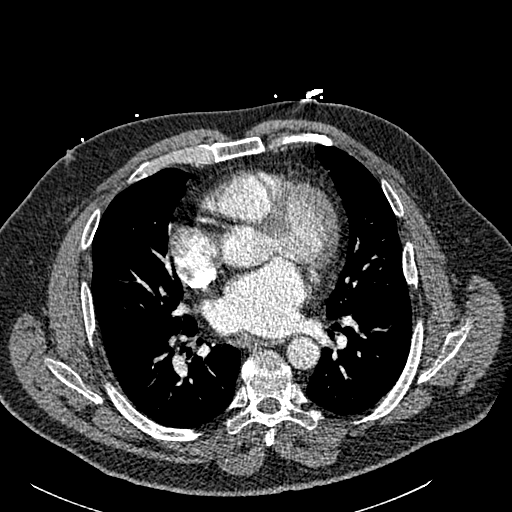
[im 244/325  lung]
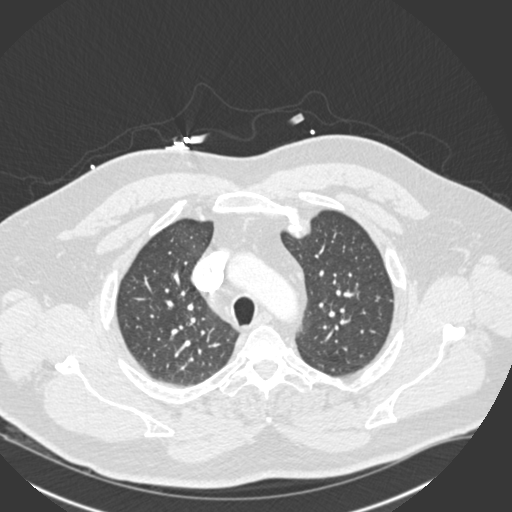

[3 of 16 positions shown; findings below may reference images not displayed]

FINDINGS: Cardiovascular: Pulmonary arterial opacification is good. No
pulmonary emboli. No aortic atherosclerosis. Heart size is normal.
No pericardial fluid.

Mediastinum/Nodes: No mass or lymphadenopathy.

Lungs/Pleura: Lungs are clear. No infiltrate, mass, effusion or
collapse.

Upper Abdomen: Right renal cyst.  Otherwise normal.

Musculoskeletal: Ordinary mild thoracic degenerative changes.

Review of the MIP images confirms the above findings.
IMPRESSION: No pulmonary emboli.  No acute chest pathology of any sort.

## 2019-11-25 IMAGING — US VENOUS DOPPLER ULTRASOUND OF LEFT LOWER EXTREMITY
1 series · 13 of 24 positions shown · non-contrast
Comparison: None.

CLINICAL DATA: Left lower extremity pain and edema. History of
lower extremity injury in [REDACTED]. Evaluate for DVT.



[Series 1: venous doppler ultrasound of left lower extremity · 13 of 35 slices shown]
[im 1/35]
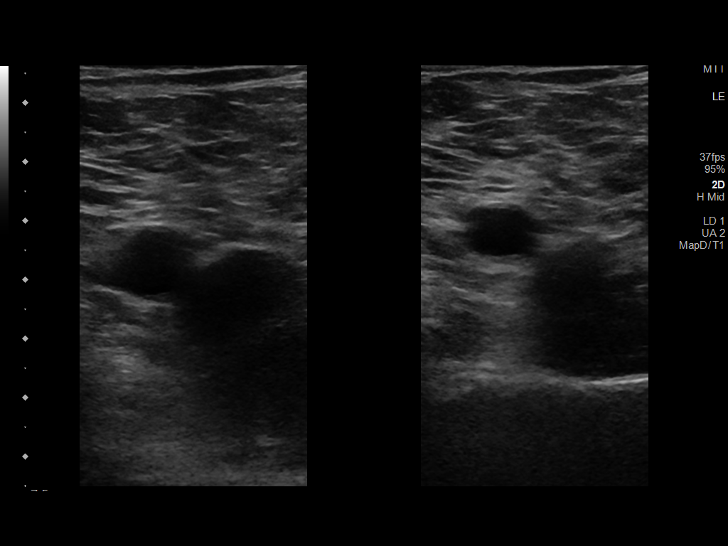
[im 3/35]
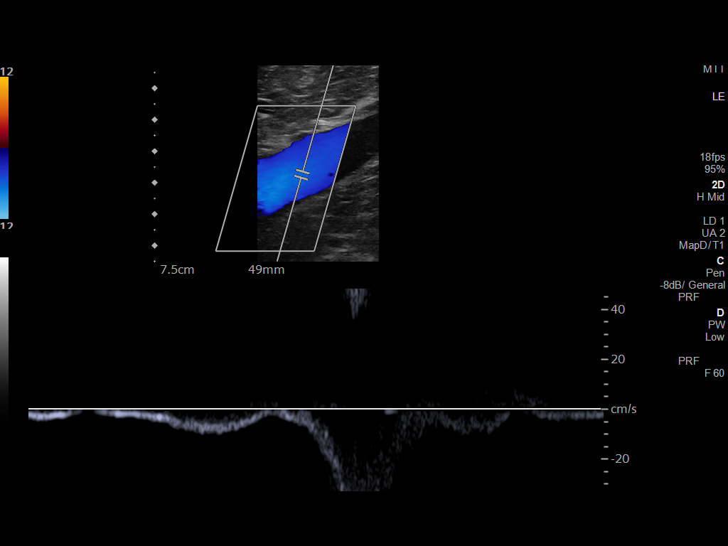
[im 6/35]
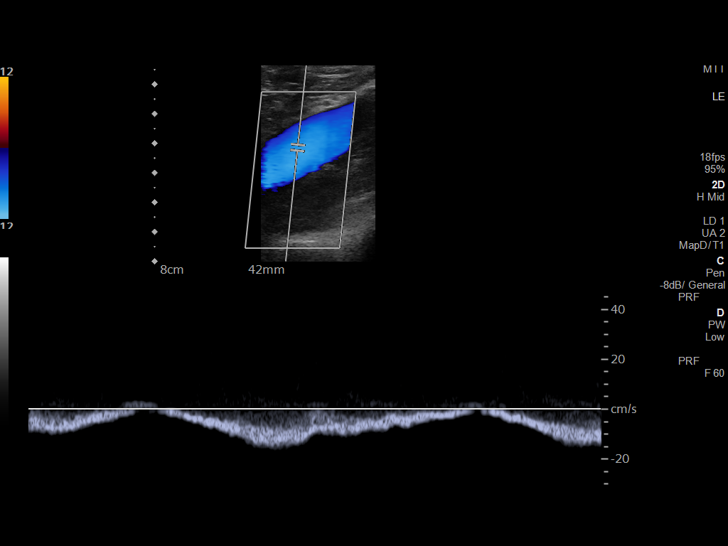
[im 9/35]
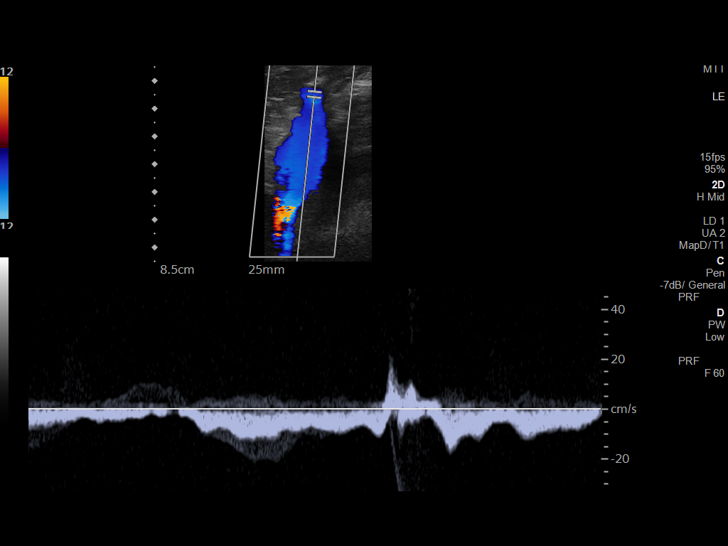
[im 12/35]
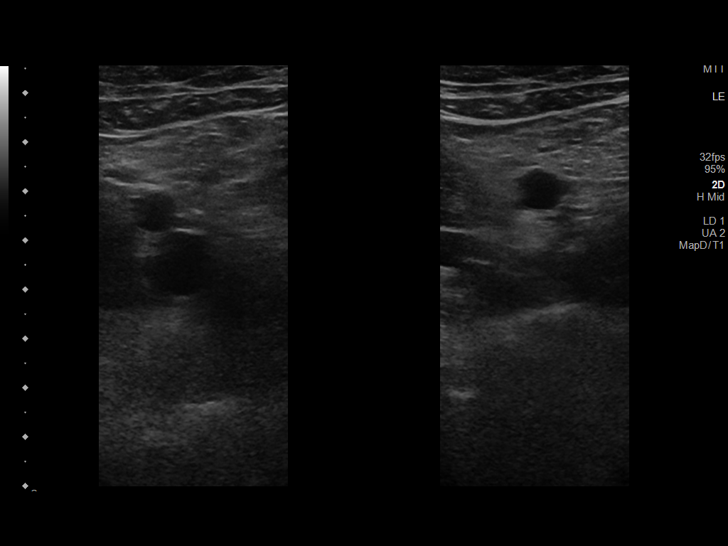
[im 15/35]
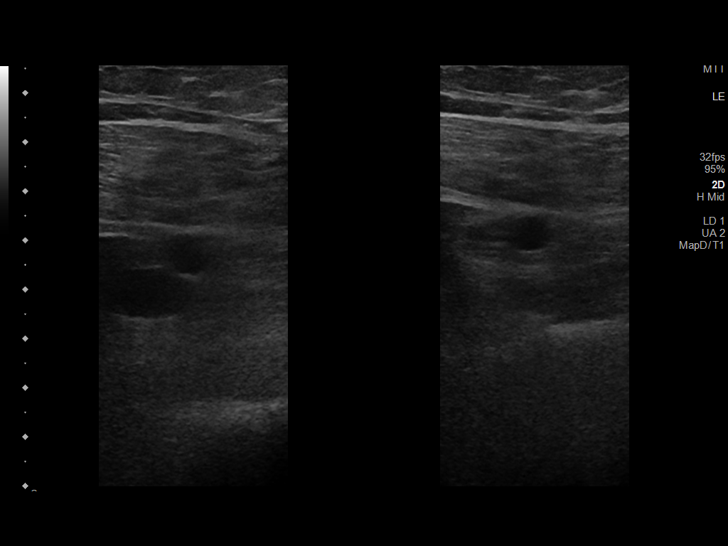
[im 18/35]
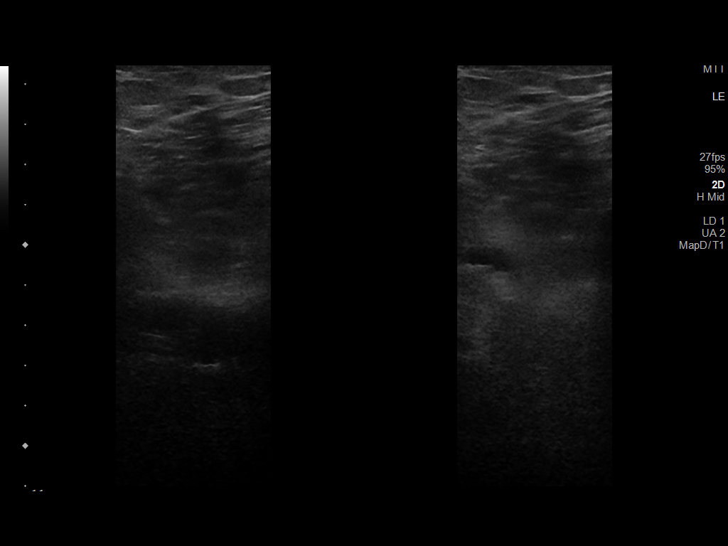
[im 20/35]
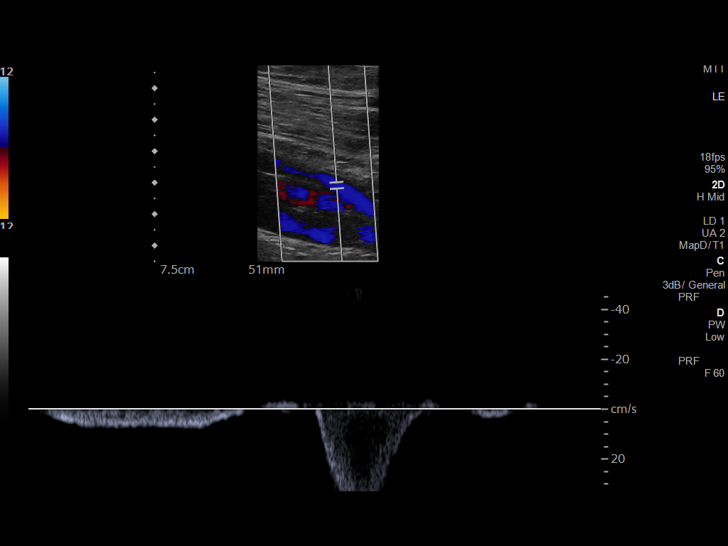
[im 23/35]
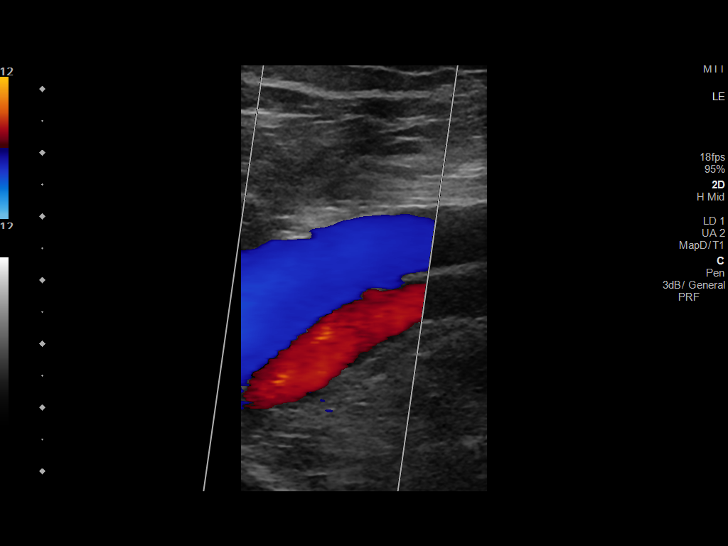
[im 26/35]
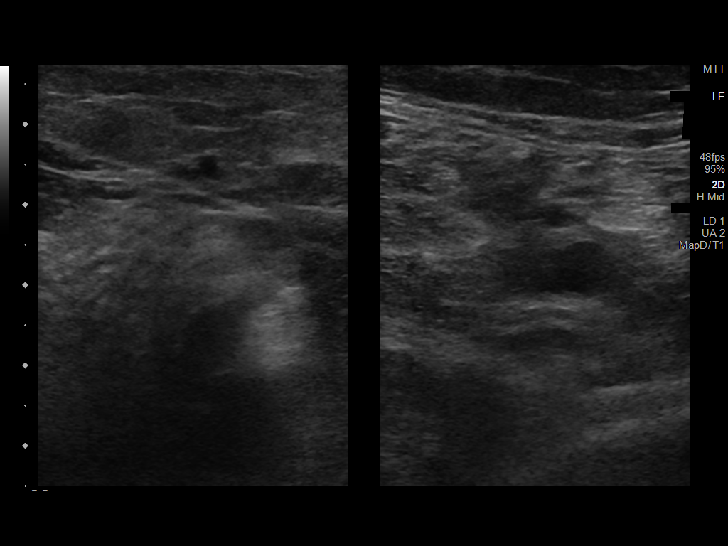
[im 29/35]
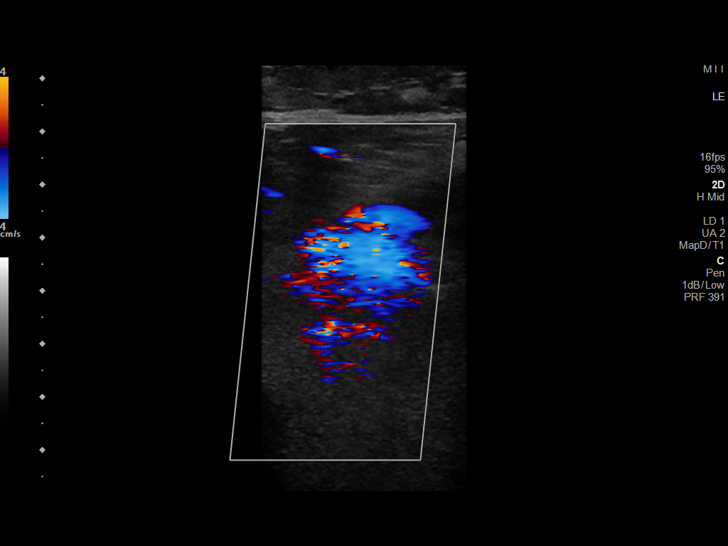
[im 32/35]
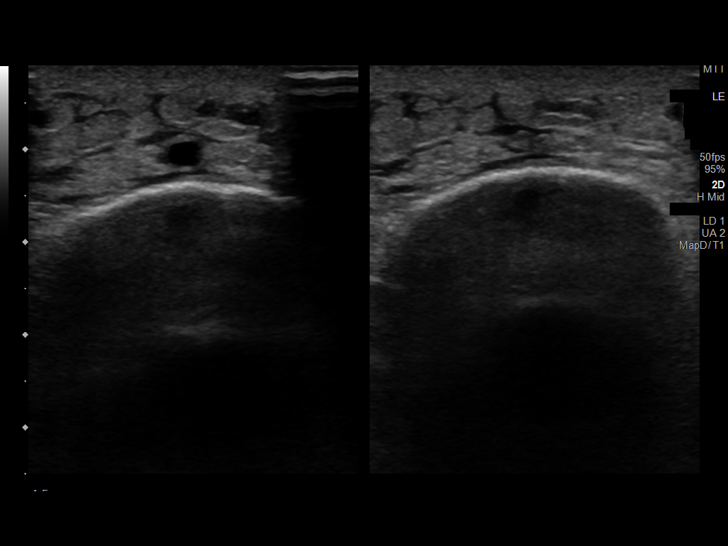
[im 35/35]
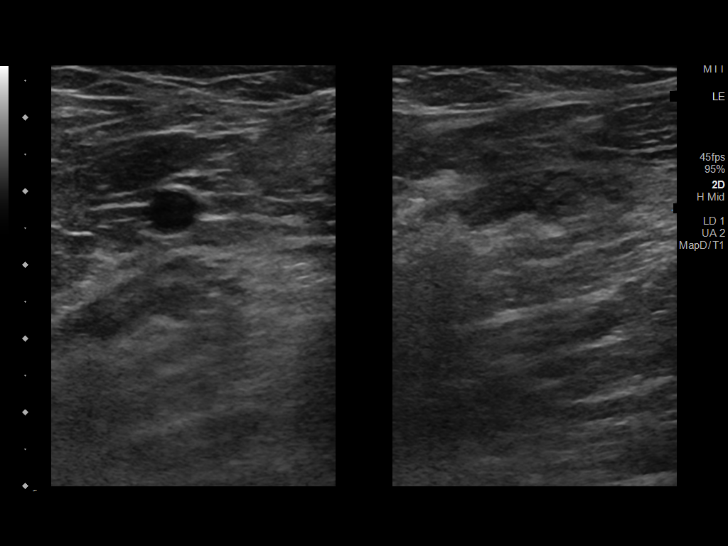

[13 of 24 positions shown; findings below may reference images not displayed]

FINDINGS: Contralateral Common Femoral Vein: Respiratory phasicity is normal
and symmetric with the symptomatic side. No evidence of thrombus.
Normal compressibility.

Common Femoral Vein: No evidence of thrombus. Normal
compressibility, respiratory phasicity and response to augmentation.

Saphenofemoral Junction: No evidence of thrombus. Normal
compressibility and flow on color Doppler imaging.

Profunda Femoral Vein: No evidence of thrombus. Normal
compressibility and flow on color Doppler imaging.

Femoral Vein: No evidence of thrombus. Normal compressibility,
respiratory phasicity and response to augmentation.

Popliteal Vein: No evidence of thrombus. Normal compressibility,
respiratory phasicity and response to augmentation.

Calf Veins: No evidence of thrombus. Normal compressibility and flow
on color Doppler imaging.

Superficial Great Saphenous Vein: No evidence of thrombus. Normal
compressibility.

Venous Reflux:  None.

Other Findings: There is a minimal amount of subcutaneous edema at
the level of the left lower leg and ankle.
IMPRESSION: No evidence of DVT within the left lower extremity.

## 2019-12-19 ENCOUNTER — Ambulatory Visit: Payer: 59 | Admitting: Thoracic Surgery (Cardiothoracic Vascular Surgery)

## 2019-12-26 ENCOUNTER — Ambulatory Visit: Payer: 59 | Admitting: Thoracic Surgery (Cardiothoracic Vascular Surgery)

## 2019-12-27 ENCOUNTER — Other Ambulatory Visit (HOSPITAL_COMMUNITY): Payer: 59

## 2019-12-30 ENCOUNTER — Encounter: Payer: Self-pay | Admitting: *Deleted

## 2020-01-02 ENCOUNTER — Ambulatory Visit: Payer: 59 | Admitting: Thoracic Surgery (Cardiothoracic Vascular Surgery)

## 2020-01-16 ENCOUNTER — Ambulatory Visit: Payer: 59 | Admitting: Neurology

## 2020-01-16 ENCOUNTER — Encounter: Payer: Self-pay | Admitting: Neurology

## 2020-01-16 ENCOUNTER — Other Ambulatory Visit: Payer: Self-pay

## 2020-01-16 VITALS — BP 157/94 | HR 93 | Ht 76.0 in | Wt 290.0 lb

## 2020-01-16 DIAGNOSIS — G259 Extrapyramidal and movement disorder, unspecified: Secondary | ICD-10-CM | POA: Diagnosis not present

## 2020-01-16 DIAGNOSIS — G4730 Sleep apnea, unspecified: Secondary | ICD-10-CM | POA: Insufficient documentation

## 2020-01-16 DIAGNOSIS — R259 Unspecified abnormal involuntary movements: Secondary | ICD-10-CM

## 2020-01-16 DIAGNOSIS — G2581 Restless legs syndrome: Secondary | ICD-10-CM | POA: Insufficient documentation

## 2020-01-16 MED ORDER — PREGABALIN 100 MG PO CAPS: 100.0000 mg | ORAL_CAPSULE | Freq: Three times a day (TID) | ORAL | 5 refills | Status: DC

## 2020-01-16 NOTE — Progress Notes (Signed)
5

## 2020-01-16 NOTE — Progress Notes (Signed)
History: [name]  is a 64 year old male, accompanied by his wife, seen in request by primary care physician Dr. [name] for evaluation of left jerking movement, patient also complains of significant anxiety, panic attack, initial visit was in Sept 2020  I have reviewed and summarized the referring note from the referring physician.  He had a past medical history of hypertension, atrial fibrillation, on Xarelto treatment, also hyperlipidemia,  He began to complains of significant anxiety since the beginning of 2020, frequent panic attacks, feels very uneasy, quizzy sensation, difficulty breathing, frightened sensation, difficulty sleeping, lasting 2 to 3 hours, Xanax as needed was helpful, he was having anxiety, panic attack 3-4 times each week.  About July 2020, he began to have different episode, he had uncontrollable left leg large amplitude jerking movement, lasting for few seconds, followed by another jerking movement, whole episode can last 30 minutes, there is usually no panic, anxiety symptoms associated with his uncontrollable left jumpy movement, he was started on Requip 1 mg 2 tablets today, which has been very helpful  He denies significant bilateral upper lower extremity paresthesia, denies gait abnormality, there is no typical restless leg symptoms of urge to move his legs while holding still,.  He has been taking Cymbalta 60 mg twice a day for his anxiety with some help, he is hoping to see a psychiatrist, he is most worried about his worsening anxiety and panic attack  Update January 16, 2020: He was seen by nurse practitioner Jinny Blossom in December 2020, reported some improvement of his symptoms, since she was started on Requip higher dose, he is now taking 1 mg during the day, 2 mg at nighttime  He carries a diagnosis of restless leg symptoms, he described it at deep achy pain, discomfort, initially at nighttime, now begin to notice symptoms evening, early  afternoon, he reported intermittent leg jerking movement, to the point of moving his foot from pedal while driving, last few seconds, no loss of consciousness,  I reviewed his cardiologist Dr. Kirk Ruths evaluation January 2021, persistent atrial fibrillation, recent attempt of cardioversion, amiodarone was unsuccessful, he is symptomatic, planning on referral to CVTS for consideration of surgical maze, continue Coumadin, history of mitral valve regurgitation, 3+ at previous TEE, history of cardiomyopathy, previously felt to be tachycardia mediated, left ventricular function is normal, hypertension, but blood pressure is well controlled, chronic diastolic congestive heart failure, exaggerated by atrial arrhythmia,  obstructive sleep apnea,  Laboratory evaluations in April 2021, normal CMP, creatinine of 0.98, CBC, hemoglobin of 13.2, PSA 2.48 LDL of 70  He self realize he is dealing with significant anxiety," not handle stress very well", he is seeing psychotherapy, which has helped, he also complains of frequent wakening at nighttime, fatigue sleepiness during the day, he does have obesity   REVIEW OF SYSTEMS: Out of a complete 14 system review of symptoms, the patient complains only of the following symptoms, and all other reviewed systems are negative.  See HPI  ALLERGIES: Allergies  Allergen Reactions  . Hydrocodone Other (See Comments)    GI upset, dizziness    HOME MEDICATIONS: Outpatient Medications Prior to Visit  Medication Sig Dispense Refill  . ALPRAZolam (XANAX) 0.5 MG tablet Take 0.5 mg by mouth daily as needed.    Marland Kitchen apixaban (ELIQUIS) 5 MG TABS tablet Take 1 tablet (5 mg total) by mouth 2 (two) times daily. 60 tablet 11  . diltiazem (CARDIZEM CD) 360 MG 24 hr capsule Take 1 capsule (360 mg  total) by mouth daily. 90 capsule 3  . gabapentin (NEURONTIN) 300 MG capsule Take 600-1,200 mg by mouth at bedtime.    Marland Kitchen ibuprofen (ADVIL) 200 MG tablet Take 400 mg by mouth every 6  (six) hours as needed (body aches).    Marland Kitchen lisinopril (ZESTRIL) 40 MG tablet Take 1 tablet (40 mg total) by mouth daily. 30 tablet 6  . metoprolol succinate (TOPROL XL) 25 MG 24 hr tablet Take 2 tablets (50 mg total) by mouth daily. (Patient taking differently: Take 50 mg by mouth daily as needed. ) 180 tablet 3  . montelukast (SINGULAIR) 10 MG tablet Take 10 mg by mouth daily as needed.    Marland Kitchen omeprazole (PRILOSEC) 20 MG capsule Take 20 mg by mouth daily as needed.    Marland Kitchen rOPINIRole (REQUIP) 1 MG tablet Take 1-2 mg by mouth at bedtime as needed (restless leg syndrome). 1 tablet after lunch and 2 tablet at bedtime    . sildenafil (REVATIO) 20 MG tablet Take 40-100 mg by mouth daily as needed (ED).     . traMADol (ULTRAM) 50 MG tablet Take 50 mg by mouth at bedtime as needed for pain.     . DULoxetine (CYMBALTA) 60 MG capsule Take 120 mg by mouth every evening.     . lamoTRIgine (LAMICTAL) 25 MG tablet Take 1 tablet (25 mg total) by mouth 2 (two) times daily. 60 tablet 3  . potassium chloride SA (K-DUR) 20 MEQ tablet Take 1 tablet daily for 4 days, then only take on days that you need to take a lasix tablet. 30 tablet 5   No facility-administered medications prior to visit.    PAST MEDICAL HISTORY: Past Medical History:  Diagnosis Date  . Allergic rhinitis   . Anxiety   . Asthma    as a child  . Chronic low back pain   . Complication of anesthesia    "hard to put asleep, hard to wake up, nausea and vomiting"  . Depression   . ED (erectile dysfunction)   . GERD (gastroesophageal reflux disease)   . HLD (hyperlipidemia)   . HTN (hypertension)    sees Dr. [name], [facility]  . Hypertrophy of prostate with urinary obstruction and other lower urinary tract symptoms (LUTS)   . IBS (irritable bowel syndrome)   . Inguinal hernia without mention of obstruction or gangrene, unilateral or unspecified, (not specified as recurrent)   . Insomnia   . Lumbar herniated disc    L7  . Morbid  obesity ([place])   . Narcotic addiction ([place])   . NICM (nonischemic cardiomyopathy) ([facility])    tachycardia mediated,  resolved with sinus  . Persistent atrial fibrillation ([place])    ablation done 03/2006 at [facility]  . Pneumonia    hx of 2004  . PONV (postoperative nausea and vomiting)   . Twitching    legs    PAST SURGICAL HISTORY: Past Surgical History:  Procedure Laterality Date  . ATRIAL ABLATION SURGERY  2007   [facility]  . CARDIOVERSION  08/11/2011   Procedure: CARDIOVERSION;  Surgeon: Lelon Perla, MD;  Location: [facility];  Service: Cardiovascular;  Laterality: N/A;  . CARDIOVERSION N/A 04/16/2017   Procedure: CARDIOVERSION;  Surgeon: Sanda Klein, MD;  Location: [facility];  Service: Cardiovascular;  Laterality: N/A;  . CARDIOVERSION N/A 03/04/2019   Procedure: CARDIOVERSION;  Surgeon: Lelon Perla, MD;  Location: [facility] ENDOSCOPY;  Service: Cardiovascular;  Laterality: N/A;  . CARDIOVERSION N/A 07/04/2019   Procedure:  CARDIOVERSION;  Surgeon: [name], MD;  Location: [facility] ENDOSCOPY;  Service: Cardiovascular;  Laterality: N/A;  . EYE SURGERY     lasik, 1998  . HERNIA REPAIR     1977  . INGUINAL HERNIA REPAIR Right 08/26/2012   Procedure: LAPAROSCOPIC INGUINAL HERNIA;  Surgeon: Madilyn Hook, DO;  Location: [facility];  Service: General;  Laterality: Right;  laparoscopic right inguinal hernia repair with mesh, umbilical hernia repair  . INSERTION OF MESH Right 08/26/2012   Procedure: INSERTION OF MESH;  Surgeon: Madilyn Hook, DO;  Location: [facility];  Service: General;  Laterality: Right;  right inguinal hernia  . TEE WITHOUT CARDIOVERSION N/A 03/04/2019   Procedure: TRANSESOPHAGEAL ECHOCARDIOGRAM (TEE);  Surgeon: Lelon Perla, MD;  Location: [facility];  Service: Cardiovascular;  Laterality: N/A;  . UMBILICAL HERNIA REPAIR N/A 08/26/2012   Procedure: HERNIA REPAIR UMBILICAL ADULT;  Surgeon: Madilyn Hook, DO;  Location: [facility] OR;  Service: General;  Laterality: N/A;    FAMILY  HISTORY: Family History  Problem Relation Age of Onset  . Asthma Mother   . Breast cancer Mother   . Hypertension Mother   . COPD Father   . Prostate cancer Father   . Hypertension Father   . Lymphoma Sister     SOCIAL HISTORY: Social History   Socioeconomic History  . Marital status: Married    Spouse name: Not on file  . Number of children: 3  . Years of education: Not on file  . Highest education level: Not on file  Occupational History  . 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 Smoking status: Never Smoker  . Smokeless tobacco: Never Used  Vaping Use  . Vaping Use: Never used  Substance and Sexual Activity  . Alcohol use: No    Alcohol/week: 0.0 standard drinks  . Drug use: No  . Sexual activity: Yes  Other Topics Concern  . Not on file  Social History Narrative   Lives in [place]   Social Determinants of Health   Financial Resource Strain:   . Difficulty of Paying Living Expenses:   Food Insecurity:   . Worried About Charity fundraiser in the Last Year:   . Arboriculturist in the Last Year:   Transportation Needs:   . Film/video editor (Medical):   Marland Kitchen Lack of Transportation (Non-Medical):   Physical Activity:   . Days of Exercise per Week:   . Minutes of Exercise per Session:   Stress:   . Feeling of Stress :   Social Connections:   . Frequency of Communication with Friends and Family:   . Frequency of Social Gatherings with Friends and Family:   . Attends Religious Services:   . Active Member of Clubs or Organizations:   . Attends Archivist Meetings:   Marland Kitchen Marital Status:   Intimate Partner Violence:   . Fear of Current or Ex-Partner:   . Emotionally Abused:   Marland Kitchen Physically Abused:   . Sexually Abused:       PHYSICAL EXAM  Vitals:   01/16/20 1314  BP: (!) 157/94  Pulse: 93  Weight: 290 lb (131.5 kg)  Height: 6\' 4"  (1.93 m)   Body mass index is 35.3 kg/m.  Generalized: Well developed, in no acute  distress, central obesity,  Neurological examination  Mentation: Alert oriented to time, place, history taking. Follows all commands speech and language fluent Cranial nerve II-XII: Pupils were equal round reactive to light. Extraocular movements were full,  visual field were full on confrontational test. Facial sensation and strength were normal. Uvula tongue midline. Head turning and shoulder shrug  were normal and symmetric. Motor: The motor testing reveals 5 over 5 strength of all 4 extremities. Good symmetric motor tone is noted throughout.  Sensory: Sensory testing is intact to soft touch on all 4 extremities. No evidence of extinction is noted.  Coordination: Cerebellar testing reveals good finger-nose-finger and heel-to-shin bilaterally.  Gait and station: Gait is normal.  Reflexes: Deep tendon reflexes are symmetric and normal bilaterally.   DIAGNOSTIC DATA (LABS, IMAGING, TESTING) - I reviewed patient records, labs, notes, testing and imaging myself where available.  Lab Results  Component Value Date   WBC 9.9 03/03/2019   HGB 10.9 (L) 03/03/2019   HCT 35.6 (L) 03/03/2019   MCV 77 (L) 03/03/2019   PLT 320 03/03/2019      Component Value Date/Time   NA 141 06/27/2019 1602   K 4.3 06/27/2019 1602   CL 99 06/27/2019 1602   CO2 23 06/27/2019 1602   GLUCOSE 85 06/27/2019 1602   GLUCOSE 100 (H) 05/02/2019 1137   BUN 25 06/27/2019 1602   CREATININE 1.60 (H) 06/27/2019 1602   CREATININE 1.10 11/15/2015 0847   CALCIUM 9.5 06/27/2019 1602   PROT 7.2 05/02/2019 1137   ALBUMIN 3.6 05/02/2019 1137   AST 45 (H) 05/02/2019 1137   ALT 50 (H) 05/02/2019 1137   ALKPHOS 93 05/02/2019 1137   BILITOT 0.5 05/02/2019 1137   GFRNONAA 45 (L) 06/27/2019 1602   GFRAA 53 (L) 06/27/2019 1602   Lab Results  Component Value Date   CHOL  02/14/2009    126        ATP III CLASSIFICATION:  <200     mg/dL   Desirable  200-239  mg/dL   Borderline High  >=240    mg/dL   High          HDL 48  02/14/2009   LDLCALC  02/14/2009    59        Total Cholesterol/HDL:CHD Risk Coronary Heart Disease Risk Table                     Men   Women  1/2 Average Risk   3.4   3.3  Average Risk       5.0   4.4  2 X Average Risk   9.6   7.1  3 X Average Risk  23.4   11.0        Use the calculated Patient Ratio above and the CHD Risk Table to determine the patient's CHD Risk.        ATP III CLASSIFICATION (LDL):  <100     mg/dL   Optimal  100-129  mg/dL   Near or Above                    Optimal  130-159  mg/dL   Borderline  160-189  mg/dL   High  >190     mg/dL   Very High   TRIG 95 02/14/2009   CHOLHDL 2.6 02/14/2009   No results found for: HGBA1C No results found for: VITAMINB12 Lab Results  Component Value Date   TSH 1.256 05/02/2019      ASSESSMENT AND PLAN 64 y.o. year old male  Uncontrollable body jerking movement Restless leg symptoms Risk for obstructive sleep apnea  Refer to sleep study  Laboratory evaluation including ferritin level  Will decrease  his Requip only 2 mg at nighttime to avoid augmentation  He has been taking gabapentin 300 mg 2 to 4 tablets every night without helping his symptoms, will change to Lyrica up to 100 mg 3 times a day    Marcial Pacas, M.D. Ph.D.  [facility] [place], [place] [address] Phone: [phone] Fax:      [phone]

## 2020-01-17 ENCOUNTER — Telehealth: Payer: Self-pay | Admitting: Neurology

## 2020-01-17 LAB — VITAMIN B12: Vitamin B-12: 749 pg/mL (ref 232–1245)

## 2020-01-17 LAB — RPR: RPR Ser Ql: NONREACTIVE

## 2020-01-17 LAB — TSH: TSH: 0.942 u[IU]/mL (ref 0.450–4.500)

## 2020-01-17 LAB — FERRITIN: Ferritin: 25 ng/mL — ABNORMAL LOW (ref 30–400)

## 2020-01-17 NOTE — Telephone Encounter (Signed)
Please call patient, laboratory evaluation showed decreased ferritin 25 NG per mL, with normal being 30 and above, we would like patient with restless leg syndrome to keep ferritin level 50 and above  He should start over-the-counter ferrous sulfate supplement, taking according to instruction.  I have forward the lab results to his primary care physician [name], MD, may repeat ferritin level in 6 months by his primary care physician  Rest of the laboratory evaluations were normal.

## 2020-01-17 NOTE — Telephone Encounter (Signed)
Called the patient and left a detailed message advising the lab results and findings. Advised that I would send a mychart message with the name of the over the counter supplement she would like the patient to start to increase the ferritin level. Instructed the pt to reply to [facility] or call office with any questions.

## 2020-01-19 ENCOUNTER — Other Ambulatory Visit: Payer: Self-pay

## 2020-01-19 ENCOUNTER — Ambulatory Visit (HOSPITAL_COMMUNITY): Payer: 59 | Attending: Internal Medicine

## 2020-01-19 DIAGNOSIS — I340 Nonrheumatic mitral (valve) insufficiency: Secondary | ICD-10-CM

## 2020-01-19 LAB — ECHOCARDIOGRAM COMPLETE: S' Lateral: 3.6 cm

## 2020-01-23 ENCOUNTER — Ambulatory Visit: Payer: 59 | Admitting: Thoracic Surgery (Cardiothoracic Vascular Surgery)

## 2020-01-24 ENCOUNTER — Other Ambulatory Visit: Payer: Self-pay | Admitting: Cardiology

## 2020-02-03 ENCOUNTER — Other Ambulatory Visit: Payer: Self-pay | Admitting: Adult Health

## 2020-02-06 ENCOUNTER — Ambulatory Visit: Payer: 59 | Admitting: Thoracic Surgery (Cardiothoracic Vascular Surgery)

## 2020-03-05 ENCOUNTER — Ambulatory Visit: Payer: 59 | Admitting: Thoracic Surgery (Cardiothoracic Vascular Surgery)

## 2020-03-09 ENCOUNTER — Ambulatory Visit: Payer: 59 | Admitting: Thoracic Surgery (Cardiothoracic Vascular Surgery)

## 2020-03-14 ENCOUNTER — Institutional Professional Consult (permissible substitution): Payer: 59 | Admitting: Neurology

## 2020-03-14 ENCOUNTER — Telehealth: Payer: Self-pay | Admitting: Neurology

## 2020-03-14 NOTE — Telephone Encounter (Signed)
Pt was no show to the sleep consult apt

## 2020-03-19 ENCOUNTER — Encounter: Payer: 59 | Admitting: Thoracic Surgery (Cardiothoracic Vascular Surgery)

## 2020-03-20 NOTE — Progress Notes (Signed)
This encounter was created in error - please disregard.

## 2020-04-23 ENCOUNTER — Ambulatory Visit: Payer: Self-pay | Admitting: Neurology

## 2020-04-23 ENCOUNTER — Encounter: Payer: Self-pay | Admitting: Neurology

## 2020-04-23 NOTE — Progress Notes (Deleted)
PATIENT: [name] DOB: 09-17-1955  REASON FOR VISIT: follow up HISTORY FROM: patient  HISTORY OF PRESENT ILLNESS: Today 04/23/20  HISTORY [name] a 64 year old male,accompanied by his wife,seen in request by primary care physician Dr.Reade, Robertfor evaluation of left jerking movement, patient also complains of significant anxiety, panic attack, initial visit was in Sept 2020  I have reviewed and summarized the referring note from the referring physician.He had a past medical history of hypertension, atrial fibrillation, on Xarelto treatment, also hyperlipidemia,  He began to complains of significant anxiety since the beginning of 2020, frequent panic attacks, feels very uneasy,quizzysensation, difficulty breathing, frightened sensation, difficulty sleeping, lasting 2 to 3 hours, Xanax as needed was helpful,he was having anxiety, panic attack 3-4 times each week.  About July 2020, he began to have different episode, he had uncontrollable left leg large amplitude jerking movement, lasting for few seconds, followed by another jerking movement, whole episode can last 30 minutes, there is usually no panic, anxiety symptoms associated with his uncontrollable left jumpy movement, he was started on Requip 1 mg 2 tablets today, which has been very helpful  He denies significant bilateral upper lower extremity paresthesia, denies gait abnormality, there is no typical restless leg symptoms of urge to move his legs while holding still,.  He has been taking Cymbalta 60 mg twice a day for his anxiety with some help, he is hoping to see a psychiatrist, he is most worried about his worsening anxietyandpanic attack  Update January 16, 2020: He was seen by nurse practitioner Jinny Blossom in December 2020, reported some improvement of his symptoms, since she was started on Requip higher dose, he is now taking 1 mg during the day, 2 mg at nighttime  He carries a diagnosis  of restless leg symptoms, he described it at deep achy pain, discomfort, initially at nighttime, now begin to notice symptoms evening, early afternoon, he reported intermittent leg jerking movement, to the point of moving his foot from pedal while driving, last few seconds, no loss of consciousness,  I reviewed his cardiologist Dr. Kirk Ruths evaluation January 2021, persistent atrial fibrillation, recent attempt of cardioversion, amiodarone was unsuccessful, he is symptomatic, planning on referral to CVTS for consideration of surgical maze, continue Coumadin, history of mitral valve regurgitation, 3+ at previous TEE, history of cardiomyopathy, previously felt to be tachycardia mediated, left ventricular function is normal, hypertension, but blood pressure is well controlled, chronic diastolic congestive heart failure, exaggerated by atrial arrhythmia,  obstructive sleep apnea,  Laboratory evaluations in April 2021, normal CMP, creatinine of 0.98, CBC, hemoglobin of 13.2, PSA 2.48 LDL of 70  He self realize he is dealing with significant anxiety," not handle stress very well", he is seeing psychotherapy, which has helped, he also complains of frequent wakening at nighttime, fatigue sleepiness during the day, he does have obesity  Update April 23, 2020 SS: When last seen, ferritin level was low 25, with RLS, keep ferritin level above 50, started over-the-counter ferrous sulfate, was referred for sleep consultation, was a no-show.  REVIEW OF SYSTEMS: Out of a complete 14 system review of symptoms, the patient complains only of the following symptoms, and all other reviewed systems are negative.  ALLERGIES: Allergies  Allergen Reactions  . Hydrocodone Other (See Comments)    GI upset, dizziness    HOME MEDICATIONS: Outpatient Medications Prior to Visit  Medication Sig Dispense Refill  . ALPRAZolam (XANAX) 0.5 MG tablet TAKE 1 TABLET(0.5 MG) BY MOUTH AT BEDTIME AS  NEEDED FOR ANXIETY OR  SLEEP 30 tablet 5  . apixaban (ELIQUIS) 5 MG TABS tablet Take 1 tablet (5 mg total) by mouth 2 (two) times daily. 60 tablet 11  . diltiazem (CARDIZEM CD) 360 MG 24 hr capsule TAKE 1 CAPSULE BY MOUTH EVERY MORNING 90 capsule 0  . gabapentin (NEURONTIN) 300 MG capsule Take 600-1,200 mg by mouth at bedtime.    Marland Kitchen ibuprofen (ADVIL) 200 MG tablet Take 400 mg by mouth every 6 (six) hours as needed (body aches).    Marland Kitchen lisinopril (ZESTRIL) 40 MG tablet Take 1 tablet (40 mg total) by mouth daily. 30 tablet 6  . metoprolol succinate (TOPROL XL) 25 MG 24 hr tablet Take 2 tablets (50 mg total) by mouth daily. (Patient taking differently: Take 50 mg by mouth daily as needed. ) 180 tablet 3  . montelukast (SINGULAIR) 10 MG tablet Take 10 mg by mouth daily as needed.    Marland Kitchen omeprazole (PRILOSEC) 20 MG capsule Take 20 mg by mouth daily as needed.    . pregabalin (LYRICA) 100 MG capsule Take 1 capsule (100 mg total) by mouth 3 (three) times daily. 90 capsule 5  . rOPINIRole (REQUIP) 1 MG tablet Take 1-2 mg by mouth at bedtime as needed (restless leg syndrome). 1 tablet after lunch and 2 tablet at bedtime    . sildenafil (REVATIO) 20 MG tablet Take 40-100 mg by mouth daily as needed (ED).     . traMADol (ULTRAM) 50 MG tablet Take 50 mg by mouth at bedtime as needed for pain.      No facility-administered medications prior to visit.    PAST MEDICAL HISTORY: Past Medical History:  Diagnosis Date  . Allergic rhinitis   . Anxiety   . Asthma    as a child  . Chronic low back pain   . Complication of anesthesia    "hard to put asleep, hard to wake up, nausea and vomiting"  . Depression   . ED (erectile dysfunction)   . GERD (gastroesophageal reflux disease)   . HLD (hyperlipidemia)   . HTN (hypertension)    sees Dr. [name], [facility]  . Hypertrophy of prostate with urinary obstruction and other lower urinary tract symptoms (LUTS)   . IBS (irritable bowel syndrome)   . Inguinal hernia without  mention of obstruction or gangrene, unilateral or unspecified, (not specified as recurrent)   . Insomnia   . Lumbar herniated disc    L7  . Morbid obesity ([place])   . Narcotic addiction ([place])   . NICM (nonischemic cardiomyopathy) ([facility])    tachycardia mediated,  resolved with sinus  . Persistent atrial fibrillation ([place])    ablation done 03/2006 at [facility]  . Pneumonia    hx of 2004  . PONV (postoperative nausea and vomiting)   . Twitching    legs    PAST SURGICAL HISTORY: Past Surgical History:  Procedure Laterality Date  . ATRIAL ABLATION SURGERY  2007   [facility]  . CARDIOVERSION  08/11/2011   Procedure: CARDIOVERSION;  Surgeon: Lelon Perla, MD;  Location: [place];  Service: Cardiovascular;  Laterality: N/A;  . CARDIOVERSION N/A 04/16/2017   Procedure: CARDIOVERSION;  Surgeon: Sanda Klein, MD;  Location: [facility]COPY;  Service: Cardiovascular;  Laterality: N/A;  . CARDIOVERSION N/A 03/04/2019   Procedure: CARDIOVERSION;  Surgeon: Lelon Perla, MD;  Location: [facility] ENDOSCOPY;  Service: Cardiovascular;  Laterality: N/A;  . CARDIOVERSION N/A 07/04/2019   Procedure: CARDIOVERSION;  Surgeon: [name],  MD;  Location: [place];  Service: Cardiovascular;  Laterality: N/A;  . EYE SURGERY     [place], 1998  . HERNIA REPAIR     1977  . INGUINAL HERNIA REPAIR Right 08/26/2012   Procedure: LAPAROSCOPIC INGUINAL HERNIA;  Surgeon: Madilyn Hook, DO;  Location: [place];  Service: General;  Laterality: Right;  laparoscopic right inguinal hernia repair with mesh, umbilical hernia repair  . INSERTION OF MESH Right 08/26/2012   Procedure: INSERTION OF MESH;  Surgeon: Madilyn Hook, DO;  Location: [place];  Service: General;  Laterality: Right;  right inguinal hernia  . TEE WITHOUT CARDIOVERSION N/A 03/04/2019   Procedure: TRANSESOPHAGEAL ECHOCARDIOGRAM (TEE);  Surgeon: Lelon Perla, MD;  Location: [place];  Service: Cardiovascular;  Laterality: N/A;  . UMBILICAL HERNIA REPAIR N/A  08/26/2012   Procedure: HERNIA REPAIR UMBILICAL ADULT;  Surgeon: Madilyn Hook, DO;  Location: [facility];  Service: General;  Laterality: N/A;    FAMILY HISTORY: Family History  Problem Relation Age of Onset  . Asthma Mother   . Breast cancer Mother   . Hypertension Mother   . COPD Father   . Prostate cancer Father   . Hypertension Father   . Lymphoma Sister     SOCIAL HISTORY: Social History   Socioeconomic History  . Marital status: Married    Spouse name: Not on file  . Number of children: 3  . Years of education: Not on file  . Highest education level: Not on file  Occupational History  . 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 Smoking status: Never Smoker  . Smokeless tobacco: Never Used  Vaping Use  . Vaping Use: Never used  Substance and Sexual Activity  . Alcohol use: No    Alcohol/week: 0.0 standard drinks  . Drug use: No  . Sexual activity: Yes  Other Topics Concern  . Not on file  Social History Narrative   Lives in [place] Determinants of Health   Financial Resource Strain:   . Difficulty of Paying Living Expenses: Not on file  Food Insecurity:   . Worried About Charity fundraiser in the Last Year: Not on file  . Ran Out of Food in the Last Year: Not on file  Transportation Needs:   . Lack of Transportation (Medical): Not on file  . Lack of Transportation (Non-Medical): Not on file  Physical Activity:   . Days of Exercise per Week: Not on file  . Minutes of Exercise per Session: Not on file  Stress:   . Feeling of Stress : Not on file  Social Connections:   . Frequency of Communication with Friends and Family: Not on file  . Frequency of Social Gatherings with Friends and Family: Not on file  . Attends Religious Services: Not on file  . Active Member of Clubs or Organizations: Not on file  . Attends Archivist Meetings: Not on file  . Marital Status: Not on file  Intimate Partner Violence:   . Fear of Current  or Ex-Partner: Not on file  . Emotionally Abused: Not on file  . Physically Abused: Not on file  . Sexually Abused: Not on file      PHYSICAL EXAM  There were no vitals filed for this visit. There is no height or weight on file to calculate BMI.  Generalized: Well developed, in no acute distress   Neurological examination  Mentation: Alert oriented to time, place, history taking. Follows all commands speech  and language fluent Cranial nerve II-XII: Pupils were equal round reactive to light. Extraocular movements were full, visual field were full on confrontational test. Facial sensation and strength were normal. Uvula tongue midline. Head turning and shoulder shrug  were normal and symmetric. Motor: The motor testing reveals 5 over 5 strength of all 4 extremities. Good symmetric motor tone is noted throughout.  Sensory: Sensory testing is intact to soft touch on all 4 extremities. No evidence of extinction is noted.  Coordination: Cerebellar testing reveals good finger-nose-finger and heel-to-shin bilaterally.  Gait and station: Gait is normal. Tandem gait is normal. Romberg is negative. No drift is seen.  Reflexes: Deep tendon reflexes are symmetric and normal bilaterally.   DIAGNOSTIC DATA (LABS, IMAGING, TESTING) - I reviewed patient records, labs, notes, testing and imaging myself where available.  Lab Results  Component Value Date   WBC 9.9 03/03/2019   HGB 10.9 (L) 03/03/2019   HCT 35.6 (L) 03/03/2019   MCV 77 (L) 03/03/2019   PLT 320 03/03/2019      Component Value Date/Time   NA 141 06/27/2019 1602   K 4.3 06/27/2019 1602   CL 99 06/27/2019 1602   CO2 23 06/27/2019 1602   GLUCOSE 85 06/27/2019 1602   GLUCOSE 100 (H) 05/02/2019 1137   BUN 25 06/27/2019 1602   CREATININE 1.60 (H) 06/27/2019 1602   CREATININE 1.10 11/15/2015 0847   CALCIUM 9.5 06/27/2019 1602   PROT 7.2 05/02/2019 1137   ALBUMIN 3.6 05/02/2019 1137   AST 45 (H) 05/02/2019 1137   ALT 50 (H)  05/02/2019 1137   ALKPHOS 93 05/02/2019 1137   BILITOT 0.5 05/02/2019 1137   GFRNONAA 45 (L) 06/27/2019 1602   GFRAA 53 (L) 06/27/2019 1602   Lab Results  Component Value Date   CHOL  02/14/2009    126        ATP III CLASSIFICATION:  <200     mg/dL   Desirable  200-239  mg/dL   Borderline High  >=240    mg/dL   High          HDL 48 02/14/2009   LDLCALC  02/14/2009    59        Total Cholesterol/HDL:CHD Risk Coronary Heart Disease Risk Table                     Men   Women  1/2 Average Risk   3.4   3.3  Average Risk       5.0   4.4  2 X Average Risk   9.6   7.1  3 X Average Risk  23.4   11.0        Use the calculated Patient Ratio above and the CHD Risk Table to determine the patient's CHD Risk.        ATP III CLASSIFICATION (LDL):  <100     mg/dL   Optimal  100-129  mg/dL   Near or Above                    Optimal  130-159  mg/dL   Borderline  160-189  mg/dL   High  >190     mg/dL   Very High   TRIG 95 02/14/2009   CHOLHDL 2.6 02/14/2009   No results found for: HGBA1C Lab Results  Component Value Date   VITAMINB12 749 01/16/2020   Lab Results  Component Value Date   TSH 0.942 01/16/2020  ASSESSMENT AND PLAN 64 y.o. year old male  has a past medical history of Allergic rhinitis, Anxiety, Asthma, Chronic low back pain, Complication of anesthesia, Depression, ED (erectile dysfunction), GERD (gastroesophageal reflux disease), HLD (hyperlipidemia), HTN (hypertension), Hypertrophy of prostate with urinary obstruction and other lower urinary tract symptoms (LUTS), IBS (irritable bowel syndrome), Inguinal hernia without mention of obstruction or gangrene, unilateral or unspecified, (not specified as recurrent), Insomnia, Lumbar herniated disc, Morbid obesity ([place]), Narcotic addiction ([place]), NICM (nonischemic cardiomyopathy) ([place]), Persistent atrial fibrillation ([place]), Pneumonia, PONV (postoperative nausea and vomiting), and Twitching. here with:  1.  Uncontrollable  body jerking movement 2.  Restless leg symptoms 3.  Risk for OSA -Referred for sleep study, he no showed -Ferritin level was low 28, on oral iron   I spent 15 minutes with the patient. 50% of this time was spent   Butler Denmark, [place], DNP 04/23/2020, 5:58 AM [facility] [address], [place], [place] [address] [phone]

## 2020-04-24 ENCOUNTER — Emergency Department (HOSPITAL_COMMUNITY): Payer: 59

## 2020-04-24 ENCOUNTER — Other Ambulatory Visit: Payer: Self-pay

## 2020-04-24 ENCOUNTER — Inpatient Hospital Stay (HOSPITAL_COMMUNITY)
Admission: EM | Admit: 2020-04-24 | Discharge: 2020-04-27 | DRG: 580 | Disposition: A | Discharge status: Home / Self Care | Payer: 59 | Attending: Internal Medicine | Admitting: Internal Medicine

## 2020-04-24 ENCOUNTER — Encounter (HOSPITAL_COMMUNITY): Payer: Self-pay | Admitting: Certified Registered Nurse Anesthetist

## 2020-04-24 ENCOUNTER — Encounter (HOSPITAL_COMMUNITY): Payer: Self-pay

## 2020-04-24 ENCOUNTER — Encounter (HOSPITAL_COMMUNITY)
Admission: EM | Disposition: A | Discharge status: Home / Self Care | Payer: Self-pay | Source: Home / Self Care | Attending: Internal Medicine

## 2020-04-24 ENCOUNTER — Inpatient Hospital Stay: Admit: 2020-04-24 | Payer: 59 | Admitting: Orthopedic Surgery

## 2020-04-24 DIAGNOSIS — L03114 Cellulitis of left upper limb: Secondary | ICD-10-CM | POA: Diagnosis present

## 2020-04-24 DIAGNOSIS — K219 Gastro-esophageal reflux disease without esophagitis: Secondary | ICD-10-CM | POA: Diagnosis present

## 2020-04-24 DIAGNOSIS — Z885 Allergy status to narcotic agent status: Secondary | ICD-10-CM | POA: Diagnosis not present

## 2020-04-24 DIAGNOSIS — R739 Hyperglycemia, unspecified: Secondary | ICD-10-CM | POA: Diagnosis present

## 2020-04-24 DIAGNOSIS — F32A Depression, unspecified: Secondary | ICD-10-CM | POA: Diagnosis present

## 2020-04-24 DIAGNOSIS — I5032 Chronic diastolic (congestive) heart failure: Secondary | ICD-10-CM | POA: Diagnosis present

## 2020-04-24 DIAGNOSIS — S52122K Displaced fracture of head of left radius, subsequent encounter for closed fracture with nonunion: Secondary | ICD-10-CM

## 2020-04-24 DIAGNOSIS — Z20822 Contact with and (suspected) exposure to covid-19: Secondary | ICD-10-CM | POA: Diagnosis present

## 2020-04-24 DIAGNOSIS — I4819 Other persistent atrial fibrillation: Secondary | ICD-10-CM | POA: Diagnosis present

## 2020-04-24 DIAGNOSIS — R8271 Bacteriuria: Secondary | ICD-10-CM | POA: Diagnosis present

## 2020-04-24 DIAGNOSIS — E669 Obesity, unspecified: Secondary | ICD-10-CM | POA: Diagnosis present

## 2020-04-24 DIAGNOSIS — A4901 Methicillin susceptible Staphylococcus aureus infection, unspecified site: Secondary | ICD-10-CM | POA: Diagnosis not present

## 2020-04-24 DIAGNOSIS — J45909 Unspecified asthma, uncomplicated: Secondary | ICD-10-CM | POA: Diagnosis present

## 2020-04-24 DIAGNOSIS — Z6834 Body mass index (BMI) 34.0-34.9, adult: Secondary | ICD-10-CM | POA: Diagnosis not present

## 2020-04-24 DIAGNOSIS — Z807 Family history of other malignant neoplasms of lymphoid, hematopoietic and related tissues: Secondary | ICD-10-CM

## 2020-04-24 DIAGNOSIS — E872 Acidosis: Secondary | ICD-10-CM | POA: Diagnosis present

## 2020-04-24 DIAGNOSIS — G2581 Restless legs syndrome: Secondary | ICD-10-CM | POA: Diagnosis present

## 2020-04-24 DIAGNOSIS — E876 Hypokalemia: Secondary | ICD-10-CM | POA: Diagnosis present

## 2020-04-24 DIAGNOSIS — Z23 Encounter for immunization: Secondary | ICD-10-CM

## 2020-04-24 DIAGNOSIS — Z825 Family history of asthma and other chronic lower respiratory diseases: Secondary | ICD-10-CM

## 2020-04-24 DIAGNOSIS — X58XXXD Exposure to other specified factors, subsequent encounter: Secondary | ICD-10-CM | POA: Diagnosis present

## 2020-04-24 DIAGNOSIS — F410 Panic disorder [episodic paroxysmal anxiety] without agoraphobia: Secondary | ICD-10-CM | POA: Diagnosis present

## 2020-04-24 DIAGNOSIS — E785 Hyperlipidemia, unspecified: Secondary | ICD-10-CM | POA: Diagnosis present

## 2020-04-24 DIAGNOSIS — L03119 Cellulitis of unspecified part of limb: Secondary | ICD-10-CM | POA: Diagnosis not present

## 2020-04-24 DIAGNOSIS — Z7901 Long term (current) use of anticoagulants: Secondary | ICD-10-CM

## 2020-04-24 DIAGNOSIS — I420 Dilated cardiomyopathy: Secondary | ICD-10-CM | POA: Diagnosis present

## 2020-04-24 DIAGNOSIS — Z1629 Resistance to other single specified antibiotic: Secondary | ICD-10-CM | POA: Diagnosis present

## 2020-04-24 DIAGNOSIS — R8281 Pyuria: Secondary | ICD-10-CM | POA: Diagnosis present

## 2020-04-24 DIAGNOSIS — L02414 Cutaneous abscess of left upper limb: Principal | ICD-10-CM | POA: Diagnosis present

## 2020-04-24 DIAGNOSIS — I10 Essential (primary) hypertension: Secondary | ICD-10-CM | POA: Diagnosis present

## 2020-04-24 DIAGNOSIS — R350 Frequency of micturition: Secondary | ICD-10-CM | POA: Diagnosis present

## 2020-04-24 DIAGNOSIS — Z79899 Other long term (current) drug therapy: Secondary | ICD-10-CM

## 2020-04-24 DIAGNOSIS — Z8042 Family history of malignant neoplasm of prostate: Secondary | ICD-10-CM

## 2020-04-24 DIAGNOSIS — I110 Hypertensive heart disease with heart failure: Secondary | ICD-10-CM | POA: Diagnosis present

## 2020-04-24 DIAGNOSIS — Z8249 Family history of ischemic heart disease and other diseases of the circulatory system: Secondary | ICD-10-CM

## 2020-04-24 DIAGNOSIS — W228XXA Striking against or struck by other objects, initial encounter: Secondary | ICD-10-CM | POA: Diagnosis present

## 2020-04-24 DIAGNOSIS — R7401 Elevation of levels of liver transaminase levels: Secondary | ICD-10-CM | POA: Diagnosis present

## 2020-04-24 DIAGNOSIS — B9562 Methicillin resistant Staphylococcus aureus infection as the cause of diseases classified elsewhere: Secondary | ICD-10-CM | POA: Diagnosis present

## 2020-04-24 DIAGNOSIS — F418 Other specified anxiety disorders: Secondary | ICD-10-CM | POA: Diagnosis not present

## 2020-04-24 DIAGNOSIS — Z803 Family history of malignant neoplasm of breast: Secondary | ICD-10-CM

## 2020-04-24 LAB — URINALYSIS, ROUTINE W REFLEX MICROSCOPIC
Bilirubin Urine: NEGATIVE
Glucose, UA: NEGATIVE mg/dL
Ketones, ur: NEGATIVE mg/dL
Nitrite: NEGATIVE
Protein, ur: NEGATIVE mg/dL
Specific Gravity, Urine: 1.013 (ref 1.005–1.030)
WBC, UA: >50 WBC/hpf — ABNORMAL HIGH (ref 0–5)
pH: 7 (ref 5.0–8.0)

## 2020-04-24 LAB — CBC WITH DIFFERENTIAL/PLATELET
Abs Immature Granulocytes: 0.06 10*3/uL (ref 0.00–0.07)
Basophils Absolute: 0.1 10*3/uL (ref 0.0–0.1)
Basophils Relative: 1 %
Eosinophils Absolute: 0.1 10*3/uL (ref 0.0–0.5)
Eosinophils Relative: 1 %
HCT: 43.8 % (ref 39.0–52.0)
Hemoglobin: 13.3 g/dL (ref 13.0–17.0)
Immature Granulocytes: 1 %
Lymphocytes Relative: 9 %
Lymphs Abs: 1.2 10*3/uL (ref 0.7–4.0)
MCH: 26.8 pg (ref 26.0–34.0)
MCHC: 30.4 g/dL (ref 30.0–36.0)
MCV: 88.3 fL (ref 80.0–100.0)
Monocytes Absolute: 0.9 10*3/uL (ref 0.1–1.0)
Monocytes Relative: 7 %
Neutro Abs: 11 10*3/uL — ABNORMAL HIGH (ref 1.7–7.7)
Neutrophils Relative %: 81 %
Platelets: 336 10*3/uL (ref 150–400)
RBC: 4.96 MIL/uL (ref 4.22–5.81)
RDW: 16.4 % — ABNORMAL HIGH (ref 11.5–15.5)
WBC: 13.3 10*3/uL — ABNORMAL HIGH (ref 4.0–10.5)
nRBC: 0 % (ref 0.0–0.2)

## 2020-04-24 LAB — COMPREHENSIVE METABOLIC PANEL
ALT: 68 U/L — ABNORMAL HIGH (ref 0–44)
AST: 28 U/L (ref 15–41)
Albumin: 3.3 g/dL — ABNORMAL LOW (ref 3.5–5.0)
Alkaline Phosphatase: 146 U/L — ABNORMAL HIGH (ref 38–126)
Anion gap: 9 (ref 5–15)
BUN: 17 mg/dL (ref 8–23)
CO2: 23 mmol/L (ref 22–32)
Calcium: 8.6 mg/dL — ABNORMAL LOW (ref 8.9–10.3)
Chloride: 106 mmol/L (ref 98–111)
Creatinine, Ser: 0.87 mg/dL (ref 0.61–1.24)
GFR, Estimated: >60 mL/min (ref >60)
Glucose, Bld: 86 mg/dL (ref 70–99)
Potassium: 4.3 mmol/L (ref 3.5–5.1)
Sodium: 138 mmol/L (ref 135–145)
Total Bilirubin: 1 mg/dL (ref 0.3–1.2)
Total Protein: 7.7 g/dL (ref 6.5–8.1)

## 2020-04-24 LAB — RESPIRATORY PANEL BY RT PCR (FLU A&B, COVID)
Influenza A by PCR: NEGATIVE
Influenza B by PCR: NEGATIVE
SARS Coronavirus 2 by RT PCR: NEGATIVE

## 2020-04-24 LAB — LACTIC ACID, PLASMA: Lactic Acid, Venous: 1 mmol/L (ref 0.5–1.9)

## 2020-04-24 SURGERY — IRRIGATION AND DEBRIDEMENT EXTREMITY
Anesthesia: Choice | Laterality: Left

## 2020-04-24 MED ORDER — TETANUS-DIPHTH-ACELL PERTUSSIS 5-2.5-18.5 LF-MCG/0.5 IM SUSP
0.5000 mL | Freq: Once | INTRAMUSCULAR | Status: AC
  Administered 2020-04-24: 0.5 mL via INTRAMUSCULAR
  Filled 2020-04-24: qty 0.5

## 2020-04-24 MED ORDER — ACETAMINOPHEN 650 MG RE SUPP: 650.0000 mg | Freq: Four times a day (QID) | RECTAL | Status: DC | PRN

## 2020-04-24 MED ORDER — ALBUTEROL SULFATE HFA 108 (90 BASE) MCG/ACT IN AERS: 1.0000 | INHALATION_SPRAY | Freq: Four times a day (QID) | RESPIRATORY_TRACT | Status: DC | PRN

## 2020-04-24 MED ORDER — ONDANSETRON HCL 4 MG/2ML IJ SOLN: 4.0000 mg | Freq: Four times a day (QID) | INTRAMUSCULAR | Status: DC | PRN

## 2020-04-24 MED ORDER — LISINOPRIL 20 MG PO TABS
40.0000 mg | ORAL_TABLET | Freq: Every day | ORAL | Status: DC
  Administered 2020-04-25 – 2020-04-27 (×3): 40 mg via ORAL
  Filled 2020-04-24 (×3): qty 2

## 2020-04-24 MED ORDER — ACETAMINOPHEN 325 MG PO TABS
650.0000 mg | ORAL_TABLET | Freq: Four times a day (QID) | ORAL | Status: DC | PRN
  Administered 2020-04-26: 650 mg via ORAL
  Filled 2020-04-24: qty 2

## 2020-04-24 MED ORDER — OXYCODONE HCL 5 MG PO TABS
5.0000 mg | ORAL_TABLET | ORAL | Status: DC | PRN
  Administered 2020-04-24 – 2020-04-26 (×9): 5 mg via ORAL
  Filled 2020-04-24 (×9): qty 1

## 2020-04-24 MED ORDER — MORPHINE SULFATE (PF) 2 MG/ML IV SOLN
2.0000 mg | INTRAVENOUS | Status: DC | PRN
  Administered 2020-04-25: 2 mg via INTRAVENOUS
  Filled 2020-04-24: qty 1

## 2020-04-24 MED ORDER — ALPRAZOLAM 0.5 MG PO TABS
0.5000 mg | ORAL_TABLET | Freq: Every evening | ORAL | Status: DC | PRN
  Administered 2020-04-25 (×2): 0.5 mg via ORAL
  Filled 2020-04-24 (×2): qty 1

## 2020-04-24 MED ORDER — LACTATED RINGERS IV SOLN: INTRAVENOUS | Status: AC

## 2020-04-24 MED ORDER — MONTELUKAST SODIUM 10 MG PO TABS: 10.0000 mg | ORAL_TABLET | Freq: Every day | ORAL | Status: DC

## 2020-04-24 MED ORDER — VANCOMYCIN HCL 2000 MG/400ML IV SOLN
2000.0000 mg | Freq: Once | INTRAVENOUS | Status: AC
  Administered 2020-04-24: 2000 mg via INTRAVENOUS
  Filled 2020-04-24: qty 400

## 2020-04-24 MED ORDER — LIDOCAINE-EPINEPHRINE 2 %-1:100000 IJ SOLN
30.0000 mL | Freq: Once | INTRAMUSCULAR | Status: AC
  Administered 2020-04-24: 30 mL
  Filled 2020-04-24: qty 2

## 2020-04-24 MED ORDER — ONDANSETRON HCL 4 MG PO TABS
4.0000 mg | ORAL_TABLET | Freq: Four times a day (QID) | ORAL | Status: DC | PRN
  Administered 2020-04-26: 4 mg via ORAL
  Filled 2020-04-24: qty 1

## 2020-04-24 MED ORDER — VANCOMYCIN HCL 1250 MG/250ML IV SOLN
1250.0000 mg | Freq: Two times a day (BID) | INTRAVENOUS | Status: DC
  Administered 2020-04-25 – 2020-04-27 (×5): 1250 mg via INTRAVENOUS
  Filled 2020-04-24 (×5): qty 250

## 2020-04-24 MED ORDER — DILTIAZEM HCL ER COATED BEADS 180 MG PO CP24
360.0000 mg | ORAL_CAPSULE | Freq: Every morning | ORAL | Status: DC
  Administered 2020-04-25 – 2020-04-27 (×3): 360 mg via ORAL
  Filled 2020-04-24 (×3): qty 2

## 2020-04-24 MED ORDER — SODIUM CHLORIDE 0.9% FLUSH
3.0000 mL | Freq: Two times a day (BID) | INTRAVENOUS | Status: DC
  Administered 2020-04-24 – 2020-04-27 (×5): 3 mL via INTRAVENOUS

## 2020-04-24 MED ORDER — PREGABALIN 50 MG PO CAPS
100.0000 mg | ORAL_CAPSULE | Freq: Three times a day (TID) | ORAL | Status: DC
  Filled 2020-04-24 (×3): qty 2

## 2020-04-24 MED ORDER — APIXABAN 5 MG PO TABS
5.0000 mg | ORAL_TABLET | Freq: Two times a day (BID) | ORAL | Status: DC
  Administered 2020-04-25: 5 mg via ORAL
  Filled 2020-04-24: qty 1

## 2020-04-24 MED ORDER — APIXABAN 5 MG PO TABS: 5.0000 mg | ORAL_TABLET | Freq: Two times a day (BID) | ORAL | Status: DC

## 2020-04-24 MED ORDER — SODIUM CHLORIDE 0.9 % IV SOLN
2.0000 g | INTRAVENOUS | Status: DC
  Administered 2020-04-24 – 2020-04-25 (×2): 2 g via INTRAVENOUS
  Filled 2020-04-24 (×2): qty 20

## 2020-04-24 MED ORDER — SENNOSIDES-DOCUSATE SODIUM 8.6-50 MG PO TABS: 1.0000 | ORAL_TABLET | Freq: Every evening | ORAL | Status: DC | PRN

## 2020-04-24 NOTE — ED Provider Notes (Signed)
[facility] DEPT Provider Note   CSN: [id number] Arrival date & time: 04/24/20  1730     History Chief Complaint  Patient presents with  . Wound Infection    LASHUN MCCANTS is a 64 y.o. male. Presents to ER with concern for wound infection. He reports 1 week ago aerator fell onto his left elbow, he noted mild amount of pain but did not think that he had any major injuries. Over the last day however he has noted significant increase in swelling, redness of the elbow. Noted small amount of pus leaking from the area of swelling. Went to urgent care, recommended to come to ER for eval. He reports he may have injured his left elbow couple years ago.  HPI     Past Medical History:  Diagnosis Date  . Allergic rhinitis   . Anxiety   . Asthma    as a child  . Chronic low back pain   . Complication of anesthesia    "hard to put asleep, hard to wake up, nausea and vomiting"  . Depression   . ED (erectile dysfunction)   . GERD (gastroesophageal reflux disease)   . HLD (hyperlipidemia)   . HTN (hypertension)    sees Dr. [name], [facility]  . Hypertrophy of prostate with urinary obstruction and other lower urinary tract symptoms (LUTS)   . IBS (irritable bowel syndrome)   . Inguinal hernia without mention of obstruction or gangrene, unilateral or unspecified, (not specified as recurrent)   . Insomnia   . Lumbar herniated disc    L7  . Morbid obesity ([place])   . Narcotic addiction ([place])   . NICM (nonischemic cardiomyopathy) (HCC)    tachycardia mediated,  resolved with sinus  . Persistent atrial fibrillation ([place])    ablation done 03/2006 at [facility]  . Pneumonia    hx of 2004  . PONV (postoperative nausea and vomiting)   . Twitching    legs    Patient Active Problem List   Diagnosis Date Noted  . Abscess of left forearm 04/24/2020  . Restless leg syndrome 01/16/2020  . Sleep apnea 01/16/2020  . Abnormal movement 01/16/2020  .  Persistent atrial fibrillation ([place]) 05/02/2019  . Acquired thrombophilia ([place]) 05/02/2019  . Jerking movements of extremities 03/24/2019  . Anxiety 03/24/2019  . Morbid obesity ([place]) 03/03/2019  . Acute CHF ([place]) 03/03/2019  . Anticoagulated 02/09/2019  . Syncope 11/15/2015  . Encounter for therapeutic drug monitoring 08/02/2013  . Concussion 01/13/2013  . Fall 01/13/2013  . Multiple fractures of ribs of left side 01/13/2013  . Traumatic pneumothorax 01/13/2013  . Long term current use of anticoagulant 09/18/2010  . Moderate to severe mitral regurgitation 03/14/2009  . Dilated cardiomyopathy ([place]) 03/14/2009  . DEPRESSION 02/13/2009  . ALLERGIC RHINITIS 02/13/2009  . Hyperlipidemia 02/12/2009  . Essential hypertension 02/12/2009  . PAF (paroxysmal atrial fibrillation) ([place]) 02/12/2009    Past Surgical History:  Procedure Laterality Date  . ATRIAL ABLATION SURGERY  2007   [facility]  . CARDIOVERSION  08/11/2011   Procedure: CARDIOVERSION;  Surgeon: Lelon Perla, MD;  Location: [facility];  Service: Cardiovascular;  Laterality: N/A;  . CARDIOVERSION N/A 04/16/2017   Procedure: CARDIOVERSION;  Surgeon: Sanda Klein, MD;  Location: [facility] ENDOSCOPY;  Service: Cardiovascular;  Laterality: N/A;  . CARDIOVERSION N/A 03/04/2019   Procedure: CARDIOVERSION;  Surgeon: Lelon Perla, MD;  Location: [facility] ENDOSCOPY;  Service: Cardiovascular;  Laterality: N/A;  . CARDIOVERSION N/A 07/04/2019  Procedure: CARDIOVERSION;  Surgeon: [name], MD;  Location: [facility] ENDOSCOPY;  Service: Cardiovascular;  Laterality: N/A;  . EYE SURGERY     las[place], 1998  . HERNIA REPAIR     1977  . INGUINAL HERNIA REPAIR Right 08/26/2012   Procedure: LAPAROSCOPIC INGUINAL HERNIA;  Surgeon: Madilyn Hook, DO;  Location: [facility];  Service: General;  Laterality: Right;  laparoscopic right inguinal hernia repair with mesh, umbilical hernia repair  . INSERTION OF MESH Right 08/26/2012   Procedure: INSERTION OF MESH;  Surgeon:  Madilyn Hook, DO;  Location: [facility];  Service: General;  Laterality: Right;  right inguinal hernia  . TEE WITHOUT CARDIOVERSION N/A 03/04/2019   Procedure: TRANSESOPHAGEAL ECHOCARDIOGRAM (TEE);  Surgeon: Lelon Perla, MD;  Location: [facility];  Service: Cardiovascular;  Laterality: N/A;  . UMBILICAL HERNIA REPAIR N/A 08/26/2012   Procedure: HERNIA REPAIR UMBILICAL ADULT;  Surgeon: Madilyn Hook, DO;  Location: [facility] OR;  Service: General;  Laterality: N/A;       Family History  Problem Relation Age of Onset  . Asthma Mother   . Breast cancer Mother   . Hypertension Mother   . COPD Father   . Prostate cancer Father   . Hypertension Father   . Lymphoma Sister     Social History   Tobacco Use  . Smoking status: Never Smoker  . Smokeless tobacco: Never Used  Vaping Use  . Vaping Use: Never used  Substance Use Topics  . Alcohol use: No    Alcohol/week: 0.0 standard drinks  . Drug use: No    Home Medications Prior to Admission medications   Medication Sig Start Date End Date Taking? Authorizing Provider  ALPRAZolam (XANAX) 0.5 MG tablet TAKE 1 TABLET(0.5 MG) BY MOUTH AT BEDTIME AS NEEDED FOR ANXIETY OR SLEEP 02/07/20   Marcial Pacas, MD  apixaban (ELIQUIS) 5 MG TABS tablet Take 1 tablet (5 mg total) by mouth 2 (two) times daily. 07/21/19   Lelon Perla, MD  diltiazem (CARDIZEM CD) 360 MG 24 hr capsule TAKE 1 CAPSULE BY MOUTH EVERY MORNING 01/24/20   Lelon Perla, MD  gabapentin (NEURONTIN) 300 MG capsule Take 600-1,200 mg by mouth at bedtime. 11/08/19   [provider]  ibuprofen (ADVIL) 200 MG tablet Take 400 mg by mouth every 6 (six) hours as needed (body aches).    [provider]  lisinopril (ZESTRIL) 40 MG tablet Take 1 tablet (40 mg total) by mouth daily. 04/15/19   Lelon Perla, MD  metoprolol succinate (TOPROL XL) 25 MG 24 hr tablet Take 2 tablets (50 mg total) by mouth daily. Patient taking differently: Take 50 mg by mouth daily as needed.   04/18/19 04/17/20  Lelon Perla, MD  montelukast (SINGULAIR) 10 MG tablet Take 10 mg by mouth daily as needed. 01/05/20   [provider]  omeprazole (PRILOSEC) 20 MG capsule Take 20 mg by mouth daily as needed.    [provider]  pregabalin (LYRICA) 100 MG capsule Take 1 capsule (100 mg total) by mouth 3 (three) times daily. 01/16/20   Marcial Pacas, MD  rOPINIRole (REQUIP) 1 MG tablet Take 1-2 mg by mouth at bedtime as needed (restless leg syndrome). 1 tablet after lunch and 2 tablet at bedtime 02/24/19   [provider]  sildenafil (REVATIO) 20 MG tablet Take 40-100 mg by mouth daily as needed (ED).  01/03/19   [provider]  traMADol (ULTRAM) 50 MG tablet Take 50 mg by mouth at bedtime  as needed for pain.  06/25/19   [provider]    Allergies    Hydrocodone  Review of Systems   Review of Systems  Constitutional: Negative for chills and fever.  HENT: Negative for ear pain and sore throat.   Eyes: Negative for pain and visual disturbance.  Respiratory: Negative for cough and shortness of breath.   Cardiovascular: Negative for chest pain and palpitations.  Gastrointestinal: Negative for abdominal pain and vomiting.  Genitourinary: Negative for dysuria and hematuria.  Musculoskeletal: Positive for arthralgias. Negative for back pain.  Skin: Negative for color change and rash.  Neurological: Negative for seizures and syncope.  All other systems reviewed and are negative.   Physical Exam Updated Vital Signs BP (!) 183/105   Pulse (!) 116   Temp 98.8 F (37.1 C) (Oral)   Resp 16   Ht 6\' 4"  (1.93 m)   Wt 127.5 kg   SpO2 100%   BMI 34.20 kg/m   Physical Exam Vitals and nursing note reviewed.  Constitutional:      Appearance: He is well-developed.  HENT:     Head: Normocephalic and atraumatic.  Eyes:     Conjunctiva/sclera: Conjunctivae normal.  Cardiovascular:     Rate and Rhythm: Normal rate.     Pulses: Normal pulses.   Pulmonary:     Effort: Pulmonary effort is normal.  Abdominal:     Palpations: Abdomen is soft.     Tenderness: There is no abdominal tenderness.  Musculoskeletal:     Cervical back: Neck supple.     Comments: L arm: Swelling, induration, erythema over the left proximal forearm, area of induration is approximately 6 x 10 cm, no focal tenderness over the elbow joint, normal elbow and wrist range of motion  Skin:    General: Skin is warm and dry.     Comments: Erythema over left forearm as above  Neurological:     Mental Status: He is alert.    Media Information   Document Information  Photos  Left arm  04/24/2020 18:18  Attached To:  Hospital Encounter on 04/24/20  Source Information  Carlisle Cater, PA-C  [facility]     ED Results / Procedures / Treatments   Labs (all labs ordered are listed, but only abnormal results are displayed) Labs Reviewed  COMPREHENSIVE METABOLIC PANEL - Abnormal; Notable for the following components:      Result Value   Calcium 8.6 (*)    Albumin 3.3 (*)    ALT 68 (*)    Alkaline Phosphatase 146 (*)    All other components within normal limits  CBC WITH DIFFERENTIAL/PLATELET - Abnormal; Notable for the following components:   WBC 13.3 (*)    RDW 16.4 (*)    Neutro Abs 11.0 (*)    All other components within normal limits  URINALYSIS, ROUTINE W REFLEX MICROSCOPIC - Abnormal; Notable for the following components:   APPearance HAZY (*)    Hgb urine dipstick SMALL (*)    Leukocytes,Ua LARGE (*)    WBC, UA >50 (*)    Bacteria, UA MANY (*)    All other components within normal limits  CULTURE, BLOOD (ROUTINE X 2)  CULTURE, BLOOD (ROUTINE X 2)  RESPIRATORY PANEL BY RT PCR (FLU A&B, COVID)  LACTIC ACID, PLASMA  LACTIC ACID, PLASMA    EKG None  Radiology DG Elbow Complete Left  Result Date: 04/24/2020 CLINICAL DATA:  Arm infection.  Increased swelling. EXAM: LEFT ELBOW - COMPLETE 3+ VIEW  COMPARISON:  None. FINDINGS: There  is a fracture through the left radial neck which appears subacute or chronic with nonunion. No joint effusion. No subluxation or dislocation. 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 No acute bony abnormality. Electronically Signed   By: Rolm Baptise M.D.   On: 04/24/2020 19:07   DG Forearm Left  Result Date: 04/24/2020 CLINICAL DATA:  Infection, injury 1 week ago.  Swelling. EXAM: LEFT FOREARM - 2 VIEW COMPARISON:  Elbow and forearm series today FINDINGS: Chronic appearing left radial neck fracture with nonunion. No acute fracture, subluxation or dislocation. Soft tissues are intact. No radiopaque foreign body. IMPRESSION: Chronic appearing left radial neck fracture with nonunion. No acute bony abnormality. Electronically Signed   By: Rolm Baptise M.D.   On: 04/24/2020 19:08   DG Wrist Complete Left  Result Date: 04/24/2020 CLINICAL DATA:  Infection, injury 1 week ago, swelling EXAM: LEFT WRIST - COMPLETE 3+ VIEW COMPARISON:  None. FINDINGS: There is no evidence of fracture or dislocation. There is no evidence of arthropathy or other focal bone abnormality. Soft tissues are unremarkable. IMPRESSION: Negative. Electronically Signed   By: Rolm Baptise M.D.   On: 04/24/2020 19:08    Procedures Procedures (including critical care time)  Medications Ordered in ED Medications  Tdap (BOOSTRIX) injection 0.5 mL (0.5 mLs Intramuscular Given 04/24/20 2050)    ED Course  I have reviewed the triage vital signs and the nursing notes.  Pertinent labs & imaging results that were available during my care of the patient were reviewed by me and considered in my medical decision making (see chart for details).    MDM Rules/Calculators/A&P                         64 year old male presents to ER with concern for left forearm infection after injury from aerator last week.  On exam, suspect significant abscess, cellulitis.  Plain films with likely chronic radial  head fracture, patient did report having injury a couple years ago though he did not have x-rays performed at the time.  Labs noted for leukocytosis.  Reviewed case with Dr. Amedeo Plenty on-call for hand surgery.  He will take operating room tonight for incision and drainage.  Recommends holding antibiotics until he can obtain intraoperative culture.  Request admit to hospitalist service.  Reviewed with Dr. Posey Pronto, he will admit.   Final Clinical Impression(s) / ED Diagnoses Final diagnoses:  Abscess of forearm, left  Cellulitis of forearm    Rx / DC Orders ED Discharge Orders    None       Lucrezia Starch, MD 04/24/20 2110

## 2020-04-24 NOTE — H&P (Signed)
History and Physical    BRACKEN MOFFA HXT:[id number] DOB: 02/13/1956 DOA: 04/24/2020  PCP: [name], MD  Patient coming from: Home via urgent care  I have personally briefly reviewed patient's old medical records in [place]  Chief Complaint: Left forearm/elbow wound  HPI: [name] is a 64 y.o. male with medical history significant for persistent atrial fibrillation on Eliquis (failed prior ablation and multiple DC cardioversions), nonischemic dilated cardiomyopathy (felt tachycardia mediated, last EF 55-60% by TTE 01/19/2020), asthma, hypertension, hyperlipidemia, restless leg syndrome, and depression/anxiety who presents to the ED for evaluation of wound to his left forearm/elbow.  Patient states about 1 week ago he was helping load on equipment when he aerated fell onto his left elbow/forearm.  Initially he had mild pain however over the last couple days he noticed significantly increased swelling.  Last night he noticed erythema and purulent discharge from the area causing him to have a mini panic attack.  He was having significant pain and feelings of fevers, chills, diaphoresis at home and therefore went to urgent care for further evaluation earlier today.  He was subsequently sent to the ED for further management.  ED Course:  Initial vitals showed BP 181/108, pulse 100, RR 26, temp 98.8 Fahrenheit, SPO2 100% on room air.  Labs show WBC 13.3, hemoglobin 13.3, platelets 336,000, sodium 138, potassium 4.3, bicarb 23, BUN 17, creatinine 0.87, AST 28, ALT 68, alk phos 146, total bilirubin 1.0, lactic acid 1.0.  Blood culture was obtained and pending.  Urinalysis shows negative nitrites, large leukocytes, 0-5 RBC/hpf, >50 WBC/hpf, many bacteria microscopy.  SARS-CoV-2 PCR is collected and pending.  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  EDP consulted hand surgery, Dr. Amedeo Plenty  who recommended medical admission and will plan for I&D in the OR tonight.  The hospitalist service was consulted to admit for further evaluation and management.  Review of Systems: All systems reviewed and are negative except as documented in history of present illness above.   Past Medical History:  Diagnosis Date  . Allergic rhinitis   . Anxiety   . Asthma    as a child  . Chronic low back pain   . Complication of anesthesia    "hard to put asleep, hard to wake up, nausea and vomiting"  . Depression   . ED (erectile dysfunction)   . GERD (gastroesophageal reflux disease)   . HLD (hyperlipidemia)   . HTN (hypertension)    sees Dr. [name], [facility]  . Hypertrophy of prostate with urinary obstruction and other lower urinary tract symptoms (LUTS)   . IBS (irritable bowel syndrome)   . Inguinal hernia without mention of obstruction or gangrene, unilateral or unspecified, (not specified as recurrent)   . Insomnia   . Lumbar herniated disc    L7  . Morbid obesity ([place])   . Narcotic addiction ([place])   . NICM (nonischemic cardiomyopathy) (HCC)    tachycardia mediated,  resolved with sinus  . Persistent atrial fibrillation ([place])    [facility] 03/[facility]  . Pneumonia    hx of 2004  . PONV (postoperative nausea and vomiting)   . Twitching    legs    Past Surgical History:  Procedure Laterality Date  . ATRIAL ABLATION SURGERY  2007   [facility]  . CARDIOVERSION  08/11/2011   Procedure: CARDIOVERSION;  Surgeon: Lelon Perla, MD;  Location:  [place] OR;  Service: Cardiovascular;  Laterality: N/A;  . CARDIOVERSION N/A 04/16/2017   Procedure: CARDIOVERSION;  Surgeon: Sanda Klein, MD;  Location: [place] ENDOSCOPY;  Service: Cardiovascular;  Laterality: N/A;  . CARDIOVERSION N/A 03/04/2019   Procedure: CARDIOVERSION;  Surgeon: Lelon Perla, MD;  Location: [facility] ENDOSCOPY;  Service: Cardiovascular;  Laterality: N/A;  . CARDIOVERSION N/A 07/04/2019   Procedure:  CARDIOVERSION;  Surgeon: [name], MD;  Location: [facility] ENDOSCOPY;  Service: Cardiovascular;  Laterality: N/A;  . EYE SURGERY     [place], 1998  . HERNIA REPAIR     1977  . INGUINAL HERNIA REPAIR Right 08/26/2012   Procedure: LAPAROSCOPIC INGUINAL HERNIA;  Surgeon: Madilyn Hook, DO;  Location: [place];  Service: General;  Laterality: Right;  laparoscopic right inguinal hernia repair with mesh, umbilical hernia repair  . INSERTION OF MESH Right 08/26/2012   Procedure: INSERTION OF MESH;  Surgeon: Madilyn Hook, DO;  Location: [place];  Service: General;  Laterality: Right;  right inguinal hernia  . TEE WITHOUT CARDIOVERSION N/A 03/04/2019   Procedure: TRANSESOPHAGEAL ECHOCARDIOGRAM (TEE);  Surgeon: Lelon Perla, MD;  Location: [place];  Service: Cardiovascular;  Laterality: N/A;  . UMBILICAL HERNIA REPAIR N/A 08/26/2012   Procedure: HERNIA REPAIR UMBILICAL ADULT;  Surgeon: Madilyn Hook, DO;  Location: [place];  Service: General;  Laterality: N/A;    Social History:  reports that he has never smoked. He has never used smokeless tobacco. He reports that he does not drink alcohol and does not use drugs.  Allergies  Allergen Reactions  . Hydrocodone Other (See Comments)    GI upset, dizziness    Family History  Problem Relation Age of Onset  . Asthma Mother   . Breast cancer Mother   . Hypertension Mother   . COPD Father   . Prostate cancer Father   . Hypertension Father   . Lymphoma Sister      Prior to Admission medications   Medication Sig Start Date End Date Taking? Authorizing Provider  ALPRAZolam (XANAX) 0.5 MG tablet TAKE 1 TABLET(0.5 MG) BY MOUTH AT BEDTIME AS NEEDED FOR ANXIETY OR SLEEP 02/07/20   Marcial Pacas, MD  apixaban (ELIQUIS) 5 MG TABS tablet Take 1 tablet (5 mg total) by mouth 2 (two) times daily. 07/21/19   Lelon Perla, MD  diltiazem (CARDIZEM CD) 360 MG 24 hr capsule TAKE 1 CAPSULE BY MOUTH EVERY MORNING 01/24/20   Lelon Perla, MD  gabapentin  (NEURONTIN) 300 MG capsule Take 600-1,200 mg by mouth at bedtime. 11/08/19   [provider]  ibuprofen (ADVIL) 200 MG tablet Take 400 mg by mouth every 6 (six) hours as needed (body aches).    [provider]  lisinopril (ZESTRIL) 40 MG tablet Take 1 tablet (40 mg total) by mouth daily. 04/15/19   Lelon Perla, MD  metoprolol succinate (TOPROL XL) 25 MG 24 hr tablet Take 2 tablets (50 mg total) by mouth daily. Patient taking differently: Take 50 mg by mouth daily as needed.  04/18/19 04/17/20  Lelon Perla, MD  montelukast (SINGULAIR) 10 MG tablet Take 10 mg by mouth daily as needed. 01/05/20   [provider]  omeprazole (PRILOSEC) 20 MG capsule Take 20 mg by mouth daily as needed.    [provider]  pregabalin (LYRICA) 100 MG capsule Take 1 capsule (100 mg total) by mouth 3 (three) times daily. 01/16/20   Marcial Pacas, MD  rOPINIRole (REQUIP) 1 MG tablet Take 1-2 mg by mouth  at bedtime as needed (restless leg syndrome). 1 tablet after lunch and 2 tablet at bedtime 02/24/19   [provider]  sildenafil (REVATIO) 20 MG tablet Take 40-100 mg by mouth daily as needed (ED).  01/03/19   [provider]  traMADol (ULTRAM) 50 MG tablet Take 50 mg by mouth at bedtime as needed for pain.  06/25/19   [provider]    Physical Exam: Vitals:   04/24/20 1752 04/24/20 1753 04/24/20 1915  BP: (!) 181/108  (!) 183/105  Pulse: 100  (!) 116  Resp: (!) 26  16  Temp: 98.8 F (37.1 C)    TempSrc: Oral    SpO2: 100%  100%  Weight:  127.5 kg   Height:  _0  (1.93 m)    Constitutional: Resting supine in bed, being prepped for I&D at bedside Eyes: PERRL, lids and conjunctivae normal ENMT: Mucous membranes are moist. Posterior pharynx clear of any exudate or lesions.Normal dentition.  Neck: normal, supple, no masses. Respiratory: clear to auscultation anteriorly.  Normal respiratory effort. No accessory muscle use.  Cardiovascular:  Irregularly irregular, no murmurs / rubs / gallops. No extremity edema. 2+ pedal pulses. Abdomen: no tenderness, no masses palpated. Bowel sounds positive.  Musculoskeletal: Swelling erythema to left forearm/elbow, Dr. Amedeo Plenty with hand surgery at bedside performing I&D with removal of necrotic tissue. Skin: Erythematous swollen left elbow/forearm, undergoing I&D at bedside at time of my evaluation. Neurologic: CN 2-12 grossly intact. Sensation intact, strength of left upper extremity not assessed due to undergoing I&D.  Strength otherwise intact. Psychiatric: Normal judgment and insight. Alert and oriented x 3. Normal mood.       Labs on Admission: I have personally reviewed following labs and imaging studies  CBC: Recent Labs  Lab 04/24/20 1908  WBC 13.3*  NEUTROABS 11.0*  HGB 13.3  HCT 43.8  MCV 88.3  PLT 791   Basic Metabolic Panel: Recent Labs  Lab 04/24/20 1908  NA 138  K 4.3  CL 106  CO2 23  GLUCOSE 86  BUN 17  CREATININE 0.87  CALCIUM 8.6*   GFR: Estimated Creatinine Clearance: 128.4 mL/min (by C-G formula based on SCr of 0.87 mg/dL). Liver Function Tests: Recent Labs  Lab 04/24/20 1908  AST 28  ALT 68*  ALKPHOS 146*  BILITOT 1.0  PROT 7.7  ALBUMIN 3.3*   No results for input(s): LIPASE, AMYLASE in the last 168 hours. No results for input(s): AMMONIA in the last 168 hours. Coagulation Profile: No results for input(s): INR, PROTIME in the last 168 hours. Cardiac Enzymes: No results for input(s): CKTOTAL, CKMB, CKMBINDEX, TROPONINI in the last 168 hours. BNP (last 3 results) No results for input(s): PROBNP in the last 8760 hours. HbA1C: No results for input(s): HGBA1C in the last 72 hours. CBG: No results for input(s): GLUCAP in the last 168 hours. Lipid Profile: No results for input(s): CHOL, HDL, LDLCALC, TRIG, CHOLHDL, LDLDIRECT in the last 72 hours. Thyroid Function Tests: No results for input(s): TSH, T4TOTAL, FREET4, T3FREE, THYROIDAB in the  last 72 hours. Anemia Panel: No results for input(s): VITAMINB12, FOLATE, FERRITIN, TIBC, IRON, RETICCTPCT in the last 72 hours. Urine analysis:    Component Value Date/Time   COLORURINE YELLOW 04/24/2020 2035   APPEARANCEUR HAZY (A) 04/24/2020 2035   LABSPEC 1.013 04/24/2020 2035   PHURINE 7.0 04/24/2020 2035   GLUCOSEU NEGATIVE 04/24/2020 2035   HGBUR SMALL (A) 04/24/2020 2035   [place] 04/24/2020 2035   [place] 04/24/2020 2035  PROTEINUR NEGATIVE 04/24/2020 2035   UROBILINOGEN 0.2 01/13/2013 1246   NITRITE NEGATIVE 04/24/2020 2035   LEUKOCYTESUR LARGE (A) 04/24/2020 2035    Radiological Exams on Admission: DG Elbow Complete Left  Result Date: 04/24/2020 CLINICAL DATA:  Arm infection.  Increased swelling. EXAM: LEFT ELBOW - COMPLETE 3+ VIEW COMPARISON:  None. FINDINGS: There is a fracture through the left radial neck which appears subacute or chronic with nonunion. No joint effusion. No subluxation or dislocation. 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 No acute bony abnormality. Electronically Signed   By: Rolm Baptise M.D.   On: 04/24/2020 19:07   DG Forearm Left  Result Date: 04/24/2020 CLINICAL DATA:  Infection, injury 1 week ago.  Swelling. EXAM: LEFT FOREARM - 2 VIEW COMPARISON:  Elbow and forearm series today FINDINGS: Chronic appearing left radial neck fracture with nonunion. No acute fracture, subluxation or dislocation. Soft tissues are intact. No radiopaque foreign body. IMPRESSION: Chronic appearing left radial neck fracture with nonunion. No acute bony abnormality. Electronically Signed   By: Rolm Baptise M.D.   On: 04/24/2020 19:08   DG Wrist Complete Left  Result Date: 04/24/2020 CLINICAL DATA:  Infection, injury 1 week ago, swelling EXAM: LEFT WRIST - COMPLETE 3+ VIEW COMPARISON:  None. FINDINGS: There is no evidence of fracture or dislocation. There is no evidence of arthropathy or other focal  bone abnormality. Soft tissues are unremarkable. IMPRESSION: Negative. Electronically Signed   By: Rolm Baptise M.D.   On: 04/24/2020 19:08    EKG: Ordered and pending.  Assessment/Plan Principal Problem:   Abscess of left forearm Active Problems:   Hyperlipidemia   Anxiety with depression   Essential hypertension   Persistent atrial fibrillation (HCC)  LUBY SEAMANS is a 64 y.o. male with medical history significant for persistent atrial fibrillation on Eliquis (failed prior ablation and multiple DC cardioversions), nonischemic dilated cardiomyopathy (felt tachycardia mediated, last EF 55-60% by TTE 01/19/2020), asthma, hypertension, hyperlipidemia, restless leg syndrome, and depression/anxiety who is admitted with an abscess of his left forearm/elbow.  Abscess of left forearm/elbow: Appreciate hand surgery assistance.  Dr. Amedeo Plenty at bedside performing I&D, cultures obtained and sent to lab. -Start empiric IV vancomycin and ceftriaxone -Follow wound cultures and narrow antibiotics as able -Pain control with analgesics as needed -Start IV fluid hydration overnight  Persistent atrial fibrillation: S/p ablation and multiple failed cardioversions.  CHA2DS2-VASc is 2, he has been on Eliquis for anticoagulation and diltiazem for rate control.  Follows with cardiology, Dr. Stanford Breed.  Slight tachycardia related to wound infection and pain. -Eliquis held tonight for I&D, plan to resume in 24 hours -Resume home diltiazem 360 mg daily -Monitor on telemetry overnight  Nonischemic dilated cardiomyopathy: Previously felt tachycardia mediated.  Subsequent echocardiograms showed improvement with last EF 55-60% by TTE 01/19/2020.  No evidence of volume overload at time of admission.  Hypertension: Resume home diltiazem and lisinopril.  Hyperlipidemia: Not currently on statin therapy.  Asthma: Stable without acute exacerbation.  Continue Singulair and albuterol if  needed.  Anxiety: Continue Xanax 0.5 mg nightly if needed.  DVT prophylaxis: Resume home Eliquis tomorrow Code Status: Full code Family Communication: Discussed with patient, no family at bedside. Disposition Plan: From home and likely discharge to home after receiving at least 24 hours IV antibiotics, further cultural data, and when cleared by hand surgery. Consults called: Hand surgery, Dr. Amedeo Plenty Admission status:  Status is: Inpatient  Remains inpatient appropriate because:IV treatments appropriate due to intensity of  illness or inability to take PO and Inpatient level of care appropriate due to severity of illness   Dispo: The patient is from: Home              Anticipated d/c is to: Home              Anticipated d/c date is: 3 days              Patient currently is not medically stable to d/c.   Zada Finders MD [facility]  If 7PM-7AM, please contact night-coverage [email or web address]  04/24/2020, 9:11 PM

## 2020-04-24 NOTE — ED Notes (Signed)
Wound cultures collected from Left Forearm by Dr. Amedeo Plenty. Lab made aware of same.

## 2020-04-24 NOTE — Progress Notes (Signed)
Pharmacy Antibiotic Note  KYREESE CHIO is a 64 y.o. male admitted on 04/24/2020 with left arm abscess.   PT hit in the elbow and forearm by lawn irrigator and now has erythema cellulitic change high white count and an obvious abscess.  Pharmacy has been consulted for vancomycin dosing.  Plan: Vancomycin 2gm IV x 1 then 1250mg  IV q12h Ceftriaxone 2gm IV q24h per MD Follow renal function, cultures and clinical course  Height: 6\' 4"  (193 cm) Weight: 127.5 kg (281 lb) IBW/kg (Calculated) : 86.8  Temp (24hrs), Avg:98.8 F (37.1 C), Min:98.8 F (37.1 C), Max:98.8 F (37.1 C)  Recent Labs  Lab 04/24/20 1908  WBC 13.3*  CREATININE 0.87  LATICACIDVEN 1.0    Estimated Creatinine Clearance: 128.4 mL/min (by C-G formula based on SCr of 0.87 mg/dL).    Allergies  Allergen Reactions  . Hydrocodone Other (See Comments)    GI upset, dizziness    Antimicrobials this admission: 10/19 ceftriaxone >> 10/19 vanc >> Dose adjustments this admission:   Microbiology results: 10/19 BCx:  10/19 wound :  Thank you for allowing pharmacy to be a part of this patient's care.  Dolly Rias RPh 04/24/2020, 10:19 PM

## 2020-04-24 NOTE — ED Triage Notes (Signed)
Pt was helping load some lawn equipment apprx 1 wk ago when a spreader fell on his left forarm/elbow area. Was swollen following the accident, pt iced arm. Pt woke up today and arm had increased swelling, after soaking his arm pus began leaking from a small opening in area. Pt has increased respirations, and HR. Pt is visibly upset, and has anxiety surrounding this issue.

## 2020-04-24 NOTE — ED Notes (Signed)
Coming from [facility]-states had a puncture wound about 1 week ago-now site is red, swollen and hard to touch-states patient is been having fevers on and off, tachycardic-sending patient from scanning, labs and IV antibiotics
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
## 2020-04-24 NOTE — Anesthesia Preprocedure Evaluation (Deleted)
Anesthesia Evaluation    Reviewed: Allergy & Precautions, Patient's Chart, lab work & pertinent test results  History of Anesthesia Complications Negative for: history of anesthetic complications  Airway        Dental   Pulmonary neg pulmonary ROS,           Cardiovascular hypertension, Pt. on medications and Pt. on home beta blockers +CHF  + dysrhythmias Atrial Fibrillation      Neuro/Psych Anxiety Depression negative neurological ROS     GI/Hepatic GERD  ,(+)     substance abuse  ,   Endo/Other  negative endocrine ROS  Renal/GU negative Renal ROS  negative genitourinary   Musculoskeletal negative musculoskeletal ROS (+) narcotic dependent  Abdominal   Peds  Hematology negative hematology ROS (+)   Anesthesia Other Findings   Reproductive/Obstetrics                            Anesthesia Physical Anesthesia Plan  ASA: III and emergent  Anesthesia Plan:    Post-op Pain Management:    Induction:   PONV Risk Score and Plan:   Airway Management Planned:   Additional Equipment:   Intra-op Plan:   Post-operative Plan:   Informed Consent:   Plan Discussed with:   Anesthesia Plan Comments:         Anesthesia Quick Evaluation

## 2020-04-25 ENCOUNTER — Encounter (HOSPITAL_COMMUNITY): Payer: Self-pay | Admitting: Internal Medicine

## 2020-04-25 DIAGNOSIS — I10 Essential (primary) hypertension: Secondary | ICD-10-CM

## 2020-04-25 DIAGNOSIS — L02414 Cutaneous abscess of left upper limb: Secondary | ICD-10-CM | POA: Diagnosis not present

## 2020-04-25 DIAGNOSIS — F418 Other specified anxiety disorders: Secondary | ICD-10-CM

## 2020-04-25 DIAGNOSIS — I4819 Other persistent atrial fibrillation: Secondary | ICD-10-CM

## 2020-04-25 DIAGNOSIS — E785 Hyperlipidemia, unspecified: Secondary | ICD-10-CM | POA: Diagnosis not present

## 2020-04-25 DIAGNOSIS — K219 Gastro-esophageal reflux disease without esophagitis: Secondary | ICD-10-CM

## 2020-04-25 LAB — HEPATIC FUNCTION PANEL
ALT: 52 U/L — ABNORMAL HIGH (ref 0–44)
AST: 22 U/L (ref 15–41)
Albumin: 3 g/dL — ABNORMAL LOW (ref 3.5–5.0)
Alkaline Phosphatase: 124 U/L (ref 38–126)
Bilirubin, Direct: 0.1 mg/dL (ref 0.0–0.2)
Indirect Bilirubin: 0.7 mg/dL (ref 0.3–0.9)
Total Bilirubin: 0.8 mg/dL (ref 0.3–1.2)
Total Protein: 7.1 g/dL (ref 6.5–8.1)

## 2020-04-25 LAB — BASIC METABOLIC PANEL
Anion gap: 13 (ref 5–15)
BUN: 16 mg/dL (ref 8–23)
CO2: 20 mmol/L — ABNORMAL LOW (ref 22–32)
Calcium: 8.5 mg/dL — ABNORMAL LOW (ref 8.9–10.3)
Chloride: 106 mmol/L (ref 98–111)
Creatinine, Ser: 0.91 mg/dL (ref 0.61–1.24)
GFR, Estimated: >60 mL/min (ref >60)
Glucose, Bld: 94 mg/dL (ref 70–99)
Potassium: 3.8 mmol/L (ref 3.5–5.1)
Sodium: 139 mmol/L (ref 135–145)

## 2020-04-25 LAB — HIV ANTIBODY (ROUTINE TESTING W REFLEX): HIV Screen 4th Generation wRfx: NONREACTIVE

## 2020-04-25 LAB — GLUCOSE, CAPILLARY: Glucose-Capillary: 104 mg/dL — ABNORMAL HIGH (ref 70–99)

## 2020-04-25 LAB — CBC
HCT: 43.5 % (ref 39.0–52.0)
Hemoglobin: 13.4 g/dL (ref 13.0–17.0)
MCH: 27.1 pg (ref 26.0–34.0)
MCHC: 30.8 g/dL (ref 30.0–36.0)
MCV: 87.9 fL (ref 80.0–100.0)
Platelets: 334 10*3/uL (ref 150–400)
RBC: 4.95 MIL/uL (ref 4.22–5.81)
RDW: 16.4 % — ABNORMAL HIGH (ref 11.5–15.5)
WBC: 12.5 10*3/uL — ABNORMAL HIGH (ref 4.0–10.5)
nRBC: 0 % (ref 0.0–0.2)

## 2020-04-25 MED ORDER — LABETALOL HCL 5 MG/ML IV SOLN
10.0000 mg | INTRAVENOUS | Status: DC | PRN
  Administered 2020-04-25: 10 mg via INTRAVENOUS
  Filled 2020-04-25: qty 4

## 2020-04-25 MED ORDER — METOPROLOL TARTRATE 5 MG/5ML IV SOLN
2.5000 mg | Freq: Once | INTRAVENOUS | Status: AC
  Administered 2020-04-25: 2.5 mg via INTRAVENOUS
  Filled 2020-04-25: qty 5

## 2020-04-25 MED ORDER — PANTOPRAZOLE SODIUM 40 MG PO TBEC
40.0000 mg | DELAYED_RELEASE_TABLET | Freq: Every day | ORAL | Status: DC
  Administered 2020-04-25 – 2020-04-27 (×3): 40 mg via ORAL
  Filled 2020-04-25 (×3): qty 1

## 2020-04-25 MED ORDER — METOPROLOL SUCCINATE ER 50 MG PO TB24
50.0000 mg | ORAL_TABLET | Freq: Every day | ORAL | Status: DC
  Administered 2020-04-25 – 2020-04-27 (×3): 50 mg via ORAL
  Filled 2020-04-25 (×3): qty 1

## 2020-04-25 NOTE — Plan of Care (Signed)
  Problem: Education: Goal: Knowledge of General Education information will improve Description: Including pain rating scale, medication(s)/side effects and non-pharmacologic comfort measures Outcome: Progressing   Problem: Health Behavior/Discharge Planning: Goal: Ability to manage health-related needs will improve Outcome: Progressing   Problem: Clinical Measurements: Goal: Ability to maintain clinical measurements within normal limits will improve Outcome: Progressing Goal: Will remain free from infection Outcome: Progressing Goal: Diagnostic test results will improve Outcome: Progressing Goal: Respiratory complications will improve Outcome: Progressing Goal: Cardiovascular complication will be avoided Outcome: Progressing   Problem: Activity: Goal: Risk for activity intolerance will decrease Outcome: Progressing   Problem: Nutrition: Goal: Adequate nutrition will be maintained Outcome: Progressing   Problem: Coping: Goal: Level of anxiety will decrease Outcome: Progressing   Problem: Elimination: Goal: Will not experience complications related to bowel motility Outcome: Progressing   Problem: Pain Managment: Goal: General experience of comfort will improve Outcome: Progressing   Problem: Safety: Goal: Ability to remain free from injury will improve Outcome: Progressing   Problem: Skin Integrity: Goal: Risk for impaired skin integrity will decrease Outcome: Progressing   Problem: Clinical Measurements: Goal: Postoperative complications will be avoided or minimized Outcome: Progressing   Problem: Skin Integrity: Goal: Demonstration of wound healing without infection will improve Outcome: Progressing

## 2020-04-25 NOTE — Progress Notes (Signed)
PT Cancellation Note  Patient Details Name: ALBERTUS CHIARELLI MRN: [id number] DOB: February 18, 1956   Cancelled Treatment:    [facility] therapy order noted to start 04/26/20.  Claretha Cooper 04/25/2020, 8:48 AM  [place] Pager [phone] Office [phone]

## 2020-04-25 NOTE — TOC Progression Note (Signed)
Transition of Care [facility]) - Progression Note    Patient Details  Name: [name] MRN: [id number] Date of Birth: 20-Jan-1956  [facility] CM/SW Contact  Purcell Mouton, RN Phone Number: 04/25/2020, 11:26 AM  Clinical Narrative:     Pt from home with wife. TOC will continue to follow.   Expected Discharge Plan: Home/Self Care Barriers to Discharge: No Barriers Identified  Expected Discharge Plan and Services Expected Discharge Plan: Home/Self Care       Living arrangements for the past 2 months: Single Family Home                                       Social Determinants of Health (SDOH) Interventions    Readmission Risk Interventions No flowsheet data found.

## 2020-04-25 NOTE — Progress Notes (Signed)
PROGRESS NOTE    [name]  EVO:[id number] DOB: [birth year] DOA: 04/24/2020 PCP: [name], MD   Brief Narrative:  HPI per Dr. Zada Finders on 04/24/20 [name] is a 64 y.o. male with medical history significant for persistent atrial fibrillation on Eliquis (failed prior ablation and multiple DC cardioversions), nonischemic dilated cardiomyopathy (felt tachycardia mediated, last EF 55-60% by TTE 01/19/2020), asthma, hypertension, hyperlipidemia, restless leg syndrome, and depression/anxiety who presents to the ED for evaluation of wound to his left forearm/elbow.  Patient states about 1 week ago he was helping load on equipment when he aerated fell onto his left elbow/forearm.  Initially he had mild pain however over the last couple days he noticed significantly increased swelling.  Last night he noticed erythema and purulent discharge from the area causing him to have a mini panic attack.  He was having significant pain and feelings of fevers, chills, diaphoresis at home and therefore went to urgent care for further evaluation earlier today.  He was subsequently sent to the ED for further management.  ED Course:  Initial vitals showed BP 181/108, pulse 100, RR 26, temp 98.8 Fahrenheit, SPO2 100% on room air.  Labs show WBC 13.3, hemoglobin 13.3, platelets 336,000, sodium 138, potassium 4.3, bicarb 23, BUN 17, creatinine 0.87, AST 28, ALT 68, alk phos 146, total bilirubin 1.0, lactic acid 1.0.  Blood culture was obtained and pending.  Urinalysis shows negative nitrites, large leukocytes, 0-5 RBC/hpf, >50 WBC/hpf, many bacteria microscopy.  SARS-CoV-2 PCR is collected and pending.  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  EDP consulted hand surgery, Dr. Amedeo Plenty who recommended medical admission and will plan for I&D in the OR tonight.  The hospitalist service was consulted to admit for  further evaluation and management.  **Interim History  Patient underwent irrigation debridement of the deep abscess in the left forearm along with osteotomy with exploration of the muscular tissue of the left forearm.  He is postoperative day 1 and currently remains on IV antibiotics.  Gram stain shown gram-positive cocci in pairs and clusters with sensitivities still pending.  We will continue empiric antibiotics until culture results and de-escalate as appropriately and transition to p.o.  Assessment & Plan:   Principal Problem:   Abscess of left forearm Active Problems:   Hyperlipidemia   Anxiety with depression   Essential hypertension   Persistent atrial fibrillation (HCC)  Abscess of Left Forearm/Elbow -Appreciate hand surgery assistance.  Dr. Amedeo Plenty at bedside performing I&D, cultures obtained and sent to lab.  -Initial Gram Stain showing Moderate WBC present, Predominatntly PMN and Few Gram Positive Cocci in pairs in Clusters with Cx Report pending -Start Empiric IV Vancomycin and Ceftriaxone and will continue until Sensitivity Results -Follow wound cultures and narrow antibiotics as able; Blood Cx x2 showed NGTD <12 Hours -Pain control with analgesics as needed; On po Acetaminophen 650 mg po/RC q6hprn Mild Pain/Fever, Oxycodone 5 mg q4hprn Moderate Pain and IV Morphine 2 mg IV q4hprn Severe -Started IV fluid hydration overnight but this has now stopped -Hydrotherapy to begin in the AM -C/w Supportive Care and with Antiemetics with po/IV Zofran q6hpnr Nausea  Persistent Atrial Fibrillation -S/p ablation and multiple failed cardioversions.   -CHA2DS2-VASc is 2, he has been on Eliquis for anticoagulation and diltiazem for rate control. -Follows with cardiology, Dr. Stanford Breed.  Slight tachycardia related to wound infection and pain and HR ranging from 61-119 with last  read of 109 -Eliquis held last night for I&D, plan to resume in 24 hours and was resumed this AM  -Resume home  Diltiazem 360 mg daily and Metoprolol Succinate 50 mg po Daily  -Continue to Monitor on Telemetry   Nonischemic Dilated Cardiomyopathy -Previously felt tachycardia mediated.   -Subsequent echocardiograms showed improvement with last EF 55-60% by TTE 01/19/2020.   -No evidence of volume overload at time of admission. -C/w Lisinopril and Metoprolol Succinate as below   Hypertension -Resumed home Diltiazem 360 mg po Daily and Lisinopril 40 mg po Daily -Takes Metoprolol Succinate 50 mg po DailyPRN but will schedule given his elevated HR and BP -Continue to Monitor BP per Protocol -Last BP was 183/137 -Added Labetalol 10 mg IV q2hprn for SBP<180 or DBP>100  Hyperlipidemia -Not currently on statin therapy.  Asthma -Stable without acute exacerbation.   -Continue Montelukast 10 mg po qHS and Albuterol IH 1-2 puff q6h if needed for Wheezing and SOB.  Anxiety -Continue Alprazolam 0.5 mg nightly if needed.  Metabolic Acidosis -Mild. Patient's CO2 is now 20, Chloride Level is 106, AG is 13 -On Admission his CO2 was 23, AG was 9, and Chloride Level was 106 -Continue to Monitor and Trend and if Necessary will place on IVF  Elevated ALT -Improving and Likely Reactive -ALT went from 68 -> 52 -Continue to Monitor and Trend and if Necessary will obtain a RUQ U/S and Acute Hepatitis Panel -Repeat CMP in the AM   GERD -Continue with PPI with Pantoprazole 40 mg p.o. daily  Obesity -Complicates overall prognosis and Care -Estimated body mass index is 34.21 kg/m as calculated from the following:   Height as of this encounter: _0  (1.93 m).   Weight as of this encounter: 127.5 kg. -Weight Loss and Dietary Counseling given  Asymptomatic Bacteriuria/Pyuria -Urinalysis showed hazy appearance with yellow color urine, small hemoglobin, large leukocytes, negative nitrites, many bacteria, 0-5 RBCs per high-power field, greater than 50 WBCs -Patient does not have any burning or discomfort or  any flank pain but states that he was having some urinary frequency -Empirically covered with his antibiotics as above but clinically do not suspect UTI  DVT prophylaxis: Anticoagulated with Apixaban 5 mg po BID Code Status: FULL CODE Family Communication: Discussed with wife at bedside Disposition Plan: Pending Ortho Clearance and Cx Sensitivity Results   Status is: Inpatient  Remains inpatient appropriate because:Unsafe d/c plan, IV treatments appropriate due to intensity of illness or inability to take PO and Inpatient level of care appropriate due to severity of illness   Dispo: The patient is from: Home              Anticipated d/c is to: Home              Anticipated d/c date is:1-2 days              Patient currently is not medically stable to d/c.  Consultants:   Orthopedic Hand Surgery Dr. Roseanne Kaufman  Procedures:  Operative procedure #1 irrigation debridement deep abscess left forearm #2 fasciotomy with exploration of the muscular tissue left forearm Done by Dr. Amedeo Plenty POD1  Antimicrobials:  Anti-infectives (From admission, onward)   Start     Dose/Rate Route Frequency Ordered Stop   04/25/20 1000  vancomycin (VANCOREADY) IVPB 1250 mg/250 mL        1,250 mg 166.7 mL/hr over 90 Minutes Intravenous Every 12 hours 04/24/20 2220     04/24/20 2215  vancomycin (VANCOREADY) IVPB  2000 mg/400 mL        2,000 mg 200 mL/hr over 120 Minutes Intravenous  Once 04/24/20 2204 04/25/20 0044   04/24/20 2200  cefTRIAXone (ROCEPHIN) 2 g in sodium chloride 0.9 % 100 mL IVPB        2 g 200 mL/hr over 30 Minutes Intravenous Every 24 hours 04/24/20 2153         Subjective: Seen and examined at bedside and states the pain in his arm is doing much better.  He states he is feeling better and was wanting to go home.  No chest pain, lightheadedness or dizziness.  No nausea or vomiting.  No other concerns or complaints at this time.  Objective: Vitals:   04/24/20 2352 04/25/20 0358  04/25/20 0732 04/25/20 0932  BP: (!) 179/97 (!) 161/118 (!) 193/141 (!) 183/137  Pulse: (!) 101 80 (!) 119 (!) 109  Resp:  20 16   Temp:  98 F (36.7 C) 97.7 F (36.5 C)   TempSrc:  Oral Oral   SpO2:  97% 98%   Weight:      Height:        Intake/Output Summary (Last 24 hours) at 04/25/2020 1129 Last data filed at 04/25/2020 8657 Gross per 24 hour  Intake 1170.65 ml  Output 1305 ml  Net -134.35 ml   Filed Weights   04/24/20 1753 04/24/20 2339  Weight: 127.5 kg 127.5 kg   Examination: Physical Exam:  Constitutional: WN/WD obese Caucasian male currently in NAD and appears calm and comfortable Eyes: Lids and conjunctivae normal, sclerae anicteric  ENMT: External Ears, Nose appear normal. Grossly normal hearing.  Neck: Appears normal, supple, no cervical masses, normal ROM, no appreciable thyromegaly; no JVD Respiratory: Diminished to auscultation bilaterally, no wheezing, rales, rhonchi or crackles. Normal respiratory effort and patient is not tachypenic. No accessory muscle use.  Cardiovascular: Irregularly Irregular and mildly tachycardic, no murmurs / rubs / gallops. S1 and S2 auscultated. No extremity edema.  Abdomen: Soft, non-tender, Distended 2/2 to body habitus. Bowel sounds positive x4.  GU: Deferred. Musculoskeletal: No clubbing / cyanosis of digits/nails.  Left arm is wrapped in an Ace bandage from his surgical procedure Skin: No rashes, lesions, ulcers on limited skin evaluation. No induration; Warm and dry.  Neurologic: CN 2-12 grossly intact with no focal deficits. Romberg sign and cerebellar reflexes not assessed.  Psychiatric: Normal judgment and insight. Alert and oriented x 3. Normal mood and appropriate affect.   Data Reviewed: I have personally reviewed following labs and imaging studies  CBC: Recent Labs  Lab 04/24/20 1908 04/25/20 0432  WBC 13.3* 12.5*  NEUTROABS 11.0*  --   HGB 13.3 13.4  HCT 43.8 43.5  MCV 88.3 87.9  PLT 336 846   Basic  Metabolic Panel: Recent Labs  Lab 04/24/20 1908 04/25/20 0432  NA 138 139  K 4.3 3.8  CL 106 106  CO2 23 20*  GLUCOSE 86 94  BUN 17 16  CREATININE 0.87 0.91  CALCIUM 8.6* 8.5*   GFR: Estimated Creatinine Clearance: 122.7 mL/min (by C-G formula based on SCr of 0.91 mg/dL). Liver Function Tests: Recent Labs  Lab 04/24/20 1908 04/25/20 0432  AST 28 22  ALT 68* 52*  ALKPHOS 146* 124  BILITOT 1.0 0.8  PROT 7.7 7.1  ALBUMIN 3.3* 3.0*   No results for input(s): LIPASE, AMYLASE in the last 168 hours. No results for input(s): AMMONIA in the last 168 hours. Coagulation Profile: No results for input(s): INR, PROTIME in  the last 168 hours. Cardiac Enzymes: No results for input(s): CKTOTAL, CKMB, CKMBINDEX, TROPONINI in the last 168 hours. BNP (last 3 results) No results for input(s): PROBNP in the last 8760 hours. HbA1C: No results for input(s): HGBA1C in the last 72 hours. CBG: Recent Labs  Lab 04/25/20 0931  GLUCAP 104*   Lipid Profile: No results for input(s): CHOL, HDL, LDLCALC, TRIG, CHOLHDL, LDLDIRECT in the last 72 hours. Thyroid Function Tests: No results for input(s): TSH, T4TOTAL, FREET4, T3FREE, THYROIDAB in the last 72 hours. Anemia Panel: No results for input(s): VITAMINB12, FOLATE, FERRITIN, TIBC, IRON, RETICCTPCT in the last 72 hours. Sepsis Labs: Recent Labs  Lab 04/24/20 1908  LATICACIDVEN 1.0    Recent Results (from the past 240 hour(s))  Blood culture (routine x 2)     Status: None (Preliminary result)   Collection Time: 04/24/20  7:08 PM   Specimen: BLOOD  Result Value Ref Range Status   Specimen Description   Final    BLOOD RIGHT ANTECUBITAL Performed at [facility] [address], [place], [place] [address]    Special Requests   Final    BOTTLES DRAWN AEROBIC AND ANAEROBIC Blood Culture adequate volume Performed at [facility] [address] [place], [place] [address]    Culture   Final    NO  GROWTH < 12 HOURS Performed at [facility] [address] [place], [place] [address]    Report Status PENDING  Incomplete  Respiratory Panel by RT PCR (Flu A&B, Covid) - Nasopharyngeal Swab     Status: None   Collection Time: 04/24/20  8:41 PM   Specimen: Nasopharyngeal Swab  Result Value Ref Range Status   SARS Coronavirus 2 by RT PCR NEGATIVE NEGATIVE Final    Comment: (NOTE) SARS-CoV-2 target nucleic acids are NOT DETECTED.  The SARS-CoV-2 RNA is generally detectable in upper respiratoy specimens during the acute phase of infection. The lowest concentration of SARS-CoV-2 viral copies this assay can detect is 131 copies/mL. A negative result does not preclude SARS-Cov-2 infection and should not be used as the sole basis for treatment or other patient management decisions. A negative result may occur with  improper specimen collection/handling, submission of specimen other than nasopharyngeal swab, presence of viral mutation(s) within the areas targeted by this assay, and inadequate number of viral copies (<131 copies/mL). A negative result must be combined with clinical observations, patient history, and epidemiological information. The expected result is Negative.  Fact Sheet for Patients:  PinkCheek.be  Fact Sheet for Healthcare Providers:  GravelBags.it  This test is no t yet approved or cleared by the [place] FDA and  has been authorized for detection and/or diagnosis of SARS-CoV-2 by FDA under an Emergency Use Authorization (EUA). This EUA will remain  in effect (meaning this test can be used) for the duration of the COVID-19 declaration under Section 564(b)(1) of the Act, 21 U.S.C. section 360bbb-3(b)(1), unless the authorization is terminated or revoked sooner.     Influenza A by PCR NEGATIVE NEGATIVE Final   Influenza B by PCR NEGATIVE NEGATIVE Final    Comment: (NOTE) The Xpert Xpress  SARS-CoV-2/FLU/RSV assay is intended as an aid in  the diagnosis of influenza from Nasopharyngeal swab specimens and  should not be used as a sole basis for treatment. Nasal washings and  aspirates are unacceptable for Xpert Xpress SARS-CoV-2/FLU/RSV  testing.  Fact Sheet for Patients: PinkCheek.be  Fact Sheet for Healthcare Providers: GravelBags.it  This test  is not yet approved or cleared by the [place] and  has been authorized for detection and/or diagnosis of SARS-CoV-2 by  FDA under an Emergency Use Authorization (EUA). This EUA will remain  in effect (meaning this test can be used) for the duration of the  Covid-19 declaration under Section 564(b)(1) of the Act, 21  U.S.C. section 360bbb-3(b)(1), unless the authorization is  terminated or revoked. Performed at [facility], [place] [address] [place], [place] [address]   Aerobic/Anaerobic Culture (surgical/deep wound)     Status: None (Preliminary result)   Collection Time: 04/24/20  9:43 PM   Specimen: BLOOD LEFT FOREARM; Abscess  Result Value Ref Range Status   Specimen Description   Final    BLOOD LEFT FOREARM Performed at [facility] [address] [place], [place] [address]    Special Requests   Final    NONE Performed at [facility], [place] [address] [place], [place] [address]    Gram Stain   Final    MODERATE WBC PRESENT, PREDOMINANTLY PMN FEW GRAM POSITIVE COCCI IN PAIRS IN CLUSTERS Performed at [facility], [place] [address], [place], [place] [address]    Culture PENDING  Incomplete   Report Status PENDING  Incomplete  Blood culture (routine x 2)     Status: None (Preliminary result)   Collection Time: 04/24/20 10:46 PM   Specimen: BLOOD  Result Value Ref Range Status   Specimen Description   Final    BLOOD BLOOD RIGHT FOREARM Performed at [facility]  [address] [place], [place] [address]    Special Requests   Final    BOTTLES DRAWN AEROBIC AND ANAEROBIC Blood Culture adequate volume Performed at [facility] [address], [place], [place] [address]    Culture   Final    NO GROWTH < 12 HOURS Performed at [facility] [address], [place], [place] [address]    Report Status PENDING  Incomplete     RN Pressure Injury Documentation:     Estimated body mass index is 34.21 kg/m as calculated from the following:   Height as of this encounter: _0  (1.93 m).   Weight as of this encounter: 127.5 kg.  Malnutrition Type:  Malnutrition Characteristics:  Nutrition Interventions:   Radiology Studies: DG Elbow Complete Left  Result Date: 04/24/2020 CLINICAL DATA:  Arm infection.  Increased swelling. EXAM: LEFT ELBOW - COMPLETE 3+ VIEW COMPARISON:  None. FINDINGS: There is a fracture through the left radial neck which appears subacute or chronic with nonunion. No joint effusion. No subluxation or dislocation. 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 No acute bony abnormality. Electronically Signed   By: Rolm Baptise M.D.   On: 04/24/2020 19:07   DG Forearm Left  Result Date: 04/24/2020 CLINICAL DATA:  Infection, injury 1 week ago.  Swelling. EXAM: LEFT FOREARM - 2 VIEW COMPARISON:  Elbow and forearm series today FINDINGS: Chronic appearing left radial neck fracture with nonunion. No acute fracture, subluxation or dislocation. Soft tissues are intact. No radiopaque foreign body. IMPRESSION: Chronic appearing left radial neck fracture with nonunion. No acute bony abnormality. Electronically Signed   By: Rolm Baptise M.D.   On: 04/24/2020 19:08   DG Wrist Complete Left  Result Date: 04/24/2020 CLINICAL DATA:  Infection, injury 1 week ago, swelling EXAM: LEFT WRIST - COMPLETE 3+ VIEW COMPARISON:  None. FINDINGS: There is  no evidence of fracture or dislocation. There  is no evidence of arthropathy or other focal bone abnormality. Soft tissues are unremarkable. IMPRESSION: Negative. Electronically Signed   By: Rolm Baptise M.D.   On: 04/24/2020 19:08   Scheduled Meds:  apixaban  5 mg Oral BID   diltiazem  360 mg Oral q morning - 10a   lisinopril  40 mg Oral Daily   metoprolol succinate  50 mg Oral Daily   montelukast  10 mg Oral QHS   pantoprazole  40 mg Oral Daily   pregabalin  100 mg Oral TID   sodium chloride flush  3 mL Intravenous Q12H   Continuous Infusions:  cefTRIAXone (ROCEPHIN)  IV 2 g (04/24/20 2242)   vancomycin      LOS: 1 day   Kerney Elbe, DO [facility] PAGER is on AMION  If 7PM-7AM, please contact night-coverage [email or web address]

## 2020-04-25 NOTE — Plan of Care (Signed)
Care plan initiated and discussed with the patient. 

## 2020-04-25 NOTE — Progress Notes (Signed)
Pt refused to take Lyrica he said his PCP took him off this medicine.

## 2020-04-26 DIAGNOSIS — L02414 Cutaneous abscess of left upper limb: Secondary | ICD-10-CM | POA: Diagnosis not present

## 2020-04-26 DIAGNOSIS — E785 Hyperlipidemia, unspecified: Secondary | ICD-10-CM | POA: Diagnosis not present

## 2020-04-26 DIAGNOSIS — A4901 Methicillin susceptible Staphylococcus aureus infection, unspecified site: Secondary | ICD-10-CM

## 2020-04-26 DIAGNOSIS — F418 Other specified anxiety disorders: Secondary | ICD-10-CM | POA: Diagnosis not present

## 2020-04-26 DIAGNOSIS — I10 Essential (primary) hypertension: Secondary | ICD-10-CM | POA: Diagnosis not present

## 2020-04-26 LAB — CBC WITH DIFFERENTIAL/PLATELET
Abs Immature Granulocytes: 0.07 10*3/uL (ref 0.00–0.07)
Basophils Absolute: 0.1 10*3/uL (ref 0.0–0.1)
Basophils Relative: 1 %
Eosinophils Absolute: 0.2 10*3/uL (ref 0.0–0.5)
Eosinophils Relative: 2 %
HCT: 43.4 % (ref 39.0–52.0)
Hemoglobin: 13.5 g/dL (ref 13.0–17.0)
Immature Granulocytes: 1 %
Lymphocytes Relative: 15 %
Lymphs Abs: 1.6 10*3/uL (ref 0.7–4.0)
MCH: 27.2 pg (ref 26.0–34.0)
MCHC: 31.1 g/dL (ref 30.0–36.0)
MCV: 87.3 fL (ref 80.0–100.0)
Monocytes Absolute: 1 10*3/uL (ref 0.1–1.0)
Monocytes Relative: 9 %
Neutro Abs: 7.7 10*3/uL (ref 1.7–7.7)
Neutrophils Relative %: 72 %
Platelets: 376 10*3/uL (ref 150–400)
RBC: 4.97 MIL/uL (ref 4.22–5.81)
RDW: 16.5 % — ABNORMAL HIGH (ref 11.5–15.5)
WBC: 10.6 10*3/uL — ABNORMAL HIGH (ref 4.0–10.5)
nRBC: 0 % (ref 0.0–0.2)

## 2020-04-26 LAB — COMPREHENSIVE METABOLIC PANEL
ALT: 45 U/L — ABNORMAL HIGH (ref 0–44)
AST: 19 U/L (ref 15–41)
Albumin: 3.1 g/dL — ABNORMAL LOW (ref 3.5–5.0)
Alkaline Phosphatase: 130 U/L — ABNORMAL HIGH (ref 38–126)
Anion gap: 10 (ref 5–15)
BUN: 14 mg/dL (ref 8–23)
CO2: 21 mmol/L — ABNORMAL LOW (ref 22–32)
Calcium: 8.4 mg/dL — ABNORMAL LOW (ref 8.9–10.3)
Chloride: 106 mmol/L (ref 98–111)
Creatinine, Ser: 0.92 mg/dL (ref 0.61–1.24)
GFR, Estimated: >60 mL/min (ref >60)
Glucose, Bld: 106 mg/dL — ABNORMAL HIGH (ref 70–99)
Potassium: 3.6 mmol/L (ref 3.5–5.1)
Sodium: 137 mmol/L (ref 135–145)
Total Bilirubin: 0.7 mg/dL (ref 0.3–1.2)
Total Protein: 7.4 g/dL (ref 6.5–8.1)

## 2020-04-26 LAB — PHOSPHORUS: Phosphorus: 3.8 mg/dL (ref 2.5–4.6)

## 2020-04-26 LAB — MAGNESIUM: Magnesium: 2.2 mg/dL (ref 1.7–2.4)
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
## 2020-04-26 NOTE — Progress Notes (Addendum)
PROGRESS NOTE    [name]  TOI:[id number] DOB: [birth year] DOA: 04/24/2020 PCP: [name], MD   Brief Narrative:  HPI per Dr. Zada Finders on 04/24/20 [name] is a 64 y.o. male with medical history significant for persistent atrial fibrillation on Eliquis (failed prior ablation and multiple DC cardioversions), nonischemic dilated cardiomyopathy (felt tachycardia mediated, last EF 55-60% by TTE 01/19/2020), asthma, hypertension, hyperlipidemia, restless leg syndrome, and depression/anxiety who presents to the ED for evaluation of wound to his left forearm/elbow.  Patient states about 1 week ago he was helping load on equipment when he aerated fell onto his left elbow/forearm.  Initially he had mild pain however over the last couple days he noticed significantly increased swelling.  Last night he noticed erythema and purulent discharge from the area causing him to have a mini panic attack.  He was having significant pain and feelings of fevers, chills, diaphoresis at home and therefore went to urgent care for further evaluation earlier today.  He was subsequently sent to the ED for further management.  ED Course:  Initial vitals showed BP 181/108, pulse 100, RR 26, temp 98.8 Fahrenheit, SPO2 100% on room air.  Labs show WBC 13.3, hemoglobin 13.3, platelets 336,000, sodium 138, potassium 4.3, bicarb 23, BUN 17, creatinine 0.87, AST 28, ALT 68, alk phos 146, total bilirubin 1.0, lactic acid 1.0.  Blood culture was obtained and pending.  Urinalysis shows negative nitrites, large leukocytes, 0-5 RBC/hpf, >50 WBC/hpf, many bacteria microscopy.  SARS-CoV-2 PCR is collected and pending.  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  EDP consulted hand surgery, Dr. Amedeo Plenty who recommended medical admission and will plan for I&D in the OR tonight.  The hospitalist service was consulted to admit for  further evaluation and management.  **Interim History  Patient underwent irrigation debridement of the deep abscess in the left forearm along with osteotomy with exploration of the muscular tissue of the left forearm.  He is postoperative day 1 and currently remains on IV antibiotics.  Gram stain shown gram-positive cocci in pairs and clusters with sensitivities still pending.  We will continue empiric antibiotics until culture results and de-escalate as appropriately and transition to p.o.  04/26/2020: Cultures are growing out staph aureus however sensitivities are still pending.  Orthopedic surgery evaluated and patient had another hematoma and still has Eliquis has been held for now and I discussed the case with cardiology PA who recommends holding off for now not transition to heparin drip but resuming it once as able and cleared by Ortho.  Patient to undergo hydrotherapy today.  We will continue IV ceftriaxone and vancomycin for now and de-escalate once sensitivities are resulted and may discuss with ID about further antibiotic duration course.  Assessment & Plan:   Principal Problem:   Abscess of left forearm Active Problems:   Hyperlipidemia   Anxiety with depression   Essential hypertension   Persistent atrial fibrillation (HCC)  Abscess of Left Forearm/Elbow status post I&D postoperative day 2 -Appreciate hand surgery assistance.  Dr. Amedeo Plenty at bedside performing I&D, cultures obtained and sent to lab.  -Initial Gram Stain showing Moderate WBC present, Predominatntly PMN and Few Gram Positive Cocci in pairs in Clusters; Gram stain showing rare Staph aureus with sensitivity still pending -Start Empiric IV Vancomycin and Ceftriaxone and will continue until Sensitivity Results -Follow wound cultures and narrow antibiotics as able; Blood Cx x2 showed NGTD <12 Hours  still and pending to be updated -Pain control with analgesics as needed; On po Acetaminophen 650 mg po/RC q6hprn Mild  Pain/Fever, Oxycodone 5 mg q4hprn Moderate Pain and IV Morphine 2 mg IV q4hprn Severe -Started IV fluid hydration overnight but this has now stopped -Hydrotherapy to begin today -WBC is trending down and went from 13.3 -> 12.5 -> 10.6 -C/w Supportive Care and with Antiemetics with po/IV Zofran q6hpnr Nausea -Orthopedic surgery evaluated today and removed his back and decompressed some hematoma formation again and because the patient has some bleeding and hematoma formation Dr. Amedeo Plenty has discontinued his Eliquis for now and recommending holding it for least 2 to 3 days until the wound is able to stabilize; I discussed with the cardiology PA today and they recommended resuming as soon as possible this patient was going to get an outpatient ablation or cardioversion eventually. -We will need orthopedic surgery clearance prior to discharge  Persistent Atrial Fibrillation -S/p ablation and multiple failed cardioversions.   -CHA2DS2-VASc is 2, he has been on Eliquis for anticoagulation and diltiazem for rate control. -Follows with cardiology, Dr. Stanford Breed.  Slight tachycardia related to wound infection and pain and HR ranging from 61-119 with last read of 109 -Eliquis held initially for I&D and was resumed yesterday but because the patient had some hematoma formation and bleeding this was discontinued and orthopedic surgery recommending holding it for least 2 to 3 days and allowing the wound to stabilize -Resume home Diltiazem 360 mg daily and Metoprolol Succinate 50 mg po Daily  -Had a cardiology appointment back in January and had a repeat attempted cardioversion on amiodarone which was unsuccessful; he is to be referred to CVTS for consider patient a surgical maze -Continue to Monitor on Telemetry   Nonischemic Dilated Cardiomyopathy and felt to be tachycardia mediated Chronic diastolic CHF -Previously felt tachycardia mediated.   -Subsequent echocardiograms showed improvement with last EF 55-60%  by TTE 01/19/2020.   -No evidence of volume overload at time of admission. -C/w Lisinopril and Metoprolol Succinate as below; does not appear to be taking Lasix anymore  Hypertension -Resumed home Diltiazem 360 mg po Daily and Lisinopril 40 mg po Daily -Takes Metoprolol Succinate 50 mg po DailyPRN but will schedule given his elevated HR and BP -Continue to Monitor BP per Protocol -Last BP was 142/97 -Added Labetalol 10 mg IV q2hprn for SBP<180 or DBP>100  Hyperlipidemia -Not currently on statin therapy.  Asthma -Stable without acute exacerbation.   -Continue Montelukast 10 mg po qHS and Albuterol IH 1-2 puff q6h if needed for Wheezing and SOB.  Anxiety -Continue Alprazolam 0.5 mg nightly if needed.  Metabolic Acidosis -Mild. Patient's CO2 is now 21, chloride level is 106, and anion gap is 10 -On Admission his CO2 was 23, AG was 9, and Chloride Level was 106 -Continue to Monitor and Trend and if Necessary will place on IVF  Elevated ALT -Improving and Likely Reactive -ALT went from 68 -> 52 and is further trending down to 45 -Continue to Monitor and Trend and if Necessary will obtain a RUQ U/S and Acute Hepatitis Panel -Repeat CMP in the AM   GERD -Continue with PPI with Pantoprazole 40 mg p.o. daily  Obesity -Complicates overall prognosis and Care -Estimated body mass index is 34.21 kg/m as calculated from the following:   Height as of this encounter: 6' 4"  (1.93 m).   Weight as of this encounter: 127.5 kg. -Weight Loss and Dietary Counseling given  Asymptomatic Bacteriuria/Pyuria -Urinalysis showed hazy appearance  with yellow color urine, small hemoglobin, large leukocytes, negative nitrites, many bacteria, 0-5 RBCs per high-power field, greater than 50 WBCs -Patient does not have any burning or discomfort or any flank pain but states that he was having some urinary frequency -Empirically covered with his antibiotics as above but clinically do not suspect  UTI  Hyperglycemia -Patient's blood sugar was little elevated this morning and likely reactive on CMP as it was 106; random blood sugar is 104 -Check hemoglobin A1c in the a.m. to rule out diabetes -Continue to monitor and trend blood sugars carefully if necessary and will add sensitive NovoLog sliding scale insulin  DVT prophylaxis: Anticoagulated with Apixaban 5 mg po BID but currently being held now Code Status: FULL CODE Family Communication: Discussed with wife at bedside Disposition Plan: Pending Ortho Clearance and Cx Sensitivity Results   Status is: Inpatient  Remains inpatient appropriate because:Unsafe d/c plan, IV treatments appropriate due to intensity of illness or inability to take PO and Inpatient level of care appropriate due to severity of illness   Dispo: The patient is from: Home              Anticipated d/c is to: Home              Anticipated d/c date is:1-2 days              Patient currently is not medically stable to d/c.  Consultants:   Orthopedic Hand Surgery Dr. Roseanne Kaufman  Procedures:  Operative procedure #1 irrigation debridement deep abscess left forearm #2 fasciotomy with exploration of the muscular tissue left forearm Done by Dr. Amedeo Plenty POD2  Antimicrobials:  Anti-infectives (From admission, onward)   Start     Dose/Rate Route Frequency Ordered Stop   04/25/20 1000  vancomycin (VANCOREADY) IVPB 1250 mg/250 mL        1,250 mg 166.7 mL/hr over 90 Minutes Intravenous Every 12 hours 04/24/20 2220     04/24/20 2215  vancomycin (VANCOREADY) IVPB 2000 mg/400 mL        2,000 mg 200 mL/hr over 120 Minutes Intravenous  Once 04/24/20 2204 04/25/20 0044   04/24/20 2200  cefTRIAXone (ROCEPHIN) 2 g in sodium chloride 0.9 % 100 mL IVPB        2 g 200 mL/hr over 30 Minutes Intravenous Every 24 hours 04/24/20 2153         Subjective: Seen and examined at bedside and states his arm was hurting today after Dr. Amedeo Plenty pushed on it.  Has not been elevating  it like he should.  Resigned to the fact that he needs to stay for further treatment and needs Ortho clearance prior to discharge.  We will continue IV antibiotics.  No nausea or vomiting.  States that he slept fairly well.  No other concerns or complaints at this time.  Objective: Vitals:   04/25/20 0932 04/25/20 1211 04/25/20 2255 04/26/20 0520  BP: (!) 183/137 (!) 173/119 (!) 163/96 (!) 142/97  Pulse: (!) 109 75 95 79  Resp:  20 20 20   Temp:   99 F (37.2 C) 98.9 F (37.2 C)  TempSrc:      SpO2:  97% 99% 97%  Weight:      Height:        Intake/Output Summary (Last 24 hours) at 04/26/2020 1142 Last data filed at 04/26/2020 0200 Gross per 24 hour  Intake 600 ml  Output 300 ml  Net 300 ml   Filed Weights   04/24/20 1753 04/24/20  2339  Weight: 127.5 kg 127.5 kg   Examination: Physical Exam:  Constitutional: WN/WD obese Caucasian male in NAD and appears calm and comfortable Eyes: Lids and conjunctivae normal, sclerae anicteric  ENMT: External Ears, Nose appear normal. Grossly normal hearing.  Neck: Appears normal, supple, no cervical masses, normal ROM, no appreciable thyromegaly; no JVD Respiratory: Diminished to auscultation bilaterally with coarse breath sounds, no wheezing, rales, rhonchi or crackles. Normal respiratory effort and patient is not tachypenic. No accessory muscle use.  Cardiovascular: Irregularly Irregular, no murmurs / rubs / gallops. S1 and S2 auscultated. No extremity edema. 2+ pedal pulses. No carotid bruits.  Abdomen: Soft, non-tender, Distended 2/2 to body habitus. No masses palpated. No appreciable hepatosplenomegaly. Bowel sounds positive.  GU: Deferred. Musculoskeletal: No clubbing / cyanosis of digits/nails.  Left arm is in a Ace bandage wrap from a surgical procedure and he does have some distal swelling in his hand.  Skin: No rashes, lesions, ulcers on limited skin evaluation. No induration; Warm and dry.  Neurologic: CN 2-12 grossly intact with no  focal deficits. Romberg sign and cerebellar reflexes not assessed.  Psychiatric: Normal judgment and insight. Alert and oriented x 3. Normal mood and appropriate affect.   Data Reviewed: I have personally reviewed following labs and imaging studies  CBC: Recent Labs  Lab 04/24/20 1908 04/25/20 0432 04/26/20 0409  WBC 13.3* 12.5* 10.6*  NEUTROABS 11.0*  --  7.7  HGB 13.3 13.4 13.5  HCT 43.8 43.5 43.4  MCV 88.3 87.9 87.3  PLT 336 334 579   Basic Metabolic Panel: Recent Labs  Lab 04/24/20 1908 04/25/20 0432 04/26/20 0409  NA 138 139 137  K 4.3 3.8 3.6  CL 106 106 106  CO2 23 20* 21*  GLUCOSE 86 94 106*  BUN 17 16 14   CREATININE 0.87 0.91 0.92  CALCIUM 8.6* 8.5* 8.4*  MG  --   --  2.2  PHOS  --   --  3.8   GFR: Estimated Creatinine Clearance: 121.4 mL/min (by C-G formula based on SCr of 0.92 mg/dL). Liver Function Tests: Recent Labs  Lab 04/24/20 1908 04/25/20 0432 04/26/20 0409  AST 28 22 19   ALT 68* 52* 45*  ALKPHOS 146* 124 130*  BILITOT 1.0 0.8 0.7  PROT 7.7 7.1 7.4  ALBUMIN 3.3* 3.0* 3.1*   No results for input(s): LIPASE, AMYLASE in the last 168 hours. No results for input(s): AMMONIA in the last 168 hours. Coagulation Profile: No results for input(s): INR, PROTIME in the last 168 hours. Cardiac Enzymes: No results for input(s): CKTOTAL, CKMB, CKMBINDEX, TROPONINI in the last 168 hours. BNP (last 3 results) No results for input(s): PROBNP in the last 8760 hours. HbA1C: No results for input(s): HGBA1C in the last 72 hours. CBG: Recent Labs  Lab 04/25/20 0931  GLUCAP 104*   Lipid Profile: No results for input(s): CHOL, HDL, LDLCALC, TRIG, CHOLHDL, LDLDIRECT in the last 72 hours. Thyroid Function Tests: No results for input(s): TSH, T4TOTAL, FREET4, T3FREE, THYROIDAB in the last 72 hours. Anemia Panel: No results for input(s): VITAMINB12, FOLATE, FERRITIN, TIBC, IRON, RETICCTPCT in the last 72 hours. Sepsis Labs: Recent Labs  Lab 04/24/20 1908   LATICACIDVEN 1.0    Recent Results (from the past 240 hour(s))  Blood culture (routine x 2)     Status: None (Preliminary result)   Collection Time: 04/24/20  7:08 PM   Specimen: BLOOD  Result Value Ref Range Status   Specimen Description   Final  BLOOD RIGHT ANTECUBITAL Performed at [facility] [address] [place], [place] [address]    Special Requests   Final    BOTTLES DRAWN AEROBIC AND ANAEROBIC Blood Culture adequate volume Performed at [facility] [address] [place], [place] [address]    Culture   Final    NO GROWTH < 12 HOURS Performed at [facility] [address], [place], [place] [address]    Report Status PENDING  Incomplete  Respiratory Panel by RT PCR (Flu A&B, Covid) - Nasopharyngeal Swab     Status: None   Collection Time: 04/24/20  8:41 PM   Specimen: Nasopharyngeal Swab  Result Value Ref Range Status   SARS Coronavirus 2 by RT PCR NEGATIVE NEGATIVE Final    Comment: (NOTE) SARS-CoV-2 target nucleic acids are NOT DETECTED.  The SARS-CoV-2 RNA is generally detectable in upper respiratoy specimens during the acute phase of infection. The lowest concentration of SARS-CoV-2 viral copies this assay can detect is 131 copies/mL. A negative result does not preclude SARS-Cov-2 infection and should not be used as the sole basis for treatment or other patient management decisions. A negative result may occur with  improper specimen collection/handling, submission of specimen other than nasopharyngeal swab, presence of viral mutation(s) within the areas targeted by this assay, and inadequate number of viral copies (<131 copies/mL). A negative result must be combined with clinical observations, patient history, and epidemiological information. The expected result is Negative.  Fact Sheet for Patients:  PinkCheek.be  Fact Sheet for Healthcare Providers:   GravelBags.it  This test is no t yet approved or cleared by the [place] FDA and  has been authorized for detection and/or diagnosis of SARS-CoV-2 by FDA under an Emergency Use Authorization (EUA). This EUA will remain  in effect (meaning this test can be used) for the duration of the COVID-19 declaration under Section 564(b)(1) of the Act, 21 U.S.C. section 360bbb-3(b)(1), unless the authorization is terminated or revoked sooner.     Influenza A by PCR NEGATIVE NEGATIVE Final   Influenza B by PCR NEGATIVE NEGATIVE Final    Comment: (NOTE) The Xpert Xpress SARS-CoV-2/FLU/RSV assay is intended as an aid in  the diagnosis of influenza from Nasopharyngeal swab specimens and  should not be used as a sole basis for treatment. Nasal washings and  aspirates are unacceptable for Xpert Xpress SARS-CoV-2/FLU/RSV  testing.  Fact Sheet for Patients: PinkCheek.be  Fact Sheet for Healthcare Providers: GravelBags.it  This test is not yet approved or cleared by the [place] FDA and  has been authorized for detection and/or diagnosis of SARS-CoV-2 by  FDA under an Emergency Use Authorization (EUA). This EUA will remain  in effect (meaning this test can be used) for the duration of the  Covid-19 declaration under Section 564(b)(1) of the Act, 21  U.S.C. section 360bbb-3(b)(1), unless the authorization is  terminated or revoked. Performed at [facility], [place] [address] [place] [address] [place], [place] [address]   Aerobic/Anaerobic Culture (surgical/deep wound)     Status: None (Preliminary result)   Collection Time: 04/24/20  9:43 PM   Specimen: BLOOD LEFT FOREARM; Abscess  Result Value Ref Range Status   Specimen Description   Final    BLOOD LEFT FOREARM Performed at [facility] [address] [place], [place] [address]    Special Requests   Final    NONE Performed  at [facility], [place] [address], [place], [place] [address]  Gram Stain   Final    MODERATE WBC PRESENT, PREDOMINANTLY PMN FEW GRAM POSITIVE COCCI IN PAIRS IN CLUSTERS    Culture   Final    FEW STAPHYLOCOCCUS AUREUS SUSCEPTIBILITIES TO FOLLOW Performed at [facility], [place] [address] [place], [place] [address]    Report Status PENDING  Incomplete  Blood culture (routine x 2)     Status: None (Preliminary result)   Collection Time: 04/24/20 10:46 PM   Specimen: BLOOD  Result Value Ref Range Status   Specimen Description   Final    BLOOD BLOOD RIGHT FOREARM Performed at [facility] [address], [place], [place] [address]    Special Requests   Final    BOTTLES DRAWN AEROBIC AND ANAEROBIC Blood Culture adequate volume Performed at [facility] [address], [place], [place] [address]    Culture   Final    NO GROWTH < 12 HOURS Performed at [facility] [address], [place],  [address]    Report Status PENDING  Incomplete     RN Pressure Injury Documentation:     Estimated body mass index is 34.21 kg/m as calculated from the following:   Height as of this encounter: 6' 4"  (1.93 m).   Weight as of this encounter: 127.5 kg.  Malnutrition Type:  Malnutrition Characteristics:  Nutrition Interventions:   Radiology Studies: DG Elbow Complete Left  Result Date: 04/24/2020 CLINICAL DATA:  Arm infection.  Increased swelling. EXAM: LEFT ELBOW - COMPLETE 3+ VIEW COMPARISON:  None. FINDINGS: There is a fracture through the left radial neck which appears subacute or chronic with nonunion. No joint effusion. No subluxation or dislocation. 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 No acute bony abnormality. Electronically Signed   By: Rolm Baptise M.D.   On: 04/24/2020 19:07   DG Forearm Left  Result Date: 04/24/2020 CLINICAL DATA:  Infection, injury 1  week ago.  Swelling. EXAM: LEFT FOREARM - 2 VIEW COMPARISON:  Elbow and forearm series today FINDINGS: Chronic appearing left radial neck fracture with nonunion. No acute fracture, subluxation or dislocation. Soft tissues are intact. No radiopaque foreign body. IMPRESSION: Chronic appearing left radial neck fracture with nonunion. No acute bony abnormality. Electronically Signed   By: Rolm Baptise M.D.   On: 04/24/2020 19:08   DG Wrist Complete Left  Result Date: 04/24/2020 CLINICAL DATA:  Infection, injury 1 week ago, swelling EXAM: LEFT WRIST - COMPLETE 3+ VIEW COMPARISON:  None. FINDINGS: There is no evidence of fracture or dislocation. There is no evidence of arthropathy or other focal bone abnormality. Soft tissues are unremarkable. IMPRESSION: Negative. Electronically Signed   By: Rolm Baptise M.D.   On: 04/24/2020 19:08   Scheduled Meds: . diltiazem  360 mg Oral q morning - 10a  . lisinopril  40 mg Oral Daily  . metoprolol succinate  50 mg Oral Daily  . montelukast  10 mg Oral QHS  . pantoprazole  40 mg Oral Daily  . pregabalin  100 mg Oral TID  . sodium chloride flush  3 mL Intravenous Q12H   Continuous Infusions: . cefTRIAXone (ROCEPHIN)  IV Stopped (04/25/20 2151)  . vancomycin 1,250 mg (04/26/20 0914)    LOS: 2 days   Kerney Elbe, DO [facility] PAGER is on [place]  If 7PM-7AM, please contact night-coverage [email or web address]

## 2020-04-26 NOTE — Plan of Care (Signed)
  Problem: Education: Goal: Knowledge of General Education information will improve Description: Including pain rating scale, medication(s)/side effects and non-pharmacologic comfort measures Outcome: Progressing   Problem: Health Behavior/Discharge Planning: Goal: Ability to manage health-related needs will improve Outcome: Progressing   Problem: Clinical Measurements: Goal: Ability to maintain clinical measurements within normal limits will improve Outcome: Progressing Goal: Will remain free from infection Outcome: Progressing Goal: Diagnostic test results will improve Outcome: Progressing Goal: Respiratory complications will improve Outcome: Progressing Goal: Cardiovascular complication will be avoided Outcome: Progressing   Problem: Activity: Goal: Risk for activity intolerance will decrease Outcome: Progressing   Problem: Nutrition: Goal: Adequate nutrition will be maintained Outcome: Progressing   Problem: Coping: Goal: Level of anxiety will decrease Outcome: Progressing   Problem: Elimination: Goal: Will not experience complications related to bowel motility Outcome: Progressing   Problem: Pain Managment: Goal: General experience of comfort will improve Outcome: Progressing   Problem: Safety: Goal: Ability to remain free from injury will improve Outcome: Progressing   Problem: Skin Integrity: Goal: Risk for impaired skin integrity will decrease Outcome: Progressing   Problem: Clinical Measurements: Goal: Postoperative complications will be avoided or minimized Outcome: Progressing   Problem: Skin Integrity: Goal: Demonstration of wound healing without infection will improve Outcome: Progressing

## 2020-04-26 NOTE — Progress Notes (Signed)
Patient ID: [name], male   DOB: Feb 24, 1956, 64 y.o.   MRN: [id number] Patient seen at bedside.  Patient has no signs of infection or dystrophy.  Patient I discussed all issues.  I removed his packing and decompressed again some hematoma formation.  The patient tolerated this well.  Following this with the wound.  We will have PT hydrotherapy see him today for a WaterPik and wet-to-dry dressing changes.  We would like to instruct this dressing change to him so that we can transition him to home with a wet-to-dry dressing change.  Given the bleeding and hematoma formation I have discontinued his Eliquis.  Hopefully this is not going to be a problem as he states he has been off of it in the past.  I feel his wound needs to stop oozing so as to allow coagulation parameters to restore themselves.  I feel that this is what got him in this predicament.  He had a injury with hematoma that became infected.  He is looking well today.  He is in good spirits.  We will await cultures.  I agree with Rocephin and vancomycin.  He needs to elevate his arm better and I instructed him on this and the nursing staff.  Elevation wound care and await cultures.  Hopefully we can keep him off his Eliquis for 2 to 3 days and allow the wound to stabilize.  Amedeo Plenty, MD [phone] phone

## 2020-04-27 DIAGNOSIS — L03119 Cellulitis of unspecified part of limb: Secondary | ICD-10-CM

## 2020-04-27 DIAGNOSIS — L02414 Cutaneous abscess of left upper limb: Secondary | ICD-10-CM | POA: Diagnosis not present

## 2020-04-27 DIAGNOSIS — I10 Essential (primary) hypertension: Secondary | ICD-10-CM | POA: Diagnosis not present

## 2020-04-27 DIAGNOSIS — F418 Other specified anxiety disorders: Secondary | ICD-10-CM | POA: Diagnosis not present

## 2020-04-27 LAB — CBC WITH DIFFERENTIAL/PLATELET
Abs Immature Granulocytes: 0.07 10*3/uL (ref 0.00–0.07)
Basophils Absolute: 0.1 10*3/uL (ref 0.0–0.1)
Basophils Relative: 1 %
Eosinophils Absolute: 0.4 10*3/uL (ref 0.0–0.5)
Eosinophils Relative: 5 %
HCT: 44.6 % (ref 39.0–52.0)
Hemoglobin: 13.7 g/dL (ref 13.0–17.0)
Immature Granulocytes: 1 %
Lymphocytes Relative: 26 %
Lymphs Abs: 2 10*3/uL (ref 0.7–4.0)
MCH: 27.2 pg (ref 26.0–34.0)
MCHC: 30.7 g/dL (ref 30.0–36.0)
MCV: 88.7 fL (ref 80.0–100.0)
Monocytes Absolute: 0.8 10*3/uL (ref 0.1–1.0)
Monocytes Relative: 10 %
Neutro Abs: 4.4 10*3/uL (ref 1.7–7.7)
Neutrophils Relative %: 57 %
Platelets: 358 10*3/uL (ref 150–400)
RBC: 5.03 MIL/uL (ref 4.22–5.81)
RDW: 16.2 % — ABNORMAL HIGH (ref 11.5–15.5)
WBC: 7.7 10*3/uL (ref 4.0–10.5)
nRBC: 0 % (ref 0.0–0.2)

## 2020-04-27 LAB — COMPREHENSIVE METABOLIC PANEL
ALT: 41 U/L (ref 0–44)
AST: 20 U/L (ref 15–41)
Albumin: 3 g/dL — ABNORMAL LOW (ref 3.5–5.0)
Alkaline Phosphatase: 115 U/L (ref 38–126)
Anion gap: 9 (ref 5–15)
BUN: 17 mg/dL (ref 8–23)
CO2: 24 mmol/L (ref 22–32)
Calcium: 8.6 mg/dL — ABNORMAL LOW (ref 8.9–10.3)
Chloride: 103 mmol/L (ref 98–111)
Creatinine, Ser: 0.97 mg/dL (ref 0.61–1.24)
GFR, Estimated: >60 mL/min (ref >60)
Glucose, Bld: 117 mg/dL — ABNORMAL HIGH (ref 70–99)
Potassium: 3.4 mmol/L — ABNORMAL LOW (ref 3.5–5.1)
Sodium: 136 mmol/L (ref 135–145)
Total Bilirubin: 0.7 mg/dL (ref 0.3–1.2)
Total Protein: 7.2 g/dL (ref 6.5–8.1)

## 2020-04-27 LAB — MAGNESIUM: Magnesium: 2 mg/dL (ref 1.7–2.4)

## 2020-04-27 LAB — PHOSPHORUS: Phosphorus: 4.2 mg/dL (ref 2.5–4.6)

## 2020-04-27 MED ORDER — POTASSIUM CHLORIDE CRYS ER 20 MEQ PO TBCR
40.0000 meq | EXTENDED_RELEASE_TABLET | Freq: Two times a day (BID) | ORAL | Status: DC
  Administered 2020-04-27: 40 meq via ORAL
  Filled 2020-04-27: qty 2

## 2020-04-27 MED ORDER — DOXYCYCLINE HYCLATE 100 MG PO TABS: 100.0000 mg | ORAL_TABLET | Freq: Two times a day (BID) | ORAL | Status: DC

## 2020-04-27 MED ORDER — APIXABAN 5 MG PO TABS
5.0000 mg | ORAL_TABLET | Freq: Two times a day (BID) | ORAL | 11 refills | Status: DC
Start: 2020-04-28 — End: 2020-08-07

## 2020-04-27 MED ORDER — DOXYCYCLINE HYCLATE 100 MG PO TABS: 100.0000 mg | ORAL_TABLET | Freq: Two times a day (BID) | ORAL | 0 refills | Status: AC

## 2020-04-27 MED ORDER — ONDANSETRON HCL 4 MG PO TABS: 4.0000 mg | ORAL_TABLET | Freq: Four times a day (QID) | ORAL | 0 refills | Status: DC | PRN
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
## 2020-04-27 NOTE — Progress Notes (Signed)
AVS reviewed with patient. All new medication and follow up appointments discussed with patient. Patient voiced understanding .  Wound care supplies given to patient.  IV access removed. All patient belongings at bedside in pt duffle bag.  Patient awaiting wife for transport home.

## 2020-04-27 NOTE — Progress Notes (Signed)
Patient ID: [name], male   DOB: 1956/05/01, 63 y.o.   MRN: [id number] Patient is doing very well no signs of complications.  We will plan to see him back Monday at 8 AM and discharge him on doxycycline for 14 days.  Continue daily dressing changes as outlined and instructed.  Should any problems occur I will be immediately available.  I discussed these issues with him at length and all questions have been encouraged and answered.  His deep abscess is improving.  I will ask him to begin his Eliquis Saturday   Final diagnosis deep abscess growing MRSA sensitive to doxycycline.  I will see him Monday.  Appreciate help of hospitalist.  Amedeo Plenty MD

## 2020-04-27 NOTE — Discharge Summary (Signed)
Physician Discharge Summary  [name]:[id number] DOB: 1955/09/21 DOA: 04/24/2020  PCP: [name], MD  Admit date: 04/24/2020 Discharge date: 04/27/2020  Admitted From: Home Disposition: Home   Recommendations for Outpatient Follow-up:  1. Follow up with PCP in 1-2 weeks 2. Follow up with Dr. Roseanne Kaufman on Monday 04/30/20 3. Neurology Dr. Stanford Breed in the outpatient setting in 1 to 2 weeks 4. Please obtain CMP/CBC, Mag, Phos in one week 5. Please follow up on the following pending results:  Home Health: No Equipment/Devices: None    Discharge Condition: Stable  CODE STATUS: FULL CODE Diet recommendation: Heart Healthy Diet  Brief/Interim Summary: HPI per Dr. Zada Finders on 04/24/20 [name] Shannonis a 64 y.o.malewith medical history significant forpersistent atrial fibrillation on Eliquis (failed prior ablation and multiple DC cardioversions), nonischemic dilated cardiomyopathy (felt tachycardia mediated, lastEF 55-60% by TTE 01/19/2020),asthma, hypertension, hyperlipidemia, restless leg syndrome, and depression/anxiety who presents to the ED for evaluation of wound to his left forearm/elbow.  Patient states about 1 week ago he was helping load on equipment when he aerated fell onto his left elbow/forearm. Initially he had mild pain however over the last couple days he noticed significantly increased swelling. Last night he noticed erythema and purulent discharge from the area causing him to have a mini panic attack. He was having significant pain and feelings of fevers, chills, diaphoresis at home and therefore went to urgent care for further evaluation earlier today. He was subsequently sent to the ED for further management.  ED Course: Initial vitals showed BP 181/108, pulse 100, RR 26, temp 98.8 Fahrenheit, SPO2 100% on room air.  Labs show WBC 13.3, hemoglobin 13.3, platelets 336,000, sodium 138, potassium 4.3, bicarb 23, BUN 17, creatinine  0.87, AST 28, ALT 68, alk phos 146, total bilirubin 1.0, lactic acid 1.0. Blood culture was obtained and pending.  Urinalysis shows negative nitrites, large leukocytes, 0-5 RBC/hpf, >50 WBC/hpf, many bacteria microscopy. SARS-CoV-2 PCR is collected and pending.  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  EDP consulted hand surgery, Dr. Amedeo Plenty who recommended medical admission and will plan for I&D in the OR tonight. The hospitalist service was consulted to admit for further evaluation and management.  **Interim History  Patient underwent irrigation debridement of the deep abscess in the left forearm along with osteotomy with exploration of the muscular tissue of the left forearm.  He is postoperative day 1 and currently remains on IV antibiotics.  Gram stain shown gram-positive cocci in pairs and clusters with sensitivities still pending.  We will continue empiric antibiotics until culture results and de-escalate as appropriately and transition to p.o.  04/26/2020: Cultures are growing out staph aureus however sensitivities are still pending.  Orthopedic surgery evaluated and patient had another hematoma and still has Eliquis has been held for now and I discussed the case with cardiology PA who recommends holding off for now not transition to heparin drip but resuming it once as able and cleared by Ortho.  Patient to undergo hydrotherapy today.  We will continue IV ceftriaxone and vancomycin for now and de-escalate once sensitivities are resulted and may discuss with ID about further antibiotic duration course.  04/27/2020: Ultra sensitivities came back and patient was sensitive to tetracyclines so he was changed to doxycycline at discharge for 14 days per orthopedic recommendations.  Orthopedic surgery cleared the patient for discharge and recommended wet-to-dry dressing changes with the patient is comfortable.  Patient is to follow-up with Dr. Amedeo Plenty on Monday and follow-up with his PCP within 1 to 2 weeks.  He is stable for discharge at this time and his arm is doing well with no signs of complications as his abscess is improved.  He is to go back on Eliquis in the morning and will need to follow-up with cardiology in outpatient setting.  Currently stable be discharged home at this time  Discharge Diagnoses:  Principal Problem:   Abscess of left forearm Active Problems:   Hyperlipidemia   Anxiety with depression   Essential hypertension   Persistent atrial fibrillation (HCC)  Abscess of Left Forearm/Elbow status post I&D postoperative day 3 -Appreciate hand surgery assistance. Dr. Amedeo Plenty at bedside performing I&D, cultures obtained and sent to lab.  -Initial Gram Stain showing Moderate WBC present, Predominatntly PMN and Few Gram Positive Cocci in pairs in Clusters; Gram stain showing rare Staph aureus with sensitivity still pending -Start Empiric IV Vancomycin and Ceftriaxone and will continue until Sensitivity Results -Follow wound cultures and narrow antibiotics as able; Blood Cx x2 showed no growth to date in 2 days -Pain control with analgesics as needed; On po Acetaminophen 650 mg po/RC q6hprn Mild Pain/Fever, Oxycodone 5 mg q4hprn Moderate Pain and IV Morphine 2 mg IV q4hprn Severe -Started IV fluid hydration overnight but this has now stopped -Hydrotherapy done yesterday -WBC is trending down and went from 13.3 -> 12.5 -> 10.6 and is now 7.7 -C/w Supportive Care and with Antiemetics with po/IV Zofran q6hpnr Nausea -Orthopedic surgery evaluated today and removed his back and decompressed some hematoma formation again and because the patient has some bleeding and hematoma formation Dr. Amedeo Plenty has discontinued his Eliquis for now and recommending holding it for least 2 to 3 days until the wound is able to stabilize; I discussed with the cardiology PA today and they recommended resuming as soon  as possible this patient was going to get an outpatient ablation or cardioversion eventually. -Orthopedic surgery has cleared the patient for discharge and he will need to go home with wet-to-dry dressing changes and follow-up with Dr. Amedeo Plenty on Monday.  Culture sensitivities are sensitive to doxycycline so be discharged on 14-day antibiotic course at the recommendation of orthopedic surgery  Persistent Atrial Fibrillation -S/p ablation and multiple failed cardioversions.  -CHA2DS2-VASc is 2, he has been on Eliquis for anticoagulation and diltiazem for rate control. -Follows with cardiology, Dr. Stanford Breed. Slight tachycardia related to wound infection and pain and HR ranging from 61-119 with last read of 109 -Eliquis held initially for I&D and was resumed yesterday but because the patient had some hematoma formation and bleeding this was discontinued and orthopedic surgery recommending holding it for least 2 to 3 days and allowing the wound to stabilize; Dr. Amedeo Plenty recommends resuming it on 04/28/2020 -Resume home Diltiazem 360 mg daily and Metoprolol Succinate 50 mg po Daily  -Had a cardiology appointment back in January and had a repeat attempted cardioversion on amiodarone which was unsuccessful; he is to be referred to CVTS for consider patient a surgical maze -Continue to Monitor on Telemetry while hospitalized  Hypokalemia -Patient's K+ this AM was 3.4 -Replete with po KCl 40 mEQ BID x2  -Continue to Monitor and Replete as Necessary -Repeat CMP within 1 week  Nonischemic Dilated Cardiomyopathy and felt to be tachycardia mediated Chronic diastolic CHF -Previously felt tachycardia mediated.  -Subsequent echocardiograms showed improvement with last EF 55-60% by TTE 01/19/2020.  -No evidence of volume overload at time of  admission. -C/w Lisinopril and Metoprolol Succinate as below; does not appear to be taking Lasix anymore -Follow-up with cardiology in outpatient  setting  Hypertension -Resumed home Diltiazem 360 mg po Daily and Lisinopril 40 mg po Daily -Takes Metoprolol Succinate 50 mg po DailyPRN but will schedule given his elevated HR and BP -Continue to Monitor BP per Protocol -Last BP was  143/97 -Added Labetalol 10 mg IV q2hprn for SBP<180 or DBP>100  Hyperlipidemia -Not currently on statin therapy.  Asthma -Stable without acute exacerbation.  -Continue Montelukast 10 mg po qHS and added Albuterol IH 1-2 puff q6h if needed for Wheezing and SOB.  Anxiety -Continue Alprazolam 0.5 mg nightly if needed.  Metabolic Acidosis -Mild. Patient's CO2 is now  24, anion gap is 9, chloride level is 103 -On Admission his CO2 was 23, AG was 9, and Chloride Level was 106 -Continue to Monitor and Trend and if Necessary will place on IVF -Follow-up in the outpatient setting  Elevated ALT -Improving and Likely Reactive -ALT went from 68 -> 52 and is further trending down to 45 and today is now 41 -Continue to Monitor and Trend and if Necessary will obtain a RUQ U/S and Acute Hepatitis Panel -Repeat CMP within 1 week  GERD -Continue with PPI with Pantoprazole 40 mg p.o. daily  Obesity -Complicates overall prognosis and Care -Estimated body mass index is 34.21 kg/m as calculated from the following: -Estimated body mass index is 34.21 kg/m as calculated from the following:   Height as of this encounter: 6' 4" (1.93 m).   Weight as of this encounter: 127.5 kg. -Weight Loss and Dietary Counseling given  Asymptomatic Bacteriuria/Pyuria -Urinalysis showed hazy appearance with yellow color urine, small hemoglobin, large leukocytes, negative nitrites, many bacteria, 0-5 RBCs per high-power field, greater than 50 WBCs -Patient does not have any burning or discomfort or any flank pain but states that he was having some urinary frequency -Empirically covered with his antibiotics as above and now changed him and discontinued his ceftriaxone and  vancomycin and is now on doxycycline for skin infection  Hyperglycemia -Patient's blood sugar was little elevated this morning and likely reactive on CMP as it was 106; random blood sugar is 104 -Check hemoglobin A1c as an outpatient  to rule out diabetes -Continue to monitor and trend blood sugars carefully if necessary and will add sensitive NovoLog sliding scale insulin while hospitalized  Discharge Instructions Discharge Instructions    Call MD for:  difficulty breathing, headache or visual disturbances   Complete by: As directed    Call MD for:  extreme fatigue   Complete by: As directed    Call MD for:  hives   Complete by: As directed    Call MD for:  persistant dizziness or light-headedness   Complete by: As directed    Call MD for:  persistant nausea and vomiting   Complete by: As directed    Call MD for:  redness, tenderness, or signs of infection (pain, swelling, redness, odor or green/yellow discharge around incision site)   Complete by: As directed    Call MD for:  severe uncontrolled pain   Complete by: As directed    Call MD for:  temperature >100.4   Complete by: As directed    Diet - low sodium heart healthy   Complete by: As directed    Discharge instructions   Complete by: As directed    You were cared for by a hospitalist during your hospital stay. If you  have any questions about your discharge medications or the care you received while you were in the hospital after you are discharged, you can call the unit and ask to speak with the hospitalist on call if the hospitalist that took care of you is not available. Once you are discharged, your primary care physician will handle any further medical issues. Please note that NO REFILLS for any discharge medications will be authorized once you are discharged, as it is imperative that you return to your primary care physician (or establish a relationship with a primary care physician if you do not have one) for your  aftercare needs so that they can reassess your need for medications and monitor your lab values.  Follow up with PCP within 1 week and Orthopedic Surgery Dr. Amedeo Plenty Monday at 8.:00 AM Take all medications as prescribed. If symptoms change or worsen please return to the ED for evaluation   Discharge wound care:   Complete by: As directed    Per Ortho Wet To Dry Dressing changes delineated by Dr. Amedeo Plenty   Increase activity slowly   Complete by: As directed      Allergies as of 04/27/2020      Reactions   Hydrocodone Other (See Comments)   GI upset, dizziness      Medication List    STOP taking these medications   ibuprofen 200 MG tablet Commonly known as: ADVIL     TAKE these medications   ALPRAZolam 0.5 MG tablet Commonly known as: XANAX TAKE 1 TABLET(0.5 MG) BY MOUTH AT BEDTIME AS NEEDED FOR ANXIETY OR SLEEP What changed: See the new instructions.   apixaban 5 MG Tabs tablet Commonly known as: Eliquis Take 1 tablet (5 mg total) by mouth 2 (two) times daily. Start taking on: April 28, 2020   diltiazem 360 MG 24 hr capsule Commonly known as: CARDIZEM CD TAKE 1 CAPSULE BY MOUTH EVERY MORNING What changed: when to take this   doxycycline 100 MG tablet Commonly known as: VIBRA-TABS Take 1 tablet (100 mg total) by mouth every 12 (twelve) hours for 14 days.   lisinopril 40 MG tablet Commonly known as: ZESTRIL Take 1 tablet (40 mg total) by mouth daily.   metoprolol succinate 25 MG 24 hr tablet Commonly known as: Toprol XL Take 2 tablets (50 mg total) by mouth daily. What changed:   when to take this  reasons to take this   montelukast 10 MG tablet Commonly known as: SINGULAIR Take 10 mg by mouth daily as needed (sob).   omeprazole 20 MG capsule Commonly known as: PRILOSEC Take 20 mg by mouth daily as needed.   ondansetron 4 MG tablet Commonly known as: ZOFRAN Take 1 tablet (4 mg total) by mouth every 6 (six) hours as needed for nausea.   pregabalin 100  MG capsule Commonly known as: Lyrica Take 1 capsule (100 mg total) by mouth 3 (three) times daily.   rOPINIRole 1 MG tablet Commonly known as: REQUIP Take 1-2 mg by mouth at bedtime as needed (restless leg syndrome). 1 tablet after lunch and 2 tablet at bedtime   sildenafil 20 MG tablet Commonly known as: REVATIO Take 40-100 mg by mouth daily as needed (ED).   traMADol 50 MG tablet Commonly known as: ULTRAM Take 50 mg by mouth at bedtime as needed for pain.            Discharge Care Instructions  (From admission, onward)         Start  Ordered   04/27/20 0000  Discharge wound care:       Comments: Per Ortho Wet To Dry Dressing changes delineated by Dr. Amedeo Plenty   04/27/20 1119          Allergies  Allergen Reactions  . Hydrocodone Other (See Comments)    GI upset, dizziness   Consultations:  Orthopedic Surgery  Procedures/Studies: DG Elbow Complete Left  Result Date: 04/24/2020 CLINICAL DATA:  Arm infection.  Increased swelling. EXAM: LEFT ELBOW - COMPLETE 3+ VIEW COMPARISON:  None. FINDINGS: There is a fracture through the left radial neck which appears subacute or chronic with nonunion. No joint effusion. No subluxation or dislocation. 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 No acute bony abnormality. Electronically Signed   By: Rolm Baptise M.D.   On: 04/24/2020 19:07   DG Forearm Left  Result Date: 04/24/2020 CLINICAL DATA:  Infection, injury 1 week ago.  Swelling. EXAM: LEFT FOREARM - 2 VIEW COMPARISON:  Elbow and forearm series today FINDINGS: Chronic appearing left radial neck fracture with nonunion. No acute fracture, subluxation or dislocation. Soft tissues are intact. No radiopaque foreign body. IMPRESSION: Chronic appearing left radial neck fracture with nonunion. No acute bony abnormality. Electronically Signed   By: Rolm Baptise M.D.   On: 04/24/2020 19:08   DG Wrist Complete Left  Result Date:  04/24/2020 CLINICAL DATA:  Infection, injury 1 week ago, swelling EXAM: LEFT WRIST - COMPLETE 3+ VIEW COMPARISON:  None. FINDINGS: There is no evidence of fracture or dislocation. There is no evidence of arthropathy or other focal bone abnormality. Soft tissues are unremarkable. IMPRESSION: Negative. Electronically Signed   By: Rolm Baptise M.D.   On: 04/24/2020 19:08    Subjective: Seen and examined at bedside and was feeling well.  Denies chest pain, lightheadedness or dizziness.  No nausea or vomiting.  Ready to go home and states his arm swelling is much improved.  No other concerns or complaints at this time and he has been cleared by Ortho to go home and follow-up with Dr. Amedeo Plenty on Monday at 8 AM.   Discharge Exam: Vitals:   04/27/20 0506 04/27/20 0923  BP: 126/77 (!) 143/97  Pulse: 63 77  Resp: 16   Temp:    SpO2: 98%    Vitals:   04/26/20 2155 04/26/20 2219 04/27/20 0506 04/27/20 0923  BP: (!) 156/105 (!) 160/99 126/77 (!) 143/97  Pulse: 81 66 63 77  Resp: 18  16   Temp: (!) 97.5 F (36.4 C)     TempSrc: Oral     SpO2: 99% 100% 98%   Weight:      Height:       General: Pt is alert, awake, not in acute distress Cardiovascular: Irregularly irregular, S1/S2 +, no rubs, no gallops Respiratory: Diminished bilaterally, no wheezing, no rhonchi; unlabored breathing Abdominal: Soft, NT, distended secondary body habitus, bowel sounds + Extremities: Mild edema, no cyanosis  The results of significant diagnostics from this hospitalization (including imaging, microbiology, ancillary and laboratory) are listed below for reference.    Microbiology: Recent Results (from the past 240 hour(s))  Blood culture (routine x 2)     Status: None (Preliminary result)   Collection Time: 04/24/20  7:08 PM   Specimen: BLOOD  Result Value Ref Range Status   Specimen Description   Final    BLOOD RIGHT ANTECUBITAL Performed at [facility] [address] [place], [place]  [address]  Special Requests   Final    BOTTLES DRAWN AEROBIC AND ANAEROBIC Blood Culture adequate volume Performed at [facility] [address] [place], [place] [address]    Culture   Final    NO GROWTH 3 DAYS Performed at [facility], [place] [address], [place], [place] [address]    Report Status PENDING  Incomplete  Respiratory Panel by RT PCR (Flu A&B, Covid) - Nasopharyngeal Swab     Status: None   Collection Time: 04/24/20  8:41 PM   Specimen: Nasopharyngeal Swab  Result Value Ref Range Status   SARS Coronavirus 2 by RT PCR NEGATIVE NEGATIVE Final    Comment: (NOTE) SARS-CoV-2 target nucleic acids are NOT DETECTED.  The SARS-CoV-2 RNA is generally detectable in upper respiratoy specimens during the acute phase of infection. The lowest concentration of SARS-CoV-2 viral copies this assay can detect is 131 copies/mL. A negative result does not preclude SARS-Cov-2 infection and should not be used as the sole basis for treatment or other patient management decisions. A negative result may occur with  improper specimen collection/handling, submission of specimen other than nasopharyngeal swab, presence of viral mutation(s) within the areas targeted by this assay, and inadequate number of viral copies (<131 copies/mL). A negative result must be combined with clinical observations, patient history, and epidemiological information. The expected result is Negative.  Fact Sheet for Patients:  PinkCheek.be  Fact Sheet for Healthcare Providers:  GravelBags.it  This test is no t yet approved or cleared by the [place] FDA and  has been authorized for detection and/or diagnosis of SARS-CoV-2 by FDA under an Emergency Use Authorization (EUA). This EUA will remain  in effect (meaning this test can be used) for the duration of the COVID-19 declaration under Section 564(b)(1) of the Act, 21  U.S.C. section 360bbb-3(b)(1), unless the authorization is terminated or revoked sooner.     Influenza A by PCR NEGATIVE NEGATIVE Final   Influenza B by PCR NEGATIVE NEGATIVE Final    Comment: (NOTE) The Xpert Xpress SARS-CoV-2/FLU/RSV assay is intended as an aid in  the diagnosis of influenza from Nasopharyngeal swab specimens and  should not be used as a sole basis for treatment. Nasal washings and  aspirates are unacceptable for Xpert Xpress SARS-CoV-2/FLU/RSV  testing.  Fact Sheet for Patients: PinkCheek.be  Fact Sheet for Healthcare Providers: GravelBags.it  This test is not yet approved or cleared by the [place] FDA and  has been authorized for detection and/or diagnosis of SARS-CoV-2 by  FDA under an Emergency Use Authorization (EUA). This EUA will remain  in effect (meaning this test can be used) for the duration of the  Covid-19 declaration under Section 564(b)(1) of the Act, 21  U.S.C. section 360bbb-3(b)(1), unless the authorization is  terminated or revoked. Performed at [facility], [place] [address], [place], [place] [address]   Aerobic/Anaerobic Culture (surgical/deep wound)     Status: None (Preliminary result)   Collection Time: 04/24/20  9:43 PM   Specimen: BLOOD LEFT FOREARM; Abscess  Result Value Ref Range Status   Specimen Description   Final    BLOOD LEFT FOREARM Performed at [facility] [address] [place], [place] [address]    Special Requests   Final    NONE Performed at [facility], [place] [address], [place], [place] [address]    Gram Stain   Final    MODERATE WBC PRESENT, PREDOMINANTLY PMN FEW GRAM POSITIVE COCCI IN  PAIRS IN CLUSTERS Performed at [facility], [place] [address], [place], [place] [address]    Culture   Final    FEW STAPHYLOCOCCUS AUREUS NO ANAEROBES ISOLATED; CULTURE IN PROGRESS FOR 5 DAYS    Report  Status PENDING  Incomplete   Organism ID, Bacteria STAPHYLOCOCCUS AUREUS  Final      Susceptibility   Staphylococcus aureus - MIC*    CIPROFLOXACIN <=0.5 SENSITIVE Sensitive     ERYTHROMYCIN RESISTANT Resistant     GENTAMICIN <=0.5 SENSITIVE Sensitive     OXACILLIN <=0.25 SENSITIVE Sensitive     TETRACYCLINE <=1 SENSITIVE Sensitive     VANCOMYCIN <=0.5 SENSITIVE Sensitive     TRIMETH/SULFA <=10 SENSITIVE Sensitive     CLINDAMYCIN RESISTANT Resistant     RIFAMPIN <=0.5 SENSITIVE Sensitive     Inducible Clindamycin POSITIVE Resistant     * FEW STAPHYLOCOCCUS AUREUS  Blood culture (routine x 2)     Status: None (Preliminary result)   Collection Time: 04/24/20 10:46 PM   Specimen: BLOOD  Result Value Ref Range Status   Specimen Description   Final    BLOOD BLOOD RIGHT FOREARM Performed at [facility] [address] [place], [place] [address]    Special Requests   Final    BOTTLES DRAWN AEROBIC AND ANAEROBIC Blood Culture adequate volume Performed at [facility] [address] [place], [place] [address]    Culture   Final    NO GROWTH 2 DAYS Performed at [facility] [address] [place],  [address]    Report Status PENDING  Incomplete    Labs: BNP (last 3 results) No results for input(s): BNP in the last 8760 hours. Basic Metabolic Panel: Recent Labs  Lab 04/24/20 1908 04/25/20 0432 04/26/20 0409 04/27/20 0401  NA 138 139 137 136  K 4.3 3.8 3.6 3.4*  CL 106 106 106 103  CO2 23 20* 21* 24  GLUCOSE 86 94 106* 117*  BUN _0 CREATININE 0.87 0.91 0.92 0.97  CALCIUM 8.6* 8.5* 8.4* 8.6*  MG  --   --  2.2 2.0  PHOS  --   --  3.8 4.2   Liver Function Tests: Recent Labs  Lab 04/24/20 1908 04/25/20 0432 04/26/20 0409 04/27/20 0401  AST _1 ALT 68* 52* 45* 41  ALKPHOS 146* 124 130* 115  BILITOT 1.0 0.8 0.7 0.7  PROT 7.7 7.1 7.4 7.2  ALBUMIN 3.3* 3.0* 3.1* 3.0*   No results for input(s):  LIPASE, AMYLASE in the last 168 hours. No results for input(s): AMMONIA in the last 168 hours. CBC: Recent Labs  Lab 04/24/20 1908 04/25/20 0432 04/26/20 0409 04/27/20 0401  WBC 13.3* 12.5* 10.6* 7.7  NEUTROABS 11.0*  --  7.7 4.4  HGB 13.3 13.4 13.5 13.7  HCT 43.8 43.5 43.4 44.6  MCV 88.3 87.9 87.3 88.7  PLT 336 334 376 358   Cardiac Enzymes: No results for input(s): CKTOTAL, CKMB, CKMBINDEX, TROPONINI in the last 168 hours. BNP: Invalid input(s): POCBNP CBG: Recent Labs  Lab 04/25/20 0931  GLUCAP 104*   D-Dimer No results for input(s): DDIMER in the last 72 hours. Hgb A1c No results for input(s): HGBA1C in the last 72 hours. Lipid Profile No results for input(s): CHOL, HDL, LDLCALC, TRIG, CHOLHDL, LDLDIRECT in the last 72 hours. Thyroid function studies No results for input(s): TSH, T4TOTAL, T3FREE, THYROIDAB in the last 72 hours.  Invalid input(s):  FREET3 Anemia work up No results for input(s): VITAMINB12, FOLATE, FERRITIN, TIBC, IRON, RETICCTPCT in the last 72 hours. Urinalysis    Component Value Date/Time   COLORURINE YELLOW 04/24/2020 2035   APPEARANCEUR HAZY (A) 04/24/2020 2035   LABSPEC 1.013 04/24/2020 2035   PHURINE 7.0 04/24/2020 2035   GLUCOSEU NEGATIVE 04/24/2020 2035   HGBUR SMALL (A) 04/24/2020 2035   BILIRUBINUR NEGATIVE 04/24/2020 2035   KETONESUR NEGATIVE 04/24/2020 2035   PROTEINUR NEGATIVE 04/24/2020 2035   UROBILINOGEN 0.2 01/13/2013 1246   NITRITE NEGATIVE 04/24/2020 2035   LEUKOCYTESUR LARGE (A) 04/24/2020 2035   Sepsis Labs Invalid input(s): PROCALCITONIN,  WBC,  LACTICIDVEN Microbiology Recent Results (from the past 240 hour(s))  Blood culture (routine x 2)     Status: None (Preliminary result)   Collection Time: 04/24/20  7:08 PM   Specimen: BLOOD  Result Value Ref Range Status   Specimen Description   Final    BLOOD RIGHT ANTECUBITAL Performed at [facility], [place] [address], [place], [place] [address]     Special Requests   Final    BOTTLES DRAWN AEROBIC AND ANAEROBIC Blood Culture adequate volume Performed at [facility] [address], [place], [place] [address]    Culture   Final    NO GROWTH 3 DAYS Performed at [facility], [place] [address], [place], [place] [address]    Report Status PENDING  Incomplete  Respiratory Panel by RT PCR (Flu A&B, Covid) - Nasopharyngeal Swab     Status: None   Collection Time: 04/24/20  8:41 PM   Specimen: Nasopharyngeal Swab  Result Value Ref Range Status   SARS Coronavirus 2 by RT PCR NEGATIVE NEGATIVE Final    Comment: (NOTE) SARS-CoV-2 target nucleic acids are NOT DETECTED.  The SARS-CoV-2 RNA is generally detectable in upper respiratoy specimens during the acute phase of infection. The lowest concentration of SARS-CoV-2 viral copies this assay can detect is 131 copies/mL. A negative result does not preclude SARS-Cov-2 infection and should not be used as the sole basis for treatment or other patient management decisions. A negative result may occur with  improper specimen collection/handling, submission of specimen other than nasopharyngeal swab, presence of viral mutation(s) within the areas targeted by this assay, and inadequate number of viral copies (<131 copies/mL). A negative result must be combined with clinical observations, patient history, and epidemiological information. The expected result is Negative.  Fact Sheet for Patients:  PinkCheek.be  Fact Sheet for Healthcare Providers:  GravelBags.it  This test is no t yet approved or cleared by the [place] FDA and  has been authorized for detection and/or diagnosis of SARS-CoV-2 by FDA under an Emergency Use Authorization (EUA). This EUA will remain  in effect (meaning this test can be used) for the duration of the COVID-19 declaration under Section 564(b)(1) of the Act, 21 U.S.C. section  360bbb-3(b)(1), unless the authorization is terminated or revoked sooner.     Influenza A by PCR NEGATIVE NEGATIVE Final   Influenza B by PCR NEGATIVE NEGATIVE Final    Comment: (NOTE) The Xpert Xpress SARS-CoV-2/FLU/RSV assay is intended as an aid in  the diagnosis of influenza from Nasopharyngeal swab specimens and  should not be used as a sole basis for treatment. Nasal washings and  aspirates are unacceptable for Xpert Xpress SARS-CoV-2/FLU/RSV  testing.  Fact Sheet for Patients: PinkCheek.be  Fact Sheet for Healthcare Providers: GravelBags.it  This test is not yet approved or cleared by the [place]  States FDA and  has been authorized for detection and/or diagnosis of SARS-CoV-2 by  FDA under an Emergency Use Authorization (EUA). This EUA will remain  in effect (meaning this test can be used) for the duration of the  Covid-19 declaration under Section 564(b)(1) of the Act, 21  U.S.C. section 360bbb-3(b)(1), unless the authorization is  terminated or revoked. Performed at [facility], [place] [address] [place], [place] [address]   Aerobic/Anaerobic Culture (surgical/deep wound)     Status: None (Preliminary result)   Collection Time: 04/24/20  9:43 PM   Specimen: BLOOD LEFT FOREARM; Abscess  Result Value Ref Range Status   Specimen Description   Final    BLOOD LEFT FOREARM Performed at [facility] [address] [place], [place] [address]    Special Requests   Final    NONE Performed at [facility], [place] [address], [place], [place] [address]    Gram Stain   Final    MODERATE WBC PRESENT, PREDOMINANTLY PMN FEW GRAM POSITIVE COCCI IN PAIRS IN CLUSTERS Performed at [facility], [place] [address] [place], [place] [address]    Culture   Final    FEW STAPHYLOCOCCUS AUREUS NO ANAEROBES ISOLATED; CULTURE IN PROGRESS FOR 5 DAYS    Report Status PENDING   Incomplete   Organism ID, Bacteria STAPHYLOCOCCUS AUREUS  Final      Susceptibility   Staphylococcus aureus - MIC*    CIPROFLOXACIN <=0.5 SENSITIVE Sensitive     ERYTHROMYCIN RESISTANT Resistant     GENTAMICIN <=0.5 SENSITIVE Sensitive     OXACILLIN <=0.25 SENSITIVE Sensitive     TETRACYCLINE <=1 SENSITIVE Sensitive     VANCOMYCIN <=0.5 SENSITIVE Sensitive     TRIMETH/SULFA <=10 SENSITIVE Sensitive     CLINDAMYCIN RESISTANT Resistant     RIFAMPIN <=0.5 SENSITIVE Sensitive     Inducible Clindamycin POSITIVE Resistant     * FEW STAPHYLOCOCCUS AUREUS  Blood culture (routine x 2)     Status: None (Preliminary result)   Collection Time: 04/24/20 10:46 PM   Specimen: BLOOD  Result Value Ref Range Status   Specimen Description   Final    BLOOD BLOOD RIGHT FOREARM Performed at [facility] [address], [place], [place] [address]    Special Requests   Final    BOTTLES DRAWN AEROBIC AND ANAEROBIC Blood Culture adequate volume Performed at [facility] [address] [place], [place] [address]    Culture   Final    NO GROWTH 2 DAYS Performed at [facility] [address] [place], [place] [address]    Report Status PENDING  Incomplete   Time coordinating discharge: 35 minutes  SIGNED:  Kerney Elbe, DO [facility] 04/27/2020, 5:40 PM Pager is on [place]  If 7PM-7AM, please contact night-coverage [email or web address]

## 2020-04-28 LAB — HEMOGLOBIN A1C
Hgb A1c MFr Bld: 5.7 % — ABNORMAL HIGH (ref 4.8–5.6)
Mean Plasma Glucose: 117 mg/dL

## 2020-04-29 LAB — CULTURE, BLOOD (ROUTINE X 2)
Culture: NO GROWTH
Special Requests: ADEQUATE

## 2020-04-30 LAB — CULTURE, BLOOD (ROUTINE X 2)
Culture: NO GROWTH
Special Requests: ADEQUATE

## 2020-04-30 LAB — AEROBIC/ANAEROBIC CULTURE W GRAM STAIN (SURGICAL/DEEP WOUND)

## 2020-07-26 ENCOUNTER — Other Ambulatory Visit: Payer: Self-pay | Admitting: Neurology

## 2020-07-27 ENCOUNTER — Telehealth: Payer: Self-pay | Admitting: Neurology

## 2020-07-27 NOTE — Telephone Encounter (Signed)
Attempted to call patient. Call went straight to voicemail. I left a message requesting a call back on Monday.

## 2020-07-27 NOTE — Telephone Encounter (Signed)
Pt. asks that RN gives him a call back on Mon. No information was given.

## 2020-07-27 NOTE — Telephone Encounter (Signed)
Attempted to reach patient again. Left second message to return my call on Monday.

## 2020-07-30 NOTE — Telephone Encounter (Signed)
I called the patient back. He was asking for alprazolam to be refilled for his anxiety disorder. It appears this was provided to him in the past but he was also referred to see a psychiatrist. States he is now under the care of a psychiatrist and was instructed to speak to him about the management of this medication. He is being seen by Dr. Clance Boll [facility] practice.   He wanted to reschedule his appts for RLS follow up and sleep consult. He ask that we make the times for him and he would get the information from his mychart account. He will call back, if the times do not work.

## 2020-07-30 NOTE — Telephone Encounter (Signed)
Pt returned call. Please call back when available. 

## 2020-07-31 NOTE — Telephone Encounter (Signed)
The patient called back and is unable to be seen by Dr. Enis Gash for a couple of weeks. He will need an appt prior to him taking over the alprazolam prescription. It is unclear why this has not been discussed prior this time. The patient was last seen here in July and instructed to follow up in three months and did not return in October, as planned. He no showed his sleep consult in September. Per vo by Dr. Krista Blue, she is not able to refill this controlled substance. She would like for him to contact his PCP. I returned the call to the patient and provided him with this information.

## 2020-08-07 ENCOUNTER — Other Ambulatory Visit: Payer: Self-pay | Admitting: Cardiology

## 2020-08-09 ENCOUNTER — Other Ambulatory Visit: Payer: Self-pay | Admitting: Cardiology

## 2020-08-16 ENCOUNTER — Encounter: Payer: Self-pay | Admitting: *Deleted

## 2020-09-05 ENCOUNTER — Telehealth: Payer: Self-pay | Admitting: Neurology

## 2020-09-05 ENCOUNTER — Institutional Professional Consult (permissible substitution): Payer: 59 | Admitting: Neurology

## 2020-09-05 NOTE — Telephone Encounter (Signed)
Patient was a no-show to his sleep consult visit scheduled for today with Dr. Brett Fairy.  Patient's wife and patient showed up to 2:45 pm.  They are demanding the patient be seen today.  Since he is in extreme discomfort.  I have offered the patient another appointment, earliest was March 16 at 8:30 AM.  He stated, " I have to be in pain until then."  At that point I informed this visit is for a sleep consult.  They were confused as to why he was going to have a sleep consult completed.  Informed that because of the restless leg, Dr Krista Blue had suggested this due to the restless leg and wanting to rule out if there was any organic sleep concerns that could be making this worse.  Both the patient and wife do not feel a sleep consult is necessary and have asked that a appointment be made to address the neurology side.  Looked up and saw the patient is scheduled for an appointment on Monday 09/10/20 with Sarah at 3:15 p.m.  Advised most likely that is where they received the information of checking out at 2:45 PM was in reference to that appointment.  They were appreciative for our help and will be back on Monday.

## 2020-09-06 ENCOUNTER — Encounter: Payer: Self-pay | Admitting: Neurology

## 2020-09-06 NOTE — Progress Notes (Deleted)
History: [name]  is a 65 year old male, accompanied by his wife, seen in request by primary care physician Dr. [name] for evaluation of left jerking movement, patient also complains of significant anxiety, panic attack, initial visit was in Sept 2020  I have reviewed and summarized the referring note from the referring physician.  He had a past medical history of hypertension, atrial fibrillation, on Xarelto treatment, also hyperlipidemia,  He began to complains of significant anxiety since the beginning of 2020, frequent panic attacks, feels very uneasy, quizzy sensation, difficulty breathing, frightened sensation, difficulty sleeping, lasting 2 to 3 hours, Xanax as needed was helpful, he was having anxiety, panic attack 3-4 times each week.  About July 2020, he began to have different episode, he had uncontrollable left leg large amplitude jerking movement, lasting for few seconds, followed by another jerking movement, whole episode can last 30 minutes, there is usually no panic, anxiety symptoms associated with his uncontrollable left jumpy movement, he was started on Requip 1 mg 2 tablets today, which has been very helpful  He denies significant bilateral upper lower extremity paresthesia, denies gait abnormality, there is no typical restless leg symptoms of urge to move his legs while holding still,.  He has been taking Cymbalta 60 mg twice a day for his anxiety with some help, he is hoping to see a psychiatrist, he is most worried about his worsening anxiety and panic attack  Update January 16, 2020: He was seen by nurse practitioner Jinny Blossom in December 2020, reported some improvement of his symptoms, since she was started on Requip higher dose, he is now taking 1 mg during the day, 2 mg at nighttime  He carries a diagnosis of restless leg symptoms, he described it at deep achy pain, discomfort, initially at nighttime, now begin to notice symptoms evening, early  afternoon, he reported intermittent leg jerking movement, to the point of moving his foot from pedal while driving, last few seconds, no loss of consciousness,  I reviewed his cardiologist Dr. Kirk Ruths evaluation January 2021, persistent atrial fibrillation, recent attempt of cardioversion, amiodarone was unsuccessful, he is symptomatic, planning on referral to CVTS for consideration of surgical maze, continue Coumadin, history of mitral valve regurgitation, 3+ at previous TEE, history of cardiomyopathy, previously felt to be tachycardia mediated, left ventricular function is normal, hypertension, but blood pressure is well controlled, chronic diastolic congestive heart failure, exaggerated by atrial arrhythmia,  obstructive sleep apnea,  Laboratory evaluations in April 2021, normal CMP, creatinine of 0.98, CBC, hemoglobin of 13.2, PSA 2.48 LDL of 70  He self realize he is dealing with significant anxiety," not handle stress very well", he is seeing psychotherapy, which has helped, he also complains of frequent wakening at nighttime, fatigue sleepiness during the day, he does have obesity  Update September 10, 2020 SS: In July 2021, ferritin level was decreased at 25 (normal is > 30), ideally RLS patients > 50.  Started over-the-counter ferrous sulfate.  Sent for sleep study consult, missed his appointment in September. In July, decreased Requip to 2 mg QHS.  Switch from gabapentin to Lyrica.  REVIEW OF SYSTEMS: Out of a complete 14 system review of symptoms, the patient complains only of the following symptoms, and all other reviewed systems are negative.  See HPI  ALLERGIES: Allergies  Allergen Reactions  . Hydrocodone Other (See Comments)    GI upset, dizziness    HOME MEDICATIONS: Outpatient Medications Prior to Visit  Medication Sig Dispense Refill  .  ALPRAZolam (XANAX) 0.5 MG tablet TAKE 1 TABLET(0.5 MG) BY MOUTH AT BEDTIME AS NEEDED FOR ANXIETY OR SLEEP (Patient taking differently:  Take 0.5 mg by mouth at bedtime as needed for anxiety or sleep. ) 30 tablet 5  . diltiazem (CARDIZEM CD) 360 MG 24 hr capsule Take 1 capsule (360 mg total) by mouth daily. 90 capsule 0  . ELIQUIS 5 MG TABS tablet TAKE 1 TABLET(5 MG) BY MOUTH TWICE DAILY 60 tablet 11  . lisinopril (ZESTRIL) 40 MG tablet Take 1 tablet (40 mg total) by mouth daily. 30 tablet 6  . metoprolol succinate (TOPROL XL) 25 MG 24 hr tablet Take 2 tablets (50 mg total) by mouth daily. (Patient taking differently: Take 50 mg by mouth daily as needed. ) 180 tablet 3  . montelukast (SINGULAIR) 10 MG tablet Take 10 mg by mouth daily as needed (sob).     Marland Kitchen omeprazole (PRILOSEC) 20 MG capsule Take 20 mg by mouth daily as needed.    . ondansetron (ZOFRAN) 4 MG tablet Take 1 tablet (4 mg total) by mouth every 6 (six) hours as needed for nausea. 20 tablet 0  . pregabalin (LYRICA) 100 MG capsule Take 1 capsule (100 mg total) by mouth 3 (three) times daily. (Patient not taking: Reported on 04/25/2020) 90 capsule 5  . rOPINIRole (REQUIP) 1 MG tablet Take 1-2 mg by mouth at bedtime as needed (restless leg syndrome). 1 tablet after lunch and 2 tablet at bedtime    . sildenafil (REVATIO) 20 MG tablet Take 40-100 mg by mouth daily as needed (ED).     . traMADol (ULTRAM) 50 MG tablet Take 50 mg by mouth at bedtime as needed for pain.      No facility-administered medications prior to visit.    PAST MEDICAL HISTORY: Past Medical History:  Diagnosis Date  . Allergic rhinitis   . Anxiety   . Asthma    as a child  . Chronic low back pain   . Complication of anesthesia    "hard to put asleep, hard to wake up, nausea and vomiting"  . Depression   . ED (erectile dysfunction)   . GERD (gastroesophageal reflux disease)   . HLD (hyperlipidemia)   . HTN (hypertension)    sees Dr. [name], [facility]  . Hypertrophy of prostate with urinary obstruction and other lower urinary tract symptoms (LUTS)   . IBS (irritable bowel  syndrome)   . Inguinal hernia without mention of obstruction or gangrene, unilateral or unspecified, (not specified as recurrent)   . Insomnia   . Lumbar herniated disc    L7  . Morbid obesity ([place])   . Narcotic addiction ([place])   . NICM (nonischemic cardiomyopathy) (HCC)    tachycardia mediated,  resolved with sinus  . Persistent atrial fibrillation ([place])    ablation done 03/2006 at [facility]  . Pneumonia    hx of 2004  . PONV (postoperative nausea and vomiting)   . Twitching    legs    PAST SURGICAL HISTORY: Past Surgical History:  Procedure Laterality Date  . ATRIAL ABLATION SURGERY  2007   [facility]  . CARDIOVERSION  08/11/2011   Procedure: CARDIOVERSION;  Surgeon: Lelon Perla, MD;  Location: [facility];  Service: Cardiovascular;  Laterality: N/A;  . CARDIOVERSION N/A 04/16/2017   Procedure: CARDIOVERSION;  Surgeon: Sanda Klein, MD;  Location: [facility] ENDOSCOPY;  Service: Cardiovascular;  Laterality: N/A;  . CARDIOVERSION N/A 03/04/2019   Procedure: CARDIOVERSION;  Surgeon: Lelon Perla,  MD;  Location: [facility];  Service: Cardiovascular;  Laterality: N/A;  . CARDIOVERSION N/A 07/04/2019   Procedure: CARDIOVERSION;  Surgeon: [name], MD;  Location: [facility] ENDOSCOPY;  Service: Cardiovascular;  Laterality: N/A;  . EYE SURGERY     lasik, 1998  . HERNIA REPAIR     1977  . INGUINAL HERNIA REPAIR Right 08/26/2012   Procedure: LAPAROSCOPIC INGUINAL HERNIA;  Surgeon: Madilyn Hook, DO;  Location: [facility];  Service: General;  Laterality: Right;  laparoscopic right inguinal hernia repair with mesh, umbilical hernia repair  . INSERTION OF MESH Right 08/26/2012   Procedure: INSERTION OF MESH;  Surgeon: Madilyn Hook, DO;  Location: [facility];  Service: General;  Laterality: Right;  right inguinal hernia  . TEE WITHOUT CARDIOVERSION N/A 03/04/2019   Procedure: TRANSESOPHAGEAL ECHOCARDIOGRAM (TEE);  Surgeon: Lelon Perla, MD;  Location: [facility];  Service: Cardiovascular;  Laterality: N/A;   . UMBILICAL HERNIA REPAIR N/A 08/26/2012   Procedure: HERNIA REPAIR UMBILICAL ADULT;  Surgeon: Madilyn Hook, DO;  Location: [facility] OR;  Service: General;  Laterality: N/A;    FAMILY HISTORY: Family History  Problem Relation Age of Onset  . Asthma Mother   . Breast cancer Mother   . Hypertension Mother   . COPD Father   . Prostate cancer Father   . Hypertension Father   . Lymphoma Sister     SOCIAL HISTORY: Social History   Socioeconomic History  . Marital status: Married    Spouse name: Not on file  . Number of children: 3  . Years of education: Not on file  . Highest education level: Not on file  Occupational History  . 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 Smoking status: Never Smoker  . Smokeless tobacco: Never Used  Vaping Use  . Vaping Use: Never used  Substance and Sexual Activity  . Alcohol use: No    Alcohol/week: 0.0 standard drinks  . Drug use: No  . Sexual activity: Yes  Other Topics Concern  . Not on file  Social History Narrative   Lives in [place] Determinants of Health   Financial Resource Strain: Not on file  Food Insecurity: Not on file  Transportation Needs: Not on file  Physical Activity: Not on file  Stress: Not on file  Social Connections: Not on file  Intimate Partner Violence: Not on file      PHYSICAL EXAM  There were no vitals filed for this visit. There is no height or weight on file to calculate BMI.  Generalized: Well developed, in no acute distress, central obesity,  Neurological examination  Mentation: Alert oriented to time, place, history taking. Follows all commands speech and language fluent Cranial nerve II-XII: Pupils were equal round reactive to light. Extraocular movements were full, visual field were full on confrontational test. Facial sensation and strength were normal. Uvula tongue midline. Head turning and shoulder shrug  were normal and symmetric. Motor: The motor testing reveals 5  over 5 strength of all 4 extremities. Good symmetric motor tone is noted throughout.  Sensory: Sensory testing is intact to soft touch on all 4 extremities. No evidence of extinction is noted.  Coordination: Cerebellar testing reveals good finger-nose-finger and heel-to-shin bilaterally.  Gait and station: Gait is normal.  Reflexes: Deep tendon reflexes are symmetric and normal bilaterally.   DIAGNOSTIC DATA (LABS, IMAGING, TESTING) - I reviewed patient records, labs, notes, testing and imaging myself where available.  Lab Results  Component Value Date  WBC 7.7 04/27/2020   HGB 13.7 04/27/2020   HCT 44.6 04/27/2020   MCV 88.7 04/27/2020   PLT 358 04/27/2020      Component Value Date/Time   NA 136 04/27/2020 0401   NA 141 06/27/2019 1602   K 3.4 (L) 04/27/2020 0401   CL 103 04/27/2020 0401   CO2 24 04/27/2020 0401   GLUCOSE 117 (H) 04/27/2020 0401   BUN 17 04/27/2020 0401   BUN 25 06/27/2019 1602   CREATININE 0.97 04/27/2020 0401   CREATININE 1.10 11/15/2015 0847   CALCIUM 8.6 (L) 04/27/2020 0401   PROT 7.2 04/27/2020 0401   ALBUMIN 3.0 (L) 04/27/2020 0401   AST 20 04/27/2020 0401   ALT 41 04/27/2020 0401   ALKPHOS 115 04/27/2020 0401   BILITOT 0.7 04/27/2020 0401   GFRNONAA >60 04/27/2020 0401   GFRAA 53 (L) 06/27/2019 1602   Lab Results  Component Value Date   CHOL  02/14/2009    126        ATP III CLASSIFICATION:  <200     mg/dL   Desirable  200-239  mg/dL   Borderline High  >=240    mg/dL   High          HDL 48 02/14/2009   LDLCALC  02/14/2009    59        Total Cholesterol/HDL:CHD Risk Coronary Heart Disease Risk Table                     Men   Women  1/2 Average Risk   3.4   3.3  Average Risk       5.0   4.4  2 X Average Risk   9.6   7.1  3 X Average Risk  23.4   11.0        Use the calculated Patient Ratio above and the CHD Risk Table to determine the patient's CHD Risk.        ATP III CLASSIFICATION (LDL):  <100     mg/dL   Optimal  100-129   mg/dL   Near or Above                    Optimal  130-159  mg/dL   Borderline  160-189  mg/dL   High  >190     mg/dL   Very High   TRIG 95 02/14/2009   CHOLHDL 2.6 02/14/2009   Lab Results  Component Value Date   HGBA1C 5.7 (H) 04/27/2020   Lab Results  Component Value Date   VITAMINB12 749 01/16/2020   Lab Results  Component Value Date   TSH 0.942 01/16/2020      ASSESSMENT AND PLAN 65 y.o. year old male  Uncontrollable body jerking movement Restless leg symptoms Risk for obstructive sleep apnea  Refer to sleep study  Laboratory evaluation including ferritin level  Will decrease his Requip only 2 mg at nighttime to avoid augmentation  He has been taking gabapentin 300 mg 2 to 4 tablets every night without helping his symptoms, will change to Lyrica up to 100 mg 3 times a day    Marcial Pacas, M.D. Ph.D.  [facility] [place], [place] [address] Phone: [phone] Fax:      [phone]

## 2020-09-10 ENCOUNTER — Ambulatory Visit: Payer: Self-pay | Admitting: Neurology

## 2020-10-15 ENCOUNTER — Telehealth: Payer: Self-pay | Admitting: *Deleted

## 2020-10-15 NOTE — Telephone Encounter (Signed)
Called patient back call went to his personal voice mail DOD Dr.Harding advised to go to ED.Called and spoke to patient's wife advised patient needs to go to ED.Stated she will let him know.

## 2020-10-15 NOTE — Telephone Encounter (Signed)
Patient returning call.

## 2020-10-15 NOTE — Telephone Encounter (Signed)
See telephone note 4/11

## 2020-10-15 NOTE — Telephone Encounter (Signed)
Returned call to patient he stated his B/P has been elevated for 1 month.Stated for the past 3 days worse.B/P ranging 197/137,180/115 Pulse 85.Stated he feels awful,achy all over has vomited x 3 this morning.Medications reviewed.Stated PCP stopped Lisinopril 1 month ago and started Olmesartan 40 mg daily.Stated he decreased Toprol XL 25 mg to 1/2 tablet daily.Stated he cannot take 50 mg causes pulse to be too low and sob.Stated he wants Dr.Crenshaw to manage B/P.Advised Dr.Crenshaw out of office.I will speak to DOD Dr.Harding for advice.

## 2020-10-15 NOTE — Telephone Encounter (Signed)
Spoke with wife and left message for patient to call back to discuss, asked to speak with triage

## 2020-10-15 NOTE — Telephone Encounter (Signed)
Left a message for pt to call back

## 2020-10-23 ENCOUNTER — Encounter: Payer: Self-pay | Admitting: *Deleted

## 2020-10-23 NOTE — Progress Notes (Deleted)
HPI: FU paroxysmal atrial fibrillation. The patient had a cardiac catheterization in 2004 that showed an ejection fraction of 45% and normal coronary arteries. He did have atrial fibrillation at that time and his LV function improved after sinus rhythm was restored. He ultimately had atrial fibrillation ablation. He did well for several yearsbut then recurrent atrial fibrillation. Transesophageal echocardiogram August 2020 showed normal LV function, severe left atrial enlargement, moderate to severe mitral regurgitation and mild tricuspid regurgitation.Patient had cardioversion March 04, 2019 but atrial fibrillation recurred. Patient seen by Dr. Rayann Heman and repeat ablation felt not indicated as chances of maintaining sinus would be lower given obesity and severe left atrial enlargement. Plan was to potentially consider referral for surgical Maze procedure and mitral valve repair. Follow-up transthoracic echocardiogram October 2020 showed normal LV function, moderate left atrial enlargement and trace mitral regurgitation. Patient subsequently placed on amiodarone in the atrial fibrillation clinic. Also referred for evaluation of sleep apnea.  Repeat attempt at cardioversion December 2020 unsuccessful.  He saw Dr. Rayann Heman in follow-up and plan is CVTS referral for consideration of surgical maze.    Dr. Roxy Manns evaluated patient and felt that medical therapy was best option.  Most recent echocardiogram July 2021 showed normal LV function, mild left ventricular hypertrophy, severe left atrial enlargement, mild mitral regurgitation.  Recently contacted the office with elevated blood pressure and was added to my schedule.  Since last seen,  Current Outpatient Medications  Medication Sig Dispense Refill  . ALPRAZolam (XANAX) 0.5 MG tablet TAKE 1 TABLET(0.5 MG) BY MOUTH AT BEDTIME AS NEEDED FOR ANXIETY OR SLEEP (Patient taking differently: Take 0.5 mg by mouth at bedtime as needed for anxiety or sleep. )  30 tablet 5  . diltiazem (CARDIZEM CD) 360 MG 24 hr capsule Take 1 capsule (360 mg total) by mouth daily. 90 capsule 0  . ELIQUIS 5 MG TABS tablet TAKE 1 TABLET(5 MG) BY MOUTH TWICE DAILY 60 tablet 11  . metoprolol succinate (TOPROL XL) 25 MG 24 hr tablet Take 1/2 tablet daily    . montelukast (SINGULAIR) 10 MG tablet Take 10 mg by mouth daily as needed (sob).     . olmesartan (BENICAR) 40 MG tablet Take 1 tablet (40 mg total) by mouth daily.    Marland Kitchen omeprazole (PRILOSEC) 20 MG capsule Take 20 mg by mouth daily as needed.    . ondansetron (ZOFRAN) 4 MG tablet Take 1 tablet (4 mg total) by mouth every 6 (six) hours as needed for nausea. 20 tablet 0  . pregabalin (LYRICA) 100 MG capsule Take 1 capsule (100 mg total) by mouth 3 (three) times daily. (Patient not taking: Reported on 04/25/2020) 90 capsule 5  . rOPINIRole (REQUIP) 1 MG tablet Take 1-2 mg by mouth at bedtime as needed (restless leg syndrome). 1 tablet after lunch and 2 tablet at bedtime    . sildenafil (REVATIO) 20 MG tablet Take 40-100 mg by mouth daily as needed (ED).     . traMADol (ULTRAM) 50 MG tablet Take 50 mg by mouth at bedtime as needed for pain.      No current facility-administered medications for this visit.     Past Medical History:  Diagnosis Date  . Allergic rhinitis   . Anxiety   . Asthma    as a child  . Chronic low back pain   . Complication of anesthesia    "hard to put asleep, hard to wake up, nausea and vomiting"  . Depression   .  ED (erectile dysfunction)   . GERD (gastroesophageal reflux disease)   . HLD (hyperlipidemia)   . HTN (hypertension)    sees Dr. [name], [facility]  . Hypertrophy of prostate with urinary obstruction and other lower urinary tract symptoms (LUTS)   . IBS (irritable bowel syndrome)   . Inguinal hernia without mention of obstruction or gangrene, unilateral or unspecified, (not specified as recurrent)   . Insomnia   . Lumbar herniated disc    L7  . Morbid obesity  ([place])   . Narcotic addiction ([place])   . NICM (nonischemic cardiomyopathy) (HCC)    tachycardia mediated,  resolved with sinus  . Persistent atrial fibrillation ([place])    ablation done 03/2006 at [facility]  . Pneumonia    hx of 2004  . PONV (postoperative nausea and vomiting)   . Twitching    legs    Past Surgical History:  Procedure Laterality Date  . ATRIAL ABLATION SURGERY  2007   [facility]  . CARDIOVERSION  08/11/2011   Procedure: CARDIOVERSION;  Surgeon: Lelon Perla, MD;  Location: [place];  Service: Cardiovascular;  Laterality: N/A;  . CARDIOVERSION N/A 04/16/2017   Procedure: CARDIOVERSION;  Surgeon: Sanda Klein, MD;  [facility] ENDOSCOPY;  Service: Cardiovascular;  Laterality: N/A;  . CARDIOVERSION N/A 03/04/2019   Procedure: CARDIOVERSION;  Surgeon: Lelon Perla, MD;  Location: [facility] ENDOSCOPY;  Service: Cardiovascular;  Laterality: N/A;  . CARDIOVERSION N/A 07/04/2019   Procedure: CARDIOVERSION;  Surgeon: [name], MD;  Location: [facility] ENDOSCOPY;  Service: Cardiovascular;  Laterality: N/A;  . EYE SURGERY     lasik, 1998  . HERNIA REPAIR     1977  . INGUINAL HERNIA REPAIR Right 08/26/2012   Procedure: LAPAROSCOPIC INGUINAL HERNIA;  Surgeon: Madilyn Hook, DO;  Location: [place];  Service: General;  Laterality: Right;  laparoscopic right inguinal hernia repair with mesh, umbilical hernia repair  . INSERTION OF MESH Right 08/26/2012   Procedure: INSERTION OF MESH;  Surgeon: Madilyn Hook, DO;  Location: [place];  Service: General;  Laterality: Right;  right inguinal hernia  . TEE WITHOUT CARDIOVERSION N/A 03/04/2019   Procedure: TRANSESOPHAGEAL ECHOCARDIOGRAM (TEE);  Surgeon: Lelon Perla, MD;  Location: [place];  Service: Cardiovascular;  Laterality: N/A;  . UMBILICAL HERNIA REPAIR N/A 08/26/2012   Procedure: HERNIA REPAIR UMBILICAL ADULT;  Surgeon: Madilyn Hook, DO;  Location: [place];  Service: General;  Laterality: N/A;    Social History   Socioeconomic History  .  Marital status: Married    Spouse name: Not on file  . Number of children: 3  . Years of education: Not on file  . Highest education level: Not on file  Occupational History  . 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 Smoking status: Never Smoker  . Smokeless tobacco: Never Used  Vaping Use  . Vaping Use: Never used  Substance and Sexual Activity  . Alcohol use: No    Alcohol/week: 0.0 standard drinks  . Drug use: No  . Sexual activity: Yes  Other Topics Concern  . Not on file  Social History Narrative   Lives in [place] Determinants of Health   Financial Resource Strain: Not on file  Food Insecurity: Not on file  Transportation Needs: Not on file  Physical Activity: Not on file  Stress: Not on file  Social Connections: Not on file  Intimate Partner Violence: Not on file    Family History  Problem Relation Age of Onset  .  Asthma Mother   . Breast cancer Mother   . Hypertension Mother   . COPD Father   . Prostate cancer Father   . Hypertension Father   . Lymphoma Sister     ROS: no fevers or chills, productive cough, hemoptysis, dysphasia, odynophagia, melena, hematochezia, dysuria, hematuria, rash, seizure activity, orthopnea, PND, pedal edema, claudication. Remaining systems are negative.  Physical Exam: Well-developed well-nourished in no acute distress.  Skin is warm and dry.  HEENT is normal.  Neck is supple.  Chest is clear to auscultation with normal expansion.  Cardiovascular exam is regular rate and rhythm.  Abdominal exam nontender or distended. No masses palpated. Extremities show no edema. neuro grossly intact  ECG- personally reviewed  A/P  1 persistent atrial fibrillation-patient remains in atrial fibrillation but symptoms have improved.  We will therefore plan rate control and anticoagulation.  Continue Cardizem, metoprolol for rate control.  Continue apixaban.  2 hypertension-  3 mitral regurgitation-mild on most  recent echocardiogram.  4 history of cardiomyopathy-LV function has improved on most recent echocardiogram.  5 chronic diastolic congestive heart failure-he appears to be euvolemic.  Continue Lasix at present dose.  6 obesity-needs further evaluation for sleep apnea and continued weight loss.  Kirk Ruths, MD

## 2020-10-23 NOTE — Telephone Encounter (Signed)
Follow up scheduled

## 2020-10-23 NOTE — Telephone Encounter (Signed)
Left message for pt to call.

## 2020-10-24 ENCOUNTER — Ambulatory Visit: Payer: 59 | Admitting: Cardiology

## 2020-10-24 ENCOUNTER — Encounter: Payer: Self-pay | Admitting: *Deleted

## 2020-10-29 NOTE — Progress Notes (Signed)
HPI: FU paroxysmal atrial fibrillation. The patient had a cardiac catheterization in 2004 that showed an ejection fraction of 45% and normal coronary arteries. He did have atrial fibrillation at that time and his LV function improved after sinus rhythm was restored. He ultimately had atrial fibrillation ablation. He did well for several yearsbut then recurrent atrial fibrillation. Transesophageal echocardiogram August 2020 showed normal LV function, severe left atrial enlargement, moderate to severe mitral regurgitation and mild tricuspid regurgitation.Patient had cardioversion March 04, 2019 but atrial fibrillation recurred. Patient seen by Dr. Rayann Heman and repeat ablation felt not indicated as chances of maintaining sinus would be lower given obesity and severe left atrial enlargement. Plan was to potentially consider referral for surgical Maze procedure and mitral valve repair. Follow-up transthoracic echocardiogram October 2020 showed normal LV function, moderate left atrial enlargement and trace mitral regurgitation. Patient subsequently placed on amiodarone in the atrial fibrillation clinic. Also referred for evaluation of sleep apnea.Repeat attempt at cardioversion December 2020 unsuccessful. Dr. Roxy Manns evaluated patient for consideration of surgical maze procedure and felt that medical therapy was best option. Most recent echocardiogram July 2021 showed normal LV function, mild left ventricular hypertrophy, severe left atrial enlargement, mild mitral regurgitation. Recently contacted the office with elevated blood pressure and was added to my schedule.  Since last seen,he states he has gained 50 pounds after losing weight previously.  Since that time he notes increased dyspnea with bending over but denies dyspnea on exertion.  No orthopnea, PND.  Occasional minimal pedal edema towards the end of the day.  He has not had chest pain, palpitations, syncope or bleeding.  Current Outpatient  Medications  Medication Sig Dispense Refill  . ALPRAZolam (XANAX) 0.5 MG tablet TAKE 1 TABLET(0.5 MG) BY MOUTH AT BEDTIME AS NEEDED FOR ANXIETY OR SLEEP (Patient taking differently: Take 0.5 mg by mouth at bedtime as needed for anxiety or sleep.) 30 tablet 5  . diltiazem (CARDIZEM CD) 360 MG 24 hr capsule Take 1 capsule (360 mg total) by mouth daily. 90 capsule 0  . ELIQUIS 5 MG TABS tablet TAKE 1 TABLET(5 MG) BY MOUTH TWICE DAILY 60 tablet 11  . hydrochlorothiazide (MICROZIDE) 12.5 MG capsule Take 12.5 mg by mouth every morning.    . metoprolol succinate (TOPROL-XL) 25 MG 24 hr tablet Take 1/2 tablet daily    . montelukast (SINGULAIR) 10 MG tablet Take 10 mg by mouth daily as needed (sob).     Marland Kitchen ofloxacin (OCUFLOX) 0.3 % ophthalmic solution Instill 1 drop TID in operative eye starting 2 days prior to surgery and after surgery for 3 weeks.    Marland Kitchen olmesartan (BENICAR) 40 MG tablet Take 40 mg by mouth 2 (two) times daily.    Marland Kitchen omeprazole (PRILOSEC) 20 MG capsule Take 20 mg by mouth daily as needed.    . ondansetron (ZOFRAN) 4 MG tablet Take 1 tablet (4 mg total) by mouth every 6 (six) hours as needed for nausea. 20 tablet 0  . pregabalin (LYRICA) 100 MG capsule Take 1 capsule (100 mg total) by mouth 3 (three) times daily. 90 capsule 5  . propranolol (INDERAL) 20 MG tablet Take 20 mg by mouth daily.    Marland Kitchen rOPINIRole (REQUIP) 1 MG tablet Take 1-2 mg by mouth at bedtime as needed (restless leg syndrome). 1 tablet after lunch and 2 tablet at bedtime    . tadalafil (CIALIS) 20 MG tablet Take 20 mg by mouth daily as needed.    . traMADol (ULTRAM) 50  MG tablet Take 50 mg by mouth at bedtime as needed for pain.      No current facility-administered medications for this visit.     Past Medical History:  Diagnosis Date  . Allergic rhinitis   . Anxiety   . Asthma    as a child  . Chronic low back pain   . Complication of anesthesia    "hard to put asleep, hard to wake up, nausea and vomiting"  .  Depression   . ED (erectile dysfunction)   . GERD (gastroesophageal reflux disease)   . HLD (hyperlipidemia)   . HTN (hypertension)    sees Dr. [name], [facility]  . Hypertrophy of prostate with urinary obstruction and other lower urinary tract symptoms (LUTS)   . IBS (irritable bowel syndrome)   . Inguinal hernia without mention of obstruction or gangrene, unilateral or unspecified, (not specified as recurrent)   . Insomnia   . Lumbar herniated disc    L7  . Morbid obesity ([place])   . Narcotic addiction ([place])   . NICM (nonischemic cardiomyopathy) (HCC)    tachycardia mediated,  resolved with sinus  . Persistent atrial fibrillation ([place])    ablation done 03/2006 at [facility]  . Pneumonia    hx of 2004  . PONV (postoperative nausea and vomiting)   . Twitching    legs    Past Surgical History:  Procedure Laterality Date  . ATRIAL ABLATION SURGERY  2007   [facility]  . CARDIOVERSION  08/11/2011   Procedure: CARDIOVERSION;  Surgeon: Lelon Perla, MD;  Location: [place];  Service: Cardiovascular;  Laterality: N/A;  . CARDIOVERSION N/A 04/16/2017   Procedure: CARDIOVERSION;  Surgeon: Sanda Klein, MD;  [facility] ENDOSCOPY;  Service: Cardiovascular;  Laterality: N/A;  . CARDIOVERSION N/A 03/04/2019   Procedure: CARDIOVERSION;  Surgeon: Lelon Perla, MD;  Location: [facility] ENDOSCOPY;  Service: Cardiovascular;  Laterality: N/A;  . CARDIOVERSION N/A 07/04/2019   Procedure: CARDIOVERSION;  Surgeon: [name], MD;  Location: [facility] ENDOSCOPY;  Service: Cardiovascular;  Laterality: N/A;  . EYE SURGERY     lasik, 1998  . HERNIA REPAIR     1977  . INGUINAL HERNIA REPAIR Right 08/26/2012   Procedure: LAPAROSCOPIC INGUINAL HERNIA;  Surgeon: Madilyn Hook, DO;  Location: [place];  Service: General;  Laterality: Right;  laparoscopic right inguinal hernia repair with mesh, umbilical hernia repair  . INSERTION OF MESH Right 08/26/2012   Procedure: INSERTION OF MESH;  Surgeon: Madilyn Hook,  DO;  Location: [place];  Service: General;  Laterality: Right;  right inguinal hernia  . TEE WITHOUT CARDIOVERSION N/A 03/04/2019   Procedure: TRANSESOPHAGEAL ECHOCARDIOGRAM (TEE);  Surgeon: Lelon Perla, MD;  Location: [place];  Service: Cardiovascular;  Laterality: N/A;  . UMBILICAL HERNIA REPAIR N/A 08/26/2012   Procedure: HERNIA REPAIR UMBILICAL ADULT;  Surgeon: Madilyn Hook, DO;  Location: [place];  Service: General;  Laterality: N/A;    Social History   Socioeconomic History  . Marital status: Married    Spouse name: Not on file  . Number of children: 3  . Years of education: Not on file  . Highest education level: Not on file  Occupational History  . 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 Smoking status: Never Smoker  . Smokeless tobacco: Never Used  Vaping Use  . Vaping Use: Never used  Substance and Sexual Activity  . Alcohol use: No    Alcohol/week: 0.0 standard drinks  . Drug use:  No  . Sexual activity: Yes  Other Topics Concern  . Not on file  Social History Narrative   Lives in [place] Determinants of Health   Financial Resource Strain: Not on file  Food Insecurity: Not on file  Transportation Needs: Not on file  Physical Activity: Not on file  Stress: Not on file  Social Connections: Not on file  Intimate Partner Violence: Not on file    Family History  Problem Relation Age of Onset  . Asthma Mother   . Breast cancer Mother   . Hypertension Mother   . COPD Father   . Prostate cancer Father   . Hypertension Father   . Lymphoma Sister     ROS: no fevers or chills, productive cough, hemoptysis, dysphasia, odynophagia, melena, hematochezia, dysuria, hematuria, rash, seizure activity, orthopnea, PND, pedal edema, claudication. Remaining systems are negative.  Physical Exam: Well-developed obese in no acute distress.  Skin is warm and dry.  HEENT is normal.  Neck is supple.  Chest is clear to auscultation with normal  expansion.  Cardiovascular exam is irregular Abdominal exam nontender or distended. No masses palpated. Extremities show trace edema. neuro grossly intact  ECG-atrial fibrillation at a rate of 104, RV conduction delay, no ST changes.  Personally reviewed  A/P  1 persistent atrial fibrillation-pt remains in atrial fibrillation; heart rate mildly elevated; he is taking Cardizem but only taking Toprol and propranolol as needed.  I will discontinue both Toprol and propranolol and add carvedilol 6.25 mg twice daily.  Follow heart rate and adjust as needed.  Continue apixaban.  We discussed rate control versus rhythm control today.  He would like to potentially try cardioversion once he loses weight.  I will see him back in 3 months and we will discuss this further.  I explained that he has been in this for approximately 1 year and it may be difficult to reestablish sinus rhythm.  If we decide to continue with rate control he will need a monitor in the future to make sure that his rate is adequate control.  2 hypertension-blood pressure has been running high.  He has been taking Benicar 40 mg twice daily.  I asked him to decrease this to 40 mg daily.  As outlined above we are discontinuing metoprolol and Inderal.  Add carvedilol 6.25 mg twice daily and increase as needed.  This would be more effective for blood pressure control if he tolerates.  Follow blood pressure at home.  3 mitral regurgitation-mild on most recent echocardiogram.  4 history of cardiomyopathy-LV function has improved on most recent echocardiogram.  5 chronic diastolic congestive heart failure-he appears to be euvolemic.  Continue Lasix at present dose.  6 obesity-needs further evaluation for sleep apnea and continued weight loss.  Kirk Ruths, MD

## 2020-10-30 ENCOUNTER — Ambulatory Visit: Payer: 59 | Admitting: Cardiology

## 2020-10-30 ENCOUNTER — Encounter: Payer: Self-pay | Admitting: Cardiology

## 2020-10-30 ENCOUNTER — Other Ambulatory Visit: Payer: Self-pay

## 2020-10-30 VITALS — BP 140/78 | HR 104 | Ht 77.0 in | Wt 330.0 lb

## 2020-10-30 DIAGNOSIS — I4819 Other persistent atrial fibrillation: Secondary | ICD-10-CM | POA: Diagnosis not present

## 2020-10-30 DIAGNOSIS — I10 Essential (primary) hypertension: Secondary | ICD-10-CM | POA: Diagnosis not present

## 2020-10-30 DIAGNOSIS — I420 Dilated cardiomyopathy: Secondary | ICD-10-CM

## 2020-10-30 MED ORDER — OLMESARTAN MEDOXOMIL 40 MG PO TABS: 40.0000 mg | ORAL_TABLET | Freq: Every day | ORAL | 3 refills | Status: DC

## 2020-10-30 MED ORDER — CARVEDILOL 6.25 MG PO TABS: 6.2500 mg | ORAL_TABLET | Freq: Two times a day (BID) | ORAL | 3 refills | Status: DC

## 2020-10-30 NOTE — Patient Instructions (Signed)
Medication Instructions:   STOP METOPROLOL  STOP PROPRANOLOL  DECREASE OLMESARTAN TO 40 MG ONCE DAILY  START CARVEDILOL 6.25 MG TWICE DAILY  *If you need a refill on your cardiac medications before your next appointment, please call your pharmacy*   Follow-Up: At [facility], you and your health needs are our priority.  As part of our continuing mission to provide you with exceptional heart care, we have created designated Provider Care Teams.  These Care Teams include your primary Cardiologist (physician) and Advanced Practice Providers (APPs -  Physician Assistants and Nurse Practitioners) who all work together to provide you with the care you need, when you need it.  We recommend signing up for the patient portal called "MyChart".  Sign up information is provided on this After Visit Summary.  MyChart is used to connect with patients for Virtual Visits (Telemedicine).  Patients are able to view lab/test results, encounter notes, upcoming appointments, etc.  Non-urgent messages can be sent to your provider as well.   To learn more about what you can do with MyChart, go to [email or web address].    Your next appointment:   3 month(s)  The format for your next appointment:   In Person  Provider:   Kirk Ruths, MD

## 2020-12-04 ENCOUNTER — Telehealth: Payer: Self-pay | Admitting: Cardiology

## 2020-12-04 ENCOUNTER — Encounter: Payer: Self-pay | Admitting: *Deleted

## 2020-12-04 DIAGNOSIS — I10 Essential (primary) hypertension: Secondary | ICD-10-CM

## 2020-12-04 MED ORDER — OLMESARTAN MEDOXOMIL 40 MG PO TABS: 40.0000 mg | ORAL_TABLET | Freq: Every day | ORAL | 3 refills | Status: DC

## 2020-12-04 MED ORDER — CARVEDILOL 12.5 MG PO TABS: 12.5000 mg | ORAL_TABLET | Freq: Two times a day (BID) | ORAL | 3 refills | Status: DC

## 2020-12-04 NOTE — Telephone Encounter (Signed)
Message sent to patient via my chart, as per previous message dr Stanford Breed wanted the patient to increase carvedilol to 12.5 mg twice daily. New script sent to the pharmacy

## 2020-12-04 NOTE — Telephone Encounter (Signed)
Unable to reach pt or leave a message mailbox is full 

## 2020-12-04 NOTE — Telephone Encounter (Signed)
    Pt c/o BP issue: STAT if pt c/o blurred vision, one-sided weakness or slurred speech  1. What are your last 5 BP readings? Average 180/105  2. Are you having any other symptoms (ex. Dizziness, headache, blurred vision, passed out)?   3. What is your BP issue? Pt would like to speak with Hilda Blades, he said his BP is not going down. He said his BP is averaging 180/105, he said he feels like his blood stream not getting enough oxygen

## 2020-12-04 NOTE — Telephone Encounter (Signed)
   *  STAT* If patient is at the pharmacy, call can be transferred to refill team.   1. Which medications need to be refilled? (please list name of each medication and dose if known) olmesartan (BENICAR) 40 MG tablet  2. Which pharmacy/location (including street and city if local pharmacy) is medication to be sent to? [place], [place]  3. Do they need a 30 day or 90 day supply? 90 days  Pt said when he picked up his medications they only gave him 30 days supply

## 2020-12-11 ENCOUNTER — Encounter: Payer: Self-pay | Admitting: *Deleted

## 2020-12-11 ENCOUNTER — Other Ambulatory Visit: Payer: Self-pay | Admitting: *Deleted

## 2020-12-11 DIAGNOSIS — I10 Essential (primary) hypertension: Secondary | ICD-10-CM

## 2020-12-11 MED ORDER — OLMESARTAN MEDOXOMIL 40 MG PO TABS: 40.0000 mg | ORAL_TABLET | Freq: Every day | ORAL | 3 refills | Status: DC

## 2020-12-11 MED ORDER — CARVEDILOL 25 MG PO TABS: 25.0000 mg | ORAL_TABLET | Freq: Two times a day (BID) | ORAL | 3 refills | Status: DC

## 2020-12-11 NOTE — Addendum Note (Signed)
Addended by: Cristopher Estimable on: 12/11/2020 07:50 AM   Modules accepted: Orders

## 2020-12-18 ENCOUNTER — Telehealth: Payer: Self-pay | Admitting: Neurology

## 2020-12-18 NOTE — Telephone Encounter (Signed)
Patient is requesting a provider switch from Dr. Krista Blue to Dr. Leta Baptist. He is being referred for involuntary jerking movement which he has previously been evaluated for by Dr. Krista Blue who recommended MRI of brain and sleep study, neither of which did the patient follow up on. Please advise if this is acceptable thank you.

## 2020-12-18 NOTE — Telephone Encounter (Signed)
                      Patient was a no-show to his sleep consult visit scheduled for today with Dr. Brett Fairy.  Patient's wife and patient showed up to 2:45 pm.  They are demanding the patient be seen today.  Since he is in extreme discomfort.  I have offered the patient another appointment, earliest was March 16 at 8:30 AM.  He stated, " I have to be in pain until then."  At that point I informed this visit is for a sleep consult.  They were confused as to why he was going to have a sleep consult completed.  Informed that because of the restless leg, Dr Krista Blue had suggested this due to the restless leg and wanting to rule out if there was any organic sleep concerns that could be making this worse.  Both the patient and wife do not feel a sleep consult is necessary and have asked that a appointment be made to address the neurology side.  Looked up and saw the patient is scheduled for an appointment on Monday 09/10/20 with Sarah at 3:15 p.m.  Advised most likely that is where they received the information of checking out at 2:45 PM was in reference to that appointment.  They were appreciative for our help and will be back on Monday.      Chart reviewed, since last office visit in July 2021, patient has multiple cancellation no showed appointment, not compliance with suggestion  I would suggest his primary care physician/referring physician refer him to different neurology practice, or a tertiary center.

## 2021-01-02 ENCOUNTER — Other Ambulatory Visit: Payer: Self-pay | Admitting: Family Medicine

## 2021-01-02 DIAGNOSIS — R259 Unspecified abnormal involuntary movements: Secondary | ICD-10-CM

## 2021-01-11 NOTE — Progress Notes (Signed)
HPI: FU atrial fibrillation. The patient had a cardiac catheterization in 2004 that showed an ejection fraction of 45% and normal coronary arteries. He did have atrial fibrillation at that time and his LV function improved after sinus rhythm was restored. He ultimately had atrial fibrillation ablation. He did well for several years but then recurrent atrial fibrillation. Transesophageal echocardiogram August 2020 showed normal LV function, severe left atrial enlargement, moderate to severe mitral regurgitation and mild tricuspid regurgitation. Patient had cardioversion March 04, 2019 but atrial fibrillation recurred. Patient seen by Dr. Rayann Heman and repeat ablation felt not indicated as chances of maintaining sinus would be lower given obesity and severe left atrial enlargement.  Plan was to potentially consider referral for surgical Maze procedure and mitral valve repair. Follow-up transthoracic echocardiogram October 2020 showed normal LV function, moderate left atrial enlargement and trace mitral regurgitation. Patient subsequently placed on amiodarone in the atrial fibrillation clinic. Also referred for evaluation of sleep apnea. Repeat attempt at cardioversion December 2020 unsuccessful. Dr. Roxy Manns evaluated patient for consideration of surgical maze procedure and felt that medical therapy was best option. Most recent echocardiogram July 2021 showed normal LV function, mild left ventricular hypertrophy, severe left atrial enlargement, mild mitral regurgitation. Since last seen, he notes dyspnea with more vigorous activity.  He also has orthopnea and has noticed bilateral lower extremity edema.  He denies chest pain or palpitations.  He did state that he took Ambien 1 evening and was groggy and had syncope.  Current Outpatient Medications  Medication Sig Dispense Refill   ALPRAZolam (XANAX) 0.5 MG tablet TAKE 1 TABLET(0.5 MG) BY MOUTH AT BEDTIME AS NEEDED FOR ANXIETY OR SLEEP (Patient taking  differently: Take 0.5 mg by mouth at bedtime as needed for anxiety or sleep.) 30 tablet 5   carvedilol (COREG) 25 MG tablet Take 1 tablet (25 mg total) by mouth 2 (two) times daily. 180 tablet 3   diltiazem (CARDIZEM CD) 360 MG 24 hr capsule Take 1 capsule (360 mg total) by mouth daily. 90 capsule 0   ELIQUIS 5 MG TABS tablet TAKE 1 TABLET(5 MG) BY MOUTH TWICE DAILY 60 tablet 11   hydrochlorothiazide (MICROZIDE) 12.5 MG capsule Take 12.5 mg by mouth every morning.     montelukast (SINGULAIR) 10 MG tablet Take 10 mg by mouth daily as needed (sob).      ofloxacin (OCUFLOX) 0.3 % ophthalmic solution Instill 1 drop TID in operative eye starting 2 days prior to surgery and after surgery for 3 weeks.     olmesartan (BENICAR) 40 MG tablet Take 1 tablet (40 mg total) by mouth daily. 90 tablet 3   omeprazole (PRILOSEC) 20 MG capsule Take 20 mg by mouth daily as needed.     ondansetron (ZOFRAN) 4 MG tablet Take 1 tablet (4 mg total) by mouth every 6 (six) hours as needed for nausea. 20 tablet 0   pregabalin (LYRICA) 100 MG capsule Take 1 capsule (100 mg total) by mouth 3 (three) times daily. 90 capsule 5   rOPINIRole (REQUIP) 1 MG tablet Take 1-2 mg by mouth at bedtime as needed (restless leg syndrome). 1 tablet after lunch and 2 tablet at bedtime     tadalafil (CIALIS) 20 MG tablet Take 20 mg by mouth daily as needed.     traMADol (ULTRAM) 50 MG tablet Take 50 mg by mouth at bedtime as needed for pain.      No current facility-administered medications for this visit.     Past  Medical History:  Diagnosis Date   Allergic rhinitis    Anxiety    Asthma    as a child   Chronic low back pain    Complication of anesthesia    "hard to put asleep, hard to wake up, nausea and vomiting"   Depression    ED (erectile dysfunction)    GERD (gastroesophageal reflux disease)    HLD (hyperlipidemia)    HTN (hypertension)    sees Dr. [name], [facility]   Hypertrophy of prostate with urinary  obstruction and other lower urinary tract symptoms (LUTS)    IBS (irritable bowel syndrome)    Inguinal hernia without mention of obstruction or gangrene, unilateral or unspecified, (not specified as recurrent)    Insomnia    Lumbar herniated disc    L7   Morbid obesity ([place])    Narcotic addiction ([place])    NICM (nonischemic cardiomyopathy) ([place])    tachycardia mediated,  resolved with sinus   Persistent atrial fibrillation ([place])    ablation done 03/2006 at [facility]   Pneumonia    hx of 2004   PONV (postoperative nausea and vomiting)    Twitching    legs    Past Surgical History:  Procedure Laterality Date   ATRI[facility]  [facility]   CARDIOVERSION  08/11/2011   Procedure: CARDIOVERSION;  Surgeon: Lelon Perla, MD;  Location: [place];  Service: Cardiovascular;  Laterality: N/A;   CARDIOVERSION N/A 04/16/2017   Procedure: CARDIOVERSION;  Surgeon: Sanda Klein, MD;  [facility] ENDOSCOPY;  Service: Cardiovascular;  Laterality: N/A;   CARDIOVERSION N/A 03/04/2019   Procedure: CARDIOVERSION;  Surgeon: Lelon Perla, MD;  Location: [facility] ENDOSCOPY;  Service: Cardiovascular;  Laterality: N/A;   CARDIOVERSION N/A 07/04/2019   Procedure: CARDIOVERSION;  Surgeon: [name], MD;  Location: [facility] ENDOSCOPY;  Service: Cardiovascular;  Laterality: N/A;   EYE SURGERY     lasik, [place] Right 08/26/2012   Procedure: LAPAROSCOPIC INGUINAL HERNIA;  Surgeon: Madilyn Hook, DO;  Location: [place];  Service: General;  Laterality: Right;  laparoscopic right inguinal hernia repair with mesh, umbilical hernia repair   INSERTION OF MESH Right 08/26/2012   Procedure: INSERTION OF MESH;  Surgeon: Madilyn Hook, DO;  Location: [place];  Service: General;  Laterality: Right;  right inguinal hernia   TEE WITHOUT CARDIOVERSION N/A 03/04/2019   Procedure: TRANSESOPHAGEAL ECHOCARDIOGRAM (TEE);  Surgeon: Lelon Perla, MD;  Location: [place];   Service: Cardiovascular;  Laterality: N/A;   UMBILICAL HERNIA REPAIR N/A 08/26/2012   Procedure: HERNIA REPAIR UMBILICAL ADULT;  Surgeon: Madilyn Hook, DO;  [facility];  Service: General;  Laterality: N/A;    Social History   Socioeconomic History   Marital status: Married    Spouse name: Not on file   Number of children: 3   Years of education: Not on file   Highest education level: Not on file  Occupational History   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  Tobacco Use   Smoking status: Never   Smokeless tobacco: Never  Vaping Use   Vaping Use: Never used  Substance and Sexual Activity   Alcohol use: No    Alcohol/week: 0.0 standard drinks   Drug use: No   Sexual activity: Yes  Other Topics Concern   Not on file  Social History Narrative   Lives in [place]  Financial Resource Strain: Not on file  Food Insecurity: Not on file  Transportation Needs: Not on file  Physical Activity: Not on file  Stress: Not on file  Social Connections: Not on file  Intimate Partner Violence: Not on file    Family History  Problem Relation Age of Onset   Asthma Mother    Breast cancer Mother    Hypertension Mother    COPD Father    Prostate cancer Father    Hypertension Father    Lymphoma Sister     ROS: no fevers or chills, productive cough, hemoptysis, dysphasia, odynophagia, melena, hematochezia, dysuria, hematuria, rash, seizure activity, claudication. Remaining systems are negative.  Physical Exam: Well-developed obese in no acute distress.  Skin is warm and dry.  HEENT is normal.  Neck is supple.  Chest is clear to auscultation with normal expansion.  Cardiovascular exam is irregular Abdominal exam nontender or distended. No masses palpated. Extremities show 2+ edema. neuro grossly intact  ECG-atrial fibrillation at a rate of 82, normal axis, RV conduction delay, PVC or aberrantly conducted beat.  Personally reviewed  A/P  1  persistent atrial fibrillation-plan to continue Cardizem and carvedilol for rate control.  Continue apixaban.  I think the likelihood of maintaining sinus rhythm is poor.  We will therefore continue rate control and anticoagulation.  2 hypertension-blood pressure is elevated.  I am adding a diuretic for acute on chronic diastolic congestive heart failure.  We will follow and adjust regimen as needed.  3 might regurgitation-mild on most recent echocardiogram.  4 cardiomyopathy-LV function has improved on most recent echocardiogram.  5 chronic diastolic congestive heart failure-patient is volume overloaded on examination.  Discontinue hydrochlorothiazide.  Add Demadex 20 mg twice daily for 2 days then 20 mg daily thereafter.  Add K-Dur 20 meq daily.  Check potassium and renal function 1 week.  Repeat echocardiogram.  We discussed fluid restriction and low-sodium diet.  6 obesity-discussed importance of weight loss.  7 syncope-he attributes this to taking Ambien as he was groggy that evening.  We will repeat echocardiogram to reassess LV function.  Kirk Ruths, MD

## 2021-01-17 ENCOUNTER — Other Ambulatory Visit: Payer: Self-pay

## 2021-01-17 ENCOUNTER — Ambulatory Visit (INDEPENDENT_AMBULATORY_CARE_PROVIDER_SITE_OTHER): Payer: 59 | Admitting: Cardiology

## 2021-01-17 ENCOUNTER — Encounter: Payer: Self-pay | Admitting: Cardiology

## 2021-01-17 VITALS — BP 146/88 | HR 82 | Ht 76.0 in | Wt 310.0 lb

## 2021-01-17 DIAGNOSIS — I4819 Other persistent atrial fibrillation: Secondary | ICD-10-CM

## 2021-01-17 DIAGNOSIS — I509 Heart failure, unspecified: Secondary | ICD-10-CM | POA: Diagnosis not present

## 2021-01-17 DIAGNOSIS — R0683 Snoring: Secondary | ICD-10-CM | POA: Diagnosis not present

## 2021-01-17 MED ORDER — TORSEMIDE 20 MG PO TABS: 20.0000 mg | ORAL_TABLET | Freq: Every day | ORAL | 3 refills | Status: DC

## 2021-01-17 MED ORDER — POTASSIUM CHLORIDE CRYS ER 20 MEQ PO TBCR: 20.0000 meq | EXTENDED_RELEASE_TABLET | Freq: Every day | ORAL | 3 refills | Status: DC

## 2021-01-17 NOTE — Patient Instructions (Addendum)
Medication Instructions:   STOP HCTZ  START TORSEMIDE 20 MG TWICE DAILY X 2 DAYS AND THEN TAKE ONE TABLET DAILY  START POTASSIUM CHLORIDE 20 MEQ ONCE DAILY   *If you need a refill on your cardiac medications before your next appointment, please call your pharmacy*   Lab Work:  Your physician recommends that you return for lab work in:ONE [place]  If you have labs (blood work) drawn today and your tests are completely normal, you will receive your results only by: [place] (if you have MyChart) OR A paper copy in the mail If you have any lab test that is abnormal or we need to change your treatment, we will call you to review the results.   Testing/Procedures:  Your physician has requested that you have an echocardiogram. Echocardiography is a painless test that uses sound waves to create images of your heart. It provides your doctor with information about the size and shape of your heart and how well your heart's chambers and valves are working. This procedure takes approximately one hour. There are no restrictions for this procedure. [place]   Follow-Up: At [facility], you and your health needs are our priority.  As part of our continuing mission to provide you with exceptional heart care, we have created designated Provider Care Teams.  These Care Teams include your primary Cardiologist (physician) and Advanced Practice Providers (APPs -  Physician Assistants and Nurse Practitioners) who all work together to provide you with the care you need, when you need it.  We recommend signing up for the patient portal called "MyChart".  Sign up information is provided on this After Visit Summary.  MyChart is used to connect with patients for Virtual Visits (Telemedicine).  Patients are able to view lab/test results, encounter notes, upcoming appointments, etc.  Non-urgent messages can be sent to your provider as well.   To learn more about what you can do with MyChart,  go to [email or web address].    Your next appointment:   4-6 week(s)  The format for your next appointment:   In Person  Provider:   You will see one of the following Advanced Practice Providers on your designated Care Team:   Sande Rives, PA-C Coletta Memos, FNP  Then, Kirk Ruths, MD will plan to see you again in 3 month(s).

## 2021-01-27 ENCOUNTER — Other Ambulatory Visit: Payer: 59

## 2021-02-06 ENCOUNTER — Encounter (HOSPITAL_COMMUNITY): Payer: Self-pay | Admitting: Cardiology

## 2021-02-06 ENCOUNTER — Other Ambulatory Visit (HOSPITAL_COMMUNITY): Payer: 59

## 2021-02-14 NOTE — Progress Notes (Deleted)
Cardiology Office Note:    Date:  02/14/2021   ID:  [name], DOB 01/10/1956, MRN [id number]  PCP:  [name], MD  Cardiologist:  Kirk Ruths, MD  Electrophysiologist:  None   Referring MD: [name], MD   Chief Complaint: follow-up of CHF  History of Present Illness:    [name] is a 65 y.o. male with a history of normal coronaries on cardiac catheterization in 2004, persistent atrial fibrillation on Eliquis, non-ischemic cardiomyopathy/ chronic diastolic CHF with EF of as low as 45% in the past but 55-60% on last Echo in 01/2020, hypertension, hyperlipidemia, IBS, GERD, chronic back pain, and morbid obesity who is followed by Dr. Stanford Breed and presents today for follow-up of CHF.  Patient has long history of atrial fibrillation. Underwent ablation in 2007 and did well for several years but then had recurrence of atrial fibrillation. He has had multiple DCCV since then. TEE in 02/2019 showed normal LV function, severe left atrial enlargement, moderate to severe MR, and mild TR. He underwent DCCV at that time but again had recurrent atrial fibrillation. She was seen by Dr. Rayann Heman who did not recommend repeat ablation as chances of maintaining sinus are lower given obesity and severe left atrial enlargement. Plan was to consider referral for surgical MAZE procedure and mitral valve repair. However, follow-up TTE in 04/2019 showed normal LV function, moderate left atrial enlargement and trace MR. Patient was subsequently seen in the A.Fib Clinic and placed on Amiodarone. He had repeat DCCV in 06/2019 which was unsuccessful. Amiodarone was eventually stopped.He was seen by Dr. Roxy Manns in 2021 for consideration of surgical MAZE procedure but it was felt that continued medical therapy was the best option. Most recent Echo in 01/2020 showed LVEF of 55-60% with normal wall motion, severely dilated left atrium, and mild MR.  Patient was last seen by Dr. Stanford Breed in 01/17/2021 at  which time he reported dyspnea with more vigorous activity as well as orthopnea and lower extremity edema. He also reportedly had a syncopal episode after taking Ambien and becoming very groggy. He was volume overloaded on exam. HCTZ was stopped and he was started on Torsemide '20mg'$  twice daily for 2 days then '20mg'$  daily after that. Echo was ordered for further evaluation but has not been done yet.  Patient presents today for follow-up. ***  Non-Ischemic Cardiomyopathy Chronic Diastolic CHF - Patient seen last month in clinic and had evidence of volume overloaded. *** - Echo on 02/27/2021 showed *** - Continue Torsemide '20mg'$  daily and potassium chloride 20 mEq daily. - Continue Olmesartan '40mg'$  daily and Coreg '25mg'$  twice daily. - Discussed importance of daily weights and strict I/Os.  - Will recheck BMET today.  Persistent Atrial Fibrillation - *** - Continue Coreg '25mg'$  twice daily and Cardizem CD '360mg'$  daily. - Continue chronic anticoagulation with Eliquis '5mg'$  twice daily.  Hypertension - *** - Continue current medications: Olmesartan '40mg'$  daily, Coreg '25mg'$  twice daily, and Cardizem '360mg'$  daily.  Hyperlipidemia - History of hyperlipidemia in chart but not on any medications at home. - ***  Syncope  - Patient reported a syncopal episode at last visit in the setting of taking Ambien.  - ***  Past Medical History:  Diagnosis Date   Allergic rhinitis    Anxiety    Asthma    as a child   Chronic low back pain    Complication of anesthesia    "hard to put asleep, hard to wake up, nausea and vomiting"  Depression    ED (erectile dysfunction)    GERD (gastroesophageal reflux disease)    HLD (hyperlipidemia)    HTN (hypertension)    sees Dr. [name], [facility]   Hypertrophy of prostate with urinary obstruction and other lower urinary tract symptoms (LUTS)    IBS (irritable bowel syndrome)    Inguinal hernia without mention of obstruction or gangrene, unilateral or  unspecified, (not specified as recurrent)    Insomnia    Lumbar herniated disc    L7   Morbid obesity ([place])    Narcotic addiction ([place])    NICM (nonischemic cardiomyopathy) ([place])    tachycardia mediated,  resolved with sinus   Persistent atrial fibrillation ([place])    ablation done 03/2006 at [facility]   Pneumonia    hx of 2004   PONV (postoperative nausea and vomiting)    Twitching    legs    Past Surgical History:  Procedure Laterality Date   ATRIAL ABLATION SURGERY  2007   [facility]   CARDIOVERSION  08/11/2011   Procedure: CARDIOVERSION;  Surgeon: Lelon Perla, MD;  Location: [place];  Service: Cardiovascular;  Laterality: N/A;   CARDIOVERSION N/A 04/16/2017   Procedure: CARDIOVERSION;  Surgeon: Sanda Klein, MD;  [facility]OS[facility];  Service: Cardiovascular;  Laterality: N/A;   CARDIOVERSION N/A 03/04/2019   Procedure: CARDIOVERSION;  Surgeon: Lelon Perla, MD;  Location: [facility] ENDOSCOPY;  Service: Cardiovascular;  Laterality: N/A;   CARDIOVERSION N/A 07/04/2019   Procedure: CARDIOVERSION;  Surgeon: [name], MD;  Location: [facility] ENDOSCOPY;  Service: Cardiovascular;  Laterality: N/A;   EYE SURGERY     lasik, [place] Right 08/26/2012   Procedure: LAPAROSCOPIC INGUINAL HERNIA;  Surgeon: Madilyn Hook, DO;  Location: [place];  Service: General;  Laterality: Right;  laparoscopic right inguinal hernia repair with mesh, umbilical hernia repair   INSERTION OF MESH Right 08/26/2012   Procedure: INSERTION OF MESH;  Surgeon: Madilyn Hook, DO;  Location: [place];  Service: General;  Laterality: Right;  right inguinal hernia   TEE WITHOUT CARDIOVERSION N/A 03/04/2019   Procedure: TRANSESOPHAGEAL ECHOCARDIOGRAM (TEE);  Surgeon: Lelon Perla, MD;  Location: [place];  Service: Cardiovascular;  Laterality: N/A;   UMBILICAL HERNIA REPAIR N/A 08/26/2012   Procedure: HERNIA REPAIR UMBILICAL ADULT;  Surgeon: Madilyn Hook, DO;  Location: [place];   Service: General;  Laterality: N/A;    Current Medications: No outpatient medications have been marked as taking for the 02/28/21 encounter (Appointment) with Darreld Mclean, PA-C.     Allergies:   Hydrocodone   Social History   Socioeconomic History   Marital status: Married    Spouse name: Not on file   Number of children: 3   Years of education: Not on file   Highest education level: Not on file  Occupational History   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  Tobacco Use   Smoking status: Never   Smokeless tobacco: Never  Vaping Use   Vaping Use: Never used  Substance and Sexual Activity   Alcohol use: No    Alcohol/week: 0.0 standard drinks   Drug use: No   Sexual activity: Yes  Other Topics Concern   Not on file  Social History Narrative   Lives in [place] Determinants of Health   Financial Resource Strain: Not on file  Food Insecurity: Not on file  Transportation Needs: Not on file  Physical Activity:  Not on file  Stress: Not on file  Social Connections: Not on file     Family History: The patient's family history includes Asthma in his mother; Breast cancer in his mother; COPD in his father; Hypertension in his father and mother; Lymphoma in his sister; Prostate cancer in his father.  ROS:   Please see the history of present illness.     EKGs/Labs/Other Studies Reviewed:    The following studies were reviewed today:  Echocardiogram 8/42/2022: ***  EKG:  EKG not ordered today.   Recent Labs: 04/27/2020: ALT 41; BUN 17; Creatinine, Ser 0.97; Hemoglobin 13.7; Magnesium 2.0; Platelets 358; Potassium 3.4; Sodium 136  Recent Lipid Panel    Component Value Date/Time   CHOL  02/14/2009 0630    126        ATP III CLASSIFICATION:  <200     mg/dL   Desirable  200-239  mg/dL   Borderline High  >=240    mg/dL   High          TRIG 95 02/14/2009 0630   HDL 48 02/14/2009 0630   CHOLHDL 2.6 02/14/2009 0630   VLDL 19 02/14/2009 0630    LDLCALC  02/14/2009 0630    59        Total Cholesterol/HDL:CHD Risk Coronary Heart Disease Risk Table                     Men   Women  1/2 Average Risk   3.4   3.3  Average Risk       5.0   4.4  2 X Average Risk   9.6   7.1  3 X Average Risk  23.4   11.0        Use the calculated Patient Ratio above and the CHD Risk Table to determine the patient's CHD Risk.        ATP III CLASSIFICATION (LDL):  <100     mg/dL   Optimal  100-129  mg/dL   Near or Above                    Optimal  130-159  mg/dL   Borderline  160-189  mg/dL   High  >190     mg/dL   Very High    Physical Exam:    Vital Signs: There were no vitals taken for this visit.    Wt Readings from Last 3 Encounters:  01/17/21 (!) 310 lb (140.6 kg)  10/30/20 (!) 330 lb (149.7 kg)  04/24/20 281 lb 1.4 oz (127.5 kg)     General: 65 y.o. male in no acute distress. HEENT: Normocephalic and atraumatic. Sclera clear. EOMs intact. Neck: Supple. No carotid bruits. No JVD. Heart: *** RRR. Distinct S1 and S2. No murmurs, gallops, or rubs. Radial and distal pedal pulses 2+ and equal bilaterally. Lungs: No increased work of breathing. Clear to ausculation bilaterally. No wheezes, rhonchi, or rales.  Abdomen: Soft, non-distended, and non-tender to palpation. Bowel sounds present in all 4 quadrants.  MSK: Normal strength and tone for age. *** Extremities: No lower extremity edema.    Skin: Warm and dry. Neuro: Alert and oriented x3. No focal deficits. Psych: Normal affect. Responds appropriately.   Assessment:    No diagnosis found.  Plan:     Disposition: Follow up in ***   Medication Adjustments/Labs and Tests Ordered: Current medicines are reviewed at length with the patient today.  Concerns regarding medicines are outlined above.  No orders of the defined types were placed in this encounter.  No orders of the defined types were placed in this encounter.   There are no Patient Instructions on file for this  visit.   Signed, Darreld Mclean, PA-C  02/14/2021 5:42 PM    [facility]

## 2021-02-21 ENCOUNTER — Other Ambulatory Visit: Payer: Self-pay

## 2021-02-21 ENCOUNTER — Ambulatory Visit: Payer: 59 | Admitting: Podiatry

## 2021-02-21 DIAGNOSIS — M79674 Pain in right toe(s): Secondary | ICD-10-CM | POA: Diagnosis not present

## 2021-02-21 DIAGNOSIS — M79675 Pain in left toe(s): Secondary | ICD-10-CM

## 2021-02-21 DIAGNOSIS — B351 Tinea unguium: Secondary | ICD-10-CM

## 2021-02-21 DIAGNOSIS — D689 Coagulation defect, unspecified: Secondary | ICD-10-CM | POA: Diagnosis not present

## 2021-02-21 DIAGNOSIS — L600 Ingrowing nail: Secondary | ICD-10-CM

## 2021-02-21 NOTE — Progress Notes (Signed)
Subjective:   Patient ID: [name], male   DOB: 65 y.o.   MRN: [id number]   HPI Patient presents stating that he somehow traumatized the big toenail on his left foot or [place] chronic painful and not attached to underlying  In regards ingrown toenails of all his nails that get sore they get thick and he is on a blood thinner and at times it hard to cut with obesity is complicating factor.  Patient does not smoke would like to be active   Review of Systems  All other systems reviewed and are negative.      Objective:  Physical Exam Vitals and nursing note reviewed.  Constitutional:      Appearance: He is well-developed.  Pulmonary:     Effort: Pulmonary effort is normal.  Musculoskeletal:        General: Normal range of motion.  Skin:    General: Skin is warm.  Neurological:     Mental Status: He is alert.    Neurovascular status was found to be intact muscle strength is found to be adequate range of motion adequate of the subtalar midtarsal joint.  Patient does have a damaged left hallux nail from probable injury and it is moderately sore with loose and has nail beds that are thickened and get irritated in the corners secondary to the way they grow.  Patient does have good digital perfusion well oriented x3     Assessment:  Chronic damaged left hallux nail that is loose and painful along with mycotic nail infection with discomfort of the remaining nails with obesity and blood thinner is complicating issues     Plan:  H&P all conditions reviewed.  I recommended removal of the left big toenail and allowing it to regrow even though it may require permanent procedure someday and today I infiltrated 60 mg like Marcaine mixture sterile prep applied and using sterile instrumentation removed the hallux nail flushed out the bed applied sterile dressing.  Debrided all remaining nails and no iatrogenic bleeding and he will be seen back as needed and may require permanent  procedure

## 2021-02-21 NOTE — Patient Instructions (Signed)

## 2021-02-27 ENCOUNTER — Other Ambulatory Visit (HOSPITAL_COMMUNITY): Payer: 59

## 2021-02-27 ENCOUNTER — Encounter (HOSPITAL_COMMUNITY): Payer: Self-pay | Admitting: Cardiology

## 2021-02-28 ENCOUNTER — Ambulatory Visit: Payer: 59 | Admitting: Student

## 2021-03-13 ENCOUNTER — Telehealth (HOSPITAL_COMMUNITY): Payer: Self-pay | Admitting: Cardiology

## 2021-03-13 NOTE — Telephone Encounter (Signed)
Just an FYI. We have made several attempts to contact this patient including sending a letter to schedule or reschedule their echocardiogram. We will be removing the patient from the echo [place].  02/27/21  NO SHOWED x 2 -MAILED LETTER [facility]  02/06/21 NO SHOWED-MAILED LETTER [facility]      Thank you

## 2021-03-13 NOTE — Telephone Encounter (Signed)
Will forward this message to the pts ordering Provider and covering RN, as a general FYI.

## 2021-03-19 ENCOUNTER — Encounter: Payer: Self-pay | Admitting: Physical Medicine and Rehabilitation

## 2021-04-02 ENCOUNTER — Ambulatory Visit (HOSPITAL_COMMUNITY): Payer: 59 | Attending: Cardiology

## 2021-04-02 ENCOUNTER — Other Ambulatory Visit: Payer: Self-pay

## 2021-04-02 DIAGNOSIS — I5032 Chronic diastolic (congestive) heart failure: Secondary | ICD-10-CM

## 2021-04-02 DIAGNOSIS — I509 Heart failure, unspecified: Secondary | ICD-10-CM | POA: Insufficient documentation

## 2021-04-02 LAB — ECHOCARDIOGRAM COMPLETE: S' Lateral: 3.7 cm

## 2021-04-08 ENCOUNTER — Other Ambulatory Visit: Payer: Self-pay

## 2021-04-08 ENCOUNTER — Encounter: Payer: 59 | Admitting: Physical Medicine and Rehabilitation

## 2021-04-09 NOTE — Progress Notes (Signed)
HPI: FU atrial fibrillation. The patient had a cardiac catheterization in 2004 that showed an ejection fraction of 45% and normal coronary arteries. He did have atrial fibrillation at that time and his LV function improved after sinus rhythm was restored. He ultimately had atrial fibrillation ablation. He did well for several years but then recurrent atrial fibrillation. Transesophageal echocardiogram August 2020 showed normal LV function, severe left atrial enlargement, moderate to severe mitral regurgitation and mild tricuspid regurgitation. Patient had cardioversion March 04, 2019 but atrial fibrillation recurred. Patient seen by Dr. Rayann Heman and repeat ablation felt not indicated as chances of maintaining sinus would be lower given obesity and severe left atrial enlargement.  Plan was to potentially consider referral for surgical Maze procedure and mitral valve repair. Follow-up transthoracic echocardiogram October 2020 showed normal LV function, moderate left atrial enlargement and trace mitral regurgitation. Patient subsequently placed on amiodarone in the atrial fibrillation clinic. Also referred for evaluation of sleep apnea. Repeat attempt at cardioversion December 2020 unsuccessful. Dr. Roxy Manns evaluated patient for consideration of surgical maze procedure and felt that medical therapy was best option. Most recent echocardiogram 9/22 showed normal LV function, mild LVH, mild RVE, mild LAE, mild to moderate MR.. At last ov, demadex added for volume excess. FU BMET ordered but not performed. Since last seen, patient complains of fatigue and dyspnea on exertion.  No orthopnea or PND.  He has bilateral lower extremity edema.  He did increase his Demadex to 20 mg twice daily by himself 1 week ago and there has been some improvement since then.  He denies chest pain, syncope.  He has dizziness in all positions.  He denies bleeding.  Current Outpatient Medications  Medication Sig Dispense Refill    ALPRAZolam (XANAX) 0.5 MG tablet TAKE 1 TABLET(0.5 MG) BY MOUTH AT BEDTIME AS NEEDED FOR ANXIETY OR SLEEP (Patient taking differently: Take 0.5 mg by mouth at bedtime as needed for anxiety or sleep.) 30 tablet 5   carvedilol (COREG) 25 MG tablet Take 1 tablet (25 mg total) by mouth 2 (two) times daily. 180 tablet 3   diltiazem (CARDIZEM CD) 360 MG 24 hr capsule Take 1 capsule (360 mg total) by mouth daily. 90 capsule 0   DULoxetine (CYMBALTA) 60 MG capsule SMARTSIG:1 Capsule(s) By Mouth Every Evening     ELIQUIS 5 MG TABS tablet TAKE 1 TABLET(5 MG) BY MOUTH TWICE DAILY 60 tablet 11   methocarbamol (ROBAXIN) 750 MG tablet Take 750 mg by mouth at bedtime as needed.     montelukast (SINGULAIR) 10 MG tablet Take 10 mg by mouth daily as needed (sob).      ofloxacin (OCUFLOX) 0.3 % ophthalmic solution Instill 1 drop TID in operative eye starting 2 days prior to surgery and after surgery for 3 weeks.     olmesartan (BENICAR) 40 MG tablet Take 1 tablet (40 mg total) by mouth daily. 90 tablet 3   omeprazole (PRILOSEC) 20 MG capsule Take 20 mg by mouth daily as needed.     ondansetron (ZOFRAN) 4 MG tablet Take 1 tablet (4 mg total) by mouth every 6 (six) hours as needed for nausea. 20 tablet 0   potassium chloride SA (KLOR-CON) 20 MEQ tablet Take 1 tablet (20 mEq total) by mouth daily. 90 tablet 3   pregabalin (LYRICA) 100 MG capsule Take 1 capsule (100 mg total) by mouth 3 (three) times daily. 90 capsule 5   rOPINIRole (REQUIP) 1 MG tablet Take 1-2 mg by mouth at  bedtime as needed (restless leg syndrome). 1 tablet after lunch and 2 tablet at bedtime     tadalafil (CIALIS) 20 MG tablet Take 20 mg by mouth daily as needed.     torsemide (DEMADEX) 20 MG tablet Take 1 tablet (20 mg total) by mouth daily. 94 tablet 3   traMADol (ULTRAM) 50 MG tablet Take 50 mg by mouth at bedtime as needed for pain.      No current facility-administered medications for this visit.     Past Medical History:  Diagnosis Date    Allergic rhinitis    Anxiety    Asthma    as a child   Chronic low back pain    Complication of anesthesia    "hard to put asleep, hard to wake up, nausea and vomiting"   Depression    ED (erectile dysfunction)    GERD (gastroesophageal reflux disease)    HLD (hyperlipidemia)    HTN (hypertension)    sees Dr. [name], [facility]   Hypertrophy of prostate with urinary obstruction and other lower urinary tract symptoms (LUTS)    IBS (irritable bowel syndrome)    Inguinal hernia without mention of obstruction or gangrene, unilateral or unspecified, (not specified as recurrent)    Insomnia    Lumbar herniated disc    L7   Morbid obesity ([place])    Narcotic addiction ([place])    NICM (nonischemic cardiomyopathy) ([place])    tachycardia mediated,  resolved with sinus   Persistent atrial fibrillation ([place])    ablation done 03/2006 at [facility]   Pneumonia    hx of 2004   PONV (postoperative nausea and vomiting)    Twitching    legs    Past Surgical History:  Procedure Laterality Date   ATRIAL ABLATION SURGERY  2007   [facility]   CARDIOVERSION  08/11/2011   Procedure: CARDIOVERSION;  Surgeon: Lelon Perla, MD;  Location: [place];  Service: Cardiovascular;  Laterality: N/A;   CARDIOVERSION N/A 04/16/2017   Procedure: CARDIOVERSION;  Surgeon: Sanda Klein, MD;  Location: [facility] ENDOSCOPY;  Service: Cardiovascular;  Laterality: N/A;   CARDIOVERSION N/A 03/04/2019   Procedure: CARDIOVERSION;  Surgeon: Lelon Perla, MD;  Location: [facility] ENDOSCOPY;  Service: Cardiovascular;  Laterality: N/A;   CARDIOVERSION N/A 07/04/2019   Procedure: CARDIOVERSION;  Surgeon: [name], MD;  Location: [facility] ENDOSCOPY;  Service: Cardiovascular;  Laterality: N/A;   EYE SURGERY     lasik, [place] Right 08/26/2012   Procedure: LAPAROSCOPIC INGUINAL HERNIA;  Surgeon: Madilyn Hook, DO;  Location: [place];  Service: General;  Laterality: Right;  laparoscopic  right inguinal hernia repair with mesh, umbilical hernia repair   INSERTION OF MESH Right 08/26/2012   Procedure: INSERTION OF MESH;  Surgeon: Madilyn Hook, DO;  Location: [place];  Service: General;  Laterality: Right;  right inguinal hernia   TEE WITHOUT CARDIOVERSION N/A 03/04/2019   Procedure: TRANSESOPHAGEAL ECHOCARDIOGRAM (TEE);  Surgeon: Lelon Perla, MD;  Location: [place];  Service: Cardiovascular;  Laterality: N/A;   UMBILICAL HERNIA REPAIR N/A 08/26/2012   Procedure: HERNIA REPAIR UMBILICAL ADULT;  Surgeon: Madilyn Hook, DO;  Location: [facility];  Service: General;  Laterality: N/A;    Social History   Socioeconomic History   Marital status: Married    Spouse name: Not on file   Number of children: 3   Years of education: Not on file   Highest education  level: Not on file  Occupational History   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  Tobacco Use   Smoking status: Never   Smokeless tobacco: Never  Vaping Use   Vaping Use: Never used  Substance and Sexual Activity   Alcohol use: No    Alcohol/week: 0.0 standard drinks   Drug use: No   Sexual activity: Yes  Other Topics Concern   Not on file  Social History Narrative   Lives in [place] Determinants of Health   Financial Resource Strain: Not on file  Food Insecurity: Not on file  Transportation Needs: Not on file  Physical Activity: Not on file  Stress: Not on file  Social Connections: Not on file  Intimate Partner Violence: Not on file    Family History  Problem Relation Age of Onset   Asthma Mother    Breast cancer Mother    Hypertension Mother    COPD Father    Prostate cancer Father    Hypertension Father    Lymphoma Sister     ROS: Fatigue but no fevers or chills, productive cough, hemoptysis, dysphasia, odynophagia, melena, hematochezia, dysuria, hematuria, rash, seizure activity, orthopnea, PND, claudication. Remaining systems are negative.  Physical Exam: Well-developed obese in  no acute distress.  Skin is warm and dry.  HEENT is normal.  Neck is supple.  Chest is clear to auscultation with normal expansion.  Cardiovascular exam is irregular Abdominal exam nontender or distended. No masses palpated. Extremities show 2+ edema. neuro grossly intact  ECG-atrial fibrillation at a rate of 79, normal axis, no ST changes.  Personally reviewed  A/P  1 permanent atrial fibrillation-continue cardizem, coreg and apixaban.  I have felt the likelihood of maintaining sinus rhythm is poor.  We will therefore continue rate control and anticoagulation.   2 hypertension-blood pressure is controlled but he states at home it is 150/90.  We will continue present medications.  Have asked him to bring his blood pressure cuff at next office visit and we will correlate with ours to make sure it is accurate.  Check potassium and renal function.   3 might regurgitation-mild to moderate on most recent echocardiogram.   4 cardiomyopathy-LV function has improved on most recent echocardiogram.   5 chronic diastolic congestive heart failure-patient is volume overloaded on examination.  There is been some improvement since increasing his Demadex to 20 mg twice daily.  Will treat with 40 mg in the morning and 20 in the afternoon for 2 days then resume 20 mg twice daily.  Check potassium, renal function and BNP.  He also has complained of fatigue.  Will check hemoglobin and TSH.  We will also consider addition of Jardiance and spironolactone at next office visit pending follow-up laboratories today.   6 obesity-we again discussed importance of weight loss.   7 syncope-no recurrences.  Kirk Ruths, MD

## 2021-04-10 ENCOUNTER — Other Ambulatory Visit: Payer: Self-pay

## 2021-04-10 ENCOUNTER — Encounter: Payer: Self-pay | Admitting: Cardiology

## 2021-04-10 ENCOUNTER — Ambulatory Visit (INDEPENDENT_AMBULATORY_CARE_PROVIDER_SITE_OTHER): Payer: 59 | Admitting: Cardiology

## 2021-04-10 ENCOUNTER — Other Ambulatory Visit: Payer: Self-pay | Admitting: *Deleted

## 2021-04-10 VITALS — BP 114/72 | HR 77 | Ht 76.0 in | Wt 339.4 lb

## 2021-04-10 DIAGNOSIS — I10 Essential (primary) hypertension: Secondary | ICD-10-CM

## 2021-04-10 DIAGNOSIS — I4819 Other persistent atrial fibrillation: Secondary | ICD-10-CM

## 2021-04-10 DIAGNOSIS — I420 Dilated cardiomyopathy: Secondary | ICD-10-CM

## 2021-04-10 DIAGNOSIS — I5032 Chronic diastolic (congestive) heart failure: Secondary | ICD-10-CM

## 2021-04-10 NOTE — Patient Instructions (Signed)
Medication Instructions:   CONTINUE TORSEMIDE 20 MG TWICE DAILY-TAKE 2 TABLETS IN THE MORNING AND 1 TABLET IN THE AFTERNOON FOR THE NEXT 2 DAYS  *If you need a refill on your cardiac medications before your next appointment, please call your pharmacy*   Lab Work:  Your physician recommends that you HAVE LAB WORK TODAY  If you have labs (blood work) drawn today and your tests are completely normal, you will receive your results only by: MyChart Message (if you have MyChart) OR A paper copy in the mail If you have any lab test that is abnormal or we need to change your treatment, we will call you to review the results.   Follow-Up: At [facility], you and your health needs are our priority.  As part of our continuing mission to provide you with exceptional heart care, we have created designated Provider Care Teams.  These Care Teams include your primary Cardiologist (physician) and Advanced Practice Providers (APPs -  Physician Assistants and Nurse Practitioners) who all work together to provide you with the care you need, when you need it.  We recommend signing up for the patient portal called "MyChart".  Sign up information is provided on this After Visit Summary.  MyChart is used to connect with patients for Virtual Visits (Telemedicine).  Patients are able to view lab/test results, encounter notes, upcoming appointments, etc.  Non-urgent messages can be sent to your provider as well.   To learn more about what you can do with MyChart, go to [email or web address].    Your next appointment:   6 month(s)  The format for your next appointment:   In Person  Provider:   Kirk Ruths, MD

## 2021-04-15 ENCOUNTER — Telehealth: Payer: Self-pay | Admitting: Cardiology

## 2021-04-15 NOTE — Telephone Encounter (Signed)
Left a message for the patient to call back to clarify his carvedilol dose.

## 2021-04-15 NOTE — Telephone Encounter (Signed)
Pt c/o medication issue:  1. Name of Medication: carvedilol (COREG) 25 MG tablet  2. How are you currently taking this medication (dosage and times per day)? Patient is taking 2 tablets (50 mg total) by mouth 2 (two) times daily.  3. Are you having a reaction (difficulty breathing--STAT)? No   4. What is your medication issue? Patient is supposed to be taking 1 tablet (25 mg total) by mouth 2 (two) times daily. Patient is currently out of medication

## 2021-04-16 ENCOUNTER — Inpatient Hospital Stay (HOSPITAL_BASED_OUTPATIENT_CLINIC_OR_DEPARTMENT_OTHER)
Admission: EM | Admit: 2021-04-16 | Discharge: 2021-04-21 | DRG: 871 | Disposition: A | Discharge status: Home / Self Care | Payer: 59 | Attending: Internal Medicine | Admitting: Internal Medicine

## 2021-04-16 ENCOUNTER — Other Ambulatory Visit: Payer: Self-pay

## 2021-04-16 ENCOUNTER — Emergency Department (HOSPITAL_BASED_OUTPATIENT_CLINIC_OR_DEPARTMENT_OTHER): Payer: 59 | Admitting: Radiology

## 2021-04-16 ENCOUNTER — Encounter (HOSPITAL_BASED_OUTPATIENT_CLINIC_OR_DEPARTMENT_OTHER): Payer: Self-pay | Admitting: Emergency Medicine

## 2021-04-16 DIAGNOSIS — N390 Urinary tract infection, site not specified: Secondary | ICD-10-CM

## 2021-04-16 DIAGNOSIS — G629 Polyneuropathy, unspecified: Secondary | ICD-10-CM | POA: Diagnosis present

## 2021-04-16 DIAGNOSIS — R0602 Shortness of breath: Secondary | ICD-10-CM

## 2021-04-16 DIAGNOSIS — I5082 Biventricular heart failure: Secondary | ICD-10-CM | POA: Diagnosis present

## 2021-04-16 DIAGNOSIS — Z7901 Long term (current) use of anticoagulants: Secondary | ICD-10-CM | POA: Diagnosis not present

## 2021-04-16 DIAGNOSIS — A4101 Sepsis due to Methicillin susceptible Staphylococcus aureus: Principal | ICD-10-CM | POA: Diagnosis present

## 2021-04-16 DIAGNOSIS — F418 Other specified anxiety disorders: Secondary | ICD-10-CM | POA: Diagnosis not present

## 2021-04-16 DIAGNOSIS — I5021 Acute systolic (congestive) heart failure: Secondary | ICD-10-CM | POA: Insufficient documentation

## 2021-04-16 DIAGNOSIS — A419 Sepsis, unspecified organism: Secondary | ICD-10-CM

## 2021-04-16 DIAGNOSIS — F411 Generalized anxiety disorder: Secondary | ICD-10-CM | POA: Diagnosis present

## 2021-04-16 DIAGNOSIS — I110 Hypertensive heart disease with heart failure: Secondary | ICD-10-CM | POA: Diagnosis present

## 2021-04-16 DIAGNOSIS — M545 Low back pain, unspecified: Secondary | ICD-10-CM | POA: Diagnosis present

## 2021-04-16 DIAGNOSIS — I509 Heart failure, unspecified: Secondary | ICD-10-CM

## 2021-04-16 DIAGNOSIS — I4819 Other persistent atrial fibrillation: Secondary | ICD-10-CM

## 2021-04-16 DIAGNOSIS — F32A Depression, unspecified: Secondary | ICD-10-CM | POA: Diagnosis present

## 2021-04-16 DIAGNOSIS — G4733 Obstructive sleep apnea (adult) (pediatric): Secondary | ICD-10-CM | POA: Diagnosis present

## 2021-04-16 DIAGNOSIS — G8929 Other chronic pain: Secondary | ICD-10-CM | POA: Diagnosis present

## 2021-04-16 DIAGNOSIS — Z79899 Other long term (current) drug therapy: Secondary | ICD-10-CM

## 2021-04-16 DIAGNOSIS — I10 Essential (primary) hypertension: Secondary | ICD-10-CM | POA: Diagnosis not present

## 2021-04-16 DIAGNOSIS — Z825 Family history of asthma and other chronic lower respiratory diseases: Secondary | ICD-10-CM

## 2021-04-16 DIAGNOSIS — Z6841 Body Mass Index (BMI) 40.0 and over, adult: Secondary | ICD-10-CM

## 2021-04-16 DIAGNOSIS — E611 Iron deficiency: Secondary | ICD-10-CM | POA: Diagnosis present

## 2021-04-16 DIAGNOSIS — I50813 Acute on chronic right heart failure: Secondary | ICD-10-CM | POA: Diagnosis not present

## 2021-04-16 DIAGNOSIS — I5033 Acute on chronic diastolic (congestive) heart failure: Secondary | ICD-10-CM | POA: Diagnosis present

## 2021-04-16 DIAGNOSIS — G2581 Restless legs syndrome: Secondary | ICD-10-CM

## 2021-04-16 DIAGNOSIS — R7303 Prediabetes: Secondary | ICD-10-CM | POA: Diagnosis present

## 2021-04-16 DIAGNOSIS — Z803 Family history of malignant neoplasm of breast: Secondary | ICD-10-CM

## 2021-04-16 DIAGNOSIS — Z23 Encounter for immunization: Secondary | ICD-10-CM

## 2021-04-16 DIAGNOSIS — E785 Hyperlipidemia, unspecified: Secondary | ICD-10-CM | POA: Diagnosis present

## 2021-04-16 DIAGNOSIS — I081 Rheumatic disorders of both mitral and tricuspid valves: Secondary | ICD-10-CM | POA: Diagnosis present

## 2021-04-16 DIAGNOSIS — Z8249 Family history of ischemic heart disease and other diseases of the circulatory system: Secondary | ICD-10-CM

## 2021-04-16 DIAGNOSIS — I4821 Permanent atrial fibrillation: Secondary | ICD-10-CM | POA: Diagnosis present

## 2021-04-16 DIAGNOSIS — Z20822 Contact with and (suspected) exposure to covid-19: Secondary | ICD-10-CM | POA: Diagnosis present

## 2021-04-16 DIAGNOSIS — Z807 Family history of other malignant neoplasms of lymphoid, hematopoietic and related tissues: Secondary | ICD-10-CM

## 2021-04-16 DIAGNOSIS — K219 Gastro-esophageal reflux disease without esophagitis: Secondary | ICD-10-CM | POA: Diagnosis present

## 2021-04-16 DIAGNOSIS — I428 Other cardiomyopathies: Secondary | ICD-10-CM | POA: Diagnosis present

## 2021-04-16 DIAGNOSIS — Z885 Allergy status to narcotic agent status: Secondary | ICD-10-CM

## 2021-04-16 DIAGNOSIS — Z8042 Family history of malignant neoplasm of prostate: Secondary | ICD-10-CM

## 2021-04-16 DIAGNOSIS — Z515 Encounter for palliative care: Secondary | ICD-10-CM | POA: Diagnosis not present

## 2021-04-16 DIAGNOSIS — Z7189 Other specified counseling: Secondary | ICD-10-CM | POA: Diagnosis not present

## 2021-04-16 DIAGNOSIS — R0603 Acute respiratory distress: Secondary | ICD-10-CM

## 2021-04-16 LAB — BRAIN NATRIURETIC PEPTIDE: B Natriuretic Peptide: 334.4 pg/mL — ABNORMAL HIGH (ref 0.0–100.0)

## 2021-04-16 LAB — URINALYSIS, ROUTINE W REFLEX MICROSCOPIC
Bilirubin Urine: NEGATIVE
Glucose, UA: NEGATIVE mg/dL
Ketones, ur: NEGATIVE mg/dL
Nitrite: NEGATIVE
Protein, ur: 100 mg/dL — AB
Specific Gravity, Urine: 1.008 (ref 1.005–1.030)
WBC, UA: >50 WBC/hpf — ABNORMAL HIGH (ref 0–5)
pH: 6 (ref 5.0–8.0)

## 2021-04-16 LAB — TSH: TSH: 1.349 u[IU]/mL (ref 0.350–4.500)

## 2021-04-16 LAB — BASIC METABOLIC PANEL
Anion gap: 9 (ref 5–15)
BUN: 19 mg/dL (ref 8–23)
CO2: 23 mmol/L (ref 22–32)
Calcium: 9.1 mg/dL (ref 8.9–10.3)
Chloride: 108 mmol/L (ref 98–111)
Creatinine, Ser: 0.94 mg/dL (ref 0.61–1.24)
GFR, Estimated: >60 mL/min (ref >60)
Glucose, Bld: 110 mg/dL — ABNORMAL HIGH (ref 70–99)
Potassium: 4.5 mmol/L (ref 3.5–5.1)
Sodium: 140 mmol/L (ref 135–145)

## 2021-04-16 LAB — CBC WITH DIFFERENTIAL/PLATELET
Abs Immature Granulocytes: 0.05 10*3/uL (ref 0.00–0.07)
Basophils Absolute: 0 10*3/uL (ref 0.0–0.1)
Basophils Relative: 0 %
Eosinophils Absolute: 0.1 10*3/uL (ref 0.0–0.5)
Eosinophils Relative: 1 %
HCT: 37.8 % — ABNORMAL LOW (ref 39.0–52.0)
Hemoglobin: 12.4 g/dL — ABNORMAL LOW (ref 13.0–17.0)
Immature Granulocytes: 1 %
Lymphocytes Relative: 10 %
Lymphs Abs: 1 10*3/uL (ref 0.7–4.0)
MCH: 27.9 pg (ref 26.0–34.0)
MCHC: 32.8 g/dL (ref 30.0–36.0)
MCV: 85.1 fL (ref 80.0–100.0)
Monocytes Absolute: 1.3 10*3/uL — ABNORMAL HIGH (ref 0.1–1.0)
Monocytes Relative: 13 %
Neutro Abs: 7.4 10*3/uL (ref 1.7–7.7)
Neutrophils Relative %: 75 %
Platelets: 251 10*3/uL (ref 150–400)
RBC: 4.44 MIL/uL (ref 4.22–5.81)
RDW: 16.1 % — ABNORMAL HIGH (ref 11.5–15.5)
WBC: 9.8 10*3/uL (ref 4.0–10.5)
nRBC: 0 % (ref 0.0–0.2)

## 2021-04-16 LAB — PHOSPHORUS: Phosphorus: 3.3 mg/dL (ref 2.5–4.6)

## 2021-04-16 LAB — RESP PANEL BY RT-PCR (FLU A&B, COVID) ARPGX2
Influenza A by PCR: NEGATIVE
Influenza B by PCR: NEGATIVE
SARS Coronavirus 2 by RT PCR: NEGATIVE

## 2021-04-16 LAB — MAGNESIUM: Magnesium: 2.1 mg/dL (ref 1.7–2.4)

## 2021-04-16 MED ORDER — ACETAMINOPHEN 325 MG PO TABS
650.0000 mg | ORAL_TABLET | Freq: Four times a day (QID) | ORAL | Status: DC | PRN
  Administered 2021-04-16 – 2021-04-20 (×6): 650 mg via ORAL
  Filled 2021-04-16 (×6): qty 2

## 2021-04-16 MED ORDER — FUROSEMIDE 10 MG/ML IJ SOLN
80.0000 mg | Freq: Once | INTRAMUSCULAR | Status: AC
  Administered 2021-04-16: 80 mg via INTRAVENOUS
  Filled 2021-04-16: qty 8

## 2021-04-16 MED ORDER — HYDRALAZINE HCL 20 MG/ML IJ SOLN
10.0000 mg | INTRAMUSCULAR | Status: DC | PRN
  Administered 2021-04-17 (×2): 10 mg via INTRAVENOUS
  Filled 2021-04-16 (×2): qty 1

## 2021-04-16 MED ORDER — INFLUENZA VAC SPLIT QUAD 0.5 ML IM SUSY
0.5000 mL | PREFILLED_SYRINGE | INTRAMUSCULAR | Status: AC
  Administered 2021-04-21: 0.5 mL via INTRAMUSCULAR
  Filled 2021-04-16: qty 0.5

## 2021-04-16 MED ORDER — NITROGLYCERIN 0.4 MG SL SUBL
0.4000 mg | SUBLINGUAL_TABLET | Freq: Once | SUBLINGUAL | Status: AC
  Administered 2021-04-16: 0.4 mg via SUBLINGUAL
  Filled 2021-04-16: qty 1

## 2021-04-16 MED ORDER — LORAZEPAM 2 MG/ML IJ SOLN
0.5000 mg | Freq: Once | INTRAMUSCULAR | Status: AC
  Administered 2021-04-16: 0.5 mg via INTRAVENOUS
  Filled 2021-04-16: qty 1

## 2021-04-16 MED ORDER — LORAZEPAM 2 MG/ML IJ SOLN
1.0000 mg | Freq: Once | INTRAMUSCULAR | Status: AC
  Administered 2021-04-16: 1 mg via INTRAVENOUS
  Filled 2021-04-16: qty 1

## 2021-04-16 MED ORDER — DIPHENHYDRAMINE HCL 50 MG/ML IJ SOLN
12.5000 mg | Freq: Once | INTRAMUSCULAR | Status: AC
  Administered 2021-04-16: 12.5 mg via INTRAVENOUS
  Filled 2021-04-16: qty 1

## 2021-04-16 MED ORDER — CARVEDILOL 25 MG PO TABS
25.0000 mg | ORAL_TABLET | Freq: Two times a day (BID) | ORAL | Status: DC
  Administered 2021-04-16 – 2021-04-21 (×10): 25 mg via ORAL
  Filled 2021-04-16 (×10): qty 1

## 2021-04-16 MED ORDER — LORAZEPAM 2 MG/ML IJ SOLN
0.5000 mg | INTRAMUSCULAR | Status: DC | PRN
  Administered 2021-04-17 – 2021-04-19 (×5): 0.5 mg via INTRAVENOUS
  Filled 2021-04-16 (×5): qty 1

## 2021-04-16 MED ORDER — ACETAMINOPHEN 650 MG RE SUPP: 650.0000 mg | Freq: Four times a day (QID) | RECTAL | Status: DC | PRN

## 2021-04-16 MED ORDER — IPRATROPIUM-ALBUTEROL 0.5-2.5 (3) MG/3ML IN SOLN
3.0000 mL | RESPIRATORY_TRACT | Status: DC | PRN
  Administered 2021-04-16: 3 mL via RESPIRATORY_TRACT
  Filled 2021-04-16: qty 3

## 2021-04-16 MED ORDER — SODIUM CHLORIDE 0.9 % IV SOLN
1.0000 g | INTRAVENOUS | Status: DC
  Administered 2021-04-17 – 2021-04-19 (×3): 1 g via INTRAVENOUS
  Filled 2021-04-16 (×3): qty 10

## 2021-04-16 MED ORDER — FUROSEMIDE 10 MG/ML IJ SOLN
40.0000 mg | Freq: Once | INTRAMUSCULAR | Status: AC
  Administered 2021-04-16: 40 mg via INTRAVENOUS
  Filled 2021-04-16: qty 4

## 2021-04-16 NOTE — H&P (Signed)
History and Physical    PLEASE NOTE THAT DRAGON DICTATION SOFTWARE WAS USED IN THE CONSTRUCTION OF THIS NOTE.  [name] VOZ:[id number] DOB: 1955/10/21 DOA: 04/16/2021  PCP: [name], MD Patient coming from: home   I have personally briefly reviewed patient's old medical records in [place]  Chief Complaint: Shortness of breath  HPI: [name] is a 65 y.o. male with medical history significant for chronic diastolic heart failure, persistent atrial fibrillation chronically anticoagulated on Eliquis hypertension, generalized anxiety disorder, who is admitted to [facility] on 04/16/2021 by way of transfer from [facility] ED with acute on chronic diastolic heart failure after presenting from home to the latter facility complaining of shortness of breath.   Patient reports that while he has been experiencing mild shortness of breath over the last few months, he notes significant worsening of his shortness of breath over the course of the last week, however which time he reports further worsening of the swelling in his bilateral lower extremities as well as interval development of wheezing.  Over that timeframe, he also notes worsening orthopnea, PND.  While he is unsure as to his specific weight gain over the course the last week, he believes that his weight has increased over that timeframe, and notes an estimated 50 to 60 pound weight gain over the course of 2022.  He denies any associated chest pain, palpitations, diaphoresis, dizziness, presyncope, or syncope.  Worsening bilateral lower extremity edema has not been associate with any new calf tenderness or lower extremity erythema.  Not associated with any cough, hemoptysis. Denies any recent trauma, travel, surgical procedures, or periods of prolonged diminished ambulatory status. No recent melena or hematochezia.  Denies any recent nausea, vomiting, diarrhea, abdominal pain, or rash.  No recent known COVID-19  exposures.  He has a documented history of chronic diastolic heart failure, with most recent echocardiogram in September 2022 notable for LVEF 50 to 60%, no evidence of focal wall motion normalities, mild concentric LVH, indeterminate diastolic parameters, right ventricle mildly enlarged with normal systolic function, mildly dilated left atrium, moderate mitral regurgitation.  He follows with Dr. Stanford Breed as his outpatient cardiologist, and per most recent follow-up 2 weeks ago, was instructed to increase his dose of torsemide from 20 mg p.o. daily to 20 mg p.o. twice daily.  Patient reports that he is compliantly made this adjustment, and has noted an increase in his urine output, but no appreciable improvement in his shortness of breath with this measure.  He reports good compliance with his outpatient torsemide, and denies any recent missed doses thereof.   Also has history of persistent atrial fibrillation, chronically anticoagulated on Eliquis, with good compliance with this medication.  He underwent ablation in 2007 at [facility], with subsequent recurrence of atrial fibrillation.  Per chart review, it appears that he is undergone 3 ensuing electrocardioversion's, with ensuing return of atrial fibrillation.  Per the patient, he has never undergone pharmacologic rhythm control on a regular basis as an outpatient.  He confirms good compliance with home Coreg.  Of note, patient confirms that he is no longer on olmesartan, and is unsure why this medication was discontinued.  He does not recall the specific timeframe in which it was stopped.      [facility] ED Course:  Vital signs in the ED were notable for the following: Tetramex 90.3, heart rate 89-1 12; blood pressure 158/94  187/118 ; respiratory rate 20-33, oxygen saturation 94 to 96% on room air, with  ensuing initiation of supplemental oxygen for patient comfort, upon which is considering oxygen saturations were noted to be 97 to 100% on 4 L nasal  cannula, without any documented hypoxia.  Labs were notable for the following: BMP notable for the following: Sodium 140, potassium 4.5, bicarbonate 23, creatinine 0.94.  BNP 334 compared to most recent prior value of 36 in May 2020.  TSH 1.349.  CBC notable for platelets of 9800, hemoglobin 12.4.  Urinalysis was associated with a cloudy specimen and demonstrated greater than 50 white blood cells, large leukocyte Estrace.  COVID-19/influenza PCR were checked at [facility] ED today and found to be negative.  Imaging and additional notable ED work-up: EKG shows atrial fibrillation with ventricular rate 72, and no evidence of T wave or ST changes, including no evidence of ST elevation.  Chest x-ray showed no evidence of acute cardiopulmonary process.  While in the ED, the following were administered: Lasix 80 mg IV x1, Ativan 0.5 mg IV x1, Ativan 1 mg IV x1, duo nebulizer treatment x1.  Separately, the patient was transferred to [facility] for further evaluation and management of acute on chronic diastolic heart failure.  Of note, upon arrival at [facility], patient has spiked interval fever, with temperature upon arrival noted to be 100.9.      Review of Systems: As per HPI otherwise 10 point review of systems negative.   Past Medical History:  Diagnosis Date   Allergic rhinitis    Anxiety    Asthma    as a child   Chronic low back pain    Complication of anesthesia    "hard to put asleep, hard to wake up, nausea and vomiting"   Depression    ED (erectile dysfunction)    GERD (gastroesophageal reflux disease)    HLD (hyperlipidemia)    HTN (hypertension)    sees Dr. [name], [facility]   Hypertrophy of prostate with urinary obstruction and other lower urinary tract symptoms (LUTS)    IBS (irritable bowel syndrome)    Inguinal hernia without mention of obstruction or gangrene, unilateral or unspecified, (not specified as recurrent)    Insomnia    Lumbar herniated disc     L7   Morbid obesity ([place])    Narcotic addiction ([place])    NICM (nonischemic cardiomyopathy) ([place])    tachycardia mediated,  resolved with sinus   Persistent atrial fibrillation ([place])    ablation done 03/2006 at [facility]   Pneumonia    hx of 2004   PONV (postoperative nausea and vomiting)    Twitching    legs    Past Surgical History:  Procedure Laterality Date   ATRIAL ABLATION SURGERY  2007   [facility]   CARDIOVERSION  08/11/2011   Procedure: CARDIOVERSION;  Surgeon: Lelon Perla, MD;  Location: [place];  Service: Cardiovascular;  Laterality: N/A;   CARDIOVERSION N/A 04/16/2017   Procedure: CARDIOVERSION;  Surgeon: Sanda Klein, MD;  Location: [facility] ENDOSCOPY;  Service: Cardiovascular;  Laterality: N/A;   CARDIOVERSION N/A 03/04/2019   Procedure: CARDIOVERSION;  Surgeon: Lelon Perla, MD;  Location: [facility] ENDOSCOPY;  Service: Cardiovascular;  Laterality: N/A;   CARDIOVERSION N/A 07/04/2019   Procedure: CARDIOVERSION;  Surgeon: [name], MD;  Location: [facility] ENDOSCOPY;  Service: Cardiovascular;  Laterality: N/A;   EYE SURGERY     lasik, [place] Right 08/26/2012   Procedure: LAPAROSCOPIC INGUINAL HERNIA;  Surgeon: Madilyn Hook,  DO;  Location: [place];  Service: General;  Laterality: Right;  laparoscopic right inguinal hernia repair with mesh, umbilical hernia repair   INSERTION OF MESH Right 08/26/2012   Procedure: INSERTION OF MESH;  Surgeon: Madilyn Hook, DO;  Location: [place];  Service: General;  Laterality: Right;  right inguinal hernia   TEE WITHOUT CARDIOVERSION N/A 03/04/2019   Procedure: TRANSESOPHAGEAL ECHOCARDIOGRAM (TEE);  Surgeon: Lelon Perla, MD;  Location: [place];  Service: Cardiovascular;  Laterality: N/A;   UMBILICAL HERNIA REPAIR N/A 08/26/2012   Procedure: HERNIA REPAIR UMBILICAL ADULT;  Surgeon: Madilyn Hook, DO;  Location: [place];  Service: General;  Laterality: N/A;    Social History:  reports that he has  never smoked. He has never used smokeless tobacco. He reports that he does not drink alcohol and does not use drugs.   Allergies  Allergen Reactions   Hydrocodone Other (See Comments)    GI upset, dizziness    Family History  Problem Relation Age of Onset   Asthma Mother    Breast cancer Mother    Hypertension Mother    COPD Father    Prostate cancer Father    Hypertension Father    Lymphoma Sister     Family history reviewed and not pertinent    Prior to Admission medications   Medication Sig Start Date End Date Taking? Authorizing Provider  carvedilol (COREG) 25 MG tablet Take 1 tablet (25 mg total) by mouth 2 (two) times daily. 12/11/20  Yes Lelon Perla, MD  diltiazem (CARDIZEM CD) 360 MG 24 hr capsule Take 1 capsule (360 mg total) by mouth daily. 08/09/20  Yes Lelon Perla, MD  DULoxetine (CYMBALTA) 60 MG capsule SMARTSIG:1 Capsule(s) By Mouth Every Evening 02/26/21  Yes [provider]  ELIQUIS 5 MG TABS tablet TAKE 1 TABLET(5 MG) BY MOUTH TWICE DAILY 08/07/20  Yes Crenshaw, Denice Bors, MD  potassium chloride SA (KLOR-CON) 20 MEQ tablet Take 1 tablet (20 mEq total) by mouth daily. 01/17/21  Yes Lelon Perla, MD  rOPINIRole (REQUIP) 1 MG tablet Take 1-2 mg by mouth at bedtime as needed (restless leg syndrome). 1 tablet after lunch and 2 tablet at bedtime 02/24/19  Yes [provider]  torsemide (DEMADEX) 20 MG tablet Take 1 tablet (20 mg total) by mouth daily. 01/17/21 04/17/21 Yes Crenshaw, Denice Bors, MD  ALPRAZolam Duanne Moron) 0.5 MG tablet TAKE 1 TABLET(0.5 MG) BY MOUTH AT BEDTIME AS NEEDED FOR ANXIETY OR SLEEP Patient not taking: No sig reported 02/07/20   Marcial Pacas, MD  montelukast (SINGULAIR) 10 MG tablet Take 10 mg by mouth daily as needed (sob).  01/05/20   [provider]  olmesartan (BENICAR) 40 MG tablet Take 1 tablet (40 mg total) by mouth daily. Patient not taking: No sig reported 12/11/20   Lelon Perla, MD  pregabalin (LYRICA) 100 MG  capsule Take 1 capsule (100 mg total) by mouth 3 (three) times daily. Patient not taking: No sig reported 01/16/20   Marcial Pacas, MD  tadalafil (CIALIS) 20 MG tablet Take 20 mg by mouth daily as needed. 08/31/20   [provider]     Objective    Physical Exam: Vitals:   04/16/21 2015 04/16/21 2100 04/16/21 2210 04/16/21 2315  BP: (!) 146/96 (!) 161/105 (!) 187/94 (!) 186/138  Pulse: (!) 105 (!) 110 (!) 101 (!) 117  Resp: (!) 26 (!) 26 (!) 21 (!) 24  Temp:    (!) 100.9 F (38.3 C)  TempSrc:    Oral  SpO2: 95% 96% 94% 100%  Weight:      Height:        General: appears to be stated age; alert, oriented; mildly increased work of breathing noted Skin: warm, dry, no rash Head:  AT/[place] Mouth:  Oral mucosa membranes appear moist, normal dentition Neck: supple; trachea midline Heart: Irregular; did not appreciate any M/R/G Lungs: Bibasilar crackles and expiratory wheezes noted, in the absence of any rhonchi.  Abdomen: + BS; soft, ND, NT Vascular: 2+ pedal pulses b/l; 2+ radial pulses b/l Extremities: 2+ edema in the bilateral lower extremities, no muscle wasting Neuro: strength and sensation intact in upper and lower extremities b/l    Labs on Admission: I have personally reviewed following labs and imaging studies  CBC: Recent Labs  Lab 04/16/21 1607  WBC 9.8  NEUTROABS 7.4  HGB 12.4*  HCT 37.8*  MCV 85.1  PLT 497   Basic Metabolic Panel: Recent Labs  Lab 04/16/21 1607  NA 140  K 4.5  CL 108  CO2 23  GLUCOSE 110*  BUN 19  CREATININE 0.94  CALCIUM 9.1  MG 2.1  PHOS 3.3   GFR: Estimated Creatinine Clearance: 124.7 mL/min (by C-G formula based on SCr of 0.94 mg/dL). Liver Function Tests: No results for input(s): AST, ALT, ALKPHOS, BILITOT, PROT, ALBUMIN in the last 168 hours. No results for input(s): LIPASE, AMYLASE in the last 168 hours. No results for input(s): AMMONIA in the last 168 hours. Coagulation Profile: No results for input(s): INR, PROTIME  in the last 168 hours. Cardiac Enzymes: No results for input(s): CKTOTAL, CKMB, CKMBINDEX, TROPONINI in the last 168 hours. BNP (last 3 results) No results for input(s): PROBNP in the last 8760 hours. HbA1C: No results for input(s): HGBA1C in the last 72 hours. CBG: No results for input(s): GLUCAP in the last 168 hours. Lipid Profile: No results for input(s): CHOL, HDL, LDLCALC, TRIG, CHOLHDL, LDLDIRECT in the last 72 hours. Thyroid Function Tests: Recent Labs    04/16/21 1607  TSH 1.349   Anemia Panel: No results for input(s): VITAMINB12, FOLATE, FERRITIN, TIBC, IRON, RETICCTPCT in the last 72 hours. Urine analysis:    Component Value Date/Time   COLORURINE YELLOW 04/16/2021 1830   APPEARANCEUR CLOUDY (A) 04/16/2021 1830   LABSPEC 1.008 04/16/2021 1830   PHURINE 6.0 04/16/2021 1830   GLUCOSEU NEGATIVE 04/16/2021 1830   HGBUR MODERATE (A) 04/16/2021 1830   BILIRUBINUR NEGATIVE 04/16/2021 [place] 04/16/2021 1830   PROTEINUR 100 (A) 04/16/2021 1830   UROBILINOGEN 0.2 01/13/2013 1246   NITRITE NEGATIVE 04/16/2021 1830   [place] (A) 04/16/2021 1830    Radiological Exams on Admission: DG Chest 2 View  Result Date: 04/16/2021 CLINICAL DATA:  Shortness of breath EXAM: CHEST - 2 VIEW COMPARISON:  Chest x-ray 11/17/2018, CT chest 11/17/2018, chest x-ray 07/15/2004 FINDINGS: The heart and mediastinal contours are unchanged. Biapical pleural/pulmonary scarring. No focal consolidation. No pulmonary edema. No pleural effusion. No pneumothorax. No acute osseous abnormality. IMPRESSION: No active cardiopulmonary disease. Electronically Signed   By: Iven Finn M.D.   On: 04/16/2021 15:19     EKG: Independently reviewed, with result as described above.    Assessment/Plan    Principal Problem:   Acute on chronic diastolic CHF (congestive heart failure) (HCC) Active Problems:   Anxiety with depression   Essential hypertension   Persistent atrial  fibrillation (HCC)   SOB (shortness of breath)   Sepsis ([place])   Acute  lower UTI     #) Acute on chronic diastolic heart failure: dx of acute decompensation on the basis of presenting progressive shortness of breath associated with worsening edema in the bilateral lower extremities as well as orthopnea, PND, development of wheezing and weight gain, as quantified above, with presenting BNP noted to be elevated 10 times that of most recent prior value, with evidence of crackles on physical exam. This is in the context of a known history of chronic diastolic heart failure, with most recent echocardiogram performed in September 2022, as further detailed above.   Patient conveys good compliance with home diuretic therapy, but notes no significant improvement in his breathing following recent increase in torsemide to 20 mg p.o. twice daily, as further detailed above.  Overall, ACS leading to presenting acutely decompensated heart failure appears less likely at this time in the absence of any recent CP, presenting EKG showing rate controlled atrial fibrillation without evidence of acute ischemic changes.  Suspect some contribution from the patient's shortness of breath stemming from his presenting atrial fibrillation, which he reports he does not tolerate well from a shortness of breath standpoint.  Atrial fibrillation appears rate controlled at this time.  Additionally, there appears to be room for optimization and afterload reduction to promote enhancement of cardiac output.  Prior ablation as well as at least 3 electrocardioversion's, with ensuing persistent atrial fibrillation.  aside from his outpatient diuretic, it appears that his additional outpatient hypertensive medications include Coreg as well as diltiazem.  Unclear why olmesartan was discontinued.   Of note, patient received Lasix 80 mg IV x1 while in the ED today.  Particularly given no improvement in his respiratory status following outpatient  increase in diuretic dosing, admission for IV diuresis warranted, with close monitoring of ensuing renal function, electrolytes, and volume status, as further noted below. Will also closely monitor ensuing HR as an additional means to evaluate volume status and help guide subsequent diuresis decision-making. As patient is already on a BB at home, will plan to continue this. Of note, I utilized the Heart Failure order set to assist with my placement of orders on this patient.   In terms of dosing of his subsequent IV Lasix, his outpatient torsemide 20 mg p.o. daily as equivalent to Lasix 40 IV twice daily.  Will initiate IV Lasix regimen representing a relative increase in effective daily dosing, as quantified below.   Suspect that his recent illness to have expiratory wheezing is on the basis of cardiac wheeze from acute on chronic diastolic heart failure.  However, the patient does note a childhood history of asthma.  We will provide a dose of solumedrol [place] component of reactive airway disease that may be a secondary consequence of physiologic stress from acute on chronic diastolic heart failure.    Plan: monitor strict I's & O's and daily weights. Monitor on telemetry, including trend in HR in response to diuresis. Monitor continuous pulse oximetry. Repeat BMP in the morning, including for monitoring trend of potassium, bicarbonate, and renal function in response to interval diuresis efforts. Check serum magnesium level. Close monitoring of ensuing blood pressure response to diuresis efforts, including to help guide need for improvement in afterload reduction in order to optimize cardiac output. Lasix 60 mg IV twice daily. Continue outpatient BB.  Additional chart review to determine rationale behind discontinuation of olmesartan while attempting to further optimize blood pressure for afterload reduction, as detailed below.   Continue on potassium supplementation.       #)  Essential hypertension:  Documented history of such, with systolic blood pressures noted to be in the 150s to 180s, representing potential room for optimization for afterload reduction, which may benefit him from an acute on chronic diastolic heart failure standpoint.  Outpatient and hypertensive medications, as above, with unclear rationale for previous discontinuation of olmesartan, as above.  Will resume home antihypertensive medications for now, and enhance IV diuresis efforts, as above, and engage in further interview for rationale of formal circumvents discontinuation in effort to optimize oral antihypertensive regimen.  Considered initiating nitroglycerin paste, however blood pressure appears to be demonstrating some improvement with additional diuresis and resumption of home Coreg.   Plan: Continue home Coreg, diltiazem, IV Lasix, as above.  Monitor strict I's and O's and daily weights.  Chart review evaluation of recent losartan discontinuation.  Check urinary drug screen.  As needed IV hydralazine.       #) Sepsis due to UTI: In the setting of presenting objective fever, as well as urinalysis demonstrating significant pyuria associate with a cloudy specimen and large leukocyte Estrace, without any squamous epithelial cells to suggest contamination, treat this clinical picture as consistent with urinary tract infection, in spite of no reported dysuria, particularly in setting of no evidence of additional underlying infectious process, including no evidence of infiltrate on chest x-ray, while COVID/influenza PCR found to be negative.  In addition to objective fever, SIRS criteria met via presenting tachypnea/tachycardia.  Will check blood cultures x2 followed by empiric initiation of Rocephin.  Also check stat lactic acid.  Plan: Blood cultures x2 followed by 7.  Stat lactic acid.  Repeat CBC with differential in the morning.  Add on procalcitonin.  Urine culture as needed acetaminophen for fever.       #)  Persistent atrial fibrillation: Documented history of such. In the setting of a CHA2DS2-VASc score of 2, there is an indication for the patient to be on chronic anticoagulation for thromboembolic prophylaxis. Consistent with this, the patient is chronically anticoagulated on Eliquis, with good reported compliance. Home AV nodal blocking regimen: Diltiazem and Coreg.  Most recent echocardiogram in September 2022, with results as detailed above. Presenting EKG indicates rate controlled atrial fibrillation.  It appears that he has persistent atrial fibrillation and spite of prior ablation procedure in 2007 at [facility] as well as a least 3 interval electrocardioversion attempts, as further detailed above.  Denies any prior history of pharmacologic rhythm control strategy as an outpatient.  Follows with Dr. Stanford Breed as outpatient cardiologist.   Plan: monitor strict I's & O's and daily weights. Repeat BMP and CBC in the morning. Check serum magnesium level. Continue home AV nodal blocking regimen, as above.  Continue home Eliquis.  Further evaluation management of presenting acute on chronic diastolic heart failure.  Monitor strict I's and O's and daily weights.      #) Generalized anxiety disorder: On Cymbalta as well as as needed Xanax as an outpatient.  There appears that there may be element of anxiety further contributing to the patient's presenting shortness of breath stemming from primary acute on chronic diastolic heart failure, as above.   Plan: Continue home Cymbalta.  As needed IV Ativan for anxiety.     DVT prophylaxis: Continue home Eliquis Code Status: Full code Family Communication: none Disposition Plan: Per Rounding Team Consults called: none;  Admission status: Inpatient; med telemetry   Of note, this patient was added by me to the following Admit List/Treatment Team:  mcadmits.   Of note, the  Adult Admission Order Set (Multimorbid order set) was used by me in the admission process  for this patient.  PLEASE NOTE THAT DRAGON DICTATION SOFTWARE WAS USED IN THE CONSTRUCTION OF THIS NOTE.   Rhetta Mura DO [facility] Pager [phone] From [place]   04/16/2021, 11:26 PM

## 2021-04-16 NOTE — Telephone Encounter (Signed)
Left message for pt to call.

## 2021-04-16 NOTE — Progress Notes (Signed)
Patient reassessed by RT. Ask physician if we should place patient on BIPAP due to increased WOB but the Physician stated he was giving more Lasix and Ativan due to patient being anxious. Patient appears to be very anxious at this time. Patient told to breath in thru mouth and out thru nose to calm breathing.  Rt will continue to monitor patient

## 2021-04-16 NOTE — ED Triage Notes (Signed)
Pt arrives to ED with c/o of shortness of breath x6 months. He reports that his SOB worsened today. He is now experiencing dyspnea on exertion. He has noticed increased swelling is his legs and abdomen.

## 2021-04-16 NOTE — ED Notes (Signed)
Pt very anxious,  states he has a rash on foot that is itching terribly, wife putting Cortizone cream on area upon entry to room

## 2021-04-16 NOTE — ED Notes (Signed)
Pt extremely SHOB, standing at bedside, pacing, using urinal.  Orders received for additional ativan.  Explained to pt he can not get OOB  r/t to his breathing, RR increased to 44

## 2021-04-16 NOTE — ED Notes (Signed)
Patient Care Performed. Bed Sheets changed and External Urinary Catheter replaced. Warm Blankets applied and Patient comfortable at this Time. Call [place] within [place]. HOB raised to comfortable level. Staff will continue to Monitor. No Needs Expressed.

## 2021-04-16 NOTE — Telephone Encounter (Signed)
Matter addressed in another message.

## 2021-04-16 NOTE — Progress Notes (Signed)
Patient was given a Duoneb breathing treatment and tolerated well. He had audible expiratory wheezes prior to treatment now he had less wheezes thru out. He appears to have relaxed at this point. RT will continue to monitor. Vitals are currently : HR 97, RR 24, Spo2 97% on 4L [place]

## 2021-04-16 NOTE — ED Notes (Signed)
Benadryl given Iv pt is still very anxious, MD provider made aware

## 2021-04-16 NOTE — ED Notes (Signed)
Care Handoff/Report given to [facility] at this Time. All Questions answered at this time.

## 2021-04-16 NOTE — ED Notes (Signed)
Called [facility] to advise that patient has a bed ready @ [facility], 3E 06   22:05

## 2021-04-16 NOTE — ED Notes (Signed)
Report given to Casey Burkitt at [facility]

## 2021-04-16 NOTE — ED Provider Notes (Signed)
[facility] EMERGENCY DEPT Provider Note   CSN: [id number] Arrival date & time: 04/16/21  1436     History Chief Complaint  Patient presents with   Shortness of Breath    [name] is a 65 y.o. male.   Shortness of Breath Associated symptoms: no rash   Patient presents with shortness of breath.  Has been going over the last months but worse last few days.  Worse the last week in particular.  States he could not walk to the bathroom right now he needed to.  Has a history of atrial fibrillation.  Has had previous ablation but then returned.  Not a candidate for surgery.  Is on anticoagulation.  States he feels better when he is not in A. fib.  Has been seen by cardiology and had recent increase in his torsemide.  States still increased swelling.  Difficulty sleeping flat.  No chest pain.  States he has been wheezing.  States his legs are getting more itchy and he cannot sleep because his legs get crampy.  States his weight is up.  Difficult to get a determination how much it is in short-term but reportedly is up like 60 pounds over the last year.    Past Medical History:  Diagnosis Date   Allergic rhinitis    Anxiety    Asthma    as a child   Chronic low back pain    Complication of anesthesia    "hard to put asleep, hard to wake up, nausea and vomiting"   Depression    ED (erectile dysfunction)    GERD (gastroesophageal reflux disease)    HLD (hyperlipidemia)    HTN (hypertension)    sees Dr. [name], [facility]   Hypertrophy of prostate with urinary obstruction and other lower urinary tract symptoms (LUTS)    IBS (irritable bowel syndrome)    Inguinal hernia without mention of obstruction or gangrene, unilateral or unspecified, (not specified as recurrent)    Insomnia    Lumbar herniated disc    L7   Morbid obesity ([place])    Narcotic addiction ([place])    NICM (nonischemic cardiomyopathy) ([place])    tachycardia mediated,  resolved with sinus    Persistent atrial fibrillation ([place])    ablation done 03/2006 at [facility]   Pneumonia    hx of 2004   PONV (postoperative nausea and vomiting)    Twitching    legs    Patient Active Problem List   Diagnosis Date Noted   Abscess of forearm, left    Cellulitis of forearm    Abscess of left forearm 04/24/2020   Restless leg syndrome 01/16/2020   Sleep apnea 01/16/2020   Persistent atrial fibrillation ([place]) 05/02/2019   Acquired thrombophilia ([place]) 05/02/2019   Jerking movements of extremities 03/24/2019   Morbid obesity ([place]) 03/03/2019   Syncope 11/15/2015   Encounter for therapeutic drug monitoring 08/02/2013   Long term current use of anticoagulant 09/18/2010   Mitral regurgitation 03/14/2009   Dilated cardiomyopathy ([place]) 03/14/2009   Anxiety with depression 02/13/2009   ALLERGIC RHINITIS 02/13/2009   Hyperlipidemia 02/12/2009   Essential hypertension 02/12/2009    Past Surgical History:  Procedure Laterality Date   ATRIAL ABLATION SURGERY  2007   [facility]   CARDIOVERSION  08/11/2011   Procedure: CARDIOVERSION;  Surgeon: Lelon Perla, MD;  Location: [facility];  Service: Cardiovascular;  Laterality: N/A;   CARDIOVERSION N/A 04/16/2017   Procedure: CARDIOVERSION;  Surgeon: Sanda Klein, MD;  Location: [facility];  Service: Cardiovascular;  Laterality: N/A;   CARDIOVERSION N/A 03/04/2019   Procedure: CARDIOVERSION;  Surgeon: Lelon Perla, MD;  Location: [facility] ENDOSCOPY;  Service: Cardiovascular;  Laterality: N/A;   CARDIOVERSION N/A 07/04/2019   Procedure: CARDIOVERSION;  Surgeon: [name], MD;  Location: [facility];  Service: Cardiovascular;  Laterality: N/A;   EYE SURGERY     lasik, [place] Right 08/26/2012   Procedure: LAPAROSCOPIC INGUINAL HERNIA;  Surgeon: Madilyn Hook, DO;  Location: [facility];  Service: General;  Laterality: Right;  laparoscopic right inguinal hernia repair with mesh, umbilical hernia repair    INSERTION OF MESH Right 08/26/2012   Procedure: INSERTION OF MESH;  Surgeon: Madilyn Hook, DO;  Location: [facility];  Service: General;  Laterality: Right;  right inguinal hernia   TEE WITHOUT CARDIOVERSION N/A 03/04/2019   Procedure: TRANSESOPHAGEAL ECHOCARDIOGRAM (TEE);  Surgeon: Lelon Perla, MD;  Location: [facility];  Service: Cardiovascular;  Laterality: N/A;   UMBILICAL HERNIA REPAIR N/A 08/26/2012   Procedure: HERNIA REPAIR UMBILICAL ADULT;  Surgeon: Madilyn Hook, DO;  Location: [facility];  Service: General;  Laterality: N/A;       Family History  Problem Relation Age of Onset   Asthma Mother    Breast cancer Mother    Hypertension Mother    COPD Father    Prostate cancer Father    Hypertension Father    Lymphoma Sister     Social History   Tobacco Use   Smoking status: Never   Smokeless tobacco: Never  Vaping Use   Vaping Use: Never used  Substance Use Topics   Alcohol use: No    Alcohol/week: 0.0 standard drinks   Drug use: No    Home Medications Prior to Admission medications   Medication Sig Start Date End Date Taking? Authorizing Provider  carvedilol (COREG) 25 MG tablet Take 1 tablet (25 mg total) by mouth 2 (two) times daily. 12/11/20  Yes Lelon Perla, MD  diltiazem (CARDIZEM CD) 360 MG 24 hr capsule Take 1 capsule (360 mg total) by mouth daily. 08/09/20  Yes Lelon Perla, MD  DULoxetine (CYMBALTA) 60 MG capsule SMARTSIG:1 Capsule(s) By Mouth Every Evening 02/26/21  Yes [provider]  ELIQUIS 5 MG TABS tablet TAKE 1 TABLET(5 MG) BY MOUTH TWICE DAILY 08/07/20  Yes Crenshaw, Denice Bors, MD  potassium chloride SA (KLOR-CON) 20 MEQ tablet Take 1 tablet (20 mEq total) by mouth daily. 01/17/21  Yes Lelon Perla, MD  rOPINIRole (REQUIP) 1 MG tablet Take 1-2 mg by mouth at bedtime as needed (restless leg syndrome). 1 tablet after lunch and 2 tablet at bedtime 02/24/19  Yes [provider]  torsemide (DEMADEX) 20 MG tablet Take 1 tablet (20 mg  total) by mouth daily. 01/17/21 04/17/21 Yes Crenshaw, Denice Bors, MD  ALPRAZolam Duanne Moron) 0.5 MG tablet TAKE 1 TABLET(0.5 MG) BY MOUTH AT BEDTIME AS NEEDED FOR ANXIETY OR SLEEP Patient not taking: No sig reported 02/07/20   Marcial Pacas, MD  montelukast (SINGULAIR) 10 MG tablet Take 10 mg by mouth daily as needed (sob).  01/05/20   [provider]  olmesartan (BENICAR) 40 MG tablet Take 1 tablet (40 mg total) by mouth daily. Patient not taking: No sig reported 12/11/20   Lelon Perla, MD  pregabalin (LYRICA) 100 MG capsule Take 1 capsule (100 mg total) by mouth 3 (three) times daily. Patient not taking: No  sig reported 01/16/20   Marcial Pacas, MD  tadalafil (CIALIS) 20 MG tablet Take 20 mg by mouth daily as needed. 08/31/20   [provider]    Allergies    Hydrocodone  Review of Systems   Review of Systems  Constitutional:  Negative for appetite change.  HENT:  Negative for congestion.   Respiratory:  Positive for shortness of breath.   Cardiovascular:  Positive for leg swelling.  Genitourinary:  Negative for flank pain.  Musculoskeletal:  Negative for back pain.  Skin:  Negative for rash.       Wounds on bilateral lower legs.  Neurological:  Negative for numbness.  Psychiatric/Behavioral:  The patient is nervous/anxious.    Physical Exam Updated Vital Signs BP (!) 165/101 (BP Location: Right Arm)   Pulse 81   Temp 98.1 F (36.7 C)   Resp (!) 25   SpO2 95%   Physical Exam Vitals and nursing note reviewed.  HENT:     Head: Normocephalic.  Cardiovascular:     Rate and Rhythm: Rhythm irregular.  Pulmonary:     Effort: Tachypnea present.     Comments: Some wheezes and rales at the bases. Chest:     Chest wall: No tenderness.  Abdominal:     Tenderness: There is no abdominal tenderness.  Musculoskeletal:     Cervical back: Neck supple.     Right lower leg: Edema present.     Left lower leg: Edema present.     Comments: Moderate pitting edema bilateral lower  extremities.  Skin:    General: Skin is warm.     Capillary Refill: Capillary refill takes less than 2 seconds.  Neurological:     Mental Status: He is alert and oriented to person, place, and time.  Psychiatric:        Mood and Affect: Mood is anxious.    ED Results / Procedures / Treatments   Labs (all labs ordered are listed, but only abnormal results are displayed) Labs Reviewed  BASIC METABOLIC PANEL  CBC WITH DIFFERENTIAL/PLATELET  BRAIN NATRIURETIC PEPTIDE  TSH  MAGNESIUM  PHOSPHORUS    EKG EKG Interpretation  Date/Time:  Tuesday April 16 2021 14:53:25 EDT Ventricular Rate:  76 PR Interval:    QRS Duration: 102 QT Interval:  406 QTC Calculation: 456 R Axis:   58 Text Interpretation: Atrial fibrillation with premature ventricular or aberrantly conducted complexes Incomplete right bundle branch block Abnormal ECG Confirmed by Davonna Belling [phone]) on 04/16/2021 3:34:34 PM  Radiology DG Chest 2 View  Result Date: 04/16/2021 CLINICAL DATA:  Shortness of breath EXAM: CHEST - 2 VIEW COMPARISON:  Chest x-ray 11/17/2018, CT chest 11/17/2018, chest x-ray 07/15/2004 FINDINGS: The heart and mediastinal contours are unchanged. Biapical pleural/pulmonary scarring. No focal consolidation. No pulmonary edema. No pleural effusion. No pneumothorax. No acute osseous abnormality. IMPRESSION: No active cardiopulmonary disease. Electronically Signed   By: Iven Finn M.D.   On: 04/16/2021 15:19    Procedures Procedures   Medications Ordered in ED Medications  diphenhydrAMINE (BENADRYL) injection 12.5 mg (has no administration in time range)    ED Course  I have reviewed the triage vital signs and the nursing notes.  Pertinent labs & imaging results that were available during my care of the patient were reviewed by me and considered in my medical decision making (see chart for details).    MDM Rules/Calculators/A&P  Patient presents with  worsening shortness of breath.  History of diastolic heart failure.  Has been on increasing doses of torsemide at home.  Still worsening symptoms.  Decreased ability to exercise.  States he gets more short of breath.  Has edema on legs.  BNP elevated.  X-ray reassuring but does have some rales on exam.  Also has some potential wheezing.  I think more likely cardiac but also may be an anxiety component.  Is on anticoagulation for A. fib.  Given IV Lasix.  Will require admission to the hospital.  Will discuss with hospitalist. Final Clinical Impression(s) / ED Diagnoses Final diagnoses:  None    Rx / DC Orders ED Discharge Orders     None        Davonna Belling, MD 04/16/21 [phone]

## 2021-04-17 ENCOUNTER — Encounter (HOSPITAL_COMMUNITY): Payer: Self-pay | Admitting: Internal Medicine

## 2021-04-17 ENCOUNTER — Inpatient Hospital Stay (HOSPITAL_COMMUNITY): Payer: 59

## 2021-04-17 DIAGNOSIS — I50813 Acute on chronic right heart failure: Secondary | ICD-10-CM

## 2021-04-17 DIAGNOSIS — I5033 Acute on chronic diastolic (congestive) heart failure: Secondary | ICD-10-CM | POA: Diagnosis present

## 2021-04-17 DIAGNOSIS — A419 Sepsis, unspecified organism: Secondary | ICD-10-CM | POA: Diagnosis present

## 2021-04-17 DIAGNOSIS — R0602 Shortness of breath: Secondary | ICD-10-CM | POA: Diagnosis present

## 2021-04-17 DIAGNOSIS — N390 Urinary tract infection, site not specified: Secondary | ICD-10-CM | POA: Diagnosis present

## 2021-04-17 LAB — COMPREHENSIVE METABOLIC PANEL
ALT: 34 U/L (ref 0–44)
AST: 31 U/L (ref 15–41)
Albumin: 3.3 g/dL — ABNORMAL LOW (ref 3.5–5.0)
Alkaline Phosphatase: 104 U/L (ref 38–126)
Anion gap: 11 (ref 5–15)
BUN: 17 mg/dL (ref 8–23)
CO2: 25 mmol/L (ref 22–32)
Calcium: 8.9 mg/dL (ref 8.9–10.3)
Chloride: 103 mmol/L (ref 98–111)
Creatinine, Ser: 1.24 mg/dL (ref 0.61–1.24)
GFR, Estimated: >60 mL/min (ref >60)
Glucose, Bld: 127 mg/dL — ABNORMAL HIGH (ref 70–99)
Potassium: 3.5 mmol/L (ref 3.5–5.1)
Sodium: 139 mmol/L (ref 135–145)
Total Bilirubin: 0.9 mg/dL (ref 0.3–1.2)
Total Protein: 7.7 g/dL (ref 6.5–8.1)

## 2021-04-17 LAB — BLOOD GAS, VENOUS
Acid-Base Excess: 1.6 mmol/L (ref 0.0–2.0)
Bicarbonate: 24.7 mmol/L (ref 20.0–28.0)
O2 Saturation: 92.3 %
Patient temperature: 37
pCO2, Ven: 32.6 mmHg — ABNORMAL LOW (ref 44.0–60.0)
pH, Ven: 7.492 — ABNORMAL HIGH (ref 7.250–7.430)
pO2, Ven: 62.6 mmHg — ABNORMAL HIGH (ref 32.0–45.0)

## 2021-04-17 LAB — PROCALCITONIN: Procalcitonin: 0.12 ng/mL

## 2021-04-17 LAB — CBC
HCT: 40.3 % (ref 39.0–52.0)
Hemoglobin: 12.9 g/dL — ABNORMAL LOW (ref 13.0–17.0)
MCH: 27.6 pg (ref 26.0–34.0)
MCHC: 32 g/dL (ref 30.0–36.0)
MCV: 86.1 fL (ref 80.0–100.0)
Platelets: 269 10*3/uL (ref 150–400)
RBC: 4.68 MIL/uL (ref 4.22–5.81)
RDW: 16.3 % — ABNORMAL HIGH (ref 11.5–15.5)
WBC: 12.9 10*3/uL — ABNORMAL HIGH (ref 4.0–10.5)
nRBC: 0 % (ref 0.0–0.2)

## 2021-04-17 LAB — RAPID URINE DRUG SCREEN, HOSP PERFORMED
Amphetamines: NOT DETECTED
Barbiturates: NOT DETECTED
Benzodiazepines: NOT DETECTED
Cocaine: NOT DETECTED
Opiates: NOT DETECTED
Tetrahydrocannabinol: POSITIVE — AB

## 2021-04-17 LAB — LACTIC ACID, PLASMA: Lactic Acid, Venous: 1.6 mmol/L (ref 0.5–1.9)

## 2021-04-17 LAB — PHOSPHORUS: Phosphorus: 3.2 mg/dL (ref 2.5–4.6)

## 2021-04-17 LAB — MAGNESIUM: Magnesium: 2 mg/dL (ref 1.7–2.4)

## 2021-04-17 MED ORDER — LEVALBUTEROL HCL 1.25 MG/0.5ML IN NEBU
1.2500 mg | INHALATION_SOLUTION | RESPIRATORY_TRACT | Status: DC | PRN
Start: 2021-04-17 — End: 2021-04-21

## 2021-04-17 MED ORDER — DILTIAZEM HCL ER COATED BEADS 180 MG PO CP24
360.0000 mg | ORAL_CAPSULE | Freq: Every day | ORAL | Status: DC
  Administered 2021-04-17 – 2021-04-21 (×5): 360 mg via ORAL
  Filled 2021-04-17 (×5): qty 2

## 2021-04-17 MED ORDER — LEVALBUTEROL HCL 1.25 MG/0.5ML IN NEBU
1.2500 mg | INHALATION_SOLUTION | Freq: Once | RESPIRATORY_TRACT | Status: AC
  Administered 2021-04-17: 1.25 mg via RESPIRATORY_TRACT
  Filled 2021-04-17: qty 0.5

## 2021-04-17 MED ORDER — POTASSIUM CHLORIDE CRYS ER 20 MEQ PO TBCR
20.0000 meq | EXTENDED_RELEASE_TABLET | Freq: Every day | ORAL | Status: DC
  Administered 2021-04-17 – 2021-04-21 (×5): 20 meq via ORAL
  Filled 2021-04-17 (×5): qty 1

## 2021-04-17 MED ORDER — DULOXETINE HCL 60 MG PO CPEP
60.0000 mg | ORAL_CAPSULE | Freq: Every day | ORAL | Status: DC
  Administered 2021-04-17 – 2021-04-21 (×5): 60 mg via ORAL
  Filled 2021-04-17 (×5): qty 1

## 2021-04-17 MED ORDER — CYCLOBENZAPRINE HCL 5 MG PO TABS
5.0000 mg | ORAL_TABLET | Freq: Three times a day (TID) | ORAL | Status: DC | PRN
  Administered 2021-04-17 – 2021-04-18 (×2): 5 mg via ORAL
  Filled 2021-04-17 (×2): qty 1

## 2021-04-17 MED ORDER — ROPINIROLE HCL 1 MG PO TABS
1.0000 mg | ORAL_TABLET | Freq: Every day | ORAL | Status: DC
  Administered 2021-04-17 – 2021-04-18 (×2): 1 mg via ORAL
  Filled 2021-04-17 (×2): qty 1

## 2021-04-17 MED ORDER — SPIRONOLACTONE 25 MG PO TABS
25.0000 mg | ORAL_TABLET | Freq: Every day | ORAL | Status: DC
  Administered 2021-04-17 – 2021-04-21 (×5): 25 mg via ORAL
  Filled 2021-04-17 (×5): qty 1

## 2021-04-17 MED ORDER — MELATONIN 3 MG PO TABS
3.0000 mg | ORAL_TABLET | Freq: Every day | ORAL | Status: DC
  Administered 2021-04-17 – 2021-04-20 (×4): 3 mg via ORAL
  Filled 2021-04-17 (×4): qty 1

## 2021-04-17 MED ORDER — GABAPENTIN 100 MG PO CAPS
100.0000 mg | ORAL_CAPSULE | Freq: Three times a day (TID) | ORAL | Status: DC
  Administered 2021-04-17 – 2021-04-18 (×3): 100 mg via ORAL
  Filled 2021-04-17 (×3): qty 1

## 2021-04-17 MED ORDER — APIXABAN 5 MG PO TABS
5.0000 mg | ORAL_TABLET | Freq: Two times a day (BID) | ORAL | Status: DC
  Administered 2021-04-17 – 2021-04-21 (×10): 5 mg via ORAL
  Filled 2021-04-17 (×10): qty 1

## 2021-04-17 MED ORDER — MAGNESIUM OXIDE -MG SUPPLEMENT 400 (240 MG) MG PO TABS
400.0000 mg | ORAL_TABLET | Freq: Every day | ORAL | Status: AC
  Administered 2021-04-17 – 2021-04-18 (×2): 400 mg via ORAL
  Filled 2021-04-17 (×2): qty 1

## 2021-04-17 MED ORDER — BIOTENE DRY MOUTH MT LIQD: 15.0000 mL | OROMUCOSAL | Status: DC | PRN

## 2021-04-17 MED ORDER — METHYLPREDNISOLONE SODIUM SUCC 40 MG IJ SOLR
40.0000 mg | Freq: Once | INTRAMUSCULAR | Status: AC
  Administered 2021-04-17: 40 mg via INTRAVENOUS
  Filled 2021-04-17: qty 1

## 2021-04-17 MED ORDER — MELATONIN 3 MG PO TABS
3.0000 mg | ORAL_TABLET | Freq: Every evening | ORAL | Status: DC | PRN
  Administered 2021-04-17: 3 mg via ORAL
  Filled 2021-04-17: qty 1

## 2021-04-17 MED ORDER — IPRATROPIUM BROMIDE 0.02 % IN SOLN
0.5000 mg | Freq: Once | RESPIRATORY_TRACT | Status: AC
  Administered 2021-04-17: 0.5 mg via RESPIRATORY_TRACT
  Filled 2021-04-17: qty 2.5

## 2021-04-17 MED ORDER — POTASSIUM CHLORIDE CRYS ER 20 MEQ PO TBCR
40.0000 meq | EXTENDED_RELEASE_TABLET | Freq: Once | ORAL | Status: AC
  Administered 2021-04-17: 40 meq via ORAL
  Filled 2021-04-17: qty 2

## 2021-04-17 MED ORDER — ROPINIROLE HCL 1 MG PO TABS
2.0000 mg | ORAL_TABLET | Freq: Every day | ORAL | Status: DC
  Administered 2021-04-17: 2 mg via ORAL
  Filled 2021-04-17 (×2): qty 2

## 2021-04-17 MED ORDER — FUROSEMIDE 10 MG/ML IJ SOLN
60.0000 mg | Freq: Two times a day (BID) | INTRAMUSCULAR | Status: DC
  Administered 2021-04-17 (×2): 60 mg via INTRAVENOUS
  Filled 2021-04-17 (×2): qty 6

## 2021-04-17 MED ORDER — FUROSEMIDE 10 MG/ML IJ SOLN
80.0000 mg | Freq: Two times a day (BID) | INTRAMUSCULAR | Status: DC
  Administered 2021-04-18 – 2021-04-19 (×4): 80 mg via INTRAVENOUS
  Filled 2021-04-17 (×4): qty 8

## 2021-04-17 NOTE — Progress Notes (Signed)
PROGRESS NOTE    [name]  HUD:[id number] DOB: 1956-03-30 DOA: 04/16/2021 PCP: [name], MD    Chief Complaint  Patient presents with   Shortness of Breath    Brief Narrative:  [name] is a 65 y.o. male with a hx of obesity, HTN, HLD, permanent atrial fibrillation,  mild to moderate MR, chronic diastolic heart failure, restless leg syndrome presented to ED due to short of breath, weight gain, lower extremity edema, found to be in acute on chronic diastolic CHF exacerbation Also had a fever 100.9 in the ED, currently on antibiotic for presumed UTI, urine culture in process  Subjective: 3.5 L urine output since admission, remains short of breath and complaining about being swollen, appear improved a little Denies chest pain C/o bilateral leg pain Wife at bedside , reports his general health has overall declined for the last few months  Assessment & Plan:   Principal Problem:   Acute on chronic diastolic CHF (congestive heart failure) (HCC) Active Problems:   Anxiety with depression   Essential hypertension   Persistent atrial fibrillation (HCC)   SOB (shortness of breath)   Sepsis ([place])   Acute lower UTI   Acute on chronic diastolic CHF, mild to moderate mitral regurgitation -Present with [place] difficult volume overload, respiratory distress -Chest x-ray "Cardiac enlargement with mild vascular congestion. No edema or Consolidation" -Report worse started on Demadex a month ago, dose increased 2 weeks ago, however it did not help -Appear diuresing well on IV Lasix, continue IV Lasix -Cardiology consulted, will follow recommendation  Persistent A. Fib With severe left atrial enlargement Rate controlled on Coreg and Cardizem, also on Eliquis Cardiology consulted, will follow recommendation  HTN Currently on Coreg, Cardizem, IV Lasix  Fever 100.9 in the ED UA suggest UTI, he does has leukocytosis Urine culture pending Continue current  antibiotics Follow-up on culture result  Restless leg syndrome Significant leg pain for the last year progressively got worse Was referred to urology however report did not make 2 appointments several times Patient request neurology referral to [facility], ambulatory to neurology referral order placed  Patient report Neurontin helped in the past, start low-dose Neurontin Continue Requip Continue Cymbalta Check iron level  Class III obesity: Body mass index is 40.16 kg/m.[place] Suspect underlying OSA, he does not appear to be interested in sleep study, he does not want CPAP       Unresulted Labs (From admission, onward)     Start     Ordered   04/18/21 0500  Iron and TIBC  Tomorrow morning,   R        04/17/21 1505   04/18/21 0500  CBC  Tomorrow morning,   R        04/17/21 1506   04/18/21 8588  Basic metabolic panel  Tomorrow morning,   R        04/17/21 1506   04/18/21 0500  Magnesium  Tomorrow morning,   R        04/17/21 1506   04/16/21 2356  Urine Culture  Add-on,   AD       Question:  Indication  Answer:  Dysuria   04/16/21 2356   04/16/21 2356  Culture, blood (Routine X 2) w Reflex to ID Panel  BLOOD CULTURE X 2,   R (with TIMED occurrences)      04/16/21 2356   Pending  Urine Culture  Once,   R        Pending  DVT prophylaxis: SCDs Start: 04/16/21 2326 apixaban (ELIQUIS) tablet 5 mg   Code Status: Full Family Communication: Wife at bedside Disposition:   Status is: Inpatient  Dispo: The patient is from: Home              Anticipated d/c is to: Home              Anticipated d/c date is: Significant volume overload, will need several days of diuretics, need cardiology clearance                 Consultants:  Cardiology  Procedures:  None  Antimicrobials:   Anti-infectives (From admission, onward)    Start     Dose/Rate Route Frequency Ordered Stop   04/17/21 0045  cefTRIAXone (ROCEPHIN) 1 g in sodium chloride 0.9 % 100 mL IVPB         1 g 200 mL/hr over 30 Minutes Intravenous Every 24 hours 04/16/21 2356             Objective: Vitals:   04/17/21 1008 04/17/21 1100 04/17/21 1200 04/17/21 1300  BP: (!) 152/103 (!) 144/94  (!) 147/90  Pulse:    60  Resp:  (!) 21 (!) 33 17  Temp:      TempSrc:      SpO2:   98% 94%  Weight:      Height:        Intake/Output Summary (Last 24 hours) at 04/17/2021 1810 Last data filed at 04/17/2021 1500 Gross per 24 hour  Intake 562 ml  Output 4650 ml  Net -4088 ml   Filed Weights   04/16/21 1703 04/17/21 0103  Weight: (!) 143.8 kg (!) 153.6 kg    Examination:  General exam: alert, awake, communicative, anxious, complain leg pain Respiratory system: Clear to auscultation. Respiratory effort normal. Cardiovascular system:  IRRR.  Gastrointestinal system: Abdomen is nondistended, soft and nontender.  Normal bowel sounds heard. Central nervous system: Alert and oriented. No focal neurological deficits. Extremities:  + edema Skin: No rashes, lesions or ulcers Psychiatry: Anxious    Data Reviewed: I have personally reviewed following labs and imaging studies  CBC: Recent Labs  Lab 04/16/21 1607 04/17/21 0133  WBC 9.8 12.9*  NEUTROABS 7.4  --   HGB 12.4* 12.9*  HCT 37.8* 40.3  MCV 85.1 86.1  PLT 251 962    Basic Metabolic Panel: Recent Labs  Lab 04/16/21 1607 04/17/21 0133  NA 140 139  K 4.5 3.5  CL 108 103  CO2 23 25  GLUCOSE 110* 127*  BUN 19 17  CREATININE 0.94 1.24  CALCIUM 9.1 8.9  MG 2.1 2.0  PHOS 3.3 3.2    GFR: Estimated Creatinine Clearance: 99.1 mL/min (by C-G formula based on SCr of 1.24 mg/dL).  Liver Function Tests: Recent Labs  Lab 04/17/21 0133  AST 31  ALT 34  ALKPHOS 104  BILITOT 0.9  PROT 7.7  ALBUMIN 3.3*    CBG: No results for input(s): GLUCAP in the last 168 hours.   Recent Results (from the past 240 hour(s))  Resp Panel by RT-PCR (Flu A&B, Covid) Nasopharyngeal Swab     Status: None   Collection Time:  04/16/21  7:49 PM   Specimen: Nasopharyngeal Swab; Nasopharyngeal(NP) swabs in vial transport medium  Result Value Ref Range Status   SARS Coronavirus 2 by RT PCR NEGATIVE NEGATIVE Final    Comment: (NOTE) SARS-CoV-2 target nucleic acids are NOT DETECTED.  The SARS-CoV-2 RNA is generally detectable in upper  respiratory specimens during the acute phase of infection. The lowest concentration of SARS-CoV-2 viral copies this assay can detect is 138 copies/mL. A negative result does not preclude SARS-Cov-2 infection and should not be used as the sole basis for treatment or other patient management decisions. A negative result may occur with  improper specimen collection/handling, submission of specimen other than nasopharyngeal swab, presence of viral mutation(s) within the areas targeted by this assay, and inadequate number of viral copies(<138 copies/mL). A negative result must be combined with clinical observations, patient history, and epidemiological information. The expected result is Negative.  Fact Sheet for Patients:  EntrepreneurPulse.com.au  Fact Sheet for Healthcare Providers:  IncredibleEmployment.be  This test is no t yet approved or cleared by the [place] FDA and  has been authorized for detection and/or diagnosis of SARS-CoV-2 by FDA under an Emergency Use Authorization (EUA). This EUA will remain  in effect (meaning this test can be used) for the duration of the COVID-19 declaration under Section 564(b)(1) of the Act, 21 U.S.C.section 360bbb-3(b)(1), unless the authorization is terminated  or revoked sooner.       Influenza A by PCR NEGATIVE NEGATIVE Final   Influenza B by PCR NEGATIVE NEGATIVE Final    Comment: (NOTE) The Xpert Xpress SARS-CoV-2/FLU/RSV plus assay is intended as an aid in the diagnosis of influenza from Nasopharyngeal swab specimens and should not be used as a sole basis for treatment. Nasal washings  and aspirates are unacceptable for Xpert Xpress SARS-CoV-2/FLU/RSV testing.  Fact Sheet for Patients: EntrepreneurPulse.com.au  Fact Sheet for Healthcare Providers: IncredibleEmployment.be  This test is not yet approved or cleared by the [place] FDA and has been authorized for detection and/or diagnosis of SARS-CoV-2 by FDA under an Emergency Use Authorization (EUA). This EUA will remain in effect (meaning this test can be used) for the duration of the COVID-19 declaration under Section 564(b)(1) of the Act, 21 U.S.C. section 360bbb-3(b)(1), unless the authorization is terminated or revoked.  Performed at [facility], [address], [place], [place] [address]          Radiology Studies: DG Chest 2 View  Result Date: 04/16/2021 CLINICAL DATA:  Shortness of breath EXAM: CHEST - 2 VIEW COMPARISON:  Chest x-ray 11/17/2018, CT chest 11/17/2018, chest x-ray 07/15/2004 FINDINGS: The heart and mediastinal contours are unchanged. Biapical pleural/pulmonary scarring. No focal consolidation. No pulmonary edema. No pleural effusion. No pneumothorax. No acute osseous abnormality. IMPRESSION: No active cardiopulmonary disease. Electronically Signed   By: Iven Finn M.D.   On: 04/16/2021 15:19   DG Chest Port 1 View  Result Date: 04/17/2021 CLINICAL DATA:  Acute respiratory disease EXAM: PORTABLE CHEST 1 VIEW COMPARISON:  04/16/2021 FINDINGS: Shallow inspiration. Cardiac enlargement. Probable central pulmonary vascular congestion. No airspace disease or consolidation. No pleural effusions. No pneumothorax. Mediastinal contours appear intact. IMPRESSION: Cardiac enlargement with mild vascular congestion. No edema or consolidation. Electronically Signed   By: Lucienne Capers M.D.   On: 04/17/2021 03:09        Scheduled Meds:  apixaban  5 mg Oral BID   carvedilol  25 mg Oral BID   diltiazem  360 mg Oral Daily    DULoxetine  60 mg Oral Daily   [START ON 04/18/2021] furosemide  80 mg Intravenous BID   gabapentin  100 mg Oral TID   influenza vac split quadrivalent PF  0.5 mL Intramuscular Tomorrow-1000   magnesium oxide  400 mg Oral Daily   melatonin  3 mg Oral QHS  potassium chloride SA  20 mEq Oral Daily   potassium chloride  40 mEq Oral Once   rOPINIRole  1 mg Oral Q1400   rOPINIRole  2 mg Oral QHS   spironolactone  25 mg Oral Daily   Continuous Infusions:  cefTRIAXone (ROCEPHIN)  IV 1 g (04/17/21 0017)     LOS: 1 day   Time spent: 23mins Greater than 50% of this time was spent in counseling, explanation of diagnosis, planning of further management, and coordination of care.   Voice Recognition Viviann Spare dictation system was used to create this note, attempts have been made to correct errors. Please contact the author with questions and/or clarifications.   Florencia Reasons, MD PhD FACP [facility]  Available via Epic secure chat 7am-7pm for nonurgent issues Please page for urgent issues To page the attending provider between 7A-7P or the covering provider during after hours 7P-7A, please log into the web site [email or web address] and access using universal [place] password for that web site. If you do not have the password, please call the hospital operator.    04/17/2021, 6:10 PM

## 2021-04-17 NOTE — Significant Event (Signed)
Rapid Response Event Note   Reason for Call : Tachypnea and anxious behavior  Initial Focused Assessment:  Caryl Pina RN notified me of Mr. Hyder's RR 28-32 and anxious behavior.  Upon arrival, pt is sitting upright on the edge of the bed, alert, oriented x4. He could talk in 2-3 word answers with some mild distress and forced exhalation causing an upper airway wheezing. He is anxious and impulsive getting out of bed and generally looking uncomfortable. Skin warm pink and dry. No JVD. BBS with fine rales bilaterally and upper airway expiratory wheeze. No wheezing heard in the lower airways. Abdomen is large and causing poor compliance when he sits contributing to his discomfort. Duoneb was given prior to my arrival as well as Lasix and Ativan recently. Peripheral extremities have edema with some pitting in the lower extremities. PCXR ordered and pt repositioned in the recliner. He is now resting comfortably.   0250-98.3 F, HR 99 afib, 138/98 (109), RR 26 with sats 100 on 4L [place].    Interventions:  -Stat PCXR  Plan of Care:  -Notify primary svc of events and further orders.      MD Notified:  per primary RN Call Time: 0217 Arrival Time: 0222 End Time: 0300  Madelynn Done, RN

## 2021-04-17 NOTE — Telephone Encounter (Signed)
Currently admitted.

## 2021-04-17 NOTE — Consult Note (Addendum)
Cardiology Consultation:   Patient ID: ARISTIDES LUCKEY MRN: [id number]; DOB: [birth year]  Admit date: 04/16/2021 Date of Consult: 04/17/2021  PCP:  [name], MD   [facility] Providers Cardiologist:  Kirk Ruths, MD   Patient Profile:   KINGSTYN DERUITER is a 65 y.o. male with a hx of permanent atrial fibrillation, hypertension, mild to moderate MR, chronic diastolic heart failure, hyperlipidemia, GERD, morbidly obese who is being seen 04/17/2021 for the evaluation of CHF at the request of Dr. Erlinda Hong.  History of Present Illness:   Mr. Ku is a 65 year old male with past medical history noted above.  He underwent cardiac catheterization in 2004 that showed an EF of 45% with normal coronary arteries.  He was also noted to have atrial fibrillation at that time and his EF improved after sinus rhythm was restored.  He ultimately had an atrial fibrillation ablation.  Notes indicate he did well for several years and then developed recurrent atrial fibrillation.  Underwent TEE 02/2019 that showed normal LV function with severe atrial enlargement, moderate to severe MR and mild TR.  He had a cardioversion later that month but had recurrent atrial fibrillation.  He has been seen by Dr. Rayann Heman and it is felt that repeat ablation is not indicated as the chance of maintaining sinus rhythm would be lower given his obesity and severe left atrial enlargement.  He was considered for maze procedure and referred to Dr. Roxy Manns as an outpatient and it was felt that medical therapy would be the best option.  Repeat echocardiogram 03/2021 showed normal LV function, mild LVH, mild RV enlargement, mild left atrial enlargement with mild to moderate MR.  He was most recently seen 04/10/2021 with complaints of bilateral lower extremity edema.  He had recently increased his Demadex to 20 mg BID prior to this visit.  He was instructed to increase his dose to 40 mg in the morning and 20 mg in the afternoon for 2 days  and to resume 20 mg twice a day.  It was noted to consider the addition of Jardiance and spironolactone at follow-up visit if stable labs noted.  In regards to his atrial fibrillation plan is for rate control, he has been maintained on Coreg 25 mg twice daily Cardizem 360 mg daily and Eliquis 5 mg twice daily.   In talking with patient and wife at the bedside he clearly has struggle with dietary discretion.  He frequently eats [employer] on a regular basis and tends to drink significant amounts of water.  He is also struggled with physical activity secondary to pain in his legs over the past year.  Wife normally does most of the cooking but says she does add salt on a regular basis to their meals.  He does not consistently weigh himself at home.  States that he did increase his torsemide dosing as noted above but did not see significant urine output as a result.  Symptoms worsened over the past several days ultimately prompting him to present to the ED at [facility].   Labs on admission showed stable electrolytes, creatinine 0.9, BNP 334, WBC 9.8, hemoglobin 12.4, TSH 1.3.  Chest x-ray showed no acute findings.  EKG showed atrial fibrillation, 74 bpm, incomplete right bundle.  He was given IV Lasix 80 mg x 1 along with IV Ativan and nebs in the ED with some improvement. He was transferred to [facility] for further management.  Had an episode after arrival to [facility] with worsening dyspnea with rapid  response called.  He was given a DuoNeb as well as Lasix and Ativan.  Repeat chest x-ray showed vascular congestion.  In speaking with him and his wife it is clear that his anxiety levels are contributing to his symptoms as well.  When he develops significant dyspnea his anxiety flares as well.  Also complains of significant lower extremity pain, seems most consistent with neuropathy.  This seems to be his main concern right now. Does not seem to have good insight regarding CHF diagnosis. States he has never  been formally diagnosed with sleep apnea but wife reports he snores.  He also reports daytime somnolence. Appears he has been set up for outpatient sleep study in the past but has never completed.  States most of the time he is "wiped out" by lunchtime every day.  Past Medical History:  Diagnosis Date   Allergic rhinitis    Anxiety    Asthma    as a child   Chronic low back pain    Complication of anesthesia    "hard to put asleep, hard to wake up, nausea and vomiting"   Depression    ED (erectile dysfunction)    GERD (gastroesophageal reflux disease)    HLD (hyperlipidemia)    HTN (hypertension)    sees Dr. [name], [facility]   Hypertrophy of prostate with urinary obstruction and other lower urinary tract symptoms (LUTS)    IBS (irritable bowel syndrome)    Inguinal hernia without mention of obstruction or gangrene, unilateral or unspecified, (not specified as recurrent)    Insomnia    Lumbar herniated disc    L7   Morbid obesity ([place])    Narcotic addiction ([place])    NICM (nonischemic cardiomyopathy) ([place])    tachycardia mediated,  resolved with sinus   Persistent atrial fibrillation ([place])    ablation done 03/2006 at [facility]   Pneumonia    hx of 2004   PONV (postoperative nausea and vomiting)    Twitching    legs    Past Surgical History:  Procedure Laterality Date   ATRIAL ABLATION SURGERY  2007   [facility]   CARDIOVERSION  08/11/2011   Procedure: CARDIOVERSION;  Surgeon: Lelon Perla, MD;  Location: [place];  Service: Cardiovascular;  Laterality: N/A;   CARDIOVERSION N/A 04/16/2017   Procedure: CARDIOVERSION;  Surgeon: Sanda Klein, MD;  Location: [facility] ENDOSCOPY;  Service: Cardiovascular;  Laterality: N/A;   CARDIOVERSION N/A 03/04/2019   Procedure: CARDIOVERSION;  Surgeon: Lelon Perla, MD;  Location: [facility] ENDOSCOPY;  Service: Cardiovascular;  Laterality: N/A;   CARDIOVERSION N/A 07/04/2019   Procedure: CARDIOVERSION;  Surgeon: [name], MD;   Location: [facility] ENDOSCOPY;  Service: Cardiovascular;  Laterality: N/A;   EYE SURGERY     lasik, [place] Right 08/26/2012   Procedure: LAPAROSCOPIC INGUINAL HERNIA;  Surgeon: Madilyn Hook, DO;  Location: [place];  Service: General;  Laterality: Right;  laparoscopic right inguinal hernia repair with mesh, umbilical hernia repair   INSERTION OF MESH Right 08/26/2012   Procedure: INSERTION OF MESH;  Surgeon: Madilyn Hook, DO;  Location: [place];  Service: General;  Laterality: Right;  right inguinal hernia   TEE WITHOUT CARDIOVERSION N/A 03/04/2019   Procedure: TRANSESOPHAGEAL ECHOCARDIOGRAM (TEE);  Surgeon: Lelon Perla, MD;  Location: [place];  Service: Cardiovascular;  Laterality: N/A;   UMBILICAL HERNIA REPAIR N/A 08/26/2012   Procedure: HERNIA REPAIR UMBILICAL ADULT;  Surgeon:  Madilyn Hook, DO;  Location: ;  Service: General;  Laterality: N/A;     Home Medications:  Prior to Admission medications   Medication Sig Start Date End Date Taking? Authorizing Provider  carvedilol (COREG) 25 MG tablet Take 1 tablet (25 mg total) by mouth 2 (two) times daily. 12/11/20  Yes Lelon Perla, MD  diltiazem (CARDIZEM CD) 360 MG 24 hr capsule Take 1 capsule (360 mg total) by mouth daily. 08/09/20  Yes Lelon Perla, MD  DULoxetine (CYMBALTA) 60 MG capsule SMARTSIG:1 Capsule(s) By Mouth Every Evening 02/26/21  Yes [provider]  ELIQUIS 5 MG TABS tablet TAKE 1 TABLET(5 MG) BY MOUTH TWICE DAILY 08/07/20  Yes Crenshaw, Denice Bors, MD  potassium chloride SA (KLOR-CON) 20 MEQ tablet Take 1 tablet (20 mEq total) by mouth daily. 01/17/21  Yes Lelon Perla, MD  rOPINIRole (REQUIP) 1 MG tablet Take 1-2 mg by mouth at bedtime as needed (restless leg syndrome). 1 tablet after lunch and 2 tablet at bedtime 02/24/19  Yes [provider]  torsemide (DEMADEX) 20 MG tablet Take 1 tablet (20 mg total) by mouth daily. 01/17/21 04/17/21 Yes Crenshaw, Denice Bors, MD  ALPRAZolam Duanne Moron) 0.5 MG tablet TAKE 1 TABLET(0.5 MG) BY MOUTH AT BEDTIME AS NEEDED FOR ANXIETY OR SLEEP Patient not taking: No sig reported 02/07/20   Marcial Pacas, MD  montelukast (SINGULAIR) 10 MG tablet Take 10 mg by mouth daily as needed (sob).  01/05/20   [provider]  olmesartan (BENICAR) 40 MG tablet Take 1 tablet (40 mg total) by mouth daily. Patient not taking: No sig reported 12/11/20   Lelon Perla, MD  tadalafil (CIALIS) 20 MG tablet Take 20 mg by mouth daily as needed. 08/31/20   [provider]    Inpatient Medications: Scheduled Meds:  apixaban  5 mg Oral BID   carvedilol  25 mg Oral BID   diltiazem  360 mg Oral Daily   DULoxetine  60 mg Oral Daily   [START ON 04/18/2021] furosemide  80 mg Intravenous BID   gabapentin  100 mg Oral TID   influenza vac split quadrivalent PF  0.5 mL Intramuscular Tomorrow-1000   magnesium oxide  400 mg Oral Daily   melatonin  3 mg Oral QHS   potassium chloride SA  20 mEq Oral Daily   rOPINIRole  1 mg Oral Q1400   rOPINIRole  2 mg Oral QHS   spironolactone  25 mg Oral Daily   Continuous Infusions:  cefTRIAXone (ROCEPHIN)  IV 1 g (04/17/21 0017)   PRN Meds: acetaminophen **OR** acetaminophen, antiseptic oral rinse, cyclobenzaprine, hydrALAZINE, levalbuterol, LORazepam  Allergies:    Allergies  Allergen Reactions   Hydrocodone Other (See Comments)    GI upset, dizziness    Social History:   Social History   Socioeconomic History   Marital status: Married    Spouse name: Krikor Willet   Number of children: 3   Years of education: Not on file   Highest education level: Bachelor's degree (e.g., BA, AB, BS)  Occupational History   Occupation: retired  Tobacco Use   Smoking status: Never   Smokeless tobacco: Never  Vaping Use   Vaping Use: Never used  Substance and Sexual Activity   Alcohol use: No    Alcohol/week: 0.0 standard drinks   Drug use: No   Sexual activity: Yes  Other Topics Concern    Not on file  Social History Narrative   Lives in [place]  Social Determinants of Health   Financial Resource Strain: Low Risk    Difficulty of Paying Living Expenses: Not hard at all  Food Insecurity: No Food Insecurity   Worried About Charity fundraiser in the Last Year: Never true   [facility] in the Last Year: Never true  Transportation Needs: No Transportation Needs   Lack of Transportation (Medical): No   Lack of Transportation (Non-Medical): No  Physical Activity: Not on file  Stress: Not on file  Social Connections: Not on file  Intimate Partner Violence: Not on file    Family History:    Family History  Problem Relation Age of Onset   Asthma Mother    Breast cancer Mother    Hypertension Mother    COPD Father    Prostate cancer Father    Hypertension Father    Lymphoma Sister      ROS:  Please see the history of present illness.   All other ROS reviewed and negative.     Physical Exam/Data:   Vitals:   04/17/21 1008 04/17/21 1100 04/17/21 1200 04/17/21 1300  BP: (!) 152/103 (!) 144/94  (!) 147/90  Pulse:    60  Resp:  (!) 21 (!) 33 17  Temp:      TempSrc:      SpO2:   98% 94%  Weight:      Height:        Intake/Output Summary (Last 24 hours) at 04/17/2021 1719 Last data filed at 04/17/2021 1500 Gross per 24 hour  Intake 562 ml  Output 4650 ml  Net -4088 ml   Last 3 Weights 04/17/2021 04/16/2021 04/10/2021  Weight (lbs) 338 lb 10 oz 317 lb 339 lb 6.4 oz  Weight (kg) 153.6 kg 143.79 kg 153.951 kg     Body mass index is 40.16 kg/m.  General:  Well nourished, well developed, in no acute distress HEENT: normal Neck: no JVD Vascular: No carotid bruits; Distal pulses 2+ bilaterally Cardiac:  normal S1, S2; Irreg Irreg; soft systolic murmur  Lungs: Diminished in bases bilaterally Abd: soft, nontender, no hepatomegaly  Ext: 2-3+ pitting lower extremity edema bilaterally Musculoskeletal:  No deformities, BUE and BLE strength normal and  equal Skin: warm and dry  Neuro:  CNs 2-12 intact, no focal abnormalities noted Psych:  Normal affect   EKG:  The EKG was personally reviewed and demonstrates:  Afib 72 bpm Telemetry:  Telemetry was personally reviewed and demonstrates:  Afib, rate 80-90s  Relevant CV Studies:  Echo: 03/2021  IMPRESSIONS     1. Left ventricular ejection fraction, by estimation, is 55 to 60%. The  left ventricle has normal function. The left ventricle has no regional  wall motion abnormalities. There is mild concentric left ventricular  hypertrophy. Left ventricular diastolic  parameters are indeterminate.   2. Right ventricular systolic function is normal. The right ventricular  size is mildly enlarged.   3. Left atrial size was mildly dilated.   4. The mitral valve is normal in structure. Mild to moderate mitral valve  regurgitation.   5. The aortic valve is tricuspid. Aortic valve regurgitation is not  visualized. No aortic stenosis is present.   6. The inferior vena cava is normal in size with greater than 50%  respiratory variability, suggesting right atrial pressure of 3 mmHg.   Comparison(s): A prior study was performed on 01/19/2020. Systolic blunting  of the pulmonary vein Doppler signal similar from prior.   FINDINGS   Left  Ventricle: Left ventricular ejection fraction, by estimation, is 55  to 60%. The left ventricle has normal function. The left ventricle has no  regional wall motion abnormalities. The left ventricular internal cavity  size was normal in size. There is   mild concentric left ventricular hypertrophy. Left ventricular diastolic  parameters are indeterminate.   Right Ventricle: The right ventricular size is mildly enlarged. No  increase in right ventricular wall thickness. Right ventricular systolic  function is normal.   Left Atrium: Left atrial size was mildly dilated.   Right Atrium: Right atrial size was normal in size.   Pericardium: There is no evidence of  pericardial effusion.   Mitral Valve: The mitral valve is normal in structure. Mild to moderate  mitral valve regurgitation.   Tricuspid Valve: The tricuspid valve is grossly normal. Tricuspid valve  regurgitation is not demonstrated. No evidence of tricuspid stenosis.   Aortic Valve: The aortic valve is tricuspid. Aortic valve regurgitation is  not visualized. No aortic stenosis is present.   Pulmonic Valve: The pulmonic valve was not well visualized. Pulmonic valve  regurgitation is not visualized. No evidence of pulmonic stenosis.   Aorta: The aortic root and ascending aorta are structurally normal, with  no evidence of dilitation.   Venous: The inferior vena cava is normal in size with greater than 50%  respiratory variability, suggesting right atrial pressure of 3 mmHg.   IAS/Shunts: There is right bowing of the interatrial septum, suggestive of  elevated left atrial pressure. The atrial septum is grossly normal.   Laboratory Data:  High Sensitivity Troponin:  No results for input(s): TROPONINIHS in the last 720 hours.   Chemistry Recent Labs  Lab 04/16/21 1607 04/17/21 0133  NA 140 139  K 4.5 3.5  CL 108 103  CO2 23 25  GLUCOSE 110* 127*  BUN 19 17  CREATININE 0.94 1.24  CALCIUM 9.1 8.9  MG 2.1 2.0  GFRNONAA >60 >60  ANIONGAP 9 11    Recent Labs  Lab 04/17/21 0133  PROT 7.7  ALBUMIN 3.3*  AST 31  ALT 34  ALKPHOS 104  BILITOT 0.9   Lipids No results for input(s): CHOL, TRIG, HDL, LABVLDL, LDLCALC, CHOLHDL in the last 168 hours.  Hematology Recent Labs  Lab 04/16/21 1607 04/17/21 0133  WBC 9.8 12.9*  RBC 4.44 4.68  HGB 12.4* 12.9*  HCT 37.8* 40.3  MCV 85.1 86.1  MCH 27.9 27.6  MCHC 32.8 32.0  RDW 16.1* 16.3*  PLT 251 269   Thyroid  Recent Labs  Lab 04/16/21 1607  TSH 1.349    BNP Recent Labs  Lab 04/16/21 1607  BNP 334.4*    DDimer No results for input(s): DDIMER in the last 168 hours.   Radiology/Studies:  DG Chest 2  View  Result Date: 04/16/2021 CLINICAL DATA:  Shortness of breath EXAM: CHEST - 2 VIEW COMPARISON:  Chest x-ray 11/17/2018, CT chest 11/17/2018, chest x-ray 07/15/2004 FINDINGS: The heart and mediastinal contours are unchanged. Biapical pleural/pulmonary scarring. No focal consolidation. No pulmonary edema. No pleural effusion. No pneumothorax. No acute osseous abnormality. IMPRESSION: No active cardiopulmonary disease. Electronically Signed   By: Iven Finn M.D.   On: 04/16/2021 15:19   DG Chest Port 1 View  Result Date: 04/17/2021 CLINICAL DATA:  Acute respiratory disease EXAM: PORTABLE CHEST 1 VIEW COMPARISON:  04/16/2021 FINDINGS: Shallow inspiration. Cardiac enlargement. Probable central pulmonary vascular congestion. No airspace disease or consolidation. No pleural effusions. No pneumothorax. Mediastinal contours appear intact. IMPRESSION: Cardiac  enlargement with mild vascular congestion. No edema or consolidation. Electronically Signed   By: Lucienne Capers M.D.   On: 04/17/2021 03:09     Assessment and Plan:   KLYE BESECKER is a 65 y.o. male with a hx of permanent atrial fibrillation, hypertension, mild to moderate MR, chronic diastolic heart failure, hyperlipidemia, GERD who is being seen 04/17/2021 for the evaluation of CHF at the request of Dr. Erlinda Hong.  Acute on chronic diastolic heart failure: Has been struggling with symptoms for quite some time or specifically over the past week.  His outpatient torsemide was adjusted but unfortunately he did not respond well.  On admission was noted to have elevated BNP of 334, mild vascular congestion on chest x-ray.  He has received IV Lasix, currently net -4 L stable creatinine.  He remains significantly volume overloaded, will need several days of diuretic. -- GDMT: On Coreg 25 mg twice daily, recommend adding spironolactone 25 mg daily. Would benefit from hydralazine/nitrate combo. Hold on adding SGLT2 as he is receiving treatment for  UTI -- Continue IV Lasix, will further increase to 80mg  BID -- Long discussion with patient and wife regarding low-sodium diet, daily weights and fluid intake.  He would benefit from [facility] impact heart failure clinic follow-up. -- daily weights, I&Os  Persistent atrial fibrillation: Long history of the same, status post ablation and multiple cardioversions.  Severe left atrial enlargement. Current plan is for rate control.  Heart rate initially elevated on admission but currently in the 80s to 90s.  -- Continue on Coreg 25 mg twice daily, diltiazem 360 mg daily -- Continue Eliquis 5 mg twice daily  Hypertension: Blood pressures remain significantly elevated. --Continue Coreg 25 mg twice daily, diltiazem 360 mg daily, add spironolactone 25 mg daily. Plan to add ARB pending stable renal function (was on Benicar PTA)  Mild to moderate MR: Noted on echo 9/27  Suspected OSA: He denies a formal diagnosis of this.  Appears he has been set up for sleep studies on several occasions but has not completed.  Stressed the importance of follow-through with outpatient evaluation. --We will plan for outpatient evaluation if patient remains agreeable  UTI: Developed low-grade fever on arrival to [facility]. UA positive.  --Antibiotics per primary --As above hold on SGLT2 in the setting of acute UTI  Generalized anxiety disorder: He has been on Cymbalta as well as Xanax as an outpatient -- per primary   Risk Assessment/Risk Scores:   New York Heart Association (NYHA) Functional Class NYHA Class III  CHA2DS2-VASc Score = 2  This indicates a 2.2% annual risk of stroke. The patient's score is based upon: CHF History: 1 HTN History: 1 Diabetes History: 0 Stroke History: 0 Vascular Disease History: 0 Age Score: 0 Gender Score: 0     For questions or updates, please contact [place] Please consult [email or web address] for contact info under    Signed, Reino Bellis, NP  04/17/2021 5:19 PM  I have  seen and examined the patient along with Reino Bellis, NP .  I have reviewed the chart, notes and new data.  I agree with PA/NP's note.  Key new complaints: Marked and rapid weight gain, dyspnea at rest (improved after diuresis), no chest pain, unaware of palpitations.  He has been drinking Gatorade to help with muscle cramps.  He eats [employer] sandwiches daily.  He was not aware of the massive sodium amount and deli meats.  Eats Lean Cuisine at home. Key examination changes: Severe/morbidly obese, hard to see  jugular veins, clear lungs, irregular rhythm, barely audible apical murmur.  Weight appears to be at least 40 pounds above his "dry weight" a year ago.  I suspect most of this is fluid gain. Key new findings / data: Atrial fibrillation with controlled ventricular response.  Recently performed echo reviewed.  His left atrium is massively dilated with an end-systolic diameter of 6.5 cm (not mildly dilated).  Left ventricular systolic function appears to be normal, suspect that the mitral regurgitation is at least moderate, but it does not appear to be severe.  The right ventricle is dilated and depressed.  BNP is over 300 (baseline just 2 years ago was normal at 35).  PLAN: Massive hypervolemia, primarily right heart failure.  In turn this is probably due to untreated obstructive sleep apnea and severe obesity. Also has signs and symptoms of left heart failure, due to a combination of diastolic dysfunction and mitral insufficiency. Permanent atrial fibrillation, with this degree of left atrial dilation it will be impossible to restore normal rhythm.  However the rhythm is well rate controlled and he is appropriately anticoagulated.  Continue intravenous diuretics.  Target weight less than 300 pounds.  Ideally bringing the BNP down to less than 100.  Suspect blood pressure will improve with diuresis.  Also hope that tricuspid and mitral valve insufficiency will improve with better  diuresis.  Had a long discussion regarding dietary sodium restriction, daily weight monitoring, signs and symptoms of heart failure exacerbation, the need to intervene in treatment of hypervolemia early while oral medication adjustment still helps, before he needs intravenous medications.  Also had a relatively brief discussion regarding the impact of untreated obstructive sleep apnea, which he cut short by saying he will never use CPAP.  Although sleep studies have been recommended repeatedly over the years, he is never completed one.  He states that he will not consider a sleep study while he still has leg pain and restless legs at night.  I tried to point out that restless leg syndrome and obstructive sleep apnea or related conditions and treatment of sleep apnea may improve this complaint as well.  I do not think he wanted to hear that.  We will continue to try to educate regarding the negative impact of sleep apnea on heart failure and arrhythmia and overall prognosis.  He will need several days of inpatient intravenous diuretic therapy.  Sanda Klein, MD, [place] [phone] 04/17/2021, 5:29 PM

## 2021-04-17 NOTE — Progress Notes (Addendum)
Heart Failure Nurse Navigator Progress Note  PCP: [name], MD PCP-Cardiologist: Thresa Ross., MD Admission Diagnosis: A/C dCHF Admitted from: home with spouse  Presentation:   [name] presented 10/11 with increased SHOB and anxiety. Pt recently saw Dr. Stanford Breed and had ECHO 9/27. Spouse at bedside, permission from pt to interview with spouse present. Pt very anxious, breathing heavily, RR 25, noted grunting at end of exhale. Pt and spouse interactive with interview process. Pt states he has gained a significant amount of weight this calendar year. Pt states his normal daily diet consists of protein bar or omelette for breakfast, chickfila for lunch, and home cooked meal for dinner. Pt states he sleeps with HOB elevated. Does not get much sleep d/t restless legs.  Spend majority of time educating on HF and fluid overload related to work of breathing and anxiety. Explained progress of hospitalization regarding lasix, AM labs/weights, fluid/sodium restrictions.  Explained benefits of HV TOC clinic, with potential plan for appt upon DC to bridge back to cardiology appt.   Pt breathing better standing up. Pt more relaxed by end of interview.    ECHO/ LVEF: 55-60%, mild LVH. RV mildly enlarged. Mild-mod MVR.   Clinical Course:  Past Medical History:  Diagnosis Date   Allergic rhinitis    Anxiety    Asthma    as a child   Chronic low back pain    Complication of anesthesia    "hard to put asleep, hard to wake up, nausea and vomiting"   Depression    ED (erectile dysfunction)    GERD (gastroesophageal reflux disease)    HLD (hyperlipidemia)    HTN (hypertension)    sees Dr. [name], [facility]   Hypertrophy of prostate with urinary obstruction and other lower urinary tract symptoms (LUTS)    IBS (irritable bowel syndrome)    Inguinal hernia without mention of obstruction or gangrene, unilateral or unspecified, (not specified as recurrent)    Insomnia     Lumbar herniated disc    L7   Morbid obesity ([place])    Narcotic addiction ([place])    NICM (nonischemic cardiomyopathy) ([place])    tachycardia mediated,  resolved with sinus   Persistent atrial fibrillation ([place])    ablation done 03/2006 at [facility]   Pneumonia    hx of 2004   PONV (postoperative nausea and vomiting)    Twitching    legs     Social History   Socioeconomic History   Marital status: Married    Spouse name: Alfie Rideaux   Number of children: 3   Years of education: Not on file   Highest education level: Bachelor's degree (e.g., BA, AB, BS)  Occupational History   Occupation: retired  Tobacco Use   Smoking status: Never   Smokeless tobacco: Never  Vaping Use   Vaping Use: Never used  Substance and Sexual Activity   Alcohol use: No    Alcohol/week: 0.0 standard drinks   Drug use: No   Sexual activity: Yes  Other Topics Concern   Not on file  Social History Narrative   Lives in [place] Determinants of Health   Financial Resource Strain: Low Risk    Difficulty of Paying Living Expenses: Not hard at all  Food Insecurity: No Food Insecurity   Worried About Charity fundraiser in the Last Year: Never true   [place] in the Last Year: Never true  Transportation Needs: No Transportation Needs  Lack of Transportation (Medical): No   Lack of Transportation (Non-Medical): No  Physical Activity: Not on file  Stress: Not on file  Social Connections: Not on file    High Risk Criteria for Readmission and/or Poor Patient Outcomes: Heart failure hospital admissions (last 6 months): 1  No Show rate: 34% Difficult social situation: no Demonstrates medication adherence: yes Primary Language: English Literacy level: able to read/write and comprehend.  Education Assessment and Provision:  Detailed education and instructions provided on heart failure disease management including the following:  Signs and symptoms of Heart Failure When to call the  physician Importance of daily weights Low sodium diet Fluid restriction Medication management Anticipated future follow-up appointments  Patient education given on each of the above topics.  Patient acknowledges understanding via teach back method and acceptance of all instructions.  Education Materials:  "Living Better With Heart Failure" Booklet, HF zone tool, & Daily Weight Tracker Tool.  Patient has scale at home: yes Patient has pill box at home: yes   Barriers of Care:   -dietary indiscretion -anxiety  Considerations/Referrals:   Referral made to Heart Failure Pharmacist Stewardship: no Referral made to Heart Failure CSW/NCM TOC: no Referral made to Heart & Vascular TOC clinic: yes, pending DC date  Items for Follow-up on DC/TOC: -optimize -education continued -fluid/sodium restriction   Pricilla Holm, MSN, RN Heart Failure Nurse Navigator [phone]

## 2021-04-18 DIAGNOSIS — Z515 Encounter for palliative care: Secondary | ICD-10-CM

## 2021-04-18 DIAGNOSIS — Z7189 Other specified counseling: Secondary | ICD-10-CM

## 2021-04-18 DIAGNOSIS — G2581 Restless legs syndrome: Secondary | ICD-10-CM

## 2021-04-18 LAB — BASIC METABOLIC PANEL
Anion gap: 7 (ref 5–15)
BUN: 25 mg/dL — ABNORMAL HIGH (ref 8–23)
CO2: 26 mmol/L (ref 22–32)
Calcium: 8.5 mg/dL — ABNORMAL LOW (ref 8.9–10.3)
Chloride: 105 mmol/L (ref 98–111)
Creatinine, Ser: 1.19 mg/dL (ref 0.61–1.24)
GFR, Estimated: >60 mL/min (ref >60)
Glucose, Bld: 128 mg/dL — ABNORMAL HIGH (ref 70–99)
Potassium: 3.7 mmol/L (ref 3.5–5.1)
Sodium: 138 mmol/L (ref 135–145)

## 2021-04-18 LAB — CBC
HCT: 38.5 % — ABNORMAL LOW (ref 39.0–52.0)
Hemoglobin: 12.3 g/dL — ABNORMAL LOW (ref 13.0–17.0)
MCH: 27.6 pg (ref 26.0–34.0)
MCHC: 31.9 g/dL (ref 30.0–36.0)
MCV: 86.5 fL (ref 80.0–100.0)
Platelets: 309 10*3/uL (ref 150–400)
RBC: 4.45 MIL/uL (ref 4.22–5.81)
RDW: 16.5 % — ABNORMAL HIGH (ref 11.5–15.5)
WBC: 17.1 10*3/uL — ABNORMAL HIGH (ref 4.0–10.5)
nRBC: 0 % (ref 0.0–0.2)

## 2021-04-18 LAB — IRON AND TIBC
Iron: 32 ug/dL — ABNORMAL LOW (ref 45–182)
Saturation Ratios: 11 % — ABNORMAL LOW (ref 17.9–39.5)
TIBC: 294 ug/dL (ref 250–450)
UIBC: 262 ug/dL

## 2021-04-18 LAB — MAGNESIUM: Magnesium: 2.1 mg/dL (ref 1.7–2.4)

## 2021-04-18 MED ORDER — TRAMADOL HCL 50 MG PO TABS
50.0000 mg | ORAL_TABLET | Freq: Two times a day (BID) | ORAL | Status: DC | PRN
  Administered 2021-04-18 – 2021-04-19 (×2): 50 mg via ORAL
  Filled 2021-04-18 (×2): qty 1

## 2021-04-18 MED ORDER — FERROUS SULFATE 325 (65 FE) MG PO TABS
325.0000 mg | ORAL_TABLET | Freq: Every day | ORAL | Status: DC
  Administered 2021-04-19 – 2021-04-21 (×3): 325 mg via ORAL
  Filled 2021-04-18 (×4): qty 1

## 2021-04-18 MED ORDER — ROPINIROLE HCL 1 MG PO TABS
3.0000 mg | ORAL_TABLET | Freq: Every day | ORAL | Status: DC
  Administered 2021-04-18 – 2021-04-20 (×3): 3 mg via ORAL
  Filled 2021-04-18 (×5): qty 3

## 2021-04-18 MED ORDER — BIOTENE DRY MOUTH MT LIQD
15.0000 mL | Freq: Two times a day (BID) | OROMUCOSAL | Status: DC
  Administered 2021-04-20 – 2021-04-21 (×3): 15 mL via OROMUCOSAL

## 2021-04-18 MED ORDER — GABAPENTIN 100 MG PO CAPS
200.0000 mg | ORAL_CAPSULE | Freq: Three times a day (TID) | ORAL | Status: DC
  Administered 2021-04-18 – 2021-04-21 (×9): 200 mg via ORAL
  Filled 2021-04-18 (×9): qty 2

## 2021-04-18 MED ORDER — ISOSORB DINITRATE-HYDRALAZINE 20-37.5 MG PO TABS
1.0000 | ORAL_TABLET | Freq: Three times a day (TID) | ORAL | Status: DC
  Administered 2021-04-18 – 2021-04-19 (×6): 1 via ORAL
  Filled 2021-04-18 (×7): qty 1

## 2021-04-18 NOTE — Plan of Care (Signed)

## 2021-04-18 NOTE — Progress Notes (Addendum)
PROGRESS NOTE    [name]  URK:[id number] DOB: [birth year] DOA: 04/16/2021 PCP: [name], MD    Chief Complaint  Patient presents with   Shortness of Breath    Brief Narrative:  [name] is a 65 y.o. male with a hx of obesity, HTN, HLD, permanent atrial fibrillation,  mild to moderate MR, chronic diastolic heart failure, restless leg syndrome presented to ED due to short of breath, weight gain, lower extremity edema, found to be in acute on chronic diastolic CHF exacerbation Also had a fever 100.9 in the ED, currently on antibiotic for presumed UTI, urine culture in process  Subjective: 1.4 L urine output last 24 hours, less short of breath , less swollen  Complaining of generalized pain Refused to wear tele Reports urine was cloudy when coming in, now is clear, there is no fever Wife at bedside   Assessment & Plan:   Principal Problem:   Acute on chronic diastolic CHF (congestive heart failure) (HCC) Active Problems:   Anxiety with depression   Essential hypertension   Persistent atrial fibrillation (HCC)   SOB (shortness of breath)   Sepsis ([place])   Acute lower UTI   Acute on chronic diastolic CHF, mild to moderate mitral regurgitation -Present with [place] difficult volume overload, respiratory distress -Chest x-ray "Cardiac enlargement with mild vascular congestion. No edema or Consolidation" -Report worse started on Demadex a month ago, dose increased 2 weeks ago, however it did not help -Appear diuresing well on IV Lasix, Lasix management per cardiology   Persistent A. Fib With severe left atrial enlargement Rate controlled on Coreg and Cardizem, also on Eliquis Cardiology consulted, will follow recommendation  HTN Currently on Coreg, Cardizem, IV Lasix  Fever 100.9 in the ED UA suggest UTI, he does has leukocytosis Urine culture pending Continue current antibiotics Follow-up on culture result  Restless leg syndrome Significant leg  pain for the last year ,progressively got worse Was referred to neurology however he did not make to the  appointments several times  ambulatory referral to [facility] neurology order placed per patient's request   Patient report Neurontin helped in the past, start low-dose Neurontin. Titrate up, monitor renal function and sedation  Continue Requip, he request to receive daily dose at bedtime , states 1mg  dose at noon does not help him Continue Cymbalta  iron level does show iron deficiency which could cause restless leg, will start iron supplement  Pain allover, increase neurontin, change requip to night time with a higher dose he said it helped , would like to try ultram, ( no allergy to hydrocone per patient's report)  Anxiety Like to keep prn ativan while in the hospital  Class III obesity: Body mass index is 39.33 kg/m.Marland Kitchen Suspect underlying OSA, he does not appear to be interested in sleep study, he does not want CPAP       Unresulted Labs (From admission, onward)     Start     Ordered   04/19/21 0500  CBC with Differential/Platelet  Tomorrow morning,   R       Question:  Specimen collection method  Answer:  Lab=Lab collect   04/18/21 1916   04/19/21 8315  Basic metabolic panel  Daily,   R     Question:  Specimen collection method  Answer:  Lab=Lab collect   04/18/21 1916   04/19/21 0500  Magnesium  Tomorrow morning,   R       Question:  Specimen collection method  Answer:  Lab=Lab collect   04/18/21 1916   04/19/21 0500  Hemoglobin A1c  Tomorrow morning,   R       Question:  Specimen collection method  Answer:  Lab=Lab collect   04/18/21 1917              DVT prophylaxis: SCDs Start: 04/16/21 2326 apixaban (ELIQUIS) tablet 5 mg   Code Status: Full Family Communication: Wife at bedside Disposition:   Status is: Inpatient  Dispo: The patient is from: Home              Anticipated d/c is to: Home              Anticipated d/c date is: Significant volume overload, will  need several days of diuretics, need cardiology clearance                 Consultants:  Cardiology  Procedures:  None  Antimicrobials:   Anti-infectives (From admission, onward)    Start     Dose/Rate Route Frequency Ordered Stop   04/17/21 0045  cefTRIAXone (ROCEPHIN) 1 g in sodium chloride 0.9 % 100 mL IVPB        1 g 200 mL/hr over 30 Minutes Intravenous Every 24 hours 04/16/21 2356             Objective: Vitals:   04/18/21 0630 04/18/21 0846 04/18/21 1112 04/18/21 1629  BP: (!) 140/107 (!) 149/95 (!) 123/92 (!) 101/57  Pulse: 76 (!) 105 69 79  Resp: 18 20 19 19   Temp: 97.6 F (36.4 C) 98.4 F (36.9 C) 98.1 F (36.7 C) 98.5 F (36.9 C)  TempSrc: Oral Oral Oral Oral  SpO2: 93% 94% 94% 92%  Weight: (!) 150.5 kg     Height:        Intake/Output Summary (Last 24 hours) at 04/18/2021 1926 Last data filed at 04/18/2021 1901 Gross per 24 hour  Intake 1785 ml  Output 800 ml  Net 985 ml   Filed Weights   04/16/21 1703 04/17/21 0103 04/18/21 0630  Weight: (!) 143.8 kg (!) 153.6 kg (!) 150.5 kg    Examination:  General exam: alert, awake, communicative, anxious, complain leg pain Respiratory system: Clear to auscultation. Respiratory effort normal. Cardiovascular system:  IRRR.  Gastrointestinal system: Abdomen is nondistended, soft and nontender.  Normal bowel sounds heard. Central nervous system: Alert and oriented. No focal neurological deficits. Extremities:  + edema Skin: No rashes, lesions or ulcers Psychiatry: Anxious    Data Reviewed: I have personally reviewed following labs and imaging studies  CBC: Recent Labs  Lab 04/16/21 1607 04/17/21 0133 04/18/21 0113  WBC 9.8 12.9* 17.1*  NEUTROABS 7.4  --   --   HGB 12.4* 12.9* 12.3*  HCT 37.8* 40.3 38.5*  MCV 85.1 86.1 86.5  PLT 251 269 751    Basic Metabolic Panel: Recent Labs  Lab 04/16/21 1607 04/17/21 0133 04/18/21 0113  NA 140 139 138  K 4.5 3.5 3.7  CL 108 103 105  CO2 23 25  26   GLUCOSE 110* 127* 128*  BUN 19 17 25*  CREATININE 0.94 1.24 1.19  CALCIUM 9.1 8.9 8.5*  MG 2.1 2.0 2.1  PHOS 3.3 3.2  --     GFR: Estimated Creatinine Clearance: 102.2 mL/min (by C-G formula based on SCr of 1.19 mg/dL).  Liver Function Tests: Recent Labs  Lab 04/17/21 0133  AST 31  ALT 34  ALKPHOS 104  BILITOT 0.9  PROT 7.7  ALBUMIN 3.3*  CBG: No results for input(s): GLUCAP in the last 168 hours.   Recent Results (from the past 240 hour(s))  Urine Culture     Status: Abnormal (Preliminary result)   Collection Time: 04/16/21  5:18 AM   Specimen: Urine, Clean Catch  Result Value Ref Range Status   Specimen Description URINE, CLEAN CATCH  Final   Special Requests NONE  Final   Culture (A)  Final    70,000 COLONIES/mL STAPHYLOCOCCUS AUREUS SUSCEPTIBILITIES TO FOLLOW Performed at [facility], [address], [place], [place] [address]    Report Status PENDING  Incomplete  Resp Panel by RT-PCR (Flu A&B, Covid) Nasopharyngeal Swab     Status: None   Collection Time: 04/16/21  7:49 PM   Specimen: Nasopharyngeal Swab; Nasopharyngeal(NP) swabs in vial transport medium  Result Value Ref Range Status   SARS Coronavirus 2 by RT PCR NEGATIVE NEGATIVE Final    Comment: (NOTE) SARS-CoV-2 target nucleic acids are NOT DETECTED.  The SARS-CoV-2 RNA is generally detectable in upper respiratory specimens during the acute phase of infection. The lowest concentration of SARS-CoV-2 viral copies this assay can detect is 138 copies/mL. A negative result does not preclude SARS-Cov-2 infection and should not be used as the sole basis for treatment or other patient management decisions. A negative result may occur with  improper specimen collection/handling, submission of specimen other than nasopharyngeal swab, presence of viral mutation(s) within the areas targeted by this assay, and inadequate number of viral copies(<138 copies/mL). A negative result must be combined  with clinical observations, patient history, and epidemiological information. The expected result is Negative.  Fact Sheet for Patients:  EntrepreneurPulse.com.au  Fact Sheet for Healthcare Providers:  IncredibleEmployment.be  This test is no t yet approved or cleared by the [place] FDA and  has been authorized for detection and/or diagnosis of SARS-CoV-2 by FDA under an Emergency Use Authorization (EUA). This EUA will remain  in effect (meaning this test can be used) for the duration of the COVID-19 declaration under Section 564(b)(1) of the Act, 21 U.S.C.section 360bbb-3(b)(1), unless the authorization is terminated  or revoked sooner.       Influenza A by PCR NEGATIVE NEGATIVE Final   Influenza B by PCR NEGATIVE NEGATIVE Final    Comment: (NOTE) The Xpert Xpress SARS-CoV-2/FLU/RSV plus assay is intended as an aid in the diagnosis of influenza from Nasopharyngeal swab specimens and should not be used as a sole basis for treatment. Nasal washings and aspirates are unacceptable for Xpert Xpress SARS-CoV-2/FLU/RSV testing.  Fact Sheet for Patients: EntrepreneurPulse.com.au  Fact Sheet for Healthcare Providers: IncredibleEmployment.be  This test is not yet approved or cleared by the [place] FDA and has been authorized for detection and/or diagnosis of SARS-CoV-2 by FDA under an Emergency Use Authorization (EUA). This EUA will remain in effect (meaning this test can be used) for the duration of the COVID-19 declaration under Section 564(b)(1) of the Act, 21 U.S.C. section 360bbb-3(b)(1), unless the authorization is terminated or revoked.  Performed at [facility], [address] [place], [place] [address]   Culture, blood (Routine X 2) w Reflex to ID Panel     Status: None (Preliminary result)   Collection Time: 04/17/21  1:33 AM   Specimen: BLOOD  Result Value Ref  Range Status   Specimen Description BLOOD SITE NOT SPECIFIED  Final   Special Requests AEROBIC BOTTLE ONLY Blood Culture adequate volume  Final   Culture   Final    NO  GROWTH 1 DAY Performed at [facility], [place] [address], [place], [place] [address]    Report Status PENDING  Incomplete  Culture, blood (Routine X 2) w Reflex to ID Panel     Status: None (Preliminary result)   Collection Time: 04/17/21  1:33 AM   Specimen: BLOOD  Result Value Ref Range Status   Specimen Description BLOOD SITE NOT SPECIFIED  Final   Special Requests AEROBIC BOTTLE ONLY Blood Culture adequate volume  Final   Culture   Final    NO GROWTH 1 DAY Performed at [facility], [place] [address], [place], [place] [address]    Report Status PENDING  Incomplete         Radiology Studies: DG Chest Port 1 View  Result Date: 04/17/2021 CLINICAL DATA:  Acute respiratory disease EXAM: PORTABLE CHEST 1 VIEW COMPARISON:  04/16/2021 FINDINGS: Shallow inspiration. Cardiac enlargement. Probable central pulmonary vascular congestion. No airspace disease or consolidation. No pleural effusions. No pneumothorax. Mediastinal contours appear intact. IMPRESSION: Cardiac enlargement with mild vascular congestion. No edema or consolidation. Electronically Signed   By: Lucienne Capers M.D.   On: 04/17/2021 03:09        Scheduled Meds:  antiseptic oral rinse  15 mL Mouth Rinse BID   apixaban  5 mg Oral BID   carvedilol  25 mg Oral BID   diltiazem  360 mg Oral Daily   DULoxetine  60 mg Oral Daily   [START ON 04/19/2021] ferrous sulfate  325 mg Oral Q breakfast   furosemide  80 mg Intravenous BID   gabapentin  200 mg Oral TID   influenza vac split quadrivalent PF  0.5 mL Intramuscular Tomorrow-1000   isosorbide-hydrALAZINE  1 tablet Oral TID   melatonin  3 mg Oral QHS   potassium chloride SA  20 mEq Oral Daily   rOPINIRole  3 mg Oral QHS   spironolactone  25 mg Oral Daily   Continuous Infusions:  cefTRIAXone  (ROCEPHIN)  IV 1 g (04/18/21 0021)     LOS: 2 days   Time spent: 47mins Greater than 50% of this time was spent in counseling, explanation of diagnosis, planning of further management, and coordination of care.   Voice Recognition Viviann Spare dictation system was used to create this note, attempts have been made to correct errors. Please contact the author with questions and/or clarifications.   Florencia Reasons, MD PhD FACP [facility]  Available via Epic secure chat 7am-7pm for nonurgent issues Please page for urgent issues To page the attending provider between 7A-7P or the covering provider during after hours 7P-7A, please log into the web site [email or web address] and access using universal [facility] password for that web site. If you do not have the password, please call the hospital operator.    04/18/2021, 7:26 PM

## 2021-04-18 NOTE — Consult Note (Signed)
Consultation Note Date: 04/18/2021   Patient Name: [name]  DOB: [birth year]  MRN: [id number]  Age / Sex: 65 y.o., male  PCP: [name], MD Referring Physician: Florencia Reasons, MD  Reason for Consultation: Establishing goals of care  HPI/Patient Profile: 65 y.o. male  with past medical history of obesity, HTN, HLD, permanent atrial fibrillation,  mild to moderate MR, chronic diastolic heart failure, and restless leg syndrome admitted on 04/16/2021 with acute CHF exacerbation. Also with UTI. PMT consulted to discuss [place].   Clinical Assessment and Goals of Care: I have reviewed medical records including EPIC notes, labs and imaging, assessed the patient and then met with patient  to discuss diagnosis prognosis, GOC, EOL wishes, disposition and options.  Patient c/o of headache - asks that we keep conversation brief. Requested RN bring acetaminophen.  I introduced Palliative Medicine as specialized medical care for people living with serious illness. It focuses on providing relief from the symptoms and stress of a serious illness. The goal is to improve quality of life for both the patient and the family.  We discussed a brief life review of the patient. Patient tells me he lives at home with his wife. Mentions that he has a son.   As far as functional and nutritional status he tells me he has been doing well at home - able to care for himself. Good appetite.    We discussed patient's current illness and what it means in the larger context of patient's on-going co-morbidities. Asked him if we could discuss his heart failure diagnosis further but he shares he understands it well.   I attempted to elicit values and goals of care important to the patient.    Advance directives, concepts specific to code status, artificial feeding and hydration, and rehospitalization were considered and discussed.  I completed a MOST form today. The patient  and family outlined their wishes for the following treatment decisions:  Cardiopulmonary Resuscitation: Attempt Resuscitation (CPR)  Medical Interventions: Full Scope of Treatment: Use intubation, advanced airway interventions, mechanical ventilation, cardioversion as indicated, medical treatment, IV fluids, etc, also provide comfort measures. Transfer to the hospital if indicated  Antibiotics: Antibiotics if indicated  IV Fluids: IV fluids if indicated  Feeding Tube: Feeding tube for a defined trial period   Patient tells me he does have a living will. Also reports he would want his wife to make medical decisions for him if he were unable.   Discussed with patient the importance of continued conversation with family and the medical providers regarding overall plan of care and treatment options, ensuring decisions are within the context of the patient's values and GOCs.    Palliative Care services outpatient were explained and offered. Patient expresses interest in this - will refer to outpatient palliative.  Questions and concerns were addressed. The family was encouraged to call with questions or concerns.    Primary Decision Maker PATIENT Would want wife to make decisions if he were unable  SUMMARY OF RECOMMENDATIONS   - full code/full scope - MOST completed as documented above - patient agreeable to outpatient palliative referral  Code Status/Advance Care Planning: Full code  Symptom Management:  PRN tylenol PRN flexeril Neurontin TID - started yesterday PRN ativan Requip daily and QHS  Discharge Planning: Home with Palliative Services      Primary Diagnoses: Present on Admission:  Acute on chronic diastolic CHF (congestive heart failure) (HCC)  SOB (shortness of breath)  Sepsis ([place])  Acute lower UTI  Essential hypertension  Anxiety with depression  Persistent atrial fibrillation (HCC)   I have reviewed the medical record, interviewed the patient and family,  and examined the patient. The following aspects are pertinent.  Past Medical History:  Diagnosis Date   Allergic rhinitis    Anxiety    Asthma    as a child   Chronic low back pain    Complication of anesthesia    "hard to put asleep, hard to wake up, nausea and vomiting"   Depression    ED (erectile dysfunction)    GERD (gastroesophageal reflux disease)    HLD (hyperlipidemia)    HTN (hypertension)    sees Dr. [name], [facility]   Hypertrophy of prostate with urinary obstruction and other lower urinary tract symptoms (LUTS)    IBS (irritable bowel syndrome)    Inguinal hernia without mention of obstruction or gangrene, unilateral or unspecified, (not specified as recurrent)    Insomnia    Lumbar herniated disc    L7   Morbid obesity ([place])    Narcotic addiction ([place])    NICM (nonischemic cardiomyopathy) ([place])    tachycardia mediated,  resolved with sinus   Persistent atrial fibrillation ([place])    ablation done 03/2006 at [facility]   Pneumonia    hx of 2004   PONV (postoperative nausea and vomiting)    Twitching    legs   Social History   Socioeconomic History   Marital status: Married    Spouse name: Draeden Kellman   Number of children: 3   Years of education: Not on file   Highest education level: Bachelor's degree (e.g., BA, AB, BS)  Occupational History   Occupation: retired  Tobacco Use   Smoking status: Never   Smokeless tobacco: Never  Vaping Use   Vaping Use: Never used  Substance and Sexual Activity   Alcohol use: No    Alcohol/week: 0.0 standard drinks   Drug use: No   Sexual activity: Yes  Other Topics Concern   Not on file  Social History Narrative   Lives in [place] Determinants of Health   Financial Resource Strain: Low Risk    Difficulty of Paying Living Expenses: Not hard at all  Food Insecurity: No Food Insecurity   Worried About Charity fundraiser in the Last Year: Never true   [place] in the Last Year:  Never true  Transportation Needs: No Transportation Needs   Lack of Transportation (Medical): No   Lack of Transportation (Non-Medical): No  Physical Activity: Not on file  Stress: Not on file  Social Connections: Not on file   Family History  Problem Relation Age of Onset   Asthma Mother    Breast cancer Mother    Hypertension Mother    COPD Father    Prostate cancer Father    Hypertension Father    Lymphoma Sister    Scheduled Meds:  apixaban  5 mg Oral BID   carvedilol  25 mg Oral BID   diltiazem  360 mg Oral Daily   DULoxetine  60 mg Oral Daily   furosemide  80 mg Intravenous BID   gabapentin  100 mg Oral TID   influenza vac split quadrivalent PF  0.5 mL Intramuscular Tomorrow-1000   magnesium oxide  400 mg Oral Daily   melatonin  3 mg Oral QHS   potassium chloride SA  20 mEq Oral Daily   rOPINIRole  1 mg Oral Q1400  rOPINIRole  2 mg Oral QHS   spironolactone  25 mg Oral Daily   Continuous Infusions:  cefTRIAXone (ROCEPHIN)  IV 1 g (04/18/21 0021)   PRN Meds:.acetaminophen **OR** acetaminophen, antiseptic oral rinse, cyclobenzaprine, hydrALAZINE, levalbuterol, LORazepam Allergies  Allergen Reactions   Hydrocodone Other (See Comments)    GI upset, dizziness   Review of Systems  Constitutional:  Negative for activity change and appetite change.  Respiratory:  Negative for shortness of breath.   Neurological:  Positive for headaches.   Physical Exam Pulmonary:     Effort: Pulmonary effort is normal.  Skin:    General: Skin is warm and dry.  Neurological:     Mental Status: He is alert and oriented to person, place, and time.    Vital Signs: BP (!) 149/95 (BP Location: Left Arm)   Pulse (!) 105   Temp 98.4 F (36.9 C) (Oral)   Resp 20   Ht 6' 5"  (1.956 m)   Wt (!) 150.5 kg   SpO2 94%   BMI 39.33 kg/m  Pain Scale: 0-10   Pain Score: 0-No pain   SpO2: SpO2: 94 % O2 Device:SpO2: 94 % O2 Flow Rate: .O2 Flow Rate (L/min): 4 L/min  IO:  Intake/output summary:  Intake/Output Summary (Last 24 hours) at 04/18/2021 2263 Last data filed at 04/18/2021 0600 Gross per 24 hour  Intake 837 ml  Output 1000 ml  Net -163 ml    LBM: Last BM Date: 04/16/21 Baseline Weight: Weight: (!) 143.8 kg Most recent weight: Weight: (!) 150.5 kg     Palliative Assessment/Data: PPS 70%    Time Total: 60 minutes Greater than 50%  of this time was spent counseling and coordinating care related to the above assessment and plan.  Juel Burrow, DNP, AGNP-C Palliative Medicine Team [phone] Pager: [phone]

## 2021-04-18 NOTE — Progress Notes (Signed)
Patient has been refusing to wear telemetry monitor. Says he will put it back on shortly when he goes to sleep.

## 2021-04-18 NOTE — Progress Notes (Addendum)
Progress Note  Patient Name: [name] Date of Encounter: 04/18/2021  [facility] Cardiologist: Kirk Ruths, MD   Subjective   Patient is very sleepy during encounter, states he is tired and did not sleep well. He states his breathing is somewhat better. He complaints whole body pain, legs pain, is wondering if he was given gabapentin. He has been urinating. He states he normally weighs around 270 ibs. He states he has no interest for sleep study. He denied chest pain.   Inpatient Medications    Scheduled Meds:  apixaban  5 mg Oral BID   carvedilol  25 mg Oral BID   diltiazem  360 mg Oral Daily   DULoxetine  60 mg Oral Daily   furosemide  80 mg Intravenous BID   gabapentin  100 mg Oral TID   influenza vac split quadrivalent PF  0.5 mL Intramuscular Tomorrow-1000   isosorbide-hydrALAZINE  1 tablet Oral TID   melatonin  3 mg Oral QHS   potassium chloride SA  20 mEq Oral Daily   rOPINIRole  1 mg Oral Q1400   rOPINIRole  2 mg Oral QHS   spironolactone  25 mg Oral Daily   Continuous Infusions:  cefTRIAXone (ROCEPHIN)  IV 1 g (04/18/21 0021)   PRN Meds: acetaminophen **OR** acetaminophen, antiseptic oral rinse, cyclobenzaprine, hydrALAZINE, levalbuterol, LORazepam   Vital Signs    Vitals:   04/17/21 2009 04/18/21 0028 04/18/21 0630 04/18/21 0846  BP: (!) 154/102 (!) 149/108 (!) 140/107 (!) 149/95  Pulse: 67 83 76 (!) 105  Resp: 20 15 18 20   Temp: 97.6 F (36.4 C) (!) 97.4 F (36.3 C) 97.6 F (36.4 C) 98.4 F (36.9 C)  TempSrc: Oral Oral Oral Oral  SpO2: 97% 94% 93% 94%  Weight:   (!) 150.5 kg   Height:        Intake/Output Summary (Last 24 hours) at 04/18/2021 1034 Last data filed at 04/18/2021 1007 Gross per 24 hour  Intake 1073 ml  Output 1000 ml  Net 73 ml   Last 3 Weights 04/18/2021 04/17/2021 04/16/2021  Weight (lbs) 331 lb 11.2 oz 338 lb 10 oz 317 lb  Weight (kg) 150.458 kg 153.6 kg 143.79 kg      Telemetry    A fib with  rate  80-100s- Personally Reviewed  ECG    N/A today - Personally Reviewed  Physical Exam   GEN: No acute distress.  Sleepy, open eyes to voice  Neck: Difficult to assess JVD due to short and thick neck  Cardiac: Irregularly irregular, no murmurs, rubs, or gallops.  Respiratory: Clear to auscultation bilaterally. On room air.  GI: Soft, nontender, non-distended  MS: 1+ BLE edema, mild tenderness to touch  Neuro:  Oriented x3, able to carry conversation and follow commands  Psych: Clinton affect   Labs    High Sensitivity Troponin:  No results for input(s): TROPONINIHS in the last 720 hours.   Chemistry Recent Labs  Lab 04/16/21 1607 04/17/21 0133 04/18/21 0113  NA 140 139 138  K 4.5 3.5 3.7  CL 108 103 105  CO2 23 25 26   GLUCOSE 110* 127* 128*  BUN 19 17 25*  CREATININE 0.94 1.24 1.19  CALCIUM 9.1 8.9 8.5*  MG 2.1 2.0 2.1  PROT  --  7.7  --   ALBUMIN  --  3.3*  --   AST  --  31  --   ALT  --  34  --   ALKPHOS  --  104  --   BILITOT  --  0.9  --   GFRNONAA >60 >60 >60  ANIONGAP 9 11 7     Lipids No results for input(s): CHOL, TRIG, HDL, LABVLDL, LDLCALC, CHOLHDL in the last 168 hours.  Hematology Recent Labs  Lab 04/16/21 1607 04/17/21 0133 04/18/21 0113  WBC 9.8 12.9* 17.1*  RBC 4.44 4.68 4.45  HGB 12.4* 12.9* 12.3*  HCT 37.8* 40.3 38.5*  MCV 85.1 86.1 86.5  MCH 27.9 27.6 27.6  MCHC 32.8 32.0 31.9  RDW 16.1* 16.3* 16.5*  PLT 251 269 309   Thyroid  Recent Labs  Lab 04/16/21 1607  TSH 1.349    BNP Recent Labs  Lab 04/16/21 1607  BNP 334.4*    DDimer No results for input(s): DDIMER in the last 168 hours.   Radiology    DG Chest 2 View  Result Date: 04/16/2021 CLINICAL DATA:  Shortness of breath EXAM: CHEST - 2 VIEW COMPARISON:  Chest x-ray 11/17/2018, CT chest 11/17/2018, chest x-ray 07/15/2004 FINDINGS: The heart and mediastinal contours are unchanged. Biapical pleural/pulmonary scarring. No focal consolidation. No pulmonary edema. No pleural  effusion. No pneumothorax. No acute osseous abnormality. IMPRESSION: No active cardiopulmonary disease. Electronically Signed   By: Iven Finn M.D.   On: 04/16/2021 15:19   DG Chest Port 1 View  Result Date: 04/17/2021 CLINICAL DATA:  Acute respiratory disease EXAM: PORTABLE CHEST 1 VIEW COMPARISON:  04/16/2021 FINDINGS: Shallow inspiration. Cardiac enlargement. Probable central pulmonary vascular congestion. No airspace disease or consolidation. No pleural effusions. No pneumothorax. Mediastinal contours appear intact. IMPRESSION: Cardiac enlargement with mild vascular congestion. No edema or consolidation. Electronically Signed   By: Lucienne Capers M.D.   On: 04/17/2021 03:09    Cardiac Studies   Echo from 04/02/21:   1. Left ventricular ejection fraction, by estimation, is 55 to 60%. The  left ventricle has Clinton function. The left ventricle has no regional  wall motion abnormalities. There is mild concentric left ventricular  hypertrophy. Left ventricular diastolic  parameters are indeterminate.   2. Right ventricular systolic function is Clinton. The right ventricular  size is mildly enlarged.   3. Left atrial size was mildly dilated.   4. The mitral valve is Clinton in structure. Mild to moderate mitral valve  regurgitation.   5. The aortic valve is tricuspid. Aortic valve regurgitation is not  visualized. No aortic stenosis is present.   6. The inferior vena cava is Clinton in size with greater than 50%  respiratory variability, suggesting right atrial pressure of 3 mmHg.   Comparison(s): A prior study was performed on 01/19/2020. Systolic blunting  of the pulmonary vein Doppler signal similar from prior.    Patient Profile     65 y.o. male with PMH of HTN, HLD, permanent A fib s/p ablation/DCCV on Elqiuis, moderate MR, chronic diastolic heart failure, restless leg syndrome, obesity, GERD, who is current admitted for acute on chronic CHF and UTI, cardiology is consulted and  following since 04/17/21.    Assessment & Plan     Acute on chronic diastolic heart failure: - presented with 1 week onset of SOB, weight gain, LE edema , eating fast food frequently  - outpatient torsemide was adjusted 04/10/21 but unfortunately he did not respond at home  - BNP 334 POA - mild vascular congestion on CXR POA - Echo from 04/02/21 as above  - currently started on IV Lasix 80mg  BID, UOP 1458ml over the past 24 hours and Net -3.4L so  far, weight id down from 338 to 331 ibs - continue track daily weight, I&O, renal index electrolytes  - GDMT: continue home Coreg 25mg  BID, added spironolactone 25 mg daily, will add Bidil 20/37.5mg  TID today given persistent elevated BP /afterload reduction - discussed low Na diet, fluid limitation, lifestyle modification   Permanent atrial fibrillation:  - Long history, status post unsuccessful ablation and multiple cardioversions, on amiodarone at one point, Dr. Roxy Manns evaluated patient for consideration of surgical maze procedure and felt that medical therapy was best option, now aiming for rate control  -- Continue rate control with Coreg 25 mg twice daily, diltiazem 360 mg daily -- Continue anticoagulation with Eliquis 5 mg twice daily   Hypertension - BP elevated, continue coreg, diltiazem, spironolactone, adding Bidil today    Mild to moderate MR - routine Echo for monitor outpatient    Suspected OSA - plan for outpatient evaluation    UTI  Generalized anxiety disorder Obesity GERD - managed per IM    For questions or updates, please contact [facility] Please consult [email or web address] for contact info under        Signed, Margie Billet, NP  04/18/2021, 10:34 AM    I have seen and examined the patient along with Margie Billet, NP .  I have reviewed the chart, notes and new data.  I agree with PA/NP's note.  Key new complaints: leg pain remains the biggest complaint. Receiving Requip, cymbalta and gabapentin Key examination changes:  JVD +, clear lungs, mild ankle edema. Weight down 7 lb. Key new findings / data: creatinine 1.2, unchanged from yesterday  PLAN: Continue IV diuretics. Still need to diurese at least 30 lb, maybe actually 50 lb, based on previously documented weights in 280-290 range. Unclear whether his home dose of carvedilol was 25 mg or 50 mg BID. Bidil added since BP remains quite high.  Sanda Klein, MD, [place] [phone] 04/18/2021, 10:45 AM

## 2021-04-18 NOTE — Progress Notes (Signed)
Patient continues to refuse to wear telemetry monitor.

## 2021-04-19 LAB — CBC WITH DIFFERENTIAL/PLATELET
Abs Immature Granulocytes: 0.17 10*3/uL — ABNORMAL HIGH (ref 0.00–0.07)
Basophils Absolute: 0.1 10*3/uL (ref 0.0–0.1)
Basophils Relative: 1 %
Eosinophils Absolute: 0.2 10*3/uL (ref 0.0–0.5)
Eosinophils Relative: 2 %
HCT: 40.8 % (ref 39.0–52.0)
Hemoglobin: 13 g/dL (ref 13.0–17.0)
Immature Granulocytes: 1 %
Lymphocytes Relative: 23 %
Lymphs Abs: 2.7 10*3/uL (ref 0.7–4.0)
MCH: 28 pg (ref 26.0–34.0)
MCHC: 31.9 g/dL (ref 30.0–36.0)
MCV: 87.9 fL (ref 80.0–100.0)
Monocytes Absolute: 1.4 10*3/uL — ABNORMAL HIGH (ref 0.1–1.0)
Monocytes Relative: 12 %
Neutro Abs: 7.4 10*3/uL (ref 1.7–7.7)
Neutrophils Relative %: 61 %
Platelets: 330 10*3/uL (ref 150–400)
RBC: 4.64 MIL/uL (ref 4.22–5.81)
RDW: 17.1 % — ABNORMAL HIGH (ref 11.5–15.5)
WBC: 12 10*3/uL — ABNORMAL HIGH (ref 4.0–10.5)
nRBC: 0 % (ref 0.0–0.2)

## 2021-04-19 LAB — BASIC METABOLIC PANEL
Anion gap: 9 (ref 5–15)
BUN: 26 mg/dL — ABNORMAL HIGH (ref 8–23)
CO2: 27 mmol/L (ref 22–32)
Calcium: 8.4 mg/dL — ABNORMAL LOW (ref 8.9–10.3)
Chloride: 102 mmol/L (ref 98–111)
Creatinine, Ser: 1.29 mg/dL — ABNORMAL HIGH (ref 0.61–1.24)
GFR, Estimated: >60 mL/min (ref >60)
Glucose, Bld: 121 mg/dL — ABNORMAL HIGH (ref 70–99)
Potassium: 3.7 mmol/L (ref 3.5–5.1)
Sodium: 138 mmol/L (ref 135–145)

## 2021-04-19 LAB — URINE CULTURE: Culture: 70000 — AB

## 2021-04-19 LAB — MAGNESIUM: Magnesium: 2.2 mg/dL (ref 1.7–2.4)

## 2021-04-19 LAB — HEMOGLOBIN A1C
Hgb A1c MFr Bld: 6.5 % — ABNORMAL HIGH (ref 4.8–5.6)
Mean Plasma Glucose: 139.85 mg/dL

## 2021-04-19 MED ORDER — CEPHALEXIN 250 MG PO CAPS
500.0000 mg | ORAL_CAPSULE | Freq: Two times a day (BID) | ORAL | Status: DC
  Administered 2021-04-19 – 2021-04-21 (×4): 500 mg via ORAL
  Filled 2021-04-19 (×4): qty 2

## 2021-04-19 MED ORDER — SODIUM CHLORIDE 0.9 % IV SOLN
INTRAVENOUS | Status: DC | PRN
  Administered 2021-04-19: 1000 mL via INTRAVENOUS

## 2021-04-19 MED ORDER — DIPHENHYDRAMINE HCL 25 MG PO CAPS
25.0000 mg | ORAL_CAPSULE | Freq: Once | ORAL | Status: AC | PRN
  Administered 2021-04-19: 25 mg via ORAL
  Filled 2021-04-19: qty 1

## 2021-04-19 MED ORDER — METOLAZONE 5 MG PO TABS
5.0000 mg | ORAL_TABLET | Freq: Once | ORAL | Status: AC
  Administered 2021-04-19: 5 mg via ORAL
  Filled 2021-04-19: qty 1

## 2021-04-19 NOTE — Progress Notes (Signed)
[place] [id number] Manufacturing engineer [facility]) Hospital Liaison note:  Notified by Kathie Rhodes, NP of request for [place] services. Will continue to follow for disposition.  Please call with any outpatient palliative questions or concerns.  Thank you for the opportunity to participate in this patient's care.  Thank you, Lorelee Market, LPN [facility] Liaison [phone]

## 2021-04-19 NOTE — Progress Notes (Signed)
Progress Note  Patient Name: [name] Date of Encounter: 04/19/2021  [facility] Cardiologist: Kirk Ruths, MD   Subjective   Overall better.  Still very edematous. Unfortunately, large part of urine output yesterday was not recorded since he urinated in the bathroom without measuring it.  Weight shows no change since yesterday, but he did notice a reduction in urine output. Now was able to comfortably lie flat in bed without dyspnea.  Biggest complaint remains leg pain and restless legs.  Inpatient Medications    Scheduled Meds:  antiseptic oral rinse  15 mL Mouth Rinse BID   apixaban  5 mg Oral BID   carvedilol  25 mg Oral BID   diltiazem  360 mg Oral Daily   DULoxetine  60 mg Oral Daily   ferrous sulfate  325 mg Oral Q breakfast   furosemide  80 mg Intravenous BID   gabapentin  200 mg Oral TID   influenza vac split quadrivalent PF  0.5 mL Intramuscular Tomorrow-1000   isosorbide-hydrALAZINE  1 tablet Oral TID   melatonin  3 mg Oral QHS   potassium chloride SA  20 mEq Oral Daily   rOPINIRole  3 mg Oral QHS   spironolactone  25 mg Oral Daily   Continuous Infusions:  sodium chloride 10 mL/hr at 04/19/21 0310   cefTRIAXone (ROCEPHIN)  IV Stopped (04/19/21 0129)   PRN Meds: sodium chloride, acetaminophen **OR** acetaminophen, cyclobenzaprine, hydrALAZINE, levalbuterol, LORazepam, traMADol   Vital Signs    Vitals:   04/18/21 1112 04/18/21 1629 04/18/21 1957 04/19/21 0340  BP: (!) 123/92 (!) 101/57 112/87 139/85  Pulse: 69 79 72 79  Resp: 19 19 19 19   Temp: 98.1 F (36.7 C) 98.5 F (36.9 C) 97.9 F (36.6 C) 97.7 F (36.5 C)  TempSrc: Oral Oral Oral Oral  SpO2: 94% 92% 97% 93%  Weight:    (!) 150.5 kg  Height:        Intake/Output Summary (Last 24 hours) at 04/19/2021 0834 Last data filed at 04/19/2021 0400 Gross per 24 hour  Intake 2359.01 ml  Output 1450 ml  Net 909.01 ml   Last 3 Weights 04/19/2021 04/18/2021 04/17/2021  Weight (lbs)  331 lb 12.7 oz 331 lb 11.2 oz 338 lb 10 oz  Weight (kg) 150.5 kg 150.458 kg 153.6 kg      Telemetry    Atrial fibrillation.  Refuses to wear telemetry monitor for large parts of the day, but while on telemetry, rate is consistently well controlled.- Personally Reviewed  ECG    No new tracing- Personally Reviewed  Physical Exam  Severely obese GEN: No acute distress.   Neck: No JVD Cardiac: Irregular, no murmurs, rubs, or gallops.  Respiratory: Clear to auscultation bilaterally. GI: Soft, nontender, non-distended  MS: 2-3+ symmetrical ankle and pedal pitting edema; No deformity. Neuro:  Nonfocal  Psych: Normal affect   Labs    High Sensitivity Troponin:  No results for input(s): TROPONINIHS in the last 720 hours.   Chemistry Recent Labs  Lab 04/17/21 0133 04/18/21 0113 04/19/21 0352  NA 139 138 138  K 3.5 3.7 3.7  CL 103 105 102  CO2 25 26 27   GLUCOSE 127* 128* 121*  BUN 17 25* 26*  CREATININE 1.24 1.19 1.29*  CALCIUM 8.9 8.5* 8.4*  MG 2.0 2.1 2.2  PROT 7.7  --   --   ALBUMIN 3.3*  --   --   AST 31  --   --   ALT  34  --   --   ALKPHOS 104  --   --   BILITOT 0.9  --   --   GFRNONAA >60 >60 >60  ANIONGAP 11 7 9     Lipids No results for input(s): CHOL, TRIG, HDL, LABVLDL, LDLCALC, CHOLHDL in the last 168 hours.  Hematology Recent Labs  Lab 04/17/21 0133 04/18/21 0113 04/19/21 0352  WBC 12.9* 17.1* 12.0*  RBC 4.68 4.45 4.64  HGB 12.9* 12.3* 13.0  HCT 40.3 38.5* 40.8  MCV 86.1 86.5 87.9  MCH 27.6 27.6 28.0  MCHC 32.0 31.9 31.9  RDW 16.3* 16.5* 17.1*  PLT 269 309 330   Thyroid  Recent Labs  Lab 04/16/21 1607  TSH 1.349    BNP Recent Labs  Lab 04/16/21 1607  BNP 334.4*    DDimer No results for input(s): DDIMER in the last 168 hours.   Radiology    No results found.  Cardiac Studies   Echo from 04/02/21:    1. Left ventricular ejection fraction, by estimation, is 55 to 60%. The  left ventricle has normal function. The left ventricle has  no regional  wall motion abnormalities. There is mild concentric left ventricular  hypertrophy. Left ventricular diastolic  parameters are indeterminate.   2. Right ventricular systolic function is normal. The right ventricular  size is mildly enlarged.   3. Left atrial size was mildly dilated.   4. The mitral valve is normal in structure. Mild to moderate mitral valve  regurgitation.   5. The aortic valve is tricuspid. Aortic valve regurgitation is not  visualized. No aortic stenosis is present.   6. The inferior vena cava is normal in size with greater than 50%  respiratory variability, suggesting right atrial pressure of 3 mmHg.   Comparison(s): A prior study was performed on 01/19/2020. Systolic blunting  of the pulmonary vein Doppler signal similar from prior.     Patient Profile     65 y.o. male with HTN, HLD, permanent A fib (s/p remote ablation/DCCV on Elqiuis), moderate MR, chronic diastolic heart failure, restless leg syndrome, very strong suspicion for obstructive sleep apnea but refusing evaluation and CPAP, severe obesity, GERD, who is current admitted for acute on chronic CHF and UTI, cardiology is consulted and following since 04/17/21.    Assessment & Plan    CHF: Clinically he seems to be improving.  He has clinical evidence of biventricular failure, with dominant features of right heart failure was still markedly edematous.  If today's weight is accurate he is still roughly 40 pounds above his target "dry weight".  Continue intravenous diuretics, will add a one-time dose of metolazone.  Roughly a year ago his weight was hovering in the 280-290 lb range and 2 years ago his BNP was normal at 35.  BNP on this admission over 330.  Only a minimal increase in BUN/creatinine during treatment with diuretics so far.  Continue to educate about sodium content of restaurant fare, many processed foods and ingredients. AFib: Had sustained benefit from ablation for a few years, but since  then has failed multiple cardioversions and is now considered to have permanent atrial fibrillation.  Left atrial diameter is 6.5 cm.  On chronic anticoagulation.  Rate is well controlled on a combination of diltiazem and carvedilol. MR: Described as mild to moderate on report, but probably at least moderate my review of echo.  The jet is central. HTN: Blood pressure was markedly elevated on admission so in addition to spironolactone, diltiazem and  carvedilol, he has been started on BiDil.  Currently with good blood pressure control. UTI: Febrile on admission, now afebrile and WBC has improved.  Urine culture is growing an intermediate number of S.aureus colonies.  Unsure if this is the true pathogen. Obesity/OSA: Likely underlies his right heart failure and is highly deleterious to his overall cardiovascular status, but he remains opposed to sleep study or use of CPAP.     For questions or updates, please contact [place] Please consult [email or web address] for contact info under        Signed, Sanda Klein, MD  04/19/2021, 8:34 AM

## 2021-04-19 NOTE — Progress Notes (Signed)
PROGRESS NOTE    [name]  KCL:[id number] DOB: April 01, 1956 DOA: 04/16/2021 PCP: [name], MD    Chief Complaint  Patient presents with   Shortness of Breath    Brief Narrative:  [name] is a 65 y.o. male with a hx of obesity, HTN, HLD, permanent atrial fibrillation,  mild to moderate MR, chronic diastolic heart failure, restless leg syndrome presented to ED due to short of breath, weight gain, lower extremity edema, found to be in acute on chronic diastolic CHF exacerbation Also had a fever 100.9 in the ED, currently on antibiotic for presumed UTI, urine culture in process  Subjective:   less short of breath , less swollen , reports wheezing resolved, Overall feeling better today RN report he Refused to wear tele daytime but agrees to this at night  Cr start to trend up  Assessment & Plan:   Principal Problem:   Acute on chronic diastolic CHF (congestive heart failure) (HCC) Active Problems:   Anxiety with depression   Essential hypertension   Persistent atrial fibrillation (HCC)   SOB (shortness of breath)   Sepsis ([place])   Acute lower UTI   Acute on chronic diastolic CHF, mild to moderate mitral regurgitation -Present with significant volume overload, respiratory distress -Chest x-ray "Cardiac enlargement with mild vascular congestion. No edema or Consolidation" -Report was started on Demadex a month ago, dose increased 2 weeks ago, however it did not help - diuresing well on IV Lasix, spironolactone , received 1 dose metolazone today on 10/14 -management per cardiology, appreciate input   Persistent A. Fib With severe left atrial enlargement Rate controlled on Coreg and Cardizem, also on Eliquis Cardiology consulted, will follow recommendation  HTN Currently on Coreg, Cardizem, IV Lasix, spironolactone  UTI with Fever 100.9 in the ED, leukocytosis Urine culture + staph Reports urine was cloudy when coming in, now is clear, fever  resolved with rocephin Change to keflex per culture /sensitivity result, plan total of 5 days abx treatment, last dose on 10/16 Will discuss with him about Flomax   Restless leg syndrome Significant leg pain for the last year ,progressively got worse Was referred to neurology however he did not make to the  appointments several times  ambulatory referral to [facility] neurology order placed per patient's request   Patient report Neurontin helped in the past, start low-dose Neurontin. Titrate up, monitor renal function and sedation  Continue Requip, he requested to receive daily dose at bedtime , states 1mg  dose at noon does not help him, change requip to night time with a higher dose Continue Cymbalta  iron level does show iron deficiency which could cause restless leg, will start iron supplement   Anxiety Like to keep prn ativan while in the hospital  Elevated a1c at 6.5 , a.m. fasting blood glucose about 126 intermittently Prediabetes /borderline diabetes Discussed diet, exercise, weight loss strategies  Class III obesity: Body mass index is 39.34 kg/m.Marland Kitchen Suspect underlying OSA, he does not appear to be interested in sleep study, he does not want CPAP       Unresulted Labs (From admission, onward)     Start     Ordered   04/20/21 0500  CBC with Differential/Platelet  Tomorrow morning,   R       Question:  Specimen collection method  Answer:  Lab=Lab collect   04/19/21 1643   04/20/21 0500  Magnesium  Tomorrow morning,   STAT       Question:  Specimen collection  method  Answer:  Lab=Lab collect   04/19/21 1643   04/19/21 2831  Basic metabolic panel  Daily,   R     Question:  Specimen collection method  Answer:  Lab=Lab collect   04/18/21 1916              DVT prophylaxis: SCDs Start: 04/16/21 2326 apixaban (ELIQUIS) tablet 5 mg   Code Status: Full Family Communication: Wife at bedside on 10/12, 10/13 Disposition:   Status is: Inpatient  Dispo: The patient is from:  Home              Anticipated d/c is to: Home              Anticipated d/c date is: Possible over the weekend, need cardiology clearance                 Consultants:  Cardiology  Procedures:  None  Antimicrobials:   Anti-infectives (From admission, onward)    Start     Dose/Rate Route Frequency Ordered Stop   04/19/21 2200  cephALEXin (KEFLEX) capsule 500 mg        500 mg Oral Every 12 hours 04/19/21 1622     04/17/21 0045  cefTRIAXone (ROCEPHIN) 1 g in sodium chloride 0.9 % 100 mL IVPB  Status:  Discontinued        1 g 200 mL/hr over 30 Minutes Intravenous Every 24 hours 04/16/21 2356 04/19/21 1622           Objective: Vitals:   04/18/21 1629 04/18/21 1957 04/19/21 0340 04/19/21 1108  BP: (!) 101/57 112/87 139/85 115/83  Pulse: 79 72 79 95  Resp: 19 19 19 19   Temp: 98.5 F (36.9 C) 97.9 F (36.6 C) 97.7 F (36.5 C) (!) 97.5 F (36.4 C)  TempSrc: Oral Oral Oral Oral  SpO2: 92% 97% 93% 97%  Weight:   (!) 150.5 kg   Height:        Intake/Output Summary (Last 24 hours) at 04/19/2021 1651 Last data filed at 04/19/2021 1532 Gross per 24 hour  Intake 1767.01 ml  Output 4578 ml  Net -2810.99 ml   Filed Weights   04/17/21 0103 04/18/21 0630 04/19/21 0340  Weight: (!) 153.6 kg (!) 150.5 kg (!) 150.5 kg    Examination:  General exam: alert, awake, communicative, less anxious, he is very happy today Respiratory system: Clear to auscultation. Respiratory effort normal. Cardiovascular system:  IRRR.  Gastrointestinal system: Abdomen is nondistended, soft and nontender.  Normal bowel sounds heard. Central nervous system: Alert and oriented. No focal neurological deficits. Extremities:  less edema Skin: No rashes, lesions or ulcers Psychiatry: Pleasant, cooperative    Data Reviewed: I have personally reviewed following labs and imaging studies  CBC: Recent Labs  Lab 04/16/21 1607 04/17/21 0133 04/18/21 0113 04/19/21 0352  WBC 9.8 12.9* 17.1* 12.0*   NEUTROABS 7.4  --   --  7.4  HGB 12.4* 12.9* 12.3* 13.0  HCT 37.8* 40.3 38.5* 40.8  MCV 85.1 86.1 86.5 87.9  PLT 251 269 309 517    Basic Metabolic Panel: Recent Labs  Lab 04/16/21 1607 04/17/21 0133 04/18/21 0113 04/19/21 0352  NA 140 139 138 138  K 4.5 3.5 3.7 3.7  CL 108 103 105 102  CO2 23 25 26 27   GLUCOSE 110* 127* 128* 121*  BUN 19 17 25* 26*  CREATININE 0.94 1.24 1.19 1.29*  CALCIUM 9.1 8.9 8.5* 8.4*  MG 2.1 2.0 2.1 2.2  PHOS 3.3  3.2  --   --     GFR: Estimated Creatinine Clearance: 94.3 mL/min (A) (by C-G formula based on SCr of 1.29 mg/dL (H)).  Liver Function Tests: Recent Labs  Lab 04/17/21 0133  AST 31  ALT 34  ALKPHOS 104  BILITOT 0.9  PROT 7.7  ALBUMIN 3.3*    CBG: No results for input(s): GLUCAP in the last 168 hours.   Recent Results (from the past 240 hour(s))  Urine Culture     Status: Abnormal   Collection Time: 04/16/21  5:18 AM   Specimen: Urine, Clean Catch  Result Value Ref Range Status   Specimen Description URINE, CLEAN CATCH  Final   Special Requests   Final    NONE Performed at [facility], [address] [place], [place] [address]    Culture 70,000 COLONIES/mL STAPHYLOCOCCUS AUREUS (A)  Final   Report Status 04/19/2021 FINAL  Final   Organism ID, Bacteria STAPHYLOCOCCUS AUREUS (A)  Final      Susceptibility   Staphylococcus aureus - MIC*    CIPROFLOXACIN <=0.5 SENSITIVE Sensitive     GENTAMICIN <=0.5 SENSITIVE Sensitive     NITROFURANTOIN 32 SENSITIVE Sensitive     OXACILLIN <=0.25 SENSITIVE Sensitive     TETRACYCLINE <=1 SENSITIVE Sensitive     VANCOMYCIN 1 SENSITIVE Sensitive     TRIMETH/SULFA <=10 SENSITIVE Sensitive     CLINDAMYCIN RESISTANT Resistant     RIFAMPIN <=0.5 SENSITIVE Sensitive     Inducible Clindamycin POSITIVE Resistant     * 70,000 COLONIES/mL STAPHYLOCOCCUS AUREUS  Resp Panel by RT-PCR (Flu A&B, Covid) Nasopharyngeal Swab     Status: None   Collection Time: 04/16/21  7:49 PM   Specimen:  Nasopharyngeal Swab; Nasopharyngeal(NP) swabs in vial transport medium  Result Value Ref Range Status   SARS Coronavirus 2 by RT PCR NEGATIVE NEGATIVE Final    Comment: (NOTE) SARS-CoV-2 target nucleic acids are NOT DETECTED.  The SARS-CoV-2 RNA is generally detectable in upper respiratory specimens during the acute phase of infection. The lowest concentration of SARS-CoV-2 viral copies this assay can detect is 138 copies/mL. A negative result does not preclude SARS-Cov-2 infection and should not be used as the sole basis for treatment or other patient management decisions. A negative result may occur with  improper specimen collection/handling, submission of specimen other than nasopharyngeal swab, presence of viral mutation(s) within the areas targeted by this assay, and inadequate number of viral copies(<138 copies/mL). A negative result must be combined with clinical observations, patient history, and epidemiological information. The expected result is Negative.  Fact Sheet for Patients:  EntrepreneurPulse.com.au  Fact Sheet for Healthcare Providers:  IncredibleEmployment.be  This test is no t yet approved or cleared by the [place] FDA and  has been authorized for detection and/or diagnosis of SARS-CoV-2 by FDA under an Emergency Use Authorization (EUA). This EUA will remain  in effect (meaning this test can be used) for the duration of the COVID-19 declaration under Section 564(b)(1) of the Act, 21 U.S.C.section 360bbb-3(b)(1), unless the authorization is terminated  or revoked sooner.       Influenza A by PCR NEGATIVE NEGATIVE Final   Influenza B by PCR NEGATIVE NEGATIVE Final    Comment: (NOTE) The Xpert Xpress SARS-CoV-2/FLU/RSV plus assay is intended as an aid in the diagnosis of influenza from Nasopharyngeal swab specimens and should not be used as a sole basis for treatment. Nasal washings and aspirates are unacceptable for  Xpert Xpress SARS-CoV-2/FLU/RSV testing.  Fact Sheet for Patients: EntrepreneurPulse.com.au  Fact Sheet for Healthcare Providers: IncredibleEmployment.be  This test is not yet approved or cleared by the [place] FDA and has been authorized for detection and/or diagnosis of SARS-CoV-2 by FDA under an Emergency Use Authorization (EUA). This EUA will remain in effect (meaning this test can be used) for the duration of the COVID-19 declaration under Section 564(b)(1) of the Act, 21 U.S.C. section 360bbb-3(b)(1), unless the authorization is terminated or revoked.  Performed at [facility], [address], [place], [place] [address]   Culture, blood (Routine X 2) w Reflex to ID Panel     Status: None (Preliminary result)   Collection Time: 04/17/21  1:33 AM   Specimen: BLOOD  Result Value Ref Range Status   Specimen Description BLOOD SITE NOT SPECIFIED  Final   Special Requests AEROBIC BOTTLE ONLY Blood Culture adequate volume  Final   Culture   Final    NO GROWTH 2 DAYS Performed at [facility], [place] [address] [place], [place] [address]    Report Status PENDING  Incomplete  Culture, blood (Routine X 2) w Reflex to ID Panel     Status: None (Preliminary result)   Collection Time: 04/17/21  1:33 AM   Specimen: BLOOD  Result Value Ref Range Status   Specimen Description BLOOD SITE NOT SPECIFIED  Final   Special Requests AEROBIC BOTTLE ONLY Blood Culture adequate volume  Final   Culture   Final    NO GROWTH 2 DAYS Performed at [facility], [place] [address] [place], [place] [address]    Report Status PENDING  Incomplete         Radiology Studies: No results found.      Scheduled Meds:  antiseptic oral rinse  15 mL Mouth Rinse BID   apixaban  5 mg Oral BID   carvedilol  25 mg Oral BID   cephALEXin  500 mg Oral Q12H   diltiazem  360 mg Oral Daily   DULoxetine  60 mg Oral Daily   ferrous  sulfate  325 mg Oral Q breakfast   furosemide  80 mg Intravenous BID   gabapentin  200 mg Oral TID   influenza vac split quadrivalent PF  0.5 mL Intramuscular Tomorrow-1000   isosorbide-hydrALAZINE  1 tablet Oral TID   melatonin  3 mg Oral QHS   potassium chloride SA  20 mEq Oral Daily   rOPINIRole  3 mg Oral QHS   spironolactone  25 mg Oral Daily   Continuous Infusions:     LOS: 3 days   Time spent: 46mins Greater than 50% of this time was spent in counseling, explanation of diagnosis, planning of further management, and coordination of care.   Voice Recognition Viviann Spare dictation system was used to create this note, attempts have been made to correct errors. Please contact the author with questions and/or clarifications.   Florencia Reasons, MD PhD FACP [facility]  Available via Epic secure chat 7am-7pm for nonurgent issues Please page for urgent issues To page the attending provider between 7A-7P or the covering provider during after hours 7P-7A, please log into the web site [email or web address] and access using universal [place] password for that web site. If you do not have the password, please call the hospital operator.    04/19/2021, 4:51 PM

## 2021-04-19 NOTE — Plan of Care (Signed)
  Problem: Elimination: Goal: Will not experience complications related to urinary retention Outcome: Progressing   Problem: Pain Managment: Goal: General experience of comfort will improve Outcome: Progressing   

## 2021-04-19 NOTE — Plan of Care (Signed)

## 2021-04-19 NOTE — TOC Progression Note (Signed)
Transition of Care [facility]) - Progression Note    Patient Details  Name: [name] MRN: [id number] Date of Birth: 20-Apr-1956  [facility] CM/SW Contact  Zenon Mayo, RN Phone Number: 04/19/2021, 8:53 PM  Clinical Narrative:    from home, CHF, conts on iv lasix, is active with [facility] for outpatient palliative services. TOC will continue to follow for dc needs.        Expected Discharge Plan and Services                                                 Social Determinants of Health (SDOH) Interventions Food Insecurity Interventions: Intervention Not Indicated Financial Strain Interventions: Intervention Not Indicated Housing Interventions: Intervention Not Indicated Transportation Interventions: Intervention Not Indicated  Readmission Risk Interventions No flowsheet data found.

## 2021-04-20 LAB — BASIC METABOLIC PANEL
Anion gap: 9 (ref 5–15)
BUN: 29 mg/dL — ABNORMAL HIGH (ref 8–23)
CO2: 30 mmol/L (ref 22–32)
Calcium: 9.1 mg/dL (ref 8.9–10.3)
Chloride: 96 mmol/L — ABNORMAL LOW (ref 98–111)
Creatinine, Ser: 1.32 mg/dL — ABNORMAL HIGH (ref 0.61–1.24)
GFR, Estimated: >60 mL/min (ref >60)
Glucose, Bld: 125 mg/dL — ABNORMAL HIGH (ref 70–99)
Potassium: 3.6 mmol/L (ref 3.5–5.1)
Sodium: 135 mmol/L (ref 135–145)

## 2021-04-20 LAB — CBC WITH DIFFERENTIAL/PLATELET
Abs Immature Granulocytes: 0.37 10*3/uL — ABNORMAL HIGH (ref 0.00–0.07)
Basophils Absolute: 0.1 10*3/uL (ref 0.0–0.1)
Basophils Relative: 1 %
Eosinophils Absolute: 0.3 10*3/uL (ref 0.0–0.5)
Eosinophils Relative: 2 %
HCT: 43.1 % (ref 39.0–52.0)
Hemoglobin: 13.4 g/dL (ref 13.0–17.0)
Immature Granulocytes: 3 %
Lymphocytes Relative: 18 %
Lymphs Abs: 2.3 10*3/uL (ref 0.7–4.0)
MCH: 27.2 pg (ref 26.0–34.0)
MCHC: 31.1 g/dL (ref 30.0–36.0)
MCV: 87.6 fL (ref 80.0–100.0)
Monocytes Absolute: 1.7 10*3/uL — ABNORMAL HIGH (ref 0.1–1.0)
Monocytes Relative: 13 %
Neutro Abs: 8.3 10*3/uL — ABNORMAL HIGH (ref 1.7–7.7)
Neutrophils Relative %: 63 %
Platelets: 343 10*3/uL (ref 150–400)
RBC: 4.92 MIL/uL (ref 4.22–5.81)
RDW: 16.8 % — ABNORMAL HIGH (ref 11.5–15.5)
WBC: 13 10*3/uL — ABNORMAL HIGH (ref 4.0–10.5)
nRBC: 0 % (ref 0.0–0.2)

## 2021-04-20 LAB — MAGNESIUM: Magnesium: 2.1 mg/dL (ref 1.7–2.4)

## 2021-04-20 MED ORDER — TORSEMIDE 20 MG PO TABS
20.0000 mg | ORAL_TABLET | Freq: Every day | ORAL | Status: DC
  Administered 2021-04-20 – 2021-04-21 (×2): 20 mg via ORAL
  Filled 2021-04-20 (×2): qty 1

## 2021-04-20 MED ORDER — POTASSIUM CHLORIDE CRYS ER 20 MEQ PO TBCR: 40.0000 meq | EXTENDED_RELEASE_TABLET | Freq: Once | ORAL | Status: DC

## 2021-04-20 MED ORDER — DIPHENHYDRAMINE HCL 25 MG PO CAPS
25.0000 mg | ORAL_CAPSULE | Freq: Once | ORAL | Status: AC | PRN
  Administered 2021-04-20: 25 mg via ORAL
  Filled 2021-04-20: qty 1

## 2021-04-20 MED ORDER — LOSARTAN POTASSIUM 50 MG PO TABS
50.0000 mg | ORAL_TABLET | Freq: Every day | ORAL | Status: DC
  Administered 2021-04-20 – 2021-04-21 (×2): 50 mg via ORAL
  Filled 2021-04-20 (×2): qty 1

## 2021-04-20 MED ORDER — POTASSIUM CHLORIDE CRYS ER 20 MEQ PO TBCR
20.0000 meq | EXTENDED_RELEASE_TABLET | Freq: Once | ORAL | Status: AC
  Administered 2021-04-20: 20 meq via ORAL
  Filled 2021-04-20: qty 1

## 2021-04-20 NOTE — Progress Notes (Signed)
Cardiology Progress Note  Patient ID: [name] MRN: [id number] DOB: 11-30-1955 Date of Encounter: 04/20/2021  Primary Cardiologist: Kirk Ruths, MD  Subjective   Chief Complaint: Headache  HPI: Good diuresis.  Euvolemic.  Trace edema in the right lower extremity.  He reports trauma to that area in the past.  ROS:  All other ROS reviewed and negative. Pertinent positives noted in the HPI.     Inpatient Medications  Scheduled Meds:  antiseptic oral rinse  15 mL Mouth Rinse BID   apixaban  5 mg Oral BID   carvedilol  25 mg Oral BID   cephALEXin  500 mg Oral Q12H   diltiazem  360 mg Oral Daily   DULoxetine  60 mg Oral Daily   ferrous sulfate  325 mg Oral Q breakfast   gabapentin  200 mg Oral TID   influenza vac split quadrivalent PF  0.5 mL Intramuscular Tomorrow-1000   losartan  50 mg Oral Daily   melatonin  3 mg Oral QHS   potassium chloride SA  20 mEq Oral Daily   rOPINIRole  3 mg Oral QHS   spironolactone  25 mg Oral Daily   torsemide  20 mg Oral Daily   Continuous Infusions:  PRN Meds: acetaminophen **OR** acetaminophen, cyclobenzaprine, hydrALAZINE, levalbuterol, LORazepam, traMADol   Vital Signs   Vitals:   04/19/21 1937 04/20/21 0000 04/20/21 0341 04/20/21 0411  BP: 125/88   (!) 135/93  Pulse: 79   78  Resp: 19   18  Temp: 97.7 F (36.5 C)   97.8 F (36.6 C)  TempSrc: Oral   Oral  SpO2: 98%   98%  Weight:  (!) 147.5 kg (!) 147.5 kg   Height:        Intake/Output Summary (Last 24 hours) at 04/20/2021 0827 Last data filed at 04/20/2021 0600 Gross per 24 hour  Intake 1307 ml  Output 4886 ml  Net -3579 ml   Last 3 Weights 04/20/2021 04/20/2021 04/19/2021  Weight (lbs) 325 lb 2.9 oz 325 lb 2.9 oz 331 lb 12.7 oz  Weight (kg) 147.5 kg 147.5 kg 150.5 kg      Physical Exam   Vitals:   04/19/21 1937 04/20/21 0000 04/20/21 0341 04/20/21 0411  BP: 125/88   (!) 135/93  Pulse: 79   78  Resp: 19   18  Temp: 97.7 F (36.5 C)   97.8 F (36.6  C)  TempSrc: Oral   Oral  SpO2: 98%   98%  Weight:  (!) 147.5 kg (!) 147.5 kg   Height:        Intake/Output Summary (Last 24 hours) at 04/20/2021 0827 Last data filed at 04/20/2021 0600 Gross per 24 hour  Intake 1307 ml  Output 4886 ml  Net -3579 ml    Last 3 Weights 04/20/2021 04/20/2021 04/19/2021  Weight (lbs) 325 lb 2.9 oz 325 lb 2.9 oz 331 lb 12.7 oz  Weight (kg) 147.5 kg 147.5 kg 150.5 kg    Body mass index is 38.56 kg/m.   General: Well nourished, well developed, in no acute distress Head: Atraumatic, normal size  Eyes: PEERLA, EOMI  Neck: Supple, no JVD Endocrine: No thryomegaly Cardiac: Normal S1, S2; irregular rhythm, no murmurs Lungs: Clear to auscultation bilaterally, no wheezing, rhonchi or rales  Abd: Soft, nontender, no hepatomegaly  Ext: Trace edema in the right lower extremity Musculoskeletal: No deformities, BUE and BLE strength normal and equal Skin: Warm and dry, no rashes   Neuro: Alert and  oriented to person, place, time, and situation, CNII-XII grossly intact, no focal deficits  Psych: Normal mood and affect   Labs  High Sensitivity Troponin:  No results for input(s): TROPONINIHS in the last 720 hours.   Cardiac EnzymesNo results for input(s): TROPONINI in the last 168 hours. No results for input(s): TROPIPOC in the last 168 hours.  Chemistry Recent Labs  Lab 04/17/21 0133 04/18/21 0113 04/19/21 0352 04/20/21 0349  NA 139 138 138 135  K 3.5 3.7 3.7 3.6  CL 103 105 102 96*  CO2 25 26 27 30   GLUCOSE 127* 128* 121* 125*  BUN 17 25* 26* 29*  CREATININE 1.24 1.19 1.29* 1.32*  CALCIUM 8.9 8.5* 8.4* 9.1  PROT 7.7  --   --   --   ALBUMIN 3.3*  --   --   --   AST 31  --   --   --   ALT 34  --   --   --   ALKPHOS 104  --   --   --   BILITOT 0.9  --   --   --   GFRNONAA >60 >60 >60 >60  ANIONGAP 11 7 9 9     Hematology Recent Labs  Lab 04/18/21 0113 04/19/21 0352 04/20/21 0349  WBC 17.1* 12.0* 13.0*  RBC 4.45 4.64 4.92  HGB 12.3* 13.0  13.4  HCT 38.5* 40.8 43.1  MCV 86.5 87.9 87.6  MCH 27.6 28.0 27.2  MCHC 31.9 31.9 31.1  RDW 16.5* 17.1* 16.8*  PLT 309 330 343   BNP Recent Labs  Lab 04/16/21 1607  BNP 334.4*    DDimer No results for input(s): DDIMER in the last 168 hours.   Radiology  No results found.  Cardiac Studies  TTE 04/02/2021  1. Left ventricular ejection fraction, by estimation, is 55 to 60%. The  left ventricle has normal function. The left ventricle has no regional  wall motion abnormalities. There is mild concentric left ventricular  hypertrophy. Left ventricular diastolic  parameters are indeterminate.   2. Right ventricular systolic function is normal. The right ventricular  size is mildly enlarged.   3. Left atrial size was mildly dilated.   4. The mitral valve is normal in structure. Mild to moderate mitral valve  regurgitation.   5. The aortic valve is tricuspid. Aortic valve regurgitation is not  visualized. No aortic stenosis is present.   6. The inferior vena cava is normal in size with greater than 50%  respiratory variability, suggesting right atrial pressure of 3 mmHg.   Patient Profile  [name] is a 65 y.o. male with permanent atrial fibrillation (status post failed ablation and cardioversion), diastolic heart failure, morbid obesity, sleep apnea who was admitted on 04/16/2021 for acute on chronic diastolic heart failure.  Assessment & Plan   #Acute on Chronic Diastolic HF -Net -3.5 L overnight.  Net -6.3 L since admission.  He is effectively euvolemic in my opinion.  He has trace lower extremity edema in the right lower extremity.  This is likely related to trauma to that leg. -Transition back to torsemide 20 mg daily. -Regarding BP medications would continue Coreg 25 mg twice daily.  Continue Aldactone 25 mg daily.  I have stopped his BiDil.  Added losartan 50 mg daily.  This is much more effective for him.  He is also on diltiazem for A. fib.  Would continue these at  discharge.  We will arrange hospital follow-up. -Counseled extensively about salt reductive  strategies.  #Permanent atrial fibrillation -Continue Coreg and diltiazem for rate control.  Failed ablation and cardioversions in the past.  Appears to be a permanent issue for him. -Continue Eliquis 5 mg twice daily.  #Morning headache -Wonder if this is sleep apnea related.  Will benefit from evaluation as an outpatient.  [facility] will sign off.   Medication Recommendations: As above Other recommendations (labs, testing, etc): None Follow up as an outpatient: We will arrange outpatient hospital follow-up in 2 to 3 weeks in our office.  For questions or updates, please contact [place] Please consult [email or web address] for contact info under   Time Spent with Patient: I have spent a total of 35 minutes with patient reviewing hospital notes, telemetry, EKGs, labs and examining the patient as well as establishing an assessment and plan that was discussed with the patient.  > 50% of time was spent in direct patient care.    Signed, Addison Naegeli. Audie Box, MD, [place]  04/20/2021 8:27 AM

## 2021-04-20 NOTE — Plan of Care (Signed)

## 2021-04-20 NOTE — Progress Notes (Signed)
Patient refuses to wear telemetry monitor, patient educated patient still continues to refuse.

## 2021-04-20 NOTE — Progress Notes (Signed)
Patient requested benadryl for itching, provider notified.

## 2021-04-20 NOTE — Progress Notes (Signed)
PROGRESS NOTE    EDYN [name]  UEA:[id number] DOB: Sep 29, 1955 DOA: 04/16/2021 PCP: [name], MD    Chief Complaint  Patient presents with   Shortness of Breath    Brief Narrative:  [name] is a 65 y.o. male with a hx of obesity, HTN, HLD, permanent atrial fibrillation,  mild to moderate MR, chronic diastolic heart failure, restless leg syndrome presented to ED due to short of breath, weight gain, lower extremity edema, found to be in acute on chronic diastolic CHF exacerbation Also had a fever 100.9 in the ED, currently on antibiotic for presumed UTI, urine culture in process  Subjective:  Feeling better today, no short of breath, edema has much improved  He feels like he  is not ready to go home today but will be ready to go home tomorrow  Assessment & Plan:   Principal Problem:   Acute on chronic diastolic CHF (congestive heart failure) (HCC) Active Problems:   Anxiety with depression   Essential hypertension   Persistent atrial fibrillation (HCC)   SOB (shortness of breath)   Sepsis ([place])   Acute lower UTI   Acute on chronic diastolic CHF, mild to moderate mitral regurgitation -Present with significant volume overload, respiratory distress -Chest x-ray "Cardiac enlargement with mild vascular congestion. No edema or Consolidation" -Report was started on Demadex a month ago, dose increased 2 weeks ago, however it did not help - diuresing well on IV Lasix, spironolactone , received 1 dose metolazone on 10/14 -Currently on Demadex, spironolactone -management per cardiology, appreciate input   Persistent A. Fib With severe left atrial enlargement Rate controlled on Coreg and Cardizem, also on Eliquis Cardiology consulted, input appreciated  HTN Currently on Coreg, Cardizem, Demadex ,spironolactone. Cardiology switched by due to losartan  UTI with Fever 100.9 in the ED, leukocytosis Urine culture + staph Reports urine was cloudy when coming  in, now is clear, fever resolved with rocephin Change to keflex per culture /sensitivity result, plan total of 5 days abx treatment, last dose on 10/16 Will discuss with him about Flomax   Restless leg syndrome Significant leg pain for the last year ,progressively got worse Was referred to neurology however he did not make to the  appointments several times  ambulatory referral to [facility] neurology order placed per patient's request   Patient report current Neurontin dose and requip dose is helping , would like to continue  monitor renal function and sedation  Continue Cymbalta  iron level does show iron deficiency which could cause restless leg,  started on iron supplement He does not like ultram , ultram discontinued    Anxiety Like to keep prn ativan while in the hospital  Elevated a1c at 6.5 , a.m. fasting blood glucose about 126 intermittently Prediabetes /borderline diabetes Discussed diet, exercise, weight loss strategies  Class III obesity: Body mass index is 38.56 kg/m.Marland Kitchen Suspect underlying OSA, he does not appear to be interested in sleep study, he does not want CPAP       Unresulted Labs (From admission, onward)     Start     Ordered   04/21/21 0500  CBC  Tomorrow morning,   R       Question:  Specimen collection method  Answer:  Lab=Lab collect   04/20/21 0859   04/19/21 4782  Basic metabolic panel  Daily,   R     Question:  Specimen collection method  Answer:  Lab=Lab collect   04/18/21 1916  DVT prophylaxis: SCDs Start: 04/16/21 2326 apixaban (ELIQUIS) tablet 5 mg   Code Status: Full Family Communication: Wife at bedside on 10/12, 10/13 Disposition:   Status is: Inpatient  Dispo: The patient is from: Home              Anticipated d/c is to: Home              Anticipated d/c date is: likely tomorrow                 Consultants:  Cardiology  Procedures:  None  Antimicrobials:   Anti-infectives (From admission, onward)    Start      Dose/Rate Route Frequency Ordered Stop   04/19/21 2200  cephALEXin (KEFLEX) capsule 500 mg        500 mg Oral Every 12 hours 04/19/21 1622     04/17/21 0045  cefTRIAXone (ROCEPHIN) 1 g in sodium chloride 0.9 % 100 mL IVPB  Status:  Discontinued        1 g 200 mL/hr over 30 Minutes Intravenous Every 24 hours 04/16/21 2356 04/19/21 1622           Objective: Vitals:   04/20/21 0000 04/20/21 0341 04/20/21 0411 04/20/21 1213  BP:   (!) 135/93 113/87  Pulse:   78 66  Resp:   18 18  Temp:   97.8 F (36.6 C) 97.9 F (36.6 C)  TempSrc:   Oral Oral  SpO2:   98% 96%  Weight: (!) 147.5 kg (!) 147.5 kg    Height:        Intake/Output Summary (Last 24 hours) at 04/20/2021 1632 Last data filed at 04/20/2021 5681 Gross per 24 hour  Intake 1307 ml  Output 3358 ml  Net -2051 ml   Filed Weights   04/19/21 0340 04/20/21 0000 04/20/21 0341  Weight: (!) 150.5 kg (!) 147.5 kg (!) 147.5 kg    Examination:  General exam: alert, awake, communicative, less anxious, he is very happy that he is getting better Respiratory system: Clear to auscultation. Respiratory effort normal. Cardiovascular system:  IRRR.  Gastrointestinal system: Abdomen is nondistended, soft and nontender.  Normal bowel sounds heard. Central nervous system: Alert and oriented. No focal neurological deficits. Extremities:  less edema Skin: No rashes, lesions or ulcers Psychiatry: Pleasant, cooperative    Data Reviewed: I have personally reviewed following labs and imaging studies  CBC: Recent Labs  Lab 04/16/21 1607 04/17/21 0133 04/18/21 0113 04/19/21 0352 04/20/21 0349  WBC 9.8 12.9* 17.1* 12.0* 13.0*  NEUTROABS 7.4  --   --  7.4 8.3*  HGB 12.4* 12.9* 12.3* 13.0 13.4  HCT 37.8* 40.3 38.5* 40.8 43.1  MCV 85.1 86.1 86.5 87.9 87.6  PLT 251 269 309 330 275    Basic Metabolic Panel: Recent Labs  Lab 04/16/21 1607 04/17/21 0133 04/18/21 0113 04/19/21 0352 04/20/21 0349  NA 140 139 138 138 135  K  4.5 3.5 3.7 3.7 3.6  CL 108 103 105 102 96*  CO2 23 25 26 27 30   GLUCOSE 110* 127* 128* 121* 125*  BUN 19 17 25* 26* 29*  CREATININE 0.94 1.24 1.19 1.29* 1.32*  CALCIUM 9.1 8.9 8.5* 8.4* 9.1  MG 2.1 2.0 2.1 2.2 2.1  PHOS 3.3 3.2  --   --   --     GFR: Estimated Creatinine Clearance: 91.1 mL/min (A) (by C-G formula based on SCr of 1.32 mg/dL (H)).  Liver Function Tests: Recent Labs  Lab 04/17/21 0133  AST 31  ALT 34  ALKPHOS 104  BILITOT 0.9  PROT 7.7  ALBUMIN 3.3*    CBG: No results for input(s): GLUCAP in the last 168 hours.   Recent Results (from the past 240 hour(s))  Urine Culture     Status: Abnormal   Collection Time: 04/16/21  5:18 AM   Specimen: Urine, Clean Catch  Result Value Ref Range Status   Specimen Description URINE, CLEAN CATCH  Final   Special Requests   Final    NONE Performed at [facility], [address], [place], [place] [address]    Culture 70,000 COLONIES/mL STAPHYLOCOCCUS AUREUS (A)  Final   Report Status 04/19/2021 FINAL  Final   Organism ID, Bacteria STAPHYLOCOCCUS AUREUS (A)  Final      Susceptibility   Staphylococcus aureus - MIC*    CIPROFLOXACIN <=0.5 SENSITIVE Sensitive     GENTAMICIN <=0.5 SENSITIVE Sensitive     NITROFURANTOIN 32 SENSITIVE Sensitive     OXACILLIN <=0.25 SENSITIVE Sensitive     TETRACYCLINE <=1 SENSITIVE Sensitive     VANCOMYCIN 1 SENSITIVE Sensitive     TRIMETH/SULFA <=10 SENSITIVE Sensitive     CLINDAMYCIN RESISTANT Resistant     RIFAMPIN <=0.5 SENSITIVE Sensitive     Inducible Clindamycin POSITIVE Resistant     * 70,000 COLONIES/mL STAPHYLOCOCCUS AUREUS  Resp Panel by RT-PCR (Flu A&B, Covid) Nasopharyngeal Swab     Status: None   Collection Time: 04/16/21  7:49 PM   Specimen: Nasopharyngeal Swab; Nasopharyngeal(NP) swabs in vial transport medium  Result Value Ref Range Status   SARS Coronavirus 2 by RT PCR NEGATIVE NEGATIVE Final    Comment: (NOTE) SARS-CoV-2 target nucleic acids are NOT  DETECTED.  The SARS-CoV-2 RNA is generally detectable in upper respiratory specimens during the acute phase of infection. The lowest concentration of SARS-CoV-2 viral copies this assay can detect is 138 copies/mL. A negative result does not preclude SARS-Cov-2 infection and should not be used as the sole basis for treatment or other patient management decisions. A negative result may occur with  improper specimen collection/handling, submission of specimen other than nasopharyngeal swab, presence of viral mutation(s) within the areas targeted by this assay, and inadequate number of viral copies(<138 copies/mL). A negative result must be combined with clinical observations, patient history, and epidemiological information. The expected result is Negative.  Fact Sheet for Patients:  EntrepreneurPulse.com.au  Fact Sheet for Healthcare Providers:  IncredibleEmployment.be  This test is no t yet approved or cleared by the [place] FDA and  has been authorized for detection and/or diagnosis of SARS-CoV-2 by FDA under an Emergency Use Authorization (EUA). This EUA will remain  in effect (meaning this test can be used) for the duration of the COVID-19 declaration under Section 564(b)(1) of the Act, 21 U.S.C.section 360bbb-3(b)(1), unless the authorization is terminated  or revoked sooner.       Influenza A by PCR NEGATIVE NEGATIVE Final   Influenza B by PCR NEGATIVE NEGATIVE Final    Comment: (NOTE) The Xpert Xpress SARS-CoV-2/FLU/RSV plus assay is intended as an aid in the diagnosis of influenza from Nasopharyngeal swab specimens and should not be used as a sole basis for treatment. Nasal washings and aspirates are unacceptable for Xpert Xpress SARS-CoV-2/FLU/RSV testing.  Fact Sheet for Patients: EntrepreneurPulse.com.au  Fact Sheet for Healthcare Providers: IncredibleEmployment.be  This test is not yet  approved or cleared by the [place] FDA and has been authorized for detection and/or diagnosis of SARS-CoV-2 by FDA  under an Emergency Use Authorization (EUA). This EUA will remain in effect (meaning this test can be used) for the duration of the COVID-19 declaration under Section 564(b)(1) of the Act, 21 U.S.C. section 360bbb-3(b)(1), unless the authorization is terminated or revoked.  Performed at [facility], [address] [place], [place] [address]   Culture, blood (Routine X 2) w Reflex to ID Panel     Status: None (Preliminary result)   Collection Time: 04/17/21  1:33 AM   Specimen: BLOOD  Result Value Ref Range Status   Specimen Description BLOOD SITE NOT SPECIFIED  Final   Special Requests AEROBIC BOTTLE ONLY Blood Culture adequate volume  Final   Culture   Final    NO GROWTH 3 DAYS Performed at [facility], [place] [address], [place], [place] [address]    Report Status PENDING  Incomplete  Culture, blood (Routine X 2) w Reflex to ID Panel     Status: None (Preliminary result)   Collection Time: 04/17/21  1:33 AM   Specimen: BLOOD  Result Value Ref Range Status   Specimen Description BLOOD SITE NOT SPECIFIED  Final   Special Requests AEROBIC BOTTLE ONLY Blood Culture adequate volume  Final   Culture   Final    NO GROWTH 3 DAYS Performed at [facility], [address], [place], [place] [address]    Report Status PENDING  Incomplete         Radiology Studies: No results found.      Scheduled Meds:  antiseptic oral rinse  15 mL Mouth Rinse BID   apixaban  5 mg Oral BID   carvedilol  25 mg Oral BID   cephALEXin  500 mg Oral Q12H   diltiazem  360 mg Oral Daily   DULoxetine  60 mg Oral Daily   ferrous sulfate  325 mg Oral Q breakfast   gabapentin  200 mg Oral TID   influenza vac split quadrivalent PF  0.5 mL Intramuscular Tomorrow-1000   losartan  50 mg Oral Daily   melatonin  3 mg Oral QHS   potassium chloride SA  20  mEq Oral Daily   rOPINIRole  3 mg Oral QHS   spironolactone  25 mg Oral Daily   torsemide  20 mg Oral Daily   Continuous Infusions:     LOS: 4 days   Time spent: 45mins Greater than 50% of this time was spent in counseling, explanation of diagnosis, planning of further management, and coordination of care.   Voice Recognition Viviann Spare dictation system was used to create this note, attempts have been made to correct errors. Please contact the author with questions and/or clarifications.   Florencia Reasons, MD PhD FACP [facility]  Available via Epic secure chat 7am-7pm for nonurgent issues Please page for urgent issues To page the attending provider between 7A-7P or the covering provider during after hours 7P-7A, please log into the web site [email or web address] and access using universal [place] password for that web site. If you do not have the password, please call the hospital operator.    04/20/2021, 4:32 PM

## 2021-04-21 LAB — BASIC METABOLIC PANEL
Anion gap: 9 (ref 5–15)
BUN: 30 mg/dL — ABNORMAL HIGH (ref 8–23)
CO2: 28 mmol/L (ref 22–32)
Calcium: 9.1 mg/dL (ref 8.9–10.3)
Chloride: 98 mmol/L (ref 98–111)
Creatinine, Ser: 1.4 mg/dL — ABNORMAL HIGH (ref 0.61–1.24)
GFR, Estimated: 56 mL/min — ABNORMAL LOW (ref >60)
Glucose, Bld: 134 mg/dL — ABNORMAL HIGH (ref 70–99)
Potassium: 3.6 mmol/L (ref 3.5–5.1)
Sodium: 135 mmol/L (ref 135–145)

## 2021-04-21 LAB — CBC
HCT: 45.4 % (ref 39.0–52.0)
Hemoglobin: 14.7 g/dL (ref 13.0–17.0)
MCH: 27.9 pg (ref 26.0–34.0)
MCHC: 32.4 g/dL (ref 30.0–36.0)
MCV: 86.3 fL (ref 80.0–100.0)
Platelets: 352 10*3/uL (ref 150–400)
RBC: 5.26 MIL/uL (ref 4.22–5.81)
RDW: 16.7 % — ABNORMAL HIGH (ref 11.5–15.5)
WBC: 11 10*3/uL — ABNORMAL HIGH (ref 4.0–10.5)
nRBC: 0 % (ref 0.0–0.2)

## 2021-04-21 MED ORDER — GABAPENTIN 100 MG PO CAPS: 200.0000 mg | ORAL_CAPSULE | Freq: Three times a day (TID) | ORAL | 0 refills | Status: DC

## 2021-04-21 MED ORDER — FERROUS SULFATE 325 (65 FE) MG PO TABS: 325.0000 mg | ORAL_TABLET | ORAL | 0 refills | Status: DC

## 2021-04-21 MED ORDER — BIOTENE DRY MOUTH MT LIQD: 15.0000 mL | Freq: Two times a day (BID) | OROMUCOSAL | 0 refills | Status: DC

## 2021-04-21 MED ORDER — LOSARTAN POTASSIUM 50 MG PO TABS: 50.0000 mg | ORAL_TABLET | Freq: Every day | ORAL | 0 refills | Status: DC

## 2021-04-21 MED ORDER — SPIRONOLACTONE 25 MG PO TABS: 25.0000 mg | ORAL_TABLET | Freq: Every day | ORAL | 9 refills | Status: DC

## 2021-04-21 MED ORDER — LORAZEPAM 0.5 MG PO TABS: 0.5000 mg | ORAL_TABLET | Freq: Two times a day (BID) | ORAL | 0 refills | Status: AC | PRN

## 2021-04-21 NOTE — Plan of Care (Signed)
Patient discharging today 

## 2021-04-21 NOTE — Plan of Care (Signed)
  Problem: Clinical Measurements: Goal: Will remain free from infection Outcome: Progressing   Problem: Clinical Measurements: Goal: Respiratory complications will improve Outcome: Progressing   Problem: Clinical Measurements: Goal: Cardiovascular complication will be avoided Outcome: Progressing   Problem: Elimination: Goal: Will not experience complications related to urinary retention Outcome: Progressing   Problem: Safety: Goal: Ability to remain free from injury will improve Outcome: Progressing   Problem: Cardiac: Goal: Ability to achieve and maintain adequate cardiopulmonary perfusion will improve Outcome: Progressing   Problem: Education: Goal: Knowledge of General Education information will improve Description: Including pain rating scale, medication(s)/side effects and non-pharmacologic comfort measures Outcome: Progressing

## 2021-04-21 NOTE — Discharge Summary (Signed)
Discharge Summary  [name] TZG:[id number] DOB: Apr 05, 1956  PCP: [name], MD  Admit date: 04/16/2021 Discharge date: 04/21/2021  Time spent: 42mins, more than 50% time spent on coordination of care.   Recommendations for Outpatient Follow-up:  F/u with PCP within a week  for hospital discharge follow up, repeat cbc/bmp at follow up, pcp to monitor kidney function  F/u with cardiology, recommend outpatient sleep study F/u with urology   Discharge Diagnoses:  Active Hospital Problems   Diagnosis Date Noted   Acute on chronic diastolic CHF (congestive heart failure) ([place]) 04/17/2021   SOB (shortness of breath) 04/17/2021   Sepsis ([place]) 04/17/2021   Acute lower UTI 04/17/2021   Persistent atrial fibrillation ([place]) 05/02/2019   Anxiety with depression 02/13/2009   Essential hypertension 02/12/2009    Resolved Hospital Problems  No resolved problems to display.    Discharge Condition: stable  Diet recommendation: heart healthy/carb modified  Filed Weights   04/20/21 0000 04/20/21 0341 04/21/21 0353  Weight: (!) 147.5 kg (!) 147.5 kg (!) 146.6 kg    History of present illness: ( per admitting MD Dr Velia Meyer)  [name] is a 65 y.o. male with medical history significant for chronic diastolic heart failure, persistent atrial fibrillation chronically anticoagulated on Eliquis hypertension, generalized anxiety disorder, who is admitted to [facility] on 04/16/2021 by way of transfer from [facility] ED with acute on chronic diastolic heart failure after presenting from home to the latter facility complaining of shortness of breath.   Hospital Course:  Principal Problem:   Acute on chronic diastolic CHF (congestive heart failure) (HCC) Active Problems:   Anxiety with depression   Essential hypertension   Persistent atrial fibrillation (HCC)   SOB (shortness of breath)   Sepsis (HCC)   Acute lower UTI  Acute on chronic diastolic CHF, mild to  moderate mitral regurgitation -Present with significant volume overload, respiratory distress -Chest x-ray "Cardiac enlargement with mild vascular congestion. No edema or Consolidation" -Report was started on Demadex a month ago, dose increased 2 weeks ago, however it did not help - seen by cardiology, he diuresed  well on IV Lasix, spironolactone , received 1 dose metolazone on 10/14 -he is cleared to discharge home by cardiology, discharged on  Demadex, spironolactone -f/u with cardiology     Persistent A. Fib With severe left atrial enlargement Rate controlled on Coreg and Cardizem, also on Eliquis Cardiology consulted, input appreciated   HTN Currently on Coreg, Cardizem, losartan, Demadex ,spironolactone. F/u with pcp and cardiology   UTI with Fever 100.9 in the ED, leukocytosis Urine culture + staph Reports urine was cloudy on admission , now is clear, fever resolved Change to keflex per culture /sensitivity result, finished  total of 5 days abx treatment, last dose on 10/16 F/u with urology ( reports recurrent uti recently)     Restless leg syndrome Significant leg pain for the last year ,progressively got worse Was referred to neurology however he did not make to the  appointments several times  ambulatory referral to [facility] neurology order placed per patient's request   Patient report current Neurontin dose and requip dose is helping , would like to continue , prescription provided  monitor renal function Continue Cymbalta  iron level does show iron deficiency which could cause restless leg,  started on iron supplement He does not like ultram , ultram discontinued      Anxiety  prn ativan helped, requested prescription at discharge, total of 10tabs ativan proscribed at  discharge   Elevated a1c at 6.5 , a.m. fasting blood glucose about 126 intermittently Prediabetes /borderline diabetes Discussed diet, exercise, weight loss strategies, he is motivated to make life style  changes   Class III obesity: Body mass index is 38.56 kg/m.Marland Kitchen Suspect underlying OSA,  initially  appear to be interested in sleep study, he does not want CPAP, he is now more receptible to outpatient sleep study          Procedures: none  Consultations: Cardiology   Discharge Exam: BP (!) 156/93 (BP Location: Left Arm)   Pulse 81   Temp 97.7 F (36.5 C) (Oral)   Resp (!) 22   Ht 6\' 5"  (1.956 m)   Wt (!) 146.6 kg   SpO2 98%   BMI 38.31 kg/m   General: NAD, pleasant  Cardiovascular: IRRR, edema has much improved  Respiratory: normal respiratory effort  Discharge Instructions You were cared for by a hospitalist during your hospital stay. If you have any questions about your discharge medications or the care you received while you were in the hospital after you are discharged, you can call the unit and asked to speak with the hospitalist on call if the hospitalist that took care of you is not available. Once you are discharged, your primary care physician will handle any further medical issues. Please note that NO REFILLS for any discharge medications will be authorized once you are discharged, as it is imperative that you return to your primary care physician (or establish a relationship with a primary care physician if you do not have one) for your aftercare needs so that they can reassess your need for medications and monitor your lab values.  Discharge Instructions     Ambulatory referral to Neurology   Complete by: As directed    An appointment is requested in approximately: 4 weeks For restless leg syndrome   Diet - low sodium heart healthy   Complete by: As directed    Carb modified diet   Increase activity slowly   Complete by: As directed       Allergies as of 04/21/2021       Reactions   Hydrocodone Other (See Comments)   GI upset, dizziness        Medication List     STOP taking these medications    ALPRAZolam 0.5 MG tablet Commonly known as:  XANAX   olmesartan 40 MG tablet Commonly known as: BENICAR       TAKE these medications    antiseptic oral rinse Liqd 15 mLs by Mouth Rinse route 2 (two) times daily.   carvedilol 25 MG tablet Commonly known as: COREG Take 1 tablet (25 mg total) by mouth 2 (two) times daily.   diltiazem 360 MG 24 hr capsule Commonly known as: CARDIZEM CD Take 1 capsule (360 mg total) by mouth daily.   DULoxetine 60 MG capsule Commonly known as: CYMBALTA SMARTSIG:1 Capsule(s) By Mouth Every Evening   Eliquis 5 MG Tabs tablet Generic drug: apixaban TAKE 1 TABLET(5 MG) BY MOUTH TWICE DAILY   ferrous sulfate 325 (65 FE) MG tablet Take 1 tablet (325 mg total) by mouth every Monday, Wednesday, and Friday. Take with breakfast Start taking on: April 22, 2021   gabapentin 100 MG capsule Commonly known as: NEURONTIN Take 2 capsules (200 mg total) by mouth 3 (three) times daily.   LORazepam 0.5 MG tablet Commonly known as: Ativan Take 1 tablet (0.5 mg total) by mouth 2 (two) times daily as  needed for up to 5 days for anxiety.   losartan 50 MG tablet Commonly known as: COZAAR Take 1 tablet (50 mg total) by mouth daily. Start taking on: April 22, 2021   montelukast 10 MG tablet Commonly known as: SINGULAIR Take 10 mg by mouth daily as needed (sob).   potassium chloride SA 20 MEQ tablet Commonly known as: KLOR-CON Take 1 tablet (20 mEq total) by mouth daily.   rOPINIRole 1 MG tablet Commonly known as: REQUIP Take 1-2 mg by mouth at bedtime as needed (restless leg syndrome). 1 tablet after lunch and 2 tablet at bedtime   spironolactone 25 MG tablet Commonly known as: ALDACTONE Take 1 tablet (25 mg total) by mouth daily. Start taking on: April 22, 2021   tadalafil 20 MG tablet Commonly known as: CIALIS Take 20 mg by mouth daily as needed.   torsemide 20 MG tablet Commonly known as: DEMADEX Take 1 tablet (20 mg total) by mouth daily.       Allergies  Allergen Reactions    Hydrocodone Other (See Comments)    GI upset, dizziness    Follow-up Information     [facility] Follow up.   Why: active with outpatient palliative services Contact information: [place]        Lelon Perla, MD Follow up on 05/20/2021.   Specialty: Cardiology Why: Keep scheduled Cardiology follow-up for 05/20/2021 at 8:30 AM. Contact information: [place] [address] [phone]         [name], MD Follow up in 1 week(s).   Specialty: Family Medicine Why: hospital discharge follow up, repeat cbc/bmp at follow up  pcp to monitor kidney function Contact information: [place] [address] [phone]         Lelon Perla, MD .   Specialty: Cardiology Contact information: [address] [place] [address] [phone]         f/u with urology Follow up.                   The results of significant diagnostics from this hospitalization (including imaging, microbiology, ancillary and laboratory) are listed below for reference.    Significant Diagnostic Studies: DG Chest 2 View  Result Date: 04/16/2021 CLINICAL DATA:  Shortness of breath EXAM: CHEST - 2 VIEW COMPARISON:  Chest x-ray 11/17/2018, CT chest 11/17/2018, chest x-ray 07/15/2004 FINDINGS: The heart and mediastinal contours are unchanged. Biapical pleural/pulmonary scarring. No focal consolidation. No pulmonary edema. No pleural effusion. No pneumothorax. No acute osseous abnormality. IMPRESSION: No active cardiopulmonary disease. Electronically Signed   By: Iven Finn M.D.   On: 04/16/2021 15:19   DG Chest Port 1 View  Result Date: 04/17/2021 CLINICAL DATA:  Acute respiratory disease EXAM: PORTABLE CHEST 1 VIEW COMPARISON:  04/16/2021 FINDINGS: Shallow inspiration. Cardiac enlargement. Probable central pulmonary vascular congestion.  No airspace disease or consolidation. No pleural effusions. No pneumothorax. Mediastinal contours appear intact. IMPRESSION: Cardiac enlargement with mild vascular congestion. No edema or consolidation. Electronically Signed   By: Lucienne Capers M.D.   On: 04/17/2021 03:09   ECHOCARDIOGRAM COMPLETE  Result Date: 04/02/2021    ECHOCARDIOGRAM REPORT   Patient Name:   WILLIAN DONSON Date of Exam: 04/02/2021 Medical Rec #:  [id number]         Height:       76.0 in Accession #:    [id number]  Weight:       310.0 lb Date of Birth:  [birth year]        BSA:          2.669 m Patient Age:    26 years          BP:           146/88 mmHg Patient Gender: M                 HR:           90 bpm. Exam Location:  [facility] Procedure: 2D Echo, Cardiac Doppler and Color Doppler Indications:    I42.9 Cardiomyopathy  History:        Patient has prior history of Echocardiogram examinations, most                 recent 01/19/2020. Risk Factors:Hypertension and Dyslipidemia.                 Pneumonia. Nonischemic cardiomyopathy.  Sonographer:    Wilford Sports Rodgers-Jones RDCS Referring Phys: [place]  1. Left ventricular ejection fraction, by estimation, is 55 to 60%. The left ventricle has normal function. The left ventricle has no regional wall motion abnormalities. There is mild concentric left ventricular hypertrophy. Left ventricular diastolic parameters are indeterminate.  2. Right ventricular systolic function is normal. The right ventricular size is mildly enlarged.  3. Left atrial size was mildly dilated.  4. The mitral valve is normal in structure. Mild to moderate mitral valve regurgitation.  5. The aortic valve is tricuspid. Aortic valve regurgitation is not visualized. No aortic stenosis is present.  6. The inferior vena cava is normal in size with greater than 50% respiratory variability, suggesting right atrial pressure of 3 mmHg. Comparison(s): A prior study was performed on 01/19/2020.  Systolic blunting of the pulmonary vein Doppler signal similar from prior. FINDINGS  Left Ventricle: Left ventricular ejection fraction, by estimation, is 55 to 60%. The left ventricle has normal function. The left ventricle has no regional wall motion abnormalities. The left ventricular internal cavity size was normal in size. There is  mild concentric left ventricular hypertrophy. Left ventricular diastolic parameters are indeterminate. Right Ventricle: The right ventricular size is mildly enlarged. No increase in right ventricular wall thickness. Right ventricular systolic function is normal. Left Atrium: Left atrial size was mildly dilated. Right Atrium: Right atrial size was normal in size. Pericardium: There is no evidence of pericardial effusion. Mitral Valve: The mitral valve is normal in structure. Mild to moderate mitral valve regurgitation. Tricuspid Valve: The tricuspid valve is grossly normal. Tricuspid valve regurgitation is not demonstrated. No evidence of tricuspid stenosis. Aortic Valve: The aortic valve is tricuspid. Aortic valve regurgitation is not visualized. No aortic stenosis is present. Pulmonic Valve: The pulmonic valve was not well visualized. Pulmonic valve regurgitation is not visualized. No evidence of pulmonic stenosis. Aorta: The aortic root and ascending aorta are structurally normal, with no evidence of dilitation. Venous: The inferior vena cava is normal in size with greater than 50% respiratory variability, suggesting right atrial pressure of 3 mmHg. IAS/Shunts: There is right bowing of the interatrial septum, suggestive of elevated left atrial pressure. The atrial septum is grossly normal.  LEFT VENTRICLE PLAX 2D LVIDd:         5.30 cm LVIDs:         3.70 cm LV PW:         1.30 cm LV IVS:  1.30 cm LVOT diam:     2.30 cm LV SV:         61 LV SV Index:   23 LVOT Area:     4.15 cm  RIGHT VENTRICLE RV Basal diam:  4.80 cm RV S prime:     10.57 cm/s TAPSE (M-mode): 1.3 cm LEFT  ATRIUM              Index       RIGHT ATRIUM           Index LA diam:        6.50 cm  2.44 cm/m  RA Area:     24.10 cm LA Vol (A2C):   136.0 ml 50.96 ml/m RA Volume:   77.30 ml  28.96 ml/m LA Vol (A4C):   64.1 ml  24.02 ml/m LA Biplane Vol: 97.6 ml  36.57 ml/m  AORTIC VALVE LVOT Vmax:   84.00 cm/s LVOT Vmean:  56.833 cm/s LVOT VTI:    0.147 m  AORTA Ao Root diam: 3.50 cm Ao Asc diam:  3.90 cm MV E velocity: 92.83 cm/s                            SHUNTS                            Systemic VTI:  0.15 m                            Systemic Diam: 2.30 cm Rudean Haskell MD Electronically signed by Rudean Haskell MD Signature Date/Time: 04/02/2021/11:29:15 AM    Final     Microbiology: Recent Results (from the past 240 hour(s))  Urine Culture     Status: Abnormal   Collection Time: 04/16/21  5:18 AM   Specimen: Urine, Clean Catch  Result Value Ref Range Status   Specimen Description URINE, CLEAN CATCH  Final   Special Requests   Final    NONE Performed at [facility], [address] [place], [place] [address]    Culture 70,000 COLONIES/mL STAPHYLOCOCCUS AUREUS (A)  Final   Report Status 04/19/2021 FINAL  Final   Organism ID, Bacteria STAPHYLOCOCCUS AUREUS (A)  Final      Susceptibility   Staphylococcus aureus - MIC*    CIPROFLOXACIN <=0.5 SENSITIVE Sensitive     GENTAMICIN <=0.5 SENSITIVE Sensitive     NITROFURANTOIN 32 SENSITIVE Sensitive     OXACILLIN <=0.25 SENSITIVE Sensitive     TETRACYCLINE <=1 SENSITIVE Sensitive     VANCOMYCIN 1 SENSITIVE Sensitive     TRIMETH/SULFA <=10 SENSITIVE Sensitive     CLINDAMYCIN RESISTANT Resistant     RIFAMPIN <=0.5 SENSITIVE Sensitive     Inducible Clindamycin POSITIVE Resistant     * 70,000 COLONIES/mL STAPHYLOCOCCUS AUREUS  Resp Panel by RT-PCR (Flu A&B, Covid) Nasopharyngeal Swab     Status: None   Collection Time: 04/16/21  7:49 PM   Specimen: Nasopharyngeal Swab; Nasopharyngeal(NP) swabs in vial transport medium  Result Value  Ref Range Status   SARS Coronavirus 2 by RT PCR NEGATIVE NEGATIVE Final    Comment: (NOTE) SARS-CoV-2 target nucleic acids are NOT DETECTED.  The SARS-CoV-2 RNA is generally detectable in upper respiratory specimens during the acute phase of infection. The lowest concentration of SARS-CoV-2 viral copies this assay can detect is 138 copies/mL. A negative result  does not preclude SARS-Cov-2 infection and should not be used as the sole basis for treatment or other patient management decisions. A negative result may occur with  improper specimen collection/handling, submission of specimen other than nasopharyngeal swab, presence of viral mutation(s) within the areas targeted by this assay, and inadequate number of viral copies(<138 copies/mL). A negative result must be combined with clinical observations, patient history, and epidemiological information. The expected result is Negative.  Fact Sheet for Patients:  EntrepreneurPulse.com.au  Fact Sheet for Healthcare Providers:  IncredibleEmployment.be  This test is no t yet approved or cleared by the [place] FDA and  has been authorized for detection and/or diagnosis of SARS-CoV-2 by FDA under an Emergency Use Authorization (EUA). This EUA will remain  in effect (meaning this test can be used) for the duration of the COVID-19 declaration under Section 564(b)(1) of the Act, 21 U.S.C.section 360bbb-3(b)(1), unless the authorization is terminated  or revoked sooner.       Influenza A by PCR NEGATIVE NEGATIVE Final   Influenza B by PCR NEGATIVE NEGATIVE Final    Comment: (NOTE) The Xpert Xpress SARS-CoV-2/FLU/RSV plus assay is intended as an aid in the diagnosis of influenza from Nasopharyngeal swab specimens and should not be used as a sole basis for treatment. Nasal washings and aspirates are unacceptable for Xpert Xpress SARS-CoV-2/FLU/RSV testing.  Fact Sheet for  Patients: EntrepreneurPulse.com.au  Fact Sheet for Healthcare Providers: IncredibleEmployment.be  This test is not yet approved or cleared by the [place] FDA and has been authorized for detection and/or diagnosis of SARS-CoV-2 by FDA under an Emergency Use Authorization (EUA). This EUA will remain in effect (meaning this test can be used) for the duration of the COVID-19 declaration under Section 564(b)(1) of the Act, 21 U.S.C. section 360bbb-3(b)(1), unless the authorization is terminated or revoked.  Performed at [facility], [address], [place], [place] [address]   Culture, blood (Routine X 2) w Reflex to ID Panel     Status: None (Preliminary result)   Collection Time: 04/17/21  1:33 AM   Specimen: BLOOD  Result Value Ref Range Status   Specimen Description BLOOD SITE NOT SPECIFIED  Final   Special Requests AEROBIC BOTTLE ONLY Blood Culture adequate volume  Final   Culture   Final    NO GROWTH 4 DAYS Performed at [facility], [place] [address] [place], [place] [address]    Report Status PENDING  Incomplete  Culture, blood (Routine X 2) w Reflex to ID Panel     Status: None (Preliminary result)   Collection Time: 04/17/21  1:33 AM   Specimen: BLOOD  Result Value Ref Range Status   Specimen Description BLOOD SITE NOT SPECIFIED  Final   Special Requests AEROBIC BOTTLE ONLY Blood Culture adequate volume  Final   Culture   Final    NO GROWTH 4 DAYS Performed at [facility], [address] [place], [place] [address]    Report Status PENDING  Incomplete     Labs: Basic Metabolic Panel: Recent Labs  Lab 04/16/21 1607 04/17/21 0133 04/18/21 0113 04/19/21 0352 04/20/21 0349 04/21/21 0340  NA 140 139 138 138 135 135  K 4.5 3.5 3.7 3.7 3.6 3.6  CL 108 103 105 102 96* 98  CO2 23 25 26 27 30 28   GLUCOSE 110* 127* 128* 121* 125* 134*  BUN 19 17 25* 26* 29* 30*  CREATININE 0.94 1.24 1.19 1.29*  1.32* 1.40*  CALCIUM 9.1 8.9  8.5* 8.4* 9.1 9.1  MG 2.1 2.0 2.1 2.2 2.1  --   PHOS 3.3 3.2  --   --   --   --    Liver Function Tests: Recent Labs  Lab 04/17/21 0133  AST 31  ALT 34  ALKPHOS 104  BILITOT 0.9  PROT 7.7  ALBUMIN 3.3*   No results for input(s): LIPASE, AMYLASE in the last 168 hours. No results for input(s): AMMONIA in the last 168 hours. CBC: Recent Labs  Lab 04/16/21 1607 04/17/21 0133 04/18/21 0113 04/19/21 0352 04/20/21 0349 04/21/21 0340  WBC 9.8 12.9* 17.1* 12.0* 13.0* 11.0*  NEUTROABS 7.4  --   --  7.4 8.3*  --   HGB 12.4* 12.9* 12.3* 13.0 13.4 14.7  HCT 37.8* 40.3 38.5* 40.8 43.1 45.4  MCV 85.1 86.1 86.5 87.9 87.6 86.3  PLT 251 269 309 330 343 352   Cardiac Enzymes: No results for input(s): CKTOTAL, CKMB, CKMBINDEX, TROPONINI in the last 168 hours. BNP: BNP (last 3 results) Recent Labs    04/16/21 1607  BNP 334.4*    ProBNP (last 3 results) No results for input(s): PROBNP in the last 8760 hours.  CBG: No results for input(s): GLUCAP in the last 168 hours.     Signed:  Florencia Reasons MD, PhD, FACP  [facility] 04/21/2021, 10:41 AM

## 2021-04-21 NOTE — Progress Notes (Deleted)
Patient is requesting a shower, shower order needed.

## 2021-04-22 LAB — CULTURE, BLOOD (ROUTINE X 2)
Culture: NO GROWTH
Culture: NO GROWTH
Special Requests: ADEQUATE
Special Requests: ADEQUATE

## 2021-04-24 ENCOUNTER — Other Ambulatory Visit: Payer: Self-pay | Admitting: *Deleted

## 2021-04-24 ENCOUNTER — Encounter: Payer: Self-pay | Admitting: *Deleted

## 2021-04-24 MED ORDER — CEPHALEXIN 500 MG PO CAPS: 500.0000 mg | ORAL_CAPSULE | Freq: Two times a day (BID) | ORAL | 0 refills | Status: DC

## 2021-04-24 NOTE — Progress Notes (Signed)
kel

## 2021-04-25 ENCOUNTER — Telehealth: Payer: Self-pay

## 2021-04-25 NOTE — Telephone Encounter (Signed)
(  10:27 am) SW attempted to schedule initial palliative care visit with patient. SW left a message on his phone requesting a call back. SW also spoke with his wife, who stated that she will advise patient of SW call and have him call SW back.

## 2021-05-01 ENCOUNTER — Telehealth: Payer: Self-pay

## 2021-05-01 DIAGNOSIS — Z515 Encounter for palliative care: Secondary | ICD-10-CM

## 2021-05-01 NOTE — Telephone Encounter (Signed)
(  5:15 pm) SW completed a follow-up call with patient and his wife to discuss/schedule initial palliative care visit. Patient declined services noting that he has lost 30 lbs, walks a mile per day, his knees/ankles were much better and he has a new PCP. He stated that he did not need or want palliative care services at this time. SW advised him that if there was something that palliative care could do for him in the future to please call back.

## 2021-05-09 NOTE — Progress Notes (Deleted)
HPI: FU atrial fibrillation. The patient had a cardiac catheterization in 2004 that showed an ejection fraction of 45% and normal coronary arteries. He did have atrial fibrillation at that time and his LV function improved after sinus rhythm was restored. He ultimately had atrial fibrillation ablation. He did well for several years but then recurrent atrial fibrillation. Transesophageal echocardiogram August 2020 showed normal LV function, severe left atrial enlargement, moderate to severe mitral regurgitation and mild tricuspid regurgitation. Patient had cardioversion March 04, 2019 but atrial fibrillation recurred. Patient seen by Dr. Rayann Heman and repeat ablation felt not indicated as chances of maintaining sinus would be lower given obesity and severe left atrial enlargement.  Plan was to potentially consider referral for surgical Maze procedure and mitral valve repair. Follow-up transthoracic echocardiogram October 2020 showed normal LV function, moderate left atrial enlargement and trace mitral regurgitation. Patient subsequently placed on amiodarone in the atrial fibrillation clinic. Repeat attempt at cardioversion December 2020 unsuccessful. Dr. Roxy Manns evaluated patient for consideration of surgical maze procedure and felt that medical therapy was best option. Most recent echocardiogram 9/22 showed normal LV function, mild LVH, mild RVE, mild LAE, mild to moderate MR. Patient admitted October 2022 with acute on chronic diastolic congestive heart failure.  He was treated with IV diuretics with improvement.  He has also had problems with urinary tract infection and swelling of his penis. Urology outpatient evaluation recommended.  Since last seen,   Current Outpatient Medications  Medication Sig Dispense Refill   antiseptic oral rinse (BIOTENE) LIQD 15 mLs by Mouth Rinse route 2 (two) times daily. 237 mL 0   carvedilol (COREG) 25 MG tablet Take 1 tablet (25 mg total) by mouth 2 (two) times daily.  180 tablet 3   cephALEXin (KEFLEX) 500 MG capsule Take 1 capsule (500 mg total) by mouth 2 (two) times daily. 14 capsule 0   diltiazem (CARDIZEM CD) 360 MG 24 hr capsule Take 1 capsule (360 mg total) by mouth daily. 90 capsule 0   DULoxetine (CYMBALTA) 60 MG capsule SMARTSIG:1 Capsule(s) By Mouth Every Evening     ELIQUIS 5 MG TABS tablet TAKE 1 TABLET(5 MG) BY MOUTH TWICE DAILY 60 tablet 11   ferrous sulfate 325 (65 FE) MG tablet Take 1 tablet (325 mg total) by mouth every Monday, Wednesday, and Friday. Take with breakfast 30 tablet 0   gabapentin (NEURONTIN) 100 MG capsule Take 2 capsules (200 mg total) by mouth 3 (three) times daily. 180 capsule 0   losartan (COZAAR) 50 MG tablet Take 1 tablet (50 mg total) by mouth daily. 30 tablet 0   montelukast (SINGULAIR) 10 MG tablet Take 10 mg by mouth daily as needed (sob).      potassium chloride SA (KLOR-CON) 20 MEQ tablet Take 1 tablet (20 mEq total) by mouth daily. 90 tablet 3   rOPINIRole (REQUIP) 1 MG tablet Take 1-2 mg by mouth at bedtime as needed (restless leg syndrome). 1 tablet after lunch and 2 tablet at bedtime     tadalafil (CIALIS) 20 MG tablet Take 20 mg by mouth daily as needed.     torsemide (DEMADEX) 20 MG tablet Take 1 tablet (20 mg total) by mouth daily. 94 tablet 3   No current facility-administered medications for this visit.     Past Medical History:  Diagnosis Date   Allergic rhinitis    Anxiety    Asthma    as a child   Chronic low back pain    Complication  of anesthesia    "hard to put asleep, hard to wake up, nausea and vomiting"   Depression    ED (erectile dysfunction)    GERD (gastroesophageal reflux disease)    HLD (hyperlipidemia)    HTN (hypertension)    sees Dr. [name], [facility]   Hypertrophy of prostate with urinary obstruction and other lower urinary tract symptoms (LUTS)    IBS (irritable bowel syndrome)    Inguinal hernia without mention of obstruction or gangrene, unilateral or  unspecified, (not specified as recurrent)    Insomnia    Lumbar herniated disc    L7   Morbid obesity ([place])    Narcotic addiction ([place])    NICM (nonischemic cardiomyopathy) ([place])    tachycardia mediated,  resolved with sinus   Persistent atrial fibrillation ([place])    ablation done 03/2006 at [facility]   Pneumonia    hx of 2004   PONV (postoperative nausea and vomiting)    Twitching    legs    Past Surgical History:  Procedure Laterality Date   [facility]  [facility]   CARDIOVERSION  08/11/2011   Procedure: CARDIOVERSION;  Surgeon: Lelon Perla, MD;  Location: [place];  Service: Cardiovascular;  Laterality: N/A;   CARDIOVERSION N/A 04/16/2017   Procedure: CARDIOVERSION;  Surgeon: Sanda Klein, MD;  [facility] END[facility];  Service: Cardiovascular;  Laterality: N/A;   CARDIOVERSION N/A 03/04/2019   Procedure: CARDIOVERSION;  Surgeon: Lelon Perla, MD;  Location: [facility] ENDOSCOPY;  Service: Cardiovascular;  Laterality: N/A;   CARDIOVERSION N/A 07/04/2019   Procedure: CARDIOVERSION;  Surgeon: [name], MD;  Location: [facility] ENDOSCOPY;  Service: Cardiovascular;  Laterality: N/A;   EYE SURGERY     lasik, [place] Right 08/26/2012   Procedure: LAPAROSCOPIC INGUINAL HERNIA;  Surgeon: Madilyn Hook, DO;  Location: [place];  Service: General;  Laterality: Right;  laparoscopic right inguinal hernia repair with mesh, umbilical hernia repair   INSERTION OF MESH Right 08/26/2012   Procedure: INSERTION OF MESH;  Surgeon: Madilyn Hook, DO;  Location: [place];  Service: General;  Laterality: Right;  right inguinal hernia   TEE WITHOUT CARDIOVERSION N/A 03/04/2019   Procedure: TRANSESOPHAGEAL ECHOCARDIOGRAM (TEE);  Surgeon: Lelon Perla, MD;  Location: [place];  Service: Cardiovascular;  Laterality: N/A;   UMBILICAL HERNIA REPAIR N/A 08/26/2012   Procedure: HERNIA REPAIR UMBILICAL ADULT;  Surgeon: Madilyn Hook, DO;  [facility];   Service: General;  Laterality: N/A;    Social History   Socioeconomic History   Marital status: Married    Spouse name: Joash Tony   Number of children: 3   Years of education: Not on file   Highest education level: Bachelor's degree (e.g., BA, AB, BS)  Occupational History   Occupation: retired  Tobacco Use   Smoking status: Never   Smokeless tobacco: Never  Vaping Use   Vaping Use: Never used  Substance and Sexual Activity   Alcohol use: No    Alcohol/week: 0.0 standard drinks   Drug use: No   Sexual activity: Yes  Other Topics Concern   Not on file  Social History Narrative   Lives in [place] Determinants of Health   Financial Resource Strain: Low Risk    Difficulty of Paying Living Expenses: Not hard at all  Food Insecurity: No Food Insecurity   Worried About Charity fundraiser in the  Last Year: Never true   Ran Out of Food in the Last Year: Never true  Transportation Needs: No Transportation Needs   Lack of Transportation (Medical): No   Lack of Transportation (Non-Medical): No  Physical Activity: Not on file  Stress: Not on file  Social Connections: Not on file  Intimate Partner Violence: Not on file    Family History  Problem Relation Age of Onset   Asthma Mother    Breast cancer Mother    Hypertension Mother    COPD Father    Prostate cancer Father    Hypertension Father    Lymphoma Sister     ROS: no fevers or chills, productive cough, hemoptysis, dysphasia, odynophagia, melena, hematochezia, dysuria, hematuria, rash, seizure activity, orthopnea, PND, pedal edema, claudication. Remaining systems are negative.  Physical Exam: Well-developed well-nourished in no acute distress.  Skin is warm and dry.  HEENT is normal.  Neck is supple.  Chest is clear to auscultation with normal expansion.  Cardiovascular exam is regular rate and rhythm.  Abdominal exam nontender or distended. No masses palpated. Extremities show no edema. neuro  grossly intact  ECG- personally reviewed  A/P  1 permanent atrial fibrillation-we will continue Cardizem, carvedilol for rate control.  Continue apixaban.  I do not think we will be able to minimum maintain sinus rhythm.  2 chronic diastolic congestive heart failure-patient much improved following recent admission for diuresis.  We will continue Demadex and spironolactone at present dose.  Check potassium and renal function.   3 hypertension-blood pressure is controlled.  Continue present medications.  4 history of cardiomyopathy-LV function has normalized.  Continue present medications.  5 mitral regurgitation-mild to moderate on most recent echocardiogram.  He will need follow-up studies in the future.  6 obesity-needs continued efforts at diet, exercise and weight loss.  7 recent penile swelling/urinary tract infection-I again emphasized that he needs to follow-up with urology.  Kirk Ruths, MD

## 2021-05-20 ENCOUNTER — Ambulatory Visit: Payer: 59 | Admitting: Cardiology

## 2021-05-27 ENCOUNTER — Ambulatory Visit: Payer: 59 | Admitting: Physical Medicine and Rehabilitation

## 2021-06-10 ENCOUNTER — Other Ambulatory Visit: Payer: Self-pay | Admitting: Cardiology

## 2021-06-10 ENCOUNTER — Encounter: Payer: Self-pay | Admitting: Cardiology

## 2021-06-10 ENCOUNTER — Other Ambulatory Visit: Payer: Self-pay

## 2021-06-10 DIAGNOSIS — Z79899 Other long term (current) drug therapy: Secondary | ICD-10-CM

## 2021-06-10 DIAGNOSIS — I509 Heart failure, unspecified: Secondary | ICD-10-CM

## 2021-06-10 MED ORDER — TORSEMIDE 20 MG PO TABS: 20.0000 mg | ORAL_TABLET | Freq: Every day | ORAL | 3 refills | Status: DC

## 2021-06-10 NOTE — Telephone Encounter (Signed)
Refill sent to pharmacy.   

## 2021-06-10 NOTE — Progress Notes (Signed)
Orders placed for lab work. 

## 2021-07-11 ENCOUNTER — Encounter: Payer: Self-pay | Admitting: Cardiology

## 2021-07-11 DIAGNOSIS — I10 Essential (primary) hypertension: Secondary | ICD-10-CM

## 2021-07-18 MED ORDER — ROPINIROLE HCL 1 MG PO TABS
1.0000 mg | ORAL_TABLET | Freq: Every evening | ORAL | 2 refills | Status: DC | PRN
Start: 2021-07-18 — End: 2021-07-25

## 2021-07-18 MED ORDER — CARVEDILOL 25 MG PO TABS: 37.5000 mg | ORAL_TABLET | Freq: Two times a day (BID) | ORAL | 3 refills | Status: DC

## 2021-07-20 ENCOUNTER — Other Ambulatory Visit: Payer: Self-pay | Admitting: Cardiology

## 2021-07-25 ENCOUNTER — Encounter: Payer: Self-pay | Admitting: Cardiology

## 2021-07-25 MED ORDER — ROPINIROLE HCL 1 MG PO TABS: 1.0000 mg | ORAL_TABLET | Freq: Every evening | ORAL | 0 refills | Status: DC | PRN

## 2021-07-26 ENCOUNTER — Encounter: Payer: Self-pay | Admitting: Cardiology

## 2021-07-26 MED ORDER — ROPINIROLE HCL 1 MG PO TABS: 1.0000 mg | ORAL_TABLET | Freq: Every evening | ORAL | 0 refills | Status: DC | PRN

## 2021-07-30 ENCOUNTER — Encounter: Payer: Self-pay | Admitting: Cardiology

## 2021-07-31 ENCOUNTER — Ambulatory Visit (INDEPENDENT_AMBULATORY_CARE_PROVIDER_SITE_OTHER): Payer: 59 | Admitting: Cardiology

## 2021-07-31 ENCOUNTER — Other Ambulatory Visit: Payer: Self-pay

## 2021-07-31 ENCOUNTER — Encounter: Payer: Self-pay | Admitting: Cardiology

## 2021-07-31 VITALS — BP 166/114 | HR 90 | Ht 77.0 in | Wt 336.0 lb

## 2021-07-31 DIAGNOSIS — R300 Dysuria: Secondary | ICD-10-CM

## 2021-07-31 DIAGNOSIS — I10 Essential (primary) hypertension: Secondary | ICD-10-CM

## 2021-07-31 DIAGNOSIS — I4819 Other persistent atrial fibrillation: Secondary | ICD-10-CM | POA: Diagnosis not present

## 2021-07-31 DIAGNOSIS — I5032 Chronic diastolic (congestive) heart failure: Secondary | ICD-10-CM

## 2021-07-31 MED ORDER — HYDRALAZINE HCL 25 MG PO TABS: 25.0000 mg | ORAL_TABLET | Freq: Two times a day (BID) | ORAL | 3 refills | Status: DC

## 2021-07-31 NOTE — Patient Instructions (Addendum)
Medication Instructions:   START HYDRALAZINE 25 MG ONE TABLET TWICE DAILY  *If you need a refill on your cardiac medications before your next appointment, please call your pharmacy*  Follow-Up: At [facility], you and your health needs are our priority.  As part of our continuing mission to provide you with exceptional heart care, we have created designated Provider Care Teams.  These Care Teams include your primary Cardiologist (physician) and Advanced Practice Providers (APPs -  Physician Assistants and Nurse Practitioners) who all work together to provide you with the care you need, when you need it.  We recommend signing up for the patient portal called "MyChart".  Sign up information is provided on this After Visit Summary.  MyChart is used to connect with patients for Virtual Visits (Telemedicine).  Patients are able to view lab/test results, encounter notes, upcoming appointments, etc.  Non-urgent messages can be sent to your provider as well.   To learn more about what you can do with MyChart, go to [email or web address].    Your next appointment:   3 month(s)  The format for your next appointment:   In Person  Provider:  Kirk Ruths MD   [place] PA AT [place] 08-07-21 @ 8:30 AM  [place] 08/02/21 @ 10 AM

## 2021-07-31 NOTE — Progress Notes (Signed)
HPI: FU atrial fibrillation. The patient had a cardiac catheterization in 2004 that showed an ejection fraction of 45% and normal coronary arteries. He did have atrial fibrillation at that time and his LV function improved after sinus rhythm was restored. He ultimately had atrial fibrillation ablation. He did well for several years but then recurrent atrial fibrillation. Transesophageal echocardiogram August 2020 showed normal LV function, severe left atrial enlargement, moderate to severe mitral regurgitation and mild tricuspid regurgitation. Patient had cardioversion March 04, 2019 but atrial fibrillation recurred. Patient seen by Dr. Rayann Heman and repeat ablation felt not indicated as chances of maintaining sinus would be lower given obesity and severe left atrial enlargement.  Plan was to potentially consider referral for surgical Maze procedure and mitral valve repair. Follow-up transthoracic echocardiogram October 2020 showed normal LV function, moderate left atrial enlargement and trace mitral regurgitation. Patient subsequently placed on amiodarone in the atrial fibrillation clinic. Also referred for evaluation of sleep apnea. Repeat attempt at cardioversion December 2020 unsuccessful. Dr. Roxy Manns evaluated patient for consideration of surgical maze procedure and felt that medical therapy was best option. Most recent echocardiogram 9/22 showed normal LV function, mild LVH, mild RVE, mild LAE, mild to moderate MR..  Patient was admitted in October with acute on chronic CHF.  He was diuresed with improvement.  Also noted to have urinary tract infection treated with antibiotics.  Since last seen, patient has multiple complaints today.  He has some dyspnea on exertion and occasional pedal edema.  He denies chest pain or syncope.  Patient also complained of frequency of urination, pain in the head of his penis, some incontinence with feces, pain in his legs and depression.  Current Outpatient Medications   Medication Sig Dispense Refill   antiseptic oral rinse (BIOTENE) LIQD 15 mLs by Mouth Rinse route 2 (two) times daily. 237 mL 0   carvedilol (COREG) 25 MG tablet Take 1.5 tablets (37.5 mg total) by mouth 2 (two) times daily. 270 tablet 3   cephALEXin (KEFLEX) 500 MG capsule Take 1 capsule (500 mg total) by mouth 2 (two) times daily. 14 capsule 0   diltiazem (CARDIZEM CD) 360 MG 24 hr capsule TAKE 1 CAPSULE(360 MG) BY MOUTH DAILY 90 capsule 0   DULoxetine (CYMBALTA) 60 MG capsule SMARTSIG:1 Capsule(s) By Mouth Every Evening     ELIQUIS 5 MG TABS tablet TAKE 1 TABLET(5 MG) BY MOUTH TWICE DAILY 60 tablet 11   ferrous sulfate 325 (65 FE) MG tablet Take 1 tablet (325 mg total) by mouth every Monday, Wednesday, and Friday. Take with breakfast 30 tablet 0   gabapentin (NEURONTIN) 100 MG capsule Take 2 capsules (200 mg total) by mouth 3 (three) times daily. 180 capsule 0   losartan (COZAAR) 50 MG tablet Take 1 tablet (50 mg total) by mouth daily. 30 tablet 0   montelukast (SINGULAIR) 10 MG tablet Take 10 mg by mouth daily as needed (sob).      potassium chloride SA (KLOR-CON) 20 MEQ tablet Take 1 tablet (20 mEq total) by mouth daily. 90 tablet 3   rOPINIRole (REQUIP) 1 MG tablet Take 1-2 tablets (1-2 mg total) by mouth at bedtime as needed (restless leg syndrome). 1 tablet after lunch and 2 tablet at bedtime 120 tablet 0   tadalafil (CIALIS) 20 MG tablet Take 20 mg by mouth daily as needed.     torsemide (DEMADEX) 20 MG tablet Take 1 tablet (20 mg total) by mouth daily. 90 tablet 3   No  current facility-administered medications for this visit.     Past Medical History:  Diagnosis Date   Allergic rhinitis    Anxiety    Asthma    as a child   Chronic low back pain    Complication of anesthesia    "hard to put asleep, hard to wake up, nausea and vomiting"   Depression    ED (erectile dysfunction)    GERD (gastroesophageal reflux disease)    HLD (hyperlipidemia)    HTN (hypertension)    sees  Dr. [name], [facility]   Hypertrophy of prostate with urinary obstruction and other lower urinary tract symptoms (LUTS)    IBS (irritable bowel syndrome)    Inguinal hernia without mention of obstruction or gangrene, unilateral or unspecified, (not specified as recurrent)    Insomnia    Lumbar herniated disc    L7   Morbid obesity ([place])    Narcotic addiction ([place])    NICM (nonischemic cardiomyopathy) ([place])    tachycardia mediated,  resolved with sinus   Persistent atrial fibrillation ([place])    ablation done 03/2006 at [facility]   Pneumonia    hx of 2004   PONV (postoperative nausea and vomiting)    Twitching    legs    Past Surgical History:  Procedure Laterality Date   ATRI[facility]  [facility]   CARDIOVERSION  08/11/2011   Procedure: CARDIOVERSION;  Surgeon: Lelon Perla, MD;  Location: [place];  Service: Cardiovascular;  Laterality: N/A;   CARDIOVERSION N/A 04/16/2017   Procedure: CARDIOVERSION;  Surgeon: Sanda Klein, MD;  [facility];  Service: Cardiovascular;  Laterality: N/A;   CARDIOVERSION N/A 03/04/2019   Procedure: CARDIOVERSION;  Surgeon: Lelon Perla, MD;  Location: [facility] ENDOSCOPY;  Service: Cardiovascular;  Laterality: N/A;   CARDIOVERSION N/A 07/04/2019   Procedure: CARDIOVERSION;  Surgeon: [name], MD;  Location: [facility] ENDOSCOPY;  Service: Cardiovascular;  Laterality: N/A;   EYE SURGERY     lasik, [place] Right 08/26/2012   Procedure: LAPAROSCOPIC INGUINAL HERNIA;  Surgeon: Madilyn Hook, DO;  Location: [place];  Service: General;  Laterality: Right;  laparoscopic right inguinal hernia repair with mesh, umbilical hernia repair   INSERTION OF MESH Right 08/26/2012   Procedure: INSERTION OF MESH;  Surgeon: Madilyn Hook, DO;  Location: [place];  Service: General;  Laterality: Right;  right inguinal hernia   TEE WITHOUT CARDIOVERSION N/A 03/04/2019   Procedure: TRANSESOPHAGEAL  ECHOCARDIOGRAM (TEE);  Surgeon: Lelon Perla, MD;  Location: [place];  Service: Cardiovascular;  Laterality: N/A;   UMBILICAL HERNIA REPAIR N/A 08/26/2012   Procedure: HERNIA REPAIR UMBILICAL ADULT;  Surgeon: Madilyn Hook, DO;  [facility];  Service: General;  Laterality: N/A;    Social History   Socioeconomic History   Marital status: Married    Spouse name: Tage Feggins   Number of children: 3   Years of education: Not on file   Highest education level: Bachelor's degree (e.g., BA, AB, BS)  Occupational History   Occupation: retired  Tobacco Use   Smoking status: Never   Smokeless tobacco: Never  Vaping Use   Vaping Use: Never used  Substance and Sexual Activity   Alcohol use: No    Alcohol/week: 0.0 standard drinks   Drug use: No   Sexual activity: Yes  Other Topics Concern   Not on file  Social History Narrative  Lives in [place] Strain: Low Risk    Difficulty of Paying Living Expenses: Not hard at all  Food Insecurity: No Food Insecurity   Worried About Charity fundraiser in the Last Year: Never true   Arboriculturist in the Last Year: Never true  Transportation Needs: No Transportation Needs   Lack of Transportation (Medical): No   Lack of Transportation (Non-Medical): No  Physical Activity: Not on file  Stress: Not on file  Social Connections: Not on file  Intimate Partner Violence: Not on file    Family History  Problem Relation Age of Onset   Asthma Mother    Breast cancer Mother    Hypertension Mother    COPD Father    Prostate cancer Father    Hypertension Father    Lymphoma Sister     ROS: See HPI; no fevers or chills, productive cough, hemoptysis, dysphasia, odynophagia, melena, hematochezia, rash, seizure activity, orthopnea, PND, claudication. Remaining systems are negative.  Physical Exam: Well-developed obese in no acute distress.  Skin is warm and dry.  HEENT is  normal.  Neck is supple.  Chest is clear to auscultation with normal expansion.  Cardiovascular exam is irregular Abdominal exam nontender or distended. No masses palpated. Extremities show trace edema. neuro grossly intact  ECG-atrial fibrillation at a rate of 90, no ST changes.  Personally reviewed  A/P  1 permanent atrial fibrillation-we will continue present dose of Cardizem, carvedilol and apixaban.  2 hypertension-blood pressure elevated today.  It is better at home but still elevated.  Continue present medications and add hydralazine 25 mg twice daily.  Follow blood pressure and advance as needed.  3 chronic diastolic congestive heart failure-patient does have some dyspnea on exertion but does not appear to be markedly volume overloaded on examination.  Continue fluid restriction and low-sodium diet.  Continue Demadex and spironolactone at present dose.  Take an additional 20 mg of Demadex for weight gain of 2 to 3 pounds or worsening lower extremity edema.  Check potassium and renal function in 1 week.  If renal function stable will consider addition of [place].  4 mitral regurgitation-mild to moderate on most recent echocardiogram.  He will need follow-up studies in the future.  5 history of cardiomyopathy-LV function is normal on most recent echocardiogram.  6 obesity-we discussed the importance of weight loss.  7 frequency of urination, pain in the head of his penis-we have arranged an appointment with urology in 2 days.  Note he denies dysuria.  8 depression/pain in his legs-patient is taking multiple Motrin for pain in his legs.  I will ask him to avoid this for now.  We will check hemoglobin and renal function today.  We arranged for him to see his primary care next week for multiple other ongoing issues.  Patient felt better after his visit today and is looking forward to his follow-up visits as above.  Kirk Ruths, MD

## 2021-08-07 ENCOUNTER — Encounter: Payer: Self-pay | Admitting: Cardiology

## 2021-08-07 MED ORDER — ROPINIROLE HCL 1 MG PO TABS: 1.0000 mg | ORAL_TABLET | Freq: Every evening | ORAL | 0 refills | Status: DC | PRN

## 2021-08-15 ENCOUNTER — Encounter: Payer: Self-pay | Admitting: Cardiology

## 2021-08-19 ENCOUNTER — Encounter: Payer: Self-pay | Admitting: Cardiology

## 2021-08-19 DIAGNOSIS — I509 Heart failure, unspecified: Secondary | ICD-10-CM

## 2021-08-20 MED ORDER — TORSEMIDE 20 MG PO TABS: 20.0000 mg | ORAL_TABLET | Freq: Two times a day (BID) | ORAL | 3 refills | Status: DC

## 2021-08-21 ENCOUNTER — Encounter: Payer: Self-pay | Admitting: Cardiology

## 2021-09-01 ENCOUNTER — Other Ambulatory Visit: Payer: Self-pay | Admitting: Cardiology

## 2021-09-02 NOTE — Telephone Encounter (Signed)
Prescription refill request for Eliquis received. Indication:Afib Last office visit:1/23 Scr:1.4 Age: 66 Weight:152.4 kg  Prescription refilled

## 2021-09-16 ENCOUNTER — Telehealth: Payer: Self-pay

## 2021-09-16 ENCOUNTER — Other Ambulatory Visit: Payer: Self-pay

## 2021-09-16 ENCOUNTER — Encounter: Payer: Self-pay | Admitting: Cardiology

## 2021-09-16 DIAGNOSIS — Z79899 Other long term (current) drug therapy: Secondary | ICD-10-CM

## 2021-09-16 DIAGNOSIS — I509 Heart failure, unspecified: Secondary | ICD-10-CM

## 2021-09-16 MED ORDER — TORSEMIDE 20 MG PO TABS: 20.0000 mg | ORAL_TABLET | Freq: Two times a day (BID) | ORAL | 3 refills | Status: DC

## 2021-09-16 NOTE — Telephone Encounter (Signed)
Patient came to clinic instead of calling back. Refilled torsemide and ordered BMET. Patient showed me his edematous legs. He does not wear support hose, but elevates his legs at home. He will get lab work tomorrow. Patient then stated his BP was 178/100, so he has doubled his dose of carvedilol. Informed him that I will forward this information to Dr. Stanford Breed ?

## 2021-09-16 NOTE — Telephone Encounter (Signed)
Please see telephone encounter

## 2021-10-23 NOTE — Progress Notes (Signed)
? ? ? ? ?HPI: FU atrial fibrillation. The patient had a cardiac catheterization in 2004 that showed an ejection fraction of 45% and normal coronary arteries. He did have atrial fibrillation at that time and his LV function improved after sinus rhythm was restored. He ultimately had atrial fibrillation ablation. He did well for several years but then recurrent atrial fibrillation. Transesophageal echocardiogram August 2020 showed normal LV function, severe left atrial enlargement, moderate to severe mitral regurgitation and mild tricuspid regurgitation. Patient had cardioversion March 04, 2019 but atrial fibrillation recurred. Patient seen by Dr. Rayann Heman and repeat ablation felt not indicated as chances of maintaining sinus would be lower given obesity and severe left atrial enlargement.  Plan was to potentially consider referral for surgical Maze procedure and mitral valve repair. Follow-up transthoracic echocardiogram October 2020 showed normal LV function, moderate left atrial enlargement and trace mitral regurgitation. Patient subsequently placed on amiodarone in the atrial fibrillation clinic. Also referred for evaluation of sleep apnea. Repeat attempt at cardioversion December 2020 unsuccessful. Dr. Roxy Manns evaluated patient for consideration of surgical maze procedure and felt that medical therapy was best option. Most recent echocardiogram 9/22 showed normal LV function, mild LVH, mild RVE, mild LAE, mild to moderate MR. Since last seen, patient ran out of his Demadex 5 days ago.  He has been taking carvedilol 75 mg twice daily.  He describes dyspnea on exertion since running out of his Demadex.  No orthopnea but he has had pedal edema.  No chest pain or syncope. ? ?Current Outpatient Medications  ?Medication Sig Dispense Refill  ? antiseptic oral rinse (BIOTENE) LIQD 15 mLs by Mouth Rinse route 2 (two) times daily. 237 mL 0  ? carvedilol (COREG) 25 MG tablet Take 1.5 tablets (37.5 mg total) by mouth 2 (two)  times daily. 270 tablet 3  ? cephALEXin (KEFLEX) 500 MG capsule Take 1 capsule (500 mg total) by mouth 2 (two) times daily. 14 capsule 0  ? diltiazem (CARDIZEM CD) 360 MG 24 hr capsule TAKE 1 CAPSULE(360 MG) BY MOUTH DAILY 90 capsule 0  ? DULoxetine (CYMBALTA) 60 MG capsule SMARTSIG:1 Capsule(s) By Mouth Every Evening    ? ELIQUIS 5 MG TABS tablet TAKE 1 TABLET(5 MG) BY MOUTH TWICE DAILY 60 tablet 11  ? ferrous sulfate 325 (65 FE) MG tablet Take 1 tablet (325 mg total) by mouth every Monday, Wednesday, and Friday. Take with breakfast 30 tablet 0  ? gabapentin (NEURONTIN) 100 MG capsule Take 2 capsules (200 mg total) by mouth 3 (three) times daily. 180 capsule 0  ? hydrALAZINE (APRESOLINE) 25 MG tablet Take 1 tablet (25 mg total) by mouth 2 (two) times daily. 180 tablet 3  ? losartan (COZAAR) 50 MG tablet Take 1 tablet (50 mg total) by mouth daily. 30 tablet 0  ? montelukast (SINGULAIR) 10 MG tablet Take 10 mg by mouth daily as needed (sob).     ? potassium chloride SA (KLOR-CON) 20 MEQ tablet Take 1 tablet (20 mEq total) by mouth daily. 90 tablet 3  ? rOPINIRole (REQUIP) 1 MG tablet Take 1-2 tablets (1-2 mg total) by mouth at bedtime as needed (restless leg syndrome). 1 tablet after lunch and 2 tablet at bedtime 120 tablet 0  ? tadalafil (CIALIS) 20 MG tablet Take 20 mg by mouth daily as needed.    ? torsemide (DEMADEX) 20 MG tablet Take 1 tablet (20 mg total) by mouth 2 (two) times daily. 180 tablet 3  ? ?No current facility-administered medications for this visit.  ? ? ? ?  Past Medical History:  ?Diagnosis Date  ? Allergic rhinitis   ? Anxiety   ? Asthma   ? as a child  ? Chronic low back pain   ? Complication of anesthesia   ? "hard to put asleep, hard to wake up, nausea and vomiting"  ? Depression   ? ED (erectile dysfunction)   ? GERD (gastroesophageal reflux disease)   ? HLD (hyperlipidemia)   ? HTN (hypertension)   ? sees Dr. [name], [facility]  ? Hypertrophy of prostate with urinary obstruction  and other lower urinary tract symptoms (LUTS)   ? IBS (irritable bowel syndrome)   ? Inguinal hernia without mention of obstruction or gangrene, unilateral or unspecified, (not specified as recurrent)   ? Insomnia   ? Lumbar herniated disc   ? L7  ? Morbid obesity ([place])   ? Narcotic addiction ([place])   ? NICM (nonischemic cardiomyopathy) ([place])   ? tachycardia mediated,  resolved with sinus  ? Persistent atrial fibrillation ([place])   ? ablation done 03/[facility]  ? Pneumonia   ? hx of 2004  ? PONV (postoperative nausea and vomiting)   ? Twitching   ? legs  ? ? ?Past Surgical History:  ?Procedure Laterality Date  ? ATRIAL ABLATION SURGERY  2007  ? [facility]  ? CARDIOVERSION  08/11/2011  ? Procedure: CARDIOVERSION;  Surgeon: Lelon Perla, MD;  Location: [place];  Service: Cardiovascular;  Laterality: N/A;  ? CARDIOVERSION N/A 04/16/2017  ? Procedure: CARDIOVERSION;  Surgeon: Sanda Klein, MD;  Location: [place];  Service: Cardiovascular;  Laterality: N/A;  ? CARDIOVERSION N/A 03/04/2019  ? Procedure: CARDIOVERSION;  Surgeon: Lelon Perla, MD;  Location[facility] ENDOSCOPY;  Service: Cardiovascular;  Laterality: N/A;  ? CARDIOVERSION N/A 07/04/2019  ? Procedure: CARDIOVERSION;  Surgeon: [name], MD;  Location: [place];  Service: Cardiovascular;  Laterality: N/A;  ? EYE SURGERY    ? lasik, 1998  ? HERNIA REPAIR    ? 1977  ? INGUINAL HERNIA REPAIR Right 08/26/2012  ? Procedure: LAPAROSCOPIC INGUINAL HERNIA;  Surgeon: Madilyn Hook, DO;  Location: [place];  Service: General;  Laterality: Right;  laparoscopic right inguinal hernia repair with mesh, umbilical hernia repair  ? INSERTION OF MESH Right 08/26/2012  ? Procedure: INSERTION OF MESH;  Surgeon: Madilyn Hook, DO;  Location: [place];  Service: General;  Laterality: Right;  right inguinal hernia  ? TEE WITHOUT CARDIOVERSION N/A 03/04/2019  ? Procedure: TRANSESOPHAGEAL ECHOCARDIOGRAM (TEE);  Surgeon: Lelon Perla, MD;  Location: [place];  Service:  Cardiovascular;  Laterality: N/A;  ? UMBILICAL HERNIA REPAIR N/A 08/26/2012  ? Procedure: HERNIA REPAIR UMBILICAL ADULT;  Surgeon: Madilyn Hook, DO;  Location: [place];  Service: General;  Laterality: N/A;  ? ? ?Social History  ? ?Socioeconomic History  ? Marital status: Married  ?  Spouse name: Zan Orlick  ? Number of children: 3  ? Years of education: Not on file  ? Highest education level: Bachelor's degree (e.g., BA, AB, BS)  ?Occupational History  ? Occupation: retired  ?Tobacco Use  ? Smoking status: Never  ? Smokeless tobacco: Never  ?Vaping Use  ? Vaping Use: Never used  ?Substance and Sexual Activity  ? Alcohol use: No  ?  Alcohol/week: 0.0 standard drinks  ? Drug use: No  ? Sexual activity: Yes  ?Other Topics Concern  ? Not on file  ?Social History Narrative  ? Lives in [place]  ? ?Social Determinants of Health  ? ?  Financial Resource Strain: Low Risk   ? Difficulty of Paying Living Expenses: Not hard at all  ?Food Insecurity: No Food Insecurity  ? Worried About Charity fundraiser in the Last Year: Never true  ? Ran Out of Food in the Last Year: Never true  ?Transportation Needs: No Transportation Needs  ? Lack of Transportation (Medical): No  ? Lack of Transportation (Non-Medical): No  ?Physical Activity: Not on file  ?Stress: Not on file  ?Social Connections: Not on file  ?Intimate Partner Violence: Not on file  ? ? ?Family History  ?Problem Relation Age of Onset  ? Asthma Mother   ? Breast cancer Mother   ? Hypertension Mother   ? COPD Father   ? Prostate cancer Father   ? Hypertension Father   ? Lymphoma Sister   ? ? ?ROS: no fevers or chills, productive cough, hemoptysis, dysphasia, odynophagia, melena, hematochezia, dysuria, hematuria, rash, seizure activity, orthopnea, PND, pedal edema, claudication. Remaining systems are negative. ? ?Physical Exam: ?Well-developed obese in no acute distress.  ?Skin is warm and dry.  ?HEENT is normal.  ?Neck is supple.  ?Chest is clear to auscultation with  normal expansion.  ?Cardiovascular exam is irregular ?Abdominal exam nontender or distended. No masses palpated. ?Extremities show no edema. ?neuro grossly intact ? ?ECG-atrial fibrillation at a rate of 54, RV cond

## 2021-10-27 ENCOUNTER — Other Ambulatory Visit: Payer: Self-pay | Admitting: Cardiology

## 2021-10-28 ENCOUNTER — Ambulatory Visit (INDEPENDENT_AMBULATORY_CARE_PROVIDER_SITE_OTHER): Payer: 59 | Admitting: Cardiology

## 2021-10-28 ENCOUNTER — Encounter: Payer: Self-pay | Admitting: Cardiology

## 2021-10-28 VITALS — BP 122/90 | HR 54 | Ht 77.0 in

## 2021-10-28 DIAGNOSIS — I509 Heart failure, unspecified: Secondary | ICD-10-CM | POA: Diagnosis not present

## 2021-10-28 DIAGNOSIS — I10 Essential (primary) hypertension: Secondary | ICD-10-CM

## 2021-10-28 DIAGNOSIS — I4819 Other persistent atrial fibrillation: Secondary | ICD-10-CM | POA: Diagnosis not present

## 2021-10-28 DIAGNOSIS — I5032 Chronic diastolic (congestive) heart failure: Secondary | ICD-10-CM

## 2021-10-28 MED ORDER — HYDRALAZINE HCL 50 MG PO TABS: 50.0000 mg | ORAL_TABLET | Freq: Three times a day (TID) | ORAL | 3 refills | Status: DC

## 2021-10-28 MED ORDER — CARVEDILOL 25 MG PO TABS: 50.0000 mg | ORAL_TABLET | Freq: Two times a day (BID) | ORAL | 3 refills | Status: DC

## 2021-10-28 MED ORDER — TORSEMIDE 20 MG PO TABS: 60.0000 mg | ORAL_TABLET | Freq: Two times a day (BID) | ORAL | 3 refills | Status: DC

## 2021-10-28 NOTE — Patient Instructions (Signed)
Medication Instructions:  ? ?INCREASE CARVEDILOL TO 50 MG TWICE DAILY= 2 OF THE 25 MG TABLETS TWICE DAILY ? ?INCREASE HYDRALAZINE TO 50 MG THREE TIMES DAILY= 2 OF THE 25 MG TABLETS THREE TIMES DAILY ? ?*If you need a refill on your cardiac medications before your next appointment, please call your pharmacy* ? ? ?Lab Work: ? ?Your physician recommends that you return for lab work in: ONE WEEK-DO NOT NEED TO FAST ? ?If you have labs (blood work) drawn today and your tests are completely normal, you will receive your results only by: ?MyChart Message (if you have MyChart) OR ?A paper copy in the mail ?If you have any lab test that is abnormal or we need to change your treatment, we will call you to review the results. ? ? ?Follow-Up: ?At [facility], you and your health needs are our priority.  As part of our continuing mission to provide you with exceptional heart care, we have created designated Provider Care Teams.  These Care Teams include your primary Cardiologist (physician) and Advanced Practice Providers (APPs -  Physician Assistants and Nurse Practitioners) who all work together to provide you with the care you need, when you need it. ? ?We recommend signing up for the patient portal called "MyChart".  Sign up information is provided on this After Visit Summary.  MyChart is used to connect with patients for Virtual Visits (Telemedicine).  Patients are able to view lab/test results, encounter notes, upcoming appointments, etc.  Non-urgent messages can be sent to your provider as well.   ?To learn more about what you can do with MyChart, go to [email or web address].   ? ?Your next appointment:   ?3 month(s) ? ?The format for your next appointment:   ?In Person ? ?Provider:   ?Kirk Ruths, MD  ? ? ? ? ?Important Information About Sugar ? ? ? ? ?  ?

## 2021-11-08 ENCOUNTER — Encounter: Payer: Self-pay | Admitting: Cardiology

## 2021-11-08 DIAGNOSIS — I509 Heart failure, unspecified: Secondary | ICD-10-CM

## 2021-11-11 ENCOUNTER — Encounter: Payer: Self-pay | Admitting: Cardiology

## 2021-11-12 NOTE — Telephone Encounter (Signed)
Per OV note on 10/28/21 by Crenshaw: ? ?3 chronic diastolic congestive heart failure-patient notes increased dyspnea over the past 5 days as he has run out of his Demadex.  Will resume at 60 mg daily which was what he was taking at home.  Continue fluid restriction and low-sodium diet. I have not added [place] or Jardiance due to his ongoing difficulties with urinary tract infections. ? ?Will route to [facility] for advisement on switching diuretic and possible nystatin/fluconazole for oral thrush ?

## 2021-11-28 ENCOUNTER — Encounter: Payer: Self-pay | Admitting: *Deleted

## 2021-12-04 NOTE — Progress Notes (Signed)
HPI: FU atrial fibrillation. The patient had a cardiac catheterization in 2004 that showed an ejection fraction of 45% and normal coronary arteries. He did have atrial fibrillation at that time and his LV function improved after sinus rhythm was restored. He ultimately had atrial fibrillation ablation. He did well for several years but then recurrent atrial fibrillation. Transesophageal echocardiogram August 2020 showed normal LV function, severe left atrial enlargement, moderate to severe mitral regurgitation and mild tricuspid regurgitation. Patient had cardioversion March 04, 2019 but atrial fibrillation recurred. Patient seen by Dr. Rayann Heman and repeat ablation felt not indicated as chances of maintaining sinus would be lower given obesity and severe left atrial enlargement.  Plan was to potentially consider referral for surgical Maze procedure and mitral valve repair. Follow-up transthoracic echocardiogram October 2020 showed normal LV function, moderate left atrial enlargement and trace mitral regurgitation. Patient subsequently placed on amiodarone in the atrial fibrillation clinic. Also referred for evaluation of sleep apnea. Repeat attempt at cardioversion December 2020 unsuccessful. Dr. Roxy Manns evaluated patient for consideration of surgical maze procedure and felt that medical therapy was best option. Most recent echocardiogram 9/22 showed normal LV function, mild LVH, mild RVE, mild LAE, mild to moderate MR. Patient has not had repeat blood work drawn since October despite multiple orders.  He is taking multiple Demadex as he states "I am not peeing".  Since last seen, he does have some increased dyspnea on exertion and orthopnea.  He denies PND.  He has bilateral lower extremity edema worse during the day and improved overnight.  He denies chest pain or syncope.  Current Outpatient Medications  Medication Sig Dispense Refill   antiseptic oral rinse (BIOTENE) LIQD 15 mLs by Mouth Rinse route 2  (two) times daily. 237 mL 0   carvedilol (COREG) 25 MG tablet Take 2 tablets (50 mg total) by mouth 2 (two) times daily. 360 tablet 3   cephALEXin (KEFLEX) 500 MG capsule Take 1 capsule (500 mg total) by mouth 2 (two) times daily. 14 capsule 0   diltiazem (CARDIZEM CD) 360 MG 24 hr capsule TAKE 1 CAPSULE(360 MG) BY MOUTH DAILY 90 capsule 0   DULoxetine (CYMBALTA) 60 MG capsule SMARTSIG:1 Capsule(s) By Mouth Every Evening     ELIQUIS 5 MG TABS tablet TAKE 1 TABLET(5 MG) BY MOUTH TWICE DAILY 60 tablet 11   ferrous sulfate 325 (65 FE) MG tablet Take 1 tablet (325 mg total) by mouth every Monday, Wednesday, and Friday. Take with breakfast 30 tablet 0   gabapentin (NEURONTIN) 100 MG capsule Take 2 capsules (200 mg total) by mouth 3 (three) times daily. 180 capsule 0   hydrALAZINE (APRESOLINE) 50 MG tablet Take 1 tablet (50 mg total) by mouth 3 (three) times daily. 270 tablet 3   montelukast (SINGULAIR) 10 MG tablet Take 10 mg by mouth daily as needed (sob).      potassium chloride SA (KLOR-CON) 20 MEQ tablet Take 1 tablet (20 mEq total) by mouth daily. 90 tablet 3   rOPINIRole (REQUIP) 1 MG tablet Take 1-2 tablets (1-2 mg total) by mouth at bedtime as needed (restless leg syndrome). 1 tablet after lunch and 2 tablet at bedtime 120 tablet 0   tadalafil (CIALIS) 20 MG tablet Take 20 mg by mouth daily as needed.     torsemide (DEMADEX) 20 MG tablet Take 3 tablets (60 mg total) by mouth 2 (two) times daily. 540 tablet 3   No current facility-administered medications for this visit.  Past Medical History:  Diagnosis Date   Allergic rhinitis    Anxiety    Asthma    as a child   Chronic low back pain    Complication of anesthesia    "hard to put asleep, hard to wake up, nausea and vomiting"   Depression    ED (erectile dysfunction)    GERD (gastroesophageal reflux disease)    HLD (hyperlipidemia)    HTN (hypertension)    sees Dr. [name], [facility]   Hypertrophy of prostate  with urinary obstruction and other lower urinary tract symptoms (LUTS)    IBS (irritable bowel syndrome)    Inguinal hernia without mention of obstruction or gangrene, unilateral or unspecified, (not specified as recurrent)    Insomnia    Lumbar herniated disc    L7   Morbid obesity ([place])    Narcotic addiction ([place])    NICM (nonischemic cardiomyopathy) ([place])    tachycardia mediated,  resolved with sinus   Persistent atrial fibrillation ([place])    ablation done 03/2006 at [facility]   Pneumonia    hx of 2004   PONV (postoperative nausea and vomiting)    Twitching    legs    Past Surgical History:  Procedure Laterality Date   ATRIAL ABLATION SURGERY  2007   [facility]   CARDIOVERSION  08/11/2011   Procedure: CARDIOVERSION;  Surgeon: Lelon Perla, MD;  Location: [place];  Service: Cardiovascular;  Laterality: N/A;   CARDIOVERSION N/A 04/16/2017   Procedure: CARDIOVERSION;  Surgeon: Sanda Klein, MD;  Location: [facility] ENDOSCOPY;  Service: Cardiovascular;  Laterality: N/A;   CARDIOVERSION N/A 03/04/2019   Procedure: CARDIOVERSION;  Surgeon: Lelon Perla, MD;  Location[facility] ENDOSCOPY;  Service: Cardiovascular;  Laterality: N/A;   CARDIOVERSION N/A 07/04/2019   Procedure: CARDIOVERSION;  Surgeon: [name], MD;  Location: [facility] ENDOSCOPY;  Service: Cardiovascular;  Laterality: N/A;   EYE SURGERY     lasik, [place] Right 08/26/2012   Procedure: LAPAROSCOPIC INGUINAL HERNIA;  Surgeon: Madilyn Hook, DO;  Location: [place];  Service: General;  Laterality: Right;  laparoscopic right inguinal hernia repair with mesh, umbilical hernia repair   INSERTION OF MESH Right 08/26/2012   Procedure: INSERTION OF MESH;  Surgeon: Madilyn Hook, DO;  Location: [place];  Service: General;  Laterality: Right;  right inguinal hernia   TEE WITHOUT CARDIOVERSION N/A 03/04/2019   Procedure: TRANSESOPHAGEAL ECHOCARDIOGRAM (TEE);  Surgeon: Lelon Perla, MD;  Location: [place];  Service: Cardiovascular;  Laterality: N/A;   UMBILICAL HERNIA REPAIR N/A 08/26/2012   Procedure: HERNIA REPAIR UMBILICAL ADULT;  Surgeon: Madilyn Hook, DO;  Location: [facility] OR;  Service: General;  Laterality: N/A;    Social History   Socioeconomic History   Marital status: Married    Spouse name: Zarek Relph   Number of children: 3   Years of education: Not on file   Highest education level: Bachelor's degree (e.g., BA, AB, BS)  Occupational History   Occupation: retired  Tobacco Use   Smoking status: Never   Smokeless tobacco: Never  Vaping Use   Vaping Use: Never used  Substance and Sexual Activity   Alcohol use: No    Alcohol/week: 0.0 standard drinks   Drug use: No   Sexual activity: Yes  Other Topics Concern   Not on file  Social History Narrative   Lives in [place]  Financial Resource Strain: Low Risk    Difficulty of Paying Living Expenses: Not hard at all  Food Insecurity: No Food Insecurity   Worried About Charity fundraiser in the Last Year: Never true   Ran Out of Food in the Last Year: Never true  Transportation Needs: No Transportation Needs   Lack of Transportation (Medical): No   Lack of Transportation (Non-Medical): No  Physical Activity: Not on file  Stress: Not on file  Social Connections: Not on file  Intimate Partner Violence: Not on file    Family History  Problem Relation Age of Onset   Asthma Mother    Breast cancer Mother    Hypertension Mother    COPD Father    Prostate cancer Father    Hypertension Father    Lymphoma Sister     ROS: no fevers or chills, productive cough, hemoptysis, dysphasia, odynophagia, melena, hematochezia, dysuria, hematuria, rash, seizure activity, orthopnea, PND, claudication. Remaining systems are negative.  Physical Exam: Well-developed obese in no acute distress.  Skin is warm and dry.  HEENT is normal.  Neck is supple.  Chest is clear to auscultation  with normal expansion.  Cardiovascular exam is irregular Abdominal exam nontender or distended. No masses palpated. Extremities show 2+ edema. neuro grossly intact  ECG- Atrial fibrillation, normal axis, occasional PVC or aberrantly conducted beat.  Personally reviewed  A/P  1 permanent atrial fibrillation-we will continue carvedilol and Cardizem at present dose for rate control.  Continue apixaban.  Check hemoglobin and renal function.  2 chronic diastolic congestive heart failure-patient is volume overloaded on examination today.  I had a long discussion with him concerning compliance with my instructions, medications and having lab work performed.  He is taking additional Demadex without my advice.  He has had no blood work drawn since October.  I explained that I cannot adequately care for him if he does not have blood work drawn.  We will check potassium, renal function and BNP today.  If his renal function is stable I will consider adding metolazone to his medical regimen.  I explained that he must have follow-up blood work if we do that and he understands.  3 hypertension-mildly elevated but he states it is controlled at home.  We will continue present medications and follow.  4 mitral regurgitation-mild to moderate on most recent echocardiogram.  He will need follow-up studies in the future.  5 cardiomyopathy-LV function normalized on most recent echocardiogram.  6 obesity-we again discussed the importance of weight loss.  I also instructed him that he should have his sleep apnea treated.  Kirk Ruths, MD

## 2021-12-11 ENCOUNTER — Encounter: Payer: Self-pay | Admitting: Cardiology

## 2021-12-11 ENCOUNTER — Ambulatory Visit: Payer: 59 | Admitting: Cardiology

## 2021-12-11 VITALS — BP 148/86 | Ht 77.0 in | Wt 327.0 lb

## 2021-12-11 DIAGNOSIS — I4821 Permanent atrial fibrillation: Secondary | ICD-10-CM | POA: Diagnosis not present

## 2021-12-11 DIAGNOSIS — I10 Essential (primary) hypertension: Secondary | ICD-10-CM | POA: Diagnosis not present

## 2021-12-11 DIAGNOSIS — I5032 Chronic diastolic (congestive) heart failure: Secondary | ICD-10-CM | POA: Diagnosis not present

## 2021-12-11 DIAGNOSIS — I340 Nonrheumatic mitral (valve) insufficiency: Secondary | ICD-10-CM

## 2021-12-11 NOTE — Patient Instructions (Signed)
  Follow-Up: At [facility], you and your health needs are our priority.  As part of our continuing mission to provide you with exceptional heart care, we have created designated Provider Care Teams.  These Care Teams include your primary Cardiologist (physician) and Advanced Practice Providers (APPs -  Physician Assistants and Nurse Practitioners) who all work together to provide you with the care you need, when you need it.  We recommend signing up for the patient portal called "MyChart".  Sign up information is provided on this After Visit Summary.  MyChart is used to connect with patients for Virtual Visits (Telemedicine).  Patients are able to view lab/test results, encounter notes, upcoming appointments, etc.  Non-urgent messages can be sent to your provider as well.   To learn more about what you can do with MyChart, go to [email or web address].    Your next appointment:   3 month(s)  The format for your next appointment:   In Person  Provider:   Brian Crenshaw, MD     Important Information About Sugar       

## 2021-12-12 ENCOUNTER — Encounter: Payer: Self-pay | Admitting: Cardiology

## 2021-12-12 ENCOUNTER — Other Ambulatory Visit: Payer: Self-pay | Admitting: *Deleted

## 2021-12-12 DIAGNOSIS — N289 Disorder of kidney and ureter, unspecified: Secondary | ICD-10-CM

## 2021-12-12 LAB — CBC
HCT: 41.7 % (ref 38.5–50.0)
Hemoglobin: 14.2 g/dL (ref 13.2–17.1)
MCH: 31.3 pg (ref 27.0–33.0)
MCHC: 34.1 g/dL (ref 32.0–36.0)
MCV: 92.1 fL (ref 80.0–100.0)
MPV: 11.2 fL (ref 7.5–12.5)
Platelets: 249 10*3/uL (ref 140–400)
RBC: 4.53 10*6/uL (ref 4.20–5.80)
RDW: 12.2 % (ref 11.0–15.0)
WBC: 8.1 10*3/uL (ref 3.8–10.8)

## 2021-12-12 LAB — BASIC METABOLIC PANEL
BUN/Creatinine Ratio: 17 (calc) (ref 6–22)
BUN: 35 mg/dL — ABNORMAL HIGH (ref 7–25)
CO2: 32 mmol/L (ref 20–32)
Calcium: 9.2 mg/dL (ref 8.6–10.3)
Chloride: 102 mmol/L (ref 98–110)
Creat: 2.05 mg/dL — ABNORMAL HIGH (ref 0.70–1.35)
Glucose, Bld: 106 mg/dL — ABNORMAL HIGH (ref 65–99)
Potassium: 3.7 mmol/L (ref 3.5–5.3)
Sodium: 142 mmol/L (ref 135–146)

## 2021-12-12 LAB — BRAIN NATRIURETIC PEPTIDE: Brain Natriuretic Peptide: 54 pg/mL (ref <100)

## 2021-12-12 NOTE — Telephone Encounter (Signed)
  Per MyChart scheduling message:   Most of all things except glucose I was in preferred range what do we do now

## 2021-12-12 NOTE — Telephone Encounter (Signed)
Attempted to call pt. No answer, voicemail full unable to leave a message at this time.

## 2021-12-12 NOTE — Telephone Encounter (Signed)
This encounter was created in error - please disregard.

## 2022-01-16 ENCOUNTER — Ambulatory Visit (HOSPITAL_BASED_OUTPATIENT_CLINIC_OR_DEPARTMENT_OTHER)
Admission: RE | Admit: 2022-01-16 | Discharge: 2022-01-16 | Disposition: A | Discharge status: Home / Self Care | Payer: 59 | Source: Ambulatory Visit | Attending: Family Medicine | Admitting: Family Medicine

## 2022-01-16 ENCOUNTER — Other Ambulatory Visit (HOSPITAL_BASED_OUTPATIENT_CLINIC_OR_DEPARTMENT_OTHER): Payer: Self-pay | Admitting: Family Medicine

## 2022-01-16 DIAGNOSIS — M7989 Other specified soft tissue disorders: Secondary | ICD-10-CM | POA: Insufficient documentation

## 2022-02-11 ENCOUNTER — Encounter: Payer: Self-pay | Admitting: *Deleted

## 2022-02-24 ENCOUNTER — Encounter (HOSPITAL_BASED_OUTPATIENT_CLINIC_OR_DEPARTMENT_OTHER): Payer: 59 | Attending: General Surgery | Admitting: General Surgery

## 2022-02-24 DIAGNOSIS — I4891 Unspecified atrial fibrillation: Secondary | ICD-10-CM | POA: Insufficient documentation

## 2022-02-24 DIAGNOSIS — I872 Venous insufficiency (chronic) (peripheral): Secondary | ICD-10-CM | POA: Diagnosis not present

## 2022-02-24 DIAGNOSIS — I5022 Chronic systolic (congestive) heart failure: Secondary | ICD-10-CM | POA: Insufficient documentation

## 2022-02-24 DIAGNOSIS — I110 Hypertensive heart disease with heart failure: Secondary | ICD-10-CM | POA: Diagnosis not present

## 2022-02-24 DIAGNOSIS — L97822 Non-pressure chronic ulcer of other part of left lower leg with fat layer exposed: Secondary | ICD-10-CM | POA: Insufficient documentation

## 2022-02-24 DIAGNOSIS — R600 Localized edema: Secondary | ICD-10-CM | POA: Insufficient documentation

## 2022-02-24 DIAGNOSIS — Z6839 Body mass index (BMI) 39.0-39.9, adult: Secondary | ICD-10-CM | POA: Diagnosis not present

## 2022-02-24 DIAGNOSIS — I420 Dilated cardiomyopathy: Secondary | ICD-10-CM | POA: Insufficient documentation

## 2022-02-24 NOTE — Progress Notes (Signed)
JASN, XIA ([id number]) Visit Report for 02/24/2022 Abuse Risk Screen Details Patient Name: Date of Service: [name] RD O. 02/24/2022 9:00 A M Medical Record Number: [id number] Patient Account Number: [id number] Date of Birth/Sex: Treating RN: March 04, 1956 (66 y.o. [name] Primary Care Cassady Stanczak: [name] Other Clinician: Referring Kaylor Maiers: Treating Lakeem Rozo/Extender: [name] in Treatment: 0 Abuse Risk Screen Items Answer ABUSE RISK SCREEN: Has anyone close to you tried to hurt or harm you recentlyo No Do you feel uncomfortable with anyone in your familyo No Has anyone forced you do things that you didnt want to doo No Electronic Signature(s) Signed: 02/24/2022 5:22:00 PM By: Blanche East RN Entered By: Blanche East on 02/24/2022 09:04:12 -------------------------------------------------------------------------------- Activities of Daily Living Details Patient Name: Date of Service: [name] RD O. 02/24/2022 9:00 A M Medical Record Number: [id number] Patient Account Number: [id number] Date of Birth/Sex: Treating RN: 1956-02-06 (66 y.o. [name] Primary Care Bindu Docter: [name] Other Clinician: Referring Emmilee Reamer: Treating Filbert Craze/Extender: [name] in Treatment: 0 Activities of Daily Living Items Answer Activities of Daily Living (Please select one for each item) Drive Automobile Completely Able T Medications ake Completely Able Use T elephone Completely Able Care for Appearance Completely Able Use T oilet Completely Able Bath / Shower Completely Able Dress Self Completely Able Feed Self Completely Able Walk Completely Able Get In / Out Bed Completely Able Housework Completely Able Prepare Meals Completely [place] for Self Completely Able Electronic Signature(s) Signed: 02/24/2022 5:22:00 PM By: Blanche East RN Entered By: Blanche East on 02/24/2022 09:04:45 -------------------------------------------------------------------------------- Education Screening Details Patient Name: Date of Service: [name] RD O. 02/24/2022 9:00 A M Medical Record Number: [id number] Patient Account Number: [id number] Date of Birth/Sex: Treating RN: 09/09/1955 (66 y.o. [name] Primary Care Axie Hayne: [name] Other Clinician: Referring Dashun Borre: Treating Rosabel Sermeno/Extender: [name] in Treatment: 0 Learning Preferences/Education Level/Primary Language Learning Preference: Explanation Highest Education Level: College or Above Preferred Language: English Cognitive Barrier Language Barrier: No Translator Needed: No Memory Deficit: No Emotional Barrier: No Cultural/Religious Beliefs Affecting Medical Care: No Physical Barrier Impaired Vision: No Impaired Hearing: No Decreased Hand dexterity: No Knowledge/Comprehension Knowledge Level: High Comprehension Level: High Ability to understand written instructions: High Ability to understand verbal instructions: High Motivation Anxiety Level: Calm Cooperation: Cooperative Education Importance: Acknowledges Need Interest in Health Problems: Asks Questions Perception: Coherent Willingness to Engage in Self-Management High Activities: Readiness to Engage in Self-Management High Activities: Electronic Signature(s) Signed: 02/24/2022 5:22:00 PM By: Blanche East RN Entered By: Blanche East on 02/24/2022 09:05:16 -------------------------------------------------------------------------------- Fall Risk Assessment Details Patient Name: Date of Service: [name] RD O. 02/24/2022 9:00 A M Medical Record Number: [id number] Patient Account Number: [id number] Date of Birth/Sex: Treating RN: 1956-03-12 (66 y.o. [name] Primary Care Belvie Iribe: [name] Other Clinician: Referring Lesta Limbert: Treating  Miraj Truss/Extender: [name] in Treatment: 0 Fall Risk Assessment Items Have you had 2 or more falls in the last 12 monthso 0 No Have you had any fall that resulted in injury in the last 12 monthso 0 No FALLS RISK SCREEN History of falling - immediate or within 3 months 0 No Secondary diagnosis (Do you have 2 or more medical diagnoseso) 0 No Ambulatory aid None/bed rest/wheelchair/nurse 0 No Crutches/cane/walker 0 No Furniture 0 No Intravenous therapy Access/Saline/Heparin Lock 0 No Gait/Transferring Normal/ bed rest/ wheelchair 0 No Weak (  short steps with or without shuffle, stooped but able to lift head while walking, may seek 0 No support from furniture) Impaired (short steps with shuffle, may have difficulty arising from chair, head down, impaired 0 No balance) Mental Status Oriented to own ability 0 Yes Electronic Signature(s) Signed: 02/24/2022 5:22:00 PM By: Blanche East RN Entered By: Blanche East on 02/24/2022 09:06:53 -------------------------------------------------------------------------------- Foot Assessment Details Patient Name: Date of Service: [name] RD O. 02/24/2022 9:00 A M Medical Record Number: [id number] Patient Account Number: [id number] Date of Birth/Sex: Treating RN: September 02, 1955 (66 y.o. [name] Primary Care Amiah Frohlich: [name] Other Clinician: Referring Ules Marsala: Treating Izumi Mixon/Extender: [name] in Treatment: 0 Foot Assessment Items Site Locations + = Sensation present, - = Sensation absent, C = Callus, U = Ulcer R = Redness, W = Warmth, M = Maceration, PU = Pre-ulcerative lesion F = Fissure, S = Swelling, D = Dryness Assessment Right: Left: Other Deformity: No No Prior Foot Ulcer: No No Prior Amputation: No No Charcot Joint: No No Ambulatory Status: Ambulatory Without Help Gait: Steady Electronic Signature(s) Signed: 02/24/2022 5:22:00 PM By: Blanche East  RN Entered By: Blanche East on 02/24/2022 09:16:20 -------------------------------------------------------------------------------- Nutrition Risk Screening Details Patient Name: Date of Service: [name] RD O. 02/24/2022 9:00 A M Medical Record Number: [id number] Patient Account Number: [id number] Date of Birth/Sex: Treating RN: Oct 19, 1955 (66 y.o. [name] Primary Care Linzie Criss: [name] Other Clinician: Referring Arvon Schreiner: Treating Jocelyn Lowery/Extender: [name] in Treatment: 0 Height (in): 76 Weight (lbs): 322 Body Mass Index (BMI): 39.2 Nutrition Risk Screening Items Score Screening NUTRITION RISK SCREEN: I have an illness or condition that made me change the kind and/or amount of food I eat 0 No I eat fewer than two meals per day 0 No I eat few fruits and vegetables, or milk products 0 No I have three or more drinks of beer, liquor or wine almost every day 0 No I have tooth or mouth problems that make it hard for me to eat 0 No I don't always have enough money to buy the food I need 0 No I eat alone most of the time 0 No I take three or more different prescribed or over-the-counter drugs a day 1 Yes Without wanting to, I have lost or gained 10 pounds in the last six months 2 Yes I am not always physically able to shop, cook and/or feed myself 0 No Nutrition Protocols Good Risk Protocol Moderate Risk Protocol 0 Provide education on nutrition High Risk Proctocol Risk Level: Moderate Risk Score: 3 Electronic Signature(s) Signed: 02/24/2022 5:22:00 PM By: Blanche East RN Entered By: Blanche East on 02/24/2022 09:07:32

## 2022-02-24 NOTE — Progress Notes (Addendum)
RAJAH, TAGLIAFERRO ([id number]) Visit Report for 02/24/2022 Chief Complaint Document Details Patient Name: Date of Service: Clinton Liner RD O. 02/24/2022 9:00 A M Medical Record Number: [id number] Patient Account Number: [id number] Date of Birth/Sex: Treating RN: Jul 08, 1955 (66 y.o. M) Primary Care Provider: [name] Other Clinician: Referring Provider: Treating Provider/Extender: [name] in Treatment: 0 Information Obtained from: Patient Chief Complaint Patient presents for treatment of an open ulcer due to venous insufficiency Electronic Signature(s) Signed: 02/24/2022 10:00:48 AM By: Fredirick Maudlin MD FACS Entered By: Fredirick Maudlin on 02/24/2022 10:00:47 -------------------------------------------------------------------------------- Debridement Details Patient Name: Date of Service: Clinton Liner RD O. 02/24/2022 9:00 A M Medical Record Number: [id number] Patient Account Number: [id number] Date of Birth/Sex: Treating RN: [birth year] (66 y.o. [name] Primary Care Provider: [name] Other Clinician: Referring Provider: Treating Provider/Extender: [name] in Treatment: 0 Debridement Performed for Assessment: Wound #1 Anterior Lower Leg Performed By: Physician Fredirick Maudlin, MD Debridement Type: Debridement Severity of Tissue Pre Debridement: Fat layer exposed Level of Consciousness (Pre-procedure): Awake and Alert Pre-procedure Verification/Time Out Yes - 09:41 Taken: Start Time: 09:42 Pain Control: Lidocaine 5% topical ointment T Area Debrided (L x W): otal 2 (cm) x 3.2 (cm) = 6.4 (cm) Tissue and other material debrided: Non-Viable, Slough, Slough Level: Non-Viable Tissue Debridement Description: Selective/Open Wound Instrument: Curette Bleeding: Minimum Hemostasis Achieved: Pressure Procedural Pain: 4 Post Procedural Pain: 0 Response to Treatment: Procedure was tolerated  well Level of Consciousness (Post- Awake and Alert procedure): Post Debridement Measurements of Total Wound Length: (cm) 2 Width: (cm) 3.2 Depth: (cm) 0.1 Volume: (cm) 0.503 Character of Wound/Ulcer Post Debridement: Improved Severity of Tissue Post Debridement: Fat layer exposed Post Procedure Diagnosis Same as Pre-procedure Electronic Signature(s) Signed: 02/24/2022 10:08:31 AM By: Fredirick Maudlin MD FACS Signed: 02/24/2022 5:22:00 PM By: Blanche East RN Entered By: Blanche East on 02/24/2022 09:43:41 -------------------------------------------------------------------------------- HPI Details Patient Name: Date of Service: Clinton Liner RD O. 02/24/2022 9:00 A M Medical Record Number: [id number] Patient Account Number: [id number] Date of Birth/Sex: Treating RN: 1955-07-12 (66 y.o. M) Primary Care Provider: [name] Other Clinician: Referring Provider: Treating Provider/Extender: [name] in Treatment: 0 History of Present Illness HPI Description: ADMISSION 02/24/2022 This is a 66 year old man with a past medical history significant for congestive heart failure, morbid obesity, dilated cardiomyopathy, atrial fibrillation, and lower extremity cellulitis. He is currently being treated with Keflex. He is not diabetic. He does not smoke. He says that he developed some blisters on his left anterior tibial surface which subsequently broke open causing the wounds for which she is here to see [place] today. He says that he occasionally wears compression stockings but does not do so on a regular basis. He reports that "they look stupid with shorts." ABI in clinic today was 0.94. On the left anterior tibial surface, there are several small wounds in a geographic pattern. They do appear consistent with blisters that have ruptured. The fat layer is exposed and there is a thin layer of slough on the surfaces. He does have some surrounding erythema, but no  purulent drainage. Edema control is very poor. Electronic Signature(s) Signed: 02/24/2022 10:03:10 AM By: Fredirick Maudlin MD FACS Entered By: Fredirick Maudlin on 02/24/2022 10:03:10 -------------------------------------------------------------------------------- Physical Exam Details Patient Name: Date of Service: Clinton Liner RD O. 02/24/2022 9:00 A M Medical Record Number: [id number] Patient Account Number: [id number] Date of Birth/Sex: Treating RN: 04-30-1956 (66 y.o. M)  Primary Care Provider: [name] Other Clinician: Referring Provider: Treating Provider/Extender: [name] in Treatment: 0 Constitutional Hypertensive, asymptomatic. . . . No acute distress. Respiratory Normal work of breathing on room air.. Cardiovascular . 2+ pitting edema to above the knees bilaterally. Notes 02/24/2022: On the left anterior tibial surface, there are several small wounds in a geographic pattern. They do appear consistent with blisters that have ruptured. The fat layer is exposed and there is a thin layer of slough on the surfaces. He does have some surrounding erythema, but no purulent drainage. Edema control is very poor. Electronic Signature(s) Signed: 02/24/2022 10:05:31 AM By: Fredirick Maudlin MD FACS Entered By: Fredirick Maudlin on 02/24/2022 10:05:31 -------------------------------------------------------------------------------- Physician Orders Details Patient Name: Date of Service: Clinton Liner RD O. 02/24/2022 9:00 A M Medical Record Number: [id number] Patient Account Number: [id number] Date of Birth/Sex: Treating RN: 02-01-1956 (66 y.o. [name] Primary Care Provider: [name] Other Clinician: Referring Provider: Treating Provider/Extender: [name] in Treatment: 0 Verbal / Phone Orders: No Diagnosis Coding ICD-10 Coding Code Description [phone] Non-pressure chronic ulcer of other part of left  lower leg with fat layer exposed R60.0 Localized edema S56.81 Chronic systolic (congestive) heart failure I42.0 Dilated cardiomyopathy E66.01 Morbid (severe) obesity due to excess calories I10 Essential (primary) hypertension Follow-up Appointments ppointment in 1 week. - Dr. Celine Ahr- Rm 1 Return A Monday 03/03/22 @ 3:30pm Anesthetic Wound #1 Anterior Lower Leg (In clinic) Topical Lidocaine 5% applied to wound bed Bathing/ Shower/ Hygiene May shower with protection but do not get wound dressing(s) wet. - use cast protector on Left lower leg Edema Control - Lymphedema / SCD / Other Avoid standing for long periods of time. Patient to wear own compression stockings every day. - wear compression stockings Right leg Wound Treatment Wound #1 - Lower Leg Wound Laterality: Anterior Cleanser: Soap and Water Discharge Instructions: May shower and wash wound with dial antibacterial soap and water prior to dressing change. Peri-Wound Care: Sween Lotion (Moisturizing lotion) Discharge Instructions: Apply moisturizing lotion as directed Topical: Mupirocin Ointment Discharge Instructions: Apply Mupirocin (Bactroban) as instructed Prim Dressing: KerraCel Ag Gelling Fiber Dressing, 4x5 in (silver alginate) ary Discharge Instructions: Apply silver alginate to wound bed as instructed Secondary Dressing: Zetuvit Plus Silicone Border Dressing 4x4 (in/in) Discharge Instructions: Apply silicone border over primary dressing as directed. Secured With: 14M Medipore Public affairs consultant Surgical T 2x10 (in/yd) ape Discharge Instructions: Secure with tape as directed. Compression Wrap: ThreePress (3 layer compression wrap) Discharge Instructions: Apply three layer compression as directed. Services and Therapies Venous Studies -Bilateral with reflux - bilateral lower extremity edema Notes venous reflux study Electronic Signature(s) Signed: 02/24/2022 10:40:22 AM By: Fredirick Maudlin MD FACS Signed: 02/24/2022 5:22:00  PM By: Blanche East RN Previous Signature: 02/24/2022 10:08:31 AM Version By: Fredirick Maudlin MD FACS Entered By: Blanche East on 02/24/2022 10:16:54 Prescription 02/24/2022 -------------------------------------------------------------------------------- [name] Fredirick Maudlin MD Patient Name: Provider: [birth year] [id number] Date of Birth: NPI#: Jerilynn Mages [id number] Sex: DEA #: [phone] Phone #: License #: Milo Patient Address: [address] [place], [place] [address] [place], [place] [address] [phone] Allergies hydrocodone Provider's Orders Venous Studies -Bilateral with reflux - bilateral lower extremity edema Hand Signature: Date(s): Electronic Signature(s) Signed: 02/24/2022 10:40:22 AM By: Fredirick Maudlin MD FACS Signed: 02/24/2022 5:22:00 PM By: Blanche East RN Previous Signature: 02/24/2022 10:08:21 AM Version By: Fredirick Maudlin MD FACS  Entered By: Blanche East on 02/24/2022 10:16:54 -------------------------------------------------------------------------------- Problem List Details Patient Name: Date of Service: Clinton Liner RD O. 02/24/2022 9:00 A M Medical Record Number: [id number] Patient Account Number: [id number] Date of Birth/Sex: Treating RN: 02-09-1956 (66 y.o. M) Primary Care Provider: [name] Other Clinician: Referring Provider: Treating Provider/Extender: [name] in Treatment: 0 Active Problems ICD-10 Encounter Code Description Active Date MDM Diagnosis [phone] Non-pressure chronic ulcer of other part of left lower leg with fat layer exposed 02/24/2022 No Yes R60.0 Localized edema 02/24/2022 No Yes J57.01 Chronic systolic (congestive) heart failure 02/24/2022 No Yes I42.0 Dilated cardiomyopathy 02/24/2022 No Yes E66.01 Morbid (severe) obesity due to excess calories 02/24/2022 No Yes I10 Essential  (primary) hypertension 02/24/2022 No Yes Inactive Problems Resolved Problems Electronic Signature(s) Signed: 02/24/2022 10:00:20 AM By: Fredirick Maudlin MD FACS Previous Signature: 02/24/2022 9:02:02 AM Version By: Fredirick Maudlin MD FACS Entered By: Fredirick Maudlin on 02/24/2022 10:00:19 -------------------------------------------------------------------------------- Progress Note Details Patient Name: Date of Service: Clinton Liner RD O. 02/24/2022 9:00 A M Medical Record Number: [id number] Patient Account Number: [id number] Date of Birth/Sex: Treating RN: 12/02/1955 (66 y.o. M) Primary Care Provider: [name] Other Clinician: Referring Provider: Treating Provider/Extender: [name] in Treatment: 0 Subjective Chief Complaint Information obtained from Patient Patient presents for treatment of an open ulcer due to venous insufficiency History of Present Illness (HPI) ADMISSION 02/24/2022 This is a 66 year old man with a past medical history significant for congestive heart failure, morbid obesity, dilated cardiomyopathy, atrial fibrillation, and lower extremity cellulitis. He is currently being treated with Keflex. He is not diabetic. He does not smoke. He says that he developed some blisters on his left anterior tibial surface which subsequently broke open causing the wounds for which she is here to see [place] today. He says that he occasionally wears compression stockings but does not do so on a regular basis. He reports that "they look stupid with shorts." ABI in clinic today was 0.94. On the left anterior tibial surface, there are several small wounds in a geographic pattern. They do appear consistent with blisters that have ruptured. The fat layer is exposed and there is a thin layer of slough on the surfaces. He does have some surrounding erythema, but no purulent drainage. Edema control is very poor. Patient History Information obtained from  Patient. Allergies hydrocodone (Severity: Moderate, Reaction: rash) Family History Cancer - Father,Mother, Hypertension - Mother,Father, No family history of Diabetes, Heart Disease, Hereditary Spherocytosis, Kidney Disease, Lung Disease, Seizures, Thyroid Problems, Tuberculosis. 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 Medical History Eyes Patient has history of Cataracts - 2021 Ear/Nose/Mouth/Throat Denies history of Chronic sinus problems/congestion, Middle ear problems Hematologic/Lymphatic Patient has history of Lymphedema Respiratory Patient has history of Sleep Apnea Cardiovascular Patient has history of Congestive Heart Failure, Coronary Artery Disease, Hypertension Denies history of Deep Vein Thrombosis Gastrointestinal Denies history of Cirrhosis , Colitis, Crohnoos Endocrine Denies history of Type I Diabetes, Type II Diabetes Genitourinary Denies history of End Stage Renal Disease Immunological Denies history of Raynaudoos, Scleroderma Integumentary (Skin) Denies history of History of Burn Musculoskeletal Denies history of Gout, Rheumatoid Arthritis, Osteoarthritis, Osteomyelitis Neurologic Denies history of Dementia, Neuropathy, Quadriplegia, Paraplegia, Seizure Disorder Oncologic Denies history of Received Chemotherapy, Received Radiation Psychiatric Patient has history of Confinement Anxiety Denies history of Anorexia/bulimia Hospitalization/Surgery History - Inguinal Hernia repair- 2014. - insertion of  mesh-2014. - Atrial ablation surgery- 2007. - BIla eyes lasik- 2008. - hernia repair- 1977. Medical A Surgical History Notes nd Respiratory Allergic Rhinitis Cardiovascular AFIB Review of Systems (ROS) Constitutional Symptoms (General Health) Complains or has symptoms of Marked Weight Change - gained 30 over past year. Denies complaints or symptoms of Fatigue,  Fever, Chills. Eyes Denies complaints or symptoms of Vision Changes. Ear/Nose/Mouth/Throat Denies complaints or symptoms of Chronic sinus problems or rhinitis. Respiratory Denies complaints or symptoms of Chronic or frequent coughs, Shortness of Breath. Cardiovascular Denies complaints or symptoms of Chest pain. Gastrointestinal Complains or has symptoms of Frequent diarrhea. Denies complaints or symptoms of Nausea, Vomiting. Endocrine Denies complaints or symptoms of Heat/cold intolerance. Genitourinary Denies complaints or symptoms of Frequent urination. Integumentary (Skin) Complains or has symptoms of Wounds - LLE. Musculoskeletal Denies complaints or symptoms of Muscle Pain, Muscle Weakness. Neurologic Denies complaints or symptoms of Numbness/parasthesias. Psychiatric Denies complaints or symptoms of Claustrophobia, Suicidal. Objective Constitutional Hypertensive, asymptomatic. No acute distress. Vitals Time Taken: 8:49 AM, Height: 76 in, Source: Stated, Weight: 322 lbs, Source: Stated, BMI: 39.2, Temperature: 97.8 F, Pulse: 91 bpm, Respiratory Rate: 20 breaths/min, Blood Pressure: 158/108 mmHg. Respiratory Normal work of breathing on room air.. Cardiovascular 2+ pitting edema to above the knees bilaterally. General Notes: 02/24/2022: On the left anterior tibial surface, there are several small wounds in a geographic pattern. They do appear consistent with blisters that have ruptured. The fat layer is exposed and there is a thin layer of slough on the surfaces. He does have some surrounding erythema, but no purulent drainage. Edema control is very poor. Integumentary (Hair, Skin) Wound #1 status is Open. Original cause of wound was Blister. The date acquired was: 09/25/2021. The wound is located on the Anterior Lower Leg. The wound measures 2cm length x 3.2cm width x 0.1cm depth; 5.027cm^2 area and 0.503cm^3 volume. There is Fat Layer (Subcutaneous Tissue) exposed. There  is no tunneling or undermining noted. There is a large amount of serous drainage noted. The wound margin is flat and intact. There is medium (34-66%) red, pink granulation within the wound bed. There is a medium (34-66%) amount of necrotic tissue within the wound bed including Adherent Slough. Assessment Active Problems ICD-10 Non-pressure chronic ulcer of other part of left lower leg with fat layer exposed Localized edema Chronic systolic (congestive) heart failure Dilated cardiomyopathy Morbid (severe) obesity due to excess calories Essential (primary) hypertension Procedures Wound #1 Pre-procedure diagnosis of Wound #1 is a Venous Leg Ulcer located on the Anterior Lower Leg .Severity of Tissue Pre Debridement is: Fat layer exposed. There was a Selective/Open Wound Non-Viable Tissue Debridement with a total area of 6.4 sq cm performed by Fredirick Maudlin, MD. With the following instrument(s): Curette to remove Non-Viable tissue/material. Material removed includes [facility] after achieving pain control using Lidocaine 5% topical ointment. No specimens were taken. A time out was conducted at 09:41, prior to the start of the procedure. A Minimum amount of bleeding was controlled with Pressure. The procedure was tolerated well with a pain level of 4 throughout and a pain level of 0 following the procedure. Post Debridement Measurements: 2cm length x 3.2cm width x 0.1cm depth; 0.503cm^3 volume. Character of Wound/Ulcer Post Debridement is improved. Severity of Tissue Post Debridement is: Fat layer exposed. Post procedure Diagnosis Wound #1: Same as Pre-Procedure Pre-procedure diagnosis of Wound #1 is a Venous Leg Ulcer located on the Anterior Lower Leg . There was a Three Layer Compression Therapy Procedure by Blanche East, RN.  Post procedure Diagnosis Wound #1: Same as Pre-Procedure Plan Follow-up Appointments: Return Appointment in 1 week. - Dr. Celine Ahr- Rm 1 Monday 03/03/22 @  3:30pm Anesthetic: Wound #1 Anterior Lower Leg: (In clinic) Topical Lidocaine 5% applied to wound bed Edema Control - Lymphedema / SCD / Other: Avoid standing for long periods of time. Patient to wear own compression stockings every day. - wear compression stockings Right leg Services and Therapies ordered were: Venous Studies -Bilateral with reflux - bilateral lower extremity edema General Notes: venous reflux study WOUND #1: - Lower Leg Wound Laterality: Anterior Cleanser: Soap and Water Discharge Instructions: May shower and wash wound with dial antibacterial soap and water prior to dressing change. Peri-Wound Care: Sween Lotion (Moisturizing lotion) Discharge Instructions: Apply moisturizing lotion as directed Topical: Mupirocin Ointment Discharge Instructions: Apply Mupirocin (Bactroban) as instructed Prim Dressing: KerraCel Ag Gelling Fiber Dressing, 4x5 in (silver alginate) ary Discharge Instructions: Apply silver alginate to wound bed as instructed Secondary Dressing: Zetuvit Plus Silicone Border Dressing 4x4 (in/in) Discharge Instructions: Apply silicone border over primary dressing as directed. Secured With: 80M Medipore Public affairs consultant Surgical T 2x10 (in/yd) ape Discharge Instructions: Secure with tape as directed. Com pression Wrap: ThreePress (3 layer compression wrap) Discharge Instructions: Apply three layer compression as directed. 02/24/2022: 66 year old man with congestive heart failure and new wounds on his leg. On the left anterior tibial surface, there are several small wounds in a geographic pattern. They do appear consistent with blisters that have ruptured. The fat layer is exposed and there is a thin layer of slough on the surfaces. He does have some surrounding erythema, but no purulent drainage. Edema control is very poor. I used a curette to debride the slough from his wounds. Given the erythema and the fact that he is currently taking Keflex for cellulitis, we  will apply topical mupirocin followed by silver alginate. He is pouring fluid from his wounds so we will use the tube for it to try and absorb this better, followed by 3 layer compression. We will also order venous reflux studies to evaluate for venous insufficiency, but I think this is potentially related to either lymphedema or chronic fluid overload from his heart failure. He will follow-up in 1 week. Electronic Signature(s) Signed: 02/24/2022 10:07:13 AM By: Fredirick Maudlin MD FACS Entered By: Fredirick Maudlin on 02/24/2022 10:07:13 -------------------------------------------------------------------------------- HxROS Details Patient Name: Date of Service: Clinton Liner RD O. 02/24/2022 9:00 A M Medical Record Number: [id number] Patient Account Number: [id number] Date of Birth/Sex: Treating RN: [birth year] (66 y.o. [name] Primary Care Provider: [name] Other Clinician: Referring Provider: Treating Provider/Extender: [name] in Treatment: 0 Information Obtained From Patient Constitutional Symptoms (General Health) Complaints and Symptoms: Positive for: Marked Weight Change - gained 30 over past year Negative for: Fatigue; Fever; Chills Eyes Complaints and Symptoms: Negative for: Vision Changes Medical History: Positive for: Cataracts - 2021 Ear/Nose/Mouth/Throat Complaints and Symptoms: Negative for: Chronic sinus problems or rhinitis Medical History: Negative for: Chronic sinus problems/congestion; Middle ear problems Respiratory Complaints and Symptoms: Negative for: Chronic or frequent coughs; Shortness of Breath Medical History: Positive for: Sleep Apnea Past Medical History Notes: Allergic Rhinitis Cardiovascular Complaints and Symptoms: Negative for: Chest pain Medical History: Positive for: Congestive Heart Failure; Coronary Artery Disease; Hypertension Negative for: Deep Vein Thrombosis Past Medical History  Notes: AFIB Gastrointestinal Complaints and Symptoms: Positive for: Frequent diarrhea Negative for: Nausea; Vomiting Medical History: Negative for: Cirrhosis ; Colitis; Crohns Endocrine Complaints and Symptoms: Negative for:  Heat/cold intolerance Medical History: Negative for: Type I Diabetes; Type II Diabetes Genitourinary Complaints and Symptoms: Negative for: Frequent urination Medical History: Negative for: End Stage Renal Disease Integumentary (Skin) Complaints and Symptoms: Positive for: Wounds - LLE Medical History: Negative for: History of Burn Musculoskeletal Complaints and Symptoms: Negative for: Muscle Pain; Muscle Weakness Medical History: Negative for: Gout; Rheumatoid Arthritis; Osteoarthritis; Osteomyelitis Neurologic Complaints and Symptoms: Negative for: Numbness/parasthesias Medical History: Negative for: Dementia; Neuropathy; Quadriplegia; Paraplegia; Seizure Disorder Psychiatric Complaints and Symptoms: Negative for: Claustrophobia; Suicidal Medical History: Positive for: Confinement Anxiety Negative for: Anorexia/bulimia Hematologic/Lymphatic Medical History: Positive for: Lymphedema Immunological Medical History: Negative for: Raynauds; Scleroderma Oncologic Medical History: Negative for: Received Chemotherapy; Received Radiation HBO Extended History Items Eyes: Cataracts Immunizations Pneumococcal Vaccine: Received Pneumococcal Vaccination: Yes Received Pneumococcal Vaccination On or After 60th Birthday: Yes Implantable Devices None Hospitalization / Surgery History Type of Hospitalization/Surgery Inguinal Hernia repair- 2014 insertion of mesh-2014 Atrial ablation surgery- 2007 BIla eyes lasik- 2008 hernia repair- 1977 Family and Social History Cancer: Yes - Father,Mother; Diabetes: No; Heart Disease: No; Hereditary Spherocytosis: No; Hypertension: Yes - Mother,Father; Kidney Disease: No; Lung Disease: No; Seizures: No; Thyroid  Problems: No; Tuberculosis: No; Never smoker; Marital Status - Married; Alcohol Use: Never; Drug Use: No History; Caffeine Use: Daily - [employer] dew; Financial Concerns: No; Food, Clothing or Shelter Needs: No; Transportation Concerns: No Physician Affirmation I have reviewed and agree with the above information. Electronic Signature(s) Signed: 02/24/2022 10:08:31 AM By: Fredirick Maudlin MD FACS Signed: 02/24/2022 5:22:00 PM By: Blanche East RN Entered By: Blanche East on 02/24/2022 09:04:00 -------------------------------------------------------------------------------- SuperBill Details Patient Name: Date of Service: Clinton Liner RD O. 02/24/2022 Medical Record Number: [id number] Patient Account Number: [id number] Date of Birth/Sex: Treating RN: 02-Apr-1956 (66 y.o. M) Primary Care Provider: [name] Other Clinician: Referring Provider: Treating Provider/Extender: [name] in Treatment: 0 Diagnosis Coding ICD-10 Codes Code Description [phone] Non-pressure chronic ulcer of other part of left lower leg with fat layer exposed R60.0 Localized edema L97.67 Chronic systolic (congestive) heart failure I42.0 Dilated cardiomyopathy E66.01 Morbid (severe) obesity due to excess calories I10 Essential (primary) hypertension Facility Procedures CPT4 Code: [id number] Description: 99213 - WOUND CARE VISIT-LEV 3 EST PT Modifier: 25 Quantity: 1 CPT4 Code: [id number] Description: 29924 - DEBRIDE WOUND 1ST 20 SQ CM OR < ICD-10 Diagnosis Description 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 Modifier: d Quantity: 1 Physician Procedures : CPT4 Code Description Modifier [id number] 62229 - WC PHYS LEVEL 4 - NEW PT 25 ICD-10 Diagnosis Description 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d R60.0 Localized edema N98.92 Chronic systolic (congestive)  heart failure E66.01 Morbid  (severe) obesity due to excess calories [id number] - WC PHYS DEBR WO ANESTH 20 SQ CM 1 ICD-10 Diagnosis Description 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d Quantity: 1 Electronic Signature(s) Signed: 02/25/2022 4:32:12 PM By: Blanche East RN Signed: 02/26/2022 7:39:57 AM By: Fredirick Maudlin MD FACS Previous Signature: 02/24/2022 10:07:33 AM Version By: Fredirick Maudlin MD FACS Entered By: Blanche East on 02/25/2022 16:21:06

## 2022-02-24 NOTE — Progress Notes (Signed)
HOSEY, BURMESTER ([id number]) Visit Report for 02/24/2022 Allergy List Details Patient Name: Date of Service: [name] 02/24/2022 9:00 A M Medical Record Number: [id number] Patient Account Number: [id number] Date of Birth/Sex: Treating RN: 02/27/1956 (66 y.[name] [name] Primary Care Prestin Munch: [name] Other Clinician: Referring Aune Adami: Treating Letina Luckett/Extender: [name] in Treatment: 0 Allergies Active Allergies hydrocodone Reaction: rash Severity: Moderate Allergy Notes Electronic Signature(s) Signed: 02/24/2022 5:22:00 PM By: Blanche East RN Entered By: Blanche East on 02/24/2022 08:52:01 -------------------------------------------------------------------------------- Arrival Information Details Patient Name: Date of Service: [name] 02/24/2022 9:00 A M Medical Record Number: [id number] Patient Account Number: [id number] Date of Birth/Sex: Treating RN: 1955-12-22 (56 y.[name] [name] Primary Care Nakeda Lebron: [name] Other Clinician: Referring Prosperity Darrough: Treating Teri Diltz/Extender: [name] in Treatment: 0 Visit Information Patient Arrived: Ambulatory Arrival Time: 08:46 Accompanied By: self Transfer Assistance: None Patient Identification Verified: Yes Patient Requires Transmission-Based Precautions: No Patient Has Alerts: No Electronic Signature(s) Signed: 02/24/2022 5:22:00 PM By: Blanche East RN Entered By: Blanche East on 02/24/2022 08:47:11 -------------------------------------------------------------------------------- Clinic Level of Care Assessment Details Patient Name: Date of Service: [facility] RD [name] 02/24/2022 9:00 A M Medical Record Number: [id number] Patient Account Number: [id number] Date of Birth/Sex: Treating RN: 1956/04/22 (37 y.[name] [name] Primary Care Shardea Cwynar: [name] Other Clinician: Referring Dajanique Robley: Treating  Milany Geck/Extender: [name] in Treatment: 0 Clinic Level of Care Assessment Items TOOL 1 Quantity Score X- 1 0 Use when EandM and Procedure is performed on INITIAL visit ASSESSMENTS - Nursing Assessment / Reassessment X- 1 20 General Physical Exam (combine w/ comprehensive assessment (listed just below) when performed on new pt. evals) X- 1 25 Comprehensive Assessment (HX, ROS, Risk Assessments, Wounds Hx, etc.) ASSESSMENTS - Wound and Skin Assessment / Reassessment '[]'$  - 0 Dermatologic / Skin Assessment (not related to wound area) ASSESSMENTS - Ostomy and/or Continence Assessment and Care '[]'$  - 0 Incontinence Assessment and Management '[]'$  - 0 Ostomy Care Assessment and Management (repouching, etc.) PROCESS - Coordination of Care X - Simple Patient / Family Education for ongoing care 1 15 '[]'$  - 0 Complex (extensive) Patient / Family Education for ongoing care X- 1 10 Staff obtains Programmer, systems, Records, T Results / Process Orders est X- 1 10 Staff telephones HHA, Nursing Homes / Clarify orders / etc '[]'$  - 0 Routine Transfer to another Facility (non-emergent condition) '[]'$  - 0 Routine Hospital Admission (non-emergent condition) '[]'$  - 0 New Admissions / Biomedical engineer / Ordering NPWT Apligraf, etc. , '[]'$  - 0 Emergency Hospital Admission (emergent condition) PROCESS - Special Needs '[]'$  - 0 Pediatric / Minor Patient Management '[]'$  - 0 Isolation Patient Management '[]'$  - 0 Hearing / Language / Visual special needs '[]'$  - 0 Assessment of Community assistance (transportation, D/C planning, etc.) '[]'$  - 0 Additional assistance / Altered mentation '[]'$  - 0 Support Surface(s) Assessment (bed, cushion, seat, etc.) INTERVENTIONS - Miscellaneous '[]'$  - 0 External ear exam '[]'$  - 0 Patient Transfer (multiple staff / Civil Service fast streamer / Similar devices) '[]'$  - 0 Simple Staple / Suture removal (25 or less) '[]'$  - 0 Complex Staple / Suture removal (26 or more) '[]'$  -  0 Hypo/Hyperglycemic Management (do not check if billed separately) X- 1 15 Ankle / Brachial Index (ABI) - do not check if billed separately Has the patient been seen at the hospital within the last three years: Yes Total Score: 95 Level Of  Care: New/Established - Level 3 Electronic Signature(s) Signed: 02/24/2022 5:22:00 PM By: Blanche East RN Entered By: Blanche East on 02/24/2022 09:55:30 -------------------------------------------------------------------------------- Compression Therapy Details Patient Name: Date of Service: [name] 02/24/2022 9:00 A M Medical Record Number: [id number] Patient Account Number: [id number] Date of Birth/Sex: Treating RN: 1955-10-07 (2 y.[name] [name] Primary Care Eniyah Eastmond: [name] Other Clinician: Referring Vonceil Upshur: Treating Celia Gibbons/Extender: [name] in Treatment: 0 Compression Therapy Performed for Wound Assessment: Wound #1 Anterior Lower Leg Performed By: Clinician Blanche East, RN Compression Type: Three Layer Post Procedure Diagnosis Same as Pre-procedure Electronic Signature(s) Signed: 02/24/2022 5:22:00 PM By: Blanche East RN Entered By: Blanche East on 02/24/2022 09:45:49 -------------------------------------------------------------------------------- Encounter Discharge Information Details Patient Name: Date of Service: [name] 02/24/2022 9:00 A M Medical Record Number: [id number] Patient Account Number: [id number] Date of Birth/Sex: Treating RN: 11/14/1955 (36 y.[name] [name] Primary Care Shavanna Furnari: [name] Other Clinician: Referring Tereka Thorley: Treating Haven Foss/Extender: [name] in Treatment: 0 Encounter Discharge Information Items Post Procedure Vitals Discharge Condition: Stable Temperature (F): 97.8 Ambulatory Status: Ambulatory Pulse (bpm): 91 Discharge Destination: Home Respiratory Rate (breaths/min):  20 Transportation: Private Auto Blood Pressure (mmHg): 158/108 Accompanied By: self Schedule Follow-up Appointment: Yes Clinical Summary of Care: Electronic Signature(s) Signed: 02/24/2022 5:22:00 PM By: Blanche East RN Entered By: Blanche East on 02/24/2022 10:18:05 -------------------------------------------------------------------------------- Lower Extremity Assessment Details Patient Name: Date of Service: [name] 02/24/2022 9:00 A M Medical Record Number: [id number] Patient Account Number: [id number] Date of Birth/Sex: Treating RN: 28-Jul-1955 (24 y.[name] [name] Primary Care Indria Bishara: [name] Other Clinician: Referring Latunya Kissick: Treating Krista Godsil/Extender: [name] in Treatment: 0 Edema Assessment Assessed: [name]: No] [Right: No] E[Left: dema] [Right: :] Calf Left: Right: Point of Measurement: From Medial Instep 51 cm Ankle Left: Right: Point of Measurement: From Medial Instep 29 cm Knee To Floor Left: Right: From Medial Instep 51 cm Vascular Assessment Pulses: Dorsalis Pedis Palpable: [Left:Yes] Blood Pressure: Brachial: [Left:158] Dorsalis Pedis: 148 Ankle: Posterior Tibial: 138 Ankle Brachial Index: [Left:0.94] Electronic Signature(s) Signed: 02/24/2022 5:22:00 PM By: Blanche East RN Signed: 02/24/2022 5:48:43 PM By: Baruch Gouty RN, BSN Entered By: Baruch Gouty on 02/24/2022 09:59:23 -------------------------------------------------------------------------------- Multi Wound Chart Details Patient Name: Date of Service: [name] 02/24/2022 9:00 A M Medical Record Number: [id number] Patient Account Number: [id number] Date of Birth/Sex: Treating RN: 10/06/1955 (46 y.[name] M) Primary Care Azaan Leask: [name] Other Clinician: Referring Sherrika Weakland: Treating Pranay Hilbun/Extender: [name] in Treatment: 0 Vital Signs Height(in): 76 Pulse(bpm):  28 Weight(lbs): 322 Blood Pressure(mmHg): 158/108 Body Mass Index(BMI): 39.2 Temperature(F): 97.8 Respiratory Rate(breaths/min): 20 Photos: [N/A:N/A] Anterior Lower Leg N/A N/A Wound Location: Blister N/A N/A Wounding Event: Venous Leg Ulcer N/A N/A Primary Etiology: Cataracts, Lymphedema, Sleep N/A N/A Comorbid History: Apnea, Congestive Heart Failure, Coronary Artery Disease, Hypertension, Confinement Anxiety 09/25/2021 N/A N/A Date Acquired: 0 N/A N/A Weeks of Treatment: Open N/A N/A Wound Status: No N/A N/A Wound Recurrence: Yes N/A N/A Clustered Wound: 2x3.2x0.1 N/A N/A Measurements L x W x D (cm) 5.027 N/A N/A A (cm) : rea 0.503 N/A N/A Volume (cm) : 0.00% N/A N/A % Reduction in Area: 0.00% N/A N/A % Reduction in Volume: Full Thickness Without Exposed N/A N/A Classification: Support Structures Large N/A N/A Exudate A mount: Serous N/A N/A Exudate Type: amber N/A N/A Exudate Color: Flat and Intact N/A N/A  Wound Margin: Medium (34-66%) N/A N/A Granulation A mount: Red, Pink N/A N/A Granulation Quality: Medium (34-66%) N/A N/A Necrotic A mount: Fat Layer (Subcutaneous Tissue): Yes N/A N/A Exposed Structures: Fascia: No Tendon: No Muscle: No Joint: No Bone: No Medium (34-66%) N/A N/A Epithelialization: Debridement - Selective/Open Wound N/A N/A Debridement: Pre-procedure Verification/Time Out 09:41 N/A N/A Taken: Lidocaine 5% topical ointment N/A N/A Pain Control: Slough N/A N/A Tissue Debrided: Non-Viable Tissue N/A N/A Level: 6.4 N/A N/A Debridement A (sq cm): rea Curette N/A N/A Instrument: Minimum N/A N/A Bleeding: Pressure N/A N/A Hemostasis A chieved: 4 N/A N/A Procedural Pain: 0 N/A N/A Post Procedural Pain: Procedure was tolerated well N/A N/A Debridement Treatment Response: 2x3.2x0.1 N/A N/A Post Debridement Measurements L x W x D (cm) 0.503 N/A N/A Post Debridement Volume: (cm) Compression Therapy N/A  N/A Procedures Performed: Debridement Treatment Notes Electronic Signature(s) Signed: 02/24/2022 10:00:30 AM By: Fredirick Maudlin MD FACS Entered By: Fredirick Maudlin on 02/24/2022 10:00:30 -------------------------------------------------------------------------------- Multi-Disciplinary Care Plan Details Patient Name: Date of Service: [name] 02/24/2022 9:00 A M Medical Record Number: [id number] Patient Account Number: [id number] Date of Birth/Sex: Treating RN: 12-22-1955 (40 y.[name] [name] Primary Care Baily Hovanec: [name] Other Clinician: Referring Talia Hoheisel: Treating Dellar Traber/Extender: [name] in Treatment: 0 Active Inactive Orientation to the Wound Care Program Nursing Diagnoses: Knowledge deficit related to the wound healing center program Goals: Patient/caregiver will verbalize understanding of the [facility] Program Date Initiated: 02/24/2022 Target Resolution Date: 03/15/2022 Goal Status: Active Interventions: Provide education on orientation to the wound center Notes: Venous Leg Ulcer Nursing Diagnoses: Potential for venous Insuffiency (use before diagnosis confirmed) Goals: Patient will maintain optimal edema control Date Initiated: 02/24/2022 Target Resolution Date: 03/31/2022 Goal Status: Active Interventions: Assess peripheral edema status every visit. Compression as ordered Notes: Wound/Skin Impairment Nursing Diagnoses: Impaired tissue integrity Goals: Ulcer/skin breakdown will have a volume reduction of 30% by week 4 Date Initiated: 02/24/2022 Target Resolution Date: 03/31/2022 Goal Status: Active Interventions: Assess patient/caregiver ability to perform ulcer/skin care regimen upon admission and as needed Treatment Activities: Topical wound management initiated : 02/24/2022 Notes: Electronic Signature(s) Signed: 02/24/2022 5:22:00 PM By: Blanche East RN Entered By: Blanche East on  02/24/2022 09:53:32 -------------------------------------------------------------------------------- Pain Assessment Details Patient Name: Date of Service: [name] 02/24/2022 9:00 A M Medical Record Number: [id number] Patient Account Number: [id number] Date of Birth/Sex: Treating RN: 03/12/1956 (63 y.[name] [name] Primary Care Bridgitte Felicetti: [name] Other Clinician: Referring Alegandro Macnaughton: Treating Maliya Marich/Extender: [name] in Treatment: 0 Active Problems Location of Pain Severity and Description of Pain Patient Has Paino Yes Site Locations Pain Location: Pain Location: Generalized Pain Rate the pain. Current Pain Level: 3 Worst Pain Level: 10 Character of Pain Describe the Pain: Aching, Sharp, Throbbing Pain Management and Medication Current Pain Management: Electronic Signature(s) Signed: 02/24/2022 5:22:00 PM By: Blanche East RN Entered By: Blanche East on 02/24/2022 09:30:49 -------------------------------------------------------------------------------- Patient/Caregiver Education Details Patient Name: Date of Service: [name] 8/21/2023andnbsp9:00 A M Medical Record Number: [id number] Patient Account Number: [id number] Date of Birth/Gender: Treating RN: Dec 02, 1955 (78 y.[name] [name] Primary Care Physician: [name] Other Clinician: Referring Physician: Treating Physician/Extender: [name] in Treatment: 0 Education Assessment Education Provided To: Patient Education Topics Provided Welcome T [facility]: [name] Handouts: Welcome T [name] Methods: Explain/Verbal Responses: Reinforcements needed, State content correctly Wound Debridement:  Handouts: Wound Debridement Methods: Explain/Verbal Responses: Reinforcements needed, State content correctly Wound/Skin Impairment: Handouts: Caring for Your Ulcer Methods:  Explain/Verbal Responses: Reinforcements needed, State content correctly Electronic Signature(s) Signed: 02/24/2022 5:22:00 PM By: Blanche East RN Entered By: Blanche East on 02/24/2022 09:54:25 -------------------------------------------------------------------------------- Wound Assessment Details Patient Name: Date of Service: [name] 02/24/2022 9:00 A M Medical Record Number: [id number] Patient Account Number: [id number] Date of Birth/Sex: Treating RN: 05/28/1956 (24 y.[name] [name] Primary Care Kalisa Girtman: [name] Other Clinician: Referring Alexius Ellington: Treating Joyanna Kleman/Extender: [name] in Treatment: 0 Wound Status Wound Number: 1 Primary Venous Leg Ulcer Etiology: Wound Location: Anterior Lower Leg Wound Open Wounding Event: Blister Status: Date Acquired: 09/25/2021 Comorbid Cataracts, Lymphedema, Sleep Apnea, Congestive Heart Failure, Weeks Of Treatment: 0 History: Coronary Artery Disease, Hypertension, Confinement Anxiety Clustered Wound: Yes Photos Wound Measurements Length: (cm) 2 Width: (cm) 3.2 Depth: (cm) 0.1 Area: (cm) 5.027 Volume: (cm) 0.503 % Reduction in Area: 0% % Reduction in Volume: 0% Epithelialization: Medium (34-66%) Tunneling: No Undermining: No Wound Description Classification: Full Thickness Without Exposed Support Structures Wound Margin: Flat and Intact Exudate Amount: Large Exudate Type: Serous Exudate Color: amber Foul Odor After Cleansing: No Slough/Fibrino Yes Wound Bed Granulation Amount: Medium (34-66%) Exposed Structure Granulation Quality: Red, Pink Fascia Exposed: No Necrotic Amount: Medium (34-66%) Fat Layer (Subcutaneous Tissue) Exposed: Yes Necrotic Quality: Adherent Slough Tendon Exposed: No Muscle Exposed: No Joint Exposed: No Bone Exposed: No Treatment Notes Wound #1 (Lower Leg) Wound Laterality: Anterior Cleanser Soap and Water Discharge Instruction: May  shower and wash wound with dial antibacterial soap and water prior to dressing change. Peri-Wound Care Sween Lotion (Moisturizing lotion) Discharge Instruction: Apply moisturizing lotion as directed Topical Mupirocin Ointment Discharge Instruction: Apply Mupirocin (Bactroban) as instructed Primary Dressing KerraCel Ag Gelling Fiber Dressing, 4x5 in (silver alginate) Discharge Instruction: Apply silver alginate to wound bed as instructed Secondary Dressing Zetuvit Plus Silicone Border Dressing 4x4 (in/in) Discharge Instruction: Apply silicone border over primary dressing as directed. Secured With [employer] Surgical T 2x10 (in/yd) ape Discharge Instruction: Secure with tape as directed. Compression Wrap ThreePress (3 layer compression wrap) Discharge Instruction: Apply three layer compression as directed. Compression Stockings Add-Ons Electronic Signature(s) Signed: 02/24/2022 5:22:00 PM By: Blanche East RN Entered By: Blanche East on 02/24/2022 09:29:49 -------------------------------------------------------------------------------- Vitals Details Patient Name: Date of Service: [name] 02/24/2022 9:00 A M Medical Record Number: [id number] Patient Account Number: [id number] Date of Birth/Sex: Treating RN: 11-07-1955 (62 y.[name] [name] Primary Care Annasophia Crocker: [name] Other Clinician: Referring Lacy Sofia: Treating Tahjanae Blankenburg/Extender: [name] in Treatment: 0 Vital Signs Time Taken: 08:49 Temperature (F): 97.8 Height (in): 76 Pulse (bpm): 91 Source: Stated Respiratory Rate (breaths/min): 20 Weight (lbs): 322 Blood Pressure (mmHg): 158/108 Source: Stated Reference Range: 80 - 120 mg / dl Body Mass Index (BMI): 39.2 Electronic Signature(s) Signed: 02/24/2022 5:22:00 PM By: Blanche East RN Entered By: Blanche East on 02/24/2022 08:50:16

## 2022-02-28 ENCOUNTER — Encounter (HOSPITAL_BASED_OUTPATIENT_CLINIC_OR_DEPARTMENT_OTHER): Payer: 59 | Admitting: General Surgery

## 2022-02-28 DIAGNOSIS — L97822 Non-pressure chronic ulcer of other part of left lower leg with fat layer exposed: Secondary | ICD-10-CM | POA: Diagnosis not present

## 2022-03-03 ENCOUNTER — Encounter (HOSPITAL_BASED_OUTPATIENT_CLINIC_OR_DEPARTMENT_OTHER): Payer: 59 | Admitting: General Surgery

## 2022-03-03 DIAGNOSIS — L97822 Non-pressure chronic ulcer of other part of left lower leg with fat layer exposed: Secondary | ICD-10-CM | POA: Diagnosis not present

## 2022-03-03 NOTE — Progress Notes (Signed)
DEREN, DEGRAZIA ([id number]) Visit Report for 02/28/2022 SuperBill Details Patient Name: Date of Service: [facility] RD O. 02/28/2022 Medical Record Number: [id number] Patient Account Number: [id number] Date of Birth/Sex: Treating RN: 03-02-1956 (66 y.o. [name] Primary Care Provider: [name] Other Clinician: Referring Provider: Treating Provider/Extender: [name] in Treatment: 0 Diagnosis Coding ICD-10 Codes Code Description [phone] Non-pressure chronic ulcer of other part of left lower leg with fat layer exposed R60.0 Localized edema M25.00 Chronic systolic (congestive) heart failure I42.0 Dilated cardiomyopathy E66.01 Morbid (severe) obesity due to excess calories I10 Essential (primary) hypertension Facility Procedures CPT4 Code Description Modifier Quantity [id number] (Facility Use Only) [phone] - [place] UUEKCM LWR LT LEG 1 ICD-10 Diagnosis Description 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d Electronic Signature(s) Signed: 02/28/2022 1:33:14 PM By: Fredirick Maudlin MD FACS Signed: 03/03/2022 5:12:19 PM By: Blanche East RN Entered By: Blanche East on 02/28/2022 12:53:37

## 2022-03-03 NOTE — Progress Notes (Signed)
Clinton, [name] ([id number]) Visit Report for 02/28/2022 Arrival Information Details Patient Name: Date of Service: [name] RD O. 02/28/2022 12:00 PM Medical Record Number: [id number] Patient Account Number: [id number] Date of Birth/Sex: Treating RN: [birth year] (66 y.o. [name] Primary Care Courtnie Brenes: [name] Other Clinician: Referring Matie Dimaano: Treating [name]/Extender: [name] in Treatment: 0 Visit Information History Since Last Visit Added or deleted any medications: No Patient Arrived: Ambulatory Any new allergies or adverse reactions: No Arrival Time: 12:14 Had a fall or experienced change in No Accompanied By: self activities of daily living that may affect Transfer Assistance: None risk of falls: Patient Identification Verified: Yes Signs or symptoms of abuse/neglect since last visito No Secondary Verification Process Completed: Yes Hospitalized since last visit: No Patient Requires Transmission-Based Precautions: No Implantable device outside of the clinic excluding No Patient Has Alerts: No cellular tissue based products placed in the center since last visit: Has Dressing in Place as Prescribed: Yes Pain Present Now: Yes Electronic Signature(s) Signed: 03/03/2022 5:12:19 PM By: Blanche East RN Entered By: Blanche East on 02/28/2022 12:17:27 -------------------------------------------------------------------------------- Compression Therapy Details Patient Name: Date of Service: [name] RD O. 02/28/2022 12:00 PM Medical Record Number: [id number] Patient Account Number: [id number] Date of Birth/Sex: Treating RN: October 03, 1955 (66 y.o. [name] Primary Care Amazing Cowman: [name] Other Clinician: Referring [name]: Treating Chidi Shirer/Extender: [name] in Treatment: 0 Compression Therapy Performed for Wound Assessment: Wound #1 Anterior Lower Leg Performed By:  Clinician Blanche East, RN Compression Type: Three Layer Electronic Signature(s) Signed: 03/03/2022 5:12:19 PM By: Blanche East RN Entered By: Blanche East on 02/28/2022 12:46:05 -------------------------------------------------------------------------------- Encounter Discharge Information Details Patient Name: Date of Service: [name] RD O. 02/28/2022 12:00 PM Medical Record Number: [id number] Patient Account Number: [id number] Date of Birth/Sex: Treating RN: 29-Aug-1955 (66 y.o. [name] Primary Care [name]: Other Clinician: [name] Referring [name]: Treating [name]/Extender: [name] in Treatment: 0 Encounter Discharge Information Items Discharge Condition: Stable Ambulatory Status: Ambulatory Discharge Destination: Home Transportation: Private Auto Accompanied By: self Schedule Follow-up Appointment: Yes Clinical Summary of Care: Electronic Signature(s) Signed: 03/03/2022 5:12:19 PM By: Blanche East RN Entered By: Blanche East on 02/28/2022 12:48:06 -------------------------------------------------------------------------------- Patient/Caregiver Education Details Patient Name: Date of Service: [name] RD O. 8/25/2023andnbsp12:00 PM Medical Record Number: [id number] Patient Account Number: [id number] Date of Birth/Gender: Treating RN: 21-Aug-1955 (66 y.o. [name] Primary Care Physician: [name] Other Clinician: Referring Physician: Treating Physician/Extender: [name] in Treatment: 0 Education Assessment Education Provided To: Patient Education Topics Provided Welcome T [facility] [place]: o Methods: Explain/Verbal Responses: Reinforcements needed, State content correctly Electronic Signature(s) Signed: 03/03/2022 5:12:19 PM By: Blanche East RN Entered By: Blanche East on 02/28/2022  12:47:45 -------------------------------------------------------------------------------- Wound Assessment Details Patient Name: Date of Service: [name] RD O. 02/28/2022 12:00 PM Medical Record Number: [id number] Patient Account Number: [id number] Date of Birth/Sex: Treating RN: 01-May-1956 (66 y.o. [name] Primary Care [name]: [name] Other Clinician: Referring [name]: Treating [name]/Extender: [name] in Treatment: 0 Wound Status Wound Number: 1 Primary Etiology: Venous Leg Ulcer Wound Location: Anterior Lower Leg Wound Status: Open Wounding Event: Blister Date Acquired: 09/25/2021 Weeks Of Treatment: 0 Clustered Wound: Yes Wound Measurements Length: (cm) 2 Width: (cm) 3.2 Depth: (cm) 0.1 Area: (cm) 5.027 Volume: (cm) 0.503 % Reduction in Area: 0% % Reduction in Volume: 0% Wound Description  Classification: Full Thickness Without Exposed Support Structu Exudate Amount: Large Exudate Type: Serous Exudate Color: amber res Electronic Signature(s) Signed: 03/03/2022 5:12:19 PM By: Blanche East RN Entered By: Blanche East on 02/28/2022 12:17:53 -------------------------------------------------------------------------------- Vitals Details Patient Name: Date of Service: [name] RD O. 02/28/2022 12:00 PM Medical Record Number: [id number] Patient Account Number: [id number] Date of Birth/Sex: Treating RN: [birth year] (66 y.o. [name] Primary Care [name]: [name] Other Clinician: Referring [name]: Treating [name]/Extender: [name] in Treatment: 0 Vital Signs Time Taken: 12:17 Temperature (F): 97.9 Height (in): 76 Pulse (bpm): 90 Weight (lbs): 322 Respiratory Rate (breaths/min): 20 Body Mass Index (BMI): 39.2 Blood Pressure (mmHg): 164/95 Reference Range: 80 - 120 mg / dl Electronic Signature(s) Signed: 03/03/2022 5:12:19 PM By: Blanche East  RN Entered By: Blanche East on 02/28/2022 12:17:44

## 2022-03-04 NOTE — Progress Notes (Signed)
[name], [name] ([id number]) Visit Report for 03/03/2022 Arrival Information Details Patient Name: Date of Service: Clinton Liner RD O. 03/03/2022 3:30 PM Medical Record Number: [id number] Patient Account Number: [id number] Date of Birth/Sex: Treating RN: 1956/01/20 (66 y.o. Ulyses Amor, [name] Primary Care Yoselin Amerman: [name] Other Clinician: Referring Norberto Wishon: Treating Jourdan Maldonado/Extender: [name] in Treatment: 1 Visit Information History Since Last Visit Added or deleted any medications: No Patient Arrived: Ambulatory Any new allergies or adverse reactions: No Arrival Time: 15:22 Had a fall or experienced change in No Accompanied By: self activities of daily living that may affect Transfer Assistance: None risk of falls: Patient Identification Verified: Yes Signs or symptoms of abuse/neglect since last visito No Secondary Verification Process Completed: Yes Hospitalized since last visit: No Patient Requires Transmission-Based Precautions: No Implantable device outside of the clinic excluding No Patient Has Alerts: No cellular tissue based products placed in the center since last visit: Has Dressing in Place as Prescribed: Yes Pain Present Now: Yes Electronic Signature(s) Signed: 03/03/2022 4:41:46 PM By: Erenest Blank Entered By: Erenest Blank on 03/03/2022 15:23:43 -------------------------------------------------------------------------------- Compression Therapy Details Patient Name: Date of Service: Clinton Liner RD O. 03/03/2022 3:30 PM Medical Record Number: [id number] Patient Account Number: [id number] Date of Birth/Sex: Treating RN: 1955-10-10 (66 y.o. [name] Primary Care Jasmarie Coppock: [name] Other Clinician: Referring Demitrius Crass: Treating Jessilynn Taft/Extender: [name] in Treatment: 1 Compression Therapy Performed for Wound Assessment: Wound #1 Anterior Lower Leg Performed By:  Clinician Blanche East, RN Compression Type: Three Layer Post Procedure Diagnosis Same as Pre-procedure Electronic Signature(s) Signed: 03/03/2022 5:12:19 PM By: Blanche East RN Entered By: Blanche East on 03/03/2022 16:04:22 -------------------------------------------------------------------------------- Encounter Discharge Information Details Patient Name: Date of Service: Clinton Liner RD O. 03/03/2022 3:30 PM Medical Record Number: [id number] Patient Account Number: [id number] Date of Birth/Sex: Treating RN: 11-01-1955 (66 y.o. [name] Primary Care Harlow Basley: [name] Other Clinician: Referring Daana Petrasek: Treating Camaron Cammack/Extender: [name] in Treatment: 1 Encounter Discharge Information Items Post Procedure Vitals Discharge Condition: Stable Temperature (F): 97.7 Ambulatory Status: Ambulatory Pulse (bpm): 74 Discharge Destination: Home Respiratory Rate (breaths/min): 18 Transportation: Private Auto Blood Pressure (mmHg): 116/66 Accompanied By: self Schedule Follow-up Appointment: Yes Clinical Summary of Care: Electronic Signature(s) Signed: 03/03/2022 5:12:19 PM By: Blanche East RN Entered By: Blanche East on 03/03/2022 16:45:54 -------------------------------------------------------------------------------- Lower Extremity Assessment Details Patient Name: Date of Service: Clinton Liner RD O. 03/03/2022 3:30 PM Medical Record Number: [id number] Patient Account Number: [id number] Date of Birth/Sex: Treating RN: [birth year] (66 y.o. [name] Primary Care Jilian West: [name] Other Clinician: Referring Mery Guadalupe: Treating Waylen Depaolo/Extender: [name] in Treatment: 1 Edema Assessment Assessed: [Left: No] [Right: No] E[Left: dema] [Right: :] Calf Left: Right: Point of Measurement: From Medial Instep 55.5 cm Ankle Left: Right: Point of Measurement: From Medial Instep 29  cm Vascular Assessment Pulses: Dorsalis Pedis Palpable: [Left:Yes] Electronic Signature(s) Signed: 03/03/2022 4:41:46 PM By: Erenest Blank Signed: 03/04/2022 6:08:33 PM By: Baruch Gouty RN, BSN Entered By: Erenest Blank on 03/03/2022 15:48:51 -------------------------------------------------------------------------------- Multi Wound Chart Details Patient Name: Date of Service: Clinton Liner RD O. 03/03/2022 3:30 PM Medical Record Number: [id number] Patient Account Number: [id number] Date of Birth/Sex: Treating RN: [birth year] (66 y.o. [name] Primary Care Kamori Kitchens: [name] Other Clinician: Referring Hayk Divis: Treating Amar Keenum/Extender: [name] in Treatment: 1 Vital Signs Height(in): 76 Pulse(bpm): 74 Weight(lbs): 322 Blood Pressure(mmHg): 116/66  Body Mass Index(BMI): 39.2 Temperature(F): 97.7 Respiratory Rate(breaths/min): 18 Photos: [1:No Photos Anterior Lower Leg] [N/A:N/A N/A] Wound Location: [1:Blister] [N/A:N/A] Wounding Event: [1:Venous Leg Ulcer] [N/A:N/A] Primary Etiology: [1:Cataracts, Lymphedema, Sleep] [N/A:N/A] Comorbid History: [1:Apnea, Congestive Heart Failure, Coronary Artery Disease, Hypertension, Confinement Anxiety 09/25/2021] [N/A:N/A] Date Acquired: [1:1] [N/A:N/A] Weeks of Treatment: [1:Open] [N/A:N/A] Wound Status: [1:No] [N/A:N/A] Wound Recurrence: [1:Yes] [N/A:N/A] Clustered Wound: [1:10x8.5x0.1] [N/A:N/A] Measurements L x W x D (cm) [1:66.759] [N/A:N/A] A (cm) : rea [1:6.676] [N/A:N/A] Volume (cm) : [1:-1228.00%] [N/A:N/A] % Reduction in A rea: [1:-1227.20%] [N/A:N/A] % Reduction in Volume: [1:Full Thickness Without Exposed] [N/A:N/A] Classification: [1:Support Structures Large] [N/A:N/A] Exudate A mount: [1:Serous] [N/A:N/A] Exudate Type: [1:amber] [N/A:N/A] Exudate Color: [1:None Present (0%)] [N/A:N/A] Granulation A mount: [1:Debridement - Excisional]  [N/A:N/A] Debridement: Pre-procedure Verification/Time Out 15:58 [N/A:N/A] Taken: [1:Lidocaine 4% Topical Solution] [N/A:N/A] Pain Control: [1:Subcutaneous, Slough] [N/A:N/A] Tissue Debrided: [1:Skin/Subcutaneous Tissue] [N/A:N/A] Level: [1:85] [N/A:N/A] Debridement A (sq cm): [1:rea Curette, Forceps, Scissors] [N/A:N/A] Instrument: [1:Minimum] [N/A:N/A] Bleeding: [1:Pressure] [N/A:N/A] Hemostasis A chieved: [1:0] [N/A:N/A] Procedural Pain: [1:0] [N/A:N/A] Post Procedural Pain: [1:Procedure was tolerated well] [N/A:N/A] Debridement Treatment Response: [1:21x11x0.1] [N/A:N/A] Post Debridement Measurements L x W x D (cm) [1:18.143] [N/A:N/A] Post Debridement Volume: (cm) [1:Compression Therapy] [N/A:N/A] Procedures Performed: [1:Debridement] Treatment Notes Wound #1 (Lower Leg) Wound Laterality: Anterior Cleanser Soap and Water Discharge Instruction: May shower and wash wound with dial antibacterial soap and water prior to dressing change. Peri-Wound Care Sween Lotion (Moisturizing lotion) Discharge Instruction: Apply moisturizing lotion as directed Topical Mupirocin Ointment Discharge Instruction: Apply Mupirocin (Bactroban) as instructed Primary Dressing KerraCel Ag Gelling Fiber Dressing, 4x5 in (silver alginate) Discharge Instruction: Apply silver alginate to wound bed as instructed Secondary Dressing Zetuvit Plus Silicone Border Dressing 4x4 (in/in) Discharge Instruction: Apply silicone border over primary dressing as directed. Secured With [employer] Surgical T 2x10 (in/yd) ape Discharge Instruction: Secure with tape as directed. Compression Wrap ThreePress (3 layer compression wrap) Discharge Instruction: Apply three layer compression as directed. Compression Stockings Add-Ons Electronic Signature(s) Signed: 03/03/2022 4:50:43 PM By: Fredirick Maudlin MD FACS Signed: 03/04/2022 6:08:33 PM By: Baruch Gouty RN, BSN Entered By: Fredirick Maudlin on  03/03/2022 16:50:43 -------------------------------------------------------------------------------- Multi-Disciplinary Care Plan Details Patient Name: Date of Service: Clinton Liner RD O. 03/03/2022 3:30 PM Medical Record Number: [id number] Patient Account Number: [id number] Date of Birth/Sex: Treating RN: 02-22-1956 (66 y.o. [name] Primary Care Madicyn Mesina: [name] Other Clinician: Referring Maverick Patman: Treating Devoiry Corriher/Extender: [name] in Treatment: 1 Active Inactive Orientation to the Wound Care Program Nursing Diagnoses: Knowledge deficit related to the wound healing center program Goals: Patient/caregiver will verbalize understanding of the [place] Program Date Initiated: 02/24/2022 Target Resolution Date: 03/15/2022 Goal Status: Active Interventions: Provide education on orientation to the wound center Notes: Venous Leg Ulcer Nursing Diagnoses: Potential for venous Insuffiency (use before diagnosis confirmed) Goals: Patient will maintain optimal edema control Date Initiated: 02/24/2022 Target Resolution Date: 03/31/2022 Goal Status: Active Interventions: Assess peripheral edema status every visit. Compression as ordered Notes: Wound/Skin Impairment Nursing Diagnoses: Impaired tissue integrity Goals: Ulcer/skin breakdown will have a volume reduction of 30% by week 4 Date Initiated: 02/24/2022 Target Resolution Date: 03/31/2022 Goal Status: Active Interventions: Assess patient/caregiver ability to perform ulcer/skin care regimen upon admission and as needed Treatment Activities: Topical wound management initiated : 02/24/2022 Notes: Electronic Signature(s) Signed: 03/03/2022 5:12:19 PM By: Blanche East RN Entered By: Blanche East on 03/03/2022 16:04:32 -------------------------------------------------------------------------------- Pain Assessment Details Patient Name: Date of Service: [name]  [name]  RD O. 03/03/2022 3:30 PM Medical Record Number: [id number] Patient Account Number: [id number] Date of Birth/Sex: Treating RN: [birth year] (66 y.o. [name] Primary Care Luvada Salamone: [name] Other Clinician: Referring Markeria Goetsch: Treating Margaret Cockerill/Extender: [name] in Treatment: 1 Active Problems Location of Pain Severity and Description of Pain Patient Has Paino Yes Site Locations Pain Location: Pain in Ulcers Rate the pain. Current Pain Level: 8 Pain Management and Medication Current Pain Management: Electronic Signature(s) Signed: 03/03/2022 4:41:46 PM By: Erenest Blank Signed: 03/04/2022 6:08:33 PM By: Baruch Gouty RN, BSN Entered By: Erenest Blank on 03/03/2022 15:24:07 -------------------------------------------------------------------------------- Patient/Caregiver Education Details Patient Name: Date of Service: Clinton Liner RD Jenetta Downer 8/28/2023andnbsp3:30 PM Medical Record Number: [id number] Patient Account Number: [id number] Date of Birth/Gender: Treating RN: 04-Nov-1955 (65 y.o. [name] Primary Care Physician: [name] Other Clinician: Referring Physician: Treating Physician/Extender: [name] in Treatment: 1 Education Assessment Education Provided To: Patient Education Topics Provided Wound/Skin Impairment: Methods: Explain/Verbal Responses: Reinforcements needed, State content correctly Electronic Signature(s) Signed: 03/03/2022 5:12:19 PM By: Blanche East RN Entered By: Blanche East on 03/03/2022 16:04:58 -------------------------------------------------------------------------------- Wound Assessment Details Patient Name: Date of Service: Clinton Liner RD O. 03/03/2022 3:30 PM Medical Record Number: [id number] Patient Account Number: [id number] Date of Birth/Sex: Treating RN: 28-Jan-1956 (66 y.o. [name] Primary Care Artemis Koller: [name] Other  Clinician: Referring Mahdiya Mossberg: Treating Noriko Macari/Extender: [name] in Treatment: 1 Wound Status Wound Number: 1 Primary Venous Leg Ulcer Etiology: Wound Location: Anterior Lower Leg Wound Open Wounding Event: Blister Status: Date Acquired: 09/25/2021 Comorbid Cataracts, Lymphedema, Sleep Apnea, Congestive Heart Failure, Weeks Of Treatment: 1 History: Coronary Artery Disease, Hypertension, Confinement Anxiety Clustered Wound: Yes Photos Photo Uploaded By: Donavan Burnet on 03/04/2022 18:01:13 Wound Measurements Length: (cm) 10 Width: (cm) 8.5 Depth: (cm) 0.1 Area: (cm) 66.759 Volume: (cm) 6.676 % Reduction in Area: -1228% % Reduction in Volume: -1227.2% Tunneling: No Undermining: No Wound Description Classification: Full Thickness Without Exposed Support Struct Exudate Amount: Large Exudate Type: Serous Exudate Color: amber ures Wound Bed Granulation Amount: None Present (0%) Treatment Notes Wound #1 (Lower Leg) Wound Laterality: Anterior Cleanser Soap and Water Discharge Instruction: May shower and wash wound with dial antibacterial soap and water prior to dressing change. Peri-Wound Care Sween Lotion (Moisturizing lotion) Discharge Instruction: Apply moisturizing lotion as directed Topical Mupirocin Ointment Discharge Instruction: Apply Mupirocin (Bactroban) as instructed Primary Dressing KerraCel Ag Gelling Fiber Dressing, 4x5 in (silver alginate) Discharge Instruction: Apply silver alginate to wound bed as instructed Secondary Dressing Zetuvit Plus Silicone Border Dressing 4x4 (in/in) Discharge Instruction: Apply silicone border over primary dressing as directed. Secured With [employer] Surgical T 2x10 (in/yd) ape Discharge Instruction: Secure with tape as directed. Compression Wrap ThreePress (3 layer compression wrap) Discharge Instruction: Apply three layer compression as directed. Compression  Stockings Add-Ons Electronic Signature(s) Signed: 03/03/2022 5:12:19 PM By: Blanche East RN Entered By: Blanche East on 03/03/2022 16:09:50 -------------------------------------------------------------------------------- Vitals Details Patient Name: Date of Service: Clinton Liner RD O. 03/03/2022 3:30 PM Medical Record Number: [id number] Patient Account Number: [id number] Date of Birth/Sex: Treating RN: 1955-09-11 (66 y.o. [name] Primary Care Eura Radabaugh: [name] Other Clinician: Referring Kayzlee Wirtanen: Treating Dareth Andrew/Extender: [name] in Treatment: 1 Vital Signs Time Taken: 15:24 Temperature (F): 97.7 Height (in): 76 Pulse (bpm): 74 Weight (lbs): 322 Respiratory Rate (breaths/min): 18 Body Mass Index (BMI): 39.2 Blood Pressure (mmHg): 116/66 Reference  Range: 80 - 120 mg / dl Electronic Signature(s) Signed: 03/03/2022 4:41:46 PM By: Erenest Blank Entered By: Erenest Blank on 03/03/2022 15:26:54

## 2022-03-04 NOTE — Progress Notes (Addendum)
BRISTON, HANCHEY ([id number]) [id number] Page 1 of 9 Visit Report for 03/03/2022 Chief Complaint Document Details Patient Name: Date of Service: [name] RD O. 03/03/2022 3:30 PM Medical Record Number: [id number] Patient Account Number: [id number] Date of Birth/Sex: Treating RN: 1956-05-23 (66 y.o. [name] Primary Care Provider: [name] Other Clinician: Referring Provider: Treating Provider/Extender: [name] in Treatment: 1 Information Obtained from: Patient Chief Complaint Patient presents for treatment of an open ulcer due to venous insufficiency Electronic Signature(s) Signed: 03/03/2022 4:50:49 PM By: Fredirick Maudlin MD FACS Entered By: Fredirick Maudlin on 03/03/2022 16:50:48 -------------------------------------------------------------------------------- Debridement Details Patient Name: Date of Service: [name] RD O. 03/03/2022 3:30 PM Medical Record Number: [id number] Patient Account Number: [id number] Date of Birth/Sex: Treating RN: [birth year] (66 y.o. [name] Session Primary Care Provider: [name] Other Clinician: Referring Provider: Treating Provider/Extender: [name] in Treatment: 1 Debridement Performed for Assessment: Wound #1 Anterior Lower Leg Performed By: Physician Fredirick Maudlin, MD Debridement Type: Debridement Severity of Tissue Pre Debridement: Fat layer exposed Level of Consciousness (Pre-procedure): Awake and Alert Pre-procedure Verification/Time Out Yes - 15:58 Taken: Start Time: 15:59 Pain Control: Lidocaine 4% T opical Solution T Area Debrided (L x W): otal 10 (cm) x 8.5 (cm) = 85 (cm) Tissue and other material debrided: Viable, Non-Viable, Slough, Subcutaneous, Slough Level: Skin/Subcutaneous Tissue Debridement Description: Excisional Instrument: Curette, Forceps, Scissors Bleeding: Minimum Hemostasis Achieved:  Pressure Procedural Pain: 0 Post Procedural Pain: 0 Response to Treatment: Procedure was tolerated well Level of Consciousness (Post- Awake and Alert procedure): Post Debridement Measurements of Total Wound Length: (cm) 21 Width: (cm) 11 Depth: (cm) 0.1 Volume: (cm) 18.143 Character of Wound/Ulcer Post Debridement: Requires Further Debridement Severity of Tissue Post Debridement: Fat layer exposed Post Procedure Diagnosis Same as Pre-procedure Electronic Signature(s) Signed: 03/03/2022 4:56:31 PM By: Fredirick Maudlin MD FACS Signed: 03/03/2022 5:12:19 PM By: Blanche East RN Entered By: Blanche East on 03/03/2022 16:10:05 [name] ([id number].pdf Page 2 of 9 -------------------------------------------------------------------------------- HPI Details Patient Name: Date of Service: [name] RD O. 03/03/2022 3:30 PM Medical Record Number: [id number] Patient Account Number: [id number] Date of Birth/Sex: Treating RN: Jun 02, 1956 (66 y.o. [name] Primary Care Provider: [name] Other Clinician: Referring Provider: Treating Provider/Extender: [name] in Treatment: 1 History of Present Illness HPI Description: ADMISSION 02/24/2022 This is a 66 year old man with a past medical history significant for congestive heart failure, morbid obesity, dilated cardiomyopathy, atrial fibrillation, and lower extremity cellulitis. He is currently being treated with Keflex. He is not diabetic. He does not smoke. He says that he developed some blisters on his left anterior tibial surface which subsequently broke open causing the wounds for which she is here to see [place] today. He says that he occasionally wears compression stockings but does not do so on a regular basis. He reports that "they look stupid with shorts." ABI in clinic today was 0.94. On the left anterior tibial surface, there are several small  wounds in a geographic pattern. They do appear consistent with blisters that have ruptured. The fat layer is exposed and there is a thin layer of slough on the surfaces. He does have some surrounding erythema, but no purulent drainage. Edema control is very poor. 03/03/2022: The patient came in for a nurse visit last week because he thought the compression wraps were too tight. They were reapplied and apparently he did not understand the instruction to try  to stay off his feet is much as possible and keep his leg elevated; he instead walked excessively on both Friday and Saturday. As result his leg became more swollen, the wrap became uncomfortable and he cut it off yesterday. He replaced it with a wrap of his own fashion. Today he has an indentation in his leg where his homemade wrap ended. He has extensive 3+ nonpitting edema above this. The wounds themselves are in fairly decent condition with just a little bit of slough on the surface, but edema fluid is frankly pouring out of the largest wound. Electronic Signature(s) Signed: 03/03/2022 4:53:03 PM By: Fredirick Maudlin MD FACS Entered By: Fredirick Maudlin on 03/03/2022 16:53:02 -------------------------------------------------------------------------------- Physical Exam Details Patient Name: Date of Service: [name] RD O. 03/03/2022 3:30 PM Medical Record Number: [id number] Patient Account Number: [id number] Date of Birth/Sex: Treating RN: August 27, 1955 (66 y.o. [name] Primary Care Provider: [name] Other Clinician: Referring Provider: Treating Provider/Extender: [name] in Treatment: 1 Constitutional . . . . No acute distress.Marland Kitchen Respiratory Normal work of breathing on room air.. Notes 03/03/2022: T oday he has an indentation in his leg where his homemade wrap ended. He has extensive 3+ nonpitting edema above this. The wounds themselves are in fairly decent condition with just a little  bit of slough on the surface, but edema fluid is frankly pouring out of the largest wound. Electronic Signature(s) Signed: 03/03/2022 4:53:39 PM By: Fredirick Maudlin MD FACS Entered By: Fredirick Maudlin on 03/03/2022 16:53:39 -------------------------------------------------------------------------------- Physician Orders Details Patient Name: Date of Service: [name] RD O. 03/03/2022 3:30 PM Medical Record Number: [id number] Patient Account Number: [id number] Date of Birth/Sex: Treating RN: 1956-03-06 (66 y.o. [name] Session Primary Care Provider: [name] Other Clinician: Referring Provider: Treating Provider/Extender: [name] in Treatment: 1 Verbal / Phone Orders: No JOHNSE, ROMANO ([id number]) [id number] Page 3 of 9 Diagnosis Coding ICD-10 Coding Code Description [phone] Non-pressure chronic ulcer of other part of left lower leg with fat layer exposed R60.0 Localized edema XX123456 Chronic systolic (congestive) heart failure I42.0 Dilated cardiomyopathy E66.01 Morbid (severe) obesity due to excess calories I10 Essential (primary) hypertension Follow-up Appointments ppointment in 1 week. - Dr. Celine Ahr Rm 4 Return A Friday 03/14/22 at 08:00AM Nurse Visit: - Room 2 Tuesday 9/5/ 23 at 11:30 Anesthetic Wound #1 Anterior Lower Leg (In clinic) Topical Lidocaine 4% applied to wound bed Bathing/ Shower/ Hygiene May shower with protection but do not get wound dressing(s) wet. - use cast protector on Left lower leg Edema Control - Lymphedema / SCD / Other Avoid standing for long periods of time. Patient to wear own compression stockings every day. - wear compression stockings Right leg Wound Treatment Wound #1 - Lower Leg Wound Laterality: Anterior Cleanser: Soap and Water Discharge Instructions: May shower and wash wound with dial antibacterial soap and water prior to dressing change. Peri-Wound Care: Sween  Lotion (Moisturizing lotion) Discharge Instructions: Apply moisturizing lotion as directed Topical: Mupirocin Ointment Discharge Instructions: Apply Mupirocin (Bactroban) as instructed Prim Dressing: KerraCel Ag Gelling Fiber Dressing, 4x5 in (silver alginate) ary Discharge Instructions: Apply silver alginate to wound bed as instructed Secondary Dressing: Zetuvit Plus Silicone Border Dressing 4x4 (in/in) Discharge Instructions: Apply silicone border over primary dressing as directed. Secured With: 59M Medipore Public affairs consultant Surgical T 2x10 (in/yd) ape Discharge Instructions: Secure with tape as directed. Compression Wrap: ThreePress (3 layer compression wrap) Discharge Instructions: Apply three layer compression as directed. Electronic  Signature(s) Signed: 03/07/2022 9:28:33 AM By: Fredirick Maudlin MD FACS Signed: 09/01/2022 4:58:42 PM By: Blanche East RN Previous Signature: 03/03/2022 4:56:31 PM Version By: Fredirick Maudlin MD FACS Entered By: Blanche East on 03/07/2022 07:58:10 -------------------------------------------------------------------------------- Problem List Details Patient Name: Date of Service: [name] RD O. 03/03/2022 3:30 PM Medical Record Number: [id number] Patient Account Number: [id number] Date of Birth/Sex: Treating RN: 1955-09-22 (66 y.o. [name] Session Primary Care Provider: [name] Other Clinician: Referring Provider: Treating Provider/Extender: [name] in Treatment: [address] [place], [name] ([id number]) [id number] Page 4 of 9 ICD-10 Encounter Code Description Active Date MDM Diagnosis 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d 02/24/2022 No Yes R60.0 Localized edema 02/24/2022 No Yes [id number] Chronic systolic (congestive) heart failure 02/24/2022 No Yes I42.0 Dilated cardiomyopathy 02/24/2022 No Yes E66.01 Morbid (severe) obesity due  to excess calories 02/24/2022 No Yes I10 Essential (primary) hypertension 02/24/2022 No Yes Inactive Problems Resolved Problems Electronic Signature(s) Signed: 03/03/2022 4:48:02 PM By: Fredirick Maudlin MD FACS Entered By: Fredirick Maudlin on 03/03/2022 16:48:01 -------------------------------------------------------------------------------- Progress Note Details Patient Name: Date of Service: [name] RD O. 03/03/2022 3:30 PM Medical Record Number: [id number] Patient Account Number: [id number] Date of Birth/Sex: Treating RN: [birth year] (65 y.o. [name] Primary Care Provider: [name] Other Clinician: Referring Provider: Treating Provider/Extender: [name] in Treatment: 1 Subjective Chief Complaint Information obtained from Patient Patient presents for treatment of an open ulcer due to venous insufficiency History of Present Illness (HPI) ADMISSION 02/24/2022 This is a 66 year old man with a past medical history significant for congestive heart failure, morbid obesity, dilated cardiomyopathy, atrial fibrillation, and lower extremity cellulitis. He is currently being treated with Keflex. He is not diabetic. He does not smoke. He says that he developed some blisters on his left anterior tibial surface which subsequently broke open causing the wounds for which she is here to see [place] today. He says that he occasionally wears compression stockings but does not do so on a regular basis. He reports that "they look stupid with shorts." ABI in clinic today was 0.94. On the left anterior tibial surface, there are several small wounds in a geographic pattern. They do appear consistent with blisters that have ruptured. The fat layer is exposed and there is a thin layer of slough on the surfaces. He does have some surrounding erythema, but no purulent drainage. Edema control is very poor. 03/03/2022: The patient came in for a nurse visit last week  because he thought the compression wraps were too tight. They were reapplied and apparently he did not understand the instruction to try to stay off his feet is much as possible and keep his leg elevated; he instead walked excessively on both Friday and Saturday. As result his leg became more swollen, the wrap became uncomfortable and he cut it off yesterday. He replaced it with a wrap of his own fashion. Today he has an indentation in his leg where his homemade wrap ended. He has extensive 3+ nonpitting edema above this. The wounds themselves are in fairly decent condition with just a little bit of slough on the surface, but edema fluid is frankly pouring out of the largest wound. Patient History Information obtained from Patient. Family History Cancer - Father,Mother, Hypertension - Mother,Father, DONOVEN, RYBA ([id number]) [id number] Page 5 of 9 No family history of Diabetes, Heart Disease, Hereditary Spherocytosis, Kidney Disease, Lung Disease, Seizures, Thyroid  Problems, Tuberculosis. 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 Medical History Eyes Patient has history of Cataracts - 2021 Ear/Nose/Mouth/Throat Denies history of Chronic sinus problems/congestion, Middle ear problems Hematologic/Lymphatic Patient has history of Lymphedema Respiratory Patient has history of Sleep Apnea Cardiovascular Patient has history of Congestive Heart Failure, Coronary Artery Disease, Hypertension Denies history of Deep Vein Thrombosis Gastrointestinal Denies history of Cirrhosis , Colitis, Crohnoos Endocrine Denies history of Type I Diabetes, Type II Diabetes Genitourinary Denies history of End Stage Renal Disease Immunological Denies history of Raynaudoos, Scleroderma Integumentary (Skin) Denies history of History of Burn Musculoskeletal Denies history of Gout,  Rheumatoid Arthritis, Osteoarthritis, Osteomyelitis Neurologic Denies history of Dementia, Neuropathy, Quadriplegia, Paraplegia, Seizure Disorder Oncologic Denies history of Received Chemotherapy, Received Radiation Psychiatric Patient has history of Confinement Anxiety Denies history of Anorexia/bulimia Hospitalization/Surgery History - Inguinal Hernia repair- 2014. - insertion of mesh-2014. - Atrial ablation surgery- 2007. - BIla eyes lasik- 2008. - hernia repair- 1977. Medical A Surgical History Notes nd Respiratory Allergic Rhinitis Cardiovascular AFIB Objective Constitutional No acute distress.. Vitals Time Taken: 3:24 PM, Height: 76 in, Weight: 322 lbs, BMI: 39.2, Temperature: 97.7 F, Pulse: 74 bpm, Respiratory Rate: 18 breaths/min, Blood Pressure: 116/66 mmHg. Respiratory Normal work of breathing on room air.. General Notes: 03/03/2022: Today he has an indentation in his leg where his homemade wrap ended. He has extensive 3+ nonpitting edema above this. The wounds themselves are in fairly decent condition with just a little bit of slough on the surface, but edema fluid is frankly pouring out of the largest wound. Integumentary (Hair, Skin) Wound #1 status is Open. Original cause of wound was Blister. The date acquired was: 09/25/2021. The wound has been in treatment 1 weeks. The wound is located on the Anterior Lower Leg. The wound measures 10cm length x 8.5cm width x 0.1cm depth; 66.759cm^2 area and 6.676cm^3 volume. There is no tunneling or undermining noted. There is a large amount of serous drainage noted. There is no granulation within the wound bed. Assessment Active Problems ICD-10 Non-pressure chronic ulcer of other part of left lower leg with fat layer exposed Localized edema Chronic systolic (congestive) heart failure Dilated cardiomyopathy Morbid (severe) obesity due to excess calories KEYSHAWN, SNIDE ([id number]) [id number] Page 6  of 9 Essential (primary) hypertension Procedures Wound #1 Pre-procedure diagnosis of Wound #1 is a Venous Leg Ulcer located on the Anterior Lower Leg .Severity of Tissue Pre Debridement is: Fat layer exposed. There was a Excisional Skin/Subcutaneous Tissue Debridement with a total area of 85 sq cm performed by Fredirick Maudlin, MD. With the following instrument(s): Curette, Forceps, and Scissors to remove Viable and Non-Viable tissue/material. Material removed includes Subcutaneous Tissue and Slough and after achieving pain control using Lidocaine 4% Topical Solution. No specimens were taken. A time out was conducted at 15:58, prior to the start of the procedure. A Minimum amount of bleeding was controlled with Pressure. The procedure was tolerated well with a pain level of 0 throughout and a pain level of 0 following the procedure. Post Debridement Measurements: 21cm length x 11cm width x 0.1cm depth; 18.143cm^3 volume. Character of Wound/Ulcer Post Debridement requires further debridement. Severity of Tissue Post Debridement is: Fat layer exposed. Post procedure Diagnosis Wound #1: Same as Pre-Procedure Pre-procedure diagnosis of Wound #1 is a Venous Leg Ulcer located on the Anterior Lower Leg . There was a Three Layer Compression Therapy Procedure by Zochol,  Roselyn Reef, RN. Post procedure Diagnosis Wound #1: Same as Pre-Procedure Plan Follow-up Appointments: Nurse Visit: - Room 4 Wednesday 8/30 at 7:30 AM Anesthetic: Wound #1 Anterior Lower Leg: (In clinic) Topical Lidocaine 4% applied to wound bed Bathing/ Shower/ Hygiene: May shower with protection but do not get wound dressing(s) wet. - use cast protector on Left lower leg Edema Control - Lymphedema / SCD / Other: Avoid standing for long periods of time. Patient to wear own compression stockings every day. - wear compression stockings Right leg WOUND #1: - Lower Leg Wound Laterality: Anterior Cleanser: Soap and Water Discharge  Instructions: May shower and wash wound with dial antibacterial soap and water prior to dressing change. Peri-Wound Care: Sween Lotion (Moisturizing lotion) Discharge Instructions: Apply moisturizing lotion as directed Topical: Mupirocin Ointment Discharge Instructions: Apply Mupirocin (Bactroban) as instructed Prim Dressing: KerraCel Ag Gelling Fiber Dressing, 4x5 in (silver alginate) ary Discharge Instructions: Apply silver alginate to wound bed as instructed Secondary Dressing: Zetuvit Plus Silicone Border Dressing 4x4 (in/in) Discharge Instructions: Apply silicone border over primary dressing as directed. Secured With: 54M Medipore Public affairs consultant Surgical T 2x10 (in/yd) ape Discharge Instructions: Secure with tape as directed. Com pression Wrap: ThreePress (3 layer compression wrap) Discharge Instructions: Apply three layer compression as directed. 03/03/2022: Today he has an indentation in his leg where his homemade wrap ended. He has extensive 3+ nonpitting edema above this. The wounds themselves are in fairly decent condition with just a little bit of slough on the surface, but edema fluid is frankly pouring out of the largest wound. I used a curette to remove slough and eschar as well as a little bit of nonviable subcutaneous tissue from his wounds. We had a frank discussion about the importance of keeping his wraps in place due to his extensive edema. We also clarified that he needs to stay off of his feet is much as possible and elevate his legs whenever he is able. We will continue using topical mupirocin with silver alginate and 3 layer compression. He did request a nurse visit for midweek and we will schedule that. I will see him in 1 week's time. Electronic Signature(s) Signed: 03/03/2022 4:55:57 PM By: Fredirick Maudlin MD FACS Entered By: Fredirick Maudlin on 03/03/2022 16:55:57 -------------------------------------------------------------------------------- HxROS Details Patient  Name: Date of Service: [name] RD O. 03/03/2022 3:30 PM Medical Record Number: [id number] Patient Account Number: [id number] Date of Birth/Sex: Treating RN: 1955-10-02 (66 y.o. [name] Primary Care Provider: [name] Other Clinician: Referring Provider: Treating Provider/Extender: [name] in Treatment: 1 TAIDEN, MARCHANT ([id number]) [id number] Page 7 of 9 Information Obtained From Patient Eyes Medical History: Positive for: Cataracts - 2021 Ear/Nose/Mouth/Throat Medical History: Negative for: Chronic sinus problems/congestion; Middle ear problems Hematologic/Lymphatic Medical History: Positive for: Lymphedema Respiratory Medical History: Positive for: Sleep Apnea Past Medical History Notes: Allergic Rhinitis Cardiovascular Medical History: Positive for: Congestive Heart Failure; Coronary Artery Disease; Hypertension Negative for: Deep Vein Thrombosis Past Medical History Notes: AFIB Gastrointestinal Medical History: Negative for: Cirrhosis ; Colitis; Crohns Endocrine Medical History: Negative for: Type I Diabetes; Type II Diabetes Genitourinary Medical History: Negative for: End Stage Renal Disease Immunological Medical History: Negative for: Raynauds; Scleroderma Integumentary (Skin) Medical History: Negative for: History of Burn Musculoskeletal Medical History: Negative for: Gout; Rheumatoid Arthritis; Osteoarthritis; Osteomyelitis Neurologic Medical History: Negative for: Dementia; Neuropathy; Quadriplegia; Paraplegia; Seizure Disorder Oncologic Medical History: Negative for: Received Chemotherapy; Received Radiation Psychiatric Medical History: Positive for: Confinement Anxiety Negative for: Anorexia/bulimia Syme,  [name] ([id number]) [id number] Page 8 of 9 HBO Extended History Items Eyes: Cataracts Immunizations Pneumococcal  Vaccine: Received Pneumococcal Vaccination: Yes Received Pneumococcal Vaccination On or After 60th Birthday: Yes Implantable Devices None Hospitalization / Surgery History Type of Hospitalization/Surgery Inguinal Hernia repair- 2014 insertion of mesh-2014 Atrial ablation surgery- 2007 BIla eyes lasik- 2008 hernia repair- 1977 Family and Social History Cancer: Yes - Father,Mother; Diabetes: No; Heart Disease: No; Hereditary Spherocytosis: No; Hypertension: Yes - Mother,Father; Kidney Disease: No; Lung Disease: No; Seizures: No; Thyroid Problems: No; Tuberculosis: No; Never smoker; Marital Status - Married; Alcohol Use: Never; Drug Use: No History; Caffeine Use: Daily - [employer] dew; Financial Concerns: No; Food, Clothing or Shelter Needs: No; Transportation Concerns: No Engineer, maintenance) Signed: 03/03/2022 4:56:31 PM By: Fredirick Maudlin MD FACS Signed: 03/04/2022 6:08:33 PM By: Baruch Gouty RN, BSN Entered By: Fredirick Maudlin on 03/03/2022 16:53:08 -------------------------------------------------------------------------------- SuperBill Details Patient Name: Date of Service: [name] RD O. 03/03/2022 Medical Record Number: [id number] Patient Account Number: [id number] Date of Birth/Sex: Treating RN: Feb 21, 1956 (66 y.o. [name] Session Primary Care Provider: [name] Other Clinician: Referring Provider: Treating Provider/Extender: [name] in Treatment: 1 Diagnosis Coding ICD-10 Codes Code Description [phone] Non-pressure chronic ulcer of other part of left lower leg with fat layer exposed R60.0 Localized edema [id number] Chronic systolic (congestive) heart failure I42.0 Dilated cardiomyopathy E66.01 Morbid (severe) obesity due to excess calories I10 Essential (primary) hypertension Facility Procedures : CPT4 Code: [id number] Description: [id number] - DEB SUBQ TISSUE 20 SQ CM/< ICD-10 Diagnosis Description L97.822 Non-pressure chronic ulcer  of other part of left lower leg with fat layer expo Modifier: sed Quantity: 1 : CPT4 Code: [id number] Description: [id number] - DEB SUBQ TISS EA ADDL 20CM ICD-10 Diagnosis Description L97.822 Non-pressure chronic ulcer of other part of left lower leg with fat layer expo Modifier: sed Quantity: 4 Physician Procedures : CPT4 Code Description Modifier [id number] - WC PHYS LEVEL 4 - EST PT 25 YIANNIS, SERRAO ([id number]) [id number] ICD-10 Diagnosis Description L97.822 Non-pressure chronic ulcer of other part of left lower leg with fat layer  exposed R60.0 Localized edema [id number] Chronic systolic (congestive) heart failure E66.01 Morbid (severe) obesity due to excess calories Quantity: 1 27.pdf Page 9 of 9 : [id number] - WC PHYS SUBQ TISS 20 SQ CM 1 ICD-10 Diagnosis Description 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d Quantity: : [id number] - WC PHYS SUBQ TISS EA ADDL 20 CM 4 ICD-10 Diagnosis Description 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d Quantity: Electronic Signature(s) Signed: 03/03/2022 4:56:18 PM By: Fredirick Maudlin MD FACS Entered By: Fredirick Maudlin on 03/03/2022 16:56:17

## 2022-03-05 ENCOUNTER — Encounter (HOSPITAL_BASED_OUTPATIENT_CLINIC_OR_DEPARTMENT_OTHER): Payer: 59 | Admitting: General Surgery

## 2022-03-05 DIAGNOSIS — L97822 Non-pressure chronic ulcer of other part of left lower leg with fat layer exposed: Secondary | ICD-10-CM | POA: Diagnosis not present

## 2022-03-05 NOTE — Progress Notes (Signed)
TAYQUAN, GASSMAN ([id number]) Visit Report for 03/05/2022 SuperBill Details Patient Name: Date of Service: Romero Liner RD Jenetta Downer 03/05/2022 Medical Record Number: [id number] Patient Account Number: [id number] Date of Birth/Sex: Treating RN: 08/01/1955 (66 y.o. [name] Primary Care Provider: [name] Other Clinician: Referring Provider: Treating Provider/Extender: [name] in Treatment: 1 Diagnosis Coding ICD-10 Codes Code Description [phone] Non-pressure chronic ulcer of other part of left lower leg with fat layer exposed R60.0 Localized edema D35.70 Chronic systolic (congestive) heart failure I42.0 Dilated cardiomyopathy E66.01 Morbid (severe) obesity due to excess calories I10 Essential (primary) hypertension Facility Procedures CPT4 Code Description Modifier Quantity [id number] (Facility Use Only) [phone] - [place] QTMAUQ LWR LT LEG 1 ICD-10 Diagnosis Description 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d Electronic Signature(s) Signed: 03/05/2022 10:05:10 AM By: Fredirick Maudlin MD FACS Signed: 03/05/2022 5:02:02 PM By: Blanche East RN Entered By: Blanche East on 03/05/2022 08:01:53

## 2022-03-05 NOTE — Progress Notes (Signed)
WOLF, BOULAY ([id number]) Visit Report for 03/05/2022 Arrival Information Details Patient Name: Date of Service: Clinton Liner RD O. 03/05/2022 7:30 A M Medical Record Number: [id number] Patient Account Number: [id number] Date of Birth/Sex: Treating RN: 12-Jun-1956 (66 y.o. [name] Primary Care Lissandro Dilorenzo: [name] Other Clinician: Referring Jewelle Whitner: Treating Jamiere Gulas/Extender: [name] in Treatment: 1 Visit Information History Since Last Visit All ordered tests and consults were completed: Yes Patient Arrived: Ambulatory Added or deleted any medications: No Arrival Time: 07:29 Any new allergies or adverse reactions: No Accompanied By: self Had a fall or experienced change in No Transfer Assistance: None activities of daily living that may affect Patient Identification Verified: Yes risk of falls: Secondary Verification Process Completed: Yes Signs or symptoms of abuse/neglect since last visito No Patient Requires Transmission-Based Precautions: No Hospitalized since last visit: No Patient Has Alerts: No Implantable device outside of the clinic excluding No cellular tissue based products placed in the center since last visit: Has Dressing in Place as Prescribed: Yes Has Compression in Place as Prescribed: Yes Pain Present Now: Yes Electronic Signature(s) Signed: 03/05/2022 5:02:02 PM By: Blanche East RN Entered By: Blanche East on 03/05/2022 08:00:58 -------------------------------------------------------------------------------- Compression Therapy Details Patient Name: Date of Service: Clinton Liner RD O. 03/05/2022 7:30 A M Medical Record Number: [id number] Patient Account Number: [id number] Date of Birth/Sex: Treating RN: 03/05/1956 (66 y.o. [name] Primary Care Johnesha Acheampong: [name] Other Clinician: Referring Gwyn Mehring: Treating Hazael Olveda/Extender: [name] in Treatment:  1 Compression Therapy Performed for Wound Assessment: Wound #1 Anterior Lower Leg Performed By: Clinician Blanche East, RN Compression Type: Three Hydrologist) Signed: 03/05/2022 5:02:02 PM By: Blanche East RN Entered By: Blanche East on 03/05/2022 07:34:11 -------------------------------------------------------------------------------- Encounter Discharge Information Details Patient Name: Date of Service: Clinton Liner RD O. 03/05/2022 7:30 A M Medical Record Number: [id number] Patient Account Number: [id number] Date of Birth/Sex: Treating RN: 02-Feb-1956 (66 y.o. [name] Primary Care Celestino Ackerman: [name] Other Clinician: Referring Caisen Mangas: Treating Ellasyn Swilling/Extender: [name] in Treatment: 1 Encounter Discharge Information Items Discharge Condition: Stable Ambulatory Status: Ambulatory Discharge Destination: Home Transportation: Private Auto Accompanied By: self Schedule Follow-up Appointment: Yes Clinical Summary of Care: Electronic Signature(s) Signed: 03/05/2022 5:02:02 PM By: Blanche East RN Entered By: Blanche East on 03/05/2022 08:01:32 -------------------------------------------------------------------------------- Patient/Caregiver Education Details Patient Name: Date of Service: Clinton Liner RD Jenetta Downer 8/30/2023andnbsp7:30 A M Medical Record Number: [id number] Patient Account Number: [id number] Date of Birth/Gender: Treating RN: [birth year] (66 y.o. [name] Primary Care Physician: [name] Other Clinician: Referring Physician: Treating Physician/Extender: [name] in Treatment: 1 Education Assessment Education Provided To: Patient Education Topics Provided Wound/Skin Impairment: Methods: Explain/Verbal Responses: Reinforcements needed, State content correctly Electronic Signature(s) Signed: 03/05/2022 5:02:02 PM By: Blanche East RN Entered By: Blanche East on 03/05/2022 08:01:20 -------------------------------------------------------------------------------- Wound Assessment Details Patient Name: Date of Service: Clinton Liner RD O. 03/05/2022 7:30 A M Medical Record Number: [id number] Patient Account Number: [id number] Date of Birth/Sex: Treating RN: 13-Aug-1955 (66 y.o. [name] Primary Care Makell Drohan: [name] Other Clinician: Referring Damarrion Mimbs: Treating Donice Alperin/Extender: [name] in Treatment: 1 Wound Status Wound Number: 1 Primary Venous Leg Ulcer Etiology: Wound Location: Anterior Lower Leg Wound Open Wounding Event: Blister Status: Date Acquired: 09/25/2021 Comorbid Cataracts, Lymphedema, Sleep Apnea, Congestive Heart Failure, Weeks Of Treatment: 1 History: Coronary Artery Disease, Hypertension, Confinement Anxiety Clustered Wound:  Yes Wound Measurements Length: (cm) 10 Width: (cm) 8.5 Depth: (cm) 0.1 Area: (cm) 66.759 Volume: (cm) 6.676 % Reduction in Area: -1228% % Reduction in Volume: -1227.2% Tunneling: No Undermining: No Wound Description Classification: Full Thickness Without Exposed Support Structures Wound Margin: Flat and Intact Exudate Amount: Large Exudate Type: Serous Exudate Color: amber Foul Odor After Cleansing: No Slough/Fibrino Yes Wound Bed Granulation Amount: Medium (34-66%) Exposed Structure Granulation Quality: Red, Pink Fascia Exposed: No Necrotic Amount: Medium (34-66%) Fat Layer (Subcutaneous Tissue) Exposed: Yes Necrotic Quality: Adherent Slough Tendon Exposed: No Muscle Exposed: No Joint Exposed: No Bone Exposed: No Treatment Notes Wound #1 (Lower Leg) Wound Laterality: Anterior Cleanser Soap and Water Discharge Instruction: May shower and wash wound with dial antibacterial soap and water prior to dressing change. Peri-Wound Care Sween Lotion (Moisturizing lotion) Discharge Instruction: Apply moisturizing lotion as  directed Topical Mupirocin Ointment Discharge Instruction: Apply Mupirocin (Bactroban) as instructed Primary Dressing KerraCel Ag Gelling Fiber Dressing, 4x5 in (silver alginate) Discharge Instruction: Apply silver alginate to wound bed as instructed Secondary Dressing Zetuvit Plus Silicone Border Dressing 4x4 (in/in) Discharge Instruction: Apply silicone border over primary dressing as directed. Secured With [employer] Surgical T 2x10 (in/yd) ape Discharge Instruction: Secure with tape as directed. Compression Wrap ThreePress (3 layer compression wrap) Discharge Instruction: Apply three layer compression as directed. Compression Stockings Add-Ons Electronic Signature(s) Signed: 03/05/2022 5:02:02 PM By: Blanche East RN Entered By: Blanche East on 03/05/2022 07:33:52 -------------------------------------------------------------------------------- Vitals Details Patient Name: Date of Service: Clinton Liner RD O. 03/05/2022 7:30 A M Medical Record Number: [id number] Patient Account Number: [id number] Date of Birth/Sex: Treating RN: Oct 01, 1955 (66 y.o. [name] Primary Care Johnetta Sloniker: [name] Other Clinician: Referring Davan Nawabi: Treating Elisabetta Mishra/Extender: [name] in Treatment: 1 Vital Signs Time Taken: 07:33 Temperature (F): 97.7 Height (in): 76 Pulse (bpm): 78 Weight (lbs): 322 Respiratory Rate (breaths/min): 18 Body Mass Index (BMI): 39.2 Blood Pressure (mmHg): 116/66 Reference Range: 80 - 120 mg / dl Electronic Signature(s) Signed: 03/05/2022 5:02:02 PM By: Blanche East RN Entered By: Blanche East on 03/05/2022 07:33:17

## 2022-03-07 ENCOUNTER — Encounter (HOSPITAL_BASED_OUTPATIENT_CLINIC_OR_DEPARTMENT_OTHER): Payer: 59 | Attending: General Surgery | Admitting: General Surgery

## 2022-03-07 ENCOUNTER — Encounter (HOSPITAL_COMMUNITY): Payer: 59

## 2022-03-07 DIAGNOSIS — L97822 Non-pressure chronic ulcer of other part of left lower leg with fat layer exposed: Secondary | ICD-10-CM | POA: Insufficient documentation

## 2022-03-07 DIAGNOSIS — R600 Localized edema: Secondary | ICD-10-CM | POA: Diagnosis not present

## 2022-03-07 DIAGNOSIS — L97811 Non-pressure chronic ulcer of other part of right lower leg limited to breakdown of skin: Secondary | ICD-10-CM | POA: Diagnosis not present

## 2022-03-07 DIAGNOSIS — L97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 other part of left lower leg limited to breakdown of skin: Secondary | ICD-10-CM | POA: Insufficient documentation

## 2022-03-07 DIAGNOSIS — I420 Dilated cardiomyopathy: Secondary | ICD-10-CM | POA: Insufficient documentation

## 2022-03-07 DIAGNOSIS — I110 Hypertensive heart disease with heart failure: Secondary | ICD-10-CM | POA: Insufficient documentation

## 2022-03-07 DIAGNOSIS — I5022 Chronic systolic (congestive) heart failure: Secondary | ICD-10-CM | POA: Insufficient documentation

## 2022-03-07 DIAGNOSIS — Z6839 Body mass index (BMI) 39.0-39.9, adult: Secondary | ICD-10-CM | POA: Diagnosis not present

## 2022-03-11 ENCOUNTER — Encounter: Payer: 59 | Admitting: Vascular Surgery

## 2022-03-11 ENCOUNTER — Encounter (HOSPITAL_BASED_OUTPATIENT_CLINIC_OR_DEPARTMENT_OTHER): Payer: 59 | Admitting: General Surgery

## 2022-03-11 DIAGNOSIS — L97822 Non-pressure chronic ulcer of other part of left lower leg with fat layer exposed: Secondary | ICD-10-CM | POA: Diagnosis not present

## 2022-03-11 NOTE — Progress Notes (Signed)
[name], [name] ([id number]) Visit Report for 03/11/2022 Arrival Information Details Patient Name: Date of Service: Clinton Liner RD O. 03/11/2022 11:30 A M Medical Record Number: [id number] Patient Account Number: [id number] Date of Birth/Sex: Treating RN: [birth year] (66 y.o. [name] Primary Care Mical Kicklighter: [name] Other Clinician: Referring Azhia Siefken: Treating Danyal Adorno/Extender: [name] in Treatment: 2 Visit Information History Since Last Visit Added or deleted any medications: No Patient Arrived: Ambulatory Any new allergies or adverse reactions: No Arrival Time: 11:23 Had a fall or experienced change in No Accompanied By: self activities of daily living that may affect Transfer Assistance: None risk of falls: Patient Identification Verified: Yes Signs or symptoms of abuse/neglect since last visito No Secondary Verification Process Completed: Yes Hospitalized since last visit: No Patient Requires Transmission-Based Precautions: No Implantable device outside of the clinic excluding No Patient Has Alerts: No cellular tissue based products placed in the center since last visit: Has Dressing in Place as Prescribed: Yes Has Compression in Place as Prescribed: Yes Pain Present Now: Yes Electronic Signature(s) Signed: 03/11/2022 5:49:01 PM By: Adline Peals Entered By: Adline Peals on 03/11/2022 11:25:40 -------------------------------------------------------------------------------- Compression Therapy Details Patient Name: Date of Service: Clinton Liner RD O. 03/11/2022 11:30 A M Medical Record Number: [id number] Patient Account Number: [id number] Date of Birth/Sex: Treating RN: 1956-06-09 (66 y.o. [name] Primary Care Lazara Grieser: [name] Other Clinician: Referring Aloura Matsuoka: Treating Freddi Forster/Extender: [name] in Treatment: 2 Compression Therapy Performed for Wound  Assessment: Wound #1 Anterior Lower Leg Performed By: Clinician Adline Peals, RN Compression Type: Three Layer Electronic Signature(s) Signed: 03/11/2022 5:49:01 PM By: Adline Peals Entered By: Adline Peals on 03/11/2022 11:26:21 -------------------------------------------------------------------------------- Encounter Discharge Information Details Patient Name: Date of Service: Clinton Liner RD O. 03/11/2022 11:30 A M Medical Record Number: [id number] Patient Account Number: [id number] Date of Birth/Sex: Treating RN: 06-21-1956 (66 y.o. [name] Primary Care Charlett Merkle: [name] Other Clinician: Referring Rayen Palen: Treating Terron Merfeld/Extender: [name] in Treatment: 2 Encounter Discharge Information Items Discharge Condition: Stable Ambulatory Status: Ambulatory Discharge Destination: Home Transportation: Private Auto Accompanied By: self Schedule Follow-up Appointment: Yes Clinical Summary of Care: Patient Declined Electronic Signature(s) Signed: 03/11/2022 5:49:01 PM By: [name] By: Adline Peals on 03/11/2022 11:48:48 -------------------------------------------------------------------------------- Patient/Caregiver Education Details Patient Name: Date of Service: [name], Clinton RD O. 9/5/2023andnbsp11:30 A M Medical Record Number: [id number] Patient Account Number: [id number] Date of Birth/Gender: Treating RN: 1956/05/12 (66 y.o. [name] Primary Care Physician: [name] Other Clinician: Referring Physician: Treating Physician/Extender: [name] in Treatment: 2 Education Assessment Education Provided To: Patient Education Topics Provided Wound/Skin Impairment: Methods: Explain/Verbal Responses: Reinforcements needed, State content correctly Electronic Signature(s) Signed: 03/11/2022 5:49:01 PM By: Adline Peals Entered By:  Adline Peals on 03/11/2022 11:48:40 -------------------------------------------------------------------------------- Wound Assessment Details Patient Name: Date of Service: Clinton Liner RD O. 03/11/2022 11:30 A M Medical Record Number: [id number] Patient Account Number: [id number] Date of Birth/Sex: Treating RN: [birth year] (66 y.o. [name] Primary Care Julyan Gales: [name] Other Clinician: Referring Tabby Beaston: Treating Oddie Kuhlmann/Extender: [name] in Treatment: 2 Wound Status Wound Number: 1 Primary Venous Leg Ulcer Etiology: Wound Location: Anterior Lower Leg Wound Open Wounding Event: Blister Wounding Event: Blister Status: Date Acquired: 09/25/2021 Comorbid Cataracts, Lymphedema, Sleep Apnea, Congestive Heart Failure, Weeks Of Treatment: 2 History: Coronary Artery Disease, Hypertension, Confinement Anxiety Clustered Wound: Yes Wound Measurements Length: (cm) 10 Width: (  cm) 8.5 Depth: (cm) 0.1 Area: (cm) 66.759 Volume: (cm) 6.676 % Reduction in Area: -1228% % Reduction in Volume: -1227.2% Epithelialization: Large (67-100%) Wound Description Classification: Full Thickness Without Exposed Support Structures Wound Margin: Flat and Intact Exudate Amount: Large Exudate Type: Serous Exudate Color: amber Foul Odor After Cleansing: No Slough/Fibrino Yes Wound Bed Granulation Amount: Large (67-100%) Exposed Structure Granulation Quality: Red, Pink Fascia Exposed: No Necrotic Amount: Small (1-33%) Fat Layer (Subcutaneous Tissue) Exposed: Yes Necrotic Quality: Adherent Slough Tendon Exposed: No Muscle Exposed: No Joint Exposed: No Bone Exposed: No Treatment Notes Wound #1 (Lower Leg) Wound Laterality: Anterior Cleanser Soap and Water Discharge Instruction: May shower and wash wound with dial antibacterial soap and water prior to dressing change. Peri-Wound Care Sween Lotion (Moisturizing lotion) Discharge  Instruction: Apply moisturizing lotion as directed Topical Mupirocin Ointment Discharge Instruction: Apply Mupirocin (Bactroban) as instructed Primary Dressing KerraCel Ag Gelling Fiber Dressing, 4x5 in (silver alginate) Discharge Instruction: Apply silver alginate to wound bed as instructed Secondary Dressing Zetuvit Plus Silicone Border Dressing 4x4 (in/in) Discharge Instruction: Apply silicone border over primary dressing as directed. Secured With [employer] Surgical T 2x10 (in/yd) ape Discharge Instruction: Secure with tape as directed. Compression Wrap ThreePress (3 layer compression wrap) Discharge Instruction: Apply three layer compression as directed. Compression Stockings Add-Ons Electronic Signature(s) Signed: 03/11/2022 5:49:01 PM By: Adline Peals Entered By: Adline Peals on 03/11/2022 11:26:11 -------------------------------------------------------------------------------- Vitals Details Patient Name: Date of Service: Clinton Liner RD O. 03/11/2022 11:30 A M Medical Record Number: [id number] Patient Account Number: [id number] Date of Birth/Sex: Treating RN: 04-Mar-1956 (66 y.o. [name] Primary Care Rosevelt Luu: [name] Other Clinician: Referring Anaih Brander: Treating Francile Woolford/Extender: [name] in Treatment: 2 Vital Signs Time Taken: 11:25 Temperature (F): 97.6 Height (in): 76 Pulse (bpm): 73 Weight (lbs): 322 Respiratory Rate (breaths/min): 18 Body Mass Index (BMI): 39.2 Blood Pressure (mmHg): 145/93 Reference Range: 80 - 120 mg / dl Electronic Signature(s) Signed: 03/11/2022 5:49:01 PM By: Adline Peals Entered By: Adline Peals on 03/11/2022 11:26:01

## 2022-03-11 NOTE — Progress Notes (Signed)
LYDEN, REDNER ([id number]) Visit Report for 03/07/2022 Arrival Information Details Patient Name: Date of Service: [name] RD O. 03/07/2022 7:30 A M Medical Record Number: [id number] Patient Account Number: [id number] Date of Birth/Sex: Treating RN: 1956-05-03 (66 y.o. [name] Primary Care Berit Raczkowski: [name] Other Clinician: Referring Austen Wygant: Treating Skylan Lara/Extender: [name] in Treatment: 1 Visit Information History Since Last Visit All ordered tests and consults were completed: Yes Patient Arrived: Ambulatory Added or deleted any medications: No Arrival Time: 07:39 Any new allergies or adverse reactions: No Accompanied By: self Had a fall or experienced change in No Transfer Assistance: None activities of daily living that may affect Patient Requires Transmission-Based Precautions: No risk of falls: Patient Has Alerts: No Signs or symptoms of abuse/neglect since last visito No Hospitalized since last visit: No Implantable device outside of the clinic excluding No cellular tissue based products placed in the center since last visit: Has Dressing in Place as Prescribed: Yes Has Compression in Place as Prescribed: Yes Pain Present Now: Yes Electronic Signature(s) Signed: 03/11/2022 12:19:15 PM By: Blanche East RN Entered By: Blanche East on 03/07/2022 07:40:09 -------------------------------------------------------------------------------- Compression Therapy Details Patient Name: Date of Service: [name] RD O. 03/07/2022 7:30 A M Medical Record Number: [id number] Patient Account Number: [id number] Date of Birth/Sex: Treating RN: [birth year] (66 y.o. [name] Primary Care Hisashi Amadon: [name] Other Clinician: Referring Yonis Carreon: Treating Rafiq Bucklin/Extender: [name] in Treatment: 1 Compression Therapy Performed for Wound Assessment: Wound #1 Anterior Lower Leg Performed  By: Clinician Blanche East, RN Compression Type: Three Layer Electronic Signature(s) Signed: 03/11/2022 12:19:15 PM By: Blanche East RN Entered By: Blanche East on 03/07/2022 07:41:31 -------------------------------------------------------------------------------- Encounter Discharge Information Details Patient Name: Date of Service: [name] RD O. 03/07/2022 7:30 A M Medical Record Number: [id number] Patient Account Number: [id number] Date of Birth/Sex: Treating RN: 05-18-1956 (66 y.o. [name] Primary Care Elihu Milstein: [name] Other Clinician: Referring Delisha Peaden: Treating Kaleeah Gingerich/Extender: [name] in Treatment: 1 Encounter Discharge Information Items Discharge Condition: Stable Ambulatory Status: Ambulatory Discharge Destination: Home Transportation: Private Auto Accompanied By: self Schedule Follow-up Appointment: Yes Clinical Summary of Care: Electronic Signature(s) Signed: 03/11/2022 12:19:15 PM By: Blanche East RN Entered By: Blanche East on 03/07/2022 08:11:31 -------------------------------------------------------------------------------- Patient/Caregiver Education Details Patient Name: Date of Service: [name] RD Jenetta Downer 9/1/2023andnbsp7:30 [place] Record Number: [id number] Patient Account Number: [id number] Date of Birth/Gender: Treating RN: 1955/11/12 (66 y.o. [name] Primary Care Physician: [name] Other Clinician: Referring Physician: Treating Physician/Extender: [name] in Treatment: 1 Education Assessment Education Provided To: Patient Education Topics Provided Welcome T [facility] [place]: o Methods: Explain/Verbal Responses: Reinforcements needed, State content correctly Electronic Signature(s) Signed: 03/11/2022 12:19:15 PM By: Blanche East RN Entered By: Blanche East on 03/07/2022  08:11:26 -------------------------------------------------------------------------------- Wound Assessment Details Patient Name: Date of Service: [name] RD O. 03/07/2022 7:30 A M Medical Record Number: [id number] Patient Account Number: [id number] Date of Birth/Sex: Treating RN: 1956-05-25 (66 y.o. [name] Primary Care Cedric Mcclaine: [name] Other Clinician: Referring Sunny Gains: Treating Aydian Dimmick/Extender: [name] in Treatment: 1 Wound Status Wound Number: 1 Primary Venous Leg Ulcer Etiology: Wound Location: Anterior Lower Leg Wound Open Wounding Event: Blister Status: Date Acquired: 09/25/2021 Comorbid Cataracts, Lymphedema, Sleep Apnea, Congestive Heart Failure, Weeks Of Treatment: 1 History: Coronary Artery Disease, Hypertension, Confinement Anxiety Clustered Wound: Yes Wound Measurements Length: (  cm) 10 Width: (cm) 8.5 Depth: (cm) 0.1 Area: (cm) 66.759 Volume: (cm) 6.676 % Reduction in Area: -1228% % Reduction in Volume: -1227.2% Epithelialization: Large (67-100%) Tunneling: No Undermining: No Wound Description Classification: Full Thickness Without Exposed Support Structures Wound Margin: Flat and Intact Exudate Amount: Large Exudate Type: Serous Exudate Color: amber Foul Odor After Cleansing: No Slough/Fibrino Yes Wound Bed Granulation Amount: Large (67-100%) Exposed Structure Granulation Quality: Red, Pink Fascia Exposed: No Necrotic Amount: Small (1-33%) Fat Layer (Subcutaneous Tissue) Exposed: Yes Necrotic Quality: Adherent Slough Tendon Exposed: No Muscle Exposed: No Joint Exposed: No Bone Exposed: No Electronic Signature(s) Signed: 03/11/2022 12:19:15 PM By: Blanche East RN Entered By: Blanche East on 03/07/2022 07:41:13 -------------------------------------------------------------------------------- Vitals Details Patient Name: Date of Service: [name] RD O. 03/07/2022 7:30 A M Medical  Record Number: [id number] Patient Account Number: [id number] Date of Birth/Sex: Treating RN: 10/26/1955 (66 y.o. [name] Primary Care Sugar Vanzandt: [name] Other Clinician: Referring Tamera Pingley: Treating Jotham Ahn/Extender: [name] in Treatment: 1 Vital Signs Time Taken: 07:40 Temperature (F): 97.9 Height (in): 76 Pulse (bpm): 77 Weight (lbs): 322 Respiratory Rate (breaths/min): 18 Body Mass Index (BMI): 39.2 Blood Pressure (mmHg): 120/68 Reference Range: 80 - 120 mg / dl Electronic Signature(s) Signed: 03/11/2022 12:19:15 PM By: Blanche East RN Entered By: Blanche East on 03/07/2022 07:40:42

## 2022-03-11 NOTE — Progress Notes (Signed)
DANARIUS, MCCONATHY ([id number]) Visit Report for 03/07/2022 SuperBill Details Patient Name: Date of Service: [facility] RD O. 03/07/2022 Medical Record Number: [id number] Patient Account Number: [id number] Date of Birth/Sex: Treating RN: 23-Jun-1956 (66 y.o. [name] Primary Care Provider: [name] Other Clinician: Referring Provider: Treating Provider/Extender: [name] in Treatment: 1 Diagnosis Coding ICD-10 Codes Code Description [phone] Non-pressure chronic ulcer of other part of left lower leg with fat layer exposed R60.0 Localized edema E16.24 Chronic systolic (congestive) heart failure I42.0 Dilated cardiomyopathy E66.01 Morbid (severe) obesity due to excess calories I10 Essential (primary) hypertension Facility Procedures CPT4 Code Description Modifier Quantity [id number] (Facility Use Only) [phone] - [place] ZFPOIP LWR LT LEG 1 ICD-10 Diagnosis Description 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d Electronic Signature(s) Signed: 03/07/2022 9:28:33 AM By: Fredirick Maudlin MD FACS Signed: 03/11/2022 12:19:15 PM By: Blanche East RN Entered By: Blanche East on 03/07/2022 08:12:04

## 2022-03-11 NOTE — Progress Notes (Signed)
NOSSON, WENDER ([id number]) Visit Report for 03/11/2022 SuperBill Details Patient Name: Date of Service: [facility] RD O. 03/11/2022 Medical Record Number: [id number] Patient Account Number: [id number] Date of Birth/Sex: Treating RN: 1956/07/01 (66 y.o. [name] Primary Care Provider: [name] Other Clinician: Referring Provider: Treating Provider/Extender: [name] in Treatment: 2 Diagnosis Coding ICD-10 Codes Code Description [phone] Non-pressure chronic ulcer of other part of left lower leg with fat layer exposed R60.0 Localized edema T05.69 Chronic systolic (congestive) heart failure I42.0 Dilated cardiomyopathy E66.01 Morbid (severe) obesity due to excess calories I10 Essential (primary) hypertension Facility Procedures CPT4 Code Description Modifier Quantity [phone] (Facility Use Only) [phone] - APPLY MULTLAY COMPRS LWR RT LEG 1 ICD-10 Diagnosis Description 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d Electronic Signature(s) Signed: 03/11/2022 12:22:50 PM By: Fredirick Maudlin MD FACS Signed: 03/11/2022 5:49:01 PM By: Adline Peals Entered By: Adline Peals on 03/11/2022 11:49:03

## 2022-03-14 ENCOUNTER — Encounter (HOSPITAL_BASED_OUTPATIENT_CLINIC_OR_DEPARTMENT_OTHER): Payer: 59 | Admitting: General Surgery

## 2022-03-14 DIAGNOSIS — L97822 Non-pressure chronic ulcer of other part of left lower leg with fat layer exposed: Secondary | ICD-10-CM | POA: Diagnosis not present

## 2022-03-14 NOTE — Progress Notes (Signed)
[name] ([id number]) Visit Report for 03/14/2022 Arrival Information Details Patient Name: Date of Service: [name] RD O. 03/14/2022 8:00 A M Medical Record Number: [id number] Patient Account Number: [id number] Date of Birth/Sex: Treating RN: 1955-10-20 (66 y.o. [name] Primary Care [name]: [name] Other Clinician: Referring [name]: Treating [name]/Extender: [name]: 2 Visit Information History Since Last Visit Added or deleted any medications: No Patient Arrived: Ambulatory Any new allergies or adverse reactions: No Arrival Time: 08:17 Had a fall or experienced change in No Accompanied By: self activities of daily living that may affect Transfer Assistance: None risk of falls: Patient Identification Verified: Yes Signs or symptoms of abuse/neglect since last visito No Patient Requires Transmission-Based Precautions: No Hospitalized since last visit: No Patient Has Alerts: No Implantable device outside of the clinic excluding No cellular tissue based products placed in the center since last visit: Has Dressing in Place as Prescribed: Yes Has Compression in Place as Prescribed: Yes Pain Present Now: No Electronic Signature(s) Signed: 03/14/2022 6:17:39 PM By: Clinton Catholic RN Entered By: [name] on 03/14/2022 08:18:20 -------------------------------------------------------------------------------- Compression Therapy Details Patient Name: Date of Service: [name] RD O. 03/14/2022 8:00 A M Medical Record Number: [id number] Patient Account Number: [id number] Date of Birth/Sex: Treating RN: 1955-07-18 (66 y.o. [name] Primary Care Graziella Connery: [name] Other Clinician: Referring Yacob Wilkerson: Treating [name]/Extender: [name]: 2 Compression Therapy Performed for Wound Assessment: Wound #1 Anterior Lower Leg Performed By:  Clinician Clinton Catholic, RN Compression Type: Three Layer Post Procedure Diagnosis Same as Pre-procedure Electronic Signature(s) Signed: 03/14/2022 6:17:39 PM By: Clinton Catholic RN Entered By: [name] on 03/14/2022 08:34:54 -------------------------------------------------------------------------------- Encounter Discharge Information Details Patient Name: Date of Service: [name] RD O. 03/14/2022 8:00 A M Medical Record Number: [id number] Patient Account Number: [id number] Date of Birth/Sex: Treating RN: 12-08-1955 (65 y.o. [name] Primary Care Rebecka Oelkers: [name] Other Clinician: Referring [name]: Treating [name]/Extender: [name]: 2 Encounter Discharge Information Items Post Procedure Vitals Discharge Condition: Stable Temperature (F): 98.3 Ambulatory Status: Ambulatory Pulse (bpm): 80 Discharge Destination: Home Respiratory Rate (breaths/min): 16 Transportation: Private Auto Blood Pressure (mmHg): 128/86 Accompanied By: self Schedule Follow-up Appointment: Yes Clinical Summary of Care: Electronic Signature(s) Signed: 03/14/2022 5:05:59 PM By: Clinton East RN Entered By: [name] on 03/14/2022 12:29:19 -------------------------------------------------------------------------------- Lower Extremity Assessment Details Patient Name: Date of Service: [name] RD O. 03/14/2022 8:00 A M Medical Record Number: [id number] Patient Account Number: [id number] Date of Birth/Sex: Treating RN: 03-04-1956 (66 y.o. [name] Primary Care Anjeli Casad: [name] Other Clinician: Referring [name]: Treating [name]/Extender: [name]: 2 Edema Assessment Assessed: [Left: No] [Right: No] E[Left: dema] [Right: :] Calf Left: Right: Point of Measurement: 38 cm From Medial Instep 49 cm Ankle Left: Right: Point of Measurement: 11 cm From Medial  Instep 28.7 cm Knee To Floor Left: Right: From Medial Instep 44 cm Electronic Signature(s) Signed: 03/14/2022 6:17:39 PM By: Clinton Catholic RN Entered By: [name] on 03/14/2022 08:23:29 -------------------------------------------------------------------------------- Multi Wound Chart Details Patient Name: Date of Service: [name] RD O. 03/14/2022 8:00 A M Medical Record Number: [id number] Patient Account Number: [id number] Date of Birth/Sex: Treating RN: [birth year] (66 y.o. M) Primary Care [name]: [name] Other Clinician: Referring [name]: Treating [name]/Extender: [name]: 2 Vital Signs Height(in): 76 Pulse(bpm): 80  Weight(lbs): 322 Blood Pressure(mmHg): 126/86 Body Mass Index(BMI): 39.2 Temperature(F): 98.3 Respiratory Rate(breaths/min): 16 Photos: [N/A:N/A] Anterior Lower Leg N/A N/A Wound Location: Blister N/A N/A Wounding Event: Venous Leg Ulcer N/A N/A Primary Etiology: Cataracts, Lymphedema, Sleep N/A N/A Comorbid History: Apnea, Congestive Heart Failure, Coronary Artery Disease, Hypertension, Confinement Anxiety 09/25/2021 N/A N/A Date Acquired: 2 N/A N/A Weeks of [name]: Open N/A N/A Wound Status: No N/A N/A Wound Recurrence: Yes N/A N/A Clustered Wound: 1x1.2x0.1 N/A N/A Measurements L x W x D (cm) 0.942 N/A N/A A (cm) : rea 0.094 N/A N/A Volume (cm) : 81.30% N/A N/A % Reduction in A rea: 81.30% N/A N/A % Reduction in Volume: Full Thickness Without Exposed N/A N/A Classification: Support Structures Large N/A N/A Exudate A mount: Serous N/A N/A Exudate Type: amber N/A N/A Exudate Color: Flat and Intact N/A N/A Wound Margin: Medium (34-66%) N/A N/A Granulation A mount: Red, Pink N/A N/A Granulation Quality: Medium (34-66%) N/A N/A Necrotic A mount: Fat Layer (Subcutaneous Tissue): Yes N/A N/A Exposed Structures: Fascia: No Tendon: No Muscle: No Joint:  No Bone: No Large (67-100%) N/A N/A Epithelialization: Debridement - Selective/Open Wound N/A N/A Debridement: Pre-procedure Verification/Time Out 08:31 N/A N/A Taken: Lidocaine 5% topical ointment N/A N/A Pain Control: Necrotic/Eschar, Slough N/A N/A Tissue Debrided: Non-Viable Tissue N/A N/A Level: 1.2 N/A N/A Debridement A (sq cm): rea Curette N/A N/A Instrument: Minimum N/A N/A Bleeding: Pressure N/A N/A Hemostasis A chieved: 0 N/A N/A Procedural Pain: 0 N/A N/A Post Procedural Pain: Procedure was tolerated well N/A N/A Debridement [name] Response: 1x1.2x0.1 N/A N/A Post Debridement Measurements L x W x D (cm) 0.094 N/A N/A Post Debridement Volume: (cm) Compression Therapy N/A N/A Procedures Performed: Debridement [name] Notes Electronic Signature(s) Signed: 03/14/2022 8:41:08 AM By: Fredirick Maudlin MD FACS Entered By: Fredirick Maudlin on 03/14/2022 08:41:07 -------------------------------------------------------------------------------- Multi-Disciplinary Care Plan Details Patient Name: Date of Service: [name] RD O. 03/14/2022 8:00 A M Medical Record Number: [id number] Patient Account Number: [id number] Date of Birth/Sex: Treating RN: July 25, 1955 (66 y.o. [name] Primary Care Daphane Odekirk: [name] Other Clinician: Referring Arne Schlender: Treating Kit Mollett/Extender: [name]: 2 Active Inactive Venous Leg Ulcer Nursing Diagnoses: Potential for venous Insuffiency (use before diagnosis confirmed) Goals: Patient will maintain optimal edema control Date Initiated: 02/24/2022 Target Resolution Date: 03/31/2022 Goal Status: Active Interventions: Assess peripheral edema status every visit. Compression as ordered Notes: Wound/Skin Impairment Nursing Diagnoses: Impaired tissue integrity Goals: Ulcer/skin breakdown will have a volume reduction of 30% by week 4 Date Initiated:  02/24/2022 Target Resolution Date: 03/31/2022 Goal Status: Active Interventions: Assess patient/caregiver ability to perform ulcer/skin care regimen upon admission and as needed [name] Activities: Topical wound management initiated : 02/24/2022 Notes: Electronic Signature(s) Signed: 03/14/2022 6:17:39 PM By: Clinton Catholic RN Entered By: [name] on 03/14/2022 08:27:08 -------------------------------------------------------------------------------- Pain Assessment Details Patient Name: Date of Service: [name] RD O. 03/14/2022 8:00 A M Medical Record Number: [id number] Patient Account Number: [id number] Date of Birth/Sex: Treating RN: 10-Nov-1955 (66 y.o. [name] Primary Care Nature Kueker: [name] Other Clinician: Referring Leon Goodnow: Treating Azriel Dancy/Extender: [name]: 2 Active Problems Location of Pain Severity and Description of Pain Patient Has Paino Yes Site Locations Pain Location: Generalized Pain With Dressing Change: Yes Duration of the Pain. Constant / Intermittento Constant Rate the pain. Current Pain Level: 8 Worst Pain Level: 10 Least Pain Level: 5 Tolerable Pain Level: 5 Character of Pain Describe the Pain:  Difficult to Pinpoint Pain Management and Medication Current Pain Management: Medication: Yes Cold Application: No Rest: Yes Massage: No Activity: No T.E.N.S.: No Heat Application: No Leg drop or elevation: No Is the Current Pain Management Adequate: Adequate How does your wound impact your activities of daily livingo Sleep: No Bathing: No Appetite: No Relationship With Others: No Bladder Continence: No Emotions: No Bowel Continence: No Work: No Toileting: No Drive: No Dressing: No Hobbies: No Electronic Signature(s) Signed: 03/14/2022 6:17:39 PM By: Clinton Catholic RN Entered By: [name] on 03/14/2022  08:20:32 -------------------------------------------------------------------------------- Patient/Caregiver Education Details Patient Name: Date of Service: [name] RD O. 9/8/2023andnbsp8:00 A M Medical Record Number: [id number] Patient Account Number: [id number] Date of Birth/Gender: Treating RN: 05-02-1956 (66 y.o. [name] Primary Care Physician: [name] Other Clinician: Referring Physician: Treating Physician/Extender: [name]: 2 Education Assessment Education Provided To: Patient Education Topics Provided Wound/Skin Impairment: Methods: Explain/Verbal Responses: Reinforcements needed, State content correctly Electronic Signature(s) Signed: 03/14/2022 6:17:39 PM By: Clinton Catholic RN Entered By: [name] on 03/14/2022 08:27:22 -------------------------------------------------------------------------------- Wound Assessment Details Patient Name: Date of Service: [name] RD O. 03/14/2022 8:00 A M Medical Record Number: [id number] Patient Account Number: [id number] Date of Birth/Sex: Treating RN: [birth year] (66 y.o. [name] Primary Care Jennavie Martinek: [name] Other Clinician: Referring Makenah Karas: Treating Liseth Wann/Extender: [name]: 2 Wound Status Wound Number: 1 Primary Venous Leg Ulcer Etiology: Wound Location: Anterior Lower Leg Wound Open Wounding Event: Blister Status: Date Acquired: 09/25/2021 Comorbid Cataracts, Lymphedema, Sleep Apnea, Congestive Heart Failure, Weeks Of [name]: 2 History: Coronary Artery Disease, Hypertension, Confinement Anxiety Clustered Wound: Yes Photos Wound Measurements Length: (cm) 1 Width: (cm) 1.2 Depth: (cm) 0.1 Area: (cm) 0.942 Volume: (cm) 0.094 % Reduction in Area: 81.3% % Reduction in Volume: 81.3% Epithelialization: Large (67-100%) Tunneling: No Undermining: No Wound  Description Classification: Full Thickness Without Exposed Support Structu Wound Margin: Flat and Intact Exudate Amount: Large Exudate Type: Serous Exudate Color: amber Wound Bed Granulation Amount: Medium (34-66%) Granulation Quality: Red, Pink Necrotic Amount: Medium (34-66%) Necrotic Quality: Adherent Slough res [employer] Odor After Cleansing: No Slough/Fibrino Yes Exposed Structure Fascia Exposed: No Fat Layer (Subcutaneous Tissue) Exposed: Yes Tendon Exposed: No Muscle Exposed: No Joint Exposed: No Bone Exposed: No [name] Notes Wound #1 (Lower Leg) Wound Laterality: Anterior Cleanser Soap and Water Discharge Instruction: May shower and wash wound with dial antibacterial soap and water prior to dressing change. Peri-Wound Care Sween Lotion (Moisturizing lotion) Discharge Instruction: Apply moisturizing lotion as directed Topical Mupirocin Ointment Discharge Instruction: Apply Mupirocin (Bactroban) as instructed Primary Dressing KerraCel Ag Gelling Fiber Dressing, 4x5 in (silver alginate) Discharge Instruction: Apply silver alginate to wound bed as instructed Secondary Dressing Zetuvit Plus Silicone Border Dressing 4x4 (in/in) Discharge Instruction: Apply silicone border over primary dressing as directed. Secured With [employer] Surgical T 2x10 (in/yd) ape Discharge Instruction: Secure with tape as directed. Compression Wrap ThreePress (3 layer compression wrap) Discharge Instruction: Apply three layer compression as directed. Compression Stockings Add-Ons Electronic Signature(s) Signed: 03/14/2022 5:05:59 PM By: Clinton East RN Signed: 03/14/2022 6:17:39 PM By: Clinton Catholic RN Entered By: [name] on 03/14/2022 08:26:08 -------------------------------------------------------------------------------- Vitals Details Patient Name: Date of Service: [name] RD O. 03/14/2022 8:00 A M Medical Record Number: [id number] Patient Account Number:  [id number] Date of Birth/Sex: Treating RN: 1956/05/13 (66 y.o. [name] Primary Care Jonthan Leite: [name] Other Clinician: Referring Nicky Kras: Treating Davison Ohms/Extender:  Janeal Holmes Weeks in [name]: 2 Vital Signs Time Taken: 08:12 Temperature (F): 98.3 Height (in): 76 Pulse (bpm): 80 Weight (lbs): 322 Respiratory Rate (breaths/min): 16 Body Mass Index (BMI): 39.2 Blood Pressure (mmHg): 126/86 Reference Range: 80 - 120 mg / dl Electronic Signature(s) Signed: 03/14/2022 6:17:39 PM By: Clinton Catholic RN Entered By: [name] on 03/14/2022 08:18:47

## 2022-03-14 NOTE — Progress Notes (Signed)
[name], [name] ([id number]) Visit Report for 03/14/2022 Chief Complaint Document Details Patient Name: Date of Service: Clinton Liner RD O. 03/14/2022 8:00 A M Medical Record Number: [id number] Patient Account Number: [id number] Date of Birth/Sex: Treating RN: [birth year] (66 y.o. M) Primary Care Provider: [name] Other Clinician: Referring Provider: Treating Provider/Extender: [name] in Treatment: 2 Information Obtained from: Patient Chief Complaint Patient presents for treatment of an open ulcer due to venous insufficiency Electronic Signature(s) Signed: 03/14/2022 8:41:17 AM By: Fredirick Maudlin MD FACS Entered By: Fredirick Maudlin on 03/14/2022 08:41:17 -------------------------------------------------------------------------------- Debridement Details Patient Name: Date of Service: Clinton Liner RD O. 03/14/2022 8:00 A M Medical Record Number: [id number] Patient Account Number: [id number] Date of Birth/Sex: Treating RN: September 21, 1955 (66 y.o. [name] Primary Care Provider: [name] Other Clinician: Referring Provider: Treating Provider/Extender: [name] in Treatment: 2 Debridement Performed for Assessment: Wound #1 Anterior Lower Leg Performed By: Physician Fredirick Maudlin, MD Debridement Type: Debridement Severity of Tissue Pre Debridement: Fat layer exposed Level of Consciousness (Pre-procedure): Awake and Alert Pre-procedure Verification/Time Out Yes - 08:31 Taken: Start Time: 08:32 Pain Control: Lidocaine 5% topical ointment T Area Debrided (L x W): otal 1 (cm) x 1.2 (cm) = 1.2 (cm) Tissue and other material debrided: Non-Viable, Eschar, Slough, Slough Level: Non-Viable Tissue Debridement Description: Selective/Open Wound Instrument: Curette Bleeding: Minimum Hemostasis Achieved: Pressure Procedural Pain: 0 Post Procedural Pain: 0 Response to Treatment: Procedure was tolerated  well Level of Consciousness (Post- Awake and Alert procedure): Post Debridement Measurements of Total Wound Length: (cm) 1 Width: (cm) 1.2 Depth: (cm) 0.1 Volume: (cm) 0.094 Character of Wound/Ulcer Post Debridement: Improved Severity of Tissue Post Debridement: Fat layer exposed Post Procedure Diagnosis Same as Pre-procedure Notes Scribing for Dr. Celine Ahr by Blanche East, RN Electronic Signature(s) Signed: 03/14/2022 12:27:52 PM By: Fredirick Maudlin MD FACS Signed: 03/14/2022 5:05:59 PM By: Blanche East RN Previous Signature: 03/14/2022 8:53:39 AM Version By: Fredirick Maudlin MD FACS Entered By: Blanche East on 03/14/2022 12:19:21 -------------------------------------------------------------------------------- HPI Details Patient Name: Date of Service: Clinton Liner RD O. 03/14/2022 8:00 A M Medical Record Number: [id number] Patient Account Number: [id number] Date of Birth/Sex: Treating RN: July 12, 1955 (66 y.o. M) Primary Care Provider: [name] Other Clinician: Referring Provider: Treating Provider/Extender: [name] in Treatment: 2 History of Present Illness HPI Description: ADMISSION 02/24/2022 This is a 66 year old man with a past medical history significant for congestive heart failure, morbid obesity, dilated cardiomyopathy, atrial fibrillation, and lower extremity cellulitis. He is currently being treated with Keflex. He is not diabetic. He does not smoke. He says that he developed some blisters on his left anterior tibial surface which subsequently broke open causing the wounds for which she is here to see [place] today. He says that he occasionally wears compression stockings but does not do so on a regular basis. He reports that "they look stupid with shorts." ABI in clinic today was 0.94. On the left anterior tibial surface, there are several small wounds in a geographic pattern. They do appear consistent with blisters that have ruptured. The  fat layer is exposed and there is a thin layer of slough on the surfaces. He does have some surrounding erythema, but no purulent drainage. Edema control is very poor. 03/03/2022: The patient came in for a nurse visit last week because he thought the compression wraps were too tight. They were reapplied and apparently he did not understand the instruction to try  to stay off his feet is much as possible and keep his leg elevated; he instead walked excessively on both Friday and Saturday. As result his leg became more swollen, the wrap became uncomfortable and he cut it off yesterday. He replaced it with a wrap of his own fashion. Today he has an indentation in his leg where his homemade wrap ended. He has extensive 3+ nonpitting edema above this. The wounds themselves are in fairly decent condition with just a little bit of slough on the surface, but edema fluid is frankly pouring out of the largest wound. 03/14/2022: Significant improvement in his wound. He has come in a couple of times for nurse visits due to drainage. Today the wound is quite a bit smaller and just has a layer of eschar and slough on the surface. Edema control is better. Electronic Signature(s) Signed: 03/14/2022 8:43:04 AM By: Fredirick Maudlin MD FACS Entered By: Fredirick Maudlin on 03/14/2022 08:43:04 -------------------------------------------------------------------------------- Physical Exam Details Patient Name: Date of Service: Clinton Liner RD O. 03/14/2022 8:00 A M Medical Record Number: [id number] Patient Account Number: [id number] Date of Birth/Sex: Treating RN: 10-21-1955 (66 y.o. M) Primary Care Provider: [name] Other Clinician: Referring Provider: Treating Provider/Extender: [name] in Treatment: 2 Constitutional . . . . No acute distress.Marland Kitchen Respiratory Normal work of breathing on room air.. Notes 03/14/2022: Significant improvement in his wound. He has come in a couple of  times for nurse visits due to drainage. Today the wound is quite a bit smaller and just has a layer of eschar and slough on the surface. Edema control is better. Electronic Signature(s) Signed: 03/14/2022 8:43:36 AM By: Fredirick Maudlin MD FACS Entered By: Fredirick Maudlin on 03/14/2022 08:43:36 -------------------------------------------------------------------------------- Physician Orders Details Patient Name: Date of Service: Clinton Liner RD O. 03/14/2022 8:00 A M Medical Record Number: [id number] Patient Account Number: [id number] Date of Birth/Sex: Treating RN: Mar 23, 1956 (66 y.o. [name] Primary Care Provider: [name] Other Clinician: Referring Provider: Treating Provider/Extender: [name] in Treatment: 2 Verbal / Phone Orders: No Diagnosis Coding Follow-up Appointments ppointment in 1 week. - Dr. Celine Ahr Rm 4 Return A Friday 03/21/22 at 08:45 AM Nurse Visit: - Room 4 Monday 03/17/22 at 7:30AM Wednesday 03/19/22 at 7:30 AM Anesthetic Wound #1 Anterior Lower Leg (In clinic) Topical Lidocaine 5% applied to wound bed - in clinic, prior to debridement Bathing/ Shower/ Hygiene May shower with protection but do not get wound dressing(s) wet. - use cast protector on Left lower leg Edema Control - Lymphedema / SCD / Other Avoid standing for long periods of time. Patient to wear own compression stockings every day. - wear compression stockings Right leg Wound Treatment Wound #1 - Lower Leg Wound Laterality: Anterior Cleanser: Soap and Water Discharge Instructions: May shower and wash wound with dial antibacterial soap and water prior to dressing change. Peri-Wound Care: Sween Lotion (Moisturizing lotion) Discharge Instructions: Apply moisturizing lotion as directed Topical: Mupirocin Ointment Discharge Instructions: Apply Mupirocin (Bactroban) as instructed Prim Dressing: KerraCel Ag Gelling Fiber Dressing, 4x5 in (silver  alginate) ary Discharge Instructions: Apply silver alginate to wound bed as instructed Secondary Dressing: Zetuvit Plus Silicone Border Dressing 4x4 (in/in) Discharge Instructions: Apply silicone border over primary dressing as directed. Secured With: 35M Medipore Public affairs consultant Surgical T 2x10 (in/yd) ape Discharge Instructions: Secure with tape as directed. Compression Wrap: ThreePress (3 layer compression wrap) Discharge Instructions: Apply three layer compression as directed. Electronic Signature(s) Signed: 03/14/2022 12:27:52 PM  By: Fredirick Maudlin MD FACS Signed: 03/14/2022 5:05:59 PM By: Blanche East RN Previous Signature: 03/14/2022 8:53:39 AM Version By: Fredirick Maudlin MD FACS Entered By: Blanche East on 03/14/2022 12:19:46 -------------------------------------------------------------------------------- Problem List Details Patient Name: Date of Service: Clinton Liner RD O. 03/14/2022 8:00 A M Medical Record Number: [id number] Patient Account Number: [id number] Date of Birth/Sex: Treating RN: 1955/07/25 (66 y.o. M) Primary Care Provider: [name] Other Clinician: Referring Provider: Treating Provider/Extender: [name] in Treatment: 2 Active Problems ICD-10 Encounter Code Description Active Date MDM Diagnosis [phone] Non-pressure chronic ulcer of other part of left lower leg with fat layer exposed 02/24/2022 No Yes R60.0 Localized edema 02/24/2022 No Yes H60.73 Chronic systolic (congestive) heart failure 02/24/2022 No Yes I42.0 Dilated cardiomyopathy 02/24/2022 No Yes E66.01 Morbid (severe) obesity due to excess calories 02/24/2022 No Yes I10 Essential (primary) hypertension 02/24/2022 No Yes Inactive Problems Resolved Problems Electronic Signature(s) Signed: 03/14/2022 8:41:01 AM By: Fredirick Maudlin MD FACS Entered By: Fredirick Maudlin on 03/14/2022  08:41:01 -------------------------------------------------------------------------------- Progress Note Details Patient Name: Date of Service: Clinton Liner RD O. 03/14/2022 8:00 A M Medical Record Number: [id number] Patient Account Number: [id number] Date of Birth/Sex: Treating RN: 04-29-1956 (66 y.o. M) Primary Care Provider: [name] Other Clinician: Referring Provider: Treating Provider/Extender: [name] in Treatment: 2 Subjective Chief Complaint Information obtained from Patient Patient presents for treatment of an open ulcer due to venous insufficiency History of Present Illness (HPI) ADMISSION 02/24/2022 This is a 66 year old man with a past medical history significant for congestive heart failure, morbid obesity, dilated cardiomyopathy, atrial fibrillation, and lower extremity cellulitis. He is currently being treated with Keflex. He is not diabetic. He does not smoke. He says that he developed some blisters on his left anterior tibial surface which subsequently broke open causing the wounds for which she is here to see [place] today. He says that he occasionally wears compression stockings but does not do so on a regular basis. He reports that "they look stupid with shorts." ABI in clinic today was 0.94. On the left anterior tibial surface, there are several small wounds in a geographic pattern. They do appear consistent with blisters that have ruptured. The fat layer is exposed and there is a thin layer of slough on the surfaces. He does have some surrounding erythema, but no purulent drainage. Edema control is very poor. 03/03/2022: The patient came in for a nurse visit last week because he thought the compression wraps were too tight. They were reapplied and apparently he did not understand the instruction to try to stay off his feet is much as possible and keep his leg elevated; he instead walked excessively on both Friday and Saturday. As  result his leg became more swollen, the wrap became uncomfortable and he cut it off yesterday. He replaced it with a wrap of his own fashion. Today he has an indentation in his leg where his homemade wrap ended. He has extensive 3+ nonpitting edema above this. The wounds themselves are in fairly decent condition with just a little bit of slough on the surface, but edema fluid is frankly pouring out of the largest wound. 03/14/2022: Significant improvement in his wound. He has come in a couple of times for nurse visits due to drainage. Today the wound is quite a bit smaller and just has a layer of eschar and slough on the surface. Edema control is better. Patient History Information obtained from Patient. Family History Cancer -  Father,Mother, Hypertension - Mother,Father, No family history of Diabetes, Heart Disease, Hereditary Spherocytosis, Kidney Disease, Lung Disease, Seizures, Thyroid Problems, Tuberculosis. 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 Medical History Eyes Patient has history of Cataracts - 2021 Ear/Nose/Mouth/Throat Denies history of Chronic sinus problems/congestion, Middle ear problems Hematologic/Lymphatic Patient has history of Lymphedema Respiratory Patient has history of Sleep Apnea Cardiovascular Patient has history of Congestive Heart Failure, Coronary Artery Disease, Hypertension Denies history of Deep Vein Thrombosis Gastrointestinal Denies history of Cirrhosis , Colitis, Crohnoos Endocrine Denies history of Type I Diabetes, Type II Diabetes Genitourinary Denies history of End Stage Renal Disease Immunological Denies history of Raynaudoos, Scleroderma Integumentary (Skin) Denies history of History of Burn Musculoskeletal Denies history of Gout, Rheumatoid Arthritis, Osteoarthritis, Osteomyelitis Neurologic Denies history of Dementia, Neuropathy, Quadriplegia,  Paraplegia, Seizure Disorder Oncologic Denies history of Received Chemotherapy, Received Radiation Psychiatric Patient has history of Confinement Anxiety Denies history of Anorexia/bulimia Hospitalization/Surgery History - Inguinal Hernia repair- 2014. - insertion of mesh-2014. - Atrial ablation surgery- 2007. - BIla eyes lasik- 2008. - hernia repair- 1977. Medical A Surgical History Notes nd Respiratory Allergic Rhinitis Cardiovascular AFIB Objective Constitutional No acute distress.. Vitals Time Taken: 8:12 AM, Height: 76 in, Weight: 322 lbs, BMI: 39.2, Temperature: 98.3 F, Pulse: 80 bpm, Respiratory Rate: 16 breaths/min, Blood Pressure: 126/86 mmHg. Respiratory Normal work of breathing on room air.. General Notes: 03/14/2022: Significant improvement in his wound. He has come in a couple of times for nurse visits due to drainage. Today the wound is quite a bit smaller and just has a layer of eschar and slough on the surface. Edema control is better. Integumentary (Hair, Skin) Wound #1 status is Open. Original cause of wound was Blister. The date acquired was: 09/25/2021. The wound has been in treatment 2 weeks. The wound is located on the Anterior Lower Leg. The wound measures 1cm length x 1.2cm width x 0.1cm depth; 0.942cm^2 area and 0.094cm^3 volume. There is Fat Layer (Subcutaneous Tissue) exposed. There is no tunneling or undermining noted. There is a large amount of serous drainage noted. The wound margin is flat and intact. There is medium (34-66%) red, pink granulation within the wound bed. There is a medium (34-66%) amount of necrotic tissue within the wound bed including Adherent Slough. Assessment Active Problems ICD-10 Non-pressure chronic ulcer of other part of left lower leg with fat layer exposed Localized edema Chronic systolic (congestive) heart failure Dilated cardiomyopathy Morbid (severe) obesity due to excess calories Essential (primary)  hypertension Procedures Wound #1 Pre-procedure diagnosis of Wound #1 is a Venous Leg Ulcer located on the Anterior Lower Leg .Severity of Tissue Pre Debridement is: Fat layer exposed. There was a Selective/Open Wound Non-Viable Tissue Debridement with a total area of 1.2 sq cm performed by Fredirick Maudlin, MD. With the following instrument(s): Curette to remove Non-Viable tissue/material. Material removed includes Eschar and Slough and after achieving pain control using Lidocaine 5% topical ointment. No specimens were taken. A time out was conducted at 08:31, prior to the start of the procedure. A Minimum amount of bleeding was controlled with Pressure. The procedure was tolerated well with a pain level of 0 throughout and a pain level of 0 following the procedure. Post Debridement Measurements: 1cm length x 1.2cm width x 0.1cm depth; 0.094cm^3 volume. Character of Wound/Ulcer Post Debridement is improved. Severity of Tissue Post Debridement is: Fat layer exposed. Post procedure Diagnosis Wound #1:  Same as Pre-Procedure Pre-procedure diagnosis of Wound #1 is a Venous Leg Ulcer located on the Anterior Lower Leg . There was a Three Layer Compression Therapy Procedure by Dellie Catholic, RN. Post procedure Diagnosis Wound #1: Same as Pre-Procedure Plan Follow-up Appointments: Return Appointment in 1 week. - Dr. Celine Ahr Rm 4 Friday 03/14/22 at 08:00AM Nurse Visit: - Room 2 Tuesday 9/5/ 23 at 11:30 Anesthetic: Wound #1 Anterior Lower Leg: (In clinic) Topical Lidocaine 4% applied to wound bed Bathing/ Shower/ Hygiene: May shower with protection but do not get wound dressing(s) wet. - use cast protector on Left lower leg Edema Control - Lymphedema / SCD / Other: Avoid standing for long periods of time. Patient to wear own compression stockings every day. - wear compression stockings Right leg WOUND #1: - Lower Leg Wound Laterality: Anterior Cleanser: Soap and Water Discharge Instructions: May  shower and wash wound with dial antibacterial soap and water prior to dressing change. Peri-Wound Care: Sween Lotion (Moisturizing lotion) Discharge Instructions: Apply moisturizing lotion as directed Topical: Mupirocin Ointment Discharge Instructions: Apply Mupirocin (Bactroban) as instructed Prim Dressing: KerraCel Ag Gelling Fiber Dressing, 4x5 in (silver alginate) ary Discharge Instructions: Apply silver alginate to wound bed as instructed Secondary Dressing: Zetuvit Plus Silicone Border Dressing 4x4 (in/in) Discharge Instructions: Apply silicone border over primary dressing as directed. Secured With: 77M Medipore Public affairs consultant Surgical T 2x10 (in/yd) ape Discharge Instructions: Secure with tape as directed. Compression Wrap: ThreePress (3 layer compression wrap) Discharge Instructions: Apply three layer compression as directed. 03/14/2022: Significant improvement in his wound. He has come in a couple of times for nurse visits due to drainage. Today the wound is quite a bit smaller and just has a layer of eschar and slough on the surface. Edema control is better. I used a curette to debride slough and eschar from his wound. We will continue to use topical mupirocin with silver alginate and 3 layer compression. Follow-up with me in 1 week. Electronic Signature(s) Signed: 03/14/2022 8:44:23 AM By: Fredirick Maudlin MD FACS Entered By: Fredirick Maudlin on 03/14/2022 08:44:23 -------------------------------------------------------------------------------- HxROS Details Patient Name: Date of Service: Clinton Liner RD O. 03/14/2022 8:00 A M Medical Record Number: [id number] Patient Account Number: [id number] Date of Birth/Sex: Treating RN: 09-20-1955 (66 y.o. M) Primary Care Provider: [name] Other Clinician: Referring Provider: Treating Provider/Extender: [name] in Treatment: 2 Information Obtained From Patient Eyes Medical History: Positive for:  Cataracts - 2021 Ear/Nose/Mouth/Throat Medical History: Negative for: Chronic sinus problems/congestion; Middle ear problems Hematologic/Lymphatic Medical History: Positive for: Lymphedema Respiratory Medical History: Positive for: Sleep Apnea Past Medical History Notes: Allergic Rhinitis Cardiovascular Medical History: Positive for: Congestive Heart Failure; Coronary Artery Disease; Hypertension Negative for: Deep Vein Thrombosis Past Medical History Notes: AFIB Gastrointestinal Medical History: Negative for: Cirrhosis ; Colitis; Crohns Endocrine Medical History: Negative for: Type I Diabetes; Type II Diabetes Genitourinary Medical History: Negative for: End Stage Renal Disease Immunological Medical History: Negative for: Raynauds; Scleroderma Integumentary (Skin) Medical History: Negative for: History of Burn Musculoskeletal Medical History: Negative for: Gout; Rheumatoid Arthritis; Osteoarthritis; Osteomyelitis Neurologic Medical History: Negative for: Dementia; Neuropathy; Quadriplegia; Paraplegia; Seizure Disorder Oncologic Medical History: Negative for: Received Chemotherapy; Received Radiation Psychiatric Medical History: Positive for: Confinement Anxiety Negative for: Anorexia/bulimia HBO Extended History Items Eyes: Cataracts Immunizations Pneumococcal Vaccine: Received Pneumococcal Vaccination: Yes Received Pneumococcal Vaccination On or After 60th Birthday: Yes Implantable Devices None Hospitalization / Surgery History Type of Hospitalization/Surgery Inguinal Hernia repair- 2014 insertion of mesh-2014 Atrial ablation surgery-  2007 BIla eyes lasik- 2008 hernia repair- 1977 Family and Social History Cancer: Yes - Father,Mother; Diabetes: No; Heart Disease: No; Hereditary Spherocytosis: No; Hypertension: Yes - Mother,Father; Kidney Disease: No; Lung Disease: No; Seizures: No; Thyroid Problems: No; Tuberculosis: No; Never smoker; Marital Status -  Married; Alcohol Use: Never; Drug Use: No History; Caffeine Use: Daily - [employer] dew; Financial Concerns: No; Food, Clothing or Shelter Needs: No; Transportation Concerns: No Electronic Signature(s) Signed: 03/14/2022 8:53:39 AM By: Fredirick Maudlin MD FACS Entered By: Fredirick Maudlin on 03/14/2022 08:43:10 -------------------------------------------------------------------------------- SuperBill Details Patient Name: Date of Service: Clinton Liner RD O. 03/14/2022 Medical Record Number: [id number] Patient Account Number: [id number] Date of Birth/Sex: Treating RN: [birth year] (66 y.o. M) Primary Care Provider: [name] Other Clinician: Referring Provider: Treating Provider/Extender: [name] in Treatment: 2 Diagnosis Coding ICD-10 Codes Code Description [phone] Non-pressure chronic ulcer of other part of left lower leg with fat layer exposed R60.0 Localized edema S97.02 Chronic systolic (congestive) heart failure I42.0 Dilated cardiomyopathy E66.01 Morbid (severe) obesity due to excess calories I10 Essential (primary) hypertension Facility Procedures CPT4 Code: [id number] Description: [id number] - DEBRIDE WOUND 1ST 20 SQ CM OR < ICD-10 Diagnosis Description 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 Modifier: d Quantity: 1 Physician Procedures : CPT4 Code Description Modifier [id number] - WC PHYS LEVEL 4 - EST PT 25 ICD-10 Diagnosis Description 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d O70.96 Chronic systolic (congestive) heart failure R60.0  Localized edema E66.01 Morbid (severe) obesity due to excess calories Quantity: 1 : [id number] - WC PHYS DEBR WO ANESTH 20 SQ CM ICD-10 Diagnosis Description 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d Quantity: 1 Electronic Signature(s) Signed: 03/14/2022 8:44:43 AM By:  Fredirick Maudlin MD FACS Entered By: Fredirick Maudlin on 03/14/2022 08:44:43

## 2022-03-17 ENCOUNTER — Encounter (HOSPITAL_BASED_OUTPATIENT_CLINIC_OR_DEPARTMENT_OTHER): Payer: 59 | Admitting: General Surgery

## 2022-03-17 DIAGNOSIS — L97822 Non-pressure chronic ulcer of other part of left lower leg with fat layer exposed: Secondary | ICD-10-CM | POA: Diagnosis not present

## 2022-03-19 ENCOUNTER — Encounter (HOSPITAL_BASED_OUTPATIENT_CLINIC_OR_DEPARTMENT_OTHER): Payer: 59 | Admitting: General Surgery

## 2022-03-19 DIAGNOSIS — L97822 Non-pressure chronic ulcer of other part of left lower leg with fat layer exposed: Secondary | ICD-10-CM | POA: Diagnosis not present

## 2022-03-19 NOTE — Progress Notes (Signed)
[name], [name] ([id number]) Visit Report for 03/19/2022 SuperBill Details Patient Name: Date of Service: [facility] RD O. 03/19/2022 Medical Record Number: [id number] Patient Account Number: [id number] Date of Birth/Sex: Treating RN: 02-Apr-1956 (66 y.o. [name] Primary Care Provider: [name] Other Clinician: Referring Provider: Treating Provider/Extender: [name] in Treatment: 3 Diagnosis Coding ICD-10 Codes Code Description [phone] Non-pressure chronic ulcer of other part of left lower leg with fat layer exposed R60.0 Localized edema V89.38 Chronic systolic (congestive) heart failure I42.0 Dilated cardiomyopathy E66.01 Morbid (severe) obesity due to excess calories I10 Essential (primary) hypertension Facility Procedures CPT4 Code Description Modifier Quantity [id number] (Facility Use Only) [phone] - [place] EUMPNT LWR LT LEG 1 ICD-10 Diagnosis Description 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d Electronic Signature(s) Signed: 03/19/2022 10:10:05 AM By: Fredirick Maudlin MD FACS Signed: 03/19/2022 4:45:05 PM By: Blanche East RN Entered By: Blanche East on 03/19/2022 07:51:57

## 2022-03-19 NOTE — Progress Notes (Signed)
[name], [name] ([id number]) Visit Report for 03/17/2022 Arrival Information Details Patient Name: Date of Service: [name] 03/17/2022 7:30 A M Medical Record Number: [id number] Patient Account Number: [id number] Date of Birth/Sex: Treating RN: 1955/10/05 (66 y.o. [name] Primary Care Brittanyann Wittner: [name] Other Clinician: Referring Candace Ramus: Treating Arul Farabee/Extender: [name] in Treatment: 3 Visit Information History Since Last Visit All ordered tests and consults were completed: Yes Patient Arrived: Ambulatory Added or deleted any medications: No Arrival Time: 07:56 Any new allergies or adverse reactions: No Accompanied By: self Had a fall or experienced change in No Transfer Assistance: None activities of daily living that may affect Patient Requires Transmission-Based Precautions: No risk of falls: Patient Has Alerts: No Signs or symptoms of abuse/neglect since last visito No Hospitalized since last visit: No Implantable device outside of the clinic excluding No cellular tissue based products placed in the center since last visit: Has Dressing in Place as Prescribed: Yes Has Compression in Place as Prescribed: Yes Pain Present Now: No Electronic Signature(s) Signed: 03/19/2022 4:45:05 PM By: Blanche East RN Entered By: Blanche East on 03/17/2022 07:58:12 -------------------------------------------------------------------------------- Compression Therapy Details Patient Name: Date of Service: [name] 03/17/2022 7:30 A M Medical Record Number: [id number] Patient Account Number: [id number] Date of Birth/Sex: Treating RN: March 17, 1956 (66 y.o. [name] Primary Care Marvie Brevik: [name] Other Clinician: Referring Nary Sneed: Treating Tiffny Gemmer/Extender: [name] in Treatment: 3 Compression Therapy Performed for Wound Assessment: Wound #1 Anterior Lower Leg Performed  By: Clinician Blanche East, RN Compression Type: Three Layer Electronic Signature(s) Signed: 03/19/2022 4:45:05 PM By: Blanche East RN Entered By: Blanche East on 03/17/2022 07:59:19 -------------------------------------------------------------------------------- Encounter Discharge Information Details Patient Name: Date of Service: [name] 03/17/2022 7:30 A M Medical Record Number: [id number] Patient Account Number: [id number] Date of Birth/Sex: Treating RN: 1955/11/05 (67 y.o. [name] Primary Care Maddisyn Hegwood: [name] Other Clinician: Referring Alfonzia Woolum: Treating Tamlyn Sides/Extender: [name] in Treatment: 3 Encounter Discharge Information Items Discharge Condition: Stable Ambulatory Status: Ambulatory Discharge Destination: Home Transportation: Private Auto Accompanied By: self Schedule Follow-up Appointment: Yes Clinical Summary of Care: Electronic Signature(s) Signed: 03/19/2022 4:45:05 PM By: Blanche East RN Entered By: Blanche East on 03/17/2022 07:59:50 -------------------------------------------------------------------------------- Patient/Caregiver Education Details Patient Name: Date of Service: [name] 9/11/2023andnbsp7:30 [place] Record Number: [id number] Patient Account Number: [id number] Date of Birth/Gender: Treating RN: 1956-03-01 (66 y.o. [name] Primary Care Physician: [name] Other Clinician: Referring Physician: Treating Physician/Extender: [name] in Treatment: 3 Education Assessment Education Provided To: Patient Education Topics Provided Wound/Skin Impairment: Methods: Explain/Verbal Responses: Reinforcements needed, State content correctly Electronic Signature(s) Signed: 03/19/2022 4:45:05 PM By: Blanche East RN Entered By: Blanche East on 03/17/2022  07:59:38 -------------------------------------------------------------------------------- Wound Assessment Details Patient Name: Date of Service: [name] 03/17/2022 7:30 A M Medical Record Number: [id number] Patient Account Number: [id number] Date of Birth/Sex: Treating RN: Apr 01, 1956 (66 y.o. [name] Primary Care Letita Prentiss: [name] Other Clinician: Referring Hagar Sadiq: Treating Bricyn Labrada/Extender: [name] in Treatment: 3 Wound Status Wound Number: 1 Primary Venous Leg Ulcer Etiology: Wound Location: Anterior Lower Leg Wound Open Wounding Event: Blister Status: Date Acquired: 09/25/2021 Comorbid Cataracts, Lymphedema, Sleep Apnea, Congestive Heart Failure, Weeks Of Treatment: 3 History: Coronary Artery Disease, Hypertension, Confinement Anxiety Clustered Wound: Yes Wound Measurements Length: (cm) 1 Width: (cm) 1.2  Depth: (cm) 0.1 Area: (cm) 0.942 Volume: (cm) 0.094 % Reduction in Area: 81.3% % Reduction in Volume: 81.3% Epithelialization: Large (67-100%) Tunneling: No Undermining: No Wound Description Classification: Full Thickness Without Exposed Support Structures Wound Margin: Flat and Intact Exudate Amount: Large Exudate Type: Serous Exudate Color: amber Foul Odor After Cleansing: No Slough/Fibrino Yes Wound Bed Granulation Amount: Medium (34-66%) Exposed Structure Granulation Quality: Red, Pink Fascia Exposed: No Necrotic Amount: Medium (34-66%) Fat Layer (Subcutaneous Tissue) Exposed: Yes Necrotic Quality: Adherent Slough Tendon Exposed: No Muscle Exposed: No Joint Exposed: No Bone Exposed: No Electronic Signature(s) Signed: 03/19/2022 4:45:05 PM By: Blanche East RN Entered By: Blanche East on 03/17/2022 07:58:59 -------------------------------------------------------------------------------- Vitals Details Patient Name: Date of Service: [name] 03/17/2022 7:30 A M Medical  Record Number: [id number] Patient Account Number: [id number] Date of Birth/Sex: Treating RN: 1955-12-01 (66 y.o. [name] Primary Care Michala Deblanc: [name] Other Clinician: Referring Layza Summa: Treating Fumiye Lubben/Extender: [name] in Treatment: 3 Vital Signs Time Taken: 07:30 Temperature (F): 97.7 Height (in): 76 Pulse (bpm): 85 Weight (lbs): 322 Respiratory Rate (breaths/min): 18 Body Mass Index (BMI): 39.2 Blood Pressure (mmHg): 135/93 Reference Range: 80 - 120 mg / dl Electronic Signature(s) Signed: 03/19/2022 4:45:05 PM By: Blanche East RN Entered By: Blanche East on 03/17/2022 07:58:38

## 2022-03-19 NOTE — Progress Notes (Signed)
LAVAN, IMES ([id number]) Visit Report for 03/17/2022 SuperBill Details Patient Name: Date of Service: [name] RD O. 03/17/2022 Medical Record Number: [id number] Patient Account Number: [id number] Date of Birth/Sex: Treating RN: 1956/02/21 (66 y.o. [name] Primary Care Provider: [name] Other Clinician: Referring Provider: Treating Provider/Extender: [name] in Treatment: 3 Diagnosis Coding ICD-10 Codes Code Description [phone] Non-pressure chronic ulcer of other part of left lower leg with fat layer exposed R60.0 Localized edema Y65.99 Chronic systolic (congestive) heart failure I42.0 Dilated cardiomyopathy E66.01 Morbid (severe) obesity due to excess calories I10 Essential (primary) hypertension Facility Procedures CPT4 Code Description Modifier Quantity [id number] (Facility Use Only) [phone] - [place] QZRAQT LWR LT LEG 1 ICD-10 Diagnosis Description 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d Electronic Signature(s) Signed: 03/17/2022 8:20:12 AM By: Fredirick Maudlin MD FACS Signed: 03/19/2022 4:45:05 PM By: Blanche East RN Entered By: Blanche East on 03/17/2022 08:00:07

## 2022-03-19 NOTE — Progress Notes (Signed)
HPI: FU atrial fibrillation. The patient had a cardiac catheterization in 2004 that showed an ejection fraction of 45% and normal coronary arteries. He did have atrial fibrillation at that time and his LV function improved after sinus rhythm was restored. He ultimately had atrial fibrillation ablation. He did well for several years but then recurrent atrial fibrillation. Transesophageal echocardiogram August 2020 showed normal LV function, severe left atrial enlargement, moderate to severe mitral regurgitation and mild tricuspid regurgitation. Patient had cardioversion March 04, 2019 but atrial fibrillation recurred. Patient seen by Dr. Rayann Heman and repeat ablation felt not indicated as chances of maintaining sinus would be lower given obesity and severe left atrial enlargement.  Plan was to potentially consider referral for surgical Maze procedure and mitral valve repair. Follow-up transthoracic echocardiogram October 2020 showed normal LV function, moderate left atrial enlargement and trace mitral regurgitation. Patient subsequently placed on amiodarone in the atrial fibrillation clinic. Also referred for evaluation of sleep apnea. Repeat attempt at cardioversion December 2020 unsuccessful. Dr. Roxy Manns evaluated patient for consideration of surgical maze procedure and felt that medical therapy was best option. Most recent echocardiogram 9/22 showed normal LV function, mild LVH, mild RVE, mild LAE, mild to moderate MR. Since last seen, patient has some dyspnea on exertion worse over the past 10 days.  No orthopnea or PND but he does have chronic pedal edema which is somewhat worse.  No exertional chest pain, palpitations or syncope.  Note he is taking additional carvedilol when his blood pressure is elevated which makes his heart rate dropped into the 40s.  Current Outpatient Medications  Medication Sig Dispense Refill   carvedilol (COREG) 25 MG tablet Take 2 tablets (50 mg total) by mouth 2 (two) times  daily. 360 tablet 3   diltiazem (CARDIZEM CD) 360 MG 24 hr capsule TAKE 1 CAPSULE(360 MG) BY MOUTH DAILY 90 capsule 0   DULoxetine (CYMBALTA) 60 MG capsule SMARTSIG:1 Capsule(s) By Mouth Every Evening     ELIQUIS 5 MG TABS tablet TAKE 1 TABLET(5 MG) BY MOUTH TWICE DAILY 60 tablet 11   gabapentin (NEURONTIN) 100 MG capsule Take 2 capsules (200 mg total) by mouth 3 (three) times daily. 180 capsule 0   hydrALAZINE (APRESOLINE) 50 MG tablet Take 1 tablet (50 mg total) by mouth 3 (three) times daily. 270 tablet 3   rOPINIRole (REQUIP) 1 MG tablet Take 1-2 tablets (1-2 mg total) by mouth at bedtime as needed (restless leg syndrome). 1 tablet after lunch and 2 tablet at bedtime 120 tablet 0   torsemide (DEMADEX) 20 MG tablet Take 3 tablets (60 mg total) by mouth 2 (two) times daily. 540 tablet 3   No current facility-administered medications for this visit.     Past Medical History:  Diagnosis Date   Allergic rhinitis    Anxiety    Asthma    as a child   Chronic low back pain    Complication of anesthesia    "hard to put asleep, hard to wake up, nausea and vomiting"   Depression    ED (erectile dysfunction)    GERD (gastroesophageal reflux disease)    HLD (hyperlipidemia)    HTN (hypertension)    sees Dr. [name], [facility]   Hypertrophy of prostate with urinary obstruction and other lower urinary tract symptoms (LUTS)    IBS (irritable bowel syndrome)    Inguinal hernia without mention of obstruction or gangrene, unilateral or unspecified, (not specified as recurrent)    Insomnia  Lumbar herniated disc    L7   Morbid obesity (HCC)    Narcotic addiction ([place])    NICM (nonischemic cardiomyopathy) ([place])    tachycardia mediated,  resolved with sinus   Persistent atrial fibrillation ([place])    ablation done 03/2006 at [facility]   Pneumonia    hx of 2004   PONV (postoperative nausea and vomiting)    Twitching    legs    Past Surgical History:  Procedure Laterality Date    ATRIAL ABLATION SURGERY  2007   [facility]   CARDIOVERSION  08/11/2011   Procedure: CARDIOVERSION;  Surgeon: Lelon Perla, MD;  Location: [place];  Service: Cardiovascular;  Laterality: N/A;   CARDIOVERSION N/A 04/16/2017   Procedure: CARDIOVERSION;  Surgeon: Sanda Klein, MD;  Location: [place];  Service: Cardiovascular;  Laterality: N/A;   CARDIOVERSION N/A 03/04/2019   Procedure: CARDIOVERSION;  Surgeon: Lelon Perla, MD;  Location: [facility] ENDOSCOPY;  Service: Cardiovascular;  Laterality: N/A;   CARDIOVERSION N/A 07/04/2019   Procedure: CARDIOVERSION;  Surgeon: [name], MD;  Location: [place];  Service: Cardiovascular;  Laterality: N/A;   EYE SURGERY     lasik, [place] Right 08/26/2012   Procedure: LAPAROSCOPIC INGUINAL HERNIA;  Surgeon: Madilyn Hook, DO;  Location: [place];  Service: General;  Laterality: Right;  laparoscopic right inguinal hernia repair with mesh, umbilical hernia repair   INSERTION OF MESH Right 08/26/2012   Procedure: INSERTION OF MESH;  Surgeon: Madilyn Hook, DO;  Location: [place];  Service: General;  Laterality: Right;  right inguinal hernia   TEE WITHOUT CARDIOVERSION N/A 03/04/2019   Procedure: TRANSESOPHAGEAL ECHOCARDIOGRAM (TEE);  Surgeon: Lelon Perla, MD;  Location: [place];  Service: Cardiovascular;  Laterality: N/A;   UMBILICAL HERNIA REPAIR N/A 08/26/2012   Procedure: HERNIA REPAIR UMBILICAL ADULT;  Surgeon: Madilyn Hook, DO;  Location: [facility];  Service: General;  Laterality: N/A;    Social History   Socioeconomic History   Marital status: Married    Spouse name: Wyeth Hoffer   Number of children: 3   Years of education: Not on file   Highest education level: Bachelor's degree (e.g., BA, AB, BS)  Occupational History   Occupation: retired  Tobacco Use   Smoking status: Never   Smokeless tobacco: Never  Vaping Use   Vaping Use: Never used  Substance and Sexual Activity    Alcohol use: No    Alcohol/week: 0.0 standard drinks of alcohol   Drug use: No   Sexual activity: Yes  Other Topics Concern   Not on file  Social History Narrative   Lives in [place] Strain: Low Risk  (04/17/2021)   Overall Financial Resource Strain (CARDIA)    Difficulty of Paying Living Expenses: Not hard at all  Food Insecurity: No Food Insecurity (04/17/2021)   Hunger Vital Sign    Worried About Running Out of Food in the Last Year: Never true    Ran Out of Food in the Last Year: Never true  Transportation Needs: No Transportation Needs (04/17/2021)   PRAPARE - Hydrologist (Medical): No    Lack of Transportation (Non-Medical): No  Physical Activity: Not on file  Stress: Not on file  Social Connections: Not on file  Intimate Partner Violence: Not on file    Family History  Problem Relation Age of Onset  Asthma Mother    Breast cancer Mother    Hypertension Mother    COPD Father    Prostate cancer Father    Hypertension Father    Lymphoma Sister     ROS: Back and leg pain but no fevers or chills, productive cough, hemoptysis, dysphasia, odynophagia, melena, hematochezia, dysuria, hematuria, rash, seizure activity, orthopnea, PND, claudication. Remaining systems are negative.  Physical Exam: Well-developed obese in no acute distress.  Skin is warm and dry.  HEENT is normal.  Neck is supple.  Chest is clear to auscultation with normal expansion.  Cardiovascular exam is irregular Abdominal exam nontender or distended. No masses palpated. Extremities show 1+ edema. neuro grossly intact  A/P  1 permanent atrial fibrillation-continue Cardizem and carvedilol for rate control.  Continue apixaban.  He has taken additional carvedilol for his blood pressure which has made his heart rate dropped into the 40s with associated dizziness.  I asked him to avoid this.  2 chronic diastolic  congestive heart failure-he is mildly volume overloaded on examination.  He is taken 40 mg of Demadex 3 times daily.  I have asked him to take 60 mg twice daily with an additional 20 mg as needed for worsening edema.  We discussed fluid restriction and low-sodium diet.  3 hypertension-blood pressure has been elevated at home.  Increase hydralazine to 100 mg p.o. 3 times daily and follow.  4 mild to moderate mitral regurgitation-noted on most recent echocardiogram.  He will need follow-up study and we will arrange.  5 history of cardiomyopathy-LV function improved on most recent echocardiogram.  6 obesity-we discussed the importance of weight loss.  7 chronic stage III kidney disease-patient is scheduled to see nephrology tomorrow.  8 snoring-we will refer for sleep study to rule out sleep apnea and treat as needed.  Kirk Ruths, MD

## 2022-03-19 NOTE — Progress Notes (Signed)
[name], [name] ([id number]) Visit Report for 03/19/2022 Arrival Information Details Patient Name: Date of Service: [name] RD O. 03/19/2022 7:30 A M Medical Record Number: [id number] Patient Account Number: [id number] Date of Birth/Sex: Treating RN: 1955/11/06 (66 y.o. [name] Primary Care Tove Wideman: [name] Other Clinician: Referring Yitta Gongaware: Treating Kolbee Stallman/Extender: [name] in Treatment: 3 Visit Information History Since Last Visit All ordered tests and consults were completed: Yes Patient Arrived: Ambulatory Added or deleted any medications: No Arrival Time: 07:34 Any new allergies or adverse reactions: No Accompanied By: self Had a fall or experienced change in No Transfer Assistance: None activities of daily living that may affect Patient Requires Transmission-Based Precautions: No risk of falls: Patient Has Alerts: No Signs or symptoms of abuse/neglect since last visito No Hospitalized since last visit: No Implantable device outside of the clinic excluding No cellular tissue based products placed in the center since last visit: Has Dressing in Place as Prescribed: Yes Has Compression in Place as Prescribed: Yes Pain Present Now: No Electronic Signature(s) Signed: 03/19/2022 4:45:05 PM By: Blanche East RN Entered By: Blanche East on 03/19/2022 07:36:05 -------------------------------------------------------------------------------- Compression Therapy Details Patient Name: Date of Service: [name] RD O. 03/19/2022 7:30 A M Medical Record Number: [id number] Patient Account Number: [id number] Date of Birth/Sex: Treating RN: May 04, 1956 (66 y.o. [name] Primary Care Vayda Dungee: [name] Other Clinician: Referring Dawana Asper: Treating Arda Daggs/Extender: [name] in Treatment: 3 Compression Therapy Performed for Wound Assessment: Wound #1 Anterior Lower Leg Performed  By: Clinician Blanche East, RN Compression Type: Three Layer Electronic Signature(s) Signed: 03/19/2022 4:45:05 PM By: Blanche East RN Entered By: Blanche East on 03/19/2022 07:37:12 -------------------------------------------------------------------------------- Encounter Discharge Information Details Patient Name: Date of Service: [name] RD O. 03/19/2022 7:30 A M Medical Record Number: [id number] Patient Account Number: [id number] Date of Birth/Sex: Treating RN: 1955-07-30 (66 y.o. [name] Primary Care [name]: [name] Other Clinician: Referring [name]: Treating [name]/Extender: [name] in Treatment: 3 Encounter Discharge Information Items Discharge Condition: Stable Ambulatory Status: Ambulatory Discharge Destination: Home Transportation: Private Auto Accompanied By: self Schedule Follow-up Appointment: Yes Clinical Summary of Care: Electronic Signature(s) Signed: 03/19/2022 4:45:05 PM By: Blanche East RN Entered By: Blanche East on 03/19/2022 07:51:38 -------------------------------------------------------------------------------- Patient/Caregiver Education Details Patient Name: Date of Service: [name] RD O. 9/13/2023andnbsp7:30 [place] Record Number: [id number] Patient Account Number: [id number] Date of Birth/Gender: Treating RN: Mar 31, 1956 (66 y.o. [name] Primary Care Physician: [name] Other Clinician: Referring Physician: Treating Physician/Extender: [name] in Treatment: 3 Education Assessment Education Provided To: Patient Education Topics Provided Wound/Skin Impairment: Methods: Explain/Verbal Responses: Reinforcements needed, State content correctly Electronic Signature(s) Signed: 03/19/2022 4:45:05 PM By: Blanche East RN Entered By: Blanche East on 03/19/2022  07:51:27 -------------------------------------------------------------------------------- Wound Assessment Details Patient Name: Date of Service: [name] RD O. 03/19/2022 7:30 A M Medical Record Number: [id number] Patient Account Number: [id number] Date of Birth/Sex: Treating RN: 10-25-1955 (66 y.o. [name] Primary Care [name]: [name] Other Clinician: Referring [name]: Treating [name]/Extender: [name] in Treatment: 3 Wound Status Wound Number: 1 Primary Venous Leg Ulcer Etiology: Wound Location: Anterior Lower Leg Wound Open Wounding Event: Blister Status: Date Acquired: 09/25/2021 Comorbid Cataracts, Lymphedema, Sleep Apnea, Congestive Heart Failure, Weeks Of Treatment: 3 History: Coronary Artery Disease, Hypertension, Confinement Anxiety Clustered Wound: Yes Wound Measurements Length: (cm) 1 Width: (cm) 1.2  Depth: (cm) 0.1 Area: (cm) 0.942 Volume: (cm) 0.094 % Reduction in Area: 81.3% % Reduction in Volume: 81.3% Epithelialization: Large (67-100%) Tunneling: No Undermining: No Wound Description Classification: Full Thickness Without Exposed Support Structures Wound Margin: Flat and Intact Exudate Amount: Large Exudate Type: Serous Exudate Color: amber Foul Odor After Cleansing: No Slough/Fibrino Yes Wound Bed Granulation Amount: Medium (34-66%) Exposed Structure Granulation Quality: Red, Pink Fascia Exposed: No Necrotic Amount: Medium (34-66%) Fat Layer (Subcutaneous Tissue) Exposed: Yes Necrotic Quality: Adherent Slough Tendon Exposed: No Muscle Exposed: No Joint Exposed: No Bone Exposed: No Treatment Notes Wound #1 (Lower Leg) Wound Laterality: Anterior Cleanser Soap and Water Discharge Instruction: May shower and wash wound with dial antibacterial soap and water prior to dressing change. Peri-Wound Care Sween Lotion (Moisturizing lotion) Discharge Instruction: Apply moisturizing lotion as  directed Topical Mupirocin Ointment Discharge Instruction: Apply Mupirocin (Bactroban) as instructed Primary Dressing KerraCel Ag Gelling Fiber Dressing, 4x5 in (silver alginate) Discharge Instruction: Apply silver alginate to wound bed as instructed Secondary Dressing Zetuvit Plus Silicone Border Dressing 4x4 (in/in) Discharge Instruction: Apply silicone border over primary dressing as directed. Secured With [employer] Surgical T 2x10 (in/yd) ape Discharge Instruction: Secure with tape as directed. Compression Wrap ThreePress (3 layer compression wrap) Discharge Instruction: Apply three layer compression as directed. Compression Stockings Add-Ons Electronic Signature(s) Signed: 03/19/2022 4:45:05 PM By: Blanche East RN Entered By: Blanche East on 03/19/2022 07:36:51 -------------------------------------------------------------------------------- Vitals Details Patient Name: Date of Service: [name] RD O. 03/19/2022 7:30 A M Medical Record Number: [id number] Patient Account Number: [id number] Date of Birth/Sex: Treating RN: 1956/02/18 (66 y.o. [name] Primary Care Rorik Vespa: Maury [name] Other Clinician: Referring Anajah Sterbenz: Treating Alayja Armas/Extender: [name] in Treatment: 3 Vital Signs Time Taken: 07:36 Temperature (F): 97.8 Height (in): 76 Pulse (bpm): 86 Weight (lbs): 322 Respiratory Rate (breaths/min): 18 Body Mass Index (BMI): 39.2 Blood Pressure (mmHg): 154/104 Reference Range: 80 - 120 mg / dl Electronic Signature(s) Signed: 03/19/2022 4:45:05 PM By: Blanche East RN Entered By: Blanche East on 03/19/2022 07:36:31

## 2022-03-21 ENCOUNTER — Encounter (HOSPITAL_BASED_OUTPATIENT_CLINIC_OR_DEPARTMENT_OTHER): Payer: 59 | Admitting: General Surgery

## 2022-03-21 DIAGNOSIS — L97822 Non-pressure chronic ulcer of other part of left lower leg with fat layer exposed: Secondary | ICD-10-CM | POA: Diagnosis not present

## 2022-03-21 NOTE — Progress Notes (Signed)
[name], [name] ([id number]) Visit Report for 03/21/2022 Arrival Information Details Patient Name: Date of Service: [name] RD O. 03/21/2022 8:45 A M Medical Record Number: [id number] Patient Account Number: [id number] Date of Birth/Sex: Treating RN: 12/16/1955 (66 y.o. [name] Primary Care [name]: [name] Other Clinician: Referring Landrum Carbonell: Treating [name]/Extender: [name] in Treatment: 3 Visit Information History Since Last Visit All ordered tests and consults were completed: Yes Patient Arrived: Ambulatory Added or deleted any medications: No Arrival Time: 08:57 Any new allergies or adverse reactions: No Accompanied By: self Had a fall or experienced change in No Transfer Assistance: None activities of daily living that may affect Patient Requires Transmission-Based Precautions: No risk of falls: Patient Has Alerts: No Signs or symptoms of abuse/neglect since last visito No Hospitalized since last visit: No Implantable device outside of the clinic excluding No cellular tissue based products placed in the center since last visit: Has Dressing in Place as Prescribed: Yes Has Compression in Place as Prescribed: Yes Pain Present Now: No Electronic Signature(s) Signed: 03/21/2022 5:03:05 PM By: Blanche East RN Entered By: Blanche East on 03/21/2022 08:57:47 -------------------------------------------------------------------------------- Compression Therapy Details Patient Name: Date of Service: [name] RD O. 03/21/2022 8:45 A M Medical Record Number: [id number] Patient Account Number: [id number] Date of Birth/Sex: Treating RN: 01-23-1956 (66 y.o. [name] Primary Care [name]: [name] Other Clinician: Referring Ivee Poellnitz: Treating [name]/Extender: [name] in Treatment: 3 Compression Therapy Performed for Wound Assessment: Wound #1 Anterior Lower Leg Performed  By: Clinician Blanche East, RN Compression Type: Three Layer Post Procedure Diagnosis Same as Pre-procedure Electronic Signature(s) Signed: 03/21/2022 5:03:05 PM By: Blanche East RN Entered By: Blanche East on 03/21/2022 09:09:46 -------------------------------------------------------------------------------- Encounter Discharge Information Details Patient Name: Date of Service: [name] RD O. 03/21/2022 8:45 A M Medical Record Number: [id number] Patient Account Number: [id number] Date of Birth/Sex: Treating RN: 10-Sep-1955 (66 y.o. [name] Primary Care Betsi Crespi: [name] Other Clinician: Referring [name]: Treating [name]/Extender: [name] in Treatment: 3 Encounter Discharge Information Items Post Procedure Vitals Discharge Condition: Stable Temperature (F): 97.9 Ambulatory Status: Ambulatory Pulse (bpm): 67 Discharge Destination: Home Respiratory Rate (breaths/min): 18 Transportation: Private Auto Blood Pressure (mmHg): 136/88 Accompanied By: self Schedule Follow-up Appointment: Yes Clinical Summary of Care: Electronic Signature(s) Signed: 03/21/2022 5:03:05 PM By: Blanche East RN Entered By: Blanche East on 03/21/2022 09:26:47 -------------------------------------------------------------------------------- Lower Extremity Assessment Details Patient Name: Date of Service: [name] RD O. 03/21/2022 8:45 A M Medical Record Number: [id number] Patient Account Number: [id number] Date of Birth/Sex: Treating RN: 05/19/1956 (66 y.o. [name] Primary Care Etheridge Geil: [name] Other Clinician: Referring [name]: Treating [name]/Extender: [name] in Treatment: 3 Edema Assessment Assessed: [Left: No] [Right: No] E[Left: dema] [Right: :] Calf Left: Right: Point of Measurement: 38 cm From Medial Instep 49 cm Ankle Left: Right: Point of Measurement: 11 cm From Medial  Instep 28 cm Vascular Assessment Pulses: Dorsalis Pedis Palpable: [Left:Yes] Electronic Signature(s) Signed: 03/21/2022 5:03:05 PM By: Blanche East RN Entered By: Blanche East on 03/21/2022 09:01:31 -------------------------------------------------------------------------------- Multi Wound Chart Details Patient Name: Date of Service: [name] RD O. 03/21/2022 8:45 A M Medical Record Number: [id number] Patient Account Number: [id number] Date of Birth/Sex: Treating RN: 02/20/1956 (66 y.o. M) Primary Care Mizani Dilday: [name] Other Clinician: Referring Tilford Deaton: Treating [name]/Extender: [name] in Treatment: 3 Vital Signs Height(in): 76 Pulse(bpm):  67 Weight(lbs): 322 Blood Pressure(mmHg): 136/88 Body Mass Index(BMI): 39.2 Temperature(F): 97.9 Respiratory Rate(breaths/min): 18 Photos: [N/A:N/A] Anterior Lower Leg N/A N/A Wound Location: Blister N/A N/A Wounding Event: Venous Leg Ulcer N/A N/A Primary Etiology: Cataracts, Lymphedema, Sleep N/A N/A Comorbid History: Apnea, Congestive Heart Failure, Coronary Artery Disease, Hypertension, Confinement Anxiety 09/25/2021 N/A N/A Date Acquired: 3 N/A N/A Weeks of Treatment: Open N/A N/A Wound Status: No N/A N/A Wound Recurrence: Yes N/A N/A Clustered Wound: 0.5x0.5x0.1 N/A N/A Measurements L x W x D (cm) 0.196 N/A N/A A (cm) : rea 0.02 N/A N/A Volume (cm) : 96.10% N/A N/A % Reduction in A rea: 96.00% N/A N/A % Reduction in Volume: Full Thickness Without Exposed N/A N/A Classification: Support Structures Large N/A N/A Exudate A mount: Serous N/A N/A Exudate Type: amber N/A N/A Exudate Color: Flat and Intact N/A N/A Wound Margin: Medium (34-66%) N/A N/A Granulation A mount: Red, Pink N/A N/A Granulation Quality: Medium (34-66%) N/A N/A Necrotic A mount: Fat Layer (Subcutaneous Tissue): Yes N/A N/A Exposed Structures: Fascia: No Tendon: No Muscle:  No Joint: No Bone: No Large (67-100%) N/A N/A Epithelialization: Debridement - Selective/Open Wound N/A N/A Debridement: Pre-procedure Verification/Time Out 09:07 N/A N/A Taken: Lidocaine 5% topical ointment N/A N/A Pain Control: Necrotic/Eschar, Slough N/A N/A Tissue Debrided: Non-Viable Tissue N/A N/A Level: 1 N/A N/A Debridement A (sq cm): rea Curette N/A N/A Instrument: Minimum N/A N/A Bleeding: Pressure N/A N/A Hemostasis A chieved: 0 N/A N/A Procedural Pain: 0 N/A N/A Post Procedural Pain: Procedure was tolerated well N/A N/A Debridement Treatment Response: 0.5x0.5x0.1 N/A N/A Post Debridement Measurements L x W x D (cm) 0.02 N/A N/A Post Debridement Volume: (cm) Compression Therapy N/A N/A Procedures Performed: Debridement Treatment Notes Electronic Signature(s) Signed: 03/21/2022 9:18:01 AM By: Fredirick Maudlin MD FACS Entered By: Fredirick Maudlin on 03/21/2022 09:18:00 -------------------------------------------------------------------------------- Multi-Disciplinary Care Plan Details Patient Name: Date of Service: [name] RD O. 03/21/2022 8:45 A M Medical Record Number: [id number] Patient Account Number: [id number] Date of Birth/Sex: Treating RN: 1956-06-08 (66 y.o. [name] Primary Care Nam Vossler: [name] Other Clinician: Referring Anayla Giannetti: Treating Dina Mobley/Extender: [name] in Treatment: 3 Active Inactive Venous Leg Ulcer Nursing Diagnoses: Potential for venous Insuffiency (use before diagnosis confirmed) Goals: Patient will maintain optimal edema control Date Initiated: 02/24/2022 Target Resolution Date: 03/31/2022 Goal Status: Active Interventions: Assess peripheral edema status every visit. Compression as ordered Notes: Wound/Skin Impairment Nursing Diagnoses: Impaired tissue integrity Goals: Ulcer/skin breakdown will have a volume reduction of 30% by week 4 Date Initiated:  02/24/2022 Target Resolution Date: 03/31/2022 Goal Status: Active Interventions: Assess patient/caregiver ability to perform ulcer/skin care regimen upon admission and as needed Treatment Activities: Topical wound management initiated : 02/24/2022 Notes: Electronic Signature(s) Signed: 03/21/2022 5:03:05 PM By: Blanche East RN Entered By: Blanche East on 03/21/2022 09:04:22 -------------------------------------------------------------------------------- Pain Assessment Details Patient Name: Date of Service: [name] RD O. 03/21/2022 8:45 A M Medical Record Number: [id number] Patient Account Number: [id number] Date of Birth/Sex: Treating RN: 1955/11/22 (66 y.o. [name] Primary Care Helia Haese: [name] Other Clinician: Referring Peggie Hornak: Treating Dare Sanger/Extender: [name] in Treatment: 3 Active Problems Location of Pain Severity and Description of Pain Patient Has Paino No Site Locations Pain Management and Medication Current Pain Management: Electronic Signature(s) Signed: 03/21/2022 5:03:05 PM By: Blanche East RN Entered By: Blanche East on 03/21/2022 08:58:20 -------------------------------------------------------------------------------- Patient/Caregiver Education Details Patient Name: Date of Service: SHA Ronaldo Miyamoto RD O. 9/15/2023andnbsp8:45  A M Medical Record Number: [id number] Patient Account Number: [id number] Date of Birth/Gender: Treating RN: 06/08/1956 (66 y.o. [name] Primary Care Physician: [name] Other Clinician: Referring Physician: Treating Physician/Extender: [name] in Treatment: 3 Education Assessment Education Provided To: Patient Education Topics Provided Wound/Skin Impairment: Methods: Explain/Verbal Responses: Reinforcements needed, State content correctly Electronic Signature(s) Signed: 03/21/2022 5:03:05 PM By: Blanche East RN Entered By:  Blanche East on 03/21/2022 09:04:35 -------------------------------------------------------------------------------- Wound Assessment Details Patient Name: Date of Service: [name] RD O. 03/21/2022 8:45 A M Medical Record Number: [id number] Patient Account Number: [id number] Date of Birth/Sex: Treating RN: 12/02/1955 (66 y.o. M) Primary Care Dalayza Zambrana: [name] Other Clinician: Referring Mayrene Bastarache: Treating Jayen Bromwell/Extender: [name] in Treatment: 3 Wound Status Wound Number: 1 Primary Venous Leg Ulcer Etiology: Wound Location: Anterior Lower Leg Wound Open Wounding Event: Blister Status: Date Acquired: 09/25/2021 Comorbid Cataracts, Lymphedema, Sleep Apnea, Congestive Heart Failure, Weeks Of Treatment: 3 History: Coronary Artery Disease, Hypertension, Confinement Anxiety Clustered Wound: Yes Photos Wound Measurements Length: (cm) 0.5 Width: (cm) 0.5 Depth: (cm) 0.1 Area: (cm) 0.196 Volume: (cm) 0.02 % Reduction in Area: 96.1% % Reduction in Volume: 96% Epithelialization: Large (67-100%) Wound Description Classification: Full Thickness Without Exposed Support Structures Wound Margin: Flat and Intact Exudate Amount: Large Exudate Type: Serous Exudate Color: amber Foul Odor After Cleansing: No Slough/Fibrino Yes Wound Bed Granulation Amount: Medium (34-66%) Exposed Structure Granulation Quality: Red, Pink Fascia Exposed: No Necrotic Amount: Medium (34-66%) Fat Layer (Subcutaneous Tissue) Exposed: Yes Necrotic Quality: Adherent Slough Tendon Exposed: No Muscle Exposed: No Joint Exposed: No Bone Exposed: No Treatment Notes Wound #1 (Lower Leg) Wound Laterality: Anterior Cleanser Soap and Water Discharge Instruction: May shower and wash wound with dial antibacterial soap and water prior to dressing change. Peri-Wound Care Sween Lotion (Moisturizing lotion) Discharge Instruction: Apply moisturizing lotion as  directed Topical Mupirocin Ointment Discharge Instruction: Apply Mupirocin (Bactroban) as instructed Primary Dressing KerraCel Ag Gelling Fiber Dressing, 4x5 in (silver alginate) Discharge Instruction: Apply silver alginate to wound bed as instructed Secondary Dressing Zetuvit Plus Silicone Border Dressing 4x4 (in/in) Discharge Instruction: Apply silicone border over primary dressing as directed. Secured With [employer] Surgical T 2x10 (in/yd) ape Discharge Instruction: Secure with tape as directed. Compression Wrap ThreePress (3 layer compression wrap) Discharge Instruction: Apply three layer compression as directed. Unnaboot w/Calamine, 4x10 (in/yd) Discharge Instruction: Apply Unnaboot as directed. Compression Stockings Add-Ons Electronic Signature(s) Signed: 03/21/2022 10:37:40 AM By: Sandre Kitty Entered By: Sandre Kitty on 03/21/2022 09:02:10 -------------------------------------------------------------------------------- Vitals Details Patient Name: Date of Service: [name] RD O. 03/21/2022 8:45 A M Medical Record Number: [id number] Patient Account Number: [id number] Date of Birth/Sex: Treating RN: April 10, 1956 (66 y.o. [name] Primary Care Daniell Paradise: [name] Other Clinician: Referring Tariya Morrissette: Treating Kasson Lamere/Extender: [name] in Treatment: 3 Vital Signs Time Taken: 08:57 Temperature (F): 97.9 Height (in): 76 Pulse (bpm): 67 Weight (lbs): 322 Respiratory Rate (breaths/min): 18 Body Mass Index (BMI): 39.2 Blood Pressure (mmHg): 136/88 Reference Range: 80 - 120 mg / dl Electronic Signature(s) Signed: 03/21/2022 5:03:05 PM By: Blanche East RN Entered By: Blanche East on 03/21/2022 08:58:13

## 2022-03-21 NOTE — Progress Notes (Signed)
[name], [name] Visit Report for 03/21/2022 Chief Complaint Document Details Patient Name: Date of Service: Clinton Liner RD O. 03/21/2022 8:45 A M Medical Record Number: [id number] Patient Account Number: [id number] Date of Birth/Sex: Treating RN: 09/02/1955 (66 y.o. M) Primary Care Provider: [name] Other Clinician: Referring Provider: Treating Provider/Extender: [name] in Treatment: 3 Information Obtained from: Patient Chief Complaint Patient presents for treatment of an open ulcer due to venous insufficiency Electronic Signature(s) Signed: 03/21/2022 9:18:06 AM By: Fredirick Maudlin MD FACS Entered By: Fredirick Maudlin on 03/21/2022 09:18:06 -------------------------------------------------------------------------------- Debridement Details Patient Name: Date of Service: Clinton Liner RD O. 03/21/2022 8:45 A M Medical Record Number: [id number] Patient Account Number: [id number] Date of Birth/Sex: Treating RN: 10/08/1955 (66 y.o. [name] Primary Care Provider: [name] Other Clinician: Referring Provider: Treating Provider/Extender: [name] in Treatment: 3 Debridement Performed for Assessment: Wound #1 Anterior Lower Leg Performed By: Physician Fredirick Maudlin, MD Debridement Type: Debridement Severity of Tissue Pre Debridement: Fat layer exposed Level of Consciousness (Pre-procedure): Awake and Alert Pre-procedure Verification/Time Out Yes - 09:07 Taken: Start Time: 09:08 Pain Control: Lidocaine 5% topical ointment T Area Debrided (L x W): otal 1 (cm) x 1 (cm) = 1 (cm) Tissue and other material debrided: Non-Viable, Eschar, Slough, Slough Level: Non-Viable Tissue Debridement Description: Selective/Open Wound Instrument: Curette Bleeding: Minimum Hemostasis Achieved: Pressure Procedural Pain: 0 Post Procedural Pain: 0 Response to Treatment: Procedure was tolerated  well Level of Consciousness (Post- Awake and Alert procedure): Post Debridement Measurements of Total Wound Length: (cm) 0.5 Width: (cm) 0.5 Depth: (cm) 0.1 Volume: (cm) 0.02 Character of Wound/Ulcer Post Debridement: Improved Severity of Tissue Post Debridement: Fat layer exposed Post Procedure Diagnosis Same as Pre-procedure Notes Scribed for Dr. Celine Ahr by Blanche East, RN Electronic Signature(s) Signed: 03/21/2022 10:03:44 AM By: Fredirick Maudlin MD FACS Signed: 03/21/2022 5:03:05 PM By: Blanche East RN Entered By: Blanche East on 03/21/2022 09:09:34 -------------------------------------------------------------------------------- HPI Details Patient Name: Date of Service: Clinton Liner RD O. 03/21/2022 8:45 A M Medical Record Number: [id number] Patient Account Number: [id number] Date of Birth/Sex: Treating RN: Jul 24, 1955 (66 y.o. M) Primary Care Provider: [name] Other Clinician: Referring Provider: Treating Provider/Extender: [name] in Treatment: 3 History of Present Illness HPI Description: ADMISSION 02/24/2022 This is a 66 year old man with a past medical history significant for congestive heart failure, morbid obesity, dilated cardiomyopathy, atrial fibrillation, and lower extremity cellulitis. He is currently being treated with Keflex. He is not diabetic. He does not smoke. He says that he developed some blisters on his left anterior tibial surface which subsequently broke open causing the wounds for which she is here to see [place] today. He says that he occasionally wears compression stockings but does not do so on a regular basis. He reports that "they look stupid with shorts." ABI in clinic today was 0.94. On the left anterior tibial surface, there are several small wounds in a geographic pattern. They do appear consistent with blisters that have ruptured. The fat layer is exposed and there is a thin layer of slough on the surfaces. He  does have some surrounding erythema, but no purulent drainage. Edema control is very poor. 03/03/2022: The patient came in for a nurse visit last week because he thought the compression wraps were too tight. They were reapplied and apparently he did not understand the instruction to try to stay off his feet is much as possible and keep  his leg elevated; he instead walked excessively on both Friday and Saturday. As result his leg became more swollen, the wrap became uncomfortable and he cut it off yesterday. He replaced it with a wrap of his own fashion. Today he has an indentation in his leg where his homemade wrap ended. He has extensive 3+ nonpitting edema above this. The wounds themselves are in fairly decent condition with just a little bit of slough on the surface, but edema fluid is frankly pouring out of the largest wound. 03/14/2022: Significant improvement in his wound. He has come in a couple of times for nurse visits due to drainage. Today the wound is quite a bit smaller and just has a layer of eschar and slough on the surface. Edema control is better. 03/23/2022: Ongoing improvement in his wound. His edema control is suboptimal today, however, because his wrap slid down. There is also evidence of excoriation suggesting that perhaps he was scratching under the wrap and it slid. There is a little bit of eschar and slough on the wound surface. Electronic Signature(s) Signed: 03/21/2022 9:19:06 AM By: Fredirick Maudlin MD FACS Entered By: Fredirick Maudlin on 03/21/2022 09:19:06 -------------------------------------------------------------------------------- Physical Exam Details Patient Name: Date of Service: Clinton Liner RD O. 03/21/2022 8:45 A M Medical Record Number: [id number] Patient Account Number: [id number] Date of Birth/Sex: Treating RN: 02-Mar-1956 (66 y.o. M) Primary Care Provider: [name] Other Clinician: Referring Provider: Treating Provider/Extender: [name] in Treatment: 3 Constitutional . . . . No acute distress.Marland Kitchen Respiratory Normal work of breathing on room air.. Notes 03/23/2022: Ongoing improvement in his wound. His edema control is suboptimal today, however, because his wrap slid down. There is also evidence of excoriation suggesting that perhaps he was scratching under the wrap and it slid. There is a little bit of eschar and slough on the wound surface. Electronic Signature(s) Signed: 03/21/2022 9:22:08 AM By: Fredirick Maudlin MD FACS Entered By: Fredirick Maudlin on 03/21/2022 09:22:08 -------------------------------------------------------------------------------- Physician Orders Details Patient Name: Date of Service: Clinton Liner RD O. 03/21/2022 8:45 A M Medical Record Number: [id number] Patient Account Number: [id number] Date of Birth/Sex: Treating RN: [birth year] (66 y.o. [name] Primary Care Provider: [name] Other Clinician: Referring Provider: Treating Provider/Extender: [name] in Treatment: 3 Verbal / Phone Orders: No Diagnosis Coding ICD-10 Coding Code Description [phone] Non-pressure chronic ulcer of other part of left lower leg with fat layer exposed R60.0 Localized edema E32.12 Chronic systolic (congestive) heart failure I42.0 Dilated cardiomyopathy E66.01 Morbid (severe) obesity due to excess calories I10 Essential (primary) hypertension Follow-up Appointments ppointment in 1 week. - Dr. Celine Ahr Rm 4 Return A Friday 9/22 at 8:00am Nurse Visit: - Room 2 Monday 9/18 at 11:30 Wednesday 9/20 at 11:30 Anesthetic Wound #1 Anterior Lower Leg (In clinic) Topical Lidocaine 5% applied to wound bed - in clinic, prior to debridement Bathing/ Shower/ Hygiene May shower with protection but do not get wound dressing(s) wet. - use cast protector on Left lower leg Edema Control - Lymphedema / SCD / Other Avoid standing for long periods of  time. Patient to wear own compression stockings every day. - wear compression stockings Right leg Wound Treatment Wound #1 - Lower Leg Wound Laterality: Anterior Cleanser: Soap and Water Discharge Instructions: May shower and wash wound with dial antibacterial soap and water prior to dressing change. Peri-Wound Care: Sween Lotion (Moisturizing lotion) Discharge Instructions: Apply moisturizing lotion as directed Topical: Mupirocin Ointment Discharge Instructions: Apply  Mupirocin (Bactroban) as instructed Prim Dressing: KerraCel Ag Gelling Fiber Dressing, 4x5 in (silver alginate) ary Discharge Instructions: Apply silver alginate to wound bed as instructed Secondary Dressing: Zetuvit Plus Silicone Border Dressing 4x4 (in/in) Discharge Instructions: Apply silicone border over primary dressing as directed. Secured With: 48M Medipore Public affairs consultant Surgical T 2x10 (in/yd) ape Discharge Instructions: Secure with tape as directed. Compression Wrap: ThreePress (3 layer compression wrap) Discharge Instructions: Apply three layer compression as directed. Compression Wrap: Unnaboot w/Calamine, 4x10 (in/yd) Discharge Instructions: Apply Unnaboot as directed. Electronic Signature(s) Signed: 03/21/2022 10:03:44 AM By: Fredirick Maudlin MD FACS Entered By: Fredirick Maudlin on 03/21/2022 00:17:49 -------------------------------------------------------------------------------- Problem List Details Patient Name: Date of Service: Clinton Liner RD O. 03/21/2022 8:45 A M Medical Record Number: [id number] Patient Account Number: [id number] Date of Birth/Sex: Treating RN: August 01, 1955 (66 y.o. M) Primary Care Provider: [name] Other Clinician: Referring Provider: Treating Provider/Extender: [name] in Treatment: 3 Active Problems ICD-10 Encounter Code Description Active Date MDM Diagnosis [phone] Non-pressure chronic ulcer of other part of left lower leg with fat  layer exposed 02/24/2022 No Yes R60.0 Localized edema 02/24/2022 No Yes L93.57 Chronic systolic (congestive) heart failure 02/24/2022 No Yes I42.0 Dilated cardiomyopathy 02/24/2022 No Yes E66.01 Morbid (severe) obesity due to excess calories 02/24/2022 No Yes I10 Essential (primary) hypertension 02/24/2022 No Yes Inactive Problems Resolved Problems Electronic Signature(s) Signed: 03/21/2022 9:17:51 AM By: Fredirick Maudlin MD FACS Entered By: Fredirick Maudlin on 03/21/2022 09:17:50 -------------------------------------------------------------------------------- Progress Note Details Patient Name: Date of Service: Clinton Liner RD O. 03/21/2022 8:45 A M Medical Record Number: [id number] Patient Account Number: [id number] Date of Birth/Sex: Treating RN: 01-14-1956 (66 y.o. M) Primary Care Provider: [name] Other Clinician: Referring Provider: Treating Provider/Extender: [name] in Treatment: 3 Subjective Chief Complaint Information obtained from Patient Patient presents for treatment of an open ulcer due to venous insufficiency History of Present Illness (HPI) ADMISSION 02/24/2022 This is a 66 year old man with a past medical history significant for congestive heart failure, morbid obesity, dilated cardiomyopathy, atrial fibrillation, and lower extremity cellulitis. He is currently being treated with Keflex. He is not diabetic. He does not smoke. He says that he developed some blisters on his left anterior tibial surface which subsequently broke open causing the wounds for which she is here to see [place] today. He says that he occasionally wears compression stockings but does not do so on a regular basis. He reports that "they look stupid with shorts." ABI in clinic today was 0.94. On the left anterior tibial surface, there are several small wounds in a geographic pattern. They do appear consistent with blisters that have ruptured. The fat layer is exposed and  there is a thin layer of slough on the surfaces. He does have some surrounding erythema, but no purulent drainage. Edema control is very poor. 03/03/2022: The patient came in for a nurse visit last week because he thought the compression wraps were too tight. They were reapplied and apparently he did not understand the instruction to try to stay off his feet is much as possible and keep his leg elevated; he instead walked excessively on both Friday and Saturday. As result his leg became more swollen, the wrap became uncomfortable and he cut it off yesterday. He replaced it with a wrap of his own fashion. Today he has an indentation in his leg where his homemade wrap ended. He has extensive 3+ nonpitting edema above this. The wounds themselves are in fairly  decent condition with just a little bit of slough on the surface, but edema fluid is frankly pouring out of the largest wound. 03/14/2022: Significant improvement in his wound. He has come in a couple of times for nurse visits due to drainage. Today the wound is quite a bit smaller and just has a layer of eschar and slough on the surface. Edema control is better. 03/23/2022: Ongoing improvement in his wound. His edema control is suboptimal today, however, because his wrap slid down. There is also evidence of excoriation suggesting that perhaps he was scratching under the wrap and it slid. There is a little bit of eschar and slough on the wound surface. Patient History Information obtained from Patient. Family History Cancer - Father,Mother, Hypertension - Mother,Father, No family history of Diabetes, Heart Disease, Hereditary Spherocytosis, Kidney Disease, Lung Disease, Seizures, Thyroid Problems, Tuberculosis. 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 Medical History Eyes Patient has history of Cataracts - 2021 Ear/Nose/Mouth/Throat Denies history of Chronic  sinus problems/congestion, Middle ear problems Hematologic/Lymphatic Patient has history of Lymphedema Respiratory Patient has history of Sleep Apnea Cardiovascular Patient has history of Congestive Heart Failure, Coronary Artery Disease, Hypertension Denies history of Deep Vein Thrombosis Gastrointestinal Denies history of Cirrhosis , Colitis, Crohnoos Endocrine Denies history of Type I Diabetes, Type II Diabetes Genitourinary Denies history of End Stage Renal Disease Immunological Denies history of Raynaudoos, Scleroderma Integumentary (Skin) Denies history of History of Burn Musculoskeletal Denies history of Gout, Rheumatoid Arthritis, Osteoarthritis, Osteomyelitis Neurologic Denies history of Dementia, Neuropathy, Quadriplegia, Paraplegia, Seizure Disorder Oncologic Denies history of Received Chemotherapy, Received Radiation Psychiatric Patient has history of Confinement Anxiety Denies history of Anorexia/bulimia Hospitalization/Surgery History - Inguinal Hernia repair- 2014. - insertion of mesh-2014. - Atrial ablation surgery- 2007. - BIla eyes lasik- 2008. - hernia repair- 1977. Medical A Surgical History Notes nd Respiratory Allergic Rhinitis Cardiovascular AFIB Objective Constitutional No acute distress.. Vitals Time Taken: 8:57 AM, Height: 76 in, Weight: 322 lbs, BMI: 39.2, Temperature: 97.9 F, Pulse: 67 bpm, Respiratory Rate: 18 breaths/min, Blood Pressure: 136/88 mmHg. Respiratory Normal work of breathing on room air.. General Notes: 03/23/2022: Ongoing improvement in his wound. His edema control is suboptimal today, however, because his wrap slid down. There is also evidence of excoriation suggesting that perhaps he was scratching under the wrap and it slid. There is a little bit of eschar and slough on the wound surface. Integumentary (Hair, Skin) Wound #1 status is Open. Original cause of wound was Blister. The date acquired was: 09/25/2021. The wound has  been in treatment 3 weeks. The wound is located on the Anterior Lower Leg. The wound measures 0.5cm length x 0.5cm width x 0.1cm depth; 0.196cm^2 area and 0.02cm^3 volume. There is Fat Layer (Subcutaneous Tissue) exposed. There is a large amount of serous drainage noted. The wound margin is flat and intact. There is medium (34-66%) red, pink granulation within the wound bed. There is a medium (34-66%) amount of necrotic tissue within the wound bed including Adherent Slough. Assessment Active Problems ICD-10 Non-pressure chronic ulcer of other part of left lower leg with fat layer exposed Localized edema Chronic systolic (congestive) heart failure Dilated cardiomyopathy Morbid (severe) obesity due to excess calories Essential (primary) hypertension Procedures Wound #1 Pre-procedure diagnosis of Wound #1 is a Venous Leg Ulcer located on the Anterior Lower Leg .Severity of Tissue Pre Debridement is: Fat layer exposed. There was a Selective/Open  Wound Non-Viable Tissue Debridement with a total area of 1 sq cm performed by Fredirick Maudlin, MD. With the following instrument(s): Curette to remove Non-Viable tissue/material. Material removed includes Eschar and Slough and after achieving pain control using Lidocaine 5% topical ointment. No specimens were taken. A time out was conducted at 09:07, prior to the start of the procedure. A Minimum amount of bleeding was controlled with Pressure. The procedure was tolerated well with a pain level of 0 throughout and a pain level of 0 following the procedure. Post Debridement Measurements: 0.5cm length x 0.5cm width x 0.1cm depth; 0.02cm^3 volume. Character of Wound/Ulcer Post Debridement is improved. Severity of Tissue Post Debridement is: Fat layer exposed. Post procedure Diagnosis Wound #1: Same as Pre-Procedure General Notes: Scribed for Dr. Celine Ahr by Blanche East, RN. Pre-procedure diagnosis of Wound #1 is a Venous Leg Ulcer located on the Anterior  Lower Leg . There was a Three Layer Compression Therapy Procedure by Blanche East, RN. Post procedure Diagnosis Wound #1: Same as Pre-Procedure Plan Follow-up Appointments: Return Appointment in 1 week. - Dr. Celine Ahr Rm 4 Friday 9/22 at 8:00am Nurse Visit: - Room 2 Monday 9/18 at 11:30 Wednesday 9/20 at 11:30 Anesthetic: Wound #1 Anterior Lower Leg: (In clinic) Topical Lidocaine 5% applied to wound bed - in clinic, prior to debridement Bathing/ Shower/ Hygiene: May shower with protection but do not get wound dressing(s) wet. - use cast protector on Left lower leg Edema Control - Lymphedema / SCD / Other: Avoid standing for long periods of time. Patient to wear own compression stockings every day. - wear compression stockings Right leg WOUND #1: - Lower Leg Wound Laterality: Anterior Cleanser: Soap and Water Discharge Instructions: May shower and wash wound with dial antibacterial soap and water prior to dressing change. Peri-Wound Care: Sween Lotion (Moisturizing lotion) Discharge Instructions: Apply moisturizing lotion as directed Topical: Mupirocin Ointment Discharge Instructions: Apply Mupirocin (Bactroban) as instructed Prim Dressing: KerraCel Ag Gelling Fiber Dressing, 4x5 in (silver alginate) ary Discharge Instructions: Apply silver alginate to wound bed as instructed Secondary Dressing: Zetuvit Plus Silicone Border Dressing 4x4 (in/in) Discharge Instructions: Apply silicone border over primary dressing as directed. Secured With: 41M Medipore Public affairs consultant Surgical T 2x10 (in/yd) ape Discharge Instructions: Secure with tape as directed. Com pression Wrap: ThreePress (3 layer compression wrap) Discharge Instructions: Apply three layer compression as directed. Com pression Wrap: Unnaboot w/Calamine, 4x10 (in/yd) Discharge Instructions: Apply Unnaboot as directed. 03/23/2022: Ongoing improvement in his wound. His edema control is suboptimal today, however, because his wrap slid down.  There is also evidence of excoriation suggesting that perhaps he was scratching under the wrap and it slid. There is a little bit of eschar and slough on the wound surface. I debrided slough and eschar from the wound. We will continue topical mupirocin with silver alginate. We are going to use the first layer of an Unna boot to try and help with the itching and also minimize wrap slippage. He will have a nurse visit next week and then see me a week from today. Electronic Signature(s) Signed: 03/21/2022 9:22:58 AM By: Fredirick Maudlin MD FACS Entered By: Fredirick Maudlin on 03/21/2022 09:22:58 -------------------------------------------------------------------------------- HxROS Details Patient Name: Date of Service: Clinton Liner RD O. 03/21/2022 8:45 A M Medical Record Number: [id number] Patient Account Number: [id number] Date of Birth/Sex: Treating RN: 09-Apr-1956 (66 y.o. M) Primary Care Provider: [name] Other Clinician: Referring Provider: Treating Provider/Extender: [name] in Treatment: 3 Information Obtained From Patient Eyes  Medical History: Positive for: Cataracts - 2021 Ear/Nose/Mouth/Throat Medical History: Negative for: Chronic sinus problems/congestion; Middle ear problems Hematologic/Lymphatic Medical History: Positive for: Lymphedema Respiratory Medical History: Positive for: Sleep Apnea Past Medical History Notes: Allergic Rhinitis Cardiovascular Medical History: Positive for: Congestive Heart Failure; Coronary Artery Disease; Hypertension Negative for: Deep Vein Thrombosis Past Medical History Notes: AFIB Gastrointestinal Medical History: Negative for: Cirrhosis ; Colitis; Crohns Endocrine Medical History: Negative for: Type I Diabetes; Type II Diabetes Genitourinary Medical History: Negative for: End Stage Renal Disease Immunological Medical History: Negative for: Raynauds; Scleroderma Integumentary  (Skin) Medical History: Negative for: History of Burn Musculoskeletal Medical History: Negative for: Gout; Rheumatoid Arthritis; Osteoarthritis; Osteomyelitis Neurologic Medical History: Negative for: Dementia; Neuropathy; Quadriplegia; Paraplegia; Seizure Disorder Oncologic Medical History: Negative for: Received Chemotherapy; Received Radiation Psychiatric Medical History: Positive for: Confinement Anxiety Negative for: Anorexia/bulimia HBO Extended History Items Eyes: Cataracts Immunizations Pneumococcal Vaccine: Received Pneumococcal Vaccination: Yes Received Pneumococcal Vaccination On or After 60th Birthday: Yes Implantable Devices None Hospitalization / Surgery History Type of Hospitalization/Surgery Inguinal Hernia repair- 2014 insertion of mesh-2014 Atrial ablation surgery- 2007 BIla eyes lasik- 2008 hernia repair- 1977 Family and Social History Cancer: Yes - Father,Mother; Diabetes: No; Heart Disease: No; Hereditary Spherocytosis: No; Hypertension: Yes - Mother,Father; Kidney Disease: No; Lung Disease: No; Seizures: No; Thyroid Problems: No; Tuberculosis: No; Never smoker; Marital Status - Married; Alcohol Use: Never; Drug Use: No History; Caffeine Use: Daily - [employer] dew; Financial Concerns: No; Food, Clothing or Shelter Needs: No; Transportation Concerns: No Electronic Signature(s) Signed: 03/21/2022 10:03:44 AM By: Fredirick Maudlin MD FACS Entered By: Fredirick Maudlin on 03/21/2022 09:20:45 -------------------------------------------------------------------------------- SuperBill Details Patient Name: Date of Service: Clinton Liner RD O. 03/21/2022 Medical Record Number: [id number] Patient Account Number: [id number] Date of Birth/Sex: Treating RN: 1955/12/20 (66 y.o. M) Primary Care Provider: [name] Other Clinician: Referring Provider: Treating Provider/Extender: [name] in Treatment: 3 Diagnosis Coding ICD-10  Codes Code Description [phone] Non-pressure chronic ulcer of other part of left lower leg with fat layer exposed R60.0 Localized edema Z48.27 Chronic systolic (congestive) heart failure I42.0 Dilated cardiomyopathy E66.01 Morbid (severe) obesity due to excess calories I10 Essential (primary) hypertension Facility Procedures CPT4 Code: [id number] Description: [phone] - DEBRIDE WOUND 1ST 20 SQ CM OR < ICD-10 Diagnosis Description 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 Modifier: d Quantity: 1 Physician Procedures : CPT4 Code Description Modifier [id number] - WC PHYS LEVEL 4 - EST PT 25 ICD-10 Diagnosis Description 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d R60.0 Localized edema P49.82 Chronic systolic (congestive)  heart failure E66.01 Morbid (severe) obesity due to excess calories Quantity: 1 : [id number] - WC PHYS DEBR WO ANESTH 20 SQ CM ICD-10 Diagnosis Description 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d Quantity: 1 Electronic Signature(s) Signed: 03/21/2022 9:23:15 AM By: Fredirick Maudlin MD FACS Entered By: Fredirick Maudlin on 03/21/2022 09:23:15

## 2022-03-24 ENCOUNTER — Encounter (HOSPITAL_BASED_OUTPATIENT_CLINIC_OR_DEPARTMENT_OTHER): Payer: 59 | Admitting: General Surgery

## 2022-03-24 DIAGNOSIS — L97822 Non-pressure chronic ulcer of other part of left lower leg with fat layer exposed: Secondary | ICD-10-CM | POA: Diagnosis not present

## 2022-03-24 NOTE — Progress Notes (Signed)
AQUILA, DELAUGHTER ([id number]) Visit Report for 03/24/2022 Arrival Information Details Patient Name: Date of Service: [name] RD O. 03/24/2022 11:30 A M Medical Record Number: [id number] Patient Account Number: [id number] Date of Birth/Sex: Treating RN: 12/07/1955 (66 y.o. [name] Primary Care Wassim Kirksey: [name] Other Clinician: Referring Morenike Cuff: Treating Kiamesha Samet/Extender: [name] in Treatment: 4 Visit Information History Since Last Visit Added or deleted any medications: No Patient Arrived: Ambulatory Any new allergies or adverse reactions: No Arrival Time: 11:23 Had a fall or experienced change in No Accompanied By: self activities of daily living that may affect Transfer Assistance: None risk of falls: Patient Identification Verified: Yes Signs or symptoms of abuse/neglect since last visito No Secondary Verification Process Completed: Yes Hospitalized since last visit: No Patient Requires Transmission-Based Precautions: No Implantable device outside of the clinic excluding No Patient Has Alerts: No cellular tissue based products placed in the center since last visit: Has Dressing in Place as Prescribed: Yes Has Compression in Place as Prescribed: Yes Pain Present Now: Yes Electronic Signature(s) Signed: 03/24/2022 5:25:08 PM By: Adline Peals Entered By: Adline Peals on 03/24/2022 11:24:20 -------------------------------------------------------------------------------- Compression Therapy Details Patient Name: Date of Service: [name] RD O. 03/24/2022 11:30 A M Medical Record Number: [id number] Patient Account Number: [id number] Date of Birth/Sex: Treating RN: [birth year] (66 y.o. [name] Primary Care [name]: [name] Other Clinician: Referring [name]: Treating [name]/Extender: [name] in Treatment: 4 Compression Therapy Performed for  Wound Assessment: Wound #1 Anterior Lower Leg Performed By: Clinician Adline Peals, RN Compression Type: Three Layer Electronic Signature(s) Signed: 03/24/2022 5:25:08 PM By: Adline Peals Entered By: Adline Peals on 03/24/2022 12:01:04 -------------------------------------------------------------------------------- Encounter Discharge Information Details Patient Name: Date of Service: [name] RD O. 03/24/2022 11:30 A M Medical Record Number: [id number] Patient Account Number: [id number] Date of Birth/Sex: Treating RN: [birth year] (66 y.o. [name] Primary Care [name]: [name] Other Clinician: Referring Pearson Picou: Treating Shamina Etheridge/Extender: [name] in Treatment: 4 Encounter Discharge Information Items Discharge Condition: Stable Ambulatory Status: Ambulatory Discharge Destination: Home Transportation: Private Auto Accompanied By: self Schedule Follow-up Appointment: Yes Clinical Summary of Care: Patient Declined Electronic Signature(s) Signed: 03/24/2022 5:25:08 PM By: [name] By: Adline Peals on 03/24/2022 12:01:58 -------------------------------------------------------------------------------- Patient/Caregiver Education Details Patient Name: Date of Service: [name] RD O. 9/18/2023andnbsp11:30 [name] Number: [id number] Patient Account Number: [id number] Date of Birth/Gender: Treating RN: May 16, 1956 (66 y.o. [name] Primary Care Physician: [name] Other Clinician: Referring Physician: Treating Physician/Extender: [name] in Treatment: 4 Education Assessment Education Provided To: Patient Education Topics Provided Wound/Skin Impairment: Methods: Explain/Verbal Responses: Reinforcements needed, State content correctly Electronic Signature(s) Signed: 03/24/2022 5:25:08 PM By: Adline Peals Entered  By: Adline Peals on 03/24/2022 12:01:21 -------------------------------------------------------------------------------- Wound Assessment Details Patient Name: Date of Service: [name] RD O. 03/24/2022 11:30 A M Medical Record Number: [id number] Patient Account Number: [id number] Date of Birth/Sex: Treating RN: [birth year] (66 y.o. [name] Primary Care [name]: [name] Other Clinician: Referring [name]: Treating [name]/Extender: [name] in Treatment: 4 Wound Status Wound Number: 1 Primary Venous Leg Ulcer Etiology: Wound Location: Anterior Lower Leg Wound Open Wounding Event: Blister Wounding Event: Blister Status: Date Acquired: 09/25/2021 Comorbid Cataracts, Lymphedema, Sleep Apnea, Congestive Heart Failure, Weeks Of Treatment: 4 History: Coronary Artery Disease, Hypertension, Confinement Anxiety Clustered Wound: Yes Wound Measurements Length: (cm) 0.5 Width: (  cm) 0.5 Depth: (cm) 0.1 Area: (cm) 0.196 Volume: (cm) 0.02 % Reduction in Area: 96.1% % Reduction in Volume: 96% Epithelialization: Large (67-100%) Wound Description Classification: Full Thickness Without Exposed Support Structures Wound Margin: Flat and Intact Exudate Amount: Large Exudate Type: Serous Exudate Color: amber Foul Odor After Cleansing: No Slough/Fibrino Yes Wound Bed Granulation Amount: Medium (34-66%) Exposed Structure Granulation Quality: Red, Pink Fascia Exposed: No Necrotic Amount: Medium (34-66%) Fat Layer (Subcutaneous Tissue) Exposed: Yes Necrotic Quality: Adherent Slough Tendon Exposed: No Muscle Exposed: No Joint Exposed: No Bone Exposed: No Treatment Notes Wound #1 (Lower Leg) Wound Laterality: Anterior Cleanser Soap and Water Discharge Instruction: May shower and wash wound with dial antibacterial soap and water prior to dressing change. Peri-Wound Care Sween Lotion (Moisturizing lotion) Discharge  Instruction: Apply moisturizing lotion as directed Topical Mupirocin Ointment Discharge Instruction: Apply Mupirocin (Bactroban) as instructed Primary Dressing KerraCel Ag Gelling Fiber Dressing, 4x5 in (silver alginate) Discharge Instruction: Apply silver alginate to wound bed as instructed Secondary Dressing Zetuvit Plus Silicone Border Dressing 4x4 (in/in) Discharge Instruction: Apply silicone border over primary dressing as directed. Secured With [employer] Surgical T 2x10 (in/yd) ape Discharge Instruction: Secure with tape as directed. Compression Wrap ThreePress (3 layer compression wrap) Discharge Instruction: Apply three layer compression as directed. Unnaboot w/Calamine, 4x10 (in/yd) Discharge Instruction: Apply Unnaboot as directed. Compression Stockings Add-Ons Electronic Signature(s) Signed: 03/24/2022 5:25:08 PM By: Adline Peals Entered By: Adline Peals on 03/24/2022 11:24:45 -------------------------------------------------------------------------------- Vitals Details Patient Name: Date of Service: [name] RD O. 03/24/2022 11:30 A M Medical Record Number: [id number] Patient Account Number: [id number] Date of Birth/Sex: Treating RN: 1955/10/06 (66 y.o. [name] Primary Care Lealand Elting: [name] Other Clinician: Referring Anica Alcaraz: Treating Naveed Humphres/Extender: [name] in Treatment: 4 Vital Signs Time Taken: 11:24 Temperature (F): 97.6 Height (in): 76 Pulse (bpm): 76 Weight (lbs): 322 Respiratory Rate (breaths/min): 18 Body Mass Index (BMI): 39.2 Blood Pressure (mmHg): 150/98 Reference Range: 80 - 120 mg / dl Electronic Signature(s) Signed: 03/24/2022 5:25:08 PM By: Adline Peals Entered By: Adline Peals on 03/24/2022 11:24:33

## 2022-03-24 NOTE — Progress Notes (Signed)
SRIYAN, CUTTING ([id number]) Visit Report for 03/24/2022 SuperBill Details Patient Name: Date of Service: [facility] RD O. 03/24/2022 Medical Record Number: [id number] Patient Account Number: [id number] Date of Birth/Sex: Treating RN: 1955/07/09 (66 y.o. [name] Primary Care Provider: [name] Other Clinician: Referring Provider: Treating Provider/Extender: [name] in Treatment: 4 Diagnosis Coding ICD-10 Codes Code Description [id number] Non-pressure chronic ulcer of other part of left lower leg with fat layer exposed R60.0 Localized edema J17.91 Chronic systolic (congestive) heart failure I42.0 Dilated cardiomyopathy E66.01 Morbid (severe) obesity due to excess calories I10 Essential (primary) hypertension Facility Procedures CPT4 Code Description Modifier Quantity [id number] (Facility Use Only) [id number] - APPLY MULTLAY COMPRS LWR LT LEG 1 Electronic Signature(s) Signed: 03/24/2022 12:49:39 PM By: Fredirick Maudlin MD FACS Signed: 03/24/2022 5:25:08 PM By: Adline Peals Entered By: Adline Peals on 03/24/2022 12:02:09

## 2022-03-26 ENCOUNTER — Ambulatory Visit: Payer: 59 | Admitting: Cardiology

## 2022-03-26 ENCOUNTER — Encounter (HOSPITAL_BASED_OUTPATIENT_CLINIC_OR_DEPARTMENT_OTHER): Payer: 59 | Admitting: General Surgery

## 2022-03-26 ENCOUNTER — Encounter: Payer: Self-pay | Admitting: Cardiology

## 2022-03-26 VITALS — BP 148/91 | HR 95 | Ht 76.0 in | Wt 326.0 lb

## 2022-03-26 DIAGNOSIS — I10 Essential (primary) hypertension: Secondary | ICD-10-CM | POA: Diagnosis not present

## 2022-03-26 DIAGNOSIS — I4821 Permanent atrial fibrillation: Secondary | ICD-10-CM | POA: Diagnosis not present

## 2022-03-26 DIAGNOSIS — I5032 Chronic diastolic (congestive) heart failure: Secondary | ICD-10-CM

## 2022-03-26 DIAGNOSIS — I340 Nonrheumatic mitral (valve) insufficiency: Secondary | ICD-10-CM | POA: Diagnosis not present

## 2022-03-26 DIAGNOSIS — L97822 Non-pressure chronic ulcer of other part of left lower leg with fat layer exposed: Secondary | ICD-10-CM | POA: Diagnosis not present

## 2022-03-26 MED ORDER — HYDRALAZINE HCL 100 MG PO TABS: 100.0000 mg | ORAL_TABLET | Freq: Three times a day (TID) | ORAL | 3 refills | Status: DC

## 2022-03-26 NOTE — Patient Instructions (Signed)
Medication Instructions:   INCREASE HYDRALAZINE TO 100 MG THREE TIMES DAILY  TAKE 60 MG OF DEMADEX TWICE DAILY AND TAKE AN EXTRA 20 MG AS NEEDED  *If you need a refill on your cardiac medications before your next appointment, please call your pharmacy*  Testing/Procedures:  Your physician has requested that you have an echocardiogram. Echocardiography is a painless test that uses sound waves to create images of your heart. It provides your doctor with information about the size and shape of your heart and how well your heart's chambers and valves are working. This procedure takes approximately one hour. There are no restrictions for this procedure. [place] STUDY   Follow-Up: At [facility], you and your health needs are our priority.  As part of our continuing mission to provide you with exceptional heart care, we have created designated Provider Care Teams.  These Care Teams include your primary Cardiologist (physician) and Advanced Practice Providers (APPs -  Physician Assistants and Nurse Practitioners) who all work together to provide you with the care you need, when you need it.  We recommend signing up for the patient portal called "MyChart".  Sign up information is provided on this After Visit Summary.  MyChart is used to connect with patients for Virtual Visits (Telemedicine).  Patients are able to view lab/test results, encounter notes, upcoming appointments, etc.  Non-urgent messages can be sent to your provider as well.   To learn more about what you can do with MyChart, go to [email or web address].    Your next appointment:   3 month(s)  The format for your next appointment:   In Person  Provider:   Kirk Ruths MD

## 2022-03-26 NOTE — Progress Notes (Signed)
CORDARRIUS, COAD ([id number]) Visit Report for 03/26/2022 SuperBill Details Patient Name: Date of Service: [facility] RD O. 03/26/2022 Medical Record Number: [id number] Patient Account Number: [id number] Date of Birth/Sex: Treating RN: [birth year] (66 y.o. [name] Primary Care Provider: [name] Other Clinician: Referring Provider: Treating Provider/Extender: [name] in Treatment: 4 Diagnosis Coding ICD-10 Codes Code Description [phone] Non-pressure chronic ulcer of other part of left lower leg with fat layer exposed R60.0 Localized edema W41.32 Chronic systolic (congestive) heart failure I42.0 Dilated cardiomyopathy E66.01 Morbid (severe) obesity due to excess calories I10 Essential (primary) hypertension Facility Procedures CPT4 Code Description Modifier Quantity [id number] (Facility Use Only) [id number] - [place] 1 Electronic Signature(s) Signed: 03/26/2022 12:13:04 PM By: Fredirick Maudlin MD FACS Signed: 03/26/2022 5:15:08 PM By: Blanche East RN Entered By: Blanche East on 03/26/2022 11:34:26

## 2022-03-26 NOTE — Progress Notes (Signed)
DUSTON, SMOLENSKI ([id number]) Visit Report for 03/26/2022 Arrival Information Details Patient Name: Date of Service: Clinton Liner RD O. 03/26/2022 11:30 A M Medical Record Number: [id number] Patient Account Number: [id number] Date of Birth/Sex: Treating RN: Nov 19, 1955 (66 y.o. [name] [name]: [name] Other Clinician: Referring [name]: Treating [name]/Extender: [name] in Treatment: 4 Visit Information History Since Last Visit All ordered tests and consults were completed: Yes Patient Arrived: Ambulatory Added or deleted any medications: No Arrival Time: 11:10 Any new allergies or adverse reactions: No Accompanied By: self Had a fall or experienced change in No Transfer Assistance: None activities of daily living that may affect Patient Requires Transmission-Based Precautions: No risk of falls: Patient Has Alerts: No Signs or symptoms of abuse/neglect since last visito No Hospitalized since last visit: No Implantable device outside of the clinic excluding No cellular tissue based products placed in the center since last visit: Has Dressing in Place as Prescribed: Yes Has Compression in Place as Prescribed: Yes Pain Present Now: Yes Electronic Signature(s) Signed: 03/26/2022 5:15:08 PM By: Clinton East RN Entered By: [name] on 03/26/2022 11:10:39 -------------------------------------------------------------------------------- Compression Therapy Details Patient Name: Date of Service: Clinton Liner RD O. 03/26/2022 11:30 A M Medical Record Number: [id number] Patient Account Number: [id number] Date of Birth/Sex: Treating RN: 1956-06-06 (66 y.o. [name] [name]: [name] Other Clinician: Referring [name]: Treating [name]/Extender: [name] in Treatment: 4 Compression Therapy Performed for Wound Assessment: Wound #1 Anterior Lower  Leg Performed By: Clinician Clinton Peals, RN Compression Type: Rolena Infante Electronic Signature(s) Signed: 03/26/2022 5:15:08 PM By: Clinton East RN Entered By: [name] on 03/26/2022 11:33:29 -------------------------------------------------------------------------------- Encounter Discharge Information Details Patient Name: Date of Service: Clinton Liner RD O. 03/26/2022 11:30 A M Medical Record Number: [id number] Patient Account Number: [id number] Date of Birth/Sex: Treating RN: 04-Apr-1956 (66 y.o. [name] [name]: [name] Other Clinician: Referring [name]: Treating [name]: [name] in Treatment: 4 Encounter Discharge Information Items Discharge Condition: Stable Ambulatory Status: Ambulatory Discharge Destination: Home Transportation: Private Auto Accompanied By: self Schedule Follow-up Appointment: Yes Clinical Summary of [name]: Patient Declined Electronic Signature(s) Signed: 03/26/2022 5:15:08 PM By: Clinton East RN Entered By: [name] on 03/26/2022 11:34:10 -------------------------------------------------------------------------------- Patient/Caregiver Education Details Patient Name: Date of Service: [name] Record Number: [id number] Patient Account Number: [id number] Date of Birth/Gender: Treating RN: 12-27-1955 (66 y.o. [name] Physician: [name] Other Clinician: Referring Physician: Treating Physician/Extender: [name] in Treatment: 4 Education Assessment Education Provided To: Patient Education Topics Provided Wound/Skin Impairment: Methods: Explain/Verbal Responses: Reinforcements needed, State content correctly Electronic Signature(s) Signed: 03/26/2022 5:15:08 PM By: Clinton East RN Entered By: [name] on 03/26/2022  11:34:01 -------------------------------------------------------------------------------- Wound Assessment Details Patient Name: Date of Service: Clinton Liner RD O. 03/26/2022 11:30 A M Medical Record Number: [id number] Patient Account Number: [id number] Date of Birth/Sex: Treating RN: [birth year] (66 y.o. [name] [name]: [name] Other Clinician: Referring [name]: Treating [name]/Extender: [name] in Treatment: 4 Wound Status Wound Number: 1 Primary Venous Leg Ulcer Etiology: Wound Location: Anterior Lower Leg Wound Open Wounding Event: Blister Status: Date Acquired: 09/25/2021 Comorbid Cataracts, Lymphedema, Sleep Apnea, Congestive Heart Failure, Weeks Of Treatment: 4 History: Coronary Artery Disease, Hypertension, Confinement Anxiety Clustered Wound: Yes Wound Measurements Length: (cm) 0.5 Width: (  cm) 0.5 Depth: (cm) 0.1 Area: (cm) 0.196 Volume: (cm) 0.02 % Reduction in Area: 96.1% % Reduction in Volume: 96% Epithelialization: Large (67-100%) Wound Description Classification: Full Thickness Without Exposed Support Structures Wound Margin: Flat and Intact Exudate Amount: Large Exudate Type: Serous Exudate Color: amber Foul Odor After Cleansing: No Slough/Fibrino Yes Wound Bed Granulation Amount: Medium (34-66%) Exposed Structure Granulation Quality: Red, Pink Fascia Exposed: No Necrotic Amount: Medium (34-66%) Fat Layer (Subcutaneous Tissue) Exposed: Yes Necrotic Quality: Adherent Slough Tendon Exposed: No Muscle Exposed: No Joint Exposed: No Bone Exposed: No Treatment Notes Wound #1 (Lower Leg) Wound Laterality: Anterior Cleanser Soap and Water Discharge Instruction: May shower and wash wound with dial antibacterial soap and water prior to dressing change. Peri-Wound [name] Triamcinolone 15 (g) Discharge Instruction: Use triamcinolone 15 (g) as directed Sween Lotion (Moisturizing  lotion) Discharge Instruction: Apply moisturizing lotion as directed Topical Primary Dressing KerraCel Ag Gelling Fiber Dressing, 4x5 in (silver alginate) Discharge Instruction: Apply silver alginate to wound bed as instructed Secondary Dressing ABD Pad, 5x9 Discharge Instruction: Apply over primary dressing as directed. Secured With [employer] Surgical T [name] (in/yd) ape Discharge Instruction: Secure with tape as directed. Compression Wrap Unnaboot w/Calamine, 4x10 (in/yd) Discharge Instruction: Apply Unnaboot as directed. Compression Stockings Add-Ons Electronic Signature(s) Signed: 03/26/2022 5:15:08 PM By: Clinton East RN Entered By: [name] on 03/26/2022 11:33:15 -------------------------------------------------------------------------------- Vitals Details Patient Name: Date of Service: Clinton Liner RD O. 03/26/2022 11:30 A M Medical Record Number: [id number] Patient Account Number: [id number] Date of Birth/Sex: Treating RN: 11-07-1955 (66 y.o. [name] Aritha Huckeba: [name] Other Clinician: Referring Joeseph Verville: Treating Calle Schader/Extender: [name] in Treatment: 4 Vital Signs Time Taken: 11:10 Temperature (F): 97.7 Height (in): 76 Pulse (bpm): 73 Weight (lbs): 322 Respiratory Rate (breaths/min): 18 Body Mass Index (BMI): 39.2 Blood Pressure (mmHg): 157/99 Reference Range: 80 - 120 mg / dl Electronic Signature(s) Signed: 03/26/2022 5:15:08 PM By: Clinton East RN Entered By: [name] on 03/26/2022 11:12:46

## 2022-03-27 ENCOUNTER — Telehealth: Payer: Self-pay | Admitting: *Deleted

## 2022-03-27 ENCOUNTER — Telehealth: Payer: Self-pay

## 2022-03-27 NOTE — Telephone Encounter (Signed)
I called the patient to speak with him about the Itamar device, unable to LVM. I called the patient wife and she stated she would return my call after her meeting.

## 2022-03-27 NOTE — Telephone Encounter (Signed)
Staff message sent to [name] ok to activate itamar deivce.  

## 2022-03-28 ENCOUNTER — Encounter (HOSPITAL_BASED_OUTPATIENT_CLINIC_OR_DEPARTMENT_OTHER): Payer: 59 | Admitting: General Surgery

## 2022-03-28 DIAGNOSIS — L97822 Non-pressure chronic ulcer of other part of left lower leg with fat layer exposed: Secondary | ICD-10-CM | POA: Diagnosis not present

## 2022-03-28 NOTE — Progress Notes (Signed)
REAL, CONA ([id number]) Visit Report for 03/28/2022 Arrival Information Details Patient Name: Date of Service: [name] 03/28/2022 8:00 A M Medical Record Number: [id number] Patient Account Number: [id number] Date of Birth/Sex: Treating RN: 06-26-1956 (67 y.o. [name] Primary Care Tenoch Mcclure: [name] Other Clinician: Referring Solace Manwarren: Treating Mattheo Swindle/Extender: [name] in Treatment: 4 Visit Information History Since Last Visit All ordered tests and consults were completed: Yes Patient Arrived: Ambulatory Added or deleted any medications: No Arrival Time: 07:41 Signs or symptoms of abuse/neglect since last visito No Accompanied By: self Hospitalized since last visit: No Transfer Assistance: None Implantable device outside of the clinic excluding No Patient Identification Verified: Yes cellular tissue based products placed in the center Secondary Verification Process Completed: Yes since last visit: Patient Requires Transmission-Based Precautions: No Has Compression in Place as Prescribed: Yes Patient Has Alerts: No Pain Present Now: Yes Electronic Signature(s) Signed: 03/28/2022 4:59:13 PM By: Blanche East RN Entered By: Blanche East on 03/28/2022 07:42:24 -------------------------------------------------------------------------------- Compression Therapy Details Patient Name: Date of Service: [place], [place]. 03/28/2022 8:00 A M Medical Record Number: [id number] Patient Account Number: [id number] Date of Birth/Sex: Treating RN: 08/02/1955 (66 y.o. [name] Primary Care Stefhanie Kachmar: [name] Other Clinician: Referring Saiya Crist: Treating Justus Duerr/Extender: [name] in Treatment: 4 Compression Therapy Performed for Wound Assessment: Wound #1 Anterior Lower Leg Performed By: Clinician Blanche East, RN Compression Type: Three Layer Post Procedure Diagnosis Same as  Pre-procedure Electronic Signature(s) Signed: 03/28/2022 4:59:13 PM By: Blanche East RN Entered By: Blanche East on 03/28/2022 08:27:12 -------------------------------------------------------------------------------- Encounter Discharge Information Details Patient Name: Date of Service: [name] 03/28/2022 8:00 A M Medical Record Number: [id number] Patient Account Number: [id number] Date of Birth/Sex: Treating RN: June 06, 1956 (66 y.o. [name] Primary Care Britanny Marksberry: [name] Other Clinician: Referring Bexton Haak: Treating Truth Barot/Extender: [name] in Treatment: 4 Encounter Discharge Information Items Post Procedure Vitals Discharge Condition: Stable Temperature (F): 97.6 Ambulatory Status: Ambulatory Pulse (bpm): 66 Discharge Destination: Home Respiratory Rate (breaths/min): 20 Transportation: Private Auto Blood Pressure (mmHg): 152/96 Accompanied By: self Schedule Follow-up Appointment: Yes Clinical Summary of Care: Electronic Signature(s) Signed: 03/28/2022 4:59:13 PM By: Blanche East RN Entered By: Blanche East on 03/28/2022 08:27:46 -------------------------------------------------------------------------------- Lower Extremity Assessment Details Patient Name: Date of Service: [name] 03/28/2022 8:00 A M Medical Record Number: [id number] Patient Account Number: [id number] Date of Birth/Sex: Treating RN: 02/28/1956 (66 y.o. [name] Primary Care Jersie Beel: [name] Other Clinician: Referring Tekla Malachowski: Treating Lova Urbieta/Extender: [name] in Treatment: 4 Edema Assessment Assessed: [name]: No] [Right: No] E[Left: dema] [Right: :] Calf Left: Right: Point of Measurement: 38 cm From Medial Instep 49 cm Ankle Left: Right: Point of Measurement: 11 cm From Medial Instep 30 cm Vascular Assessment Pulses: Dorsalis Pedis Palpable: [Left:Yes] Electronic  Signature(s) Signed: 03/28/2022 4:59:13 PM By: Blanche East RN Entered By: Blanche East on 03/28/2022 07:49:28 -------------------------------------------------------------------------------- Multi Wound Chart Details Patient Name: Date of Service: [name] 03/28/2022 8:00 A M Medical Record Number: [id number] Patient Account Number: [id number] Date of Birth/Sex: Treating RN: 05-23-1956 (66 y.o. M) Primary Care Ezekial Arns: [name] Other Clinician: Referring Shai Rasmussen: Treating Taelyn Nemes/Extender: [name] in Treatment: 4 Vital Signs Height(in): 76 Pulse(bpm): 22 Weight(lbs): 322 Blood Pressure(mmHg): 152/96 Body Mass Index(BMI): 39.2 Temperature(F): 97.6 Respiratory Rate(breaths/min): 20 Photos: [N/A:N/A] Anterior Lower Leg N/A N/A Wound  Location: Blister N/A N/A Wounding Event: Venous Leg Ulcer N/A N/A Primary Etiology: Cataracts, Lymphedema, Sleep N/A N/A Comorbid History: Apnea, Congestive Heart Failure, Coronary Artery Disease, Hypertension, Confinement Anxiety 09/25/2021 N/A N/A Date Acquired: 4 N/A N/A Weeks of Treatment: Open N/A N/A Wound Status: No N/A N/A Wound Recurrence: Yes N/A N/A Clustered Wound: 8.5x10x0.1 N/A N/A Measurements L x W x D (cm) 66.759 N/A N/A A (cm) : rea 6.676 N/A N/A Volume (cm) : -1228.00% N/A N/A % Reduction in A rea: -1227.20% N/A N/A % Reduction in Volume: Full Thickness Without Exposed N/A N/A Classification: Support Structures Large N/A N/A Exudate A mount: Serous N/A N/A Exudate Type: amber N/A N/A Exudate Color: Flat and Intact N/A N/A Wound Margin: Medium (34-66%) N/A N/A Granulation A mount: Red, Pink N/A N/A Granulation Quality: Medium (34-66%) N/A N/A Necrotic A mount: Fat Layer (Subcutaneous Tissue): Yes N/A N/A Exposed Structures: Fascia: No Tendon: No Muscle: No Joint: No Bone: No Large (67-100%) N/A N/A Epithelialization: Debridement -  Selective/Open Wound N/A N/A Debridement: Pre-procedure Verification/Time Out 08:02 N/A N/A Taken: Lidocaine 4% Topical Solution N/A N/A Pain Control: Slough N/A N/A Tissue Debrided: Non-Viable Tissue N/A N/A Level: 1 N/A N/A Debridement A (sq cm): rea Curette N/A N/A Instrument: None N/A N/A Bleeding: 0 N/A N/A Procedural Pain: 0 N/A N/A Post Procedural Pain: Procedure was tolerated well N/A N/A Debridement Treatment Response: 8.5x10x0.1 N/A N/A Post Debridement Measurements L x W x D (cm) 6.676 N/A N/A Post Debridement Volume: (cm) Debridement N/A N/A Procedures Performed: Treatment Notes Electronic Signature(s) Signed: 03/28/2022 8:24:48 AM By: Fredirick Maudlin MD FACS Entered By: Fredirick Maudlin on 03/28/2022 08:24:48 -------------------------------------------------------------------------------- Multi-Disciplinary Care Plan Details Patient Name: Date of Service: [name] 03/28/2022 8:00 A M Medical Record Number: [id number] Patient Account Number: [id number] Date of Birth/Sex: Treating RN: 05-04-1956 (66 y.o. [name] Primary Care Bert Ptacek: [name] Other Clinician: Referring Lindbergh Winkles: Treating Blondie Riggsbee/Extender: [name] in Treatment: 4 Active Inactive Venous Leg Ulcer Nursing Diagnoses: Potential for venous Insuffiency (use before diagnosis confirmed) Goals: Patient will maintain optimal edema control Date Initiated: 02/24/2022 Target Resolution Date: 03/31/2022 Goal Status: Active Interventions: Assess peripheral edema status every visit. Compression as ordered Notes: Wound/Skin Impairment Nursing Diagnoses: Impaired tissue integrity Goals: Ulcer/skin breakdown will have a volume reduction of 30% by week 4 Date Initiated: 02/24/2022 Target Resolution Date: 03/31/2022 Goal Status: Active Interventions: Assess patient/caregiver ability to perform ulcer/skin care regimen upon admission and  as needed Treatment Activities: Topical wound management initiated : 02/24/2022 Notes: Electronic Signature(s) Signed: 03/28/2022 4:59:13 PM By: Blanche East RN Entered By: Blanche East on 03/28/2022 07:53:37 -------------------------------------------------------------------------------- Pain Assessment Details Patient Name: Date of Service: [name] 03/28/2022 8:00 A M Medical Record Number: [id number] Patient Account Number: [id number] Date of Birth/Sex: Treating RN: 1955/12/14 (66 y.o. [name] Primary Care Makahla Kiser: [name] Other Clinician: Referring Shenouda Genova: Treating Tycho Cheramie/Extender: [name] in Treatment: 4 Active Problems Location of Pain Severity and Description of Pain Patient Has Paino No Site Locations Pain Management and Medication Current Pain Management: Electronic Signature(s) Signed: 03/28/2022 4:59:13 PM By: Blanche East RN Entered By: Blanche East on 03/28/2022 07:43:21 -------------------------------------------------------------------------------- Patient/Caregiver Education Details Patient Name: Date of Service: Romero Liner RD Jenetta Downer 9/22/2023andnbsp8:00 [place] Record Number: [id number] Patient Account Number: [id number] Date of Birth/Gender: Treating RN: 10-03-1955 (66 y.o. [name] Primary Care Physician: [name] Other Clinician: Referring Physician: Treating Physician/Extender:  Janeal Holmes Weeks in Treatment: 4 Education Assessment Education Provided To: Patient Education Topics Provided Engineer, maintenance) Signed: 03/28/2022 4:59:13 PM By: Blanche East RN Entered By: Blanche East on 03/28/2022 07:53:45 -------------------------------------------------------------------------------- Wound Assessment Details Patient Name: Date of Service: [name] 03/28/2022 8:00 A M Medical Record Number: [id number] Patient Account Number:  [id number] Date of Birth/Sex: Treating RN: [birth year] (66 y.o. [name] Primary Care Burhanuddin Kohlmann: [name] Other Clinician: Referring Beulah Capobianco: Treating Gifford Ballon/Extender: [name] in Treatment: 4 Wound Status Wound Number: 1 Primary Venous Leg Ulcer Etiology: Wound Location: Anterior Lower Leg Wound Open Wounding Event: Blister Status: Date Acquired: 09/25/2021 Comorbid Cataracts, Lymphedema, Sleep Apnea, Congestive Heart Failure, Weeks Of Treatment: 4 History: Coronary Artery Disease, Hypertension, Confinement Anxiety Clustered Wound: Yes Photos Wound Measurements Length: (cm) 8.5 Width: (cm) 10 Depth: (cm) 0.1 Area: (cm) 66.759 Volume: (cm) 6.676 % Reduction in Area: -1228% % Reduction in Volume: -1227.2% Epithelialization: Large (67-100%) Tunneling: No Undermining: No Wound Description Classification: Full Thickness Without Exposed Support Structures Wound Margin: Flat and Intact Exudate Amount: Large Exudate Type: Serous Exudate Color: amber Foul Odor After Cleansing: No Slough/Fibrino Yes Wound Bed Granulation Amount: Medium (34-66%) Exposed Structure Granulation Quality: Red, Pink Fascia Exposed: No Necrotic Amount: Medium (34-66%) Fat Layer (Subcutaneous Tissue) Exposed: Yes Necrotic Quality: Adherent Slough Tendon Exposed: No Muscle Exposed: No Joint Exposed: No Bone Exposed: No Treatment Notes Wound #1 (Lower Leg) Wound Laterality: Anterior Cleanser Soap and Water Discharge Instruction: May shower and wash wound with dial antibacterial soap and water prior to dressing change. Peri-Wound Care Sween Lotion (Moisturizing lotion) Discharge Instruction: Apply moisturizing lotion as directed Topical Triamcinolone Discharge Instruction: Apply Triamcinolone as directed Primary Dressing KerraCel Ag Gelling Fiber Dressing, 4x5 in (silver alginate) Discharge Instruction: Apply silver alginate to wound bed as  instructed Secondary Dressing Zetuvit Plus Silicone Border Dressing 4x4 (in/in) Discharge Instruction: Apply silicone border over primary dressing as directed. Secured With [employer] Surgical T 2x10 (in/yd) ape Discharge Instruction: Secure with tape as directed. Compression Wrap ThreePress (3 layer compression wrap) Discharge Instruction: Apply three layer compression as directed. Unnaboot w/Calamine, 4x10 (in/yd) Discharge Instruction: Apply Unnaboot as directed. Compression Stockings Add-Ons Electronic Signature(s) Signed: 03/28/2022 4:59:13 PM By: Blanche East RN Entered By: Blanche East on 03/28/2022 07:52:48 -------------------------------------------------------------------------------- Vitals Details Patient Name: Date of Service: [name] 03/28/2022 8:00 A M Medical Record Number: [id number] Patient Account Number: [id number] Date of Birth/Sex: Treating RN: [birth year] (66 y.o. [name] Primary Care Argie Lober: [name] Other Clinician: Referring Cabrini Ruggieri: Treating Claxton Levitz/Extender: [name] in Treatment: 4 Vital Signs Time Taken: 07:42 Temperature (F): 97.6 Height (in): 76 Pulse (bpm): 66 Weight (lbs): 322 Respiratory Rate (breaths/min): 20 Body Mass Index (BMI): 39.2 Blood Pressure (mmHg): 152/96 Reference Range: 80 - 120 mg / dl Electronic Signature(s) Signed: 03/28/2022 4:59:13 PM By: Blanche East RN Entered By: Blanche East on 03/28/2022 07:42:46

## 2022-03-28 NOTE — Progress Notes (Signed)
[name], [name] ([id number]) Visit Report for 03/28/2022 Chief Complaint Document Details Patient Name: Date of Service: [name] RD O. 03/28/2022 8:00 A M Medical Record Number: [id number] Patient Account Number: [id number] Date of Birth/Sex: Treating RN: 03-19-1956 (66 y.o. M) Primary Care Provider: [name] Other Clinician: Referring Provider: Treating Provider/Extender: [name] in Treatment: 4 Information Obtained from: Patient Chief Complaint Patient presents for treatment of an open ulcer due to venous insufficiency Electronic Signature(s) Signed: 03/28/2022 8:24:53 AM By: Fredirick Maudlin MD FACS Entered By: Fredirick Maudlin on 03/28/2022 08:24:53 -------------------------------------------------------------------------------- Debridement Details Patient Name: Date of Service: [name] RD O. 03/28/2022 8:00 A M Medical Record Number: [id number] Patient Account Number: [id number] Date of Birth/Sex: Treating RN: 01/12/1956 (66 y.o. [name] Primary Care Provider: [name] Other Clinician: Referring Provider: Treating Provider/Extender: [name] in Treatment: 4 Debridement Performed for Assessment: Wound #1 Anterior Lower Leg Performed By: Physician Fredirick Maudlin, MD Debridement Type: Debridement Severity of Tissue Pre Debridement: Fat layer exposed Level of Consciousness (Pre-procedure): Awake and Alert Pre-procedure Verification/Time Out Yes - 08:02 Taken: Start Time: 08:03 Pain Control: Lidocaine 5% topical ointment T Area Debrided (L x W): otal 1 (cm) x 1 (cm) = 1 (cm) Tissue and other material debrided: [place] Level: Non-Viable Tissue Debridement Description: Selective/Open Wound Instrument: Curette Bleeding: None Procedural Pain: 0 Post Procedural Pain: 0 Response to Treatment: Procedure was tolerated well Level of Consciousness (Post- Awake and  Alert procedure): Post Debridement Measurements of Total Wound Length: (cm) 8.5 Width: (cm) 10 Depth: (cm) 0.1 Volume: (cm) 6.676 Character of Wound/Ulcer Post Debridement: Requires Further Debridement Severity of Tissue Post Debridement: Fat layer exposed Post Procedure Diagnosis Same as Pre-procedure Notes Scribed for Dr. Celine Ahr by Jarvis Morgan Electronic Signature(s) Signed: 03/28/2022 8:53:44 AM By: Fredirick Maudlin MD FACS Signed: 03/28/2022 4:59:13 PM By: Blanche East RN Entered By: Blanche East on 03/28/2022 08:26:35 -------------------------------------------------------------------------------- HPI Details Patient Name: Date of Service: [name] RD O. 03/28/2022 8:00 A M Medical Record Number: [id number] Patient Account Number: [id number] Date of Birth/Sex: Treating RN: 1955-07-21 (66 y.o. M) Primary Care Provider: [name] Other Clinician: Referring Provider: Treating Provider/Extender: [name] in Treatment: 4 History of Present Illness HPI Description: ADMISSION 02/24/2022 This is a 66 year old man with a past medical history significant for congestive heart failure, morbid obesity, dilated cardiomyopathy, atrial fibrillation, and lower extremity cellulitis. He is currently being treated with Keflex. He is not diabetic. He does not smoke. He says that he developed some blisters on his left anterior tibial surface which subsequently broke open causing the wounds for which she is here to see [place] today. He says that he occasionally wears compression stockings but does not do so on a regular basis. He reports that "they look stupid with shorts." ABI in clinic today was 0.94. On the left anterior tibial surface, there are several small wounds in a geographic pattern. They do appear consistent with blisters that have ruptured. The fat layer is exposed and there is a thin layer of slough on the surfaces. He does have some surrounding  erythema, but no purulent drainage. Edema control is very poor. 03/03/2022: The patient came in for a nurse visit last week because he thought the compression wraps were too tight. They were reapplied and apparently he did not understand the instruction to try to stay off his feet is much as possible and keep his leg elevated; he  instead walked excessively on both Friday and Saturday. As result his leg became more swollen, the wrap became uncomfortable and he cut it off yesterday. He replaced it with a wrap of his own fashion. Today he has an indentation in his leg where his homemade wrap ended. He has extensive 3+ nonpitting edema above this. The wounds themselves are in fairly decent condition with just a little bit of slough on the surface, but edema fluid is frankly pouring out of the largest wound. 03/14/2022: Significant improvement in his wound. He has come in a couple of times for nurse visits due to drainage. Today the wound is quite a bit smaller and just has a layer of eschar and slough on the surface. Edema control is better. 03/23/2022: Ongoing improvement in his wound. His edema control is suboptimal today, however, because his wrap slid down. There is also evidence of excoriation suggesting that perhaps he was scratching under the wrap and it slid. There is a little bit of eschar and slough on the wound surface. 03/28/2022: The patient has been fairly noncompliant with his compression wraps. As result, he has opened a new area of wounding on his anterior tibial surface just proximal to the original site. It is limited breakdown of skin. The original site is smaller and has a little slough buildup. He did see his cardiologist this week and is now taking torsemide twice a day. Electronic Signature(s) Signed: 03/28/2022 8:26:12 AM By: Fredirick Maudlin MD FACS Entered By: Fredirick Maudlin on 03/28/2022  08:26:12 -------------------------------------------------------------------------------- Physical Exam Details Patient Name: Date of Service: [name] RD O. 03/28/2022 8:00 A M Medical Record Number: [id number] Patient Account Number: [id number] Date of Birth/Sex: Treating RN: 1956-05-01 (66 y.o. M) Primary Care Provider: [name] Other Clinician: Referring Provider: Treating Provider/Extender: [name] in Treatment: 4 Constitutional Hypertensive, asymptomatic. . . . No acute distress.Marland Kitchen Respiratory Normal work of breathing on room air.. Notes 03/28/2022: The patient has been fairly noncompliant with his compression wraps. As result, he has opened a new area of wounding on his anterior tibial surface just proximal to the original site. It is limited breakdown of skin. The original site is smaller and has a little slough buildup. Electronic Signature(s) Signed: 03/28/2022 8:29:15 AM By: Fredirick Maudlin MD FACS Entered By: Fredirick Maudlin on 03/28/2022 08:29:14 -------------------------------------------------------------------------------- Physician Orders Details Patient Name: Date of Service: [name] RD O. 03/28/2022 8:00 A M Medical Record Number: [id number] Patient Account Number: [id number] Date of Birth/Sex: Treating RN: 1956-01-23 (66 y.o. [name] Primary Care Provider: [name] Other Clinician: Referring Provider: Treating Provider/Extender: [name] in Treatment: 4 Verbal / Phone Orders: No Diagnosis Coding ICD-10 Coding Code Description [phone] Non-pressure chronic ulcer of other part of left lower leg with fat layer exposed R60.0 Localized edema Y58.59 Chronic systolic (congestive) heart failure I42.0 Dilated cardiomyopathy E66.01 Morbid (severe) obesity due to excess calories I10 Essential (primary) hypertension L97.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 other part of left  lower leg limited to breakdown of skin Follow-up Appointments ppointment in 1 week. - Dr. Celine Ahr Rm 4 Return A Friday Nurse Visit: - Room Monday 9/25 at 7:30 am Wednesday 9/27 at 7:30 am Anesthetic Wound #1 Anterior Lower Leg (In clinic) Topical Lidocaine 5% applied to wound bed - in clinic, prior to debridement Bathing/ Shower/ Hygiene May shower with protection but do not get wound dressing(s) wet. - use cast protector on Left lower leg Edema Control - Lymphedema /  SCD / Other Avoid standing for long periods of time. Patient to wear own compression stockings every day. - wear compression stockings Right leg Wound Treatment Wound #1 - Lower Leg Wound Laterality: Anterior Cleanser: Soap and Water Discharge Instructions: May shower and wash wound with dial antibacterial soap and water prior to dressing change. Peri-Wound Care: Sween Lotion (Moisturizing lotion) Discharge Instructions: Apply moisturizing lotion as directed Topical: Triamcinolone Discharge Instructions: Apply Triamcinolone as directed Prim Dressing: KerraCel Ag Gelling Fiber Dressing, 4x5 in (silver alginate) ary Discharge Instructions: Apply silver alginate to wound bed as instructed Secondary Dressing: Zetuvit Plus Silicone Border Dressing 4x4 (in/in) Discharge Instructions: Apply silicone border over primary dressing as directed. Secured With: 48M Medipore Public affairs consultant Surgical T 2x10 (in/yd) ape Discharge Instructions: Secure with tape as directed. Compression Wrap: ThreePress (3 layer compression wrap) Discharge Instructions: Apply three layer compression as directed. Compression Wrap: Unnaboot w/Calamine, 4x10 (in/yd) Discharge Instructions: Apply Unnaboot as directed. Electronic Signature(s) Signed: 03/28/2022 8:53:44 AM By: Fredirick Maudlin MD FACS Entered By: Fredirick Maudlin on 03/28/2022 08:29:29 -------------------------------------------------------------------------------- Problem List Details Patient  Name: Date of Service: [name] RD O. 03/28/2022 8:00 A M Medical Record Number: [id number] Patient Account Number: [id number] Date of Birth/Sex: Treating RN: [birth year] (66 y.o. M) Primary Care Provider: [name] Other Clinician: Referring Provider: Treating Provider/Extender: [name] in Treatment: 4 Active Problems ICD-10 Encounter Code Description Active Date MDM Diagnosis [phone] Non-pressure chronic ulcer of other part of left lower leg with fat layer exposed 02/24/2022 No Yes R60.0 Localized edema 02/24/2022 No Yes H47.42 Chronic systolic (congestive) heart failure 02/24/2022 No Yes I42.0 Dilated cardiomyopathy 02/24/2022 No Yes E66.01 Morbid (severe) obesity due to excess calories 02/24/2022 No Yes I10 Essential (primary) hypertension 02/24/2022 No Yes L97.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 other part of left lower leg limited to breakdown 03/28/2022 No Yes of skin Inactive Problems Resolved Problems Electronic Signature(s) Signed: 03/28/2022 8:24:41 AM By: Fredirick Maudlin MD FACS Entered By: Fredirick Maudlin on 03/28/2022 08:24:40 -------------------------------------------------------------------------------- Progress Note Details Patient Name: Date of Service: [name] RD O. 03/28/2022 8:00 A M Medical Record Number: [id number] Patient Account Number: [id number] Date of Birth/Sex: Treating RN: 06-16-1956 (66 y.o. M) Primary Care Provider: [name] Other Clinician: Referring Provider: Treating Provider/Extender: [name] in Treatment: 4 Subjective Chief Complaint Information obtained from Patient Patient presents for treatment of an open ulcer due to venous insufficiency History of Present Illness (HPI) ADMISSION 02/24/2022 This is a 66 year old man with a past medical history significant for congestive heart failure, morbid obesity, dilated cardiomyopathy, atrial fibrillation, and  lower extremity cellulitis. He is currently being treated with Keflex. He is not diabetic. He does not smoke. He says that he developed some blisters on his left anterior tibial surface which subsequently broke open causing the wounds for which she is here to see [place] today. He says that he occasionally wears compression stockings but does not do so on a regular basis. He reports that "they look stupid with shorts." ABI in clinic today was 0.94. On the left anterior tibial surface, there are several small wounds in a geographic pattern. They do appear consistent with blisters that have ruptured. The fat layer is exposed and there is a thin layer of slough on the surfaces. He does have some surrounding erythema, but no purulent drainage. Edema control is very poor. 03/03/2022: The patient came in for a nurse visit last week because he thought the compression wraps were too  tight. They were reapplied and apparently he did not understand the instruction to try to stay off his feet is much as possible and keep his leg elevated; he instead walked excessively on both Friday and Saturday. As result his leg became more swollen, the wrap became uncomfortable and he cut it off yesterday. He replaced it with a wrap of his own fashion. Today he has an indentation in his leg where his homemade wrap ended. He has extensive 3+ nonpitting edema above this. The wounds themselves are in fairly decent condition with just a little bit of slough on the surface, but edema fluid is frankly pouring out of the largest wound. 03/14/2022: Significant improvement in his wound. He has come in a couple of times for nurse visits due to drainage. Today the wound is quite a bit smaller and just has a layer of eschar and slough on the surface. Edema control is better. 03/23/2022: Ongoing improvement in his wound. His edema control is suboptimal today, however, because his wrap slid down. There is also evidence of excoriation suggesting that  perhaps he was scratching under the wrap and it slid. There is a little bit of eschar and slough on the wound surface. 03/28/2022: The patient has been fairly noncompliant with his compression wraps. As result, he has opened a new area of wounding on his anterior tibial surface just proximal to the original site. It is limited breakdown of skin. The original site is smaller and has a little slough buildup. He did see his cardiologist this week and is now taking torsemide twice a day. Patient History Information obtained from Patient. Family History Cancer - Father,Mother, Hypertension - Mother,Father, No family history of Diabetes, Heart Disease, Hereditary Spherocytosis, Kidney Disease, Lung Disease, Seizures, Thyroid Problems, Tuberculosis. 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 Medical History Eyes Patient has history of Cataracts - 2021 Ear/Nose/Mouth/Throat Denies history of Chronic sinus problems/congestion, Middle ear problems Hematologic/Lymphatic Patient has history of Lymphedema Respiratory Patient has history of Sleep Apnea Cardiovascular Patient has history of Congestive Heart Failure, Coronary Artery Disease, Hypertension Denies history of Deep Vein Thrombosis Gastrointestinal Denies history of Cirrhosis , Colitis, Crohnoos Endocrine Denies history of Type I Diabetes, Type II Diabetes Genitourinary Denies history of End Stage Renal Disease Immunological Denies history of Raynaudoos, Scleroderma Integumentary (Skin) Denies history of History of Burn Musculoskeletal Denies history of Gout, Rheumatoid Arthritis, Osteoarthritis, Osteomyelitis Neurologic Denies history of Dementia, Neuropathy, Quadriplegia, Paraplegia, Seizure Disorder Oncologic Denies history of Received Chemotherapy, Received Radiation Psychiatric Patient has history of Confinement Anxiety Denies history of  Anorexia/bulimia Hospitalization/Surgery History - Inguinal Hernia repair- 2014. - insertion of mesh-2014. - Atrial ablation surgery- 2007. - BIla eyes lasik- 2008. - hernia repair- 1977. Medical A Surgical History Notes nd Respiratory Allergic Rhinitis Cardiovascular AFIB Objective Constitutional Hypertensive, asymptomatic. No acute distress.. Vitals Time Taken: 7:42 AM, Height: 76 in, Weight: 322 lbs, BMI: 39.2, Temperature: 97.6 F, Pulse: 66 bpm, Respiratory Rate: 20 breaths/min, Blood Pressure: 152/96 mmHg. Respiratory Normal work of breathing on room air.. General Notes: 03/28/2022: The patient has been fairly noncompliant with his compression wraps. As result, he has opened a new area of wounding on his anterior tibial surface just proximal to the original site. It is limited breakdown of skin. The original site is smaller and has a little slough buildup. Integumentary (Hair, Skin) Wound #1 status is Open. Original cause of wound was Blister.  The date acquired was: 09/25/2021. The wound has been in treatment 4 weeks. The wound is located on the Anterior Lower Leg. The wound measures 8.5cm length x 10cm width x 0.1cm depth; 66.759cm^2 area and 6.676cm^3 volume. There is Fat Layer (Subcutaneous Tissue) exposed. There is no tunneling or undermining noted. There is a large amount of serous drainage noted. The wound margin is flat and intact. There is medium (34-66%) red, pink granulation within the wound bed. There is a medium (34-66%) amount of necrotic tissue within the wound bed including Adherent Slough. Assessment Active Problems ICD-10 Non-pressure chronic ulcer of other part of left lower leg with fat layer exposed Localized edema Chronic systolic (congestive) heart failure Dilated cardiomyopathy Morbid (severe) obesity due to excess calories Essential (primary) hypertension Non-pressure chronic ulcer of other part of left lower leg limited to breakdown of  skin Procedures Wound #1 Pre-procedure diagnosis of Wound #1 is a Venous Leg Ulcer located on the Anterior Lower Leg .Severity of Tissue Pre Debridement is: Fat layer exposed. There was a Selective/Open Wound Non-Viable Tissue Debridement with a total area of 1 sq cm performed by Fredirick Maudlin, MD. With the following instrument(s): Curette Material removed includes [facility] after achieving pain control using Lidocaine 5% topical ointment. No specimens were taken. A time out was conducted at 08:02, prior to the start of the procedure. There was no bleeding. The procedure was tolerated well with a pain level of 0 throughout and a pain level of 0 following the procedure. Post Debridement Measurements: 8.5cm length x 10cm width x 0.1cm depth; 6.676cm^3 volume. Character of Wound/Ulcer Post Debridement requires further debridement. Severity of Tissue Post Debridement is: Fat layer exposed. Post procedure Diagnosis Wound #1: Same as Pre-Procedure General Notes: Scribed for Dr. Celine Ahr by Jarvis Morgan. Pre-procedure diagnosis of Wound #1 is a Venous Leg Ulcer located on the Anterior Lower Leg . There was a Three Layer Compression Therapy Procedure by Blanche East, RN. Post procedure Diagnosis Wound #1: Same as Pre-Procedure Plan Follow-up Appointments: Return Appointment in 1 week. - Dr. Celine Ahr Rm 4 Friday Nurse Visit: - Room Monday 9/25 at 7:30 am Wednesday 9/27 at 7:30 am Anesthetic: Wound #1 Anterior Lower Leg: (In clinic) Topical Lidocaine 5% applied to wound bed - in clinic, prior to debridement Bathing/ Shower/ Hygiene: May shower with protection but do not get wound dressing(s) wet. - use cast protector on Left lower leg Edema Control - Lymphedema / SCD / Other: Avoid standing for long periods of time. Patient to wear own compression stockings every day. - wear compression stockings Right leg WOUND #1: - Lower Leg Wound Laterality: Anterior Cleanser: Soap and Water Discharge  Instructions: May shower and wash wound with dial antibacterial soap and water prior to dressing change. Peri-Wound Care: Sween Lotion (Moisturizing lotion) Discharge Instructions: Apply moisturizing lotion as directed Topical: Triamcinolone Discharge Instructions: Apply Triamcinolone as directed Prim Dressing: KerraCel Ag Gelling Fiber Dressing, 4x5 in (silver alginate) ary Discharge Instructions: Apply silver alginate to wound bed as instructed Secondary Dressing: Zetuvit Plus Silicone Border Dressing 4x4 (in/in) Discharge Instructions: Apply silicone border over primary dressing as directed. Secured With: 48M Medipore Public affairs consultant Surgical T 2x10 (in/yd) ape Discharge Instructions: Secure with tape as directed. Com pression Wrap: ThreePress (3 layer compression wrap) Discharge Instructions: Apply three layer compression as directed. Com pression Wrap: Unnaboot w/Calamine, 4x10 (in/yd) Discharge Instructions: Apply Unnaboot as directed. 03/28/2022: The patient has been fairly noncompliant with his compression wraps. As result, he has opened a new area  of wounding on his anterior tibial surface just proximal to the original site. It is limited breakdown of skin. The original site is smaller and has a little slough buildup. I used a curette to debride the slough from his wound. Apparently at his last nurse visit, we applied triamcinolone and this seems to have helped with his compliance and keeping his wraps in place. We will do this again today and continue silver alginate with 3 layer compression, using the first layer of Unna boot at the top of the wrap. He will follow-up in 1 week. Electronic Signature(s) Signed: 03/28/2022 8:30:19 AM By: Fredirick Maudlin MD FACS Entered By: Fredirick Maudlin on 03/28/2022 08:30:18 -------------------------------------------------------------------------------- HxROS Details Patient Name: Date of Service: [name] RD O. 03/28/2022 8:00 A M Medical  Record Number: [id number] Patient Account Number: [id number] Date of Birth/Sex: Treating RN: April 23, 1956 (66 y.o. M) Primary Care Provider: [name] Other Clinician: Referring Provider: Treating Provider/Extender: [name] in Treatment: 4 Information Obtained From Patient Eyes Medical History: Positive for: Cataracts - 2021 Ear/Nose/Mouth/Throat Medical History: Negative for: Chronic sinus problems/congestion; Middle ear problems Hematologic/Lymphatic Medical History: Positive for: Lymphedema Respiratory Medical History: Positive for: Sleep Apnea Past Medical History Notes: Allergic Rhinitis Cardiovascular Medical History: Positive for: Congestive Heart Failure; Coronary Artery Disease; Hypertension Negative for: Deep Vein Thrombosis Past Medical History Notes: AFIB Gastrointestinal Medical History: Negative for: Cirrhosis ; Colitis; Crohns Endocrine Medical History: Negative for: Type I Diabetes; Type II Diabetes Genitourinary Medical History: Negative for: End Stage Renal Disease Immunological Medical History: Negative for: Raynauds; Scleroderma Integumentary (Skin) Medical History: Negative for: History of Burn Musculoskeletal Medical History: Negative for: Gout; Rheumatoid Arthritis; Osteoarthritis; Osteomyelitis Neurologic Medical History: Negative for: Dementia; Neuropathy; Quadriplegia; Paraplegia; Seizure Disorder Oncologic Medical History: Negative for: Received Chemotherapy; Received Radiation Psychiatric Medical History: Positive for: Confinement Anxiety Negative for: Anorexia/bulimia HBO Extended History Items Eyes: Cataracts Immunizations Pneumococcal Vaccine: Received Pneumococcal Vaccination: Yes Received Pneumococcal Vaccination On or After 60th Birthday: Yes Implantable Devices None Hospitalization / Surgery History Type of Hospitalization/Surgery Inguinal Hernia repair- 2014 insertion of  mesh-2014 Atrial ablation surgery- 2007 BIla eyes lasik- 2008 hernia repair- 1977 Family and Social History Cancer: Yes - Father,Mother; Diabetes: No; Heart Disease: No; Hereditary Spherocytosis: No; Hypertension: Yes - Mother,Father; Kidney Disease: No; Lung Disease: No; Seizures: No; Thyroid Problems: No; Tuberculosis: No; Never smoker; Marital Status - Married; Alcohol Use: Never; Drug Use: No History; Caffeine Use: Daily - [employer] dew; Financial Concerns: No; Food, Clothing or Shelter Needs: No; Transportation Concerns: No Electronic Signature(s) Signed: 03/28/2022 8:53:44 AM By: Fredirick Maudlin MD FACS Entered By: Fredirick Maudlin on 03/28/2022 08:26:17 -------------------------------------------------------------------------------- SuperBill Details Patient Name: Date of Service: [name] RD O. 03/28/2022 Medical Record Number: [id number] Patient Account Number: [id number] Date of Birth/Sex: Treating RN: [birth year] (66 y.o. M) Primary Care Provider: [name] Other Clinician: Referring Provider: Treating Provider/Extender: [name] in Treatment: 4 Diagnosis Coding ICD-10 Codes Code Description [phone] Non-pressure chronic ulcer of other part of left lower leg with fat layer exposed R60.0 Localized edema B51.02 Chronic systolic (congestive) heart failure I42.0 Dilated cardiomyopathy E66.01 Morbid (severe) obesity due to excess calories I10 Essential (primary) hypertension L97.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 other part of left lower leg limited to breakdown of skin Facility Procedures CPT4 Code: [id number] Description: [id number] - DEBRIDE WOUND 1ST 20 SQ CM OR < ICD-10 Diagnosis Description 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d Modifier:  Quantity: 1 Physician Procedures : CPT4 Code Description Modifier [id number] - WC PHYS LEVEL 3 - EST PT 25 ICD-10 Diagnosis Description L97.822  Non-pressure chronic ulcer of other part of left lower leg with fat layer exposed L97.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 other part of left  lower leg limited to breakdown of skin R60.0 Localized edema G66.59 Chronic systolic (congestive) heart failure Quantity: 1 : [id number] - WC PHYS DEBR WO ANESTH 20 SQ CM ICD-10 Diagnosis Description 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d Quantity: 1 Electronic Signature(s) Signed: 03/28/2022 8:30:37 AM By: Fredirick Maudlin MD FACS Entered By: Fredirick Maudlin on 03/28/2022 08:30:36

## 2022-03-31 ENCOUNTER — Encounter (HOSPITAL_COMMUNITY): Payer: Self-pay

## 2022-03-31 ENCOUNTER — Encounter (HOSPITAL_BASED_OUTPATIENT_CLINIC_OR_DEPARTMENT_OTHER): Payer: 59 | Admitting: General Surgery

## 2022-03-31 ENCOUNTER — Ambulatory Visit (HOSPITAL_COMMUNITY): Payer: 59 | Attending: Surgery

## 2022-04-01 ENCOUNTER — Encounter (HOSPITAL_BASED_OUTPATIENT_CLINIC_OR_DEPARTMENT_OTHER): Payer: 59 | Admitting: General Surgery

## 2022-04-01 DIAGNOSIS — L97822 Non-pressure chronic ulcer of other part of left lower leg with fat layer exposed: Secondary | ICD-10-CM | POA: Diagnosis not present

## 2022-04-01 NOTE — Progress Notes (Signed)
ALWALEED, OBESO ([id number]) Visit Report for 04/01/2022 SuperBill Details Patient Name: Date of Service: [facility] RD O. 04/01/2022 Medical Record Number: [id number] Patient Account Number: [id number] Date of Birth/Sex: Treating RN: 02/19/1956 (66 y.o. [name] Primary Care Provider: [name] Other Clinician: Referring Provider: Treating Provider/Extender: [name] in Treatment: 5 Diagnosis Coding ICD-10 Codes Code Description [phone] Non-pressure chronic ulcer of other part of left lower leg with fat layer exposed R60.0 Localized edema Y04.59 Chronic systolic (congestive) heart failure I42.0 Dilated cardiomyopathy E66.01 Morbid (severe) obesity due to excess calories I10 Essential (primary) hypertension L97.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 other part of left lower leg limited to breakdown of skin Facility Procedures CPT4 Code Description Modifier Quantity [phone] (Facility Use Only) [phone] - APPLY MULTLAY COMPRS LWR LT LEG 1 Electronic Signature(s) Signed: 04/01/2022 9:56:47 AM By: Fredirick Maudlin MD FACS Signed: 04/01/2022 5:30:40 PM By: Baruch Gouty RN, BSN Entered By: Baruch Gouty on 04/01/2022 08:12:28

## 2022-04-01 NOTE — Progress Notes (Signed)
[name], [name] ([id number]) Visit Report for 04/01/2022 Arrival Information Details Patient Name: Date of Service: [name] 04/01/2022 7:30 A M Medical Record Number: [id number] Patient Account Number: [id number] Date of Birth/Sex: Treating RN: 09/28/1955 (66 y.[name], [name] Primary Care [name]: [name] Other Clinician: Referring [name]: Treating [name]/Extender: [name] in Treatment: 5 Visit Information History Since Last Visit Added or deleted any medications: No Patient Arrived: Ambulatory Any new allergies or adverse reactions: No Arrival Time: 07:50 Had a fall or experienced change in No Accompanied By: self activities of daily living that may affect Transfer Assistance: None risk of falls: Patient Identification Verified: Yes Signs or symptoms of abuse/neglect since last visito No Secondary Verification Process Completed: Yes Hospitalized since last visit: No Patient Requires Transmission-Based Precautions: No Implantable device outside of the clinic excluding No Patient Has Alerts: No cellular tissue based products placed in the center since last visit: Has Dressing in Place as Prescribed: Yes Has Compression in Place as Prescribed: Yes Pain Present Now: No Electronic Signature(s) Signed: 04/01/2022 5:30:40 PM By: Baruch Gouty RN, BSN Entered By: Baruch Gouty on 04/01/2022 07:53:16 -------------------------------------------------------------------------------- Compression Therapy Details Patient Name: Date of Service: [name] 04/01/2022 7:30 A M Medical Record Number: [id number] Patient Account Number: [id number] Date of Birth/Sex: Treating RN: 1956-02-02 (43 y.[name] Primary Care [name]: [name] Other Clinician: Referring [name]: Treating [name]/Extender: [name] in Treatment: 5 Compression Therapy Performed for Wound  Assessment: Wound #1 Anterior Lower Leg Performed By: Clinician Baruch Gouty, RN Compression Type: Three Hydrologist) Signed: 04/01/2022 5:30:40 PM By: Baruch Gouty RN, BSN Entered By: Baruch Gouty on 04/01/2022 08:10:07 -------------------------------------------------------------------------------- Encounter Discharge Information Details Patient Name: Date of Service: [name] 04/01/2022 7:30 A M Medical Record Number: [id number] Patient Account Number: [id number] Date of Birth/Sex: Treating RN: October 22, 1955 (10 y.[name] Primary Care [name]: [name] Other Clinician: Referring [name]: Treating [name]/Extender: [name] in Treatment: 5 Encounter Discharge Information Items Discharge Condition: Stable Ambulatory Status: Ambulatory Discharge Destination: Home Transportation: Private Auto Accompanied By: self Schedule Follow-up Appointment: Yes Clinical Summary of Care: Patient Declined Electronic Signature(s) Signed: 04/01/2022 5:30:40 PM By: Baruch Gouty RN, BSN Entered By: Baruch Gouty on 04/01/2022 08:12:19 -------------------------------------------------------------------------------- Patient/Caregiver Education Details Patient Name: Date of Service: Clinton Liner RD Jenetta Downer 9/26/2023andnbsp7:30 [place] Record Number: [id number] Patient Account Number: [id number] Date of Birth/Gender: Treating RN: 10-Aug-1955 (6 y.[name] Primary Care Physician: [name] Other Clinician: Referring Physician: Treating Physician/Extender: [name] in Treatment: 5 Education Assessment Education Provided To: Patient Education Topics Provided Venous: Methods: Explain/Verbal Responses: Reinforcements needed, State content correctly Wound/Skin Impairment: Methods: Explain/Verbal Responses: Reinforcements needed, State content  correctly Electronic Signature(s) Signed: 04/01/2022 5:30:40 PM By: Baruch Gouty RN, BSN Entered By: Baruch Gouty on 04/01/2022 08:11:46 -------------------------------------------------------------------------------- Wound Assessment Details Patient Name: Date of Service: [name] 04/01/2022 7:30 A M Medical Record Number: [id number] Patient Account Number: [id number] Date of Birth/Sex: Treating RN: January 20, 1956 (7 y.[name] Primary Care Latiffany Harwick: [name] Other Clinician: Referring Amellia Panik: Treating Kelcy Baeten/Extender: [name] in Treatment: 5 Wound Status Wound Number: 1 Primary Etiology: Venous Leg Ulcer Wound Location: Anterior Lower Leg Wound Status: Open Wounding Event: Blister Date Acquired: 09/25/2021 Weeks Of Treatment: 5 Clustered Wound: Yes Wound Measurements Length: (cm) 8.5 Width: (cm)  10 Depth: (cm) 0.1 Area: (cm) 66.759 Volume: (cm) 6.676 % Reduction in Area: -1228% % Reduction in Volume: -1227.2% Wound Description Classification: Full Thickness Without Exposed Support Structu Exudate Amount: Large Exudate Type: Serous Exudate Color: amber res Treatment Notes Wound #1 (Lower Leg) Wound Laterality: Anterior Cleanser Soap and Water Discharge Instruction: May shower and wash wound with dial antibacterial soap and water prior to dressing change. Peri-Wound Care Sween Lotion (Moisturizing lotion) Discharge Instruction: Apply moisturizing lotion as directed Topical Primary Dressing KerraCel Ag Gelling Fiber Dressing, 4x5 in (silver alginate) Discharge Instruction: Apply silver alginate to wound bed as instructed Secondary Dressing Zetuvit Plus Silicone Border Dressing 4x4 (in/in) Discharge Instruction: Apply silicone border over primary dressing as directed. Secured With [employer] Surgical T 2x10 (in/yd) ape Discharge Instruction: Secure with tape as  directed. Compression Wrap ThreePress (3 layer compression wrap) Discharge Instruction: Apply three layer compression as directed. Unnaboot w/Calamine, 4x10 (in/yd) Discharge Instruction: Apply Unnaboot as directed. Compression Stockings Add-Ons Electronic Signature(s) Signed: 04/01/2022 5:30:40 PM By: Baruch Gouty RN, BSN Entered By: Baruch Gouty on 04/01/2022 08:09:39 -------------------------------------------------------------------------------- [place] Details Patient Name: Date of Service: [name] 04/01/2022 7:30 A M Medical Record Number: [id number] Patient Account Number: [id number] Date of Birth/Sex: Treating RN: Sep 21, 1955 (82 y.[name] Primary Care Jonpaul Lumm: [name] Other Clinician: Referring Luvern Mischke: Treating Adasha Boehme/Extender: [name] in Treatment: 5 Vital Signs Time Taken: 07:55 Temperature (F): 98 Height (in): 76 Pulse (bpm): 67 Weight (lbs): 322 Respiratory Rate (breaths/min): 20 Body Mass Index (BMI): 39.2 Blood Pressure (mmHg): 160/110 Reference Range: 80 - 120 mg / dl Electronic Signature(s) Signed: 04/01/2022 5:30:40 PM By: Baruch Gouty RN, BSN Entered By: Baruch Gouty on 04/01/2022 07:55:50

## 2022-04-02 ENCOUNTER — Other Ambulatory Visit (HOSPITAL_COMMUNITY): Payer: 59

## 2022-04-02 ENCOUNTER — Encounter (HOSPITAL_COMMUNITY): Payer: Self-pay

## 2022-04-02 ENCOUNTER — Encounter (HOSPITAL_COMMUNITY): Payer: Self-pay | Admitting: Cardiology

## 2022-04-02 NOTE — Progress Notes (Signed)
Verified appointment "no show" status with L. Walters at [facility].

## 2022-04-04 ENCOUNTER — Encounter (HOSPITAL_BASED_OUTPATIENT_CLINIC_OR_DEPARTMENT_OTHER): Payer: 59 | Admitting: General Surgery

## 2022-04-04 DIAGNOSIS — L97822 Non-pressure chronic ulcer of other part of left lower leg with fat layer exposed: Secondary | ICD-10-CM | POA: Diagnosis not present

## 2022-04-04 NOTE — Progress Notes (Signed)
KAMON, FAHR ([id number]) Visit Report for 04/04/2022 Arrival Information Details Patient Name: Date of Service: Clinton Liner RD O. 04/04/2022 8:00 A M Medical Record Number: [id number] Patient Account Number: [id number] Date of Birth/Sex: Treating RN: Feb 24, 1956 (66 y.o. [name] Primary Care [name]: [name] Other Clinician: Referring [name]: Treating [name]/Extender: [name] in Treatment: 5 Visit Information History Since Last Visit All ordered tests and consults were completed: Yes Patient Arrived: Ambulatory Added or deleted any medications: No Arrival Time: 08:24 Any new allergies or adverse reactions: No Accompanied By: self Had a fall or experienced change in No Transfer Assistance: None activities of daily living that may affect Patient Requires Transmission-Based Precautions: No risk of falls: Patient Has Alerts: No Signs or symptoms of abuse/neglect since last visito No Hospitalized since last visit: No Implantable device outside of the clinic excluding No cellular tissue based products placed in the center since last visit: Has Dressing in Place as Prescribed: Yes Has Compression in Place as Prescribed: Yes Pain Present Now: No Electronic Signature(s) Signed: 04/04/2022 4:38:59 PM By: Clinton East RN Entered By: [name] on 04/04/2022 08:28:49 -------------------------------------------------------------------------------- Lower Extremity Assessment Details Patient Name: Date of Service: Clinton Liner RD O. 04/04/2022 8:00 A M Medical Record Number: [id number] Patient Account Number: [id number] Date of Birth/Sex: Treating RN: August 26, 1955 (66 y.o. [name] Primary Care [name]: [name] Other Clinician: Referring [name]: Treating [name]: [name] in Treatment: 5 Edema Assessment Assessed: [Left: No] [Right: No] [Left: Edema] [Right:  :] Calf Left: Right: Point of Measurement: 38 cm From Medial Instep 54 cm Ankle Left: Right: Point of Measurement: 11 cm From Medial Instep 34 cm Vascular Assessment Pulses: Dorsalis Pedis Palpable: [Left:Yes] Electronic Signature(s) Signed: 04/04/2022 4:38:59 PM By: Clinton East RN Entered By: [name] on 04/04/2022 08:29:36 -------------------------------------------------------------------------------- Multi Wound Chart Details Patient Name: Date of Service: Clinton Liner RD O. 04/04/2022 8:00 A M Medical Record Number: [id number] Patient Account Number: [id number] Date of Birth/Sex: Treating RN: 08-Dec-1955 (66 y.o. M) Primary Care Amry Cathy: [name] Other Clinician: Referring Cyrus Ramsburg: Treating [name]: [name] in Treatment: 5 Vital Signs Height(in): 43 Pulse(bpm): 52 Weight(lbs): 681 Blood Pressure(mmHg): 165/112 Body Mass Index(BMI): 39.2 Temperature(F): 97.8 Respiratory Rate(breaths/min): 20 Photos: [N/A:N/A] Anterior Lower Leg Right, Anterior Lower Leg N/A Wound Location: Blister Blister N/A Wounding Event: Venous Leg Ulcer Lymphedema N/A Primary Etiology: Cataracts, Lymphedema, Sleep Cataracts, Lymphedema, Sleep N/A Comorbid History: Apnea, Congestive Heart Failure, Apnea, Congestive Heart Failure, Coronary Artery Disease, Coronary Artery Disease, Hypertension, Confinement Anxiety Hypertension, Confinement Anxiety 09/25/2021 04/04/2022 N/A Date Acquired: 5 0 N/A Weeks of Treatment: Open Open N/A Wound Status: No No N/A Wound Recurrence: Yes No N/A Clustered Wound: 9x8x0.1 1x0.6x0.1 N/A Measurements L x W x D (cm) 56.549 0.471 N/A A (cm) : rea 5.655 0.047 N/A Volume (cm) : -1024.90% N/A N/A % Reduction in A rea: -1024.30% N/A N/A % Reduction in Volume: Full Thickness Without Exposed Full Thickness Without Exposed N/A Classification: Support Structures Support Structures Large N/A  N/A Exudate A mount: Serous N/A N/A Exudate Type: amber N/A N/A Exudate Color: Medium (34-66%) Medium (34-66%) N/A Granulation A mount: N/A Pink N/A Granulation Quality: Medium (34-66%) Medium (34-66%) N/A Necrotic A mount: Fat Layer (Subcutaneous Tissue): Yes Fat Layer (Subcutaneous Tissue): Yes N/A Exposed Structures: None N/A N/A Epithelialization: Debridement - Selective/Open Wound Debridement - Selective/Open Wound N/A Debridement: Pre-procedure Verification/Time Out 08:42 08:42 N/A Taken: Lidocaine 5% topical  ointment Lidocaine 5% topical ointment N/A Pain Control: [facility] N/A Tissue Debrided: Non-Viable Tissue Non-Viable Tissue N/A Level: 72 0.6 N/A Debridement A (sq cm): rea Curette Curette N/A Instrument: Minimum Minimum N/A Bleeding: Pressure Pressure N/A Hemostasis Achieved: 0 0 N/A Procedural Pain: 0 0 N/A Post Procedural Pain: Procedure was tolerated well Procedure was tolerated well N/A Debridement Treatment Response: 9x8x0.1 1x0.6x0.1 N/A Post Debridement Measurements L x W x D (cm) 5.655 0.047 N/A Post Debridement Volume: (cm) Debridement Debridement N/A Procedures Performed: Treatment Notes Wound #1 (Lower Leg) Wound Laterality: Anterior Cleanser Soap and Water Discharge Instruction: May shower and wash wound with dial antibacterial soap and water prior to dressing change. Peri-Wound Care Sween Lotion (Moisturizing lotion) Discharge Instruction: Apply moisturizing lotion as directed Topical Triamcinolone Discharge Instruction: Apply Triamcinolone as directed Primary Dressing KerraCel Ag Gelling Fiber Dressing, 4x5 in (silver alginate) Discharge Instruction: Apply silver alginate to wound bed as instructed Secondary Dressing Zetuvit Plus Silicone Border Dressing 4x4 (in/in) Discharge Instruction: Apply silicone border over primary dressing as directed. Secured With [employer] Surgical T 2x10 (in/yd) ape Discharge  Instruction: Secure with tape as directed. Compression Wrap ThreePress (3 layer compression wrap) Discharge Instruction: Apply three layer compression as directed. Unnaboot w/Calamine, 4x10 (in/yd) Discharge Instruction: Apply Unnaboot as directed. Compression Stockings Add-Ons Wound #2 (Lower Leg) Wound Laterality: Right, Anterior Cleanser Soap and Water Discharge Instruction: May shower and wash wound with dial antibacterial soap and water prior to dressing change. Peri-Wound Care Sween Lotion (Moisturizing lotion) Discharge Instruction: Apply moisturizing lotion as directed Topical Triamcinolone Discharge Instruction: Apply Triamcinolone as directed Primary Dressing KerraCel Ag Gelling Fiber Dressing, 4x5 in (silver alginate) Discharge Instruction: Apply silver alginate to wound bed as instructed Secondary Dressing Zetuvit Plus Silicone Border Dressing 4x4 (in/in) Discharge Instruction: Apply silicone border over primary dressing as directed. Secured With [employer] Surgical T 2x10 (in/yd) ape Discharge Instruction: Secure with tape as directed. Compression Wrap ThreePress (3 layer compression wrap) Discharge Instruction: Apply three layer compression as directed. Unnaboot w/Calamine, 4x10 (in/yd) Discharge Instruction: Apply Unnaboot as directed. Compression Stockings Add-Ons Electronic Signature(s) Signed: 04/04/2022 9:10:38 AM By: Fredirick Maudlin MD FACS Entered By: Fredirick Maudlin on 04/04/2022 09:10:38 -------------------------------------------------------------------------------- Multi-Disciplinary Care Plan Details Patient Name: Date of Service: Clinton Liner RD O. 04/04/2022 8:00 A M Medical Record Number: [id number] Patient Account Number: [id number] Date of Birth/Sex: Treating RN: 04/11/1956 (66 y.o. [name] Primary Care Sukhmani Fetherolf: [name] Other Clinician: Referring Yoshio Seliga: Treating Sterling Ucci/Extender: [name] in Treatment: 5 Active Inactive Venous Leg Ulcer Nursing Diagnoses: Potential for venous Insuffiency (use before diagnosis confirmed) Goals: Patient will maintain optimal edema control Date Initiated: 02/24/2022 Target Resolution Date: 05/02/2022 Goal Status: Active Interventions: Assess peripheral edema status every visit. Compression as ordered Notes: Wound/Skin Impairment Nursing Diagnoses: Impaired tissue integrity Goals: Ulcer/skin breakdown will have a volume reduction of 30% by week 4 Date Initiated: 02/24/2022 Date Inactivated: 04/04/2022 Target Resolution Date: 03/31/2022 Goal Status: Unmet Unmet Reason: pt legs swelling Ulcer/skin breakdown will have a volume reduction of 50% by week 8 Date Initiated: 04/04/2022 Target Resolution Date: 05/02/2022 Goal Status: Active Interventions: Assess patient/caregiver ability to perform ulcer/skin care regimen upon admission and as needed Treatment Activities: Topical wound management initiated : 02/24/2022 Notes: Electronic Signature(s) Signed: 04/04/2022 4:38:59 PM By: Clinton East RN Entered By: [name] on 04/04/2022 08:42:24 -------------------------------------------------------------------------------- Pain Assessment Details Patient Name: Date of Service: [name], Clinton Fenton RD O. 04/04/2022 8:00 A M  Medical Record Number: [id number] Patient Account Number: [id number] Date of Birth/Sex: Treating RN: Oct 03, 1955 (66 y.o. [name] Primary Care Everett Ricciardelli: [name] Other Clinician: Referring Lian Tanori: Treating Breyer Tejera/Extender: [name] in Treatment: 5 Active Problems Location of Pain Severity and Description of Pain Patient Has Paino No Site Locations Pain Management and Medication Current Pain Management: Electronic Signature(s) Signed: 04/04/2022 4:38:59 PM By: Clinton East RN Entered By: [name] on 04/04/2022  08:29:09 -------------------------------------------------------------------------------- Patient/Caregiver Education Details Patient Name: Date of Service: Clinton Liner RD Jenetta Downer 9/29/2023andnbsp8:00 A M Medical Record Number: [id number] Patient Account Number: [id number] Date of Birth/Gender: Treating RN: 03/13/1956 (66 y.o. [name] Primary Care Physician: [name] Other Clinician: Referring Physician: Treating Physician/Extender: [name] in Treatment: 5 Education Assessment Education Provided To: Patient Education Topics Provided Wound/Skin Impairment: Methods: Explain/Verbal Responses: Reinforcements needed, State content correctly Electronic Signature(s) Signed: 04/04/2022 4:38:59 PM By: Clinton East RN Entered By: [name] on 04/04/2022 08:42:39 -------------------------------------------------------------------------------- Wound Assessment Details Patient Name: Date of Service: Clinton Liner RD O. 04/04/2022 8:00 A M Medical Record Number: [id number] Patient Account Number: [id number] Date of Birth/Sex: Treating RN: 02-07-1956 (66 y.o. [name] Primary Care Atiyah Bauer: [name] Other Clinician: Referring Tamika Nou: Treating Heena Woodbury/Extender: [name] in Treatment: 5 Wound Status Wound Number: 1 Primary Venous Leg Ulcer Etiology: Wound Location: Anterior Lower Leg Wound Open Wounding Event: Blister Status: Date Acquired: 09/25/2021 Comorbid Cataracts, Lymphedema, Sleep Apnea, Congestive Heart Failure, Weeks Of Treatment: 5 History: Coronary Artery Disease, Hypertension, Confinement Anxiety Clustered Wound: Yes Photos Wound Measurements Length: (cm) 9 Width: (cm) 8 Depth: (cm) 0.1 Area: (cm) 56.549 Volume: (cm) 5.655 % Reduction in Area: -1024.9% % Reduction in Volume: -1024.3% Epithelialization: None Tunneling: No Undermining: No Wound  Description Classification: Full Thickness Without Exposed Support Struc Exudate Amount: Large Exudate Type: Serous Exudate Color: amber tures Wound Bed Granulation Amount: Medium (34-66%) Exposed Structure Necrotic Amount: Medium (34-66%) Fat Layer (Subcutaneous Tissue) Exposed: Yes Necrotic Quality: Adherent Slough Treatment Notes Wound #1 (Lower Leg) Wound Laterality: Anterior Cleanser Soap and Water Discharge Instruction: May shower and wash wound with dial antibacterial soap and water prior to dressing change. Peri-Wound Care Sween Lotion (Moisturizing lotion) Discharge Instruction: Apply moisturizing lotion as directed Topical Triamcinolone Discharge Instruction: Apply Triamcinolone as directed Primary Dressing KerraCel Ag Gelling Fiber Dressing, 4x5 in (silver alginate) Discharge Instruction: Apply silver alginate to wound bed as instructed Secondary Dressing Zetuvit Plus Silicone Border Dressing 4x4 (in/in) Discharge Instruction: Apply silicone border over primary dressing as directed. Secured With [employer] Surgical T 2x10 (in/yd) ape Discharge Instruction: Secure with tape as directed. Compression Wrap ThreePress (3 layer compression wrap) Discharge Instruction: Apply three layer compression as directed. Unnaboot w/Calamine, 4x10 (in/yd) Discharge Instruction: Apply Unnaboot as directed. Compression Stockings Add-Ons Electronic Signature(s) Signed: 04/04/2022 4:38:59 PM By: Clinton East RN Entered By: [name] on 04/04/2022 08:34:25 -------------------------------------------------------------------------------- Wound Assessment Details Patient Name: Date of Service: Clinton Liner RD O. 04/04/2022 8:00 A M Medical Record Number: [id number] Patient Account Number: [id number] Date of Birth/Sex: Treating RN: 05/21/1956 (66 y.o. [name] Primary Care Manila Rommel: [name] Other Clinician: Referring Nathan Stallworth: Treating  Shilee Biggs/Extender: [name] in Treatment: 5 Wound Status Wound Number: 2 Primary Lymphedema Etiology: Wound Location: Right, Anterior Lower Leg Wound Open Wounding Event: Blister Status: Date Acquired: 04/04/2022 Comorbid Cataracts, Lymphedema, Sleep Apnea, Congestive Heart Failure, Weeks Of Treatment: 0 History: Coronary Artery  Disease, Hypertension, Confinement Anxiety Clustered Wound: No Photos Wound Measurements Length: (cm) 1 Width: (cm) 0.6 Depth: (cm) 0.1 Area: (cm) 0.471 Volume: (cm) 0.047 % Reduction in Area: % Reduction in Volume: Tunneling: No Undermining: No Wound Description Classification: Full Thickness Without Exposed Support Structur es Foul Odor After Cleansing: No Slough/Fibrino Yes Wound Bed Granulation Amount: Medium (34-66%) Exposed Structure Granulation Quality: Pink Fat Layer (Subcutaneous Tissue) Exposed: Yes Necrotic Amount: Medium (34-66%) Necrotic Quality: Adherent Slough Treatment Notes Wound #2 (Lower Leg) Wound Laterality: Right, Anterior Cleanser Soap and Water Discharge Instruction: May shower and wash wound with dial antibacterial soap and water prior to dressing change. Peri-Wound Care Sween Lotion (Moisturizing lotion) Discharge Instruction: Apply moisturizing lotion as directed Topical Triamcinolone Discharge Instruction: Apply Triamcinolone as directed Primary Dressing KerraCel Ag Gelling Fiber Dressing, 4x5 in (silver alginate) Discharge Instruction: Apply silver alginate to wound bed as instructed Secondary Dressing Zetuvit Plus Silicone Border Dressing 4x4 (in/in) Discharge Instruction: Apply silicone border over primary dressing as directed. Secured With [employer] Surgical T 2x10 (in/yd) ape Discharge Instruction: Secure with tape as directed. Compression Wrap ThreePress (3 layer compression wrap) Discharge Instruction: Apply three layer compression as directed. Unnaboot  w/Calamine, 4x10 (in/yd) Discharge Instruction: Apply Unnaboot as directed. Compression Stockings Add-Ons Electronic Signature(s) Signed: 04/04/2022 4:38:59 PM By: Clinton East RN Entered By: [name] on 04/04/2022 08:36:08 -------------------------------------------------------------------------------- Vitals Details Patient Name: Date of Service: Clinton Liner RD O. 04/04/2022 8:00 A M Medical Record Number: [id number] Patient Account Number: [id number] Date of Birth/Sex: Treating RN: 05/12/1956 (66 y.o. [name] Primary Care Aleric Froelich: [name] Other Clinician: Referring Bobbye Petti: Treating Layden Caterino/Extender: [name] in Treatment: 5 Vital Signs Time Taken: 08:28 Temperature (F): 97.8 Height (in): 76 Pulse (bpm): 85 Weight (lbs): 322 Respiratory Rate (breaths/min): 20 Body Mass Index (BMI): 39.2 Blood Pressure (mmHg): 165/112 Reference Range: 80 - 120 mg / dl Electronic Signature(s) Signed: 04/04/2022 4:38:59 PM By: Clinton East RN Entered By: [name] on 04/04/2022 08:29:05

## 2022-04-04 NOTE — Progress Notes (Signed)
SAID, RUEB ([id number]) Visit Report for 04/04/2022 Chief Complaint Document Details Patient Name: Date of Service: [name] RD O. 04/04/2022 8:00 A M Medical Record Number: [id number] Patient Account Number: [id number] Date of Birth/Sex: Treating RN: 1956-05-02 (66 y.o. M) Primary Care Provider: [name] Other Clinician: Referring Provider: Treating Provider/Extender: [name] in Treatment: 5 Information Obtained from: Patient Chief Complaint Patient presents for treatment of an open ulcer due to venous insufficiency Electronic Signature(s) Signed: 04/04/2022 9:10:56 AM By: Clinton Maudlin MD FACS Entered By: [name] on 04/04/2022 09:10:56 -------------------------------------------------------------------------------- Debridement Details Patient Name: Date of Service: [name] RD O. 04/04/2022 8:00 A M Medical Record Number: [id number] Patient Account Number: [id number] Date of Birth/Sex: Treating RN: [birth year] (66 y.o. [name] Primary Care Provider: [name] Other Clinician: Referring Provider: Treating Provider/Extender: [name] in Treatment: 5 Debridement Performed for Assessment: Wound #1 Anterior Lower Leg Performed By: Physician Clinton Maudlin, MD Debridement Type: Debridement Severity of Tissue Pre Debridement: Fat layer exposed Level of Consciousness (Pre-procedure): Awake and Alert Pre-procedure Verification/Time Out Yes - 08:42 Taken: Start Time: 08:43 Pain Control: Lidocaine 5% topical ointment T Area Debrided (L x W): otal 9 (cm) x 8 (cm) = 72 (cm) Tissue and other material debrided: Non-Viable, Slough, Slough Level: Non-Viable Tissue Debridement Description: Selective/Open Wound Instrument: Curette Bleeding: Minimum Hemostasis Achieved: Pressure Procedural Pain: 0 Post Procedural Pain: 0 Response to Treatment: Procedure was tolerated well Level  of Consciousness (Post- Awake and Alert procedure): Post Debridement Measurements of Total Wound Length: (cm) 9 Width: (cm) 8 Depth: (cm) 0.1 Volume: (cm) 5.655 Character of Wound/Ulcer Post Debridement: Requires Further Debridement Severity of Tissue Post Debridement: Fat layer exposed Post Procedure Diagnosis Same as Pre-procedure Notes Scribed for Dr. Celine Ahr by Blanche East, RN Electronic Signature(s) Signed: 04/04/2022 12:16:21 PM By: Clinton Maudlin MD FACS Signed: 04/04/2022 4:38:59 PM By: Blanche East RN Entered By: Blanche East on 04/04/2022 08:44:27 -------------------------------------------------------------------------------- Debridement Details Patient Name: Date of Service: [name] RD O. 04/04/2022 8:00 A M Medical Record Number: [id number] Patient Account Number: [id number] Date of Birth/Sex: Treating RN: [birth year] (66 y.o. [name] Primary Care Provider: [name] Other Clinician: Referring Provider: Treating Provider/Extender: [name] in Treatment: 5 Debridement Performed for Assessment: Wound #2 Right,Anterior Lower Leg Performed By: Physician Clinton Maudlin, MD Debridement Type: Debridement Level of Consciousness (Pre-procedure): Awake and Alert Pre-procedure Verification/Time Out Yes - 08:42 Taken: Start Time: 08:43 Pain Control: Lidocaine 5% topical ointment T Area Debrided (L x W): otal 1 (cm) x 0.6 (cm) = 0.6 (cm) Tissue and other material debrided: Non-Viable, Slough, Slough Level: Non-Viable Tissue Debridement Description: Selective/Open Wound Instrument: Curette Bleeding: Minimum Hemostasis Achieved: Pressure Procedural Pain: 0 Post Procedural Pain: 0 Response to Treatment: Procedure was tolerated well Level of Consciousness (Post- Awake and Alert procedure): Post Debridement Measurements of Total Wound Length: (cm) 1 Width: (cm) 0.6 Depth: (cm) 0.1 Volume: (cm) 0.047 Character  of Wound/Ulcer Post Debridement: Requires Further Debridement Post Procedure Diagnosis Same as Pre-procedure Notes Scribed for Dr. Celine Ahr by Blanche East, RN Electronic Signature(s) Signed: 04/04/2022 12:16:21 PM By: Clinton Maudlin MD FACS Signed: 04/04/2022 4:38:59 PM By: Blanche East RN Entered By: Blanche East on 04/04/2022 08:45:14 -------------------------------------------------------------------------------- HPI Details Patient Name: Date of Service: [name] RD O. 04/04/2022 8:00 A M Medical Record Number: [id number] Patient Account Number: [id number] Date of Birth/Sex: Treating RN: 08-Mar-1956 (66 y.o. M) Primary Care  Provider: [name] Other Clinician: Referring Provider: Treating Provider/Extender: [name] in Treatment: 5 History of Present Illness HPI Description: ADMISSION 02/24/2022 This is a 66 year old man with a past medical history significant for congestive heart failure, morbid obesity, dilated cardiomyopathy, atrial fibrillation, and lower extremity cellulitis. He is currently being treated with Keflex. He is not diabetic. He does not smoke. He says that he developed some blisters on his left anterior tibial surface which subsequently broke open causing the wounds for which she is here to see [place] today. He says that he occasionally wears compression stockings but does not do so on a regular basis. He reports that "they look stupid with shorts." ABI in clinic today was 0.94. On the left anterior tibial surface, there are several small wounds in a geographic pattern. They do appear consistent with blisters that have ruptured. The fat layer is exposed and there is a thin layer of slough on the surfaces. He does have some surrounding erythema, but no purulent drainage. Edema control is very poor. 03/03/2022: The patient came in for a nurse visit last week because he thought the compression wraps were too tight. They were reapplied  and apparently he did not understand the instruction to try to stay off his feet is much as possible and keep his leg elevated; he instead walked excessively on both Friday and Saturday. As result his leg became more swollen, the wrap became uncomfortable and he cut it off yesterday. He replaced it with a wrap of his own fashion. Today he has an indentation in his leg where his homemade wrap ended. He has extensive 3+ nonpitting edema above this. The wounds themselves are in fairly decent condition with just a little bit of slough on the surface, but edema fluid is frankly pouring out of the largest wound. 03/14/2022: Significant improvement in his wound. He has come in a couple of times for nurse visits due to drainage. Today the wound is quite a bit smaller and just has a layer of eschar and slough on the surface. Edema control is better. 03/23/2022: Ongoing improvement in his wound. His edema control is suboptimal today, however, because his wrap slid down. There is also evidence of excoriation suggesting that perhaps he was scratching under the wrap and it slid. There is a little bit of eschar and slough on the wound surface. 03/28/2022: The patient has been fairly noncompliant with his compression wraps. As result, he has opened a new area of wounding on his anterior tibial surface just proximal to the original site. It is limited breakdown of skin. The original site is smaller and has a little slough buildup. He did see his cardiologist this week and is now taking torsemide twice a day. 04/04/2022: The patient continues to be fairly noncompliant with his care recommendations. He fails to show up for his venous reflux studies on Monday. He continues to cut his wraps off. Although he is supposed to be taking torsemide twice a day, the degree of edema in his legs suggest that perhaps he is not being compliant with his medication, as well. His blood pressure was elevated today and he said that he did not  take his antihypertensives. He claims that the methocarbamol and tizanidine he was prescribed for muscle spasms are responsible for his leg swelling. He has developed a new wound on his right anterior tibial surface. This is fairly small and limited to breakdown of skin. There is a small amount of slough present. The  wounds on his left lower leg do not look at all improved. There is slough accumulation on a couple of the deeper spots. He also has an area on his dorsal foot and anterior ankle that look like they are threatening to breakdown; this is where he had cut his wrap back to so that they were rubbing right in this location. Electronic Signature(s) Signed: 04/04/2022 9:15:48 AM By: Clinton Maudlin MD FACS Entered By: [name] on 04/04/2022 09:15:48 -------------------------------------------------------------------------------- Physical Exam Details Patient Name: Date of Service: [name] RD O. 04/04/2022 8:00 A M Medical Record Number: [id number] Patient Account Number: [id number] Date of Birth/Sex: Treating RN: 1955/08/24 (66 y.o. M) Primary Care Provider: [name] Other Clinician: Referring Provider: Treating Provider/Extender: [name] in Treatment: 5 Constitutional Hypertensive, asymptomatic. . . . No acute distress.Marland Kitchen Respiratory . Cardiovascular 3+ pitting edema bilaterally. Notes 04/04/2022: He has developed a new wound on his right anterior tibial surface. This is fairly small and limited to breakdown of skin. There is a small amount of slough present. The wounds on his left lower leg do not look at all improved. There is slough accumulation on a couple of the deeper spots. He also has an area on his dorsal foot and anterior ankle that look like they are threatening to breakdown; this is where he had cut his wrap back to so that they were rubbing right in this location. Electronic Signature(s) Signed: 04/04/2022 9:19:29 AM By:  Clinton Maudlin MD FACS Previous Signature: 04/04/2022 9:17:07 AM Version By: Clinton Maudlin MD FACS Entered By: [name] on 04/04/2022 09:19:29 -------------------------------------------------------------------------------- Physician Orders Details Patient Name: Date of Service: [name] RD O. 04/04/2022 8:00 A M Medical Record Number: [id number] Patient Account Number: [id number] Date of Birth/Sex: Treating RN: 03/23/1956 (66 y.o. [name] Primary Care Provider: [name] Other Clinician: Referring Provider: Treating Provider/Extender: [name] in Treatment: 5 Verbal / Phone Orders: No Diagnosis Coding ICD-10 Coding Code Description [phone] Non-pressure chronic ulcer of other part of left lower leg with fat layer exposed R60.0 Localized edema R51.88 Chronic systolic (congestive) heart failure I42.0 Dilated cardiomyopathy E66.01 Morbid (severe) obesity due to excess calories I10 Essential (primary) hypertension L97.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 other part of left lower leg limited to breakdown of skin L97.811 Non-pressure chronic ulcer of other part of right lower leg limited to breakdown of skin Follow-up Appointments ppointment in 1 week. - Dr. Celine Ahr Rm 4 Return A Nurse Visit: - Room Anesthetic Wound #1 Anterior Lower Leg (In clinic) Topical Lidocaine 5% applied to wound bed - in clinic, prior to debridement Bathing/ Shower/ Hygiene May shower with protection but do not get wound dressing(s) wet. - use cast protector on Left lower leg Edema Control - Lymphedema / SCD / Other Avoid standing for long periods of time. Patient to wear own compression stockings every day. - wear compression stockings Right leg Wound Treatment Wound #1 - Lower Leg Wound Laterality: Anterior Cleanser: Soap and Water Discharge Instructions: May shower and wash wound with dial antibacterial soap and water prior to dressing  change. Peri-Wound Care: Sween Lotion (Moisturizing lotion) Discharge Instructions: Apply moisturizing lotion as directed Topical: Triamcinolone Discharge Instructions: Apply Triamcinolone as directed Prim Dressing: KerraCel Ag Gelling Fiber Dressing, 4x5 in (silver alginate) ary Discharge Instructions: Apply silver alginate to wound bed as instructed Secondary Dressing: Zetuvit Plus Silicone Border Dressing 4x4 (in/in) Discharge Instructions: Apply silicone border over primary dressing as directed. Secured With:  16M Medipore Soft Cloth Surgical T 2x10 (in/yd) ape Discharge Instructions: Secure with tape as directed. Compression Wrap: ThreePress (3 layer compression wrap) Discharge Instructions: Apply three layer compression as directed. Wound #2 - Lower Leg Wound Laterality: Right, Anterior Cleanser: Soap and Water Discharge Instructions: May shower and wash wound with dial antibacterial soap and water prior to dressing change. Peri-Wound Care: Sween Lotion (Moisturizing lotion) Discharge Instructions: Apply moisturizing lotion as directed Topical: Triamcinolone Discharge Instructions: Apply Triamcinolone as directed Prim Dressing: KerraCel Ag Gelling Fiber Dressing, 4x5 in (silver alginate) ary Discharge Instructions: Apply silver alginate to wound bed as instructed Secondary Dressing: Zetuvit Plus Silicone Border Dressing 4x4 (in/in) Discharge Instructions: Apply silicone border over primary dressing as directed. Secured With: 16M Medipore Public affairs consultant Surgical T 2x10 (in/yd) ape Discharge Instructions: Secure with tape as directed. Compression Wrap: ThreePress (3 layer compression wrap) Discharge Instructions: Apply three layer compression as directed. Compression Wrap: Unnaboot w/Calamine, 4x10 (in/yd) Discharge Instructions: Apply Unnaboot as directed. Electronic Signature(s) Signed: 04/04/2022 12:16:21 PM By: Clinton Maudlin MD FACS Signed: 04/04/2022 4:38:59 PM By: Blanche East RN Entered By: Blanche East on 04/04/2022 10:12:50 -------------------------------------------------------------------------------- Problem List Details Patient Name: Date of Service: [name] RD O. 04/04/2022 8:00 A M Medical Record Number: [id number] Patient Account Number: [id number] Date of Birth/Sex: Treating RN: [birth year] (66 y.o. [name] Primary Care Provider: [name] Other Clinician: Referring Provider: Treating Provider/Extender: [name] in Treatment: 5 Active Problems ICD-10 Encounter Code Description Active Date MDM Diagnosis [phone] Non-pressure chronic ulcer of other part of left lower leg with fat layer exposed 02/24/2022 No Yes R60.0 Localized edema 02/24/2022 No Yes P53.61 Chronic systolic (congestive) heart failure 02/24/2022 No Yes I42.0 Dilated cardiomyopathy 02/24/2022 No Yes E66.01 Morbid (severe) obesity due to excess calories 02/24/2022 No Yes I10 Essential (primary) hypertension 02/24/2022 No Yes L97.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 other part of left lower leg limited to breakdown 03/28/2022 No Yes of skin L97.811 Non-pressure chronic ulcer of other part of right lower leg limited to breakdown 04/04/2022 No Yes of skin Inactive Problems Resolved Problems Electronic Signature(s) Signed: 04/04/2022 9:10:23 AM By: Clinton Maudlin MD FACS Entered By: [name] on 04/04/2022 09:10:23 -------------------------------------------------------------------------------- Progress Note Details Patient Name: Date of Service: [name] RD O. 04/04/2022 8:00 A M Medical Record Number: [id number] Patient Account Number: [id number] Date of Birth/Sex: Treating RN: 1955-09-06 (66 y.o. M) Primary Care Provider: [name] Other Clinician: Referring Provider: Treating Provider/Extender: [name] in Treatment: 5 Subjective Chief Complaint Information obtained from  Patient Patient presents for treatment of an open ulcer due to venous insufficiency History of Present Illness (HPI) ADMISSION 02/24/2022 This is a 66 year old man with a past medical history significant for congestive heart failure, morbid obesity, dilated cardiomyopathy, atrial fibrillation, and lower extremity cellulitis. He is currently being treated with Keflex. He is not diabetic. He does not smoke. He says that he developed some blisters on his left anterior tibial surface which subsequently broke open causing the wounds for which she is here to see [place] today. He says that he occasionally wears compression stockings but does not do so on a regular basis. He reports that "they look stupid with shorts." ABI in clinic today was 0.94. On the left anterior tibial surface, there are several small wounds in a geographic pattern. They do appear consistent with blisters that have ruptured. The fat layer is exposed and there is a thin layer of slough on the  surfaces. He does have some surrounding erythema, but no purulent drainage. Edema control is very poor. 03/03/2022: The patient came in for a nurse visit last week because he thought the compression wraps were too tight. They were reapplied and apparently he did not understand the instruction to try to stay off his feet is much as possible and keep his leg elevated; he instead walked excessively on both Friday and Saturday. As result his leg became more swollen, the wrap became uncomfortable and he cut it off yesterday. He replaced it with a wrap of his own fashion. Today he has an indentation in his leg where his homemade wrap ended. He has extensive 3+ nonpitting edema above this. The wounds themselves are in fairly decent condition with just a little bit of slough on the surface, but edema fluid is frankly pouring out of the largest wound. 03/14/2022: Significant improvement in his wound. He has come in a couple of times for nurse visits due to  drainage. Today the wound is quite a bit smaller and just has a layer of eschar and slough on the surface. Edema control is better. 03/23/2022: Ongoing improvement in his wound. His edema control is suboptimal today, however, because his wrap slid down. There is also evidence of excoriation suggesting that perhaps he was scratching under the wrap and it slid. There is a little bit of eschar and slough on the wound surface. 03/28/2022: The patient has been fairly noncompliant with his compression wraps. As result, he has opened a new area of wounding on his anterior tibial surface just proximal to the original site. It is limited breakdown of skin. The original site is smaller and has a little slough buildup. He did see his cardiologist this week and is now taking torsemide twice a day. 04/04/2022: The patient continues to be fairly noncompliant with his care recommendations. He fails to show up for his venous reflux studies on Monday. He continues to cut his wraps off. Although he is supposed to be taking torsemide twice a day, the degree of edema in his legs suggest that perhaps he is not being compliant with his medication, as well. His blood pressure was elevated today and he said that he did not take his antihypertensives. He claims that the methocarbamol and tizanidine he was prescribed for muscle spasms are responsible for his leg swelling. He has developed a new wound on his right anterior tibial surface. This is fairly small and limited to breakdown of skin. There is a small amount of slough present. The wounds on his left lower leg do not look at all improved. There is slough accumulation on a couple of the deeper spots. He also has an area on his dorsal foot and anterior ankle that look like they are threatening to breakdown; this is where he had cut his wrap back to so that they were rubbing right in this location. Patient History Information obtained from Patient. Family History Cancer -  Father,Mother, Hypertension - Mother,Father, No family history of Diabetes, Heart Disease, Hereditary Spherocytosis, Kidney Disease, Lung Disease, Seizures, Thyroid Problems, Tuberculosis. 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 Medical History Eyes Patient has history of Cataracts - 2021 Ear/Nose/Mouth/Throat Denies history of Chronic sinus problems/congestion, Middle ear problems Hematologic/Lymphatic Patient has history of Lymphedema Respiratory Patient has history of Sleep Apnea Cardiovascular Patient has history of Congestive Heart Failure, Coronary Artery Disease, Hypertension Denies history  of Deep Vein Thrombosis Gastrointestinal Denies history of Cirrhosis , Colitis, Crohnoos Endocrine Denies history of Type I Diabetes, Type II Diabetes Genitourinary Denies history of End Stage Renal Disease Immunological Denies history of Raynaudoos, Scleroderma Integumentary (Skin) Denies history of History of Burn Musculoskeletal Denies history of Gout, Rheumatoid Arthritis, Osteoarthritis, Osteomyelitis Neurologic Denies history of Dementia, Neuropathy, Quadriplegia, Paraplegia, Seizure Disorder Oncologic Denies history of Received Chemotherapy, Received Radiation Psychiatric Patient has history of Confinement Anxiety Denies history of Anorexia/bulimia Hospitalization/Surgery History - Inguinal Hernia repair- 2014. - insertion of mesh-2014. - Atrial ablation surgery- 2007. - BIla eyes lasik- 2008. - hernia repair- 1977. Medical A Surgical History Notes nd Respiratory Allergic Rhinitis Cardiovascular AFIB Objective Constitutional Hypertensive, asymptomatic. No acute distress.. Vitals Time Taken: 8:28 AM, Height: 76 in, Weight: 322 lbs, BMI: 39.2, Temperature: 97.8 F, Pulse: 85 bpm, Respiratory Rate: 20 breaths/min, Blood Pressure: 165/112 mmHg. Cardiovascular 3+ pitting edema  bilaterally. General Notes: 04/04/2022: He has developed a new wound on his right anterior tibial surface. This is fairly small and limited to breakdown of skin. There is a small amount of slough present. The wounds on his left lower leg do not look at all improved. There is slough accumulation on a couple of the deeper spots. He also has an area on his dorsal foot and anterior ankle that look like they are threatening to breakdown; this is where he had cut his wrap back to so that they were rubbing right in this location. Integumentary (Hair, Skin) Wound #1 status is Open. Original cause of wound was Blister. The date acquired was: 09/25/2021. The wound has been in treatment 5 weeks. The wound is located on the Anterior Lower Leg. The wound measures 9cm length x 8cm width x 0.1cm depth; 56.549cm^2 area and 5.655cm^3 volume. There is Fat Layer (Subcutaneous Tissue) exposed. There is no tunneling or undermining noted. There is a large amount of serous drainage noted. There is medium (34-66%) granulation within the wound bed. There is a medium (34-66%) amount of necrotic tissue within the wound bed including Adherent Slough. Wound #2 status is Open. Original cause of wound was Blister. The date acquired was: 04/04/2022. The wound is located on the Right,Anterior Lower Leg. The wound measures 1cm length x 0.6cm width x 0.1cm depth; 0.471cm^2 area and 0.047cm^3 volume. There is Fat Layer (Subcutaneous Tissue) exposed. There is no tunneling or undermining noted. There is medium (34-66%) pink granulation within the wound bed. There is a medium (34-66%) amount of necrotic tissue within the wound bed including Adherent Slough. 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 There was a Selective/Open Wound Non-Viable Tissue Debridement with a total area of 72 sq cm performed by Clinton Maudlin, MD. With the following instrument(s): Curette to remove Non-Viable tissue/material. Material removed includes [facility] after achieving pain control using Lidocaine 5% topical ointment. No specimens were taken. A time out was conducted at 08:42, prior to the start of the procedure. A Minimum amount of bleeding was controlled with  Pressure. The procedure was tolerated well with a pain level of 0 throughout and a pain level of 0 following the procedure. Post Debridement Measurements: 9cm length x 8cm width x 0.1cm depth; 5.655cm^3 volume. Character of Wound/Ulcer Post Debridement requires further debridement. Severity of Tissue Post Debridement is: Fat layer exposed. Post procedure Diagnosis Wound #1: Same as Pre-Procedure General Notes: Scribed for Dr. Celine Ahr by Blanche East, RN. Wound #2 Pre-procedure diagnosis of Wound #2 is a Lymphedema located on the Right,Anterior Lower Leg . There was a Selective/Open Wound Non-Viable Tissue Debridement with a total area of 0.6 sq cm performed by Clinton Maudlin, MD. With the following instrument(s): Curette to remove Non-Viable tissue/material. Material removed includes [facility] after achieving pain control using Lidocaine 5% topical ointment. No specimens were taken. A time out was conducted at 08:42, prior to the start of the procedure. A Minimum amount of bleeding was controlled with Pressure. The procedure was tolerated well with a pain level of 0 throughout and a pain level of 0  following the procedure. Post Debridement Measurements: 1cm length x 0.6cm width x 0.1cm depth; 0.047cm^3 volume. Character of Wound/Ulcer Post Debridement requires further debridement. Post procedure Diagnosis Wound #2: Same as Pre-Procedure General Notes: Scribed for Dr. Celine Ahr by Blanche East, RN. Plan Follow-up Appointments: Return Appointment in 1 week. - Dr. Alvera Singh 4 Nurse Visit: - Room Anesthetic: Wound #1 Anterior Lower Leg: (In clinic) Topical Lidocaine 5% applied to wound bed - in clinic, prior to debridement Bathing/ Shower/ Hygiene: May shower with protection but do not get wound dressing(s) wet. - use cast protector on Left lower leg Edema Control - Lymphedema / SCD / Other: Avoid standing for long periods of time. Patient to wear own compression stockings every day. - wear compression stockings Right leg WOUND #1: - Lower Leg Wound Laterality: Anterior Cleanser: Soap and Water Discharge Instructions: May shower and wash wound with dial antibacterial soap and water prior to dressing change. Peri-Wound Care: Sween Lotion (Moisturizing lotion) Discharge Instructions: Apply moisturizing lotion as directed Topical: Triamcinolone Discharge Instructions: Apply Triamcinolone as directed Prim Dressing: KerraCel Ag Gelling Fiber Dressing, 4x5 in (silver alginate) ary Discharge Instructions: Apply silver alginate to wound bed as instructed Secondary Dressing: Zetuvit Plus Silicone Border Dressing 4x4 (in/in) Discharge Instructions: Apply silicone border over primary dressing as directed. Secured With: 8M Medipore Public affairs consultant Surgical T 2x10 (in/yd) ape Discharge Instructions: Secure with tape as directed. Com pression Wrap: ThreePress (3 layer compression wrap) Discharge Instructions: Apply three layer compression as directed. Com pression Wrap: Unnaboot w/Calamine, 4x10 (in/yd) Discharge Instructions: Apply Unnaboot as directed. WOUND #2: - Lower Leg Wound Laterality: Right,  Anterior Cleanser: Soap and Water Discharge Instructions: May shower and wash wound with dial antibacterial soap and water prior to dressing change. Peri-Wound Care: Sween Lotion (Moisturizing lotion) Discharge Instructions: Apply moisturizing lotion as directed Topical: Triamcinolone Discharge Instructions: Apply Triamcinolone as directed Prim Dressing: KerraCel Ag Gelling Fiber Dressing, 4x5 in (silver alginate) ary Discharge Instructions: Apply silver alginate to wound bed as instructed Secondary Dressing: Zetuvit Plus Silicone Border Dressing 4x4 (in/in) Discharge Instructions: Apply silicone border over primary dressing as directed. Secured With: 8M Medipore Public affairs consultant Surgical T 2x10 (in/yd) ape Discharge Instructions: Secure with tape as directed. Com pression Wrap: ThreePress (3 layer compression wrap) Discharge Instructions: Apply three layer compression as directed. Com pression Wrap: Unnaboot w/Calamine, 4x10 (in/yd) Discharge Instructions: Apply Unnaboot as directed. 04/04/2022: He has developed a new wound on his right anterior tibial surface. This is  fairly small and limited to breakdown of skin. There is a small amount of slough present. The wounds on his left lower leg do not look at all improved. There is slough accumulation on a couple of the deeper spots. He also has an area on his dorsal foot and anterior ankle that look like they are threatening to breakdown; this is where he had cut his wrap back to so that they were rubbing right in this location. I used a curette to debride slough from the wounds bilaterally. I discussed with him that he will continue to have further breakdown and lack of forward progress with his wounds if he continues to disregard our instructions and make modifications to his wraps. He has a number of excuses that he offers, but fundamentally he is being noncompliant. Follow-up in 1 week. Electronic Signature(s) Signed: 04/04/2022 9:25:23 AM By:  Clinton Maudlin MD FACS Previous Signature: 04/04/2022 9:20:34 AM Version By: Clinton Maudlin MD FACS Entered By: [name] on 04/04/2022 09:25:23 -------------------------------------------------------------------------------- HxROS Details Patient Name: Date of Service: [name] RD O. 04/04/2022 8:00 A M Medical Record Number: [id number] Patient Account Number: [id number] Date of Birth/Sex: Treating RN: 1955/12/20 (66 y.o. M) Primary Care Provider: [name] Other Clinician: Referring Provider: Treating Provider/Extender: [name] in Treatment: 5 Information Obtained From Patient Eyes Medical History: Positive for: Cataracts - 2021 Ear/Nose/Mouth/Throat Medical History: Negative for: Chronic sinus problems/congestion; Middle ear problems Hematologic/Lymphatic Medical History: Positive for: Lymphedema Respiratory Medical History: Positive for: Sleep Apnea Past Medical History Notes: Allergic Rhinitis Cardiovascular Medical History: Positive for: Congestive Heart Failure; Coronary Artery Disease; Hypertension Negative for: Deep Vein Thrombosis Past Medical History Notes: AFIB Gastrointestinal Medical History: Negative for: Cirrhosis ; Colitis; Crohns Endocrine Medical History: Negative for: Type I Diabetes; Type II Diabetes Genitourinary Medical History: Negative for: End Stage Renal Disease Immunological Medical History: Negative for: Raynauds; Scleroderma Integumentary (Skin) Medical History: Negative for: History of Burn Musculoskeletal Medical History: Negative for: Gout; Rheumatoid Arthritis; Osteoarthritis; Osteomyelitis Neurologic Medical History: Negative for: Dementia; Neuropathy; Quadriplegia; Paraplegia; Seizure Disorder Oncologic Medical History: Negative for: Received Chemotherapy; Received Radiation Psychiatric Medical History: Positive for: Confinement Anxiety Negative for:  Anorexia/bulimia HBO Extended History Items Eyes: Cataracts Immunizations Pneumococcal Vaccine: Received Pneumococcal Vaccination: Yes Received Pneumococcal Vaccination On or After 60th Birthday: Yes Implantable Devices None Hospitalization / Surgery History Type of Hospitalization/Surgery Inguinal Hernia repair- 2014 insertion of mesh-2014 Atrial ablation surgery- 2007 BIla eyes lasik- 2008 hernia repair- 1977 Family and Social History Cancer: Yes - Father,Mother; Diabetes: No; Heart Disease: No; Hereditary Spherocytosis: No; Hypertension: Yes - Mother,Father; Kidney Disease: No; Lung Disease: No; Seizures: No; Thyroid Problems: No; Tuberculosis: No; Never smoker; Marital Status - Married; Alcohol Use: Never; Drug Use: No History; Caffeine Use: Daily - [employer] dew; Financial Concerns: No; Food, Clothing or Shelter Needs: No; Transportation Concerns: No Engineer, maintenance) Signed: 04/04/2022 12:16:21 PM By: Clinton Maudlin MD FACS Signed: 04/04/2022 12:16:21 PM By: Clinton Maudlin MD FACS Entered By: [name] on 04/04/2022 09:15:53 -------------------------------------------------------------------------------- [place] Details Patient Name: Date of Service: [name] RD O. 04/04/2022 Medical Record Number: [id number] Patient Account Number: [id number] Date of Birth/Sex: Treating RN: August 11, 1955 (66 y.o. M) Primary Care Provider: [name] Other Clinician: Referring Provider: Treating Provider/Extender: [name] in Treatment: 5 Diagnosis Coding ICD-10 Codes Code Description [phone] Non-pressure chronic ulcer of other part of left lower leg with fat layer exposed R60.0 Localized edema Z36.64 Chronic systolic (congestive) heart failure I42.0 Dilated  cardiomyopathy E66.01 Morbid (severe) obesity due to excess calories I10 Essential (primary) hypertension L97.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 other part of left lower leg  limited to breakdown of skin L97.811 Non-pressure chronic ulcer of other part of right lower leg limited to breakdown of skin Facility Procedures CPT4 Code: 29562130 Description: [phone] - DEBRIDE WOUND 1ST 20 SQ CM OR < ICD-10 Diagnosis Description 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d L97.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 other part of left lower leg limited to  breakdown o L97.811 Non-pressure chronic ulcer of other part of right lower leg limited to breakdown Modifier: f skin of skin Quantity: 1 CPT4 Code: [id number] Description: 84132 - DEBRIDE WOUND EA ADDL 20 SQ CM ICD-10 Diagnosis Description 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d L97.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 other part of left lower leg limited to  breakdown o L97.811 Non-pressure chronic ulcer of other part of right lower leg limited to breakdown Modifier: f skin of skin Quantity: 3 Physician Procedures : CPT4 Code Description Modifier [id number] - WC PHYS LEVEL 3 - EST PT 25 ICD-10 Diagnosis Description 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d L97.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 other part of left  lower leg limited to breakdown of skin L97.811 Non-pressure chronic ulcer of other part of right lower leg limited to breakdown of skin Y40.34 Chronic systolic (congestive) heart failure Quantity: 1 : [id number] - WC PHYS DEBR WO ANESTH 20 SQ CM ICD-10 Diagnosis Description 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d L97.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 other part of left lower leg limited to  breakdown of skin L97.811 Non-pressure chronic ulcer of other part of right lower leg limited to breakdown of skin Quantity: 1 Electronic Signature(s) Signed: 04/04/2022 9:26:55 AM By: Clinton Maudlin MD FACS Entered By: [name] on  04/04/2022 09:26:55

## 2022-04-07 ENCOUNTER — Encounter (HOSPITAL_BASED_OUTPATIENT_CLINIC_OR_DEPARTMENT_OTHER): Payer: 59 | Attending: General Surgery | Admitting: General Surgery

## 2022-04-07 DIAGNOSIS — I420 Dilated cardiomyopathy: Secondary | ICD-10-CM | POA: Insufficient documentation

## 2022-04-07 DIAGNOSIS — L97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 other part of left lower leg limited to breakdown of skin: Secondary | ICD-10-CM | POA: Diagnosis not present

## 2022-04-07 DIAGNOSIS — I509 Heart failure, unspecified: Secondary | ICD-10-CM | POA: Diagnosis not present

## 2022-04-07 DIAGNOSIS — L97822 Non-pressure chronic ulcer of other part of left lower leg with fat layer exposed: Secondary | ICD-10-CM | POA: Diagnosis present

## 2022-04-07 DIAGNOSIS — L97811 Non-pressure chronic ulcer of other part of right lower leg limited to breakdown of skin: Secondary | ICD-10-CM | POA: Diagnosis not present

## 2022-04-07 DIAGNOSIS — I110 Hypertensive heart disease with heart failure: Secondary | ICD-10-CM | POA: Diagnosis not present

## 2022-04-07 DIAGNOSIS — R339 Retention of urine, unspecified: Secondary | ICD-10-CM | POA: Insufficient documentation

## 2022-04-07 DIAGNOSIS — Z6839 Body mass index (BMI) 39.0-39.9, adult: Secondary | ICD-10-CM | POA: Diagnosis not present

## 2022-04-07 DIAGNOSIS — R600 Localized edema: Secondary | ICD-10-CM | POA: Insufficient documentation

## 2022-04-07 NOTE — Progress Notes (Signed)
RICAHRD, SCHWAGER ([id number]) Visit Report for 04/07/2022 Arrival Information Details Patient Name: Date of Service: Clinton Liner RD O. 04/07/2022 7:30 A M Medical Record Number: [id number] Patient Account Number: [id number] Date of Birth/Sex: Treating RN: 04-13-1956 (66 y.o. [name] Primary Care Donaldo Teegarden: [name] Other Clinician: Referring Dashauna Heymann: Treating Marcianne Ozbun/Extender: [name] in Treatment: 6 Visit Information History Since Last Visit All ordered tests and consults were completed: Yes Patient Arrived: Ambulatory Added or deleted any medications: No Arrival Time: 07:35 Any new allergies or adverse reactions: No Accompanied By: self Had a fall or experienced change in No Transfer Assistance: None activities of daily living that may affect Patient Requires Transmission-Based Precautions: No risk of falls: Patient Has Alerts: No Signs or symptoms of abuse/neglect since last visito No Hospitalized since last visit: No Implantable device outside of the clinic excluding No cellular tissue based products placed in the center since last visit: Has Dressing in Place as Prescribed: Yes Has Compression in Place as Prescribed: No Pain Present Now: No Electronic Signature(s) Signed: 04/07/2022 4:21:20 PM By: Blanche East RN Entered By: Blanche East on 04/07/2022 07:35:29 -------------------------------------------------------------------------------- Compression Therapy Details Patient Name: Date of Service: Clinton Liner RD O. 04/07/2022 7:30 A M Medical Record Number: [id number] Patient Account Number: [id number] Date of Birth/Sex: Treating RN: 06/17/1956 (66 y.o. [name] Primary Care Darran Gabay: [name] Other Clinician: Referring Girtrude Enslin: Treating Jak Haggar/Extender: [name] in Treatment: 6 Compression Therapy Performed for Wound Assessment: Wound #1 Anterior Lower Leg Performed  By: Clinician Blanche East, RN Compression Type: Three Hydrologist) Signed: 04/07/2022 4:21:20 PM By: Blanche East RN Entered By: Blanche East on 04/07/2022 07:58:01 -------------------------------------------------------------------------------- Encounter Discharge Information Details Patient Name: Date of Service: Clinton Liner RD O. 04/07/2022 7:30 A M Medical Record Number: [id number] Patient Account Number: [id number] Date of Birth/Sex: Treating RN: 02/04/1956 (66 y.o. [name] Primary Care Lyzbeth Genrich: [name] Other Clinician: Referring Neriyah Cercone: Treating Juaquina Machnik/Extender: [name] in Treatment: 6 Encounter Discharge Information Items Discharge Condition: Stable Ambulatory Status: Ambulatory Discharge Destination: Home Transportation: Private Auto Accompanied By: self Schedule Follow-up Appointment: Yes Clinical Summary of Care: Electronic Signature(s) Signed: 04/07/2022 4:21:20 PM By: Blanche East RN Entered By: Blanche East on 04/07/2022 07:58:41 -------------------------------------------------------------------------------- Patient/Caregiver Education Details Patient Name: Date of Service: Clinton Liner RD Jenetta Downer 10/2/2023andnbsp7:30 A M Medical Record Number: [id number] Patient Account Number: [id number] Date of Birth/Gender: Treating RN: 1955-12-25 (66 y.o. [name] Primary Care Physician: [name] Other Clinician: Referring Physician: Treating Physician/Extender: [name] in Treatment: 6 Education Assessment Education Provided To: Patient Education Topics Provided Nutrition: Methods: Explain/Verbal Responses: Reinforcements needed Wound/Skin Impairment: Methods: Explain/Verbal Responses: Reinforcements needed, State content correctly Electronic Signature(s) Signed: 04/07/2022 4:21:20 PM By: Blanche East RN Entered By: Blanche East on 04/07/2022  07:58:26 -------------------------------------------------------------------------------- Wound Assessment Details Patient Name: Date of Service: Clinton Liner RD O. 04/07/2022 7:30 A M Medical Record Number: [id number] Patient Account Number: [id number] Date of Birth/Sex: Treating RN: 15-Jun-1956 (66 y.o. [name] Primary Care Mera Gunkel: [name] Other Clinician: Referring Judson Tsan: Treating Larrisa Cravey/Extender: [name] in Treatment: 6 Wound Status Wound Number: 1 Primary Venous Leg Ulcer Etiology: Wound Location: Anterior Lower Leg Wound Open Wounding Event: Blister Status: Date Acquired: 09/25/2021 Comorbid Cataracts, Lymphedema, Sleep Apnea, Congestive Heart Failure, Weeks Of Treatment: 6 History: Coronary Artery Disease, Hypertension, Confinement Anxiety Clustered Wound: Yes Wound Measurements  Length: (cm) 9 Width: (cm) 8 Depth: (cm) 0.1 Area: (cm) 56.549 Volume: (cm) 5.655 % Reduction in Area: -1024.9% % Reduction in Volume: -1024.3% Epithelialization: None Tunneling: No Undermining: No Wound Description Classification: Full Thickness Without Exposed Support Structures Exudate Amount: Large Exudate Type: Serous Exudate Color: amber Foul Odor After Cleansing: No Slough/Fibrino Yes Wound Bed Granulation Amount: Medium (34-66%) Exposed Structure Necrotic Amount: Medium (34-66%) Fat Layer (Subcutaneous Tissue) Exposed: Yes Necrotic Quality: Adherent Slough Treatment Notes Wound #1 (Lower Leg) Wound Laterality: Anterior Cleanser Soap and Water Discharge Instruction: May shower and wash wound with dial antibacterial soap and water prior to dressing change. Peri-Wound Care Sween Lotion (Moisturizing lotion) Discharge Instruction: Apply moisturizing lotion as directed Topical Triamcinolone Discharge Instruction: Apply Triamcinolone as directed Primary Dressing KerraCel Ag Gelling Fiber Dressing, 4x5 in (silver  alginate) Discharge Instruction: Apply silver alginate to wound bed as instructed Secondary Dressing Zetuvit Plus Silicone Border Dressing 4x4 (in/in) Discharge Instruction: Apply silicone border over primary dressing as directed. Secured With [employer] Surgical T 2x10 (in/yd) ape Discharge Instruction: Secure with tape as directed. Compression Wrap ThreePress (3 layer compression wrap) Discharge Instruction: Apply three layer compression as directed. Compression Stockings Add-Ons Electronic Signature(s) Signed: 04/07/2022 4:21:20 PM By: Blanche East RN Entered By: Blanche East on 04/07/2022 07:38:28 -------------------------------------------------------------------------------- Wound Assessment Details Patient Name: Date of Service: Clinton Liner RD O. 04/07/2022 7:30 A M Medical Record Number: [id number] Patient Account Number: [id number] Date of Birth/Sex: Treating RN: Feb 14, 1956 (66 y.o. [name] Primary Care Karey Suthers: [name] Other Clinician: Referring Cadell Gabrielson: Treating Dewel Lotter/Extender: [name] in Treatment: 6 Wound Status Wound Number: 2 Primary Lymphedema Etiology: Wound Location: Right, Anterior Lower Leg Wound Open Wounding Event: Blister Status: Date Acquired: 04/04/2022 Comorbid Cataracts, Lymphedema, Sleep Apnea, Congestive Heart Failure, Weeks Of Treatment: 0 History: Coronary Artery Disease, Hypertension, Confinement Anxiety Clustered Wound: No Wound Measurements Length: (cm) 1 Width: (cm) 0.6 Depth: (cm) 0.1 Area: (cm) 0.471 Volume: (cm) 0.047 % Reduction in Area: 0% % Reduction in Volume: 0% Epithelialization: Small (1-33%) Tunneling: No Wound Description Classification: Full Thickness Without Exposed Support Structures Foul Odor After Cleansing: No Slough/Fibrino Yes Wound Bed Granulation Amount: Medium (34-66%) Exposed Structure Granulation Quality: Pink Fat Layer  (Subcutaneous Tissue) Exposed: Yes Necrotic Amount: Medium (34-66%) Necrotic Quality: Adherent Slough Treatment Notes Wound #2 (Lower Leg) Wound Laterality: Right, Anterior Cleanser Soap and Water Discharge Instruction: May shower and wash wound with dial antibacterial soap and water prior to dressing change. Peri-Wound Care Sween Lotion (Moisturizing lotion) Discharge Instruction: Apply moisturizing lotion as directed Topical Triamcinolone Discharge Instruction: Apply Triamcinolone as directed Primary Dressing KerraCel Ag Gelling Fiber Dressing, 4x5 in (silver alginate) Discharge Instruction: Apply silver alginate to wound bed as instructed Secondary Dressing Zetuvit Plus Silicone Border Dressing 4x4 (in/in) Discharge Instruction: Apply silicone border over primary dressing as directed. Secured With [employer] Surgical T 2x10 (in/yd) ape Discharge Instruction: Secure with tape as directed. Compression Wrap Compression stockings (from home) Compression Stockings Add-Ons Electronic Signature(s) Signed: 04/07/2022 4:21:20 PM By: Blanche East RN Entered By: Blanche East on 04/07/2022 07:38:36 -------------------------------------------------------------------------------- Vitals Details Patient Name: Date of Service: Clinton Liner RD O. 04/07/2022 7:30 A M Medical Record Number: [id number] Patient Account Number: [id number] Date of Birth/Sex: Treating RN: March 16, 1956 (66 y.o. [name] Primary Care Eliabeth Shoff: [name] Other Clinician: Referring Mariaeduarda Defranco: Treating Kimmarie Pascale/Extender: [name] in Treatment: 6 Vital Signs Time Taken: 07:35 Temperature (F): 97.9 Height (  in): 76 Pulse (bpm): 88 Weight (lbs): 322 Respiratory Rate (breaths/min): 18 Body Mass Index (BMI): 39.2 Blood Pressure (mmHg): 143/82 Reference Range: 80 - 120 mg / dl Electronic Signature(s) Signed: 04/07/2022 4:21:20 PM By: Blanche East  RN Entered By: Blanche East on 04/07/2022 07:38:00

## 2022-04-07 NOTE — Progress Notes (Signed)
VIRGILIO, BROADHEAD ([id number]) Visit Report for 04/07/2022 SuperBill Details Patient Name: Date of Service: [facility] RD O. 04/07/2022 Medical Record Number: [id number] Patient Account Number: [id number] Date of Birth/Sex: Treating RN: July 03, 1956 (66 y.o. [name] Primary Care Provider: [name] Other Clinician: Referring Provider: Treating Provider/Extender: [name] in Treatment: 6 Diagnosis Coding ICD-10 Codes Code Description [phone] Non-pressure chronic ulcer of other part of left lower leg with fat layer exposed R60.0 Localized edema O03.55 Chronic systolic (congestive) heart failure I42.0 Dilated cardiomyopathy E66.01 Morbid (severe) obesity due to excess calories I10 Essential (primary) hypertension L97.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 other part of left lower leg limited to breakdown of skin L97.811 Non-pressure chronic ulcer of other part of right lower leg limited to breakdown of skin Facility Procedures CPT4 Code Description Modifier Quantity [phone] (Facility Use Only) [phone] - APPLY MULTLAY COMPRS LWR LT LEG 25 1 ICD-10 Diagnosis Description 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d Electronic Signature(s) Signed: 04/07/2022 9:17:55 AM By: Fredirick Maudlin MD FACS Signed: 04/07/2022 4:21:20 PM By: Blanche East RN Entered By: Blanche East on 04/07/2022 07:59:15

## 2022-04-09 ENCOUNTER — Telehealth: Payer: Self-pay

## 2022-04-09 NOTE — Telephone Encounter (Signed)
I called the patient wife and LVM on her phone to have her husband to call me back. The patient voicemail is full.

## 2022-04-14 ENCOUNTER — Encounter (HOSPITAL_BASED_OUTPATIENT_CLINIC_OR_DEPARTMENT_OTHER): Payer: 59 | Admitting: General Surgery

## 2022-04-14 DIAGNOSIS — L97822 Non-pressure chronic ulcer of other part of left lower leg with fat layer exposed: Secondary | ICD-10-CM | POA: Diagnosis not present

## 2022-04-14 NOTE — Progress Notes (Signed)
DAKARAI, MCGLOCKLIN ([id number]) [id number] Page 1 of 10 Visit Report for 04/14/2022 Chief Complaint Document Details Patient Name: Date of Service: [name] RD O. 04/14/2022 9:30 A M Medical Record Number: [id number] Patient Account Number: [id number] Date of Birth/Sex: Treating RN: 02/11/1956 (66 y.o. M) Primary Care Provider: [name] Other Clinician: Referring Provider: Treating Provider/Extender: [name] in Treatment: 7 Information Obtained from: Patient Chief Complaint Patient presents for treatment of an open ulcer due to venous insufficiency Electronic Signature(s) Signed: 04/14/2022 9:54:54 AM By: Fredirick Maudlin MD FACS Entered By: Fredirick Maudlin on 04/14/2022 09:54:54 -------------------------------------------------------------------------------- Debridement Details Patient Name: Date of Service: [name] RD O. 04/14/2022 9:30 A M Medical Record Number: [id number] Patient Account Number: [id number] Date of Birth/Sex: Treating RN: August 17, 1955 (66 y.o. [name] Primary Care Provider: [name] Other Clinician: Referring Provider: Treating Provider/Extender: [name] in Treatment: 7 Debridement Performed for Assessment: Wound #1 Anterior Lower Leg Performed By: Physician Fredirick Maudlin, MD Debridement Type: Debridement Severity of Tissue Pre Debridement: Fat layer exposed Level of Consciousness (Pre-procedure): Awake and Alert Pre-procedure Verification/Time Out Yes - 09:48 Taken: Start Time: 09:48 Pain Control: Lidocaine 4% T opical Solution T Area Debrided (L x W): otal 8.5 (cm) x 2 (cm) = 17 (cm) Tissue and other material debrided: Non-Viable, Slough, Slough Level: Non-Viable Tissue Debridement Description: Selective/Open Wound Instrument: Curette Bleeding: Minimum Hemostasis Achieved: Pressure End Time: 09:49 Procedural Pain: 0 Post  Procedural Pain: 0 Response to Treatment: Procedure was tolerated well Level of Consciousness (Post- Awake and Alert procedure): Post Debridement Measurements of Total Wound Length: (cm) 8.5 Width: (cm) 2 Depth: (cm) 0.1 Volume: (cm) 1.335 Character of Wound/Ulcer Post Debridement: Improved Severity of Tissue Post Debridement: Fat layer exposed ROYALE, SWAMY ([id number]) [id number] Page 2 of 10 Post Procedure Diagnosis Same as Pre-procedure Notes Scribed for Dr. Celine Ahr by J.Scotton Electronic Signature(s) Signed: 04/14/2022 10:14:13 AM By: Fredirick Maudlin MD FACS Signed: 04/14/2022 5:11:32 PM By: Dellie Catholic RN Entered By: Dellie Catholic on 04/14/2022 09:52:36 -------------------------------------------------------------------------------- HPI Details Patient Name: Date of Service: [name] RD O. 04/14/2022 9:30 A M Medical Record Number: [id number] Patient Account Number: [id number] Date of Birth/Sex: Treating RN: 07-Sep-1955 (66 y.o. M) Primary Care Provider: [name] Other Clinician: Referring Provider: Treating Provider/Extender: [name] in Treatment: 7 History of Present Illness HPI Description: ADMISSION 02/24/2022 This is a 66 year old man with a past medical history significant for congestive heart failure, morbid obesity, dilated cardiomyopathy, atrial fibrillation, and lower extremity cellulitis. He is currently being treated with Keflex. He is not diabetic. He does not smoke. He says that he developed some blisters on his left anterior tibial surface which subsequently broke open causing the wounds for which she is here to see [place] today. He says that he occasionally wears compression stockings but does not do so on a regular basis. He reports that "they look stupid with shorts." ABI in clinic today was 0.94. On the left anterior tibial surface, there are several small wounds in a geographic  pattern. They do appear consistent with blisters that have ruptured. The fat layer is exposed and there is a thin layer of slough on the surfaces. He does have some surrounding erythema, but no purulent drainage. Edema control is very poor. 03/03/2022: The patient came in for a nurse visit last week because he thought the compression wraps were too tight. They were reapplied and apparently he did not understand  the instruction to try to stay off his feet is much as possible and keep his leg elevated; he instead walked excessively on both Friday and Saturday. As result his leg became more swollen, the wrap became uncomfortable and he cut it off yesterday. He replaced it with a wrap of his own fashion. Today he has an indentation in his leg where his homemade wrap ended. He has extensive 3+ nonpitting edema above this. The wounds themselves are in fairly decent condition with just a little bit of slough on the surface, but edema fluid is frankly pouring out of the largest wound. 03/14/2022: Significant improvement in his wound. He has come in a couple of times for nurse visits due to drainage. Today the wound is quite a bit smaller and just has a layer of eschar and slough on the surface. Edema control is better. 03/23/2022: Ongoing improvement in his wound. His edema control is suboptimal today, however, because his wrap slid down. There is also evidence of excoriation suggesting that perhaps he was scratching under the wrap and it slid. There is a little bit of eschar and slough on the wound surface. 03/28/2022: The patient has been fairly noncompliant with his compression wraps. As result, he has opened a new area of wounding on his anterior tibial surface just proximal to the original site. It is limited breakdown of skin. The original site is smaller and has a little slough buildup. He did see his cardiologist this week and is now taking torsemide twice a day. 04/04/2022: The patient continues to be  fairly noncompliant with his care recommendations. He fails to show up for his venous reflux studies on Monday. He continues to cut his wraps off. Although he is supposed to be taking torsemide twice a day, the degree of edema in his legs suggest that perhaps he is not being compliant with his medication, as well. His blood pressure was elevated today and he said that he did not take his antihypertensives. He claims that the methocarbamol and tizanidine he was prescribed for muscle spasms are responsible for his leg swelling. He has developed a new wound on his right anterior tibial surface. This is fairly small and limited to breakdown of skin. There is a small amount of slough present. The wounds on his left lower leg do not look at all improved. There is slough accumulation on a couple of the deeper spots. He also has an area on his dorsal foot and anterior ankle that look like they are threatening to breakdown; this is where he had cut his wrap back to so that they were rubbing right in this location. 04/14/2022: The right leg has healed and there has been substantial improvement to the wounds on the left. Electronic Signature(s) Signed: 04/14/2022 9:55:32 AM By: Fredirick Maudlin MD FACS Entered By: Fredirick Maudlin on 04/14/2022 09:55:32 [name] ([id number]) [id number] Page 3 of 10 -------------------------------------------------------------------------------- Physical Exam Details Patient Name: Date of Service: [name] RD O. 04/14/2022 9:30 A M Medical Record Number: [id number] Patient Account Number: [id number] Date of Birth/Sex: Treating RN: January 21, 1956 (66 y.o. M) Primary Care Provider: [name] Other Clinician: Referring Provider: Treating Provider/Extender: [name] in Treatment: 7 Constitutional Hypertensive, asymptomatic. . . . No acute distress.Marland Kitchen Respiratory Normal work of breathing on room  air.. Notes 04/14/2022: The right leg has healed and there has been substantial improvement to the wounds on the left. Electronic Signature(s) Signed: 04/14/2022 9:57:12 AM By: Fredirick Maudlin MD FACS  Entered By: Fredirick Maudlin on 04/14/2022 09:57:12 -------------------------------------------------------------------------------- Physician Orders Details Patient Name: Date of Service: [name], [name] 04/14/2022 9:30 A M Medical Record Number: [id number] Patient Account Number: [id number] Date of Birth/Sex: Treating RN: 1956-02-18 (66 y.o. [name] Primary Care Provider: [name] Other Clinician: Referring Provider: Treating Provider/Extender: [name] in Treatment: 7 Verbal / Phone Orders: No Diagnosis Coding ICD-10 Coding Code Description R60.0 Localized edema S01.09 Chronic systolic (congestive) heart failure I42.0 Dilated cardiomyopathy E66.01 Morbid (severe) obesity due to excess calories I10 Essential (primary) hypertension L97.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 other part of left lower leg limited to breakdown of skin Follow-up Appointments ppointment in 1 week. - Dr. Celine Ahr Rm 3 Return A Anesthetic Wound #1 Anterior Lower Leg (In clinic) Topical Lidocaine 5% applied to wound bed - in clinic, prior to debridement (In clinic) Topical Lidocaine 4% applied to wound bed - Used in clinic Bathing/ Shower/ Hygiene May shower with protection but do not get wound dressing(s) wet. - use cast protector on Left lower leg Edema Control - Lymphedema / SCD / Other Avoid standing for long periods of time. Patient to wear own compression stockings every day. - wear compression stockings Right leg SERENITY, FORTNER ([id number]) [id number] Page 4 of 10 Wound Treatment Wound #1 - Lower Leg Wound Laterality: Anterior Cleanser: Soap and Water 1 x Per Week/30 Days Discharge Instructions: May shower and wash wound  with dial antibacterial soap and water prior to dressing change. Peri-Wound Care: Sween Lotion (Moisturizing lotion) 1 x Per Week/30 Days Discharge Instructions: Apply moisturizing lotion as directed Topical: Triamcinolone 1 x Per Week/30 Days Discharge Instructions: Apply Triamcinolone as directed Prim Dressing: KerraCel Ag Gelling Fiber Dressing, 4x5 in (silver alginate) 1 x Per Week/30 Days ary Discharge Instructions: Apply silver alginate to wound bed as instructed Secondary Dressing: Zetuvit Plus Silicone Border Dressing 4x4 (in/in) 1 x Per Week/30 Days Discharge Instructions: Apply silicone border over primary dressing as directed. Secured With: 80M Medipore Public affairs consultant Surgical T 2x10 (in/yd) 1 x Per Week/30 Days ape Discharge Instructions: Secure with tape as directed. Compression Wrap: ThreePress (3 layer compression wrap) 1 x Per Week/30 Days Discharge Instructions: Apply three layer compression as directed. Electronic Signature(s) Signed: 04/14/2022 10:14:13 AM By: Fredirick Maudlin MD FACS Entered By: Fredirick Maudlin on 04/14/2022 10:01:34 -------------------------------------------------------------------------------- Problem List Details Patient Name: Date of Service: [name] RD O. 04/14/2022 9:30 A M Medical Record Number: [id number] Patient Account Number: [id number] Date of Birth/Sex: Treating RN: February 12, 1956 (66 y.o. M) Primary Care Provider: [name] Other Clinician: Referring Provider: Treating Provider/Extender: [name] in Treatment: 7 Active Problems ICD-10 Encounter Code Description Active Date MDM Diagnosis R60.0 Localized edema 02/24/2022 No Yes B76.28 Chronic systolic (congestive) heart failure 02/24/2022 No Yes I42.0 Dilated cardiomyopathy 02/24/2022 No Yes E66.01 Morbid (severe) obesity due to excess calories 02/24/2022 No Yes I10 Essential (primary) hypertension 02/24/2022 No Yes L97.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  other part of left lower leg limited to breakdown 03/28/2022 No Yes of skin BRIGHTON, PILLEY ([id number]) [id number] Page 5 of 10 Inactive Problems ICD-10 Code Description Active Date Inactive Date 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d 02/24/2022 02/24/2022 L97.811 Non-pressure chronic ulcer of other part of right lower leg limited to breakdown of skin 04/04/2022 04/04/2022 Resolved Problems Electronic Signature(s) Signed: 04/14/2022 9:54:43 AM By: Fredirick Maudlin MD FACS Entered By: Fredirick Maudlin on 04/14/2022 09:54:43 --------------------------------------------------------------------------------  Progress Note Details Patient Name: Date of Service: [name], [name] 04/14/2022 9:30 A M Medical Record Number: [id number] Patient Account Number: [id number] Date of Birth/Sex: Treating RN: 1955/11/19 (66 y.o. M) Primary Care Provider: [name] Other Clinician: Referring Provider: Treating Provider/Extender: [name] in Treatment: 7 Subjective Chief Complaint Information obtained from Patient Patient presents for treatment of an open ulcer due to venous insufficiency History of Present Illness (HPI) ADMISSION 02/24/2022 This is a 66 year old man with a past medical history significant for congestive heart failure, morbid obesity, dilated cardiomyopathy, atrial fibrillation, and lower extremity cellulitis. He is currently being treated with Keflex. He is not diabetic. He does not smoke. He says that he developed some blisters on his left anterior tibial surface which subsequently broke open causing the wounds for which she is here to see [place] today. He says that he occasionally wears compression stockings but does not do so on a regular basis. He reports that "they look stupid with shorts." ABI in clinic today was 0.94. On the left anterior tibial surface, there are several  small wounds in a geographic pattern. They do appear consistent with blisters that have ruptured. The fat layer is exposed and there is a thin layer of slough on the surfaces. He does have some surrounding erythema, but no purulent drainage. Edema control is very poor. 03/03/2022: The patient came in for a nurse visit last week because he thought the compression wraps were too tight. They were reapplied and apparently he did not understand the instruction to try to stay off his feet is much as possible and keep his leg elevated; he instead walked excessively on both Friday and Saturday. As result his leg became more swollen, the wrap became uncomfortable and he cut it off yesterday. He replaced it with a wrap of his own fashion. Today he has an indentation in his leg where his homemade wrap ended. He has extensive 3+ nonpitting edema above this. The wounds themselves are in fairly decent condition with just a little bit of slough on the surface, but edema fluid is frankly pouring out of the largest wound. 03/14/2022: Significant improvement in his wound. He has come in a couple of times for nurse visits due to drainage. Today the wound is quite a bit smaller and just has a layer of eschar and slough on the surface. Edema control is better. 03/23/2022: Ongoing improvement in his wound. His edema control is suboptimal today, however, because his wrap slid down. There is also evidence of excoriation suggesting that perhaps he was scratching under the wrap and it slid. There is a little bit of eschar and slough on the wound surface. 03/28/2022: The patient has been fairly noncompliant with his compression wraps. As result, he has opened a new area of wounding on his anterior tibial surface just proximal to the original site. It is limited breakdown of skin. The original site is smaller and has a little slough buildup. He did see his cardiologist this week and is now taking torsemide twice a day. 04/04/2022: The  patient continues to be fairly noncompliant with his care recommendations. He fails to show up for his venous reflux studies on Monday. He continues to cut his wraps off. Although he is supposed to be taking torsemide twice a day, the degree of edema in his legs suggest that perhaps he is not being compliant with his medication, as well. His blood pressure was elevated today and he said that he  did not take his antihypertensives. He claims that the methocarbamol and tizanidine he was prescribed for muscle spasms are responsible for his leg swelling. He has developed a new wound on his right anterior tibial surface. This is fairly small and limited to breakdown of skin. There is a small amount of slough present. The wounds on his left lower leg do not look at all improved. There is slough accumulation on a couple of the deeper spots. He also has an area on his dorsal foot and anterior ankle that look like they are threatening to breakdown; this is where he had cut his wrap back to so that they were rubbing right in this location. 04/14/2022: The right leg has healed and there has been substantial improvement to the wounds on the left. LAMARIO, MANI ([id number]) [id number] Page 6 of 10 Patient History Information obtained from Patient. Family History Cancer - Father,Mother, Hypertension - Mother,Father, No family history of Diabetes, Heart Disease, Hereditary Spherocytosis, Kidney Disease, Lung Disease, Seizures, Thyroid Problems, Tuberculosis. 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 Medical History Eyes Patient has history of Cataracts - 2021 Ear/Nose/Mouth/Throat Denies history of Chronic sinus problems/congestion, Middle ear problems Hematologic/Lymphatic Patient has history of Lymphedema Respiratory Patient has history of Sleep Apnea Cardiovascular Patient has history  of Congestive Heart Failure, Coronary Artery Disease, Hypertension Denies history of Deep Vein Thrombosis Gastrointestinal Denies history of Cirrhosis , Colitis, Crohnoos Endocrine Denies history of Type I Diabetes, Type II Diabetes Genitourinary Denies history of End Stage Renal Disease Immunological Denies history of Raynaudoos, Scleroderma Integumentary (Skin) Denies history of History of Burn Musculoskeletal Denies history of Gout, Rheumatoid Arthritis, Osteoarthritis, Osteomyelitis Neurologic Denies history of Dementia, Neuropathy, Quadriplegia, Paraplegia, Seizure Disorder Oncologic Denies history of Received Chemotherapy, Received Radiation Psychiatric Patient has history of Confinement Anxiety Denies history of Anorexia/bulimia Hospitalization/Surgery History - Inguinal Hernia repair- 2014. - insertion of mesh-2014. - Atrial ablation surgery- 2007. - BIla eyes lasik- 2008. - hernia repair- 1977. Medical A Surgical History Notes nd Respiratory Allergic Rhinitis Cardiovascular AFIB Objective Constitutional Hypertensive, asymptomatic. No acute distress.. Vitals Time Taken: 9:29 AM, Height: 76 in, Weight: 322 lbs, BMI: 39.2, Temperature: 97.9 F, Pulse: 89 bpm, Respiratory Rate: 20 breaths/min, Blood Pressure: 180/100 mmHg. Respiratory Normal work of breathing on room air.. General Notes: 04/14/2022: The right leg has healed and there has been substantial improvement to the wounds on the left. Integumentary (Hair, Skin) Wound #1 status is Open. Original cause of wound was Blister. The date acquired was: 09/25/2021. The wound has been in treatment 7 weeks. The wound is located on the Anterior Lower Leg. The wound measures 8.5cm length x 2cm width x 0.1cm depth; 13.352cm^2 area and 1.335cm^3 volume. There is no tunneling or undermining noted. There is a large amount of serous drainage noted. There is large (67-100%) red granulation within the wound bed. There is a small  (1-33%) amount of necrotic tissue within the wound bed including Adherent Slough. The periwound skin appearance exhibited: Maceration, Hemosiderin Staining. Periwound temperature was noted as No Abnormality. General Notes: 3 clustered wounds Wound #2 status is Healed - Epithelialized. Original cause of wound was Blister. The date acquired was: 04/04/2022. The wound has been in treatment 1 weeks. The wound is located on the Right,Anterior Lower Leg. The wound measures 0cm length x 0cm width x 0cm depth; 0cm^2 area and 0cm^3 volume. There is no tunneling or undermining noted.  There is a none present amount of drainage noted. There is no granulation within the wound bed. There is no necrotic tissue within the wound bed. The periwound skin appearance had no abnormalities noted for texture. The periwound skin appearance exhibited: Hemosiderin Staining. RIKKI, SMESTAD ([id number]) [id number] Page 7 of 10 Assessment Active Problems ICD-10 Localized edema Chronic systolic (congestive) heart failure Dilated cardiomyopathy Morbid (severe) obesity due to excess calories Essential (primary) hypertension Non-pressure chronic ulcer of other part of left lower leg limited to breakdown of skin Procedures Wound #1 Pre-procedure diagnosis of Wound #1 is a Venous Leg Ulcer located on the Anterior Lower Leg .Severity of Tissue Pre Debridement is: Fat layer exposed. There was a Selective/Open Wound Non-Viable Tissue Debridement with a total area of 17 sq cm performed by Fredirick Maudlin, MD. With the following instrument(s): Curette to remove Non-Viable tissue/material. Material removed includes [name] after achieving pain control using Lidocaine 4% Topical Solution. No specimens were taken. A time out was conducted at 09:48, prior to the start of the procedure. A Minimum amount of bleeding was controlled with Pressure. The procedure was tolerated well with a pain level of 0  throughout and a pain level of 0 following the procedure. Post Debridement Measurements: 8.5cm length x 2cm width x 0.1cm depth; 1.335cm^3 volume. Character of Wound/Ulcer Post Debridement is improved. Severity of Tissue Post Debridement is: Fat layer exposed. Post procedure Diagnosis Wound #1: Same as Pre-Procedure General Notes: Scribed for Dr. Celine Ahr by J.Scotton. Pre-procedure diagnosis of Wound #1 is a Venous Leg Ulcer located on the Anterior Lower Leg . There was a Three Layer Compression Therapy Procedure by Dellie Catholic, RN. Post procedure Diagnosis Wound #1: Same as Pre-Procedure Plan Follow-up Appointments: Return Appointment in 1 week. - Dr. Celine Ahr Rm 3 Anesthetic: Wound #1 Anterior Lower Leg: (In clinic) Topical Lidocaine 5% applied to wound bed - in clinic, prior to debridement (In clinic) Topical Lidocaine 4% applied to wound bed - Used in clinic Bathing/ Shower/ Hygiene: May shower with protection but do not get wound dressing(s) wet. - use cast protector on Left lower leg Edema Control - Lymphedema / SCD / Other: Avoid standing for long periods of time. Patient to wear own compression stockings every day. - wear compression stockings Right leg WOUND #1: - Lower Leg Wound Laterality: Anterior Cleanser: Soap and Water 1 x Per Week/30 Days Discharge Instructions: May shower and wash wound with dial antibacterial soap and water prior to dressing change. Peri-Wound Care: Sween Lotion (Moisturizing lotion) 1 x Per Week/30 Days Discharge Instructions: Apply moisturizing lotion as directed Topical: Triamcinolone 1 x Per Week/30 Days Discharge Instructions: Apply Triamcinolone as directed Prim Dressing: KerraCel Ag Gelling Fiber Dressing, 4x5 in (silver alginate) 1 x Per Week/30 Days ary Discharge Instructions: Apply silver alginate to wound bed as instructed Secondary Dressing: Zetuvit Plus Silicone Border Dressing 4x4 (in/in) 1 x Per Week/30 Days Discharge Instructions:  Apply silicone border over primary dressing as directed. Secured With: 78M Medipore Public affairs consultant Surgical T 2x10 (in/yd) 1 x Per Week/30 Days ape Discharge Instructions: Secure with tape as directed. Com pression Wrap: ThreePress (3 layer compression wrap) 1 x Per Week/30 Days Discharge Instructions: Apply three layer compression as directed. 04/14/2022: The right leg has healed and there has been substantial improvement to the wounds on the left. I used a curette to debride a little bit of slough off of the right lower leg wound. We will continue to apply silver alginate and 3 layer compression. Follow-up in  1 week. Electronic Signature(s) Signed: 04/15/2022 9:27:14 AM By: Fredirick Maudlin MD FACS Signed: 04/15/2022 4:47:04 PM By: Dellie Catholic RN Previous Signature: 04/14/2022 10:02:13 AM Version By: Fredirick Maudlin MD FACS Entered By: Dellie Catholic on 04/15/2022 09:22:55 [name] ([id number]) [id number] Page 8 of 10 -------------------------------------------------------------------------------- HxROS Details Patient Name: Date of Service: [name] RD O. 04/14/2022 9:30 A M Medical Record Number: [id number] Patient Account Number: [id number] Date of Birth/Sex: Treating RN: Dec 11, 1955 (66 y.o. M) Primary Care Provider: [name] Other Clinician: Referring Provider: Treating Provider/Extender: [name] in Treatment: 7 Information Obtained From Patient Eyes Medical History: Positive for: Cataracts - 2021 Ear/Nose/Mouth/Throat Medical History: Negative for: Chronic sinus problems/congestion; Middle ear problems Hematologic/Lymphatic Medical History: Positive for: Lymphedema Respiratory Medical History: Positive for: Sleep Apnea Past Medical History Notes: Allergic Rhinitis Cardiovascular Medical History: Positive for: Congestive Heart Failure; Coronary Artery Disease; Hypertension Negative for:  Deep Vein Thrombosis Past Medical History Notes: AFIB Gastrointestinal Medical History: Negative for: Cirrhosis ; Colitis; Crohns Endocrine Medical History: Negative for: Type I Diabetes; Type II Diabetes Genitourinary Medical History: Negative for: End Stage Renal Disease Immunological Medical History: Negative for: Raynauds; Scleroderma Integumentary (Skin) Medical History: Negative for: History of Burn Musculoskeletal Medical History: Negative for: Gout; Rheumatoid Arthritis; Osteoarthritis; Osteomyelitis CHARES, SLAYMAKER ([id number]) [id number] Page 9 of 10 Neurologic Medical History: Negative for: Dementia; Neuropathy; Quadriplegia; Paraplegia; Seizure Disorder Oncologic Medical History: Negative for: Received Chemotherapy; Received Radiation Psychiatric Medical History: Positive for: Confinement Anxiety Negative for: Anorexia/bulimia HBO Extended History Items Eyes: Cataracts Immunizations Pneumococcal Vaccine: Received Pneumococcal Vaccination: Yes Received Pneumococcal Vaccination On or After 60th Birthday: Yes Implantable Devices None Hospitalization / Surgery History Type of Hospitalization/Surgery Inguinal Hernia repair- 2014 insertion of mesh-2014 Atrial ablation surgery- 2007 BIla eyes lasik- 2008 hernia repair- 1977 Family and Social History Cancer: Yes - Father,Mother; Diabetes: No; Heart Disease: No; Hereditary Spherocytosis: No; Hypertension: Yes - Mother,Father; Kidney Disease: No; Lung Disease: No; Seizures: No; Thyroid Problems: No; Tuberculosis: No; Never smoker; Marital Status - Married; Alcohol Use: Never; Drug Use: No History; Caffeine Use: Daily - MT[employer] dew; Financial Concerns: No; Food, Clothing or Shelter Needs: No; Transportation Concerns: No Electronic Signature(s) Signed: 04/14/2022 10:14:13 AM By: Fredirick Maudlin MD FACS Entered By: Fredirick Maudlin on 04/14/2022  09:56:42 -------------------------------------------------------------------------------- SuperBill Details Patient Name: Date of Service: [name] RD O. 04/14/2022 Medical Record Number: [id number] Patient Account Number: [id number] Date of Birth/Sex: Treating RN: 12/19/1955 (66 y.o. M) Primary Care Provider: [name] Other Clinician: Referring Provider: Treating Provider/Extender: [name] in Treatment: 7 Diagnosis Coding ICD-10 Codes Code Description R60.0 Localized edema Y80.16 Chronic systolic (congestive) heart failure I42.0 Dilated cardiomyopathy E66.01 Morbid (severe) obesity due to excess calories AUDREY, ELLER ([id number]) [id number] Page 10 of 10 I10 Essential (primary) hypertension L97.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 other part of left lower leg limited to breakdown of skin Facility Procedures : CPT4 Code: [id number] Description: 20100 - DEBRIDE WOUND 1ST 20 SQ CM OR < ICD-10 Diagnosis Description L97.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 other part of left lower leg limited to breakdown o Modifier: f skin Quantity: 1 Physician Procedures : CPT4 Code Description Modifier [id number] - WC PHYS LEVEL 3 - EST PT 25 ICD-10 Diagnosis Description L97.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 other part of left lower leg limited to breakdown of skin R60.0 Localized edema O83.25 Chronic systolic  (congestive) heart failure E66.01 Morbid (severe) obesity due to excess calories Quantity: 1 : [id number] -  WC PHYS DEBR WO ANESTH 20 SQ CM ICD-10 Diagnosis Description L97.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 other part of left lower leg limited to breakdown of skin Quantity: 1 Electronic Signature(s) Signed: 04/14/2022 10:02:32 AM By: Fredirick Maudlin MD FACS Entered By: Fredirick Maudlin on 04/14/2022 10:02:32

## 2022-04-14 NOTE — Progress Notes (Signed)
ADRIAN, DINOVO ([id number]) [id number] Page 1 of 8 Visit Report for 04/14/2022 Arrival Information Details Patient Name: Date of Service: Clinton Liner RD O. 04/14/2022 9:30 A M Medical Record Number: [id number] Patient Account Number: [id number] Date of Birth/Sex: Treating RN: [birth year] (66 y.o. [name] Primary Care Kendell Sagraves: [name] Other Clinician: Referring Teryl Gubler: Treating Bud Kaeser/Extender: [name] in Treatment: 7 Visit Information History Since Last Visit Added or deleted any medications: No Patient Arrived: Ambulatory Any new allergies or adverse reactions: No Arrival Time: 09:28 Had a fall or experienced change in No Accompanied By: self activities of daily living that may affect Transfer Assistance: None risk of falls: Patient Identification Verified: Yes Signs or symptoms of abuse/neglect since last visito No Patient Requires Transmission-Based Precautions: No Hospitalized since last visit: No Patient Has Alerts: No Implantable device outside of the clinic excluding No cellular tissue based products placed in the center since last visit: Has Dressing in Place as Prescribed: No Pain Present Now: No Electronic Signature(s) Signed: 04/14/2022 5:11:32 PM By: Dellie Catholic RN Entered By: Dellie Catholic on 04/14/2022 09:36:37 -------------------------------------------------------------------------------- Encounter Discharge Information Details Patient Name: Date of Service: [place], [place]. 04/14/2022 9:30 A M Medical Record Number: [id number] Patient Account Number: [id number] Date of Birth/Sex: Treating RN: [birth year] (66 y.o. [name] Primary Care Natori Gudino: [name] Other Clinician: Referring Melina Mosteller: Treating Aneita Kiger/Extender: [name] in Treatment: 7 Encounter Discharge Information Items Post Procedure Vitals Discharge Condition:  Stable Temperature (F): 97.9 Ambulatory Status: Ambulatory Pulse (bpm): 89 Discharge Destination: Home Respiratory Rate (breaths/min): 20 Transportation: Private Auto Blood Pressure (mmHg): 180/100 Accompanied By: self Schedule Follow-up Appointment: Yes Clinical Summary of Care: Patient Declined Electronic Signature(s) Signed: 04/14/2022 5:11:32 PM By: Dellie Catholic RN Entered By: Dellie Catholic on 04/14/2022 10:16:44 [name] ([id number]) [id number] Page 2 of 8 -------------------------------------------------------------------------------- Lower Extremity Assessment Details Patient Name: Date of Service: [place], [place]. 04/14/2022 9:30 A M Medical Record Number: [id number] Patient Account Number: [id number] Date of Birth/Sex: Treating RN: [birth year] (66 y.o. [name] Primary Care Shaunda Tipping: [name] Other Clinician: Referring Bradey Luzier: Treating Junette Bernat/Extender: [name] in Treatment: 7 Edema Assessment Assessed: [Left: No] [Right: No] [Left: Edema] [Right: :] Calf Left: Right: Point of Measurement: 38 cm From Medial Instep 52 cm Ankle Left: Right: Point of Measurement: 11 cm From Medial Instep 34.5 cm Knee To Floor Left: Right: From Medial Instep 49 cm Electronic Signature(s) Signed: 04/14/2022 5:11:32 PM By: Dellie Catholic RN Entered By: Dellie Catholic on 04/14/2022 09:38:23 -------------------------------------------------------------------------------- Multi Wound Chart Details Patient Name: Date of Service: Clinton Liner RD O. 04/14/2022 9:30 A M Medical Record Number: [id number] Patient Account Number: [id number] Date of Birth/Sex: Treating RN: 10-Jul-1955 (66 y.o. M) Primary Care Jolee Critcher: [name] Other Clinician: Referring Ronte Parker: Treating Lekendrick Alpern/Extender: [name] in Treatment: 7 Vital Signs Height(in): 76 Pulse(bpm):  89 Weight(lbs): 263 Blood Pressure(mmHg): 180/100 Body Mass Index(BMI): 39.2 Temperature(F): 97.9 Respiratory Rate(breaths/min): 20 [1:Photos:] [N/A:N/A] Anterior Lower Leg Right, Anterior Lower Leg N/A Wound Location: [name], [name] ([id number]) [id number] Page 3 of 8 Blister Blister N/A Wounding Event: Venous Leg Ulcer Lymphedema N/A Primary Etiology: Cataracts, Lymphedema, Sleep Cataracts, Lymphedema, Sleep N/A Comorbid History: Apnea, Congestive Heart Failure, Apnea, Congestive Heart Failure, Coronary Artery Disease, Coronary Artery Disease, Hypertension, Confinement Anxiety Hypertension, Confinement Anxiety 09/25/2021 04/04/2022 N/A Date Acquired: 7 1 N/A Weeks of Treatment: Open Healed - Epithelialized N/A  Wound Status: No No N/A Wound Recurrence: Yes No N/A Clustered Wound: 8.5x2x0.1 0x0x0 N/A Measurements L x W x D (cm) 13.352 0 N/A A (cm) : rea 1.335 0 N/A Volume (cm) : -165.60% 100.00% N/A % Reduction in A rea: -165.40% 100.00% N/A % Reduction in Volume: Full Thickness Without Exposed Full Thickness Without Exposed N/A Classification: Support Structures Support Structures Large None Present N/A Exudate A mount: Serous N/A N/A Exudate Type: amber N/A N/A Exudate Color: Large (67-100%) None Present (0%) N/A Granulation A mount: Red N/A N/A Granulation Quality: Small (1-33%) None Present (0%) N/A Necrotic A mount: Fascia: No Fascia: No N/A Exposed Structures: Fat Layer (Subcutaneous Tissue): No Fat Layer (Subcutaneous Tissue): No Tendon: No Tendon: No Muscle: No Muscle: No Joint: No Joint: No Bone: No Bone: No Medium (34-66%) Large (67-100%) N/A Epithelialization: Debridement - Selective/Open Wound N/A N/A Debridement: Pre-procedure Verification/Time Out 09:48 N/A N/A Taken: Lidocaine 4% Topical Solution N/A N/A Pain Control: Slough N/A N/A Tissue Debrided: Non-Viable Tissue N/A N/A Level: 17 N/A  N/A Debridement A (sq cm): rea Curette N/A N/A Instrument: Minimum N/A N/A Bleeding: Pressure N/A N/A Hemostasis A chieved: 0 N/A N/A Procedural Pain: 0 N/A N/A Post Procedural Pain: Procedure was tolerated well N/A N/A Debridement Treatment Response: 8.5x2x0.1 N/A N/A Post Debridement Measurements L x W x D (cm) 1.335 N/A N/A Post Debridement Volume: (cm) No Abnormalities Noted N/A Periwound Skin Texture: Maceration: Yes N/A Periwound Skin Moisture: Hemosiderin Staining: Yes Hemosiderin Staining: Yes N/A Periwound Skin Color: No Abnormality N/A N/A Temperature: 3 clustered wounds N/A N/A Assessment Notes: Debridement N/A N/A Procedures Performed: Treatment Notes Electronic Signature(s) Signed: 04/14/2022 9:54:50 AM By: Fredirick Maudlin MD FACS Entered By: Fredirick Maudlin on 04/14/2022 09:54:49 -------------------------------------------------------------------------------- Multi-Disciplinary Care Plan Details Patient Name: Date of Service: Clinton Liner RD O. 04/14/2022 9:30 A M Medical Record Number: [id number] Patient Account Number: [id number] Date of Birth/Sex: Treating RN: 27-Aug-1955 (66 y.o. [name] Primary Care Zayn Selley: [name] Other Clinician: Referring Sneha Willig: Treating Deliliah Spranger/Extender: [name] in Treatment: [address] RAM, HAUGAN ([id number]) [id number] Page 4 of 8 Venous Leg Ulcer Nursing Diagnoses: Potential for venous Insuffiency (use before diagnosis confirmed) Goals: Patient will maintain optimal edema control Date Initiated: 02/24/2022 Target Resolution Date: 05/02/2022 Goal Status: Active Interventions: Assess peripheral edema status every visit. Compression as ordered Notes: Wound/Skin Impairment Nursing Diagnoses: Impaired tissue integrity Goals: Ulcer/skin breakdown will have a volume reduction of 30% by week 4 Date Initiated:  02/24/2022 Date Inactivated: 04/04/2022 Target Resolution Date: 03/31/2022 Goal Status: Unmet Unmet Reason: pt legs swelling Ulcer/skin breakdown will have a volume reduction of 50% by week 8 Date Initiated: 04/04/2022 Target Resolution Date: 05/02/2022 Goal Status: Active Interventions: Assess patient/caregiver ability to perform ulcer/skin care regimen upon admission and as needed Treatment Activities: Topical wound management initiated : 02/24/2022 Notes: Electronic Signature(s) Signed: 04/14/2022 5:11:32 PM By: Dellie Catholic RN Entered By: Dellie Catholic on 04/14/2022 10:15:07 -------------------------------------------------------------------------------- Pain Assessment Details Patient Name: Date of Service: [place], [place]. 04/14/2022 9:30 A M Medical Record Number: [id number] Patient Account Number: [id number] Date of Birth/Sex: Treating RN: 1956-06-13 (66 y.o. [name] Primary Care Roshonda Sperl: [name] Other Clinician: Referring Keevon Henney: Treating Destiney Sanabia/Extender: [name] in Treatment: 7 Active Problems Location of Pain Severity and Description of Pain Patient Has Paino No Site Locations [name], [name] [place] ([id number]) [phone].pdf Page 5 of 8 Pain Management and Medication Current Pain Management: Electronic Signature(s) Signed:  04/14/2022 5:11:32 PM By: Dellie Catholic RN Entered By: Dellie Catholic on 04/14/2022 09:37:16 -------------------------------------------------------------------------------- Patient/Caregiver Education Details Patient Name: Date of Service: Clinton Liner RD Jenetta Downer 10/9/2023andnbsp9:30 A M Medical Record Number: [id number] Patient Account Number: [id number] Date of Birth/Gender: Treating RN: 1955-11-06 (66 y.o. [name] Primary Care Physician: [name] Other Clinician: Referring Physician: Treating Physician/Extender: [name] in Treatment: 7 Education Assessment Education Provided To: Patient Education Topics Provided Wound/Skin Impairment: Methods: Explain/Verbal Responses: Return demonstration correctly Electronic Signature(s) Signed: 04/14/2022 5:11:32 PM By: Dellie Catholic RN Entered By: Dellie Catholic on 04/14/2022 10:15:24 -------------------------------------------------------------------------------- Wound Assessment Details Patient Name: Date of Service: Clinton Liner RD O. 04/14/2022 9:30 A M Medical Record Number: [id number] Patient Account Number: [id number] Date of Birth/Sex: Treating RN: June 12, 1956 (66 y.o. [name] Primary Care Manaal Mandala: [name] Other Clinician: Referring Terrelle Ruffolo: Treating Fatema Rabe/Extender: [name], [name] ([id number]) [phone].pdf Page 6 of 8 Weeks in Treatment: 7 Wound Status Wound Number: 1 Primary Venous Leg Ulcer Etiology: Wound Location: Anterior Lower Leg Wound Open Wounding Event: Blister Status: Date Acquired: 09/25/2021 Comorbid Cataracts, Lymphedema, Sleep Apnea, Congestive Heart Failure, Weeks Of Treatment: 7 History: Coronary Artery Disease, Hypertension, Confinement Anxiety Clustered Wound: Yes Photos Wound Measurements Length: (cm) 8.5 Width: (cm) 2 Depth: (cm) 0.1 Area: (cm) 13.352 Volume: (cm) 1.335 % Reduction in Area: -165.6% % Reduction in Volume: -165.4% Epithelialization: Medium (34-66%) Tunneling: No Undermining: No Wound Description Classification: Full Thickness Without Exposed Suppo Exudate Amount: Large Exudate Type: Serous Exudate Color: amber rt Structures Foul Odor After Cleansing: No Slough/Fibrino Yes Wound Bed Granulation Amount: Large (67-100%) Exposed Structure Granulation Quality: Red Fascia Exposed: No Necrotic Amount: Small (1-33%) Fat Layer (Subcutaneous Tissue) Exposed: No Necrotic Quality: Adherent Slough Tendon  Exposed: No Muscle Exposed: No Joint Exposed: No Bone Exposed: No Periwound Skin Texture Texture Color No Abnormalities Noted: No No Abnormalities Noted: No Hemosiderin Staining: Yes Moisture No Abnormalities Noted: No Temperature / Pain Maceration: Yes Temperature: No Abnormality Assessment Notes 3 clustered wounds Treatment Notes Wound #1 (Lower Leg) Wound Laterality: Anterior Cleanser Soap and Water Discharge Instruction: May shower and wash wound with dial antibacterial soap and water prior to dressing change. Peri-Wound Care Sween Lotion (Moisturizing lotion) Discharge Instruction: Apply moisturizing lotion as directed Topical Triamcinolone Discharge Instruction: Apply Triamcinolone as directed Primary Dressing [name], [name] ([id number]) [id number] Page 7 of 8 KerraCel Ag Gelling Fiber Dressing, 4x5 in (silver alginate) Discharge Instruction: Apply silver alginate to wound bed as instructed Secondary Dressing Zetuvit Plus Silicone Border Dressing 4x4 (in/in) Discharge Instruction: Apply silicone border over primary dressing as directed. Secured With [employer] Surgical T 2x10 (in/yd) ape Discharge Instruction: Secure with tape as directed. Compression Wrap ThreePress (3 layer compression wrap) Discharge Instruction: Apply three layer compression as directed. Compression Stockings Add-Ons Electronic Signature(s) Signed: 04/14/2022 5:11:32 PM By: Dellie Catholic RN Entered By: Dellie Catholic on 04/14/2022 09:44:20 -------------------------------------------------------------------------------- Wound Assessment Details Patient Name: Date of Service: [place], [place]. 04/14/2022 9:30 A M Medical Record Number: [id number] Patient Account Number: [id number] Date of Birth/Sex: Treating RN: [birth year] (66 y.o. [name] Primary Care Vallie Fayette: [name] Other Clinician: Referring Nicholus Chandran: Treating  Abishai Viegas/Extender: [name] in Treatment: 7 Wound Status Wound Number: 2 Primary Lymphedema Etiology: Wound Location: Right, Anterior Lower Leg Wound Healed - Epithelialized Wounding Event: Blister Status: Date Acquired: 04/04/2022 Comorbid Cataracts, Lymphedema, Sleep Apnea, Congestive Heart Failure, Weeks Of Treatment:  1 History: Coronary Artery Disease, Hypertension, Confinement Anxiety Clustered Wound: No Photos Wound Measurements Length: (cm) Width: (cm) Depth: (cm) Area: (cm) Volume: (cm) 0 % Reduction in Area: 100% 0 % Reduction in Volume: 100% 0 Epithelialization: Large (67-100%) 0 Tunneling: No 0 Undermining: No Wound Description Classification: Full Thickness Without Exposed Support Exudate Amount: None Present [name], [name] ([id number]) Wound Bed Granulation Amount: None Present (0%) Necrotic Amount: None Present (0%) Structures Foul Odor After Cleansing: No Slough/Fibrino No [id number] Page 8 of 8 Exposed Structure Fascia Exposed: No Fat Layer (Subcutaneous Tissue) Exposed: No Tendon Exposed: No Muscle Exposed: No Joint Exposed: No Bone Exposed: No Periwound Skin Texture Texture Color No Abnormalities Noted: Yes No Abnormalities Noted: No Hemosiderin Staining: Yes Moisture No Abnormalities Noted: No Electronic Signature(s) Signed: 04/14/2022 5:11:32 PM By: Dellie Catholic RN Entered By: Dellie Catholic on 04/14/2022 09:50:38 -------------------------------------------------------------------------------- [place] Details Patient Name: Date of Service: [place], [place]. 04/14/2022 9:30 A M Medical Record Number: [id number] Patient Account Number: [id number] Date of Birth/Sex: Treating RN: Nov 08, 1955 (66 y.o. [name] Primary Care Lavora Brisbon: [name] Other Clinician: Referring Angeni Chaudhuri: Treating Zonie Crutcher/Extender: [name] in Treatment:  7 Vital Signs Time Taken: 09:29 Temperature (F): 97.9 Height (in): 76 Pulse (bpm): 89 Weight (lbs): 322 Respiratory Rate (breaths/min): 20 Body Mass Index (BMI): 39.2 Blood Pressure (mmHg): 180/100 Reference Range: 80 - 120 mg / dl Electronic Signature(s) Signed: 04/14/2022 5:11:32 PM By: Dellie Catholic RN Entered By: Dellie Catholic on 04/14/2022 09:41:58

## 2022-04-15 ENCOUNTER — Telehealth: Payer: Self-pay

## 2022-04-15 NOTE — Telephone Encounter (Signed)
I called the patient to speak with him about the [employer] Device. The patient voicemail is full

## 2022-04-16 ENCOUNTER — Other Ambulatory Visit: Payer: Self-pay | Admitting: Cardiology

## 2022-04-21 ENCOUNTER — Encounter (HOSPITAL_BASED_OUTPATIENT_CLINIC_OR_DEPARTMENT_OTHER): Payer: 59 | Admitting: General Surgery

## 2022-04-21 DIAGNOSIS — N179 Acute kidney failure, unspecified: Secondary | ICD-10-CM | POA: Diagnosis not present

## 2022-04-21 DIAGNOSIS — R339 Retention of urine, unspecified: Secondary | ICD-10-CM | POA: Diagnosis not present

## 2022-04-21 NOTE — Progress Notes (Signed)
CHIMAOBI, CASEBOLT ([id number]) [id number] Page 1 of 10 Visit Report for 04/21/2022 Chief Complaint Document Details Patient Name: Date of Service: Clinton Liner RD O. 04/21/2022 9:30 A M Medical Record Number: [id number] Patient Account Number: [id number] Date of Birth/Sex: Treating RN: March 07, 1956 (66 y.o. M) Primary Care Provider: [name] Other Clinician: Referring Provider: Treating Provider/Extender: [name] in Treatment: 8 Information Obtained from: Patient Chief Complaint Patient presents for treatment of an open ulcer due to venous insufficiency Electronic Signature(s) Signed: 04/21/2022 7:11:20 AM By: Fredirick Maudlin MD FACS Entered By: Fredirick Maudlin on 04/21/2022 10:11:19 -------------------------------------------------------------------------------- Debridement Details Patient Name: Date of Service: Clinton Liner RD O. 04/21/2022 9:30 A M Medical Record Number: [id number] Patient Account Number: [id number] Date of Birth/Sex: Treating RN: 1956/07/05 (66 y.o. [name] Primary Care Provider: [name] Other Clinician: Referring Provider: Treating Provider/Extender: [name] in Treatment: 8 Debridement Performed for Assessment: Wound #1 Anterior Lower Leg Performed By: Physician Fredirick Maudlin, MD Debridement Type: Debridement Severity of Tissue Pre Debridement: Fat layer exposed Level of Consciousness (Pre-procedure): Awake and Alert Pre-procedure Verification/Time Out Yes - 10:03 Taken: Start Time: 10:03 Pain Control: Lidocaine 5% topical ointment T Area Debrided (L x W): otal 15 (cm) x 12 (cm) = 180 (cm) Tissue and other material debrided: Non-Viable, Slough, Subcutaneous, Slough Level: Skin/Subcutaneous Tissue Debridement Description: Excisional Instrument: Curette Bleeding: Minimum Hemostasis Achieved: Pressure End Time: 10:04 Procedural  Pain: 0 Post Procedural Pain: 0 Response to Treatment: Procedure was tolerated well Level of Consciousness (Post- Awake and Alert procedure): Post Debridement Measurements of Total Wound Length: (cm) 15 Width: (cm) 12 Depth: (cm) 0.1 Volume: (cm) 14.137 Character of Wound/Ulcer Post Debridement: Improved Severity of Tissue Post Debridement: Fat layer exposed [name], [name] ([id number]) [id number] Page 2 of 10 Post Procedure Diagnosis Same as Pre-procedure Notes Scribed for Dr. Celine Ahr by J.Scotton Electronic Signature(s) Signed: 04/21/2022 8:21:54 AM By: Fredirick Maudlin MD FACS Signed: 04/21/2022 6:02:10 PM By: Dellie Catholic RN Entered By: Dellie Catholic on 04/21/2022 10:08:37 -------------------------------------------------------------------------------- HPI Details Patient Name: Date of Service: Clinton Liner RD O. 04/21/2022 9:30 A M Medical Record Number: [id number] Patient Account Number: [id number] Date of Birth/Sex: Treating RN: 16-Feb-1956 (66 y.o. M) Primary Care Provider: [name] Other Clinician: Referring Provider: Treating Provider/Extender: [name] in Treatment: 8 History of Present Illness HPI Description: ADMISSION 02/24/2022 This is a 66 year old man with a past medical history significant for congestive heart failure, morbid obesity, dilated cardiomyopathy, atrial fibrillation, and lower extremity cellulitis. He is currently being treated with Keflex. He is not diabetic. He does not smoke. He says that he developed some blisters on his left anterior tibial surface which subsequently broke open causing the wounds for which she is here to see [place] today. He says that he occasionally wears compression stockings but does not do so on a regular basis. He reports that "they look stupid with shorts." ABI in clinic today was 0.94. On the left anterior tibial surface, there are several small wounds  in a geographic pattern. They do appear consistent with blisters that have ruptured. The fat layer is exposed and there is a thin layer of slough on the surfaces. He does have some surrounding erythema, but no purulent drainage. Edema control is very poor. 03/03/2022: The patient came in for a nurse visit last week because he thought the compression wraps were too tight. They were reapplied and apparently he did not understand the  instruction to try to stay off his feet is much as possible and keep his leg elevated; he instead walked excessively on both Friday and Saturday. As result his leg became more swollen, the wrap became uncomfortable and he cut it off yesterday. He replaced it with a wrap of his own fashion. Today he has an indentation in his leg where his homemade wrap ended. He has extensive 3+ nonpitting edema above this. The wounds themselves are in fairly decent condition with just a little bit of slough on the surface, but edema fluid is frankly pouring out of the largest wound. 03/14/2022: Significant improvement in his wound. He has come in a couple of times for nurse visits due to drainage. Today the wound is quite a bit smaller and just has a layer of eschar and slough on the surface. Edema control is better. 03/23/2022: Ongoing improvement in his wound. His edema control is suboptimal today, however, because his wrap slid down. There is also evidence of excoriation suggesting that perhaps he was scratching under the wrap and it slid. There is a little bit of eschar and slough on the wound surface. 03/28/2022: The patient has been fairly noncompliant with his compression wraps. As result, he has opened a new area of wounding on his anterior tibial surface just proximal to the original site. It is limited breakdown of skin. The original site is smaller and has a little slough buildup. He did see his cardiologist this week and is now taking torsemide twice a day. 04/04/2022: The patient  continues to be fairly noncompliant with his care recommendations. He fails to show up for his venous reflux studies on Monday. He continues to cut his wraps off. Although he is supposed to be taking torsemide twice a day, the degree of edema in his legs suggest that perhaps he is not being compliant with his medication, as well. His blood pressure was elevated today and he said that he did not take his antihypertensives. He claims that the methocarbamol and tizanidine he was prescribed for muscle spasms are responsible for his leg swelling. He has developed a new wound on his right anterior tibial surface. This is fairly small and limited to breakdown of skin. There is a small amount of slough present. The wounds on his left lower leg do not look at all improved. There is slough accumulation on a couple of the deeper spots. He also has an area on his dorsal foot and anterior ankle that look like they are threatening to breakdown; this is where he had cut his wrap back to so that they were rubbing right in this location. 04/14/2022: The right leg has healed and there has been substantial improvement to the wounds on the left. 04/21/2022: The left leg has deteriorated considerably. It is massively edematous and erythematous. The wound is much larger and is pouring fluid. He says that he got it wet in the shower when his cast protector apparently leaked. He says that he had to walk half a mile this morning to his car, as it had been parked some distance away. Electronic Signature(s) Signed: 04/21/2022 7:12:31 AM By: Fredirick Maudlin MD FACS Entered By: Fredirick Maudlin on 04/21/2022 10:12:31 [name] ([id number]) [id number] Page 3 of 10 -------------------------------------------------------------------------------- Physical Exam Details Patient Name: Date of Service: Clinton Liner RD O. 04/21/2022 9:30 A M Medical Record Number: [id number] Patient Account  Number: [id number] Date of Birth/Sex: Treating RN: May 28, 1956 (66 y.o. M) Primary Care Provider: [name] Other  Clinician: Referring Provider: Treating Provider/Extender: [name] in Treatment: 8 Constitutional . . . . No acute distress.Marland Kitchen Respiratory Normal work of breathing on room air.. Cardiovascular 3+ pitting edema on the left. Notes 04/21/2022: The left leg has deteriorated considerably. It is massively edematous and erythematous. The wound is much larger and is pouring fluid. Electronic Signature(s) Signed: 04/21/2022 7:13:28 AM By: Fredirick Maudlin MD FACS Entered By: Fredirick Maudlin on 04/21/2022 10:13:28 -------------------------------------------------------------------------------- Physician Orders Details Patient Name: Date of Service: Clinton Liner RD O. 04/21/2022 9:30 A M Medical Record Number: [id number] Patient Account Number: [id number] Date of Birth/Sex: Treating RN: 1955/12/30 (66 y.o. [name] Primary Care Provider: [name] Other Clinician: Referring Provider: Treating Provider/Extender: [name] in Treatment: 8 Verbal / Phone Orders: No Diagnosis Coding ICD-10 Coding Code Description R60.0 Localized edema L95.32 Chronic systolic (congestive) heart failure I42.0 Dilated cardiomyopathy E66.01 Morbid (severe) obesity due to excess calories I10 Essential (primary) hypertension L97.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 other part of left lower leg limited to breakdown of skin Follow-up Appointments ppointment in 1 week. - Dr. Celine Ahr Rm 3 Monday 04/28/22 at 11am Return A Nurse Visit: - Room 3 Thursday 04/24/22 at 2pm Anesthetic Wound #1 Anterior Lower Leg (In clinic) Topical Lidocaine 5% applied to wound bed - in clinic, prior to debridement (In clinic) Topical Lidocaine 4% applied to wound bed - Used in clinic Bathing/ Shower/ Hygiene May shower with protection but do not get  wound dressing(s) wet. - use cast protector on Left lower leg [name], [name] ([id number]) [id number] Page 4 of 10 Edema Control - Lymphedema / SCD / Other Avoid standing for long periods of time. Patient to wear own compression stockings every day. - wear compression stockings Right leg Wound Treatment Wound #1 - Lower Leg Wound Laterality: Anterior Cleanser: Soap and Water 1 x Per Week/30 Days Discharge Instructions: May shower and wash wound with dial antibacterial soap and water prior to dressing change. Peri-Wound Care: Sween Lotion (Moisturizing lotion) 1 x Per Week/30 Days Discharge Instructions: Apply moisturizing lotion as directed Topical: Mupirocin Ointment 1 x Per Week/30 Days Discharge Instructions: Apply Mupirocin (Bactroban) as instructed Topical: Triamcinolone 1 x Per Week/30 Days Discharge Instructions: Apply Triamcinolone as directed Prim Dressing: KerraCel Ag Gelling Fiber Dressing, 4x5 in (silver alginate) 1 x Per Week/30 Days ary Discharge Instructions: Apply silver alginate to wound bed as instructed Secondary Dressing: Zetuvit Plus Silicone Border Dressing 4x4 (in/in) 1 x Per Week/30 Days Discharge Instructions: Apply silicone border over primary dressing as directed. Secured With: 30M Medipore Public affairs consultant Surgical T 2x10 (in/yd) 1 x Per Week/30 Days ape Discharge Instructions: Secure with tape as directed. Compression Wrap: ThreePress (3 layer compression wrap) 1 x Per Week/30 Days Discharge Instructions: Apply three layer compression as directed. Patient Medications llergies: hydrocodone A Notifications Medication Indication Start End 04/21/2022 Bactrim DS DOSE oral 800 mg-160 mg tablet - 1 tab p.o. twice daily x10 days Electronic Signature(s) Signed: 04/21/2022 8:21:54 AM By: Fredirick Maudlin MD FACS Previous Signature: 04/21/2022 7:14:23 AM Version By: Fredirick Maudlin MD FACS Entered By: Fredirick Maudlin on 04/21/2022  10:14:33 -------------------------------------------------------------------------------- Problem List Details Patient Name: Date of Service: Clinton Liner RD O. 04/21/2022 9:30 A M Medical Record Number: [id number] Patient Account Number: [id number] Date of Birth/Sex: Treating RN: 1956-03-21 (66 y.o. M) Primary Care Provider: [name] Other Clinician: Referring Provider: Treating Provider/Extender: [name] in Treatment: 8 Active Problems ICD-10 Encounter Code Description  Active Date MDM Diagnosis R60.0 Localized edema 02/24/2022 No Yes Y70.62 Chronic systolic (congestive) heart failure 02/24/2022 No Yes [name], [name] ([id number]) [id number] Page 5 of 10 I42.0 Dilated cardiomyopathy 02/24/2022 No Yes E66.01 Morbid (severe) obesity due to excess calories 02/24/2022 No Yes I10 Essential (primary) hypertension 02/24/2022 No Yes L97.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 other part of left lower leg limited to breakdown 03/28/2022 No Yes of skin Inactive Problems ICD-10 Code Description Active Date Inactive Date 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d 02/24/2022 02/24/2022 L97.811 Non-pressure chronic ulcer of other part of right lower leg limited to breakdown of skin 04/04/2022 04/04/2022 Resolved Problems Electronic Signature(s) Signed: 04/21/2022 7:11:05 AM By: Fredirick Maudlin MD FACS Entered By: Fredirick Maudlin on 04/21/2022 10:11:04 -------------------------------------------------------------------------------- Progress Note Details Patient Name: Date of Service: Clinton Liner RD O. 04/21/2022 9:30 A M Medical Record Number: [id number] Patient Account Number: [id number] Date of Birth/Sex: Treating RN: May 26, 1956 (66 y.o. M) Primary Care Provider: [name] Other Clinician: Referring Provider: Treating Provider/Extender: [name] in  Treatment: 8 Subjective Chief Complaint Information obtained from Patient Patient presents for treatment of an open ulcer due to venous insufficiency History of Present Illness (HPI) ADMISSION 02/24/2022 This is a 66 year old man with a past medical history significant for congestive heart failure, morbid obesity, dilated cardiomyopathy, atrial fibrillation, and lower extremity cellulitis. He is currently being treated with Keflex. He is not diabetic. He does not smoke. He says that he developed some blisters on his left anterior tibial surface which subsequently broke open causing the wounds for which she is here to see [place] today. He says that he occasionally wears compression stockings but does not do so on a regular basis. He reports that "they look stupid with shorts." ABI in clinic today was 0.94. On the left anterior tibial surface, there are several small wounds in a geographic pattern. They do appear consistent with blisters that have ruptured. The fat layer is exposed and there is a thin layer of slough on the surfaces. He does have some surrounding erythema, but no purulent drainage. Edema control is very poor. 03/03/2022: The patient came in for a nurse visit last week because he thought the compression wraps were too tight. They were reapplied and apparently he did not understand the instruction to try to stay off his feet is much as possible and keep his leg elevated; he instead walked excessively on both Friday and Saturday. As result his leg became more swollen, the wrap became uncomfortable and he cut it off yesterday. He replaced it with a wrap of his own fashion. Today he has an indentation in his leg where his homemade wrap ended. He has extensive 3+ nonpitting edema above this. The wounds themselves are in fairly decent condition with just a little bit of slough on the surface, but edema fluid is frankly pouring out of the largest wound. [name], [name] ([id number])  [id number] Page 6 of 10 03/14/2022: Significant improvement in his wound. He has come in a couple of times for nurse visits due to drainage. Today the wound is quite a bit smaller and just has a layer of eschar and slough on the surface. Edema control is better. 03/23/2022: Ongoing improvement in his wound. His edema control is suboptimal today, however, because his wrap slid down. There is also evidence of excoriation suggesting that perhaps he was scratching under the wrap and it slid. There is a little bit  of eschar and slough on the wound surface. 03/28/2022: The patient has been fairly noncompliant with his compression wraps. As result, he has opened a new area of wounding on his anterior tibial surface just proximal to the original site. It is limited breakdown of skin. The original site is smaller and has a little slough buildup. He did see his cardiologist this week and is now taking torsemide twice a day. 04/04/2022: The patient continues to be fairly noncompliant with his care recommendations. He fails to show up for his venous reflux studies on Monday. He continues to cut his wraps off. Although he is supposed to be taking torsemide twice a day, the degree of edema in his legs suggest that perhaps he is not being compliant with his medication, as well. His blood pressure was elevated today and he said that he did not take his antihypertensives. He claims that the methocarbamol and tizanidine he was prescribed for muscle spasms are responsible for his leg swelling. He has developed a new wound on his right anterior tibial surface. This is fairly small and limited to breakdown of skin. There is a small amount of slough present. The wounds on his left lower leg do not look at all improved. There is slough accumulation on a couple of the deeper spots. He also has an area on his dorsal foot and anterior ankle that look like they are threatening to breakdown; this is where  he had cut his wrap back to so that they were rubbing right in this location. 04/14/2022: The right leg has healed and there has been substantial improvement to the wounds on the left. 04/21/2022: The left leg has deteriorated considerably. It is massively edematous and erythematous. The wound is much larger and is pouring fluid. He says that he got it wet in the shower when his cast protector apparently leaked. He says that he had to walk half a mile this morning to his car, as it had been parked some distance away. Patient History Information obtained from Patient. Family History Cancer - Father,Mother, Hypertension - Mother,Father, No family history of Diabetes, Heart Disease, Hereditary Spherocytosis, Kidney Disease, Lung Disease, Seizures, Thyroid Problems, Tuberculosis. 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 Medical History Eyes Patient has history of Cataracts - 2021 Ear/Nose/Mouth/Throat Denies history of Chronic sinus problems/congestion, Middle ear problems Hematologic/Lymphatic Patient has history of Lymphedema Respiratory Patient has history of Sleep Apnea Cardiovascular Patient has history of Congestive Heart Failure, Coronary Artery Disease, Hypertension Denies history of Deep Vein Thrombosis Gastrointestinal Denies history of Cirrhosis , Colitis, Crohnoos Endocrine Denies history of Type I Diabetes, Type II Diabetes Genitourinary Denies history of End Stage Renal Disease Immunological Denies history of Raynaudoos, Scleroderma Integumentary (Skin) Denies history of History of Burn Musculoskeletal Denies history of Gout, Rheumatoid Arthritis, Osteoarthritis, Osteomyelitis Neurologic Denies history of Dementia, Neuropathy, Quadriplegia, Paraplegia, Seizure Disorder Oncologic Denies history of Received Chemotherapy, Received Radiation Psychiatric Patient has history of  Confinement Anxiety Denies history of Anorexia/bulimia Hospitalization/Surgery History - Inguinal Hernia repair- 2014. - insertion of mesh-2014. - Atrial ablation surgery- 2007. - BIla eyes lasik- 2008. - hernia repair- 1977. Medical A Surgical History Notes nd Respiratory Allergic Rhinitis Cardiovascular AFIB Objective Constitutional No acute distress.Marland Kitchen [name], [name] ([id number]) [id number] Page 7 of 10 Vitals Time Taken: 9:45 AM, Height: 76 in, Weight: 322 lbs, BMI: 39.2, Temperature: 98.1 F, Pulse: 74 bpm, Respiratory Rate: 20 breaths/min,  Blood Pressure: 102/67 mmHg. Respiratory Normal work of breathing on room air.. Cardiovascular 3+ pitting edema on the left. General Notes: 04/21/2022: The left leg has deteriorated considerably. It is massively edematous and erythematous. The wound is much larger and is pouring fluid. Integumentary (Hair, Skin) Wound #1 status is Open. Original cause of wound was Blister. The date acquired was: 09/25/2021. The wound has been in treatment 8 weeks. The wound is located on the Anterior Lower Leg. The wound measures 15cm length x 12cm width x 0.1cm depth; 141.372cm^2 area and 14.137cm^3 volume. There is Fat Layer (Subcutaneous Tissue) exposed. There is no tunneling or undermining noted. There is a large amount of serous drainage noted. There is large (67-100%) red, hyper - granulation within the wound bed. There is a small (1-33%) amount of necrotic tissue within the wound bed including Adherent Slough. The periwound skin appearance exhibited: Maceration, Hemosiderin Staining, Erythema. The surrounding wound skin color is noted with erythema. Erythema is measured at 15 cm. Periwound temperature was noted as No Abnormality. Assessment Active Problems ICD-10 Localized edema Chronic systolic (congestive) heart failure Dilated cardiomyopathy Morbid (severe) obesity due to excess calories Essential (primary)  hypertension Non-pressure chronic ulcer of other part of left lower leg limited to breakdown of skin Procedures Wound #1 Pre-procedure diagnosis of Wound #1 is a Venous Leg Ulcer located on the Anterior Lower Leg .Severity of Tissue Pre Debridement is: Fat layer exposed. There was a Excisional Skin/Subcutaneous Tissue Debridement with a total area of 180 sq cm performed by Fredirick Maudlin, MD. With the following instrument(s): Curette to remove Non-Viable tissue/material. Material removed includes Subcutaneous Tissue and Slough and after achieving pain control using Lidocaine 5% topical ointment. No specimens were taken. A time out was conducted at 10:03, prior to the start of the procedure. A Minimum amount of bleeding was controlled with Pressure. The procedure was tolerated well with a pain level of 0 throughout and a pain level of 0 following the procedure. Post Debridement Measurements: 15cm length x 12cm width x 0.1cm depth; 14.137cm^3 volume. Character of Wound/Ulcer Post Debridement is improved. Severity of Tissue Post Debridement is: Fat layer exposed. Post procedure Diagnosis Wound #1: Same as Pre-Procedure General Notes: Scribed for Dr. Celine Ahr by J.Scotton. Pre-procedure diagnosis of Wound #1 is a Venous Leg Ulcer located on the Anterior Lower Leg . There was a Three Layer Compression Therapy Procedure by Dellie Catholic, RN. Post procedure Diagnosis Wound #1: Same as Pre-Procedure Plan Follow-up Appointments: Return Appointment in 1 week. - Dr. Celine Ahr Rm 3 Monday 04/28/22 at 11am Nurse Visit: - Room 3 Thursday 04/24/22 at 2pm Anesthetic: Wound #1 Anterior Lower Leg: (In clinic) Topical Lidocaine 5% applied to wound bed - in clinic, prior to debridement (In clinic) Topical Lidocaine 4% applied to wound bed - Used in clinic Bathing/ Shower/ Hygiene: May shower with protection but do not get wound dressing(s) wet. - use cast protector on Left lower leg Edema Control - Lymphedema /  SCD / Other: Avoid standing for long periods of time. Patient to wear own compression stockings every day. - wear compression stockings Right leg The following medication(s) was prescribed: Bactrim DS oral 800 mg-160 mg tablet 1 tab p.o. twice daily x10 days starting 04/21/2022 WOUND #1: - Lower Leg Wound Laterality: Anterior Cleanser: Soap and Water 1 x Per Week/30 Days Discharge Instructions: May shower and wash wound with dial antibacterial soap and water prior to dressing change. Peri-Wound Care: Sween Lotion (Moisturizing lotion) 1 x Per Week/30 Days Discharge Instructions:  Apply moisturizing lotion as directed Topical: Mupirocin Ointment 1 x Per Week/30 Days Discharge Instructions: Apply Mupirocin (Bactroban) as instructed Topical: Triamcinolone 1 x Per Week/30 Days [name], [name] ([id number]) [id number] Page 8 of 10 Discharge Instructions: Apply Triamcinolone as directed Prim Dressing: KerraCel Ag Gelling Fiber Dressing, 4x5 in (silver alginate) 1 x Per Week/30 Days ary Discharge Instructions: Apply silver alginate to wound bed as instructed Secondary Dressing: Zetuvit Plus Silicone Border Dressing 4x4 (in/in) 1 x Per Week/30 Days Discharge Instructions: Apply silicone border over primary dressing as directed. Secured With: 45M Medipore Public affairs consultant Surgical T 2x10 (in/yd) 1 x Per Week/30 Days ape Discharge Instructions: Secure with tape as directed. Com pression Wrap: ThreePress (3 layer compression wrap) 1 x Per Week/30 Days Discharge Instructions: Apply three layer compression as directed. 04/21/2022: The left leg has deteriorated considerably. It is massively edematous and erythematous. The wound is much larger and is pouring fluid. I used a curette to debride slough and nonviable subcutaneous tissue from his wound. He appears to have evidence of cellulitis, as well. I have prescribed oral Bactrim and we will apply topical mupirocin to his wound. He  was encouraged to check his cast protector for any leaks and replace it if found. Follow-up in 1 week. Electronic Signature(s) Signed: 04/21/2022 7:15:14 AM By: Fredirick Maudlin MD FACS Entered By: Fredirick Maudlin on 04/21/2022 10:15:14 -------------------------------------------------------------------------------- HxROS Details Patient Name: Date of Service: Clinton Liner RD O. 04/21/2022 9:30 A M Medical Record Number: [id number] Patient Account Number: [id number] Date of Birth/Sex: Treating RN: [birth year] (66 y.o. M) Primary Care Provider: [name] Other Clinician: Referring Provider: Treating Provider/Extender: [name] in Treatment: 8 Information Obtained From Patient Eyes Medical History: Positive for: Cataracts - 2021 Ear/Nose/Mouth/Throat Medical History: Negative for: Chronic sinus problems/congestion; Middle ear problems Hematologic/Lymphatic Medical History: Positive for: Lymphedema Respiratory Medical History: Positive for: Sleep Apnea Past Medical History Notes: Allergic Rhinitis Cardiovascular Medical History: Positive for: Congestive Heart Failure; Coronary Artery Disease; Hypertension Negative for: Deep Vein Thrombosis Past Medical History Notes: AFIB Gastrointestinal Medical History: Negative for: Cirrhosis ; Colitis; [place], [place] ([id number]) [id number] Page 9 of 10 Endocrine Medical History: Negative for: Type I Diabetes; Type II Diabetes Genitourinary Medical History: Negative for: End Stage Renal Disease Immunological Medical History: Negative for: Raynauds; Scleroderma Integumentary (Skin) Medical History: Negative for: History of Burn Musculoskeletal Medical History: Negative for: Gout; Rheumatoid Arthritis; Osteoarthritis; Osteomyelitis Neurologic Medical History: Negative for: Dementia; Neuropathy; Quadriplegia; Paraplegia; Seizure  Disorder Oncologic Medical History: Negative for: Received Chemotherapy; Received Radiation Psychiatric Medical History: Positive for: Confinement Anxiety Negative for: Anorexia/bulimia HBO Extended History Items Eyes: Cataracts Immunizations Pneumococcal Vaccine: Received Pneumococcal Vaccination: Yes Received Pneumococcal Vaccination On or After 60th Birthday: Yes Implantable Devices None Hospitalization / Surgery History Type of Hospitalization/Surgery Inguinal Hernia repair- 2014 insertion of mesh-2014 Atrial ablation surgery- 2007 BIla eyes lasik- 2008 hernia repair- 1977 Family and Social History Cancer: Yes - Father,Mother; Diabetes: No; Heart Disease: No; Hereditary Spherocytosis: No; Hypertension: Yes - Mother,Father; Kidney Disease: No; Lung Disease: No; Seizures: No; Thyroid Problems: No; Tuberculosis: No; Never smoker; Marital Status - Married; Alcohol Use: Never; Drug Use: No History; Caffeine Use: 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 04/21/2022 Medical Record Number: [id number] Patient Account Number: [id number] Date of Birth/Sex: Treating RN: 1956-05-20 (66 y.o. M) Primary Care Provider: [name] Other Clinician: Referring Provider: Treating Provider/Extender: [name] in Treatment: 8 Diagnosis Coding ICD-10 Codes Code Description R60.0 Localized edema G26.94 Chronic systolic (congestive) heart failure I42.0 Dilated cardiomyopathy E66.01 Morbid (severe) obesity due to excess  calories I10 Essential (primary) hypertension L97.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 other part of left lower leg limited to breakdown of skin Facility Procedures : CPT4 Code: [id number] Description: 50093 - DEB SUBQ TISSUE 20 SQ CM/< ICD-10 Diagnosis Description L97.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 other part of left lower leg limited to breakdown Modifier: of skin Quantity: 1 : CPT4 Code: 81829937 Description: 16967 - DEB SUBQ TISS EA ADDL 20CM ICD-10 Diagnosis Description L97.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 other part of left lower leg limited to breakdown Modifier: of skin Quantity: 8 Physician Procedures : CPT4 Code Description Modifier [id number] 75102 - WC PHYS LEVEL 4 - EST PT 25 ICD-10 Diagnosis Description L97.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 other part of left lower leg limited to breakdown of skin R60.0 Localized edema H85.27 Chronic systolic  (congestive) heart failure E66.01 Morbid (severe) obesity due to excess calories Quantity: 1 : [id number] 11042 - WC PHYS SUBQ TISS 20 SQ CM ICD-10 Diagnosis Description L97.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 other part of left lower leg limited to breakdown of skin Quantity: 1 : 3614431 11045 - WC PHYS SUBQ TISS EA ADDL 20 CM ICD-10 Diagnosis Description L97.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 other part of left lower leg limited to breakdown of skin Quantity: 8 Electronic Signature(s) Signed: 04/21/2022 7:34:31 AM By: Fredirick Maudlin MD FACS Entered By: Fredirick Maudlin on 04/21/2022 10:34:30

## 2022-04-21 NOTE — Progress Notes (Signed)
Clinton, [name] ([id number]) [id number] Page 1 of 7 Visit Report for 04/21/2022 Arrival Information Details Patient Name: Date of Service: Clinton Liner RD O. 04/21/2022 9:30 A M Medical Record Number: [id number] Patient Account Number: [id number] Date of Birth/Sex: Treating RN: October 20, 1955 (66 y.o. [name] Primary Care Arseniy Toomey: [name] Other Clinician: Referring Aedan Geimer: Treating Jackey Housey/Extender: [name] in Treatment: 8 Visit Information History Since Last Visit Added or deleted any medications: No Patient Arrived: Ambulatory Any new allergies or adverse reactions: No Arrival Time: 09:43 Had a fall or experienced change in No Accompanied By: self activities of daily living that may affect Transfer Assistance: None risk of falls: Patient Identification Verified: Yes Hospitalized since last visit: No Patient Requires Transmission-Based Precautions: No Implantable device outside of the clinic excluding No Patient Has Alerts: No cellular tissue based products placed in the center since last visit: Has Dressing in Place as Prescribed: No Has Compression in Place as Prescribed: No Pain Present Now: No Electronic Signature(s) Signed: 04/21/2022 6:02:10 PM By: Dellie Catholic RN Entered By: Dellie Catholic on 04/21/2022 09:49:13 -------------------------------------------------------------------------------- Compression Therapy Details Patient Name: Date of Service: [place], [place]. 04/21/2022 9:30 A M Medical Record Number: [id number] Patient Account Number: [id number] Date of Birth/Sex: Treating RN: 1955/09/17 (66 y.o. [name] Primary Care Nyeli Holtmeyer: [name] Other Clinician: Referring Thetis Schwimmer: Treating Xavien Dauphinais/Extender: [name] in Treatment: 8 Compression Therapy Performed for Wound Assessment: Wound #1 Anterior Lower Leg Performed By:  Clinician Dellie Catholic, RN Compression Type: Three Layer Post Procedure Diagnosis Same as Pre-procedure Electronic Signature(s) Signed: 04/21/2022 6:02:10 PM By: Dellie Catholic RN Entered By: Dellie Catholic on 04/21/2022 10:08:21 [name] ([id number]) [id number] Page 2 of 7 -------------------------------------------------------------------------------- Encounter Discharge Information Details Patient Name: Date of Service: Clinton Liner RD O. 04/21/2022 9:30 A M Medical Record Number: [id number] Patient Account Number: [id number] Date of Birth/Sex: Treating RN: 1955/10/22 (66 y.o. [name] Primary Care Ladeja Pelham: [name] Other Clinician: Referring Kobi Aller: Treating Newton Frutiger/Extender: [name] in Treatment: 8 Encounter Discharge Information Items Post Procedure Vitals Discharge Condition: Stable Temperature (F): 98.1 Ambulatory Status: Ambulatory Pulse (bpm): 74 Discharge Destination: Home Respiratory Rate (breaths/min): 20 Transportation: Private Auto Blood Pressure (mmHg): 102/67 Accompanied By: self Schedule Follow-up Appointment: Yes Clinical Summary of Care: Patient Declined Electronic Signature(s) Signed: 04/21/2022 6:02:10 PM By: Dellie Catholic RN Entered By: Dellie Catholic on 04/21/2022 17:15:10 -------------------------------------------------------------------------------- Lower Extremity Assessment Details Patient Name: Date of Service: Clinton Liner RD O. 04/21/2022 9:30 A M Medical Record Number: [id number] Patient Account Number: [id number] Date of Birth/Sex: Treating RN: [birth year] (66 y.o. [name] Primary Care Tamu Golz: [name] Other Clinician: Referring Kimarie Coor: Treating Tiffany Calmes/Extender: [name] in Treatment: 8 Edema Assessment Assessed: [Left: No] [Right: No] [Left: Edema] [Right: :] Calf Left: Right: Point of  Measurement: 38 cm From Medial Instep 52 cm Ankle Left: Right: Point of Measurement: 11 cm From Medial Instep 34.5 cm Vascular Assessment Pulses: Dorsalis Pedis Palpable: [Left:Yes] [Right:Yes] Electronic Signature(s) Signed: 04/21/2022 6:02:10 PM By: Dellie Catholic RN Entered By: Dellie Catholic on 04/21/2022 09:49:53 Multi Wound Chart Details -------------------------------------------------------------------------------- [name] ([id number]) [id number] Page 3 of 7 Patient Name: Date of Service: Clinton Liner RD O. 04/21/2022 9:30 A M Medical Record Number: [id number] Patient Account Number: [id number] Date of Birth/Sex: Treating RN: Dec 07, 1955 (66 y.o. M) Primary Care Danaja Lasota: [name] Other Clinician: Referring Jorryn Hershberger: Treating Arnetta Odeh/Extender:  Janeal Holmes Weeks in Treatment: 8 Vital Signs Height(in): 76 Pulse(bpm): 74 Weight(lbs): 188 Blood Pressure(mmHg): 102/67 Body Mass Index(BMI): 39.2 Temperature(F): 98.1 Respiratory Rate(breaths/min): 20 Wound Assessments Wound Number: 1 N/A N/A Photos: N/A N/A Anterior Lower Leg N/A N/A Wound Location: Blister N/A N/A Wounding Event: Venous Leg Ulcer N/A N/A Primary Etiology: Cataracts, Lymphedema, Sleep N/A N/A Comorbid History: Apnea, Congestive Heart Failure, Coronary Artery Disease, Hypertension, Confinement Anxiety 09/25/2021 N/A N/A Date Acquired: 8 N/A N/A Weeks of Treatment: Open N/A N/A Wound Status: No N/A N/A Wound Recurrence: Yes N/A N/A Clustered Wound: 15x12x0.1 N/A N/A Measurements L x W x D (cm) 141.372 N/A N/A A (cm) : rea 14.137 N/A N/A Volume (cm) : -2712.30% N/A N/A % Reduction in A rea: -2710.50% N/A N/A % Reduction in Volume: Full Thickness Without Exposed N/A N/A Classification: Support Structures Large N/A N/A Exudate A mount: Serous N/A N/A Exudate Type: amber N/A N/A Exudate Color: Large (67-100%)  N/A N/A Granulation A mount: Red, Hyper-granulation N/A N/A Granulation Quality: Small (1-33%) N/A N/A Necrotic A mount: Fat Layer (Subcutaneous Tissue): Yes N/A N/A Exposed Structures: Fascia: No Tendon: No Muscle: No Joint: No Bone: No Medium (34-66%) N/A N/A Epithelialization: Debridement - Excisional N/A N/A Debridement: Pre-procedure Verification/Time Out 10:03 N/A N/A Taken: Lidocaine 5% topical ointment N/A N/A Pain Control: Subcutaneous, Slough N/A N/A Tissue Debrided: Skin/Subcutaneous Tissue N/A N/A Level: 180 N/A N/A Debridement A (sq cm): rea Curette N/A N/A Instrument: Minimum N/A N/A Bleeding: Pressure N/A N/A Hemostasis A chieved: 0 N/A N/A Procedural Pain: 0 N/A N/A Post Procedural Pain: Procedure was tolerated well N/A N/A Debridement Treatment Response: 15x12x0.1 N/A N/A Post Debridement Measurements L x W x D (cm) 14.137 N/A N/A Post Debridement Volume: (cm) Maceration: Yes N/A N/A Periwound Skin Moisture: Erythema: Yes N/A N/A Periwound Skin Color: Hemosiderin Staining: Yes Measured: 15cm N/A N/A Erythema Measurement: No Abnormality N/A N/A Temperature: Compression Therapy N/A N/A Procedures Performed: Debridement [name], [name] ([id number]) [id number] Page 4 of 7 Treatment Notes Electronic Signature(s) Signed: 04/21/2022 7:11:10 AM By: Fredirick Maudlin MD FACS Entered By: Fredirick Maudlin on 04/21/2022 10:11:10 -------------------------------------------------------------------------------- Multi-Disciplinary Care Plan Details Patient Name: Date of Service: Clinton Liner RD O. 04/21/2022 9:30 A M Medical Record Number: [id number] Patient Account Number: [id number] Date of Birth/Sex: Treating RN: 02-Apr-1956 (66 y.o. [name] Primary Care Aayushi Solorzano: [name] Other Clinician: Referring Serenity Batley: Treating Chabely Norby/Extender: [name] in Treatment:  8 Active Inactive Venous Leg Ulcer Nursing Diagnoses: Potential for venous Insuffiency (use before diagnosis confirmed) Goals: Patient will maintain optimal edema control Date Initiated: 02/24/2022 Target Resolution Date: 05/02/2022 Goal Status: Active Interventions: Assess peripheral edema status every visit. Compression as ordered Notes: Wound/Skin Impairment Nursing Diagnoses: Impaired tissue integrity Goals: Ulcer/skin breakdown will have a volume reduction of 30% by week 4 Date Initiated: 02/24/2022 Date Inactivated: 04/04/2022 Target Resolution Date: 03/31/2022 Goal Status: Unmet Unmet Reason: pt legs swelling Ulcer/skin breakdown will have a volume reduction of 50% by week 8 Date Initiated: 04/04/2022 Target Resolution Date: 05/02/2022 Goal Status: Active Interventions: Assess patient/caregiver ability to perform ulcer/skin care regimen upon admission and as needed Treatment Activities: Topical wound management initiated : 02/24/2022 Notes: Electronic Signature(s) Signed: 04/21/2022 6:02:10 PM By: Dellie Catholic RN Entered By: Dellie Catholic on 04/21/2022 17:13:45 [name] ([id number]) [id number] Page 5 of 7 -------------------------------------------------------------------------------- Pain Assessment Details Patient Name: Date of Service: Clinton Liner RD O. 04/21/2022 9:30 A M Medical Record Number: [id number] Patient  Account Number: [id number] Date of Birth/Sex: Treating RN: May 20, 1956 (66 y.o. [name] Primary Care Ervie Mccard: [name] Other Clinician: Referring Rontrell Moquin: Treating Noelene Gang/Extender: [name] in Treatment: 8 Active Problems Location of Pain Severity and Description of Pain Patient Has Paino No Site Locations Pain Management and Medication Current Pain Management: Electronic Signature(s) Signed: 04/21/2022 6:02:10 PM By: Dellie Catholic RN Entered By:  Dellie Catholic on 04/21/2022 09:49:44 -------------------------------------------------------------------------------- Patient/Caregiver Education Details Patient Name: Date of Service: Clinton Liner RD Jenetta Downer 10/16/2023andnbsp9:30 [place] Record Number: [id number] Patient Account Number: [id number] Date of Birth/Gender: Treating RN: 1955/08/25 (66 y.o. [name] Primary Care Physician: [name] Other Clinician: Referring Physician: Treating Physician/Extender: [name] in Treatment: 8 Education Assessment Education Provided To: Patient Education Topics Provided Wound/Skin Impairment: Methods: Explain/Verbal Responses: Return demonstration correctly Electronic Signature(s) Signed: 04/21/2022 6:02:10 PM By: Dellie Catholic RN Entered By: Dellie Catholic on 04/21/2022 17:14:17 [name] ([id number]) [id number] Page 6 of 7 -------------------------------------------------------------------------------- Wound Assessment Details Patient Name: Date of Service: Clinton Liner RD O. 04/21/2022 9:30 A M Medical Record Number: [id number] Patient Account Number: [id number] Date of Birth/Sex: Treating RN: March 16, 1956 (66 y.o. [name] Primary Care Ceasia Elwell: [name] Other Clinician: Referring Finnick Orosz: Treating Macee Venables/Extender: [name] in Treatment: 8 Wound Status Wound Number: 1 Primary Venous Leg Ulcer Etiology: Wound Location: Anterior Lower Leg Wound Open Wounding Event: Blister Status: Date Acquired: 09/25/2021 Comorbid Cataracts, Lymphedema, Sleep Apnea, Congestive Heart Failure, Weeks Of Treatment: 8 History: Coronary Artery Disease, Hypertension, Confinement Anxiety Clustered Wound: Yes Photos Wound Measurements Length: (cm) 15 Width: (cm) 12 Depth: (cm) 0.1 Area: (cm) 141.372 Volume: (cm) 14.137 % Reduction in Area: -2712.3% % Reduction in  Volume: -2710.5% Epithelialization: Medium (34-66%) Tunneling: No Undermining: No Wound Description Classification: Full Thickness Without Exposed Support Exudate Amount: Large Exudate Type: Serous Exudate Color: amber Structures Foul Odor After Cleansing: No Slough/Fibrino Yes Wound Bed Granulation Amount: Large (67-100%) Exposed Structure Granulation Quality: Red, Hyper-granulation Fascia Exposed: No Necrotic Amount: Small (1-33%) Fat Layer (Subcutaneous Tissue) Exposed: Yes Necrotic Quality: Adherent Slough Tendon Exposed: No Muscle Exposed: No Joint Exposed: No Bone Exposed: No Periwound Skin Texture Texture Color No Abnormalities Noted: No No Abnormalities Noted: No Erythema: Yes Moisture Erythema Measurement: Measured No Abnormalities Noted: No 15 cm Maceration: Yes Hemosiderin Staining: Yes Temperature / Pain Temperature: No Abnormality Treatment Notes [name], [name] ([id number]) [id number] Page 7 of 7 Wound #1 (Lower Leg) Wound Laterality: Anterior Cleanser Soap and Water Discharge Instruction: May shower and wash wound with dial antibacterial soap and water prior to dressing change. Peri-Wound Care Sween Lotion (Moisturizing lotion) Discharge Instruction: Apply moisturizing lotion as directed Topical Mupirocin Ointment Discharge Instruction: Apply Mupirocin (Bactroban) as instructed Triamcinolone Discharge Instruction: Apply Triamcinolone as directed Primary Dressing KerraCel Ag Gelling Fiber Dressing, 4x5 in (silver alginate) Discharge Instruction: Apply silver alginate to wound bed as instructed Secondary Dressing Zetuvit Plus Silicone Border Dressing 4x4 (in/in) Discharge Instruction: Apply silicone border over primary dressing as directed. Secured With [employer] Surgical T 2x10 (in/yd) ape Discharge Instruction: Secure with tape as directed. Compression Wrap ThreePress (3 layer compression  wrap) Discharge Instruction: Apply three layer compression as directed. Compression Stockings Add-Ons Electronic Signature(s) Signed: 04/21/2022 6:02:10 PM By: Dellie Catholic RN Entered By: Dellie Catholic on 04/21/2022 09:57:30 -------------------------------------------------------------------------------- Vitals Details Patient Name: Date of Service: Clinton Liner RD O. 04/21/2022 9:30 A M Medical Record Number: [id number] Patient  Account Number: [id number] Date of Birth/Sex: Treating RN: April 02, 1956 (66 y.o. [name] Primary Care Solomia Harrell: [name] Other Clinician: Referring Deneshia Zucker: Treating Seara Hinesley/Extender: [name] in Treatment: 8 Vital Signs Time Taken: 09:45 Temperature (F): 98.1 Height (in): 76 Pulse (bpm): 74 Weight (lbs): 322 Respiratory Rate (breaths/min): 20 Body Mass Index (BMI): 39.2 Blood Pressure (mmHg): 102/67 Reference Range: 80 - 120 mg / dl Electronic Signature(s) Signed: 04/21/2022 6:02:10 PM By: Dellie Catholic RN Entered By: Dellie Catholic on 04/21/2022 09:49:38

## 2022-04-24 ENCOUNTER — Encounter (HOSPITAL_COMMUNITY): Payer: Self-pay | Admitting: Internal Medicine

## 2022-04-24 ENCOUNTER — Encounter (HOSPITAL_BASED_OUTPATIENT_CLINIC_OR_DEPARTMENT_OTHER): Payer: 59 | Admitting: Internal Medicine

## 2022-04-24 ENCOUNTER — Other Ambulatory Visit: Payer: Self-pay

## 2022-04-24 ENCOUNTER — Emergency Department (HOSPITAL_COMMUNITY): Payer: 59

## 2022-04-24 ENCOUNTER — Inpatient Hospital Stay (HOSPITAL_COMMUNITY)
Admission: EM | Admit: 2022-04-24 | Discharge: 2022-04-28 | DRG: 683 | Disposition: A | Discharge status: Home / Self Care | Payer: 59 | Attending: Internal Medicine | Admitting: Internal Medicine

## 2022-04-24 DIAGNOSIS — E8770 Fluid overload, unspecified: Secondary | ICD-10-CM | POA: Diagnosis not present

## 2022-04-24 DIAGNOSIS — R338 Other retention of urine: Secondary | ICD-10-CM | POA: Diagnosis present

## 2022-04-24 DIAGNOSIS — I4821 Permanent atrial fibrillation: Secondary | ICD-10-CM | POA: Diagnosis present

## 2022-04-24 DIAGNOSIS — I428 Other cardiomyopathies: Secondary | ICD-10-CM | POA: Diagnosis present

## 2022-04-24 DIAGNOSIS — Z885 Allergy status to narcotic agent status: Secondary | ICD-10-CM | POA: Diagnosis not present

## 2022-04-24 DIAGNOSIS — Z888 Allergy status to other drugs, medicaments and biological substances status: Secondary | ICD-10-CM

## 2022-04-24 DIAGNOSIS — G629 Polyneuropathy, unspecified: Secondary | ICD-10-CM | POA: Diagnosis present

## 2022-04-24 DIAGNOSIS — I5032 Chronic diastolic (congestive) heart failure: Secondary | ICD-10-CM | POA: Diagnosis present

## 2022-04-24 DIAGNOSIS — I081 Rheumatic disorders of both mitral and tricuspid valves: Secondary | ICD-10-CM | POA: Diagnosis present

## 2022-04-24 DIAGNOSIS — N400 Enlarged prostate without lower urinary tract symptoms: Secondary | ICD-10-CM | POA: Diagnosis present

## 2022-04-24 DIAGNOSIS — F419 Anxiety disorder, unspecified: Secondary | ICD-10-CM | POA: Diagnosis present

## 2022-04-24 DIAGNOSIS — R339 Retention of urine, unspecified: Principal | ICD-10-CM

## 2022-04-24 DIAGNOSIS — Z6841 Body Mass Index (BMI) 40.0 and over, adult: Secondary | ICD-10-CM | POA: Diagnosis not present

## 2022-04-24 DIAGNOSIS — G4733 Obstructive sleep apnea (adult) (pediatric): Secondary | ICD-10-CM

## 2022-04-24 DIAGNOSIS — Z8249 Family history of ischemic heart disease and other diseases of the circulatory system: Secondary | ICD-10-CM

## 2022-04-24 DIAGNOSIS — E785 Hyperlipidemia, unspecified: Secondary | ICD-10-CM | POA: Diagnosis present

## 2022-04-24 DIAGNOSIS — G2581 Restless legs syndrome: Secondary | ICD-10-CM | POA: Diagnosis present

## 2022-04-24 DIAGNOSIS — N1330 Unspecified hydronephrosis: Secondary | ICD-10-CM | POA: Diagnosis present

## 2022-04-24 DIAGNOSIS — Z79899 Other long term (current) drug therapy: Secondary | ICD-10-CM | POA: Diagnosis not present

## 2022-04-24 DIAGNOSIS — K219 Gastro-esophageal reflux disease without esophagitis: Secondary | ICD-10-CM | POA: Diagnosis present

## 2022-04-24 DIAGNOSIS — N1832 Chronic kidney disease, stage 3b: Secondary | ICD-10-CM | POA: Diagnosis present

## 2022-04-24 DIAGNOSIS — Z7901 Long term (current) use of anticoagulants: Secondary | ICD-10-CM

## 2022-04-24 DIAGNOSIS — N179 Acute kidney failure, unspecified: Principal | ICD-10-CM | POA: Diagnosis present

## 2022-04-24 DIAGNOSIS — I130 Hypertensive heart and chronic kidney disease with heart failure and stage 1 through stage 4 chronic kidney disease, or unspecified chronic kidney disease: Secondary | ICD-10-CM | POA: Diagnosis present

## 2022-04-24 DIAGNOSIS — F32A Depression, unspecified: Secondary | ICD-10-CM | POA: Diagnosis present

## 2022-04-24 DIAGNOSIS — I5031 Acute diastolic (congestive) heart failure: Secondary | ICD-10-CM | POA: Diagnosis not present

## 2022-04-24 DIAGNOSIS — J45909 Unspecified asthma, uncomplicated: Secondary | ICD-10-CM | POA: Diagnosis present

## 2022-04-24 DIAGNOSIS — D631 Anemia in chronic kidney disease: Secondary | ICD-10-CM | POA: Diagnosis present

## 2022-04-24 DIAGNOSIS — L03116 Cellulitis of left lower limb: Secondary | ICD-10-CM | POA: Diagnosis present

## 2022-04-24 DIAGNOSIS — Z825 Family history of asthma and other chronic lower respiratory diseases: Secondary | ICD-10-CM

## 2022-04-24 LAB — I-STAT CHEM 8, ED
BUN: 115 mg/dL — ABNORMAL HIGH (ref 8–23)
Calcium, Ion: 1.11 mmol/L — ABNORMAL LOW (ref 1.15–1.40)
Chloride: 107 mmol/L (ref 98–111)
Creatinine, Ser: 5.5 mg/dL — ABNORMAL HIGH (ref 0.61–1.24)
Glucose, Bld: 96 mg/dL (ref 70–99)
HCT: 28 % — ABNORMAL LOW (ref 39.0–52.0)
Hemoglobin: 9.5 g/dL — ABNORMAL LOW (ref 13.0–17.0)
Potassium: 4.1 mmol/L (ref 3.5–5.1)
Sodium: 139 mmol/L (ref 135–145)
TCO2: 20 mmol/L — ABNORMAL LOW (ref 22–32)

## 2022-04-24 LAB — IRON AND TIBC
Iron: 25 ug/dL — ABNORMAL LOW (ref 45–182)
Saturation Ratios: 10 % — ABNORMAL LOW (ref 17.9–39.5)
TIBC: 252 ug/dL (ref 250–450)
UIBC: 227 ug/dL

## 2022-04-24 LAB — URINALYSIS, ROUTINE W REFLEX MICROSCOPIC
Bacteria, UA: NONE SEEN
Bilirubin Urine: NEGATIVE
Glucose, UA: NEGATIVE mg/dL
Ketones, ur: NEGATIVE mg/dL
Nitrite: NEGATIVE
Protein, ur: NEGATIVE mg/dL
RBC / HPF: >50 RBC/hpf — ABNORMAL HIGH (ref 0–5)
Specific Gravity, Urine: 1.011 (ref 1.005–1.030)
pH: 5 (ref 5.0–8.0)

## 2022-04-24 LAB — TROPONIN I (HIGH SENSITIVITY)
Troponin I (High Sensitivity): 13 ng/L (ref <18)
Troponin I (High Sensitivity): 13 ng/L (ref <18)

## 2022-04-24 LAB — COMPREHENSIVE METABOLIC PANEL
ALT: 32 U/L (ref 0–44)
AST: 27 U/L (ref 15–41)
Albumin: 2.7 g/dL — ABNORMAL LOW (ref 3.5–5.0)
Alkaline Phosphatase: 79 U/L (ref 38–126)
Anion gap: 10 (ref 5–15)
BUN: 97 mg/dL — ABNORMAL HIGH (ref 8–23)
CO2: 18 mmol/L — ABNORMAL LOW (ref 22–32)
Calcium: 8 mg/dL — ABNORMAL LOW (ref 8.9–10.3)
Chloride: 109 mmol/L (ref 98–111)
Creatinine, Ser: 4.98 mg/dL — ABNORMAL HIGH (ref 0.61–1.24)
GFR, Estimated: 12 mL/min — ABNORMAL LOW (ref >60)
Glucose, Bld: 99 mg/dL (ref 70–99)
Potassium: 3.9 mmol/L (ref 3.5–5.1)
Sodium: 137 mmol/L (ref 135–145)
Total Bilirubin: 0.6 mg/dL (ref 0.3–1.2)
Total Protein: 6.7 g/dL (ref 6.5–8.1)

## 2022-04-24 LAB — LACTIC ACID, PLASMA: Lactic Acid, Venous: 1 mmol/L (ref 0.5–1.9)

## 2022-04-24 LAB — BLOOD GAS, VENOUS
Acid-base deficit: 6 mmol/L — ABNORMAL HIGH (ref 0.0–2.0)
Bicarbonate: 19.6 mmol/L — ABNORMAL LOW (ref 20.0–28.0)
O2 Saturation: 73.4 %
Patient temperature: 37
pCO2, Ven: 38 mmHg — ABNORMAL LOW (ref 44–60)
pH, Ven: 7.32 (ref 7.25–7.43)
pO2, Ven: 46 mmHg — ABNORMAL HIGH (ref 32–45)

## 2022-04-24 LAB — CBC WITH DIFFERENTIAL/PLATELET
Abs Immature Granulocytes: 0.17 10*3/uL — ABNORMAL HIGH (ref 0.00–0.07)
Basophils Absolute: 0 10*3/uL (ref 0.0–0.1)
Basophils Relative: 0 %
Eosinophils Absolute: 0.3 10*3/uL (ref 0.0–0.5)
Eosinophils Relative: 2 %
HCT: 29.2 % — ABNORMAL LOW (ref 39.0–52.0)
Hemoglobin: 9.3 g/dL — ABNORMAL LOW (ref 13.0–17.0)
Immature Granulocytes: 2 %
Lymphocytes Relative: 6 %
Lymphs Abs: 0.6 10*3/uL — ABNORMAL LOW (ref 0.7–4.0)
MCH: 28.9 pg (ref 26.0–34.0)
MCHC: 31.8 g/dL (ref 30.0–36.0)
MCV: 90.7 fL (ref 80.0–100.0)
Monocytes Absolute: 1 10*3/uL (ref 0.1–1.0)
Monocytes Relative: 8 %
Neutro Abs: 9.6 10*3/uL — ABNORMAL HIGH (ref 1.7–7.7)
Neutrophils Relative %: 82 %
Platelets: 235 10*3/uL (ref 150–400)
RBC: 3.22 MIL/uL — ABNORMAL LOW (ref 4.22–5.81)
RDW: 15 % (ref 11.5–15.5)
WBC: 11.6 10*3/uL — ABNORMAL HIGH (ref 4.0–10.5)
nRBC: 0 % (ref 0.0–0.2)

## 2022-04-24 LAB — BRAIN NATRIURETIC PEPTIDE: B Natriuretic Peptide: 626.9 pg/mL — ABNORMAL HIGH (ref 0.0–100.0)

## 2022-04-24 LAB — FERRITIN: Ferritin: 152 ng/mL (ref 24–336)

## 2022-04-24 LAB — HIV ANTIBODY (ROUTINE TESTING W REFLEX): HIV Screen 4th Generation wRfx: NONREACTIVE

## 2022-04-24 IMAGING — DX DG CHEST 2V
2 series · 2 of 2 positions shown · non-contrast
Comparison: Chest x-ray 11/17/2018, CT chest 11/17/2018, chest
x-ray 07/15/2004

CLINICAL DATA: Shortness of breath

EXAM:
CHEST - 2 VIEW

[chest pa]
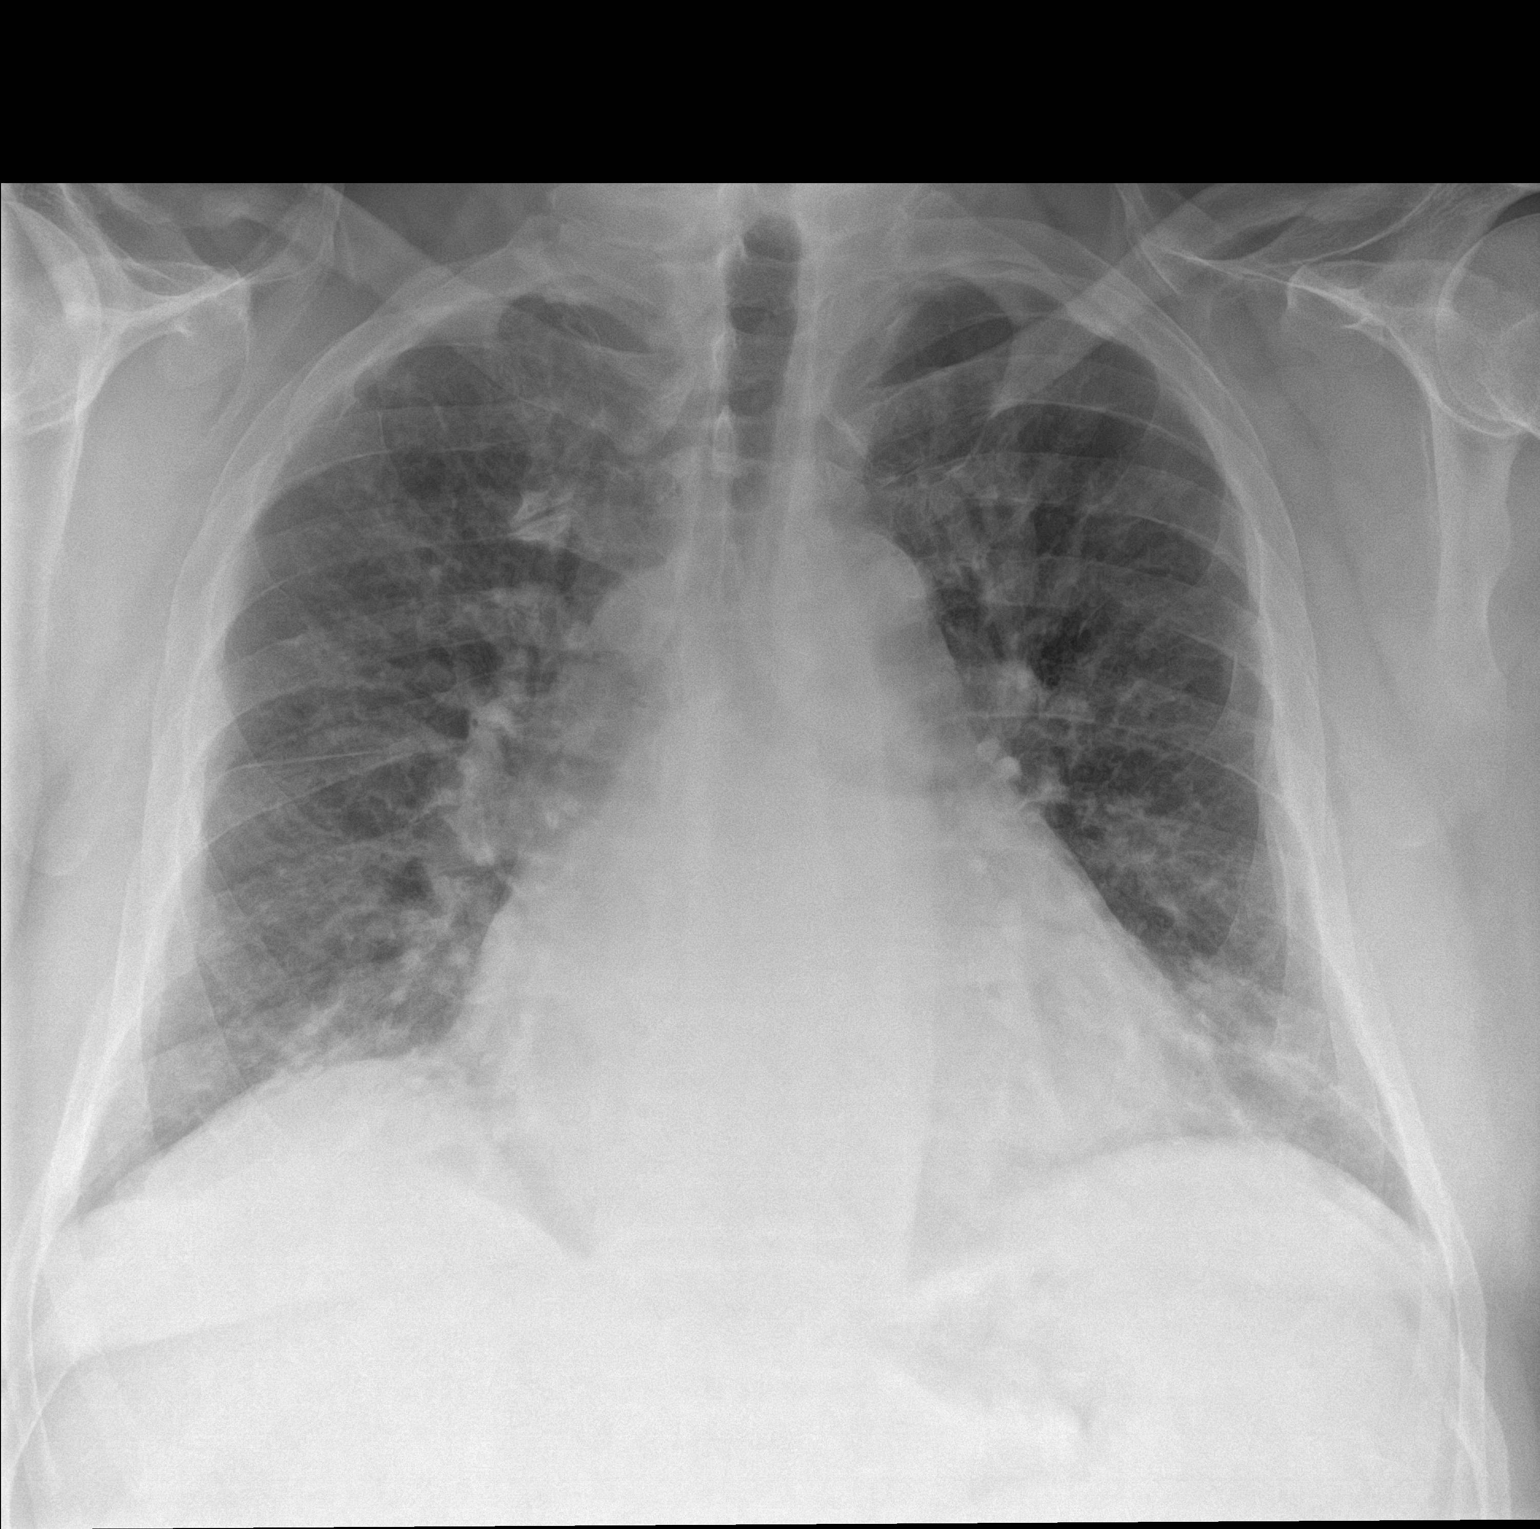

[chest lat]
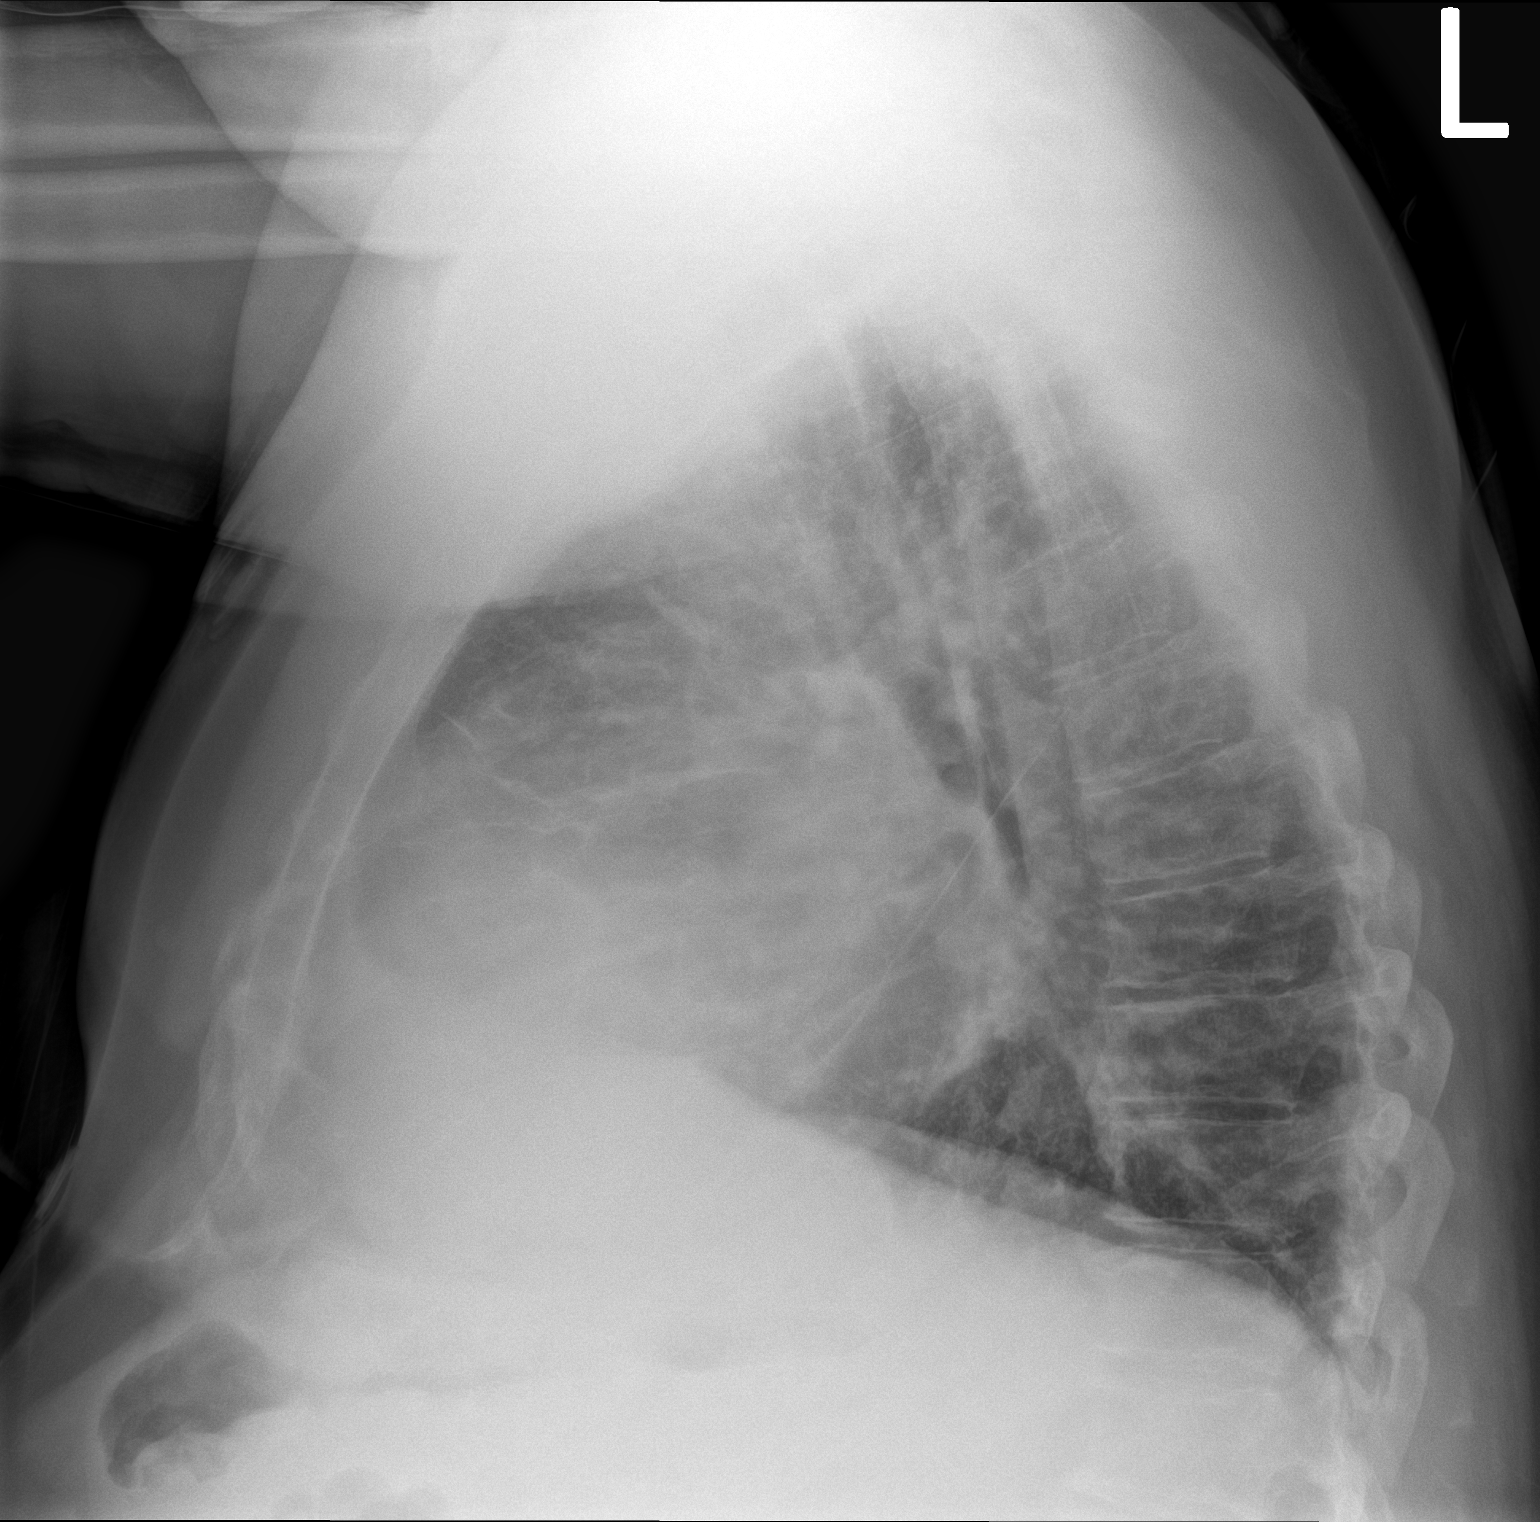

[2 of 2 positions shown; findings below may reference images not displayed]

FINDINGS: The heart and mediastinal contours are unchanged.

Biapical pleural/pulmonary scarring. No focal consolidation. No
pulmonary edema. No pleural effusion. No pneumothorax.

No acute osseous abnormality.
IMPRESSION: No active cardiopulmonary disease.

## 2022-04-24 MED ORDER — PANTOPRAZOLE SODIUM 40 MG PO TBEC
40.0000 mg | DELAYED_RELEASE_TABLET | Freq: Every day | ORAL | Status: DC
  Administered 2022-04-24 – 2022-04-28 (×5): 40 mg via ORAL
  Filled 2022-04-24 (×5): qty 1

## 2022-04-24 MED ORDER — CEFAZOLIN SODIUM-DEXTROSE 2-4 GM/100ML-% IV SOLN
2.0000 g | Freq: Once | INTRAVENOUS | Status: AC
  Administered 2022-04-24: 2 g via INTRAVENOUS
  Filled 2022-04-24: qty 100

## 2022-04-24 MED ORDER — MELATONIN 5 MG PO TABS
5.0000 mg | ORAL_TABLET | Freq: Every evening | ORAL | Status: DC | PRN
  Administered 2022-04-26: 5 mg via ORAL
  Filled 2022-04-24: qty 1

## 2022-04-24 MED ORDER — CEFAZOLIN SODIUM-DEXTROSE 2-4 GM/100ML-% IV SOLN
2.0000 g | Freq: Two times a day (BID) | INTRAVENOUS | Status: DC
  Administered 2022-04-25 – 2022-04-28 (×7): 2 g via INTRAVENOUS
  Filled 2022-04-24 (×9): qty 100

## 2022-04-24 MED ORDER — ACETAMINOPHEN 325 MG PO TABS
650.0000 mg | ORAL_TABLET | Freq: Four times a day (QID) | ORAL | Status: DC | PRN
  Administered 2022-04-25 – 2022-04-26 (×3): 650 mg via ORAL
  Filled 2022-04-24 (×3): qty 2

## 2022-04-24 MED ORDER — ASPIRIN 81 MG PO TBEC: 81.0000 mg | DELAYED_RELEASE_TABLET | Freq: Every day | ORAL | Status: DC | PRN

## 2022-04-24 MED ORDER — FUROSEMIDE 10 MG/ML IJ SOLN
120.0000 mg | Freq: Once | INTRAVENOUS | Status: DC
  Filled 2022-04-24: qty 12

## 2022-04-24 MED ORDER — APIXABAN 5 MG PO TABS
5.0000 mg | ORAL_TABLET | Freq: Two times a day (BID) | ORAL | Status: DC
  Administered 2022-04-25 – 2022-04-28 (×7): 5 mg via ORAL
  Filled 2022-04-24 (×7): qty 1

## 2022-04-24 MED ORDER — MONTELUKAST SODIUM 10 MG PO TABS
10.0000 mg | ORAL_TABLET | Freq: Every evening | ORAL | Status: DC | PRN
  Administered 2022-04-26: 10 mg via ORAL
  Filled 2022-04-24: qty 1

## 2022-04-24 MED ORDER — DULOXETINE HCL 60 MG PO CPEP
60.0000 mg | ORAL_CAPSULE | Freq: Every day | ORAL | Status: DC
  Administered 2022-04-25 – 2022-04-28 (×4): 60 mg via ORAL
  Filled 2022-04-24 (×4): qty 1

## 2022-04-24 MED ORDER — PROCHLORPERAZINE EDISYLATE 10 MG/2ML IJ SOLN: 5.0000 mg | Freq: Four times a day (QID) | INTRAMUSCULAR | Status: DC | PRN

## 2022-04-24 MED ORDER — CARVEDILOL 12.5 MG PO TABS
12.5000 mg | ORAL_TABLET | Freq: Two times a day (BID) | ORAL | Status: DC
  Administered 2022-04-25 – 2022-04-28 (×7): 12.5 mg via ORAL
  Filled 2022-04-24 (×7): qty 1

## 2022-04-24 MED ORDER — POLYETHYLENE GLYCOL 3350 17 G PO PACK: 17.0000 g | PACK | Freq: Every day | ORAL | Status: DC | PRN

## 2022-04-24 NOTE — Progress Notes (Signed)
DAMASCUS, FELDPAUSCH ([id number]) [id number] Page 1 of 4 Visit Report for 04/24/2022 Arrival Information Details Patient Name: Date of Service: [name] RD O. 04/24/2022 2:00 PM Medical Record Number: [id number] Patient Account Number: [id number] Date of Birth/Sex: Treating RN: 1956/01/18 (66 y.o. [name] Primary Care Sapphira Harjo: [name] Other Clinician: Referring Vela Render: Treating Terisha Losasso/Extender: Verita Schneiders in Treatment: 8 Visit Information History Since Last Visit Added or deleted any medications: No Patient Arrived: Ambulatory Any new allergies or adverse reactions: No Arrival Time: 14:28 Had a fall or experienced change in Yes Accompanied By: wife activities of daily living that may affect Transfer Assistance: None risk of falls: Patient Identification Verified: Yes Signs or symptoms of abuse/neglect since last visito No Secondary Verification Process Completed: Yes Hospitalized since last visit: No Patient Requires Transmission-Based Precautions: No Implantable device outside of the clinic excluding No Patient Has Alerts: No cellular tissue based products placed in the center since last visit: Has Dressing in Place as Prescribed: Yes Has Compression in Place as Prescribed: Yes Pain Present Now: No Electronic Signature(s) Signed: 04/24/2022 5:03:48 PM By: Sharyn Creamer RN, BSN Entered By: Sharyn Creamer on 04/24/2022 14:31:23 -------------------------------------------------------------------------------- Compression Therapy Details Patient Name: Date of Service: [name] RD O. 04/24/2022 2:00 PM Medical Record Number: [id number] Patient Account Number: [id number] Date of Birth/Sex: Treating RN: [birth year] (66 y.o. [name] Primary Care [name]: [name] Other Clinician: Referring [name]: Treating [name]/Extender: Verita Schneiders in Treatment:  8 Compression Therapy Performed for Wound Assessment: Wound #1 Anterior Lower Leg Performed By: Clinician Sharyn Creamer, RN Compression Type: Three Layer Electronic Signature(s) Signed: 04/24/2022 5:03:48 PM By: Sharyn Creamer RN, BSN Entered By: Sharyn Creamer on 04/24/2022 14:29:32 -------------------------------------------------------------------------------- Encounter Discharge Information Details Patient Name: Date of Service: [name] RD O. 04/24/2022 2:00 PM Medical Record Number: [id number] Patient Account Number: [id number] [name], Clinton ([id number]) [id number] Page 2 of 4 Date of Birth/Sex: Treating RN: September 18, 1955 (66 y.o. [name] Primary Care Bryanda Mikel: [name] Other Clinician: Referring Zyasia Halbleib: Treating [name]/Extender: Verita Schneiders in Treatment: 8 Encounter Discharge Information Items Discharge Condition: Stable Ambulatory Status: Ambulatory Discharge Destination: Home Transportation: Private Auto Accompanied By: wife Schedule Follow-up Appointment: Yes Clinical Summary of Care: Patient Declined Electronic Signature(s) Signed: 04/24/2022 5:03:48 PM By: Sharyn Creamer RN, BSN Entered By: Sharyn Creamer on 04/24/2022 14:30:20 -------------------------------------------------------------------------------- Patient/Caregiver Education Details Patient Name: Date of Service: [name] RD O. 10/19/2023andnbsp2:00 PM Medical Record Number: [id number] Patient Account Number: [id number] Date of Birth/Gender: Treating RN: 10/21/1955 (66 y.o. [name] Primary Care Physician: [name] Other Clinician: Referring Physician: Treating Physician/Extender: Verita Schneiders in Treatment: 8 Education Assessment Education Provided To: Patient and Caregiver Education Topics Provided Venous: Methods: Explain/Verbal Responses: State content  correctly Wound/Skin Impairment: Methods: Explain/Verbal Responses: State content correctly Electronic Signature(s) Signed: 04/24/2022 5:03:48 PM By: Sharyn Creamer RN, BSN Entered By: Sharyn Creamer on 04/24/2022 14:29:58 -------------------------------------------------------------------------------- Wound Assessment Details Patient Name: Date of Service: [name] RD O. 04/24/2022 2:00 PM Medical Record Number: [id number] Patient Account Number: [id number] Date of Birth/Sex: Treating RN: December 14, 1955 (66 y.o. [name] Primary Care Mikel Hardgrove: [name] Other Clinician: Referring Jacci [name]: Treating Icy Fuhrmann/Extender: Verita Schneiders in Treatment: 8 Wound Status [name], [name] ([id number]) [id number] Page 3 of 4 Wound Number: 1 Primary Venous Leg Ulcer Etiology: Wound Location: Anterior Lower Leg Wound Open Wounding Event: Blister Status: Date  Acquired: 09/25/2021 Comorbid Cataracts, Lymphedema, Sleep Apnea, Congestive Heart Failure, Weeks Of Treatment: 8 History: Coronary Artery Disease, Hypertension, Confinement Anxiety Clustered Wound: Yes Wound Measurements Length: (cm) 15 Width: (cm) 12 Depth: (cm) 0.1 Area: (cm) 141.372 Volume: (cm) 14.137 % Reduction in Area: -2712.3% % Reduction in Volume: -2710.5% Epithelialization: Medium (34-66%) Tunneling: No Undermining: No Wound Description Classification: Full Thickness Without Exposed Support Structures Exudate Amount: Large Exudate Type: Serous Exudate Color: amber Foul Odor After Cleansing: No Slough/Fibrino Yes Wound Bed Granulation Amount: Large (67-100%) Exposed Structure Granulation Quality: Red, Hyper-granulation Fascia Exposed: No Necrotic Amount: Small (1-33%) Fat Layer (Subcutaneous Tissue) Exposed: Yes Necrotic Quality: Adherent Slough Tendon Exposed: No Muscle Exposed: No Joint Exposed: No Bone Exposed: No Periwound Skin  Texture Texture Color No Abnormalities Noted: No No Abnormalities Noted: No Erythema: Yes Moisture Erythema Measurement: Measured No Abnormalities Noted: No 15 cm Maceration: Yes Hemosiderin Staining: Yes Temperature / Pain Temperature: No Abnormality Treatment Notes Wound #1 (Lower Leg) Wound Laterality: Anterior Cleanser Soap and Water Discharge Instruction: May shower and wash wound with dial antibacterial soap and water prior to dressing change. Peri-Wound Care Sween Lotion (Moisturizing lotion) Discharge Instruction: Apply moisturizing lotion as directed Topical Mupirocin Ointment Discharge Instruction: Apply Mupirocin (Bactroban) as instructed Triamcinolone Discharge Instruction: Apply Triamcinolone as directed Primary Dressing KerraCel Ag Gelling Fiber Dressing, 4x5 in (silver alginate) Discharge Instruction: Apply silver alginate to wound bed as instructed Secondary Dressing Zetuvit Plus Silicone Border Dressing 4x4 (in/in) Discharge Instruction: Apply silicone border over primary dressing as directed. Secured With [employer] Surgical T 2x10 (in/yd) ape Discharge Instruction: Secure with tape as directed. Compression Wrap ThreePress (3 layer compression wrap) Discharge Instruction: Apply three layer compression as directed. TERALD, JUMP ([id number]) [id number] Page 4 of 4 Compression Stockings Add-Ons Electronic Signature(s) Signed: 04/24/2022 5:03:48 PM By: Sharyn Creamer RN, BSN Entered By: Sharyn Creamer on 04/24/2022 14:29:08 -------------------------------------------------------------------------------- [place] Details Patient Name: Date of Service: [name] RD O. 04/24/2022 2:00 PM Medical Record Number: [id number] Patient Account Number: [id number] Date of Birth/Sex: Treating RN: February 10, 1956 (66 y.o. [name] Primary Care Lilliauna Van: [name] Other Clinician: Referring Asher Torpey: Treating  Taria Castrillo/Extender: Verita Schneiders in Treatment: 8 Vital Signs Time Taken: 14:07 Temperature (F): 98.1 Height (in): 76 Pulse (bpm): 111 Weight (lbs): 322 Respiratory Rate (breaths/min): 22 Body Mass Index (BMI): 39.2 Blood Pressure (mmHg): 100/64 Reference Range: 80 - 120 mg / dl Electronic Signature(s) Signed: 04/24/2022 5:03:48 PM By: Sharyn Creamer RN, BSN Entered By: Sharyn Creamer on 04/24/2022 14:28:13

## 2022-04-24 NOTE — ED Provider Triage Note (Signed)
Emergency Medicine Provider Triage Evaluation Note  [name] , a 66 y.o. male  was evaluated in triage.  Pt complains of shortness of breath and poor urine output.  Was seen a couple days ago by PCP for routine evaluation.  Labs revealed elevated BUN and creatinine much higher than his baseline.  Was advised by his PCP based on lab results to be seen in the ED yesterday but patient declined.  Shortness of breath worsened today which prompted him to be evaluated today.  Also states in the last week he has had poor urine output.  Patient takes torsemide for CHF.  Also states that he has been more short of breath without chest pain in the last week.  Denies chest pain.  Wife also mentioned that he seems more confused.  MH: CHF, A-fib  Review of Systems  Positive: See above Negative: See above  Physical Exam  BP (!) 151/100 (BP Location: Left Arm)   Pulse 84   Temp 98.9 F (37.2 C) (Oral)   Resp 18   Ht '6\' 4"'$  (1.93 m)   Wt (!) 147 kg   SpO2 95%   BMI 39.45 kg/m  Gen:   Awake, no distress   Resp:  Normal effort  MSK:   Moves extremities without difficulty  Other:    Medical Decision Making  Medically screening exam initiated at 4:15 PM.  Appropriate orders placed.  Avenir Dillon Bjork was informed that the remainder of the evaluation will be completed by another provider, this initial triage assessment does not replace that evaluation, and the importance of remaining in the ED until their evaluation is complete.  Work up initiated   Harriet Pho, PA-C 04/24/22 1619

## 2022-04-24 NOTE — ED Triage Notes (Signed)
Pt reports shob x1 week. Pcp referred to ED due to bun 104 and creatine 5.09.  Hx chf and a-fib on diuretics.

## 2022-04-24 NOTE — Progress Notes (Signed)
CHANZE, TEAGLE ([id number]) [id number] Page 1 of 1 Visit Report for 04/24/2022 Fall Risk Assessment Details Patient Name: Date of Service: [name] RD O. 04/24/2022 2:00 PM Medical Record Number: [id number] Patient Account Number: [id number] Date of Birth/Sex: Treating RN: 1956/05/24 (66 y.o. Mare Ferrari Primary Care Etna Forquer: [name] Other Clinician: Referring Suhayla Chisom: Treating Hutton Pellicane/Extender: Verita Schneiders in Treatment: 8 Fall Risk Assessment Items Have you had 2 or more falls in the last 12 monthso 0 Yes Have you had any fall that resulted in injury in the last 12 monthso 0 Yes FALLS RISK SCREEN History of falling - immediate or within 3 months 25 Yes Secondary diagnosis (Do you have 2 or more medical diagnoseso) 15 Yes Ambulatory aid None/bed rest/wheelchair/nurse 0 Yes Crutches/cane/walker 0 No Furniture 0 No Intravenous therapy Access/Saline/Heparin Lock 0 No Gait/Transferring Normal/ bed rest/ wheelchair 0 No Weak (short steps with or without shuffle, stooped but able to lift head while walking, may seek 10 Yes support from furniture) Impaired (short steps with shuffle, may have difficulty arising from chair, head down, impaired 0 No balance) Mental Status Oriented to own ability 0 No Electronic Signature(s) Signed: 04/24/2022 5:03:48 PM By: Sharyn Creamer RN, BSN Entered By: Sharyn Creamer on 04/24/2022 14:31:18

## 2022-04-24 NOTE — ED Provider Notes (Addendum)
[facility] DEPT Provider Note   CSN: [id number] Arrival date & time: 04/24/22  1544     History  Chief Complaint  Patient presents with   Shortness of Breath    [name] is a 66 y.o. male history of CHF, hypertension here presenting with shortness of breath.  Patient has worsening shortness of breath over the last week or so.  Patient also has worsening bilateral leg swelling.  Patient was encouraged to come to the hospital yesterday.  However he does not want to come the hospital and instead went to his primary care doctor today.  He had labs drawn with BUN of 100 and creatinine of 5 and that is acute for him.  Patient is on torsemide and states that he has been urinating less recently.  He states that his legs has been swollen in particular, his left leg has some discharge.   The history is provided by the patient.       Home Medications Prior to Admission medications   Medication Sig Start Date End Date Taking? Authorizing Provider  carvedilol (COREG) 25 MG tablet Take 2 tablets (50 mg total) by mouth 2 (two) times daily. 10/28/21   Lelon Perla, MD  diltiazem (CARDIZEM CD) 360 MG 24 hr capsule Take 1 capsule (360 mg total) by mouth daily. Please keep scheduled appointment for additional refills. 04/16/22   Lelon Perla, MD  DULoxetine (CYMBALTA) 60 MG capsule SMARTSIG:1 Capsule(s) By Mouth Every Evening 02/26/21   [provider]  ELIQUIS 5 MG TABS tablet TAKE 1 TABLET(5 MG) BY MOUTH TWICE DAILY 09/02/21   Lelon Perla, MD  gabapentin (NEURONTIN) 100 MG capsule Take 2 capsules (200 mg total) by mouth 3 (three) times daily. 04/21/21   Florencia Reasons, MD  hydrALAZINE (APRESOLINE) 100 MG tablet Take 1 tablet (100 mg total) by mouth 3 (three) times daily. 03/26/22   Lelon Perla, MD  rOPINIRole (REQUIP) 1 MG tablet Take 1-2 tablets (1-2 mg total) by mouth at bedtime as needed (restless leg syndrome). 1 tablet after lunch and 2  tablet at bedtime 08/07/21   Lelon Perla, MD  torsemide (DEMADEX) 20 MG tablet Take 3 tablets (60 mg total) by mouth 2 (two) times daily. 10/28/21   Lelon Perla, MD      Allergies    Hydrocodone    Review of Systems   Review of Systems  Respiratory:  Positive for shortness of breath.   Musculoskeletal:        Left leg swelling and pain  All other systems reviewed and are negative.   Physical Exam Updated Vital Signs BP 121/88   Pulse 88   Temp 98.9 F (37.2 C) (Oral)   Resp 17   Ht '6\' 4"'$  (1.93 m)   Wt (!) 147 kg   SpO2 97%   BMI 39.45 kg/m  Physical Exam Vitals and nursing note reviewed.  Constitutional:      Comments: Chronically ill, tachypneic  HENT:     Head: Normocephalic.     Mouth/Throat:     Pharynx: Oropharynx is clear.  Eyes:     Pupils: Pupils are equal, round, and reactive to light.  Cardiovascular:     Rate and Rhythm: Regular rhythm. Tachycardia present.  Pulmonary:     Comments: Tachypneic and crackles bilateral bases Abdominal:     General: Bowel sounds are normal.     Palpations: Abdomen is soft.  Musculoskeletal:     Cervical  back: Normal range of motion and neck supple.     Comments: 2+ edema bilaterally.  Patient has ulcers in the left lower leg with purulent drainage and there is surrounding cellulitis.  There is no obvious subcutaneous air  Skin:    Capillary Refill: Capillary refill takes less than 2 seconds.  Neurological:     General: No focal deficit present.  Psychiatric:        Mood and Affect: Mood normal.        Behavior: Behavior normal.     ED Results / Procedures / Treatments   Labs (all labs ordered are listed, but only abnormal results are displayed) Labs Reviewed  BRAIN NATRIURETIC PEPTIDE - Abnormal; Notable for the following components:      Result Value   B Natriuretic Peptide 626.9 (*)    All other components within normal limits  CBC WITH DIFFERENTIAL/PLATELET - Abnormal; Notable for the following  components:   WBC 11.6 (*)    RBC 3.22 (*)    Hemoglobin 9.3 (*)    HCT 29.2 (*)    Neutro Abs 9.6 (*)    Lymphs Abs 0.6 (*)    Abs Immature Granulocytes 0.17 (*)    All other components within normal limits  BLOOD GAS, VENOUS - Abnormal; Notable for the following components:   pCO2, Ven 38 (*)    pO2, Ven 46 (*)    Bicarbonate 19.6 (*)    Acid-base deficit 6.0 (*)    All other components within normal limits  I-STAT CHEM 8, ED - Abnormal; Notable for the following components:   BUN 115 (*)    Creatinine, Ser 5.50 (*)    Calcium, Ion 1.11 (*)    TCO2 20 (*)    Hemoglobin 9.5 (*)    HCT 28.0 (*)    All other components within normal limits  CULTURE, BLOOD (ROUTINE X 2)  CULTURE, BLOOD (ROUTINE X 2)  LACTIC ACID, PLASMA  COMPREHENSIVE METABOLIC PANEL  URINALYSIS, ROUTINE W REFLEX MICROSCOPIC  TROPONIN I (HIGH SENSITIVITY)  TROPONIN I (HIGH SENSITIVITY)    EKG EKG Interpretation  Date/Time:  Thursday April 24 2022 16:19:17 EDT Ventricular Rate:  90 PR Interval:    QRS Duration: 100 QT Interval:  399 QTC Calculation: 489 R Axis:   48 Text Interpretation: Atrial fibrillation RSR' in V1 or V2, probably normal variant Borderline prolonged QT interval No significant change since last tracing Confirmed by Wandra Arthurs ([id number]) on 04/24/2022 4:46:00 PM  Radiology CT Renal Stone Study  Result Date: 04/24/2022 CLINICAL DATA:  Decreased urine output.  Acute kidney injury. EXAM: CT ABDOMEN AND PELVIS WITHOUT CONTRAST TECHNIQUE: Multidetector CT imaging of the abdomen and pelvis was performed following the standard protocol without IV contrast. RADIATION DOSE REDUCTION: This exam was performed according to the departmental dose-optimization program which includes automated exposure control, adjustment of the mA and/or kV according to patient size and/or use of iterative reconstruction technique. COMPARISON:  01/13/2013 FINDINGS: Lower chest: No acute findings. Hepatobiliary: No mass  visualized on this unenhanced exam. Gallbladder is unremarkable. No evidence of biliary ductal dilatation. Pancreas: No mass or inflammatory process visualized on this unenhanced exam. Spleen:  Within normal limits in size. Adrenals/Urinary tract: Simple appearing right renal cyst is again seen measuring approximately 9 cm (no followup imaging is recommended). Severe bilateral hydroureteronephrosis is seen to the level of the urinary bladder which is new since previous study. No evidence of renal or ureteral calculi. The urinary bladder is also markedly  distended, and this raises suspicion for vesicoureteral reflux and urinary retention. Stomach/Bowel: No evidence of obstruction, inflammatory process, or abnormal fluid collections. Vascular/Lymphatic: No pathologically enlarged lymph nodes identified. No evidence of abdominal aortic aneurysm. Reproductive: Moderately enlarged prostate gland again seen, with mass effect on bladder base. Other:  None. Musculoskeletal:  No suspicious bone lesions identified. IMPRESSION: Severe bilateral hydroureteronephrosis to the level of the urinary bladder, new since previous study. No evidence of ureteral calculi or other obstructing etiology. Markedly distended urinary bladder is suggestive vesicoureteral reflux as the etiology. Recommend clinical correlation for acute urinary retention. Moderately enlarged prostate gland. Electronically Signed   By: Marlaine Hind M.D.   On: 04/24/2022 18:52   DG Tibia/Fibula Left  Result Date: 04/24/2022 CLINICAL DATA:  Left leg cellulitis. Shortness of breath for 1 week. EXAM: LEFT FOOT - COMPLETE 3+ VIEW; LEFT TIBIA AND FIBULA - 2 VIEW COMPARISON:  Left ankle radiographs 10/17/2008 FINDINGS: Left tibia and fibula: Mild medial compartment of the knee joint space narrowing. Old healed fracture of the proximal fibular diaphysis with moderate chronic healing callus formation and mild anterior displacement of the distal component. Mild  patellofemoral joint space narrowing. Moderate anterior and posterior distal tibial plafond and dorsal talar dome-talar neck degenerative spurring. Redemonstration of moderate chronic corticated ossicles adjacent to the distal medial malleolus. Mild distal fibula and adjacent lateral talar process degenerative spurring. Left foot: Mild great toe metatarsophalangeal and diffuse first through fifth interphalangeal joint space narrowing and peripheral osteophytosis. Mild likely chronic spurring at the lateral base of the proximal phalanx of the great toe. No acute fracture or dislocation. Moderate diffuse left calf and foot soft tissue swelling. No cortical erosion is seen. IMPRESSION: 1. Moderate diffuse left calf and foot soft tissue swelling. No cortical erosion is seen to indicate radiographic evidence of acute osteomyelitis. 2. Mild-to-moderate left ankle osteoarthritis. 3. Mild left great toe metatarsophalangeal and interphalangeal joint osteoarthritis. Electronically Signed   By: Yvonne Kendall M.D.   On: 04/24/2022 17:45   DG Foot Complete Left  Result Date: 04/24/2022 CLINICAL DATA:  Left leg cellulitis. Shortness of breath for 1 week. EXAM: LEFT FOOT - COMPLETE 3+ VIEW; LEFT TIBIA AND FIBULA - 2 VIEW COMPARISON:  Left ankle radiographs 10/17/2008 FINDINGS: Left tibia and fibula: Mild medial compartment of the knee joint space narrowing. Old healed fracture of the proximal fibular diaphysis with moderate chronic healing callus formation and mild anterior displacement of the distal component. Mild patellofemoral joint space narrowing. Moderate anterior and posterior distal tibial plafond and dorsal talar dome-talar neck degenerative spurring. Redemonstration of moderate chronic corticated ossicles adjacent to the distal medial malleolus. Mild distal fibula and adjacent lateral talar process degenerative spurring. Left foot: Mild great toe metatarsophalangeal and diffuse first through fifth interphalangeal  joint space narrowing and peripheral osteophytosis. Mild likely chronic spurring at the lateral base of the proximal phalanx of the great toe. No acute fracture or dislocation. Moderate diffuse left calf and foot soft tissue swelling. No cortical erosion is seen. IMPRESSION: 1. Moderate diffuse left calf and foot soft tissue swelling. No cortical erosion is seen to indicate radiographic evidence of acute osteomyelitis. 2. Mild-to-moderate left ankle osteoarthritis. 3. Mild left great toe metatarsophalangeal and interphalangeal joint osteoarthritis. Electronically Signed   By: Yvonne Kendall M.D.   On: 04/24/2022 17:45   DG Chest 2 View  Result Date: 04/24/2022 CLINICAL DATA:  Shortness of breath. EXAM: CHEST - 2 VIEW COMPARISON:  Chest radiograph dated 04/17/2021. FINDINGS: Mild eventration of the right  hemidiaphragm. There is mild cardiomegaly with mild vascular congestion. No focal consolidation, pleural effusion or pneumothorax. Degenerative changes of the spine. No acute osseous pathology. IMPRESSION: Mild cardiomegaly with mild vascular congestion. Electronically Signed   By: Anner Crete M.D.   On: 04/24/2022 17:43    Procedures Procedures    CRITICAL CARE Performed by: Wandra Arthurs   Total critical care time: 30 minutes  Critical care time was exclusive of separately billable procedures and treating other patients.  Critical care was necessary to treat or prevent imminent or life-threatening deterioration.  Critical care was time spent personally by me on the following activities: development of treatment plan with patient and/or surrogate as well as nursing, discussions with consultants, evaluation of patient's response to treatment, examination of patient, obtaining history from patient or surrogate, ordering and performing treatments and interventions, ordering and review of laboratory studies, ordering and review of radiographic studies, pulse oximetry and re-evaluation of patient's  condition.   Medications Ordered in ED Medications  furosemide (LASIX) 120 mg in dextrose 5 % 50 mL IVPB (has no administration in time range)  ceFAZolin (ANCEF) IVPB 2g/100 mL premix (2 g Intravenous New Bag/Given 04/24/22 1802)    ED Course/ Medical Decision Making/ A&P                           Medical Decision Making BASSEM BERNASCONI is a 66 y.o. male here presenting with shortness of breath and leg swelling and acute renal failure.  Patient is on diuretics unfortunately has not been urinating well.  Patient has bilateral leg swelling appears very volume overloaded.  Patient is not on dialysis.  We will get CT renal stone to rule out hydronephrosis and make sure he is not in retention.  Patient will likely need aggressive diuresis.  We will also consult nephrology.  6:21 PM Reviewed patient's labs and independently interpreted CT scan.  Patient's creatinine is 5.5.  Patient's BUN is 115.  BNP is elevated at 600.  Chest x-ray showed pulmonary edema.  VBG is reassuring.  CT showed bilateral hydronephrosis.  Foley catheter was placed.  I also consulted Dr. Joelyn Oms from nephrology.  He recommend IV Lasix for diuresis. He will see patient as a consult.  I recommend transfer to [facility] in case he needs to be started on dialysis.  However the renal failure likely postobstructive in nature. He also has a cellulitis in the left leg and I ordered Ancef.  There is no subcutaneous air on x-ray.  This point, hospitalist to admit for renal failure and leg cellulitis.  If there is not much coming out of Foley, he will likely need IV Lasix.  7:52 PM Dr. Carmina Miller called back and he reviewed the CT scan.  He states that patient will just need a Foley and can stay at [facility] long.  If his kidney function does not improve, then nephrology consult in the morning.  Otherwise he likely has renal failure from bladder obstruction.   8:41 PM 3 L came out of the Foley. Per Dr. Carmina Miller, hold off on lasix for now.     Problems Addressed: Left leg cellulitis: acute illness or injury Urinary retention: acute illness or injury  Amount and/or Complexity of Data Reviewed Labs: ordered. Decision-making details documented in ED Course. Radiology: ordered and independent interpretation performed. Decision-making details documented in ED Course. ECG/medicine tests: ordered and independent interpretation performed. Decision-making details documented in ED Course.  Risk Prescription drug  management. Decision regarding hospitalization.    Final Clinical Impression(s) / ED Diagnoses Final diagnoses:  None    Rx / DC Orders ED Discharge Orders     None         Drenda Freeze, MD 04/24/22 1924    Drenda Freeze, MD 04/24/22 Earney Navy    Drenda Freeze, MD 04/24/22 2042

## 2022-04-24 NOTE — Progress Notes (Signed)
BLANTON, KARDELL ([id number]) [id number] Page 1 of 1 Visit Report for 04/24/2022 SuperBill Details Patient Name: Date of Service: [facility] RD O. 04/24/2022 Medical Record Number: [id number] Patient Account Number: [id number] Date of Birth/Sex: Treating RN: September 14, 1955 (66 y.o. Mare Ferrari Primary Care Provider: [name] Other Clinician: Referring Provider: Treating Provider/Extender: Verita Schneiders in Treatment: 8 Diagnosis Coding ICD-10 Codes Code Description R60.0 Localized edema Y51.83 Chronic systolic (congestive) heart failure I42.0 Dilated cardiomyopathy E66.01 Morbid (severe) obesity due to excess calories I10 Essential (primary) hypertension L97.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 other part of left lower leg limited to breakdown of skin Facility Procedures CPT4 Code Description Modifier Quantity [id number] (Facility Use Only) [phone] - APPLY [place] [address] Electronic Signature(s) Signed: 04/24/2022 4:29:29 PM By: Linton Ham MD Signed: 04/24/2022 5:03:48 PM By: Sharyn Creamer RN, BSN Entered By: Sharyn Creamer on 04/24/2022 14:30:32

## 2022-04-24 NOTE — H&P (Addendum)
History and Physical  [name] MBT:[id number] DOB: March 19, 1956 DOA: 04/24/2022  Referring physician: Dr. Darl Householder  PCP: [name], MD  Outpatient Specialists: Cardiology, wound care. Patient coming from: Home.  Chief Complaint: Shortness of breath  HPI: [name] is a 66 y.o. male with medical history significant for severe morbid obesity, chronic diastolic CHF, chronic left lower extremity wound followed by wound care clinic, permanent atrial fibrillation on Eliquis, moderate to severe mitral regurgitation and mild tricuspid regurgitation, hypertension, polyneuropathy, restless leg syndrome, chronic anxiety/depression, who presented to [facility] ED from home due to progressively worsening shortness of breath for the past week.  Associated with worsening bilateral lower extremity edema.  The patient went to wound care provider 3 days ago and was encouraged to come to the ED for further evaluation of his lower extremity edema.  The patient did not want to come to the hospital, instead went to his primary care provider on the day of presentation.  Labs drawn revealed a BUN greater than 100 and creatinine greater than 5.  Endorses low urine output.  The patient's PCP referred him to the ED for further evaluation.  In the ED, patient noted to be significantly volume overload with acute urinary retention and AKI.  Also noted to have left lower extremity cellulitis for which he was started on cefazolin.  EDP discussed the case with nephrology who recommended Foley catheter insertion for now and hold off IV diuresing.  A Foley catheter was inserted with 3L urine output.  The patient was admitted by [facility], hospitalist service.  ED Course: Tmax 98.9.  BP 161/98, pulse 97, respiration rate 17, O2 saturation 100% on room air.  Lab studies remarkable for serum bicarb 18, anion gap 10.  BUN 115 and Creatinine 5.50.  BNP 626.9.  Troponin 13.  Hemoglobin 9.5.  WBC 11.6.  UA is pending.  Review of  Systems: Review of systems as noted in the HPI. All other systems reviewed and are negative.   Past Medical History:  Diagnosis Date   Allergic rhinitis    Anxiety    Asthma    as a child   Chronic low back pain    Complication of anesthesia    "hard to put asleep, hard to wake up, nausea and vomiting"   Depression    ED (erectile dysfunction)    GERD (gastroesophageal reflux disease)    HLD (hyperlipidemia)    HTN (hypertension)    sees Dr. [name], [facility]   Hypertrophy of prostate with urinary obstruction and other lower urinary tract symptoms (LUTS)    IBS (irritable bowel syndrome)    Inguinal hernia without mention of obstruction or gangrene, unilateral or unspecified, (not specified as recurrent)    Insomnia    Lumbar herniated disc    L7   Morbid obesity ([place])    Narcotic addiction ([place])    NICM (nonischemic cardiomyopathy) ([place])    tachycardia mediated,  resolved with sinus   Persistent atrial fibrillation ([place])    ablation done 03/2006 at [facility]   Pneumonia    hx of 2004   PONV (postoperative nausea and vomiting)    Twitching    legs   Past Surgical History:  Procedure Laterality Date   ATRIAL ABLATION SURGERY  2007   [facility]   CARDIOVERSION  08/11/2011   Procedure: CARDIOVERSION;  Surgeon: Lelon Perla, MD;  Location: [place];  Service: Cardiovascular;  Laterality: N/A;   CARDIOVERSION N/A 04/16/2017   Procedure: CARDIOVERSION;  Surgeon: Sanda Klein, MD;  Location: [place];  Service: Cardiovascular;  Laterality: N/A;   CARDIOVERSION N/A 03/04/2019   Procedure: CARDIOVERSION;  Surgeon: Lelon Perla, MD;  Location: [facility] ENDOSCOPY;  Service: Cardiovascular;  Laterality: N/A;   CARDIOVERSION N/A 07/04/2019   Procedure: CARDIOVERSION;  Surgeon: [name], MD;  Location: [place];  Service: Cardiovascular;  Laterality: N/A;   EYE SURGERY     lasik, [place] Right 08/26/2012    Procedure: LAPAROSCOPIC INGUINAL HERNIA;  Surgeon: Madilyn Hook, DO;  Location: [place];  Service: General;  Laterality: Right;  laparoscopic right inguinal hernia repair with mesh, umbilical hernia repair   INSERTION OF MESH Right 08/26/2012   Procedure: INSERTION OF MESH;  Surgeon: Madilyn Hook, DO;  Location: [place];  Service: General;  Laterality: Right;  right inguinal hernia   TEE WITHOUT CARDIOVERSION N/A 03/04/2019   Procedure: TRANSESOPHAGEAL ECHOCARDIOGRAM (TEE);  Surgeon: Lelon Perla, MD;  Location: [place];  Service: Cardiovascular;  Laterality: N/A;   UMBILICAL HERNIA REPAIR N/A 08/26/2012   Procedure: HERNIA REPAIR UMBILICAL ADULT;  Surgeon: Madilyn Hook, DO;  Location: [place];  Service: General;  Laterality: N/A;    Social History:  reports that he has never smoked. He has never used smokeless tobacco. He reports that he does not drink alcohol and does not use drugs.   Allergies  Allergen Reactions   Hydrocodone Other (See Comments)    GI upset, dizziness    Family History  Problem Relation Age of Onset   Asthma Mother    Breast cancer Mother    Hypertension Mother    COPD Father    Prostate cancer Father    Hypertension Father    Lymphoma Sister       Prior to Admission medications   Medication Sig Start Date End Date Taking? Authorizing Provider  carvedilol (COREG) 25 MG tablet Take 2 tablets (50 mg total) by mouth 2 (two) times daily. 10/28/21   Lelon Perla, MD  diltiazem (CARDIZEM CD) 360 MG 24 hr capsule Take 1 capsule (360 mg total) by mouth daily. Please keep scheduled appointment for additional refills. 04/16/22   Lelon Perla, MD  DULoxetine (CYMBALTA) 60 MG capsule SMARTSIG:1 Capsule(s) By Mouth Every Evening 02/26/21   [provider]  ELIQUIS 5 MG TABS tablet TAKE 1 TABLET(5 MG) BY MOUTH TWICE DAILY 09/02/21   Lelon Perla, MD  gabapentin (NEURONTIN) 100 MG capsule Take 2 capsules (200 mg total) by mouth 3 (three) times daily.  04/21/21   Florencia Reasons, MD  hydrALAZINE (APRESOLINE) 100 MG tablet Take 1 tablet (100 mg total) by mouth 3 (three) times daily. 03/26/22   Lelon Perla, MD  rOPINIRole (REQUIP) 1 MG tablet Take 1-2 tablets (1-2 mg total) by mouth at bedtime as needed (restless leg syndrome). 1 tablet after lunch and 2 tablet at bedtime 08/07/21   Lelon Perla, MD  torsemide (DEMADEX) 20 MG tablet Take 3 tablets (60 mg total) by mouth 2 (two) times daily. 10/28/21   Lelon Perla, MD    Physical Exam: BP 121/88   Pulse 88   Temp 98.9 F (37.2 C) (Oral)   Resp 17   Ht '6\' 4"'$  (1.93 m)   Wt (!) 147 kg   SpO2 97%   BMI 39.45 kg/m   General: 66 y.o. year-old male well developed well nourished in no acute distress.  Somnolent but  arousable to voices. Cardiovascular: Irregular rate and rhythm with no rubs or gallops.  No thyromegaly or JVD noted.  2+ pitting edema in lower extremities bilaterally.  Respiratory: Mild rales at bases.  Poor respiratory effort. Abdomen: Severely obese, nontender, with normal bowel sounds x4 quadrants. Muskuloskeletal: No cyanosis or clubbing.  2+ pitting edema lower extremities bilaterally. Neuro: CN II-XII intact, strength, sensation, reflexes Skin: Left lower extremity wound with surrounding erythema, warmth and tenderness. Psychiatry: Judgement and insight appear normal. Mood is appropriate for condition and setting          Labs on Admission:  Basic Metabolic Panel: Recent Labs  Lab 04/24/22 1707  NA 139  K 4.1  CL 107  GLUCOSE 96  BUN 115*  CREATININE 5.50*   Liver Function Tests: No results for input(s): "AST", "ALT", "ALKPHOS", "BILITOT", "PROT", "ALBUMIN" in the last 168 hours. No results for input(s): "LIPASE", "AMYLASE" in the last 168 hours. No results for input(s): "AMMONIA" in the last 168 hours. CBC: Recent Labs  Lab 04/24/22 1646 04/24/22 1707  WBC 11.6*  --   NEUTROABS 9.6*  --   HGB 9.3* 9.5*  HCT 29.2* 28.0*  MCV 90.7  --   PLT 235   --    Cardiac Enzymes: No results for input(s): "CKTOTAL", "CKMB", "CKMBINDEX", "TROPONINI" in the last 168 hours.  BNP (last 3 results) Recent Labs    12/11/21 1453 04/24/22 1646  BNP 54 626.9*    ProBNP (last 3 results) No results for input(s): "PROBNP" in the last 8760 hours.  CBG: No results for input(s): "GLUCAP" in the last 168 hours.  Radiological Exams on Admission: CT Renal Stone Study  Result Date: 04/24/2022 CLINICAL DATA:  Decreased urine output.  Acute kidney injury. EXAM: CT ABDOMEN AND PELVIS WITHOUT CONTRAST TECHNIQUE: Multidetector CT imaging of the abdomen and pelvis was performed following the standard protocol without IV contrast. RADIATION DOSE REDUCTION: This exam was performed according to the departmental dose-optimization program which includes automated exposure control, adjustment of the mA and/or kV according to patient size and/or use of iterative reconstruction technique. COMPARISON:  01/13/2013 FINDINGS: Lower chest: No acute findings. Hepatobiliary: No mass visualized on this unenhanced exam. Gallbladder is unremarkable. No evidence of biliary ductal dilatation. Pancreas: No mass or inflammatory process visualized on this unenhanced exam. Spleen:  Within normal limits in size. Adrenals/Urinary tract: Simple appearing right renal cyst is again seen measuring approximately 9 cm (no followup imaging is recommended). Severe bilateral hydroureteronephrosis is seen to the level of the urinary bladder which is new since previous study. No evidence of renal or ureteral calculi. The urinary bladder is also markedly distended, and this raises suspicion for vesicoureteral reflux and urinary retention. Stomach/Bowel: No evidence of obstruction, inflammatory process, or abnormal fluid collections. Vascular/Lymphatic: No pathologically enlarged lymph nodes identified. No evidence of abdominal aortic aneurysm. Reproductive: Moderately enlarged prostate gland again seen, with  mass effect on bladder base. Other:  None. Musculoskeletal:  No suspicious bone lesions identified. IMPRESSION: Severe bilateral hydroureteronephrosis to the level of the urinary bladder, new since previous study. No evidence of ureteral calculi or other obstructing etiology. Markedly distended urinary bladder is suggestive vesicoureteral reflux as the etiology. Recommend clinical correlation for acute urinary retention. Moderately enlarged prostate gland. Electronically Signed   By: Marlaine Hind M.D.   On: 04/24/2022 18:52   DG Tibia/Fibula Left  Result Date: 04/24/2022 CLINICAL DATA:  Left leg cellulitis. Shortness of breath for 1 week. EXAM: LEFT FOOT - COMPLETE 3+ VIEW;  LEFT TIBIA AND FIBULA - 2 VIEW COMPARISON:  Left ankle radiographs 10/17/2008 FINDINGS: Left tibia and fibula: Mild medial compartment of the knee joint space narrowing. Old healed fracture of the proximal fibular diaphysis with moderate chronic healing callus formation and mild anterior displacement of the distal component. Mild patellofemoral joint space narrowing. Moderate anterior and posterior distal tibial plafond and dorsal talar dome-talar neck degenerative spurring. Redemonstration of moderate chronic corticated ossicles adjacent to the distal medial malleolus. Mild distal fibula and adjacent lateral talar process degenerative spurring. Left foot: Mild great toe metatarsophalangeal and diffuse first through fifth interphalangeal joint space narrowing and peripheral osteophytosis. Mild likely chronic spurring at the lateral base of the proximal phalanx of the great toe. No acute fracture or dislocation. Moderate diffuse left calf and foot soft tissue swelling. No cortical erosion is seen. IMPRESSION: 1. Moderate diffuse left calf and foot soft tissue swelling. No cortical erosion is seen to indicate radiographic evidence of acute osteomyelitis. 2. Mild-to-moderate left ankle osteoarthritis. 3. Mild left great toe metatarsophalangeal  and interphalangeal joint osteoarthritis. Electronically Signed   By: Yvonne Kendall M.D.   On: 04/24/2022 17:45   DG Foot Complete Left  Result Date: 04/24/2022 CLINICAL DATA:  Left leg cellulitis. Shortness of breath for 1 week. EXAM: LEFT FOOT - COMPLETE 3+ VIEW; LEFT TIBIA AND FIBULA - 2 VIEW COMPARISON:  Left ankle radiographs 10/17/2008 FINDINGS: Left tibia and fibula: Mild medial compartment of the knee joint space narrowing. Old healed fracture of the proximal fibular diaphysis with moderate chronic healing callus formation and mild anterior displacement of the distal component. Mild patellofemoral joint space narrowing. Moderate anterior and posterior distal tibial plafond and dorsal talar dome-talar neck degenerative spurring. Redemonstration of moderate chronic corticated ossicles adjacent to the distal medial malleolus. Mild distal fibula and adjacent lateral talar process degenerative spurring. Left foot: Mild great toe metatarsophalangeal and diffuse first through fifth interphalangeal joint space narrowing and peripheral osteophytosis. Mild likely chronic spurring at the lateral base of the proximal phalanx of the great toe. No acute fracture or dislocation. Moderate diffuse left calf and foot soft tissue swelling. No cortical erosion is seen. IMPRESSION: 1. Moderate diffuse left calf and foot soft tissue swelling. No cortical erosion is seen to indicate radiographic evidence of acute osteomyelitis. 2. Mild-to-moderate left ankle osteoarthritis. 3. Mild left great toe metatarsophalangeal and interphalangeal joint osteoarthritis. Electronically Signed   By: Yvonne Kendall M.D.   On: 04/24/2022 17:45   DG Chest 2 View  Result Date: 04/24/2022 CLINICAL DATA:  Shortness of breath. EXAM: CHEST - 2 VIEW COMPARISON:  Chest radiograph dated 04/17/2021. FINDINGS: Mild eventration of the right hemidiaphragm. There is mild cardiomegaly with mild vascular congestion. No focal consolidation, pleural  effusion or pneumothorax. Degenerative changes of the spine. No acute osseous pathology. IMPRESSION: Mild cardiomegaly with mild vascular congestion. Electronically Signed   By: Anner Crete M.D.   On: 04/24/2022 17:43    EKG: I independently viewed the EKG done and my findings are as followed:  Afib With rate of 90.  Nonspecific ST-T changes.  QTc 489.  Assessment/Plan Present on Admission:  Volume overload  Principal Problem:   Volume overload  Volume overload multifactorial, acute CHF versus worsening renal failure. Last 2D echo done on 04/02/2021 revealed LVEF 55 to 60% Creatinine 5.50 from baseline 2.0 Hold off IV diuresis Lasix 120 mg x 1 as recommended by nephrology. Per nephrology should auto diurese.  Monitor urine output with strict I's and O's  Acute diastolic  CHF Elevated BNP on presentation greater than 600 Obtain 2D echo IV diuresing on hold per nephrology. Monitor strict I's and O's and daily weight  AKI on CKD 3B with acute urinary retention Baseline creatinine appears to be 2.0 Presented with creatinine of 5.50 with GFR of 11 Acute urinary retention, Foley catheter placed on 04/24/2022. Monitor urine output with strict I's and O's. Avoid nephrotoxic agents and hypotension. Repeat renal panel in the morning.  Left lower extremity cellulitis Continue cefazolin started in the ED Follow blood cultures. Monitor fever curve and WBC.  Anemia of chronic disease in the setting of CKD Presented with hemoglobin of 9.5, MCV 90. Obtain iron studies  Mild leukocytosis WBC 11.6 UA pending.  Morbid obesity BMI 39 Recommend weight loss outpatient with regular physical activity and healthy dieting.  Hypertension Resume home oral antihypertensives when indicated. Monitor vital signs.  Chronic anxiety/depression Resume home duloxetine.  Permanent atrial fibrillation Currently rate controlled Resume home Eliquis for CVA prevention   Critical care time: 65  minutes.   DVT prophylaxis: Home Eliquis  Code Status: Full code  Family Communication: Updated the patient's wife at bedside  Disposition Plan: Admitted to progressive care unit.  Consults called: Nephrology by EDP.  Admission status: Inpatient status.   Status is: Inpatient The patient requires at least 2 midnights for further evaluation and treatment of present condition.   Kayleen Memos MD [facility] Pager [phone]  If 7PM-7AM, please contact night-coverage [email or web address] Password [email or web address]  04/24/2022, 7:26 PM

## 2022-04-24 NOTE — Progress Notes (Signed)
A consult was received from an ED physician for cefazolin per pharmacy dosing.  The patient's profile has been reviewed for ht/wt/allergies/indication/available labs.    A one time order has been placed for cefazolin 2 gm IV x 1 dose.   Further antibiotics/pharmacy consults should be ordered by admitting physician if indicated.                       Thank you,  Eudelia Bunch, Pharm.D 04/24/2022 5:10 PM

## 2022-04-25 ENCOUNTER — Inpatient Hospital Stay (HOSPITAL_COMMUNITY): Payer: 59

## 2022-04-25 DIAGNOSIS — L03116 Cellulitis of left lower limb: Secondary | ICD-10-CM

## 2022-04-25 DIAGNOSIS — I5031 Acute diastolic (congestive) heart failure: Secondary | ICD-10-CM | POA: Diagnosis not present

## 2022-04-25 DIAGNOSIS — E8770 Fluid overload, unspecified: Secondary | ICD-10-CM | POA: Diagnosis not present

## 2022-04-25 DIAGNOSIS — R339 Retention of urine, unspecified: Secondary | ICD-10-CM | POA: Diagnosis not present

## 2022-04-25 LAB — ECHOCARDIOGRAM COMPLETE
AR max vel: 3.23 cm2
AV Area VTI: 2.93 cm2
AV Area mean vel: 3.03 cm2
AV Mean grad: 4 mmHg
AV Peak grad: 7.3 mmHg
Ao pk vel: 1.35 m/s
Height: 76 in
MV M vel: 4.37 m/s
MV Peak grad: 76.2 mmHg
S' Lateral: 3.7 cm
Weight: 5601.6 oz

## 2022-04-25 LAB — RENAL FUNCTION PANEL
Albumin: 2.8 g/dL — ABNORMAL LOW (ref 3.5–5.0)
Anion gap: 10 (ref 5–15)
BUN: 96 mg/dL — ABNORMAL HIGH (ref 8–23)
CO2: 20 mmol/L — ABNORMAL LOW (ref 22–32)
Calcium: 8.4 mg/dL — ABNORMAL LOW (ref 8.9–10.3)
Chloride: 110 mmol/L (ref 98–111)
Creatinine, Ser: 4.68 mg/dL — ABNORMAL HIGH (ref 0.61–1.24)
GFR, Estimated: 13 mL/min — ABNORMAL LOW (ref >60)
Glucose, Bld: 179 mg/dL — ABNORMAL HIGH (ref 70–99)
Phosphorus: 4.8 mg/dL — ABNORMAL HIGH (ref 2.5–4.6)
Potassium: 3.9 mmol/L (ref 3.5–5.1)
Sodium: 140 mmol/L (ref 135–145)

## 2022-04-25 LAB — BASIC METABOLIC PANEL
Anion gap: 10 (ref 5–15)
BUN: 98 mg/dL — ABNORMAL HIGH (ref 8–23)
CO2: 19 mmol/L — ABNORMAL LOW (ref 22–32)
Calcium: 8.3 mg/dL — ABNORMAL LOW (ref 8.9–10.3)
Chloride: 107 mmol/L (ref 98–111)
Creatinine, Ser: 4.52 mg/dL — ABNORMAL HIGH (ref 0.61–1.24)
GFR, Estimated: 14 mL/min — ABNORMAL LOW (ref >60)
Glucose, Bld: 119 mg/dL — ABNORMAL HIGH (ref 70–99)
Potassium: 3.7 mmol/L (ref 3.5–5.1)
Sodium: 136 mmol/L (ref 135–145)

## 2022-04-25 LAB — CBC
HCT: 32.7 % — ABNORMAL LOW (ref 39.0–52.0)
Hemoglobin: 10.4 g/dL — ABNORMAL LOW (ref 13.0–17.0)
MCH: 29.1 pg (ref 26.0–34.0)
MCHC: 31.8 g/dL (ref 30.0–36.0)
MCV: 91.3 fL (ref 80.0–100.0)
Platelets: 296 10*3/uL (ref 150–400)
RBC: 3.58 MIL/uL — ABNORMAL LOW (ref 4.22–5.81)
RDW: 15 % (ref 11.5–15.5)
WBC: 9.6 10*3/uL (ref 4.0–10.5)
nRBC: 0 % (ref 0.0–0.2)

## 2022-04-25 LAB — MAGNESIUM: Magnesium: 2.3 mg/dL (ref 1.7–2.4)

## 2022-04-25 IMAGING — DX DG CHEST 1V PORT
1 series · 1 of 1 positions shown · non-contrast
Comparison: 04/16/2021

CLINICAL DATA: Acute respiratory disease

EXAM:
PORTABLE CHEST 1 VIEW

[chest ap]
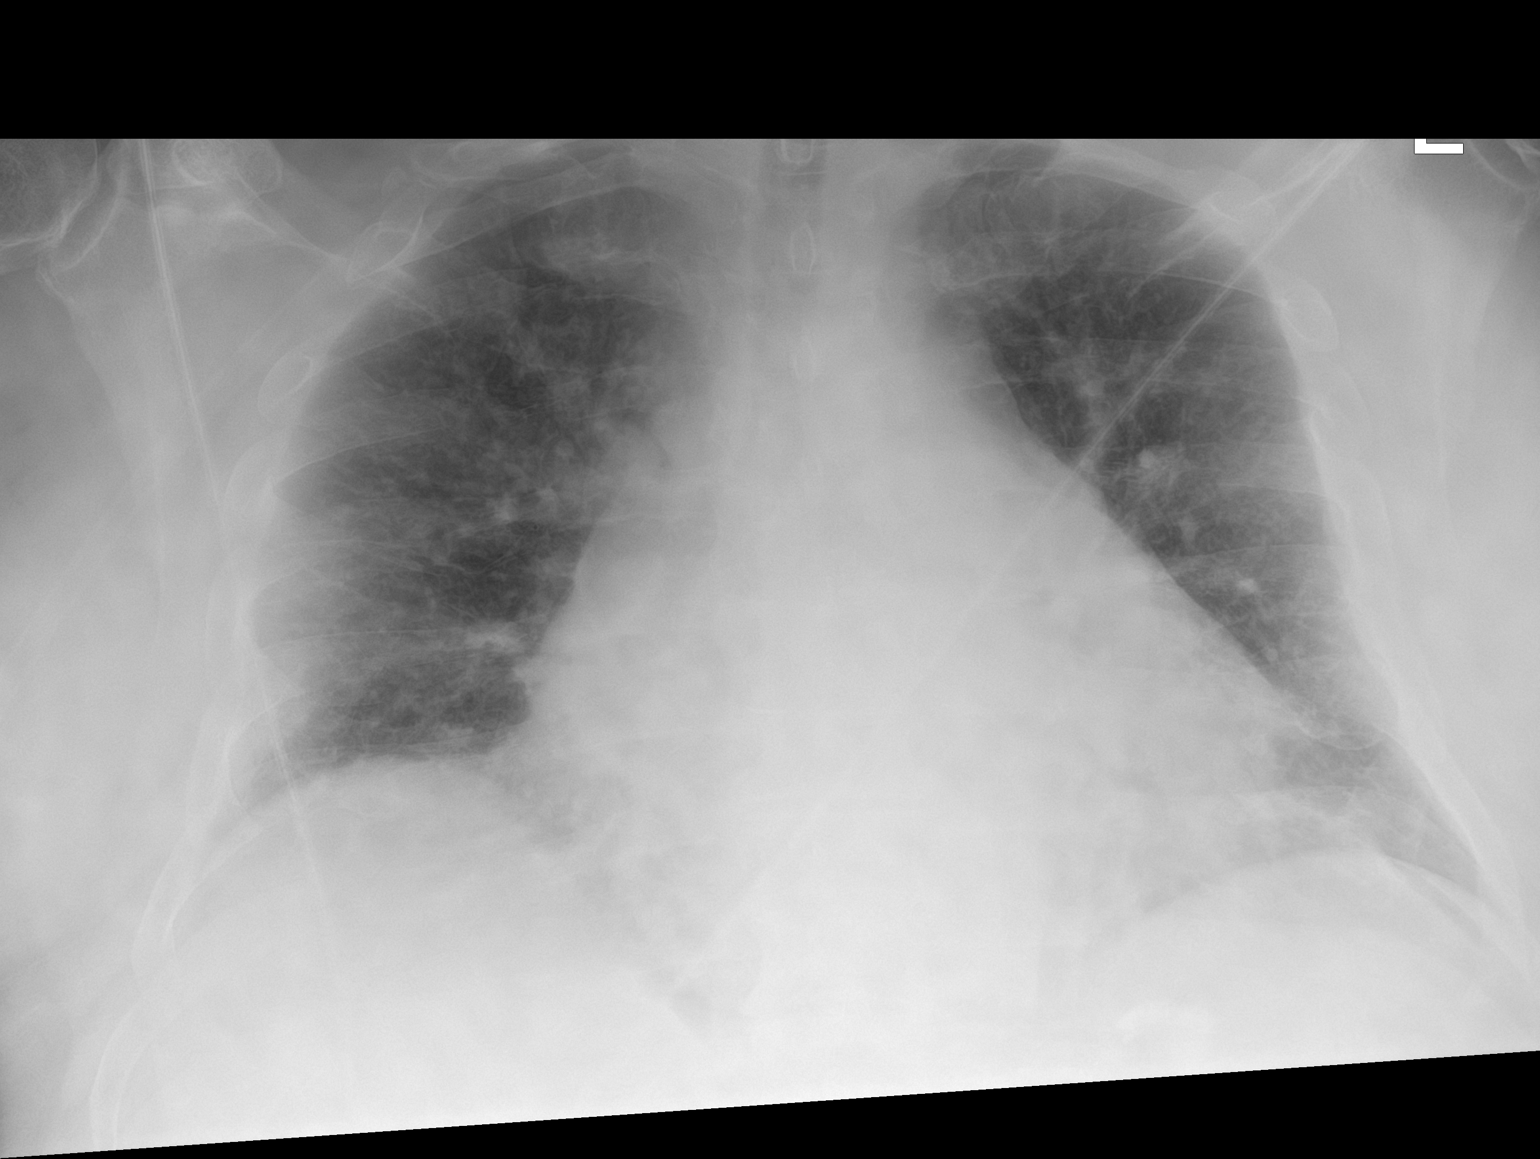

[1 of 1 positions shown; findings below may reference images not displayed]

FINDINGS: Shallow inspiration. Cardiac enlargement. Probable central pulmonary
vascular congestion. No airspace disease or consolidation. No
pleural effusions. No pneumothorax. Mediastinal contours appear
intact.
IMPRESSION: Cardiac enlargement with mild vascular congestion. No edema or
consolidation.

## 2022-04-25 MED ORDER — LORAZEPAM 2 MG/ML IJ SOLN
0.5000 mg | Freq: Once | INTRAMUSCULAR | Status: AC
  Administered 2022-04-25: 0.5 mg via INTRAVENOUS
  Filled 2022-04-25: qty 1

## 2022-04-25 MED ORDER — TIZANIDINE HCL 4 MG PO TABS
4.0000 mg | ORAL_TABLET | Freq: Three times a day (TID) | ORAL | Status: DC | PRN
  Administered 2022-04-25 – 2022-04-27 (×2): 4 mg via ORAL
  Filled 2022-04-25 (×3): qty 1

## 2022-04-25 MED ORDER — LORAZEPAM 0.5 MG PO TABS
0.5000 mg | ORAL_TABLET | Freq: Three times a day (TID) | ORAL | Status: DC | PRN
  Administered 2022-04-25 – 2022-04-26 (×2): 0.5 mg via ORAL
  Filled 2022-04-25 (×2): qty 1

## 2022-04-25 MED ORDER — TAMSULOSIN HCL 0.4 MG PO CAPS
0.4000 mg | ORAL_CAPSULE | Freq: Every day | ORAL | Status: DC
  Administered 2022-04-25 – 2022-04-27 (×3): 0.4 mg via ORAL
  Filled 2022-04-25 (×3): qty 1

## 2022-04-25 MED ORDER — DILTIAZEM HCL ER COATED BEADS 180 MG PO CP24
360.0000 mg | ORAL_CAPSULE | Freq: Every day | ORAL | Status: DC
  Administered 2022-04-25 – 2022-04-28 (×4): 360 mg via ORAL
  Filled 2022-04-25 (×4): qty 2

## 2022-04-25 MED ORDER — ROPINIROLE HCL 1 MG PO TABS
2.0000 mg | ORAL_TABLET | Freq: Every day | ORAL | Status: DC
  Administered 2022-04-25 – 2022-04-27 (×3): 2 mg via ORAL
  Filled 2022-04-25 (×3): qty 2

## 2022-04-25 MED ORDER — ROPINIROLE HCL 1 MG PO TABS
1.0000 mg | ORAL_TABLET | Freq: Every day | ORAL | Status: DC
  Administered 2022-04-25 – 2022-04-28 (×4): 1 mg via ORAL
  Filled 2022-04-25 (×4): qty 1

## 2022-04-25 NOTE — ED Notes (Signed)
Unsuccessful lab draw x2. RN aware.

## 2022-04-25 NOTE — Progress Notes (Addendum)
[facility]                                                                               Michaela Shankel, is a 66 y.o. male, DOB - [birth year], TMH:[id number] Admit date - 04/24/2022    Outpatient Primary MD for the patient is [name], MD  LOS - 1  days    Brief summary   KAYNEN MINNER is a 66 y.o. male with medical history significant for severe morbid obesity, chronic diastolic CHF, chronic left lower extremity wound followed by wound care clinic, permanent atrial fibrillation on Eliquis, moderate to severe mitral regurgitation and mild tricuspid regurgitation, hypertension, polyneuropathy, restless leg syndrome, chronic anxiety/depression, who presented to [facility] ED from home due to progressively worsening shortness of breath for the past week, associated with worsening bilateral lower extremity edema.  In the ED, patient noted to be significantly volume overload with acute urinary retention and AKI.  Also noted to have left lower extremity cellulitis for which he was started on cefazolin.  EDP discussed the case with nephrology who recommended Foley catheter insertion for now and hold off IV diuresing.  A Foley catheter was inserted with 3L urine output.  The patient was admitted by [facility], hospitalist service.   Assessment & Plan    Assessment and Plan:  Volume overload secondary to acute on stage 3 b CKD due to Urinary Retention from enlarged prostate.  Foley catheter was placed and 3 lit came out.  Nephrology consulted by EDP recommended, monitoring renal parameters and if no improvement to call nephrology consult .  Will recheck renal parameters this morning.  CT renal study shows Severe bilateral hydroureteronephrosis to the level of the urinary bladder, new since previous study. No evidence of ureteral calculi or other obstructing etiology. Markedly distended urinary bladder is suggestive vesicoureteral reflux as the etiology. Hold diuretics for now as per  nephrology.  Potassium is wnl.  Bicarb is 20 today.  Creatinine improved slightly from 5.5 to 4.68.  Hold home torsemide and hydrochlorothiazide.  UA shows small leukocytes, no bacteria and wbc wnl, negative nitrites.     Body mass index is 42.62 kg/m. Severe Morbid Obesity.  Recommend outpatient follow up with PCP for weight loss .    Chronic left lower extremity wound:  Wound care consult for recommendations.  X rays show Moderate diffuse left calf and foot soft tissue swelling. No cortical erosion is seen to indicate radiographic evidence of acute osteomyelitis. Pt empirically started on cefazolin.      Chronic diastolic heart failure:  Though bnp is slightly elevated.  Pt denies any sob this morning but is on 2 lit of [place] oxygen.  Echocardiogram today shows no change compared to the one from 03/2021.    Hypertension:  BP parameters are optimal   Permanent atrial fibrillation with RVR  Rate control with cardizem and on eliquis for anti coagulation.    Polyneuropathy:  Pt is on gabapentin 300 mg TID.  Due to AKI, will decrease the dose to 100 mg TID .     Restless leg syndrome:  Resume requip.     Chronic anxiety and depression:  Resume home meds .  Estimated body mass index is 42.62 kg/m as calculated from the following:   Height as of this encounter: '6\' 4"'$  (1.93 m).   Weight as of this encounter: 158.8 kg.  Code Status: full code.  DVT Prophylaxis:   apixaban (ELIQUIS) tablet 5 mg   Level of Care: Level of care: Progressive Family Communication: none at bedside,   Disposition Plan:     Remains inpatient appropriate:  waiting for renal function to improve.   Procedures:  CT renal study.  Echocardiogram.   Consultants:   None.   Antimicrobials:   Anti-infectives (From admission, onward)    Start     Dose/Rate Route Frequency Ordered Stop   04/25/22 0600  ceFAZolin (ANCEF) IVPB 2g/100 mL premix        2 g 200 mL/hr over 30 Minutes  Intravenous Every 12 hours 04/24/22 2055     04/24/22 1730  ceFAZolin (ANCEF) IVPB 2g/100 mL premix        2 g 200 mL/hr over 30 Minutes Intravenous  Once 04/24/22 1708 04/24/22 2133        Medications  Scheduled Meds:  apixaban  5 mg Oral BID   carvedilol  12.5 mg Oral BID WC   DULoxetine  60 mg Oral Daily   pantoprazole  40 mg Oral Daily   Continuous Infusions:   ceFAZolin (ANCEF) IV 2 g (04/25/22 0600)   PRN Meds:.acetaminophen, aspirin EC, melatonin, montelukast, polyethylene glycol, prochlorperazine    Subjective:   Loa Socks was seen and examined today.  No new complaints.   Objective:   Vitals:   04/25/22 0503 04/25/22 0704 04/25/22 0705 04/25/22 1107  BP: (!) 154/100  (!) 169/102 131/76  Pulse: (!) 104  99 84  Resp: 16  (!) 25 (!) 22  Temp: 98.2 F (36.8 C)  97.7 F (36.5 C) 97.9 F (36.6 C)  TempSrc: Oral  Oral Oral  SpO2: 99%  96% 94%  Weight:   (!) 158.8 kg   Height:  '6\' 4"'$  (1.93 m) '6\' 4"'$  (1.93 m)     Intake/Output Summary (Last 24 hours) at 04/25/2022 1124 Last data filed at 04/25/2022 0650 Gross per 24 hour  Intake --  Output 5300 ml  Net -5300 ml   Filed Weights   04/24/22 1606 04/25/22 0705  Weight: (!) 147 kg (!) 158.8 kg     Exam General exam: Ill appearing gentleman, not in distress.  Respiratory system: diminished air entry at bases, scattered wheezing.  Cardiovascular system: S1 & S2 heard, RRR. No pedal edema. Gastrointestinal system: Abdomen is nondistended, soft and nontender.  Central nervous system: Alert and oriented. No focal neurological deficits. Extremities: Symmetric 5 x 5 power. Skin: chronic left lower extremity wound.  Psychiatry: Mood & affect appropriate.     Data Reviewed:  I have personally reviewed following labs and imaging studies   CBC Lab Results  Component Value Date   WBC 9.6 04/25/2022   RBC 3.58 (L) 04/25/2022   HGB 10.4 (L) 04/25/2022   HCT 32.7 (L) 04/25/2022   MCV 91.3 04/25/2022    MCH 29.1 04/25/2022   PLT 296 04/25/2022   MCHC 31.8 04/25/2022   RDW 15.0 04/25/2022   LYMPHSABS 0.6 (L) 04/24/2022   MONOABS 1.0 04/24/2022   EOSABS 0.3 04/24/2022   BASOSABS 0.0 84/66/5993     Last metabolic panel Lab Results  Component Value Date   NA 140 04/25/2022   K 3.9 04/25/2022   CL 110 04/25/2022   CO2 20 (  L) 04/25/2022   BUN 96 (H) 04/25/2022   CREATININE 4.68 (H) 04/25/2022   GLUCOSE 179 (H) 04/25/2022   GFRNONAA 13 (L) 04/25/2022   GFRAA 53 (L) 06/27/2019   CALCIUM 8.4 (L) 04/25/2022   PHOS 4.8 (H) 04/25/2022   PROT 6.7 04/24/2022   ALBUMIN 2.8 (L) 04/25/2022   BILITOT 0.6 04/24/2022   ALKPHOS 79 04/24/2022   AST 27 04/24/2022   ALT 32 04/24/2022   ANIONGAP 10 04/25/2022    CBG (last 3)  No results for input(s): "GLUCAP" in the last 72 hours.    Coagulation Profile: No results for input(s): "INR", "PROTIME" in the last 168 hours.   Radiology Studies: ECHOCARDIOGRAM COMPLETE  Result Date: 04/25/2022    ECHOCARDIOGRAM REPORT   Patient Name:   REBEL WILLCUTT Date of Exam: 04/25/2022 Medical Rec #:  [id number]         Height:       76.0 in Accession #:    [id number]        Weight:       350.1 lb Date of Birth:  1955-09-28        BSA:          2.810 m Patient Age:    19 years          BP:           169/102 mmHg Patient Gender: M                 HR:           90 bpm. Exam Location:  Inpatient Procedure: 2D Echo, Color Doppler and Cardiac Doppler Indications:    CHF- Acute Diastolic  History:        Patient has prior history of Echocardiogram examinations, most                 recent 04/02/2021. Arrythmias:Atrial Fibrillation; Risk                 Factors:Hypertension and Dyslipidemia.  Referring Phys: [id number]  1. Left ventricular ejection fraction, by estimation, is 60 to 65%. The left ventricle has normal function. The left ventricle has no regional wall motion abnormalities. There is mild left ventricular hypertrophy. Left  ventricular diastolic parameters are indeterminate.  2. Right ventricular systolic function is normal. The right ventricular size is normal.  3. Left atrial size was mildly dilated.  4. Mild mitral valve regurgitation.  5. The aortic valve is normal in structure. Aortic valve regurgitation is not visualized. Comparison(s): The left ventricular function is unchanged compared to echo from September 2022.Marland Kitchen FINDINGS  Left Ventricle: Left ventricular ejection fraction, by estimation, is 60 to 65%. The left ventricle has normal function. The left ventricle has no regional wall motion abnormalities. The left ventricular internal cavity size was normal in size. There is  mild left ventricular hypertrophy. Left ventricular diastolic parameters are indeterminate. Right Ventricle: The right ventricular size is normal. Right vetricular wall thickness was not assessed. Right ventricular systolic function is normal. Left Atrium: Left atrial size was mildly dilated. Right Atrium: Right atrial size was normal in size. Pericardium: Trivial pericardial effusion is present. Mitral Valve: There is mild thickening of the mitral valve leaflet(s). Mild mitral valve regurgitation. Tricuspid Valve: The tricuspid valve is normal in structure. Tricuspid valve regurgitation is trivial. Aortic Valve: The aortic valve is normal in structure. Aortic valve regurgitation is not visualized. Aortic valve mean gradient measures 4.0 mmHg. Aortic valve peak  gradient measures 7.3 mmHg. Aortic valve area, by VTI measures 2.93 cm. Pulmonic Valve: The pulmonic valve was normal in structure. Pulmonic valve regurgitation is not visualized. Aorta: The aortic root and ascending aorta are structurally normal, with no evidence of dilitation. IAS/Shunts: The interatrial septum was not assessed.  LEFT VENTRICLE PLAX 2D LVIDd:         5.40 cm LVIDs:         3.70 cm LV PW:         1.20 cm LV IVS:        1.30 cm LVOT diam:     2.30 cm LV SV:         72 LV SV Index:    26 LVOT Area:     4.15 cm  RIGHT VENTRICLE TAPSE (M-mode): 1.8 cm LEFT ATRIUM              Index        RIGHT ATRIUM           Index LA diam:        6.00 cm  2.13 cm/m   RA Area:     18.60 cm LA Vol (A2C):   124.5 ml 44.30 ml/m  RA Volume:   45.70 ml  16.26 ml/m LA Vol (A4C):   109.0 ml 38.79 ml/m LA Biplane Vol: 106.0 ml 37.72 ml/m  AORTIC VALVE AV Area (Vmax):    3.23 cm AV Area (Vmean):   3.03 cm AV Area (VTI):     2.93 cm AV Vmax:           135.00 cm/s AV Vmean:          95.600 cm/s AV VTI:            0.247 m AV Peak Grad:      7.3 mmHg AV Mean Grad:      4.0 mmHg LVOT Vmax:         105.00 cm/s LVOT Vmean:        69.700 cm/s LVOT VTI:          0.174 m LVOT/AV VTI ratio: 0.70  AORTA Ao Root diam: 3.40 cm Ao Asc diam:  3.80 cm MR Peak grad: 76.2 mmHg MR Vmax:      436.50 cm/s SHUNTS                           Systemic VTI:  0.17 m                           Systemic Diam: 2.30 cm Dorris Carnes MD Electronically signed by Dorris Carnes MD Signature Date/Time: 04/25/2022/10:56:14 AM    Final    CT Renal Stone Study  Result Date: 04/24/2022 CLINICAL DATA:  Decreased urine output.  Acute kidney injury. EXAM: CT ABDOMEN AND PELVIS WITHOUT CONTRAST TECHNIQUE: Multidetector CT imaging of the abdomen and pelvis was performed following the standard protocol without IV contrast. RADIATION DOSE REDUCTION: This exam was performed according to the departmental dose-optimization program which includes automated exposure control, adjustment of the mA and/or kV according to patient size and/or use of iterative reconstruction technique. COMPARISON:  01/13/2013 FINDINGS: Lower chest: No acute findings. Hepatobiliary: No mass visualized on this unenhanced exam. Gallbladder is unremarkable. No evidence of biliary ductal dilatation. Pancreas: No mass or inflammatory process visualized on this unenhanced exam. Spleen:  Within normal limits in size. Adrenals/Urinary tract: Simple appearing right renal  cyst is again seen measuring  approximately 9 cm (no followup imaging is recommended). Severe bilateral hydroureteronephrosis is seen to the level of the urinary bladder which is new since previous study. No evidence of renal or ureteral calculi. The urinary bladder is also markedly distended, and this raises suspicion for vesicoureteral reflux and urinary retention. Stomach/Bowel: No evidence of obstruction, inflammatory process, or abnormal fluid collections. Vascular/Lymphatic: No pathologically enlarged lymph nodes identified. No evidence of abdominal aortic aneurysm. Reproductive: Moderately enlarged prostate gland again seen, with mass effect on bladder base. Other:  None. Musculoskeletal:  No suspicious bone lesions identified. IMPRESSION: Severe bilateral hydroureteronephrosis to the level of the urinary bladder, new since previous study. No evidence of ureteral calculi or other obstructing etiology. Markedly distended urinary bladder is suggestive vesicoureteral reflux as the etiology. Recommend clinical correlation for acute urinary retention. Moderately enlarged prostate gland. Electronically Signed   By: Marlaine Hind M.D.   On: 04/24/2022 18:52   DG Tibia/Fibula Left  Result Date: 04/24/2022 CLINICAL DATA:  Left leg cellulitis. Shortness of breath for 1 week. EXAM: LEFT FOOT - COMPLETE 3+ VIEW; LEFT TIBIA AND FIBULA - 2 VIEW COMPARISON:  Left ankle radiographs 10/17/2008 FINDINGS: Left tibia and fibula: Mild medial compartment of the knee joint space narrowing. Old healed fracture of the proximal fibular diaphysis with moderate chronic healing callus formation and mild anterior displacement of the distal component. Mild patellofemoral joint space narrowing. Moderate anterior and posterior distal tibial plafond and dorsal talar dome-talar neck degenerative spurring. Redemonstration of moderate chronic corticated ossicles adjacent to the distal medial malleolus. Mild distal fibula and adjacent lateral talar process degenerative  spurring. Left foot: Mild great toe metatarsophalangeal and diffuse first through fifth interphalangeal joint space narrowing and peripheral osteophytosis. Mild likely chronic spurring at the lateral base of the proximal phalanx of the great toe. No acute fracture or dislocation. Moderate diffuse left calf and foot soft tissue swelling. No cortical erosion is seen. IMPRESSION: 1. Moderate diffuse left calf and foot soft tissue swelling. No cortical erosion is seen to indicate radiographic evidence of acute osteomyelitis. 2. Mild-to-moderate left ankle osteoarthritis. 3. Mild left great toe metatarsophalangeal and interphalangeal joint osteoarthritis. Electronically Signed   By: Yvonne Kendall M.D.   On: 04/24/2022 17:45   DG Foot Complete Left  Result Date: 04/24/2022 CLINICAL DATA:  Left leg cellulitis. Shortness of breath for 1 week. EXAM: LEFT FOOT - COMPLETE 3+ VIEW; LEFT TIBIA AND FIBULA - 2 VIEW COMPARISON:  Left ankle radiographs 10/17/2008 FINDINGS: Left tibia and fibula: Mild medial compartment of the knee joint space narrowing. Old healed fracture of the proximal fibular diaphysis with moderate chronic healing callus formation and mild anterior displacement of the distal component. Mild patellofemoral joint space narrowing. Moderate anterior and posterior distal tibial plafond and dorsal talar dome-talar neck degenerative spurring. Redemonstration of moderate chronic corticated ossicles adjacent to the distal medial malleolus. Mild distal fibula and adjacent lateral talar process degenerative spurring. Left foot: Mild great toe metatarsophalangeal and diffuse first through fifth interphalangeal joint space narrowing and peripheral osteophytosis. Mild likely chronic spurring at the lateral base of the proximal phalanx of the great toe. No acute fracture or dislocation. Moderate diffuse left calf and foot soft tissue swelling. No cortical erosion is seen. IMPRESSION: 1. Moderate diffuse left calf and  foot soft tissue swelling. No cortical erosion is seen to indicate radiographic evidence of acute osteomyelitis. 2. Mild-to-moderate left ankle osteoarthritis. 3. Mild left great toe metatarsophalangeal and interphalangeal joint osteoarthritis. Electronically Signed  By: Yvonne Kendall M.D.   On: 04/24/2022 17:45   DG Chest 2 View  Result Date: 04/24/2022 CLINICAL DATA:  Shortness of breath. EXAM: CHEST - 2 VIEW COMPARISON:  Chest radiograph dated 04/17/2021. FINDINGS: Mild eventration of the right hemidiaphragm. There is mild cardiomegaly with mild vascular congestion. No focal consolidation, pleural effusion or pneumothorax. Degenerative changes of the spine. No acute osseous pathology. IMPRESSION: Mild cardiomegaly with mild vascular congestion. Electronically Signed   By: Anner Crete M.D.   On: 04/24/2022 17:43       Hosie Poisson M.D. [facility] 04/25/2022, 11:24 AM  Available via Epic secure chat 7am-7pm After 7 pm, please refer to night coverage provider listed on amion.

## 2022-04-25 NOTE — ED Notes (Addendum)
Pt. Called for assistance, checked on pt. And IV saline lock came out due to pt. Rolling over in the bed.pt. cleaned up and new gauze applied.

## 2022-04-25 NOTE — Progress Notes (Signed)
Patient came up to floor, room 1439, at 0700. Vitals were taken. Patient was placed on cardiac monitoring with 2nd verifier. Patient's height and weight was documented. Off-going RN gave report on patient to on-coming RN about patient just recently coming to the floor.

## 2022-04-25 NOTE — ED Notes (Signed)
Patient upset about wanting something to eat threatened to pull out foley cath. This nurse explained that we can get him something to eat. Given 16oz drink and sandwhich. Refusing to proceed with care until he is able to eat.

## 2022-04-25 NOTE — TOC Initial Note (Signed)
Transition of Care [facility]) - Initial/Assessment Note    Patient Details  Name: [name] MRN: [id number] Date of Birth: October 14, 1955  Transition of [facility] CM/SW Contact:    Roseanne Kaufman, RN Phone Number: 04/25/2022, 4:30 PM  Clinical Narrative:   No TOC consult on file, spoke with patient who request a call to his wife Helene Kelp who is a better historian than he is. This RNCM left voicemail message to patient's wife Helene Kelp. No current TOC needs at this time.   TOC will continue to follow for dc needs.                  Barriers to Discharge: Continued Medical Work up   Patient Goals and CMS Choice     Choice offered to / list presented to : NA  Expected Discharge Plan and Services                                                Prior Living Arrangements/Services     Patient language and need for interpreter reviewed:: Yes Do you feel safe going back to the place where you live?: Yes      Need for Family Participation in Patient Care: No (Comment) Care giver support system in place?: Yes (comment)   Criminal Activity/Legal Involvement Pertinent to Current Situation/Hospitalization: No - Comment as needed  Activities of Daily Living Home Assistive Devices/Equipment: None ADL Screening (condition at time of admission) Patient's cognitive ability adequate to safely complete daily activities?: Yes Is the patient deaf or have difficulty hearing?: Yes Does the patient have difficulty seeing, even when wearing glasses/contacts?: No Does the patient have difficulty concentrating, remembering, or making decisions?: No Patient able to express need for assistance with ADLs?: Yes Does the patient have difficulty dressing or bathing?: No Independently performs ADLs?: Yes (appropriate for developmental age) Does the patient have difficulty walking or climbing stairs?: No Weakness of Legs: None Weakness of Arms/Hands: None  Permission  Sought/Granted Permission sought to share information with : Case Manager Permission granted to share information with : Yes, Verbal Permission Granted  Share Information with NAME: Case manager           Emotional Assessment Appearance:: Appears stated age Attitude/Demeanor/Rapport: Reactive Affect (typically observed): Accepting Orientation: : Oriented to Place, Oriented to Self, Oriented to  Time Alcohol / Substance Use: Not Applicable Psych Involvement: No (comment)  Admission diagnosis:  Urinary retention [R33.9] Volume overload [E87.70] Left leg cellulitis [J49.702] Patient Active Problem List   Diagnosis Date Noted   Volume overload 04/24/2022   Acute on chronic diastolic CHF (congestive heart failure) ([place]) 04/17/2021   SOB (shortness of breath) 04/17/2021   Sepsis ([place]) 04/17/2021   Acute lower UTI 63/78/5885   Acute systolic CHF (congestive heart failure) ([place]) 04/16/2021   Abscess of forearm, left    Cellulitis of forearm    Abscess of left forearm 04/24/2020   Restless leg syndrome 01/16/2020   Sleep apnea 01/16/2020   Persistent atrial fibrillation ([place]) 05/02/2019   Acquired thrombophilia ([place]) 05/02/2019   Jerking movements of extremities 03/24/2019   Morbid obesity ([place]) 03/03/2019   Syncope 11/15/2015   Encounter for therapeutic drug monitoring 08/02/2013   Long term current use of anticoagulant 09/18/2010   Mitral regurgitation 03/14/2009   Dilated cardiomyopathy ([place]) 03/14/2009   Anxiety with depression 02/13/2009  ALLERGIC RHINITIS 02/13/2009   Hyperlipidemia 02/12/2009   Essential hypertension 02/12/2009   PCP:  [name], MD Pharmacy:   [facility] [address] [place] [address] Phone: [phone] Fax: [phone]     Social Determinants of Health (SDOH) Interventions    Readmission Risk Interventions     No data to  display

## 2022-04-25 NOTE — Consult Note (Signed)
WOC Nurse Consult Note: Patient receiving care in [facility]. No photo in chart for LLE wound(s) Reason for Consult: "leg wound" Wound type: Per Dr. Dellia Nims in the [place] on 04/24/22, it is primary venous leg ulcer with comorbidities of lymphedema and congestive heart failure. Pressure Injury POA: Yes/No/NA Measurement: Wound bed: Drainage (amount, consistency, odor) serous, amber Periwound: Dressing procedure/placement/frequency: Cleanse LLE wound area(s) with saline, pat dry. Place Aquacel Advantage Kellie Simmering [phone]) then ABD pad, secure with kerlix. Change daily and if soiled.  Monitor the wound area(s) for worsening of condition such as: Signs/symptoms of infection,  Increase in size,  Development of or worsening of odor, Development of pain, or increased pain at the affected locations.  Notify the medical team if any of these develop.  Thank you for the consult.  [place] nurse will not follow at this time.  Please re-consult the [place] team if needed.  Val Riles, RN, MSN, CWOCN, CNS-BC, pager [phone]

## 2022-04-26 DIAGNOSIS — E8770 Fluid overload, unspecified: Secondary | ICD-10-CM | POA: Diagnosis not present

## 2022-04-26 DIAGNOSIS — L03116 Cellulitis of left lower limb: Secondary | ICD-10-CM | POA: Diagnosis not present

## 2022-04-26 DIAGNOSIS — R339 Retention of urine, unspecified: Secondary | ICD-10-CM | POA: Diagnosis not present

## 2022-04-26 LAB — BASIC METABOLIC PANEL
Anion gap: 8 (ref 5–15)
BUN: 74 mg/dL — ABNORMAL HIGH (ref 8–23)
CO2: 21 mmol/L — ABNORMAL LOW (ref 22–32)
Calcium: 8.5 mg/dL — ABNORMAL LOW (ref 8.9–10.3)
Chloride: 112 mmol/L — ABNORMAL HIGH (ref 98–111)
Creatinine, Ser: 3.9 mg/dL — ABNORMAL HIGH (ref 0.61–1.24)
GFR, Estimated: 16 mL/min — ABNORMAL LOW (ref >60)
Glucose, Bld: 129 mg/dL — ABNORMAL HIGH (ref 70–99)
Potassium: 4.5 mmol/L (ref 3.5–5.1)
Sodium: 141 mmol/L (ref 135–145)

## 2022-04-26 MED ORDER — CHLORHEXIDINE GLUCONATE CLOTH 2 % EX PADS
6.0000 | MEDICATED_PAD | Freq: Every day | CUTANEOUS | Status: DC
  Administered 2022-04-26 – 2022-04-28 (×3): 6 via TOPICAL

## 2022-04-26 MED ORDER — ALBUTEROL SULFATE (2.5 MG/3ML) 0.083% IN NEBU: 3.0000 mL | INHALATION_SOLUTION | RESPIRATORY_TRACT | Status: DC | PRN

## 2022-04-26 MED ORDER — GABAPENTIN 100 MG PO CAPS
100.0000 mg | ORAL_CAPSULE | Freq: Three times a day (TID) | ORAL | Status: DC
  Administered 2022-04-26 – 2022-04-28 (×6): 100 mg via ORAL
  Filled 2022-04-26 (×6): qty 1

## 2022-04-26 MED ORDER — FINASTERIDE 5 MG PO TABS
5.0000 mg | ORAL_TABLET | Freq: Every day | ORAL | Status: DC
  Administered 2022-04-26 – 2022-04-28 (×3): 5 mg via ORAL
  Filled 2022-04-26 (×3): qty 1

## 2022-04-26 MED ORDER — ALBUTEROL SULFATE HFA 108 (90 BASE) MCG/ACT IN AERS: 2.0000 | INHALATION_SPRAY | RESPIRATORY_TRACT | Status: DC | PRN

## 2022-04-26 MED ORDER — ALBUTEROL SULFATE (2.5 MG/3ML) 0.083% IN NEBU
2.5000 mg | INHALATION_SOLUTION | RESPIRATORY_TRACT | Status: DC | PRN
  Administered 2022-04-26 – 2022-04-27 (×2): 2.5 mg via RESPIRATORY_TRACT
  Filled 2022-04-26 (×2): qty 3

## 2022-04-26 MED ORDER — IPRATROPIUM-ALBUTEROL 0.5-2.5 (3) MG/3ML IN SOLN: 3.0000 mL | RESPIRATORY_TRACT | Status: DC | PRN

## 2022-04-26 NOTE — Evaluation (Signed)
Physical Therapy Evaluation Patient Details Name: [name] MRN: [id number] DOB: [birth year] Today's Date: 04/26/2022  History of Present Illness  66 y.o. male with medical history significant for severe morbid obesity, chronic diastolic CHF, chronic left lower extremity wound followed by wound care clinic, permanent atrial fibrillation on Eliquis, moderate to severe mitral regurgitation and mild tricuspid regurgitation, hypertension, polyneuropathy, restless leg syndrome, chronic anxiety/depression, who presented to [facility] ED from home due to progressively worsening shortness of breath for the past week, associated with worsening bilateral lower extremity edema.   In the ED, patient noted to be significantly volume overload with acute urinary retention and AKI.  Also noted to have left lower extremity cellulitis  Clinical Impression  Pt ambulated 106' without an assistive device, no loss of balance, SaO2 97% on room air. He is mobilizing well independently. No further PT indicated, will sign off.        Recommendations for follow up therapy are one component of a multi-disciplinary discharge planning process, led by the attending physician.  Recommendations may be updated based on patient status, additional functional criteria and insurance authorization.  Follow Up Recommendations No PT follow up      Assistance Recommended at Discharge None  Patient can return home with the following       Equipment Recommendations None recommended by PT  Recommendations for Other Services       Functional Status Assessment Patient has not had a recent decline in their functional status     Precautions / Restrictions Precautions Precautions: None Restrictions Weight Bearing Restrictions: No      Mobility  Bed Mobility               General bed mobility comments: sitting up at edge of bed    Transfers Overall transfer level: Independent                       Ambulation/Gait Ambulation/Gait assistance: Independent Gait Distance (Feet): 80 Feet Assistive device: None Gait Pattern/deviations: WFL(Within Functional Limits) Gait velocity: WNL     General Gait Details: steady, no loss of balance; SaO2 97% on room air, 2/4 dyspnea  Stairs            Wheelchair Mobility    Modified Rankin (Stroke Patients Only)       Balance Overall balance assessment: Independent                                           Pertinent Vitals/Pain Pain Assessment Pain Assessment: No/denies pain    Home Living Family/patient expects to be discharged to:: Private residence Living Arrangements: Spouse/significant other Available Help at Discharge: Family;Available 24 hours/day   Home Access: Stairs to enter   Entrance Stairs-Number of Steps: flight Alternate Level Stairs-Number of Steps: flight   Home Equipment: None      Prior Function Prior Level of Function : Independent/Modified Independent                     Hand Dominance        Extremity/Trunk Assessment   Upper Extremity Assessment Upper Extremity Assessment: Overall WFL for tasks assessed    Lower Extremity Assessment Lower Extremity Assessment: Overall WFL for tasks assessed    Cervical / Trunk Assessment Cervical / Trunk Assessment: Normal  Communication   Communication: No difficulties  Cognition Arousal/Alertness: Awake/alert  Behavior During Therapy: WFL for tasks assessed/performed Overall Cognitive Status: Within Functional Limits for tasks assessed                                          General Comments      Exercises     Assessment/Plan    PT Assessment Patient does not need any further PT services  PT Problem List         PT Treatment Interventions      PT Goals (Current goals can be found in the Care Plan section)  Acute Rehab PT Goals PT Goal Formulation: All assessment and education complete,  DC therapy    Frequency       Co-evaluation               AM-PAC PT "6 Clicks" Mobility  Outcome Measure Help needed turning from your back to your side while in a flat bed without using bedrails?: None Help needed moving from lying on your back to sitting on the side of a flat bed without using bedrails?: None Help needed moving to and from a bed to a chair (including a wheelchair)?: None Help needed standing up from a chair using your arms (e.g., wheelchair or bedside chair)?: None Help needed to walk in hospital room?: None Help needed climbing 3-5 steps with a railing? : None 6 Click Score: 24    End of Session   Activity Tolerance: Patient tolerated treatment well Patient left: in bed;with call bell/phone within reach;with nursing/sitter in room Nurse Communication: Mobility status      Time: 1131-1141 PT Time Calculation (min) (ACUTE ONLY): 10 min   Charges:   PT Evaluation $PT Eval Low Complexity: 1 Low          Philomena Doheny PT 04/26/2022  Acute Rehabilitation Services  Office [phone]

## 2022-04-26 NOTE — Progress Notes (Signed)
Notified on call provider that patient keeps taking the cardiac monitor off. Also, notified on call provider that patient was wheezing but did not have anything PRN. On call provider put in new orders.

## 2022-04-26 NOTE — Progress Notes (Signed)
[facility]                                                                               [name], is a 66 y.o. male, DOB - August 31, 1955, TGG:[id number] Admit date - 04/24/2022    Outpatient Primary MD for the patient is Clinton Dus, MD  LOS - 2  days    Brief summary   [name] is a 66 y.o. male with medical history significant for severe morbid obesity, chronic diastolic CHF, chronic left lower extremity wound followed by wound care clinic, permanent atrial fibrillation on Eliquis, moderate to severe mitral regurgitation and mild tricuspid regurgitation, hypertension, polyneuropathy, restless leg syndrome, chronic anxiety/depression, who presented to [facility] ED from home due to progressively worsening shortness of breath for the past week, associated with worsening bilateral lower extremity edema.  In the ED, patient noted to be significantly volume overload with acute urinary retention and AKI.  Also noted to have left lower extremity cellulitis for which he was started on cefazolin.  EDP discussed the case with nephrology who recommended Foley catheter insertion for now and hold off IV diuresing.  A Foley catheter was inserted with 3L urine output.  The patient was admitted by [facility], hospitalist service.   Assessment & Plan    Assessment and Plan:  Volume overload secondary to acute on stage 3 b CKD due to Urinary Retention from enlarged prostate.  Foley catheter was placed and 3 lit came out. So far he has 6 lit out.  Nephrology consulted by EDP recommended, monitoring renal parameters and if no improvement to call nephrology consult .  CT renal study shows Severe bilateral hydroureteronephrosis to the level of the urinary bladder, new since previous study. No evidence of ureteral calculi or other obstructing etiology. Markedly distended urinary bladder is suggestive vesicoureteral reflux as the etiology. Creatinine not back to baseline yet.  Hold diuretics  for now as per nephrology.  Potassium is wnl.  Bicarb is 20 today.  Creatinine improved slightly from 5.5 to 4.68 to 3.9 Hold home torsemide and hydrochlorothiazide.  UA shows small leukocytes, no bacteria and wbc wnl, negative nitrites.  Discussed with Urology, plan for outpatient follow up. Recommended adding finasteride to the flomax.     Body mass index is 43.42 kg/m. Severe Morbid Obesity.  Recommend outpatient follow up with PCP for weight loss .    Chronic left lower extremity wound:  Wound care consult for recommendations.  X rays show Moderate diffuse left calf and foot soft tissue swelling. No cortical erosion is seen to indicate radiographic evidence of acute osteomyelitis. Pt empirically started on cefazolin.  ABI's ordered.  Blood cultures are negative so far.      Chronic diastolic heart failure:  Though bnp is slightly elevated.  Pt denies any sob this morning but is on 2 lit of [place] oxygen.  Echocardiogram today shows no change compared to the one from 03/2021.    Hypertension:  BP parameters are well controlled.    Permanent atrial fibrillation with RVR  Rate control with cardizem and on eliquis for anti coagulation.    Polyneuropathy:  Pt is on gabapentin 300 mg TID.  Due to AKI, will decrease the dose to 100 mg TID .     Restless leg syndrome:  Resume requip.     Chronic anxiety and depression:  Resume home meds .    Estimated body mass index is 43.42 kg/m as calculated from the following:   Height as of this encounter: '6\' 4"'$  (1.93 m).   Weight as of this encounter: 161.8 kg.  Code Status: full code.  DVT Prophylaxis:   apixaban (ELIQUIS) tablet 5 mg   Level of Care: Level of care: Progressive Family Communication: none at bedside,   Disposition Plan:     Remains inpatient appropriate:  waiting for renal function to improve.   Procedures:  CT renal study.  Echocardiogram.   Consultants:   None.   Antimicrobials:    Anti-infectives (From admission, onward)    Start     Dose/Rate Route Frequency Ordered Stop   04/25/22 0600  ceFAZolin (ANCEF) IVPB 2g/100 mL premix        2 g 200 mL/hr over 30 Minutes Intravenous Every 12 hours 04/24/22 2055     04/24/22 1730  ceFAZolin (ANCEF) IVPB 2g/100 mL premix        2 g 200 mL/hr over 30 Minutes Intravenous  Once 04/24/22 1708 04/24/22 2133        Medications  Scheduled Meds:  apixaban  5 mg Oral BID   carvedilol  12.5 mg Oral BID WC   Chlorhexidine Gluconate Cloth  6 each Topical Daily   diltiazem  360 mg Oral Daily   DULoxetine  60 mg Oral Daily   pantoprazole  40 mg Oral Daily   rOPINIRole  1 mg Oral Q lunch   rOPINIRole  2 mg Oral QHS   tamsulosin  0.4 mg Oral QPC supper   Continuous Infusions:   ceFAZolin (ANCEF) IV Stopped (04/26/22 0708)   PRN Meds:.acetaminophen, albuterol, aspirin EC, ipratropium-albuterol, LORazepam, melatonin, montelukast, polyethylene glycol, prochlorperazine, tiZANidine    Subjective:   [place] was seen and examined today.  Wants to go home.   Objective:   Vitals:   04/26/22 0256 04/26/22 0441 04/26/22 0655 04/26/22 1333  BP:  (!) 169/87 (!) 148/97 (!) 140/81  Pulse:  82  86  Resp:  18  (!) 22  Temp:  97.8 F (36.6 C)  98.3 F (36.8 C)  TempSrc:  Other (Comment)  Oral  SpO2: 97% 98%  94%  Weight:  (!) 161.8 kg    Height:        Intake/Output Summary (Last 24 hours) at 04/26/2022 1633 Last data filed at 04/26/2022 1505 Gross per 24 hour  Intake 1897 ml  Output 1750 ml  Net 147 ml    Filed Weights   04/24/22 1606 04/25/22 0705 04/26/22 0441  Weight: (!) 147 kg (!) 158.8 kg (!) 161.8 kg     Exam  General exam: Iwell developed gentleman,  Respiratory system: Clear to auscultation. Respiratory effort normal. Cardiovascular system: S1 & S2 heard, RRR. No JVD,  Gastrointestinal system: Abdomen is nondistended, soft and nontender. Normal bowel sounds heard. Central nervous system:  Alert and oriented. No focal neurological deficits. Extremities: Symmetric 5 x 5 power. Skin: chronic lower extremity ulcer.  Psychiatry: Mood & affect appropriate.      Data Reviewed:  I have personally reviewed following labs and imaging studies   CBC Lab Results  Component Value Date   WBC 9.6 04/25/2022   RBC 3.58 (L) 04/25/2022   HGB 10.4 (L) 04/25/2022  HCT 32.7 (L) 04/25/2022   MCV 91.3 04/25/2022   MCH 29.1 04/25/2022   PLT 296 04/25/2022   MCHC 31.8 04/25/2022   RDW 15.0 04/25/2022   LYMPHSABS 0.6 (L) 04/24/2022   MONOABS 1.0 04/24/2022   EOSABS 0.3 04/24/2022   BASOSABS 0.0 29/79/8921     Last metabolic panel Lab Results  Component Value Date   NA 141 04/26/2022   K 4.5 04/26/2022   CL 112 (H) 04/26/2022   CO2 21 (L) 04/26/2022   BUN 74 (H) 04/26/2022   CREATININE 3.90 (H) 04/26/2022   GLUCOSE 129 (H) 04/26/2022   GFRNONAA 16 (L) 04/26/2022   GFRAA 53 (L) 06/27/2019   CALCIUM 8.5 (L) 04/26/2022   PHOS 4.8 (H) 04/25/2022   PROT 6.7 04/24/2022   ALBUMIN 2.8 (L) 04/25/2022   BILITOT 0.6 04/24/2022   ALKPHOS 79 04/24/2022   AST 27 04/24/2022   ALT 32 04/24/2022   ANIONGAP 8 04/26/2022    CBG (last 3)  No results for input(s): "GLUCAP" in the last 72 hours.    Coagulation Profile: No results for input(s): "INR", "PROTIME" in the last 168 hours.   Radiology Studies: ECHOCARDIOGRAM COMPLETE  Result Date: 04/25/2022    ECHOCARDIOGRAM REPORT   Patient Name:   YOSIAH JASMIN Date of Exam: 04/25/2022 Medical Rec #:  [id number]         Height:       76.0 in Accession #:    [id number]        Weight:       350.1 lb Date of Birth:  1955/10/01        BSA:          2.810 m Patient Age:    32 years          BP:           169/102 mmHg Patient Gender: M                 HR:           90 bpm. Exam Location:  Inpatient Procedure: 2D Echo, Color Doppler and Cardiac Doppler Indications:    CHF- Acute Diastolic  History:        Patient has prior history of  Echocardiogram examinations, most                 recent 04/02/2021. Arrythmias:Atrial Fibrillation; Risk                 Factors:Hypertension and Dyslipidemia.  Referring Phys: [id number] Tok  1. Left ventricular ejection fraction, by estimation, is 60 to 65%. The left ventricle has normal function. The left ventricle has no regional wall motion abnormalities. There is mild left ventricular hypertrophy. Left ventricular diastolic parameters are indeterminate.  2. Right ventricular systolic function is normal. The right ventricular size is normal.  3. Left atrial size was mildly dilated.  4. Mild mitral valve regurgitation.  5. The aortic valve is normal in structure. Aortic valve regurgitation is not visualized. Comparison(s): The left ventricular function is unchanged compared to echo from September 2022.Marland Kitchen FINDINGS  Left Ventricle: Left ventricular ejection fraction, by estimation, is 60 to 65%. The left ventricle has normal function. The left ventricle has no regional wall motion abnormalities. The left ventricular internal cavity size was normal in size. There is  mild left ventricular hypertrophy. Left ventricular diastolic parameters are indeterminate. Right Ventricle: The right ventricular size is normal. Right vetricular wall thickness was not assessed.  Right ventricular systolic function is normal. Left Atrium: Left atrial size was mildly dilated. Right Atrium: Right atrial size was normal in size. Pericardium: Trivial pericardial effusion is present. Mitral Valve: There is mild thickening of the mitral valve leaflet(s). Mild mitral valve regurgitation. Tricuspid Valve: The tricuspid valve is normal in structure. Tricuspid valve regurgitation is trivial. Aortic Valve: The aortic valve is normal in structure. Aortic valve regurgitation is not visualized. Aortic valve mean gradient measures 4.0 mmHg. Aortic valve peak gradient measures 7.3 mmHg. Aortic valve area, by VTI measures 2.93 cm.  Pulmonic Valve: The pulmonic valve was normal in structure. Pulmonic valve regurgitation is not visualized. Aorta: The aortic root and ascending aorta are structurally normal, with no evidence of dilitation. IAS/Shunts: The interatrial septum was not assessed.  LEFT VENTRICLE PLAX 2D LVIDd:         5.40 cm LVIDs:         3.70 cm LV PW:         1.20 cm LV IVS:        1.30 cm LVOT diam:     2.30 cm LV SV:         72 LV SV Index:   26 LVOT Area:     4.15 cm  RIGHT VENTRICLE TAPSE (M-mode): 1.8 cm LEFT ATRIUM              Index        RIGHT ATRIUM           Index LA diam:        6.00 cm  2.13 cm/m   RA Area:     18.60 cm LA Vol (A2C):   124.5 ml 44.30 ml/m  RA Volume:   45.70 ml  16.26 ml/m LA Vol (A4C):   109.0 ml 38.79 ml/m LA Biplane Vol: 106.0 ml 37.72 ml/m  AORTIC VALVE AV Area (Vmax):    3.23 cm AV Area (Vmean):   3.03 cm AV Area (VTI):     2.93 cm AV Vmax:           135.00 cm/s AV Vmean:          95.600 cm/s AV VTI:            0.247 m AV Peak Grad:      7.3 mmHg AV Mean Grad:      4.0 mmHg LVOT Vmax:         105.00 cm/s LVOT Vmean:        69.700 cm/s LVOT VTI:          0.174 m LVOT/AV VTI ratio: 0.70  AORTA Ao Root diam: 3.40 cm Ao Asc diam:  3.80 cm MR Peak grad: 76.2 mmHg MR Vmax:      436.50 cm/s SHUNTS                           Systemic VTI:  0.17 m                           Systemic Diam: 2.30 cm Dorris Carnes MD Electronically signed by Dorris Carnes MD Signature Date/Time: 04/25/2022/10:56:14 AM    Final    CT Renal Stone Study  Result Date: 04/24/2022 CLINICAL DATA:  Decreased urine output.  Acute kidney injury. EXAM: CT ABDOMEN AND PELVIS WITHOUT CONTRAST TECHNIQUE: Multidetector CT imaging of the abdomen and pelvis was performed following the standard protocol without IV contrast. RADIATION  DOSE REDUCTION: This exam was performed according to the departmental dose-optimization program which includes automated exposure control, adjustment of the mA and/or kV according to patient size and/or use  of iterative reconstruction technique. COMPARISON:  01/13/2013 FINDINGS: Lower chest: No acute findings. Hepatobiliary: No mass visualized on this unenhanced exam. Gallbladder is unremarkable. No evidence of biliary ductal dilatation. Pancreas: No mass or inflammatory process visualized on this unenhanced exam. Spleen:  Within normal limits in size. Adrenals/Urinary tract: Simple appearing right renal cyst is again seen measuring approximately 9 cm (no followup imaging is recommended). Severe bilateral hydroureteronephrosis is seen to the level of the urinary bladder which is new since previous study. No evidence of renal or ureteral calculi. The urinary bladder is also markedly distended, and this raises suspicion for vesicoureteral reflux and urinary retention. Stomach/Bowel: No evidence of obstruction, inflammatory process, or abnormal fluid collections. Vascular/Lymphatic: No pathologically enlarged lymph nodes identified. No evidence of abdominal aortic aneurysm. Reproductive: Moderately enlarged prostate gland again seen, with mass effect on bladder base. Other:  None. Musculoskeletal:  No suspicious bone lesions identified. IMPRESSION: Severe bilateral hydroureteronephrosis to the level of the urinary bladder, new since previous study. No evidence of ureteral calculi or other obstructing etiology. Markedly distended urinary bladder is suggestive vesicoureteral reflux as the etiology. Recommend clinical correlation for acute urinary retention. Moderately enlarged prostate gland. Electronically Signed   By: Marlaine Hind M.D.   On: 04/24/2022 18:52   DG Tibia/Fibula Left  Result Date: 04/24/2022 CLINICAL DATA:  Left leg cellulitis. Shortness of breath for 1 week. EXAM: LEFT FOOT - COMPLETE 3+ VIEW; LEFT TIBIA AND FIBULA - 2 VIEW COMPARISON:  Left ankle radiographs 10/17/2008 FINDINGS: Left tibia and fibula: Mild medial compartment of the knee joint space narrowing. Old healed fracture of the proximal  fibular diaphysis with moderate chronic healing callus formation and mild anterior displacement of the distal component. Mild patellofemoral joint space narrowing. Moderate anterior and posterior distal tibial plafond and dorsal talar dome-talar neck degenerative spurring. Redemonstration of moderate chronic corticated ossicles adjacent to the distal medial malleolus. Mild distal fibula and adjacent lateral talar process degenerative spurring. Left foot: Mild great toe metatarsophalangeal and diffuse first through fifth interphalangeal joint space narrowing and peripheral osteophytosis. Mild likely chronic spurring at the lateral base of the proximal phalanx of the great toe. No acute fracture or dislocation. Moderate diffuse left calf and foot soft tissue swelling. No cortical erosion is seen. IMPRESSION: 1. Moderate diffuse left calf and foot soft tissue swelling. No cortical erosion is seen to indicate radiographic evidence of acute osteomyelitis. 2. Mild-to-moderate left ankle osteoarthritis. 3. Mild left great toe metatarsophalangeal and interphalangeal joint osteoarthritis. Electronically Signed   By: Yvonne Kendall M.D.   On: 04/24/2022 17:45   DG Foot Complete Left  Result Date: 04/24/2022 CLINICAL DATA:  Left leg cellulitis. Shortness of breath for 1 week. EXAM: LEFT FOOT - COMPLETE 3+ VIEW; LEFT TIBIA AND FIBULA - 2 VIEW COMPARISON:  Left ankle radiographs 10/17/2008 FINDINGS: Left tibia and fibula: Mild medial compartment of the knee joint space narrowing. Old healed fracture of the proximal fibular diaphysis with moderate chronic healing callus formation and mild anterior displacement of the distal component. Mild patellofemoral joint space narrowing. Moderate anterior and posterior distal tibial plafond and dorsal talar dome-talar neck degenerative spurring. Redemonstration of moderate chronic corticated ossicles adjacent to the distal medial malleolus. Mild distal fibula and adjacent lateral talar  process degenerative spurring. Left foot: Mild great toe metatarsophalangeal and diffuse first through  fifth interphalangeal joint space narrowing and peripheral osteophytosis. Mild likely chronic spurring at the lateral base of the proximal phalanx of the great toe. No acute fracture or dislocation. Moderate diffuse left calf and foot soft tissue swelling. No cortical erosion is seen. IMPRESSION: 1. Moderate diffuse left calf and foot soft tissue swelling. No cortical erosion is seen to indicate radiographic evidence of acute osteomyelitis. 2. Mild-to-moderate left ankle osteoarthritis. 3. Mild left great toe metatarsophalangeal and interphalangeal joint osteoarthritis. Electronically Signed   By: Yvonne Kendall M.D.   On: 04/24/2022 17:45   DG Chest 2 View  Result Date: 04/24/2022 CLINICAL DATA:  Shortness of breath. EXAM: CHEST - 2 VIEW COMPARISON:  Chest radiograph dated 04/17/2021. FINDINGS: Mild eventration of the right hemidiaphragm. There is mild cardiomegaly with mild vascular congestion. No focal consolidation, pleural effusion or pneumothorax. Degenerative changes of the spine. No acute osseous pathology. IMPRESSION: Mild cardiomegaly with mild vascular congestion. Electronically Signed   By: Anner Crete M.D.   On: 04/24/2022 17:43       Hosie Poisson M.D. [facility] 04/26/2022, 4:33 PM  Available via Epic secure chat 7am-7pm After 7 pm, please refer to night coverage provider listed on amion.

## 2022-04-26 NOTE — Plan of Care (Signed)
  Problem: Education: Goal: Knowledge of General Education information will improve Description: Including pain rating scale, medication(s)/side effects and non-pharmacologic comfort measures Outcome: Progressing   Problem: Elimination: Goal: Will not experience complications related to bowel motility Outcome: Progressing Goal: Will not experience complications related to urinary retention Outcome: Progressing   Problem: Pain Managment: Goal: General experience of comfort will improve Outcome: Progressing   Problem: Safety: Goal: Ability to remain free from injury will improve Outcome: Progressing   Problem: Skin Integrity: Goal: Risk for impaired skin integrity will decrease Outcome: Progressing

## 2022-04-26 NOTE — Progress Notes (Signed)
OT Cancellation Note  Patient Details Name: [name] MRN: [id number] DOB: 01/21/1956   Cancelled Treatment:    Reason Eval/Treat Not Completed: OT screened, no needs identified, will sign off  Spoke with pt and RN. Pt states he is I with ADL activity. Will sign off  [place], Thereasa Parkin 04/26/2022, 3:27 PM

## 2022-04-27 DIAGNOSIS — L03116 Cellulitis of left lower limb: Secondary | ICD-10-CM | POA: Diagnosis not present

## 2022-04-27 DIAGNOSIS — R339 Retention of urine, unspecified: Secondary | ICD-10-CM | POA: Diagnosis not present

## 2022-04-27 DIAGNOSIS — E8770 Fluid overload, unspecified: Secondary | ICD-10-CM | POA: Diagnosis not present

## 2022-04-27 LAB — BASIC METABOLIC PANEL
Anion gap: 7 (ref 5–15)
BUN: 67 mg/dL — ABNORMAL HIGH (ref 8–23)
CO2: 21 mmol/L — ABNORMAL LOW (ref 22–32)
Calcium: 8.4 mg/dL — ABNORMAL LOW (ref 8.9–10.3)
Chloride: 112 mmol/L — ABNORMAL HIGH (ref 98–111)
Creatinine, Ser: 3.61 mg/dL — ABNORMAL HIGH (ref 0.61–1.24)
GFR, Estimated: 18 mL/min — ABNORMAL LOW (ref >60)
Glucose, Bld: 136 mg/dL — ABNORMAL HIGH (ref 70–99)
Potassium: 4 mmol/L (ref 3.5–5.1)
Sodium: 140 mmol/L (ref 135–145)

## 2022-04-27 MED ORDER — FINASTERIDE 5 MG PO TABS: 5.0000 mg | ORAL_TABLET | Freq: Every day | ORAL | 0 refills | Status: AC

## 2022-04-27 MED ORDER — GABAPENTIN 100 MG PO CAPS: 100.0000 mg | ORAL_CAPSULE | Freq: Three times a day (TID) | ORAL | 0 refills | Status: DC

## 2022-04-27 MED ORDER — TAMSULOSIN HCL 0.4 MG PO CAPS: 0.4000 mg | ORAL_CAPSULE | Freq: Every day | ORAL | 1 refills | Status: DC

## 2022-04-27 NOTE — Plan of Care (Signed)

## 2022-04-27 NOTE — Progress Notes (Signed)
[facility]                                                                               [name], is a 66 y.o. male, DOB - March 27, 1956, [id number] Admit date - 04/24/2022    Outpatient Primary MD for the patient is Clinton Dus, MD  LOS - 3  days    Brief summary   [name] is a 66 y.o. male with medical history significant for severe morbid obesity, chronic diastolic CHF, chronic left lower extremity wound followed by wound care clinic, permanent atrial fibrillation on Eliquis, moderate to severe mitral regurgitation and mild tricuspid regurgitation, hypertension, polyneuropathy, restless leg syndrome, chronic anxiety/depression, who presented to [facility] ED from home due to progressively worsening shortness of breath for the past week, associated with worsening bilateral lower extremity edema.  In the ED, patient noted to be significantly volume overload with acute urinary retention and AKI.  Also noted to have left lower extremity cellulitis for which he was started on cefazolin.  EDP discussed the case with nephrology who recommended Foley catheter insertion for now and hold off IV diuresing.  A Foley catheter was inserted with 3L urine output.  The patient was admitted by [facility], hospitalist service.   Assessment & Plan    Assessment and Plan:  Volume overload secondary to acute on stage 3 b CKD due to Urinary Retention from enlarged prostate.  Foley catheter was placed and 3 lit came out. So far he has 6 lit out.  Nephrology consulted by EDP recommended, monitoring renal parameters and if no improvement to call nephrology consult .  CT renal study shows Severe bilateral hydroureteronephrosis to the level of the urinary bladder, new since previous study. No evidence of ureteral calculi or other obstructing etiology. Markedly distended urinary bladder is suggestive vesicoureteral reflux as the etiology. Creatinine not back to baseline yet.  Hold diuretics  for now as per nephrology.  Potassium is wnl.  Bicarb is 20 today.  Creatinine improved slightly from 5.5 to 4.68 to 3.9 to 3.6 Hold home torsemide and hydrochlorothiazide.  Discussed with nephrology , recommended outpatient referral is sufficient, in view of his imaging of extensive obstruction this had likely been going on for a while and it will take time to resolve.  UA shows small leukocytes, no bacteria and wbc wnl, negative nitrites.  Discussed with Urology, plan for outpatient follow up. Recommended adding finasteride to the flomax.     Body mass index is 43.47 kg/m. Severe Morbid Obesity.  Recommend outpatient follow up with PCP for weight loss .    Chronic left lower extremity wound:  Wound care consult for recommendations.  X rays show Moderate diffuse left calf and foot soft tissue swelling. No cortical erosion is seen to indicate radiographic evidence of acute osteomyelitis. Pt empirically started on cefazolin.  ABI's ordered to check for PAD. They are still pending.  Blood cultures are negative so far.      Chronic diastolic heart failure:  Though bnp is slightly elevated.  Pt denies any sob this morning but is on 2 lit of [place] oxygen.  Echocardiogram today shows no change compared to the  one from 03/2021.    Hypertension:  BP parameters are optimal.    Permanent atrial fibrillation with RVR  Rate control with cardizem and on eliquis for anti coagulation.    Polyneuropathy:  Pt is on gabapentin 300 mg TID.  Due to AKI, will decrease the dose to 100 mg TID .     Restless leg syndrome:  Resume requip.     Chronic anxiety and depression:  Resume home meds .    Estimated body mass index is 43.47 kg/m as calculated from the following:   Height as of this encounter: '6\' 4"'$  (1.93 m).   Weight as of this encounter: 162 kg.  Code Status: full code.  DVT Prophylaxis:   apixaban (ELIQUIS) tablet 5 mg   Level of Care: Level of care: Progressive Family  Communication: none at bedside,   Disposition Plan:     Remains inpatient appropriate:  waiting for renal function to improve.   Procedures:  CT renal study.  Echocardiogram.   Consultants:   Nephrology and Urology over the phone.   Antimicrobials:   Anti-infectives (From admission, onward)    Start     Dose/Rate Route Frequency Ordered Stop   04/25/22 0600  ceFAZolin (ANCEF) IVPB 2g/100 mL premix        2 g 200 mL/hr over 30 Minutes Intravenous Every 12 hours 04/24/22 2055     04/24/22 1730  ceFAZolin (ANCEF) IVPB 2g/100 mL premix        2 g 200 mL/hr over 30 Minutes Intravenous  Once 04/24/22 1708 04/24/22 2133        Medications  Scheduled Meds:  apixaban  5 mg Oral BID   carvedilol  12.5 mg Oral BID WC   Chlorhexidine Gluconate Cloth  6 each Topical Daily   diltiazem  360 mg Oral Daily   DULoxetine  60 mg Oral Daily   finasteride  5 mg Oral Daily   gabapentin  100 mg Oral TID   pantoprazole  40 mg Oral Daily   rOPINIRole  1 mg Oral Q lunch   rOPINIRole  2 mg Oral QHS   tamsulosin  0.4 mg Oral QPC supper   Continuous Infusions:   ceFAZolin (ANCEF) IV 2 g (04/27/22 0625)   PRN Meds:.acetaminophen, albuterol, aspirin EC, ipratropium-albuterol, LORazepam, melatonin, montelukast, polyethylene glycol, prochlorperazine, tiZANidine    Subjective:   [name] was seen and examined today.  Not in distress.   Objective:   Vitals:   04/26/22 1333 04/26/22 2100 04/27/22 0500 04/27/22 0634  BP: (!) 140/81 (!) 142/78  (!) 145/87  Pulse: 86 87  89  Resp: (!) 22 (!) 23  20  Temp: 98.3 F (36.8 C) 98 F (36.7 C)  97.7 F (36.5 C)  TempSrc: Oral Oral  Oral  SpO2: 94%   92%  Weight:   (!) 159.2 kg (!) 162 kg  Height:        Intake/Output Summary (Last 24 hours) at 04/27/2022 1451 Last data filed at 04/27/2022 1400 Gross per 24 hour  Intake 890 ml  Output 2350 ml  Net -1460 ml    Filed Weights   04/26/22 0441 04/27/22 0500 04/27/22 0634  Weight:  (!) 161.8 kg (!) 159.2 kg (!) 162 kg     Exam  General exam: Appears calm and comfortable  Respiratory system: Clear to auscultation. Respiratory effort normal. Cardiovascular system: S1 & S2 heard, RRR. No JVD,  No pedal edema. Gastrointestinal system: Abdomen is nondistended, soft and nontender.  Normal bowel sounds heard. Central nervous system: Alert and oriented. No focal neurological deficits. Extremities: Symmetric 5 x 5 power. Skin: left lower extremity chronic ulcer.  Psychiatry:  Mood & affect appropriate.       Data Reviewed:  I have personally reviewed following labs and imaging studies   CBC Lab Results  Component Value Date   WBC 9.6 04/25/2022   RBC 3.58 (L) 04/25/2022   HGB 10.4 (L) 04/25/2022   HCT 32.7 (L) 04/25/2022   MCV 91.3 04/25/2022   MCH 29.1 04/25/2022   PLT 296 04/25/2022   MCHC 31.8 04/25/2022   RDW 15.0 04/25/2022   LYMPHSABS 0.6 (L) 04/24/2022   MONOABS 1.0 04/24/2022   EOSABS 0.3 04/24/2022   BASOSABS 0.0 91/91/6606     Last metabolic panel Lab Results  Component Value Date   NA 140 04/27/2022   K 4.0 04/27/2022   CL 112 (H) 04/27/2022   CO2 21 (L) 04/27/2022   BUN 67 (H) 04/27/2022   CREATININE 3.61 (H) 04/27/2022   GLUCOSE 136 (H) 04/27/2022   GFRNONAA 18 (L) 04/27/2022   GFRAA 53 (L) 06/27/2019   CALCIUM 8.4 (L) 04/27/2022   PHOS 4.8 (H) 04/25/2022   PROT 6.7 04/24/2022   ALBUMIN 2.8 (L) 04/25/2022   BILITOT 0.6 04/24/2022   ALKPHOS 79 04/24/2022   AST 27 04/24/2022   ALT 32 04/24/2022   ANIONGAP 7 04/27/2022    CBG (last 3)  No results for input(s): "GLUCAP" in the last 72 hours.    Coagulation Profile: No results for input(s): "INR", "PROTIME" in the last 168 hours.   Radiology Studies: No results found.     Hosie Poisson M.D. [facility] 04/27/2022, 2:51 PM  Available via Epic secure chat 7am-7pm After 7 pm, please refer to night coverage provider listed on amion.

## 2022-04-28 ENCOUNTER — Ambulatory Visit (HOSPITAL_BASED_OUTPATIENT_CLINIC_OR_DEPARTMENT_OTHER): Payer: 59 | Admitting: General Surgery

## 2022-04-28 DIAGNOSIS — G4733 Obstructive sleep apnea (adult) (pediatric): Secondary | ICD-10-CM

## 2022-04-28 DIAGNOSIS — L03116 Cellulitis of left lower limb: Secondary | ICD-10-CM | POA: Diagnosis not present

## 2022-04-28 DIAGNOSIS — N179 Acute kidney failure, unspecified: Secondary | ICD-10-CM | POA: Diagnosis not present

## 2022-04-28 DIAGNOSIS — R339 Retention of urine, unspecified: Secondary | ICD-10-CM | POA: Diagnosis not present

## 2022-04-28 DIAGNOSIS — E8770 Fluid overload, unspecified: Secondary | ICD-10-CM | POA: Diagnosis not present

## 2022-04-28 LAB — BASIC METABOLIC PANEL
Anion gap: 9 (ref 5–15)
BUN: 62 mg/dL — ABNORMAL HIGH (ref 8–23)
CO2: 23 mmol/L (ref 22–32)
Calcium: 8.8 mg/dL — ABNORMAL LOW (ref 8.9–10.3)
Chloride: 109 mmol/L (ref 98–111)
Creatinine, Ser: 3.5 mg/dL — ABNORMAL HIGH (ref 0.61–1.24)
GFR, Estimated: 19 mL/min — ABNORMAL LOW (ref >60)
Glucose, Bld: 111 mg/dL — ABNORMAL HIGH (ref 70–99)
Potassium: 4.2 mmol/L (ref 3.5–5.1)
Sodium: 141 mmol/L (ref 135–145)

## 2022-04-28 MED ORDER — CARVEDILOL 12.5 MG PO TABS: 12.5000 mg | ORAL_TABLET | Freq: Two times a day (BID) | ORAL | 2 refills | Status: DC

## 2022-04-28 MED ORDER — CEPHALEXIN 500 MG PO CAPS: 500.0000 mg | ORAL_CAPSULE | Freq: Four times a day (QID) | ORAL | 0 refills | Status: AC

## 2022-04-28 NOTE — Plan of Care (Signed)

## 2022-04-28 NOTE — Progress Notes (Signed)
  Transition of Care [facility]) Screening Note   Patient Details  Name: [name] Date of Birth: 1955/12/18   Transition of Care [facility]) CM/SW Contact:    Roseanne Kaufman, RN Phone Number: 04/28/2022, 3:47 PM    Transition of Care Department [facility]) has reviewed patient and no TOC needs have been identified at this time. We will continue to monitor patient advancement through interdisciplinary progression rounds. If new patient transition needs arise, please place a TOC consult.

## 2022-04-29 LAB — CULTURE, BLOOD (ROUTINE X 2)
Culture: NO GROWTH
Culture: NO GROWTH
Special Requests: ADEQUATE

## 2022-04-30 ENCOUNTER — Encounter (HOSPITAL_BASED_OUTPATIENT_CLINIC_OR_DEPARTMENT_OTHER): Payer: 59 | Admitting: General Surgery

## 2022-05-01 ENCOUNTER — Encounter (HOSPITAL_BASED_OUTPATIENT_CLINIC_OR_DEPARTMENT_OTHER): Payer: 59 | Admitting: General Surgery

## 2022-05-01 DIAGNOSIS — L97811 Non-pressure chronic ulcer of other part of right lower leg limited to breakdown of skin: Secondary | ICD-10-CM | POA: Diagnosis not present

## 2022-05-01 DIAGNOSIS — L97822 Non-pressure chronic ulcer of other part of left lower leg with fat layer exposed: Secondary | ICD-10-CM | POA: Diagnosis present

## 2022-05-01 DIAGNOSIS — I420 Dilated cardiomyopathy: Secondary | ICD-10-CM | POA: Diagnosis not present

## 2022-05-01 DIAGNOSIS — R339 Retention of urine, unspecified: Secondary | ICD-10-CM | POA: Diagnosis not present

## 2022-05-01 DIAGNOSIS — L97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 other part of left lower leg limited to breakdown of skin: Secondary | ICD-10-CM | POA: Diagnosis not present

## 2022-05-01 DIAGNOSIS — I110 Hypertensive heart disease with heart failure: Secondary | ICD-10-CM | POA: Diagnosis not present

## 2022-05-01 DIAGNOSIS — Z6839 Body mass index (BMI) 39.0-39.9, adult: Secondary | ICD-10-CM | POA: Diagnosis not present

## 2022-05-01 DIAGNOSIS — I509 Heart failure, unspecified: Secondary | ICD-10-CM | POA: Diagnosis not present

## 2022-05-01 DIAGNOSIS — R600 Localized edema: Secondary | ICD-10-CM | POA: Diagnosis not present

## 2022-05-01 NOTE — Discharge Summary (Signed)
Physician Discharge Summary   Patient: [name] MRN: [id number] DOB: 08/29/1955  Admit date:     04/24/2022  Discharge date: 04/28/2022  Discharge Physician: Hosie Poisson   PCP: [name], MD   Recommendations at discharge:  Please follow up with PCP in one week.  Please follow up with Nephrology in one week.  Please check cbc and bmp in one week.  You are being discharged on foley catheter, recommend follow up with Urology in one week.  Discussed with Dr Jeffie Pollock.  Please follow up with sleep study outpatient for undiagnosed OSA.    Discharge Diagnoses: Principal Problem:   Volume overload    Hospital Course: [name] is a 66 y.o. male with medical history significant for severe morbid obesity, chronic diastolic CHF, chronic left lower extremity wound followed by wound care clinic, permanent atrial fibrillation on Eliquis, moderate to severe mitral regurgitation and mild tricuspid regurgitation, hypertension, polyneuropathy, restless leg syndrome, chronic anxiety/depression, who presented to [facility] ED from home due to progressively worsening shortness of breath for the past week, associated with worsening bilateral lower extremity edema.  In the ED, patient noted to be significantly volume overload with acute urinary retention and AKI.  Also noted to have left lower extremity cellulitis for which he was started on cefazolin.  EDP discussed the case with nephrology who recommended Foley catheter insertion for now and hold off IV diuresing.  A Foley catheter was inserted with 3L urine output.  The patient was admitted by [facility], hospitalist service.  Assessment and Plan: Volume overload secondary to acute on stage 3 b CKD due to Urinary Retention from enlarged prostate.  Foley catheter was placed and 3 lit came out. So far he has 7 lit out.  Nephrology consulted by EDP recommended, monitoring renal parameters and if no improvement to call nephrology consult .  CT renal  study shows Severe bilateral hydroureteronephrosis to the level of the urinary bladder, new since previous study. No evidence of ureteral calculi or other obstructing etiology. Markedly distended urinary bladder is suggestive vesicoureteral reflux as the etiology. Creatinine not back to baseline yet.  Hold diuretics for now as per nephrology.  Potassium is wnl.  Bicarb is 20 today.  Creatinine improved slightly from 5.5 to 4.68 to 3.9 to 3.6 Hold home torsemide and hydrochlorothiazide.  Discussed with nephrology , recommended outpatient referral is sufficient, in view of his imaging of extensive obstruction this had likely been going on for a while and it will take time to resolve.  UA shows small leukocytes, no bacteria and wbc wnl, negative nitrites.  Discussed with Urology, plan for outpatient follow up. Recommended adding finasteride to the flomax.        Body mass index is 43.47 kg/m. Severe Morbid Obesity.  Recommend outpatient follow up with PCP for weight loss .      Chronic left lower extremity wound:  Wound care consult for recommendations.  X rays show Moderate diffuse left calf and foot soft tissue swelling. No cortical erosion is seen to indicate radiographic evidence of acute osteomyelitis. Pt empirically started on cefazolin.  ABI's ordered to check for PAD. They are still pending. Notified by vascular tech that the ABI'Swere already done by the wound care surgeon last month.  He will need outpatient follow up with wound care surgeon for better wound care.  Blood cultures are negative so far.          Chronic diastolic heart failure:  Though bnp is slightly elevated.  Pt denies any sob this morning but is on 2 lit of [place] oxygen.  Echocardiogram today shows no change compared to the one from 03/2021.      Hypertension:  BP parameters are optimal.      Permanent atrial fibrillation with RVR  Rate control with cardizem and on eliquis for anti coagulation.       Polyneuropathy:  Pt is on gabapentin 300 mg TID.  Due to AKI, will decrease the dose to 100 mg TID .        Restless leg syndrome:  Resume requip.        Chronic anxiety and depression:  Resume home meds .      Estimated body mass index is 43.47 kg/m as calculated from the following:   Height as of this encounter: '6\' 4"'$  (1.93 m).   Weight as of this encounter: 162 kg.    Consultants: nephrology Urology.  Procedures performed: none.   Disposition: Home Diet recommendation:  Discharge Diet Orders (From admission, onward)     Start     Ordered   04/28/22 0000  Diet - low sodium heart healthy        04/28/22 1506   04/27/22 0000  Diet - low sodium heart healthy        04/27/22 1158           Regular diet DISCHARGE MEDICATION: Allergies as of 04/28/2022       Reactions   Other Other (See Comments)   "Hycomine" cough syrup = dizziness   Hydrocodone Other (See Comments)   GI upset, dizziness        Medication List     STOP taking these medications    hydrALAZINE 100 MG tablet Commonly known as: APRESOLINE   hydrochlorothiazide 25 MG tablet Commonly known as: HYDRODIURIL   sulfamethoxazole-trimethoprim 800-160 MG tablet Commonly known as: BACTRIM DS   torsemide 20 MG tablet Commonly known as: DEMADEX       TAKE these medications    aspirin EC 81 MG tablet Take 81 mg by mouth daily as needed (for chest pain). Swallow whole.   carvedilol 12.5 MG tablet Commonly known as: COREG Take 1 tablet (12.5 mg total) by mouth 2 (two) times daily with a meal. What changed:  medication strength how much to take when to take this   cephALEXin 500 MG capsule Commonly known as: KEFLEX Take 1 capsule (500 mg total) by mouth 4 (four) times daily for 3 days.   Cialis 5 MG tablet Generic drug: tadalafil Take 5 mg by mouth daily as needed (as directed).   diltiazem 360 MG 24 hr capsule Commonly known as: CARDIZEM CD Take 1 capsule (360 mg total) by  mouth daily. Please keep scheduled appointment for additional refills. What changed: additional instructions   DULoxetine 60 MG capsule Commonly known as: CYMBALTA Take 60 mg by mouth 2 (two) times daily.   Eliquis 5 MG Tabs tablet Generic drug: apixaban TAKE 1 TABLET(5 MG) BY MOUTH TWICE DAILY What changed: See the new instructions.   finasteride 5 MG tablet Commonly known as: PROSCAR Take 1 tablet (5 mg total) by mouth daily.   gabapentin 100 MG capsule Commonly known as: NEURONTIN Take 1 capsule (100 mg total) by mouth 3 (three) times daily. What changed:  how much to take Another medication with the same name was removed. Continue taking this medication, and follow the directions you see here.   LORazepam 0.5 MG tablet Commonly known as: ATIVAN Take  0.5 mg by mouth every 8 (eight) hours as needed for anxiety.   montelukast 10 MG tablet Commonly known as: SINGULAIR Take 10 mg by mouth at bedtime as needed (for seasonal allergies).   omeprazole 20 MG capsule Commonly known as: PRILOSEC Take 20 mg by mouth daily as needed (for acid reflux).   rOPINIRole 1 MG tablet Commonly known as: REQUIP Take 1-2 tablets (1-2 mg total) by mouth at bedtime as needed (restless leg syndrome). 1 tablet after lunch and 2 tablet at bedtime What changed:  how much to take when to take this additional instructions   tamsulosin 0.4 MG Caps capsule Commonly known as: FLOMAX Take 1 capsule (0.4 mg total) by mouth daily after supper.   tiZANidine 4 MG tablet Commonly known as: ZANAFLEX Take 4-8 mg by mouth 3 (three) times daily as needed for muscle spasms.               Discharge Care Instructions  (From admission, onward)           Start     Ordered   04/28/22 0000  Discharge wound care:       Comments: Gently cleanse LLE leg wounds with saline, pat dry. Place as many Xeroform gauzes Kellie Simmering [phone]) as needed to cover the areas, top with ABD pad, secure with Kerlix. Place  foot into Prevalon boot   04/28/22 1506   04/27/22 0000  Discharge wound care:       Comments: Gently cleanse LLE leg wounds with saline, pat dry. Place as many Xeroform gauzes Kellie Simmering [phone]) as needed to cover the areas, top with ABD pad, secure with Kerlix. Place foot into Prevalon boot.   04/27/22 1158            Follow-up Information     [name], MD. Schedule an appointment as soon as possible for a visit in 1 week(s).   Specialty: Family Medicine Contact information: [place] [address] [place], [place]. Schedule an appointment as soon as possible for a visit in 1 week(s).   Contact information: [place] [address] [phone]         Vira Agar, MD. Schedule an appointment as soon as possible for a visit in 1 week(s).   Specialty: Urology Why: urinary retention, has a foley catheter. Contact information: [place] [place] [address] [phone]                Discharge Exam: Danley Danker Weights   04/27/22 0500 04/27/22 0634 04/28/22 0612  Weight: (!) 159.2 kg (!) 162 kg (!) 158.6 kg   General exam: Appears calm and comfortable  Respiratory system: Clear to auscultation. Respiratory effort normal. Cardiovascular system: S1 & S2 heard, RRR. No JVD, pedal edema present.  Gastrointestinal system: Abdomen is soft non tender.  Central nervous system: Alert and oriented. No focal neurological deficits. Extremities: Symmetric 5 x 5 power. Skin: left leg chronic wound.  Psychiatry: Mood & affect appropriate.    Condition at discharge: fair  The results of significant diagnostics from this hospitalization (including imaging, microbiology, ancillary and laboratory) are listed below for reference.   Imaging Studies: ECHOCARDIOGRAM COMPLETE  Result Date: 04/25/2022    ECHOCARDIOGRAM REPORT   Patient Name:   ALDAHIR LITAKER Date of Exam: 04/25/2022  Medical Rec #:  [id number]         Height:  76.0 in Accession #:    [id number]        Weight:       350.1 lb Date of Birth:  03/05/1956        BSA:          2.810 m Patient Age:    89 years          BP:           169/102 mmHg Patient Gender: M                 HR:           90 bpm. Exam Location:  Inpatient Procedure: 2D Echo, Color Doppler and Cardiac Doppler Indications:    CHF- Acute Diastolic  History:        Patient has prior history of Echocardiogram examinations, most                 recent 04/02/2021. Arrythmias:Atrial Fibrillation; Risk                 Factors:Hypertension and Dyslipidemia.  Referring Phys: [id number] [place]  1. Left ventricular ejection fraction, by estimation, is 60 to 65%. The left ventricle has normal function. The left ventricle has no regional wall motion abnormalities. There is mild left ventricular hypertrophy. Left ventricular diastolic parameters are indeterminate.  2. Right ventricular systolic function is normal. The right ventricular size is normal.  3. Left atrial size was mildly dilated.  4. Mild mitral valve regurgitation.  5. The aortic valve is normal in structure. Aortic valve regurgitation is not visualized. Comparison(s): The left ventricular function is unchanged compared to echo from September 2022.Marland Kitchen FINDINGS  Left Ventricle: Left ventricular ejection fraction, by estimation, is 60 to 65%. The left ventricle has normal function. The left ventricle has no regional wall motion abnormalities. The left ventricular internal cavity size was normal in size. There is  mild left ventricular hypertrophy. Left ventricular diastolic parameters are indeterminate. Right Ventricle: The right ventricular size is normal. Right vetricular wall thickness was not assessed. Right ventricular systolic function is normal. Left Atrium: Left atrial size was mildly dilated. Right Atrium: Right atrial size was normal in size. Pericardium: Trivial pericardial effusion is  present. Mitral Valve: There is mild thickening of the mitral valve leaflet(s). Mild mitral valve regurgitation. Tricuspid Valve: The tricuspid valve is normal in structure. Tricuspid valve regurgitation is trivial. Aortic Valve: The aortic valve is normal in structure. Aortic valve regurgitation is not visualized. Aortic valve mean gradient measures 4.0 mmHg. Aortic valve peak gradient measures 7.3 mmHg. Aortic valve area, by VTI measures 2.93 cm. Pulmonic Valve: The pulmonic valve was normal in structure. Pulmonic valve regurgitation is not visualized. Aorta: The aortic root and ascending aorta are structurally normal, with no evidence of dilitation. IAS/Shunts: The interatrial septum was not assessed.  LEFT VENTRICLE PLAX 2D LVIDd:         5.40 cm LVIDs:         3.70 cm LV PW:         1.20 cm LV IVS:        1.30 cm LVOT diam:     2.30 cm LV SV:         72 LV SV Index:   26 LVOT Area:     4.15 cm  RIGHT VENTRICLE TAPSE (M-mode): 1.8 cm LEFT ATRIUM              Index  RIGHT ATRIUM           Index LA diam:        6.00 cm  2.13 cm/m   RA Area:     18.60 cm LA Vol (A2C):   124.5 ml 44.30 ml/m  RA Volume:   45.70 ml  16.26 ml/m LA Vol (A4C):   109.0 ml 38.79 ml/m LA Biplane Vol: 106.0 ml 37.72 ml/m  AORTIC VALVE AV Area (Vmax):    3.23 cm AV Area (Vmean):   3.03 cm AV Area (VTI):     2.93 cm AV Vmax:           135.00 cm/s AV Vmean:          95.600 cm/s AV VTI:            0.247 m AV Peak Grad:      7.3 mmHg AV Mean Grad:      4.0 mmHg LVOT Vmax:         105.00 cm/s LVOT Vmean:        69.700 cm/s LVOT VTI:          0.174 m LVOT/AV VTI ratio: 0.70  AORTA Ao Root diam: 3.40 cm Ao Asc diam:  3.80 cm MR Peak grad: 76.2 mmHg MR Vmax:      436.50 cm/s SHUNTS                           Systemic VTI:  0.17 m                           Systemic Diam: 2.30 cm Dorris Carnes MD Electronically signed by Dorris Carnes MD Signature Date/Time: 04/25/2022/10:56:14 AM    Final    CT Renal Stone Study  Result Date:  04/24/2022 CLINICAL DATA:  Decreased urine output.  Acute kidney injury. EXAM: CT ABDOMEN AND PELVIS WITHOUT CONTRAST TECHNIQUE: Multidetector CT imaging of the abdomen and pelvis was performed following the standard protocol without IV contrast. RADIATION DOSE REDUCTION: This exam was performed according to the departmental dose-optimization program which includes automated exposure control, adjustment of the mA and/or kV according to patient size and/or use of iterative reconstruction technique. COMPARISON:  01/13/2013 FINDINGS: Lower chest: No acute findings. Hepatobiliary: No mass visualized on this unenhanced exam. Gallbladder is unremarkable. No evidence of biliary ductal dilatation. Pancreas: No mass or inflammatory process visualized on this unenhanced exam. Spleen:  Within normal limits in size. Adrenals/Urinary tract: Simple appearing right renal cyst is again seen measuring approximately 9 cm (no followup imaging is recommended). Severe bilateral hydroureteronephrosis is seen to the level of the urinary bladder which is new since previous study. No evidence of renal or ureteral calculi. The urinary bladder is also markedly distended, and this raises suspicion for vesicoureteral reflux and urinary retention. Stomach/Bowel: No evidence of obstruction, inflammatory process, or abnormal fluid collections. Vascular/Lymphatic: No pathologically enlarged lymph nodes identified. No evidence of abdominal aortic aneurysm. Reproductive: Moderately enlarged prostate gland again seen, with mass effect on bladder base. Other:  None. Musculoskeletal:  No suspicious bone lesions identified. IMPRESSION: Severe bilateral hydroureteronephrosis to the level of the urinary bladder, new since previous study. No evidence of ureteral calculi or other obstructing etiology. Markedly distended urinary bladder is suggestive vesicoureteral reflux as the etiology. Recommend clinical correlation for acute urinary retention. Moderately  enlarged prostate gland. Electronically Signed   By: Marlaine Hind M.D.   On: 04/24/2022  18:52   DG Tibia/Fibula Left  Result Date: 04/24/2022 CLINICAL DATA:  Left leg cellulitis. Shortness of breath for 1 week. EXAM: LEFT FOOT - COMPLETE 3+ VIEW; LEFT TIBIA AND FIBULA - 2 VIEW COMPARISON:  Left ankle radiographs 10/17/2008 FINDINGS: Left tibia and fibula: Mild medial compartment of the knee joint space narrowing. Old healed fracture of the proximal fibular diaphysis with moderate chronic healing callus formation and mild anterior displacement of the distal component. Mild patellofemoral joint space narrowing. Moderate anterior and posterior distal tibial plafond and dorsal talar dome-talar neck degenerative spurring. Redemonstration of moderate chronic corticated ossicles adjacent to the distal medial malleolus. Mild distal fibula and adjacent lateral talar process degenerative spurring. Left foot: Mild great toe metatarsophalangeal and diffuse first through fifth interphalangeal joint space narrowing and peripheral osteophytosis. Mild likely chronic spurring at the lateral base of the proximal phalanx of the great toe. No acute fracture or dislocation. Moderate diffuse left calf and foot soft tissue swelling. No cortical erosion is seen. IMPRESSION: 1. Moderate diffuse left calf and foot soft tissue swelling. No cortical erosion is seen to indicate radiographic evidence of acute osteomyelitis. 2. Mild-to-moderate left ankle osteoarthritis. 3. Mild left great toe metatarsophalangeal and interphalangeal joint osteoarthritis. Electronically Signed   By: Yvonne Kendall M.D.   On: 04/24/2022 17:45   DG Foot Complete Left  Result Date: 04/24/2022 CLINICAL DATA:  Left leg cellulitis. Shortness of breath for 1 week. EXAM: LEFT FOOT - COMPLETE 3+ VIEW; LEFT TIBIA AND FIBULA - 2 VIEW COMPARISON:  Left ankle radiographs 10/17/2008 FINDINGS: Left tibia and fibula: Mild medial compartment of the knee joint space  narrowing. Old healed fracture of the proximal fibular diaphysis with moderate chronic healing callus formation and mild anterior displacement of the distal component. Mild patellofemoral joint space narrowing. Moderate anterior and posterior distal tibial plafond and dorsal talar dome-talar neck degenerative spurring. Redemonstration of moderate chronic corticated ossicles adjacent to the distal medial malleolus. Mild distal fibula and adjacent lateral talar process degenerative spurring. Left foot: Mild great toe metatarsophalangeal and diffuse first through fifth interphalangeal joint space narrowing and peripheral osteophytosis. Mild likely chronic spurring at the lateral base of the proximal phalanx of the great toe. No acute fracture or dislocation. Moderate diffuse left calf and foot soft tissue swelling. No cortical erosion is seen. IMPRESSION: 1. Moderate diffuse left calf and foot soft tissue swelling. No cortical erosion is seen to indicate radiographic evidence of acute osteomyelitis. 2. Mild-to-moderate left ankle osteoarthritis. 3. Mild left great toe metatarsophalangeal and interphalangeal joint osteoarthritis. Electronically Signed   By: Yvonne Kendall M.D.   On: 04/24/2022 17:45   DG Chest 2 View  Result Date: 04/24/2022 CLINICAL DATA:  Shortness of breath. EXAM: CHEST - 2 VIEW COMPARISON:  Chest radiograph dated 04/17/2021. FINDINGS: Mild eventration of the right hemidiaphragm. There is mild cardiomegaly with mild vascular congestion. No focal consolidation, pleural effusion or pneumothorax. Degenerative changes of the spine. No acute osseous pathology. IMPRESSION: Mild cardiomegaly with mild vascular congestion. Electronically Signed   By: Anner Crete M.D.   On: 04/24/2022 17:43    Microbiology: Results for orders placed or performed during the hospital encounter of 04/24/22  Blood culture (routine x 2)     Status: None   Collection Time: 04/24/22  5:47 PM   Specimen: BLOOD  Result  Value Ref Range Status   Specimen Description   Final    BLOOD BLOOD RIGHT HAND Performed at [facility] [address] [place],  [place] [address]    Special Requests   Final    BOTTLES DRAWN AEROBIC AND ANAEROBIC Blood Culture results may not be optimal due to an excessive volume of blood received in culture bottles Performed at [facility] [address], [place], [place] [address]    Culture   Final    NO GROWTH 5 DAYS Performed at [facility], [place] [address], [place], [place] [address]    Report Status 04/29/2022 FINAL  Final  Blood culture (routine x 2)     Status: None   Collection Time: 04/24/22  5:53 PM   Specimen: BLOOD  Result Value Ref Range Status   Specimen Description   Final    BLOOD LEFT ANTECUBITAL Performed at [facility] [address], [place], [place] [address]    Special Requests   Final    BOTTLES DRAWN AEROBIC AND ANAEROBIC Blood Culture adequate volume Performed at [facility] [address], [place], [place] [address]    Culture   Final    NO GROWTH 5 DAYS Performed at [facility], [place] [address], [place], [place] [address]    Report Status 04/29/2022 FINAL  Final    Labs: CBC: Recent Labs  Lab 04/24/22 1646 04/24/22 1707 04/25/22 0556  WBC 11.6*  --  9.6  NEUTROABS 9.6*  --   --   HGB 9.3* 9.5* 10.4*  HCT 29.2* 28.0* 32.7*  MCV 90.7  --  91.3  PLT 235  --  546   Basic Metabolic Panel: Recent Labs  Lab 04/25/22 0556 04/25/22 1107 04/26/22 1125 04/27/22 0434 04/28/22 0407  NA 140 136 141 140 141  K 3.9 3.7 4.5 4.0 4.2  CL 110 107 112* 112* 109  CO2 20* 19* 21* 21* 23  GLUCOSE 179* 119* 129* 136* 111*  BUN 96* 98* 74* 67* 62*  CREATININE 4.68* 4.52* 3.90* 3.61* 3.50*  CALCIUM 8.4* 8.3* 8.5* 8.4* 8.8*  MG 2.3  --   --   --   --   PHOS 4.8*  --   --   --   --    Liver Function Tests: Recent Labs  Lab 04/24/22 1646 04/25/22 0556   AST 27  --   ALT 32  --   ALKPHOS 79  --   BILITOT 0.6  --   PROT 6.7  --   ALBUMIN 2.7* 2.8*   CBG: No results for input(s): "GLUCAP" in the last 168 hours.  Discharge time spent: 38 minutes.   Signed: Hosie Poisson, MD [facility] 05/01/2022

## 2022-05-05 ENCOUNTER — Encounter (HOSPITAL_BASED_OUTPATIENT_CLINIC_OR_DEPARTMENT_OTHER): Payer: 59 | Admitting: General Surgery

## 2022-05-06 NOTE — Progress Notes (Addendum)
AMAREE, LEEPER ([id number]) [id number] Page 1 of 7 Visit Report for 05/01/2022 Arrival Information Details Patient Name: Date of Service: Clinton Liner RD O. 05/01/2022 7:45 A M Medical Record Number: [id number] Patient Account Number: [id number] Date of Birth/Sex: Treating RN: Jun 16, 1956 (66 y.o. [name] Primary Care [name]: [name] Other Clinician: Referring [name]: Treating [name]/Extender: [name] in Treatment: 9 Visit Information History Since Last Visit Added or deleted any medications: No Patient Arrived: Ambulatory Any new allergies or adverse reactions: No Arrival Time: 07:41 Had a fall or experienced change in No Accompanied By: wife activities of daily living that may affect Transfer Assistance: None risk of falls: Patient Requires Transmission-Based Precautions: No Signs or symptoms of abuse/neglect since last visito No Patient Has Alerts: No Hospitalized since last visit: No Implantable device outside of the clinic excluding No cellular tissue based products placed in the center since last visit: Has Dressing in Place as Prescribed: No Has Compression in Place as Prescribed: No Pain Present Now: No Electronic Signature(s) Signed: 05/06/2022 4:16:20 PM By: Clinton East RN Entered By: [name] on 05/01/2022 07:42:05 -------------------------------------------------------------------------------- Compression Therapy Details Patient Name: Date of Service: Clinton Liner RD O. 05/01/2022 7:45 A M Medical Record Number: [id number] Patient Account Number: [id number] Date of Birth/Sex: Treating RN: October 02, 1955 (66 y.o. [name] Primary Care [name]: [name] Other Clinician: Referring [name]: Treating [name]/Extender: [name] in Treatment: 9 Compression Therapy Performed for Wound Assessment: Wound #1 Anterior Lower Leg Performed  By: Clinician Clinton East, RN Compression Type: Three Layer Post Procedure Diagnosis Same as Pre-procedure Electronic Signature(s) Signed: 05/06/2022 4:16:20 PM By: Clinton East RN Entered By: [name] on 05/01/2022 08:05:30 [name] ([id number]) [id number] Page 2 of 7 -------------------------------------------------------------------------------- Encounter Discharge Information Details Patient Name: Date of Service: Clinton Liner RD O. 05/01/2022 7:45 A M Medical Record Number: [id number] Patient Account Number: [id number] Date of Birth/Sex: Treating RN: 04-23-1956 (66 y.o. [name] Primary Care [name]: [name] Other Clinician: Referring [name]: Treating [name]/Extender: [name] in Treatment: 9 Encounter Discharge Information Items Post Procedure Vitals Discharge Condition: Stable Temperature (F): 98.2 Ambulatory Status: Ambulatory Pulse (bpm): 108 Discharge Destination: Home Respiratory Rate (breaths/min): 20 Transportation: Private Auto Blood Pressure (mmHg): 169/104 Accompanied By: wife Schedule Follow-up Appointment: No Clinical Summary of Care: Notes BP elevated, pt stated he took his BP meds late this AM Electronic Signature(s) Signed: 05/06/2022 4:16:20 PM By: Clinton East RN Entered By: [name] on 05/01/2022 08:34:36 -------------------------------------------------------------------------------- Lower Extremity Assessment Details Patient Name: Date of Service: [facility] O. 05/01/2022 7:45 A M Medical Record Number: [id number] Patient Account Number: [id number] Date of Birth/Sex: Treating RN: [birth year] (66 y.o. [name] Primary Care Rhian Funari: [name] Other Clinician: Referring [name]: Treating [name]/Extender: [name] in Treatment: 9 [name]: No] [Right: No] [Left: Clinton]  [Right: :] Calf Left: Right: Point of Measurement: 38 cm From Medial Instep 53 cm Ankle Left: Right: Point of Measurement: 11 cm From Medial Instep 30 cm Vascular Assessment Pulses: Dorsalis Pedis Palpable: [Left:Yes] Electronic Signature(s) Signed: 05/06/2022 4:16:20 PM By: Clinton East RN Entered By: [name] on 05/01/2022 07:51:09 [name] ([id number]) [id number] Page 3 of 7 -------------------------------------------------------------------------------- Multi Wound Chart Details Patient Name: Date of Service: Clinton Liner RD O. 05/01/2022 7:45 A M Medical Record Number: [id number] Patient Account Number: [id number] Date of Birth/Sex: Treating RN:  [birth year] (66 y.o. [name] Primary Care [name]: [name] Other Clinician: Referring [name]: Treating [name]/Extender: [name] in Treatment: 9 Vital Signs Height(in): 76 Pulse(bpm): 108 Weight(lbs): 322 Blood Pressure(mmHg): 169/104 Body Mass Index(BMI): 39.2 Temperature(F): 98.2 Respiratory Rate(breaths/min): 20 [1:Photos:] [N/A:N/A] Anterior Lower Leg N/A N/A Wound Location: Blister N/A N/A Wounding Event: Venous Leg Ulcer N/A N/A Primary Etiology: [name], Lymphedema, Sleep N/A N/A Comorbid History: Apnea, Congestive Heart Failure, Coronary Artery Disease, Hypertension, Confinement Anxiety 09/25/2021 N/A N/A Date Acquired: 9 N/A N/A Weeks of Treatment: Open N/A N/A Wound Status: No N/A N/A Wound Recurrence: Yes N/A N/A Clustered Wound: 15x17x0.1 N/A N/A Measurements L x W x D (cm) 200.277 N/A N/A A (cm) : rea 20.028 N/A N/A Volume (cm) : -3884.00% N/A N/A % Reduction in A rea: -3881.70% N/A N/A % Reduction in Volume: Full Thickness Without Exposed N/A N/A Classification: Support Structures Large N/A N/A Exudate A mount: Serous N/A N/A Exudate Type: amber N/A N/A Exudate Color: Small  (1-33%) N/A N/A Granulation A mount: Red, Pink N/A N/A Granulation Quality: Large (67-100%) N/A N/A Necrotic A mount: Eschar, Adherent Slough N/A N/A Necrotic Tissue: Fat Layer (Subcutaneous Tissue): Yes N/A N/A Exposed Structures: Fascia: No Tendon: No Muscle: No Joint: No Bone: No Medium (34-66%) N/A N/A Epithelialization: Debridement - Excisional N/A N/A Debridement: Pre-procedure Verification/Time Out 08:01 N/A N/A Taken: Lidocaine 5% topical ointment N/A N/A Pain Control: Subcutaneous, Slough N/A N/A Tissue Debrided: Skin/Subcutaneous Tissue N/A N/A Level: 255 N/A N/A Debridement A (sq cm): rea Curette N/A N/A Instrument: Minimum N/A N/A Bleeding: Pressure N/A N/A Hemostasis A chieved: Procedure was tolerated well N/A N/A Debridement Treatment Response: 15x17x0.1 N/A N/A Post Debridement Measurements L x W x D (cm) 20.028 N/A N/A Post Debridement Volume: (cm) [name], [name] ([id number]) [id number] Page 4 of 7 Excoriation: Yes N/A N/A Periwound Skin Texture: Maceration: Yes N/A N/A Periwound Skin Moisture: Erythema: Yes N/A N/A Periwound Skin Color: Hemosiderin Staining: Yes Measured: 15cm N/A N/A Erythema Measurement: No Abnormality N/A N/A Temperature: Compression Therapy N/A N/A Procedures Performed: Debridement Treatment Notes Wound #1 (Lower Leg) Wound Laterality: Anterior Cleanser Soap and Water Discharge Instruction: May shower and wash wound with dial antibacterial soap and water prior to dressing change. Peri-Wound Care Sween Lotion (Moisturizing lotion) Discharge Instruction: Apply moisturizing lotion as directed Topical Mupirocin Ointment Discharge Instruction: Apply Mupirocin (Bactroban) as instructed Triamcinolone Discharge Instruction: Apply Triamcinolone as directed Primary Dressing KerraCel Ag Gelling Fiber Dressing, 4x5 in (silver alginate) Discharge Instruction: Apply silver alginate to wound  bed as instructed Secondary Dressing Zetuvit Plus Silicone Border Dressing 4x4 (in/in) Discharge Instruction: Apply silicone border over primary dressing as directed. Secured With [employer] Surgical T 2x10 (in/yd) ape Discharge Instruction: Secure with tape as directed. Compression Wrap ThreePress (3 layer compression wrap) Discharge Instruction: Apply three layer compression as directed. Compression Stockings Add-Ons Electronic Signature(s) Signed: 05/01/2022 8:22:55 AM By: Fredirick Maudlin MD FACS Signed: 05/01/2022 4:24:03 PM By: [name] By: Fredirick Maudlin on 05/01/2022 08:22:55 -------------------------------------------------------------------------------- [place] Details Patient Name: Date of Service: Clinton Liner RD O. 05/01/2022 7:45 A M Medical Record Number: [id number] Patient Account Number: [id number] Date of Birth/Sex: Treating RN: 1956/06/11 (66 y.o. [name] Primary Care Junius Faucett: [name] Other Clinician: Referring Kyree Adriano: Treating Shylah Dossantos/Extender: [name] in Treatment: [address] [place], [place] ([id number]) [id number] Page 5 of 7 Electronic Signature(s) Signed: 07/08/2022 12:10:21 PM By: Clinton East RN Previous Signature:  05/06/2022 4:16:20 PM Version By: Clinton East RN Entered By: [name] on 07/08/2022 12:10:20 -------------------------------------------------------------------------------- Pain Assessment Details Patient Name: Date of Service: Clinton Liner RD O. 05/01/2022 7:45 A M Medical Record Number: [id number] Patient Account Number: [id number] Date of Birth/Sex: Treating RN: 12-27-1955 (66 y.o. [name] Primary Care Monifah Freehling: [name] Other Clinician: Referring Lancelot Alyea: Treating Viyan Rosamond/Extender: [name] in Treatment: 9 Active Problems Location of Pain  Severity and Description of Pain Patient Has Paino No Site Locations Rate the pain. Current Pain Level: 0 Pain Management and Medication Current Pain Management: Electronic Signature(s) Signed: 05/06/2022 4:16:20 PM By: Clinton East RN Entered By: [name] on 05/01/2022 07:50:57 -------------------------------------------------------------------------------- Patient/Caregiver Education Details Patient Name: Date of Service: Clinton Liner RD O. 10/26/2023andnbsp7:45 A M Medical Record Number: [id number] Patient Account Number: [id number] Date of Birth/Gender: Treating RN: 19-Sep-1955 (66 y.o. [name] Primary Care Physician: [name] Other Clinician: Referring Physician: Treating Physician/Extender: [name] in Treatment: 9 Education Assessment Education Provided To: [name], [name] ([id number]) [id number] Page 6 of 7 Patient Education Topics Provided Wound/Skin Impairment: Methods: Explain/Verbal Responses: Reinforcements needed, State content correctly Electronic Signature(s) Signed: 05/06/2022 4:16:20 PM By: Clinton East RN Entered By: [name] on 05/01/2022 07:56:28 -------------------------------------------------------------------------------- Wound Assessment Details Patient Name: Date of Service: Clinton Liner RD O. 05/01/2022 7:45 A M Medical Record Number: [id number] Patient Account Number: [id number] Date of Birth/Sex: Treating RN: [birth year] (66 y.o. [name] Primary Care Simren Popson: [name] Other Clinician: Referring Amarah Brossman: Treating Karoline Fleer/Extender: [name] in Treatment: 9 Wound Status Wound Number: 1 Primary Venous Leg Ulcer Etiology: Wound Location: Anterior Lower Leg Wound Open Wounding Event: Blister Status: Date Acquired: 09/25/2021 Comorbid [name], Lymphedema, Sleep Apnea, Congestive Heart Failure, Weeks Of  Treatment: 9 History: Coronary Artery Disease, Hypertension, Confinement Anxiety Clustered Wound: Yes Photos Wound Measurements Length: (cm) 15 Width: (cm) 17 Depth: (cm) 0.1 Area: (cm) 200.277 Volume: (cm) 20.028 % Reduction in Area: -3884% % Reduction in Volume: -3881.7% Epithelialization: Medium (34-66%) Tunneling: No Undermining: No Wound Description Classification: Full Thickness Without Exposed Support Exudate Amount: Large Exudate Type: Serous Exudate Color: amber Structures Foul Odor After Cleansing: No Slough/Fibrino Yes Wound Bed Granulation Amount: Small (1-33%) Exposed Structure Granulation Quality: Red, Pink Fascia Exposed: No Necrotic Amount: Large (67-100%) Fat Layer (Subcutaneous Tissue) Exposed: Yes Necrotic Quality: Eschar, Adherent Slough Tendon Exposed: No Muscle Exposed: No Joint Exposed: No Bone Exposed: No [name], [name] ([id number]) [id number] Page 7 of 7 Periwound Skin Texture Texture Color No Abnormalities Noted: No No Abnormalities Noted: No Excoriation: Yes Erythema: Yes Erythema Measurement: Measured Moisture 15 cm No Abnormalities Noted: No Hemosiderin Staining: Yes Maceration: Yes Temperature / Pain Temperature: No Abnormality Electronic Signature(s) Signed: 05/06/2022 4:16:20 PM By: Clinton East RN Entered By: [name] on 05/01/2022 07:54:33 -------------------------------------------------------------------------------- Vitals Details Patient Name: Date of Service: Clinton Liner RD O. 05/01/2022 7:45 A M Medical Record Number: [id number] Patient Account Number: [id number] Date of Birth/Sex: Treating RN: Nov 28, 1955 (66 y.o. [name] Primary Care Dequavius Kuhner: [name] Other Clinician: Referring Harmoney Sienkiewicz: Treating Bani Gianfrancesco/Extender: [name] in Treatment: 9 Vital Signs Time Taken: 07:42 Temperature (F): 98.2 Height (in): 76 Pulse (bpm):  108 Weight (lbs): 322 Respiratory Rate (breaths/min): 20 Body Mass Index (BMI): 39.2 Blood Pressure (mmHg): 169/104 Reference Range: 80 - 120 mg / dl Electronic Signature(s) Signed: 05/06/2022 4:16:20 PM By: Clinton East RN Entered By: [name]  on 05/01/2022 07:43:28

## 2022-05-06 NOTE — Progress Notes (Signed)
[name], [name] ([id number]) [id number] Page 1 of 10 Visit Report for 05/01/2022 Chief Complaint Document Details Patient Name: Date of Service: Clinton Liner RD O. 05/01/2022 7:45 A M Medical Record Number: [id number] Patient Account Number: [id number] Date of Birth/Sex: Treating RN: 01/11/1956 (66 y.o. [name] Primary Care Provider: [name] Other Clinician: Referring Provider: Treating Provider/Extender: [name] in Treatment: 9 Information Obtained from: Patient Chief Complaint Patient presents for treatment of an open ulcer due to venous insufficiency Electronic Signature(s) Signed: 05/01/2022 8:23:00 AM By: Clinton Maudlin MD FACS Entered By: [name] on 05/01/2022 08:23:00 -------------------------------------------------------------------------------- Debridement Details Patient Name: Date of Service: Clinton Liner RD O. 05/01/2022 7:45 A M Medical Record Number: [id number] Patient Account Number: [id number] Date of Birth/Sex: Treating RN: 11/13/1955 (66 y.o. [name] Primary Care Provider: [name] Other Clinician: Referring Provider: Treating Provider/Extender: [name] in Treatment: 9 Debridement Performed for Assessment: Wound #1 Anterior Lower Leg Performed By: Physician Clinton Maudlin, MD Debridement Type: Debridement Severity of Tissue Pre Debridement: Fat layer exposed Level of Consciousness (Pre-procedure): Awake and Alert Pre-procedure Verification/Time Out Yes - 08:01 Taken: Start Time: 08:02 Pain Control: Lidocaine 5% topical ointment T Area Debrided (L x W): otal 15 (cm) x 17 (cm) = 255 (cm) Tissue and other material debrided: Non-Viable, Slough, Subcutaneous, Slough Level: Skin/Subcutaneous Tissue Debridement Description: Excisional Instrument: Curette Bleeding: Minimum Hemostasis Achieved: Pressure Response to  Treatment: Procedure was tolerated well Level of Consciousness (Post- Awake and Alert procedure): Post Debridement Measurements of Total Wound Length: (cm) 15 Width: (cm) 17 Depth: (cm) 0.1 Volume: (cm) 20.028 Character of Wound/Ulcer Post Debridement: Requires Further Debridement Severity of Tissue Post Debridement: Fat layer exposed Post Procedure Diagnosis Same as Pre-procedure [name], [name] ([id number]) [id number] Page 2 of 10 Notes Scribed for Dr. Celine [name] by Blanche East, RN Electronic Signature(s) Signed: 05/01/2022 8:32:36 AM By: Clinton Maudlin MD FACS Signed: 05/06/2022 4:16:20 PM By: Blanche East RN Entered By: Blanche East on 05/01/2022 08:04:20 -------------------------------------------------------------------------------- HPI Details Patient Name: Date of Service: Clinton Liner RD O. 05/01/2022 7:45 A M Medical Record Number: [id number] Patient Account Number: [id number] Date of Birth/Sex: Treating RN: February 08, 1956 (66 y.o. [name] Primary Care Provider: [name] Other Clinician: Referring Provider: Treating Provider/Extender: [name] in Treatment: 9 History of Present Illness HPI Description: ADMISSION 02/24/2022 This is a 66 year old man with a past medical history significant for congestive heart failure, morbid obesity, dilated cardiomyopathy, atrial fibrillation, and lower extremity cellulitis. He is currently being treated with Keflex. He is not diabetic. He does not smoke. He says that he developed some blisters on his left anterior tibial surface which subsequently broke open causing the wounds for which she is here to see [place] today. He says that he occasionally wears compression stockings but does not do so on a regular basis. He reports that "they look stupid with shorts." ABI in clinic today was 0.94. On the left anterior tibial surface, there are several small wounds in a  geographic pattern. They do appear consistent with blisters that have ruptured. The fat layer is exposed and there is a thin layer of slough on the surfaces. He does have some surrounding erythema, but no purulent drainage. Edema control is very poor. 03/03/2022: The patient came in for a nurse visit last week because he thought the compression wraps were too tight. They were reapplied and apparently he did not understand the instruction to  try to stay off his feet is much as possible and keep his leg elevated; he instead walked excessively on both Friday and Saturday. As result his leg became more swollen, the wrap became uncomfortable and he cut it off yesterday. He replaced it with a wrap of his own fashion. Today he has an indentation in his leg where his homemade wrap ended. He has extensive 3+ nonpitting edema above this. The wounds themselves are in fairly decent condition with just a little bit of slough on the surface, but edema fluid is frankly pouring out of the largest wound. 03/14/2022: Significant improvement in his wound. He has come in a couple of times for nurse visits due to drainage. Today the wound is quite a bit smaller and just has a layer of eschar and slough on the surface. Edema control is better. 03/23/2022: Ongoing improvement in his wound. His edema control is suboptimal today, however, because his wrap slid down. There is also evidence of excoriation suggesting that perhaps he was scratching under the wrap and it slid. There is a little bit of eschar and slough on the wound surface. 03/28/2022: The patient has been fairly noncompliant with his compression wraps. As result, he has opened a new area of wounding on his anterior tibial surface just proximal to the original site. It is limited breakdown of skin. The original site is smaller and has a little slough buildup. He did see his cardiologist this week and is now taking torsemide twice a day. 04/04/2022: The patient continues  to be fairly noncompliant with his care recommendations. He fails to show up for his venous reflux studies on Monday. He continues to cut his wraps off. Although he is supposed to be taking torsemide twice a day, the degree of edema in his legs suggest that perhaps he is not being compliant with his medication, as well. His blood pressure was elevated today and he said that he did not take his antihypertensives. He claims that the methocarbamol and tizanidine he was prescribed for muscle spasms are responsible for his leg swelling. He has developed a new wound on his right anterior tibial surface. This is fairly small and limited to breakdown of skin. There is a small amount of slough present. The wounds on his left lower leg do not look at all improved. There is slough accumulation on a couple of the deeper spots. He also has an area on his dorsal foot and anterior ankle that look like they are threatening to breakdown; this is where he had cut his wrap back to so that they were rubbing right in this location. 04/14/2022: The right leg has healed and there has been substantial improvement to the wounds on the left. 04/21/2022: The left leg has deteriorated considerably. It is massively edematous and erythematous. The wound is much larger and is pouring fluid. He says that he got it wet in the shower when his cast protector apparently leaked. He says that he had to walk half a mile this morning to his car, as it had been parked some distance away. 05/01/2022: Since our last visit, the patient was admitted to the hospital due to shortness of breath and was found to have severe urinary retention. A Foley catheter was placed and he was diuresed. He continues to have the indwelling catheter and has an appointment with urology later today. His wound has not really improved at all. His leg is quite edematous with 3+ pitting edema to the mid thigh. The wound  is erythematous and the skin is macerated. There is  some slough and nonviable subcutaneous tissue present on the wound surface. Electronic Signature(s) Signed: 05/01/2022 8:24:04 AM By: Clinton Maudlin MD FACS Entered By: [name] on 05/01/2022 08:24:04 [name] ([id number]) [id number] Page 3 of 10 -------------------------------------------------------------------------------- Physical Exam Details Patient Name: Date of Service: Clinton Liner RD O. 05/01/2022 7:45 A M Medical Record Number: [id number] Patient Account Number: [id number] Date of Birth/Sex: Treating RN: May 26, 1956 (66 y.o. [name] Primary Care Provider: [name] Other Clinician: Referring Provider: Treating Provider/Extender: [name] in Treatment: 9 Constitutional Hypertensive, asymptomatic. Slightly tachycardic, asymptomatic. . . No acute distress.. Cardiovascular . Notes 05/01/2022: His wound has not really improved at all. His leg is quite edematous with 3+ pitting edema to the mid thigh. The wound is erythematous and the skin is macerated. There is some slough and nonviable subcutaneous tissue present on the wound surface. Electronic Signature(s) Signed: 05/01/2022 8:25:04 AM By: Clinton Maudlin MD FACS Entered By: [name] on 05/01/2022 08:25:04 -------------------------------------------------------------------------------- Physician Orders Details Patient Name: Date of Service: Clinton Liner RD O. 05/01/2022 7:45 A M Medical Record Number: [id number] Patient Account Number: [id number] Date of Birth/Sex: Treating RN: 18-Apr-1956 (66 y.o. [name] Primary Care Provider: [name] Other Clinician: Referring Provider: Treating Provider/Extender: [name] in Treatment: 9 Verbal / Phone Orders: No Diagnosis Coding ICD-10 Coding Code Description R60.0 Localized edema N36.14 Chronic systolic (congestive) heart  failure I42.0 Dilated cardiomyopathy E66.01 Morbid (severe) obesity due to excess calories I10 Essential (primary) hypertension L97.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 other part of left lower leg limited to breakdown of skin Follow-up Appointments ppointment in 1 week. - Dr. Celine [name] Rm 1 Return A Friday 05/09/22 at 7:30 Nurse Visit: - Rm 4 Monday 05/05/22 at 7:30 Anesthetic Wound #1 Anterior Lower Leg (In clinic) Topical Lidocaine 5% applied to wound bed - in clinic, prior to debridement (In clinic) Topical Lidocaine 4% applied to wound bed - Used in clinic Bathing/ Shower/ Hygiene May shower with protection but do not get wound dressing(s) wet. - use cast protector on Left lower leg Edema Control - Lymphedema / SCD / Other [name], [name] ([id number]) [id number] Page 4 of 10 Avoid standing for long periods of time. Patient to wear own compression stockings every day. - wear compression stockings Right leg Wound Treatment Wound #1 - Lower Leg Wound Laterality: Anterior Cleanser: Soap and Water 1 x Per Week/30 Days Discharge Instructions: May shower and wash wound with dial antibacterial soap and water prior to dressing change. Peri-Wound Care: Sween Lotion (Moisturizing lotion) 1 x Per Week/30 Days Discharge Instructions: Apply moisturizing lotion as directed Topical: Mupirocin Ointment 1 x Per Week/30 Days Discharge Instructions: Apply Mupirocin (Bactroban) as instructed Topical: Triamcinolone 1 x Per Week/30 Days Discharge Instructions: Apply Triamcinolone as directed Prim Dressing: KerraCel Ag Gelling Fiber Dressing, 4x5 in (silver alginate) 1 x Per Week/30 Days ary Discharge Instructions: Apply silver alginate to wound bed as instructed Secondary Dressing: Zetuvit Plus Silicone Border Dressing 4x4 (in/in) 1 x Per Week/30 Days Discharge Instructions: Apply silicone border over primary dressing as directed. Secured With: 94M Medipore Public affairs consultant  Surgical T 2x10 (in/yd) 1 x Per Week/30 Days ape Discharge Instructions: Secure with tape as directed. Compression Wrap: ThreePress (3 layer compression wrap) 1 x Per Week/30 Days Discharge Instructions: Apply three layer compression as directed. Electronic Signature(s) Signed: 05/01/2022 8:32:36 AM By: Clinton Maudlin MD FACS Entered By:  [name] on 05/01/2022 08:25:17 -------------------------------------------------------------------------------- Problem List Details Patient Name: Date of Service: [facility] RD O. 05/01/2022 7:45 A M Medical Record Number: [id number] Patient Account Number: [id number] Date of Birth/Sex: Treating RN: 1956-02-19 (66 y.o. [name] Primary Care Provider: [name] Other Clinician: Referring Provider: Treating Provider/Extender: [name] in Treatment: 9 Active Problems ICD-10 Encounter Code Description Active Date MDM Diagnosis R60.0 Localized edema 02/24/2022 No Yes E31.54 Chronic systolic (congestive) heart failure 02/24/2022 No Yes I42.0 Dilated cardiomyopathy 02/24/2022 No Yes E66.01 Morbid (severe) obesity due to excess calories 02/24/2022 No Yes I10 Essential (primary) hypertension 02/24/2022 No Yes [name], [name] ([id number]) [id number] Page 5 of 10 [id number] Non-pressure chronic ulcer of other part of left lower leg limited to breakdown 03/28/2022 No Yes of skin Inactive Problems ICD-10 Code Description Active Date Inactive Date 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d 02/24/2022 02/24/2022 L97.811 Non-pressure chronic ulcer of other part of right lower leg limited to breakdown of skin 04/04/2022 04/04/2022 Resolved Problems Electronic Signature(s) Signed: 05/01/2022 8:22:49 AM By: Clinton Maudlin MD FACS Entered By: [name] on 05/01/2022  08:22:49 -------------------------------------------------------------------------------- Progress Note Details Patient Name: Date of Service: Clinton Liner RD O. 05/01/2022 7:45 A M Medical Record Number: [id number] Patient Account Number: [id number] Date of Birth/Sex: Treating RN: 04-Jan-1956 (66 y.o. [name] Primary Care Provider: [name] Other Clinician: Referring Provider: Treating Provider/Extender: [name] in Treatment: 9 Subjective Chief Complaint Information obtained from Patient Patient presents for treatment of an open ulcer due to venous insufficiency History of Present Illness (HPI) ADMISSION 02/24/2022 This is a 66 year old man with a past medical history significant for congestive heart failure, morbid obesity, dilated cardiomyopathy, atrial fibrillation, and lower extremity cellulitis. He is currently being treated with Keflex. He is not diabetic. He does not smoke. He says that he developed some blisters on his left anterior tibial surface which subsequently broke open causing the wounds for which she is here to see [place] today. He says that he occasionally wears compression stockings but does not do so on a regular basis. He reports that "they look stupid with shorts." ABI in clinic today was 0.94. On the left anterior tibial surface, there are several small wounds in a geographic pattern. They do appear consistent with blisters that have ruptured. The fat layer is exposed and there is a thin layer of slough on the surfaces. He does have some surrounding erythema, but no purulent drainage. Edema control is very poor. 03/03/2022: The patient came in for a nurse visit last week because he thought the compression wraps were too tight. They were reapplied and apparently he did not understand the instruction to try to stay off his feet is much as possible and keep his leg elevated; he instead walked excessively on both Friday  and Saturday. As result his leg became more swollen, the wrap became uncomfortable and he cut it off yesterday. He replaced it with a wrap of his own fashion. Today he has an indentation in his leg where his homemade wrap ended. He has extensive 3+ nonpitting edema above this. The wounds themselves are in fairly decent condition with just a little bit of slough on the surface, but edema fluid is frankly pouring out of the largest wound. 03/14/2022: Significant improvement in his wound. He has come in a couple of times for nurse visits due to drainage. Today the wound is  quite a bit smaller and just has a layer of eschar and slough on the surface. Edema control is better. 03/23/2022: Ongoing improvement in his wound. His edema control is suboptimal today, however, because his wrap slid down. There is also evidence of excoriation suggesting that perhaps he was scratching under the wrap and it slid. There is a little bit of eschar and slough on the wound surface. 03/28/2022: The patient has been fairly noncompliant with his compression wraps. As result, he has opened a new area of wounding on his anterior tibial surface just proximal to the original site. It is limited breakdown of skin. The original site is smaller and has a little slough buildup. He did see his cardiologist this week and is now taking torsemide twice a day. 04/04/2022: The patient continues to be fairly noncompliant with his care recommendations. He fails to show up for his venous reflux studies on Monday. He continues to cut his wraps off. Although he is supposed to be taking torsemide twice a day, the degree of edema in his legs suggest that perhaps he is not being compliant with his medication, as well. His blood pressure was elevated today and he said that he did not take his antihypertensives. He claims that the methocarbamol and tizanidine he was prescribed for muscle spasms are responsible for his leg swelling. He has developed a new  wound on his right anterior tibial surface. This is fairly small and limited to breakdown of skin. There is a small amount of slough present. The wounds on his left lower leg do not look at all [name], [name] ([id number]) [id number] Page 6 of 10 improved. There is slough accumulation on a couple of the deeper spots. He also has an area on his dorsal foot and anterior ankle that look like they are threatening to breakdown; this is where he had cut his wrap back to so that they were rubbing right in this location. 04/14/2022: The right leg has healed and there has been substantial improvement to the wounds on the left. 04/21/2022: The left leg has deteriorated considerably. It is massively edematous and erythematous. The wound is much larger and is pouring fluid. He says that he got it wet in the shower when his cast protector apparently leaked. He says that he had to walk half a mile this morning to his car, as it had been parked some distance away. 05/01/2022: Since our last visit, the patient was admitted to the hospital due to shortness of breath and was found to have severe urinary retention. A Foley catheter was placed and he was diuresed. He continues to have the indwelling catheter and has an appointment with urology later today. His wound has not really improved at all. His leg is quite edematous with 3+ pitting edema to the mid thigh. The wound is erythematous and the skin is macerated. There is some slough and nonviable subcutaneous tissue present on the wound surface. Patient History Information obtained from Patient. Family History Cancer - Father,Mother, Hypertension - Mother,Father, No family history of Diabetes, Heart Disease, Hereditary Spherocytosis, Kidney Disease, Lung Disease, Seizures, Thyroid Problems, Tuberculosis. Social History Never smoker, Marital Status - Married, Alcohol Use - Never, Drug Use - No History, Caffeine Use - Daily - [employer]  dew. Medical History Eyes Patient has history of Cataracts - 2021 Ear/Nose/Mouth/Throat Denies history of Chronic sinus problems/congestion, Middle ear problems Hematologic/Lymphatic Patient has history of Lymphedema Respiratory Patient has history of Sleep Apnea Cardiovascular Patient has history of Congestive Heart  Failure, Coronary Artery Disease, Hypertension Denies history of Deep Vein Thrombosis Gastrointestinal Denies history of Cirrhosis , Colitis, Crohnoos Endocrine Denies history of Type I Diabetes, Type II Diabetes Genitourinary Denies history of End Stage Renal Disease Immunological Denies history of Raynaudoos, Scleroderma Integumentary (Skin) Denies history of History of Burn Musculoskeletal Denies history of Gout, Rheumatoid Arthritis, Osteoarthritis, Osteomyelitis Neurologic Denies history of Dementia, Neuropathy, Quadriplegia, Paraplegia, Seizure Disorder Oncologic Denies history of Received Chemotherapy, Received Radiation Psychiatric Patient has history of Confinement Anxiety Denies history of Anorexia/bulimia Hospitalization/Surgery History - Inguinal Hernia repair- 2014. - insertion of mesh-2014. - Atrial ablation surgery- 2007. - BIla eyes lasik- 2008. - hernia repair- 1977. Medical A Surgical History Notes nd Respiratory Allergic Rhinitis Cardiovascular AFIB Objective Constitutional Hypertensive, asymptomatic. Slightly tachycardic, asymptomatic. No acute distress.. Vitals Time Taken: 7:42 AM, Height: 76 in, Weight: 322 lbs, BMI: 39.2, Temperature: 98.2 F, Pulse: 108 bpm, Respiratory Rate: 20 breaths/min, Blood Pressure: 169/104 mmHg. General Notes: 05/01/2022: His wound has not really improved at all. His leg is quite edematous with 3+ pitting edema to the mid thigh. The wound is erythematous and the skin is macerated. There is some slough and nonviable subcutaneous tissue present on the wound surface. Integumentary (Hair, Skin) Wound #1  status is Open. Original cause of wound was Blister. The date acquired was: 09/25/2021. The wound has been in treatment 9 weeks. The wound is [name], [name] ([id number]) [id number] Page 7 of 10 located on the Anterior Lower Leg. The wound measures 15cm length x 17cm width x 0.1cm depth; 200.277cm^2 area and 20.028cm^3 volume. There is Fat Layer (Subcutaneous Tissue) exposed. There is no tunneling or undermining noted. There is a large amount of serous drainage noted. There is small (1-33%) red, pink granulation within the wound bed. There is a large (67-100%) amount of necrotic tissue within the wound bed including Eschar and Adherent Slough. The periwound skin appearance exhibited: Excoriation, Maceration, Hemosiderin Staining, Erythema. The surrounding wound skin color is noted with erythema. Erythema is measured at 15 cm. Periwound temperature was noted as No Abnormality. Assessment Active Problems ICD-10 Localized edema Chronic systolic (congestive) heart failure Dilated cardiomyopathy Morbid (severe) obesity due to excess calories Essential (primary) hypertension Non-pressure chronic ulcer of other part of left lower leg limited to breakdown of skin Procedures Wound #1 Pre-procedure diagnosis of Wound #1 is a Venous Leg Ulcer located on the Anterior Lower Leg .Severity of Tissue Pre Debridement is: Fat layer exposed. There was a Excisional Skin/Subcutaneous Tissue Debridement with a total area of 255 sq cm performed by Clinton Maudlin, MD. With the following instrument(s): Curette to remove Non-Viable tissue/material. Material removed includes Subcutaneous Tissue and Slough and after achieving pain control using Lidocaine 5% topical ointment. A time out was conducted at 08:01, prior to the start of the procedure. A Minimum amount of bleeding was controlled with Pressure. The procedure was tolerated well. Post Debridement Measurements: 15cm length x 17cm  width x 0.1cm depth; 20.028cm^3 volume. Character of Wound/Ulcer Post Debridement requires further debridement. Severity of Tissue Post Debridement is: Fat layer exposed. Post procedure Diagnosis Wound #1: Same as Pre-Procedure General Notes: Scribed for Dr. Celine [name] by Blanche East, RN. Pre-procedure diagnosis of Wound #1 is a Venous Leg Ulcer located on the Anterior Lower Leg . There was a Three Layer Compression Therapy Procedure by Blanche East, RN. Post procedure Diagnosis Wound #1: Same as Pre-Procedure Plan Follow-up Appointments: Return Appointment in 1 week. - Dr. Celine [name] Rm 1 Friday 05/09/22 at 7:30 Nurse Visit: -  Rm 4 Monday 05/05/22 at 7:30 Anesthetic: Wound #1 Anterior Lower Leg: (In clinic) Topical Lidocaine 5% applied to wound bed - in clinic, prior to debridement (In clinic) Topical Lidocaine 4% applied to wound bed - Used in clinic Bathing/ Shower/ Hygiene: May shower with protection but do not get wound dressing(s) wet. - use cast protector on Left lower leg Edema Control - Lymphedema / SCD / Other: Avoid standing for long periods of time. Patient to wear own compression stockings every day. - wear compression stockings Right leg WOUND #1: - Lower Leg Wound Laterality: Anterior Cleanser: Soap and Water 1 x Per Week/30 Days Discharge Instructions: May shower and wash wound with dial antibacterial soap and water prior to dressing change. Peri-Wound Care: Sween Lotion (Moisturizing lotion) 1 x Per Week/30 Days Discharge Instructions: Apply moisturizing lotion as directed Topical: Mupirocin Ointment 1 x Per Week/30 Days Discharge Instructions: Apply Mupirocin (Bactroban) as instructed Topical: Triamcinolone 1 x Per Week/30 Days Discharge Instructions: Apply Triamcinolone as directed Prim Dressing: KerraCel Ag Gelling Fiber Dressing, 4x5 in (silver alginate) 1 x Per Week/30 Days ary Discharge Instructions: Apply silver alginate to wound bed as instructed Secondary Dressing:  Zetuvit Plus Silicone Border Dressing 4x4 (in/in) 1 x Per Week/30 Days Discharge Instructions: Apply silicone border over primary dressing as directed. Secured With: 81M Medipore Public affairs consultant Surgical T 2x10 (in/yd) 1 x Per Week/30 Days ape Discharge Instructions: Secure with tape as directed. Com pression Wrap: ThreePress (3 layer compression wrap) 1 x Per Week/30 Days Discharge Instructions: Apply three layer compression as directed. 05/01/2022: His wound has not really improved at all. His leg is quite edematous with 3+ pitting edema to the mid thigh. The wound is erythematous and the skin is macerated. There is some slough and nonviable subcutaneous tissue present on the wound surface. I used a curette to debride slough and nonviable subcutaneous tissue from the wound. We will continue topical mupirocin with multiple absorptive layers and a 3 layer compression wrap. We also recommended that he look into an alternative leg strap for his Foley catheter and move it to the less swollen leg. They will [name], [name] ([id number]) [id number] Page 8 of 10 address this with urology later today. Follow-up here in 1 week. Electronic Signature(s) Signed: 05/01/2022 8:25:53 AM By: Clinton Maudlin MD FACS Entered By: [name] on 05/01/2022 08:25:53 -------------------------------------------------------------------------------- HxROS Details Patient Name: Date of Service: Clinton Liner RD O. 05/01/2022 7:45 A M Medical Record Number: [id number] Patient Account Number: [id number] Date of Birth/Sex: Treating RN: 1955/12/04 (66 y.o. [name] Primary Care Provider: [name] Other Clinician: Referring Provider: Treating Provider/Extender: [name] in Treatment: 9 Information Obtained From Patient Eyes Medical History: Positive for: Cataracts - 2021 Ear/Nose/Mouth/Throat Medical History: Negative for: Chronic  sinus problems/congestion; Middle ear problems Hematologic/Lymphatic Medical History: Positive for: Lymphedema Respiratory Medical History: Positive for: Sleep Apnea Past Medical History Notes: Allergic Rhinitis Cardiovascular Medical History: Positive for: Congestive Heart Failure; Coronary Artery Disease; Hypertension Negative for: Deep Vein Thrombosis Past Medical History Notes: AFIB Gastrointestinal Medical History: Negative for: Cirrhosis ; Colitis; Crohns Endocrine Medical History: Negative for: Type I Diabetes; Type II Diabetes Genitourinary Medical History: Negative for: End Stage Renal Disease Immunological Medical History: Negative for: Epifania Gore Scleroderma ANSELMO, REIHL ([id number]) [id number] Page 9 of 10 Integumentary (Skin) Medical History: Negative for: History of Burn Musculoskeletal Medical History: Negative for: Gout; Rheumatoid Arthritis; Osteoarthritis; Osteomyelitis Neurologic Medical History: Negative for: Dementia; Neuropathy; Quadriplegia; Paraplegia; Seizure Disorder Oncologic  Medical History: Negative for: Received Chemotherapy; Received Radiation Psychiatric Medical History: Positive for: Confinement Anxiety Negative for: Anorexia/bulimia HBO Extended History Items Eyes: Cataracts Immunizations Pneumococcal Vaccine: Received Pneumococcal Vaccination: Yes Received Pneumococcal Vaccination On or After 60th Birthday: Yes Implantable Devices None Hospitalization / Surgery History Type of Hospitalization/Surgery Inguinal Hernia repair- 2014 insertion of mesh-2014 Atrial ablation surgery- 2007 BIla eyes lasik- 2008 hernia repair- 1977 Family and Social History Cancer: Yes - Father,Mother; Diabetes: No; Heart Disease: No; Hereditary Spherocytosis: No; Hypertension: Yes - Mother,Father; Kidney Disease: No; Lung Disease: No; Seizures: No; Thyroid Problems: No; Tuberculosis: No; Never smoker; Marital  Status - Married; Alcohol Use: Never; Drug Use: No History; Caffeine Use: Daily - 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 05/01/2022 Medical Record Number: [id number] Patient Account Number: [id number] Date of Birth/Sex: Treating RN: [birth year] (66 y.o. [name] Primary Care Provider: [name] Other Clinician: Referring Provider: Treating Provider/Extender: [name] in Treatment: [address] ABDOU, STOCKS ([id number]) [id number] Page 10 of 10 ICD-10 Codes Code Description R60.0 Localized edema X10.62 Chronic systolic (congestive) heart failure I42.0 Dilated cardiomyopathy E66.01 Morbid (severe) obesity due to excess calories I10 Essential (primary) hypertension L97.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 other part of left lower leg limited to breakdown of skin Facility Procedures : CPT4 Code: [id number] Description: 70350 - DEB SUBQ TISSUE 20 SQ CM/< ICD-10 Diagnosis Description L97.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 other part of left lower leg limited to breakdown Modifier: of skin Quantity: 1 : CPT4 Code: [id number] Description: 93716 - DEB SUBQ TISS EA ADDL 20CM ICD-10 Diagnosis Description L97.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 other part of left lower leg limited to breakdown Modifier: of skin Quantity: 12 Physician Procedures : CPT4 Code Description Modifier [id number] 10175 - WC PHYS LEVEL 4 - EST PT 25 ICD-10 Diagnosis Description L97.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 other part of left lower leg limited to  breakdown of skin Z02.58 Chronic systolic (congestive) heart failure  E66.01 Morbid (severe) obesity due to excess calories R60.0 Localized edema Quantity: 1 : 5277824 11042 - WC PHYS SUBQ TISS 20 SQ CM ICD-10 Diagnosis Description L97.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 other part of left lower leg limited to breakdown of skin Quantity: 1 : 2353614 11045 - WC PHYS SUBQ TISS EA ADDL 20 CM ICD-10 Diagnosis Description L97.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 other part of left lower leg limited to breakdown of skin Quantity: 12 Electronic Signature(s) Signed: 05/01/2022 8:26:13 AM By: Clinton Maudlin MD FACS Entered By: [name] on 05/01/2022 08:26:13

## 2022-05-09 ENCOUNTER — Ambulatory Visit (HOSPITAL_BASED_OUTPATIENT_CLINIC_OR_DEPARTMENT_OTHER): Payer: 59 | Admitting: General Surgery

## 2022-05-14 ENCOUNTER — Other Ambulatory Visit: Payer: Self-pay | Admitting: *Deleted

## 2022-05-14 DIAGNOSIS — I872 Venous insufficiency (chronic) (peripheral): Secondary | ICD-10-CM

## 2022-05-15 ENCOUNTER — Encounter (HOSPITAL_BASED_OUTPATIENT_CLINIC_OR_DEPARTMENT_OTHER): Payer: 59 | Attending: General Surgery | Admitting: General Surgery

## 2022-05-19 ENCOUNTER — Ambulatory Visit (HOSPITAL_COMMUNITY)
Admission: RE | Admit: 2022-05-19 | Discharge: 2022-05-19 | Disposition: A | Discharge status: Home / Self Care | Payer: 59 | Source: Ambulatory Visit | Attending: Surgery | Admitting: Surgery

## 2022-05-19 ENCOUNTER — Ambulatory Visit: Payer: 59 | Admitting: Physician Assistant

## 2022-05-19 VITALS — BP 134/89 | HR 93 | Temp 98.0°F | Resp 16 | Ht 76.0 in | Wt 326.0 lb

## 2022-05-19 DIAGNOSIS — I872 Venous insufficiency (chronic) (peripheral): Secondary | ICD-10-CM | POA: Diagnosis present

## 2022-05-19 NOTE — Progress Notes (Signed)
[facility]   Reason for referral: Swollen B legs with anterior leg wounds and weeping  History of Present Illness  [name] is a 66 y.o. male who presents with chief complaint: swollen leg.  Patient notes, onset of swelling 12 months ago, associated with prolonged sitting and inactivity.  The patient has had no history of DVT, no history of varicose vein, positive history of venous stasis ulcers, no history of  Lymphedema and positive history of skin changes in lower legs.  There is unknown family history of venous disorders.  The patient has not used compression stockings in the past.  He states he has had edema and wounds for the past year.  He has been seen by the wound care center and was placed in una boots.  He states he can not tolerate them and cuts them off once home.  He also states that he tries to elevate his legs and they start to jump and his thighs ache.  He and his wife decided to wrap the wound at home with some success.  He has constant weeping of clear fluid from B LE left > right.    He is not very active and was told by some else in his treatment past that he should not walk or exercise at all.  He doesn't know what to do.    He has past medical history of Dilated cardiomyopathy, A fib, mitral regurgitation, obesity, Acute systolic CHF and CKD.  He has been on diuretics in the past to assist with edema.  He is managed on Eliquis for A fib, ASA.  Past Medical History:  Diagnosis Date   Allergic rhinitis    Anxiety    Asthma    as a child   Chronic low back pain    Complication of anesthesia    "hard to put asleep, hard to wake up, nausea and vomiting"   Depression    ED (erectile dysfunction)    GERD (gastroesophageal reflux disease)    HLD (hyperlipidemia)    HTN (hypertension)    sees Dr. [name], [facility]   Hypertrophy of prostate with urinary obstruction and other lower urinary tract symptoms (LUTS)    IBS  (irritable bowel syndrome)    Inguinal hernia without mention of obstruction or gangrene, unilateral or unspecified, (not specified as recurrent)    Insomnia    Lumbar herniated disc    L7   Morbid obesity ([place])    Narcotic addiction ([place])    NICM (nonischemic cardiomyopathy) ([place])    tachycardia mediated,  resolved with sinus   Persistent atrial fibrillation ([place])    ablation done 03/2006 at [facility]   Pneumonia    hx of 2004   PONV (postoperative nausea and vomiting)    Twitching    legs    Past Surgical History:  Procedure Laterality Date   ATRIAL ABLATION SURGERY  2007   [facility]   CARDIOVERSION  08/11/2011   Procedure: CARDIOVERSION;  Surgeon: Lelon Perla, MD;  Location: [place];  Service: Cardiovascular;  Laterality: N/A;   CARDIOVERSION N/A 04/16/2017   Procedure: CARDIOVERSION;  Surgeon: Sanda Klein, MD;  Location: [facility] ENDOSCOPY;  Service: Cardiovascular;  Laterality: N/A;   CARDIOVERSION N/A 03/04/2019   Procedure: CARDIOVERSION;  Surgeon: Lelon Perla, MD;  Location: [facility] ENDOSCOPY;  Service: Cardiovascular;  Laterality: N/A;   CARDIOVERSION N/A 07/04/2019   Procedure: CARDIOVERSION;  Surgeon: [name], MD;  Location: [facility] ENDOSCOPY;  Service: Cardiovascular;  Laterality: N/A;   EYE SURGERY     lasik, 1998   HERNIA REPAIR     1977   INGUINAL HERNIA REPAIR Right 08/26/2012   Procedure: LAPAROSCOPIC INGUINAL HERNIA;  Surgeon: Madilyn Hook, DO;  Location: [place];  Service: General;  Laterality: Right;  laparoscopic right inguinal hernia repair with mesh, umbilical hernia repair   INSERTION OF MESH Right 08/26/2012   Procedure: INSERTION OF MESH;  Surgeon: Madilyn Hook, DO;  Location: [place];  Service: General;  Laterality: Right;  right inguinal hernia   TEE WITHOUT CARDIOVERSION N/A 03/04/2019   Procedure: TRANSESOPHAGEAL ECHOCARDIOGRAM (TEE);  Surgeon: Lelon Perla, MD;  Location: [place];  Service: Cardiovascular;  Laterality: N/A;   UMBILICAL HERNIA REPAIR  N/A 08/26/2012   Procedure: HERNIA REPAIR UMBILICAL ADULT;  Surgeon: Madilyn Hook, DO;  Location: [facility];  Service: General;  Laterality: N/A;    Social History   Socioeconomic History   Marital status: Married    Spouse name: Azarel Banner   Number of children: 3   Years of education: Not on file   Highest education level: Bachelor's degree (e.g., BA, AB, BS)  Occupational History   Occupation: retired  Tobacco Use   Smoking status: Never   Smokeless tobacco: Never  Vaping Use   Vaping Use: Never used  Substance and Sexual Activity   Alcohol use: No    Alcohol/week: 0.0 standard drinks of alcohol   Drug use: No   Sexual activity: Yes  Other Topics Concern   Not on file  Social History Narrative   Lives in [place] Strain: Low Risk  (04/17/2021)   Overall Financial Resource Strain (CARDIA)    Difficulty of Paying Living Expenses: Not hard at all  Food Insecurity: No Food Insecurity (04/25/2022)   Hunger Vital Sign    Worried About Running Out of Food in the Last Year: Never true    Ran Out of Food in the Last Year: Never true  Transportation Needs: No Transportation Needs (04/25/2022)   PRAPARE - Hydrologist (Medical): No    Lack of Transportation (Non-Medical): No  Physical Activity: Not on file  Stress: Not on file  Social Connections: Not on file  Intimate Partner Violence: Not At Risk (04/25/2022)   Humiliation, Afraid, Rape, and Kick questionnaire    Fear of Current or Ex-Partner: No    Emotionally Abused: No    Physically Abused: No    Sexually Abused: No    Family History  Problem Relation Age of Onset   Asthma Mother    Breast cancer Mother    Hypertension Mother    COPD Father    Prostate cancer Father    Hypertension Father    Lymphoma Sister     Current Outpatient Medications on File Prior to Visit  Medication Sig Dispense Refill   aspirin EC 81 MG tablet  Take 81 mg by mouth daily as needed (for chest pain). Swallow whole. (Patient not taking: Reported on 05/19/2022)     carvedilol (COREG) 12.5 MG tablet Take 1 tablet (12.5 mg total) by mouth 2 (two) times daily with a meal. 60 tablet 2   diltiazem (CARDIZEM CD) 360 MG 24 hr capsule Take 1 capsule (360 mg total) by mouth daily. Please keep scheduled appointment for additional refills. (Patient taking differently: Take 360 mg by mouth daily.) 90 capsule 0   DULoxetine (CYMBALTA) 60 MG  capsule Take 60 mg by mouth 2 (two) times daily.     ELIQUIS 5 MG TABS tablet TAKE 1 TABLET(5 MG) BY MOUTH TWICE DAILY (Patient taking differently: Take 5 mg by mouth 2 (two) times daily.) 60 tablet 11   finasteride (PROSCAR) 5 MG tablet Take 1 tablet (5 mg total) by mouth daily. 30 tablet 0   gabapentin (NEURONTIN) 100 MG capsule Take 1 capsule (100 mg total) by mouth 3 (three) times daily. (Patient not taking: Reported on 05/19/2022) 90 capsule 0   LORazepam (ATIVAN) 0.5 MG tablet Take 0.5 mg by mouth every 8 (eight) hours as needed for anxiety. (Patient not taking: Reported on 05/19/2022)     montelukast (SINGULAIR) 10 MG tablet Take 10 mg by mouth at bedtime as needed (for seasonal allergies). (Patient not taking: Reported on 05/19/2022)     omeprazole (PRILOSEC) 20 MG capsule Take 20 mg by mouth daily as needed (for acid reflux).     rOPINIRole (REQUIP) 1 MG tablet Take 1-2 tablets (1-2 mg total) by mouth at bedtime as needed (restless leg syndrome). 1 tablet after lunch and 2 tablet at bedtime (Patient taking differently: Take 3 mg by mouth at bedtime.) 120 tablet 0   tadalafil (CIALIS) 5 MG tablet Take 5 mg by mouth daily as needed (as directed). (Patient not taking: Reported on 05/19/2022)     tamsulosin (FLOMAX) 0.4 MG CAPS capsule Take 1 capsule (0.4 mg total) by mouth daily after supper. 30 capsule 1   tiZANidine (ZANAFLEX) 4 MG tablet Take 4-8 mg by mouth 3 (three) times daily as needed for muscle spasms.  (Patient not taking: Reported on 05/19/2022)     No current facility-administered medications on file prior to visit.    Allergies as of 05/19/2022 - Review Complete 05/19/2022  Allergen Reaction Noted   Other Other (See Comments) 04/24/2022   Hydrocodone Other (See Comments) 03/24/2019     ROS:   General:  No weight loss, Fever, chills  HEENT: No recent headaches, no nasal bleeding, no visual changes, no sore throat  Neurologic: No dizziness, blackouts, seizures. No recent symptoms of stroke or mini- stroke. No recent episodes of slurred speech, or temporary blindness.  Cardiac: No recent episodes of chest pain/pressure, no shortness of breath at rest.  No shortness of breath with exertion.  Positive history of atrial fibrillation or irregular heartbeat  Vascular: No history of rest pain in feet.  No history of claudication.  Positive history of non-healing ulcer, No history of DVT   Pulmonary: No home oxygen, no productive cough, no hemoptysis,  No asthma or wheezing  Musculoskeletal:  '[ ]'$  Arthritis, '[ ]'$  Low back pain,  '[ ]'$  Joint pain  Hematologic:No history of hypercoagulable state.  No history of easy bleeding.  No history of anemia  Gastrointestinal: No hematochezia or melena,  No gastroesophageal reflux, no trouble swallowing  Urinary: [ x] chronic Kidney disease, '[ ]'$  on HD - '[ ]'$  MWF or '[ ]'$  TTHS, '[ ]'$  Burning with urination, '[ ]'$  Frequent urination, '[ ]'$  Difficulty urinating;   Skin: No rashes  Psychological: No history of anxiety,  No history of depression  Physical Examination  Vitals:   05/19/22 1405  BP: 134/89  Pulse: 93  Resp: 16  Temp: 98 F (36.7 C)  TempSrc: Temporal  SpO2: 97%  Weight: (!) 326 lb (147.9 kg)  Height: '6\' 4"'$  (1.93 m)    Body mass index is 39.68 kg/m.  General:  Alert and oriented, no acute  distress HEENT: Normal Neck: No bruit or JVD Pulmonary: Clear to auscultation bilaterally Cardiac: Irregularly Irregular without  murmur Abdomen: Soft, non-tender, non-distended, no mass, no scars Skin: No rash     Extremity Pulses:   radial,  femoral, dorsalis pedis,pulses bilaterally Musculoskeletal: positive B LE edema  Neurologic: Upper and lower extremity motor 5/5 and symmetric  DATA: LEFT          Reflux NoRefluxReflux TimeDiameter cmsComments                                  Yes                                            +--------------+---------+------+-----------+------------+----------------+   CFV                    yes   >1 second                                +--------------+---------+------+-----------+------------+----------------+   FV prox       no                                                       +--------------+---------+------+-----------+------------+----------------+   FV mid        no                                                       +--------------+---------+------+-----------+------------+----------------+   FV dist       no                                                       +--------------+---------+------+-----------+------------+----------------+   Popliteal    no                                                       +--------------+---------+------+-----------+------------+----------------+   GSV at SFJ              yes    >500 ms     0.976                       +--------------+---------+------+-----------+------------+----------------+   GSV prox thighno                           0.621                       +--------------+---------+------+-----------+------------+----------------+   GSV mid thigh           yes    >500  ms     0.705                       +--------------+---------+------+-----------+------------+----------------+   GSV dist thighno                            0.73                       +--------------+---------+------+-----------+------------+----------------+    GSV at knee   no                           0.638                       +--------------+---------+------+-----------+------------+----------------+   GSV prox calf           yes    >500 ms     0.577                       +--------------+---------+------+-----------+------------+----------------+   SSV Pop Fossa no                           0.288                       +--------------+---------+------+-----------+------------+----------------+   SSV prox calf no                           0.358                       +--------------+---------+------+-----------+------------+----------------+   SSV mid calf  no                            0.37    chronic  thrombus  +--------------+---------+------+-----------+------------+----------------+       Summary:  Left:  Thrombus  - No evidence of deep vein thrombosis seen in the left lower extremity,  from the common femoral through the popliteal veins.  - No evidence of superficial venous thrombosis in the left lower  extremity.    Deep veins  - Deep vein reflux in the CFV.   Superficial veins  - Superficial vein reflux in the SFJ, GSV mid thigh and prox calf.     Assessment/Plan: Venous reflux with non healing venous wounds B anterior lower legs. Weeping fluid from skin and edema No DVT.  I explained that we recommend una boots as well with elevation.  Exercise as tolerates.  He states he will try exercise and elevation.  He wants to continue with home wound care instead of trying the una boots again.  He can't be placed in compression garments until the wounds are healed.    I gave him a copy of the venous reflux guide.  He will f/u in our vein clinic in 3 months.  We will duplex the right LE at the f/u visit.  Hopefully he ha success with increased activity and elevation.      Roxy Horseman PA-C [facility] [place] [facility] Office: [phone]  MD in clinic  [place]

## 2022-05-26 ENCOUNTER — Other Ambulatory Visit: Payer: Self-pay

## 2022-05-26 DIAGNOSIS — I872 Venous insufficiency (chronic) (peripheral): Secondary | ICD-10-CM

## 2022-06-16 NOTE — Progress Notes (Deleted)
HPI: FU atrial fibrillation. The patient had a cardiac catheterization in 2004 that showed an ejection fraction of 45% and normal coronary arteries. He did have atrial fibrillation at that time and his LV function improved after sinus rhythm was restored. He ultimately had atrial fibrillation ablation. He did well for several years but then recurrent atrial fibrillation. Transesophageal echocardiogram August 2020 showed normal LV function, severe left atrial enlargement, moderate to severe mitral regurgitation and mild tricuspid regurgitation. Patient had cardioversion March 04, 2019 but atrial fibrillation recurred. Patient seen by Dr. Rayann Heman and repeat ablation felt not indicated as chances of maintaining sinus would be lower given obesity and severe left atrial enlargement.  Plan was to potentially consider referral for surgical Maze procedure and mitral valve repair. Follow-up transthoracic echocardiogram October 2020 showed normal LV function, moderate left atrial enlargement and trace mitral regurgitation. Patient subsequently placed on amiodarone in the atrial fibrillation clinic. Also referred for evaluation of sleep apnea. Repeat attempt at cardioversion December 2020 unsuccessful. Dr. Roxy Manns evaluated patient for consideration of surgical maze procedure and felt that medical therapy was best option. Most recent echocardiogram October 2023 showed normal LV function, mild left ventricular hypertrophy, mild left atrial enlargement, mild mitral regurgitation.  Lower extremity venous Dopplers November 2023 showed no left lower extremity DVT.  Patient admitted October 2023 with worsening lower extremity edema and dyspnea.  He was volume overloaded but was also felt to have acute urinary retention with associated acute kidney injury (initial creatinine 4.98 and BUN 97).  He was treated for lower extremity cellulitis as well.  He improved with placing Foley catheter.  Was to follow-up with urology as an  outpatient.  Since last seen,   Current Outpatient Medications  Medication Sig Dispense Refill   aspirin EC 81 MG tablet Take 81 mg by mouth daily as needed (for chest pain). Swallow whole. (Patient not taking: Reported on 05/19/2022)     carvedilol (COREG) 12.5 MG tablet Take 1 tablet (12.5 mg total) by mouth 2 (two) times daily with a meal. 60 tablet 2   diltiazem (CARDIZEM CD) 360 MG 24 hr capsule Take 1 capsule (360 mg total) by mouth daily. Please keep scheduled appointment for additional refills. (Patient taking differently: Take 360 mg by mouth daily.) 90 capsule 0   DULoxetine (CYMBALTA) 60 MG capsule Take 60 mg by mouth 2 (two) times daily.     ELIQUIS 5 MG TABS tablet TAKE 1 TABLET(5 MG) BY MOUTH TWICE DAILY (Patient taking differently: Take 5 mg by mouth 2 (two) times daily.) 60 tablet 11   finasteride (PROSCAR) 5 MG tablet Take 1 tablet (5 mg total) by mouth daily. 30 tablet 0   gabapentin (NEURONTIN) 100 MG capsule Take 1 capsule (100 mg total) by mouth 3 (three) times daily. (Patient not taking: Reported on 05/19/2022) 90 capsule 0   LORazepam (ATIVAN) 0.5 MG tablet Take 0.5 mg by mouth every 8 (eight) hours as needed for anxiety. (Patient not taking: Reported on 05/19/2022)     montelukast (SINGULAIR) 10 MG tablet Take 10 mg by mouth at bedtime as needed (for seasonal allergies). (Patient not taking: Reported on 05/19/2022)     omeprazole (PRILOSEC) 20 MG capsule Take 20 mg by mouth daily as needed (for acid reflux).     rOPINIRole (REQUIP) 1 MG tablet Take 1-2 tablets (1-2 mg total) by mouth at bedtime as needed (restless leg syndrome). 1 tablet after lunch and 2 tablet at bedtime (Patient taking differently: Take 3  mg by mouth at bedtime.) 120 tablet 0   tadalafil (CIALIS) 5 MG tablet Take 5 mg by mouth daily as needed (as directed). (Patient not taking: Reported on 05/19/2022)     tamsulosin (FLOMAX) 0.4 MG CAPS capsule Take 1 capsule (0.4 mg total) by mouth daily after supper. 30  capsule 1   tiZANidine (ZANAFLEX) 4 MG tablet Take 4-8 mg by mouth 3 (three) times daily as needed for muscle spasms. (Patient not taking: Reported on 05/19/2022)     No current facility-administered medications for this visit.     Past Medical History:  Diagnosis Date   Allergic rhinitis    Anxiety    Asthma    as a child   Chronic low back pain    Complication of anesthesia    "hard to put asleep, hard to wake up, nausea and vomiting"   Depression    ED (erectile dysfunction)    GERD (gastroesophageal reflux disease)    HLD (hyperlipidemia)    HTN (hypertension)    sees Dr. [name], [facility]   Hypertrophy of prostate with urinary obstruction and other lower urinary tract symptoms (LUTS)    IBS (irritable bowel syndrome)    Inguinal hernia without mention of obstruction or gangrene, unilateral or unspecified, (not specified as recurrent)    Insomnia    Lumbar herniated disc    L7   Morbid obesity ([place])    Narcotic addiction ([place])    NICM (nonischemic cardiomyopathy) ([place])    tachycardia mediated,  resolved with sinus   Persistent atrial fibrillation ([place])    ablation done 03/2006 at [facility]   Pneumonia    hx of 2004   PONV (postoperative nausea and vomiting)    Twitching    legs    Past Surgical History:  Procedure Laterality Date   ATRIAL ABLATION SURGERY  2007   [facility]   CARDIOVERSION  08/11/2011   Procedure: CARDIOVERSION;  Surgeon: Lelon Perla, MD;  Location: [facility];  Service: Cardiovascular;  Laterality: N/A;   CARDIOVERSION N/A 04/16/2017   Procedure: CARDIOVERSION;  Surgeon: Sanda Klein, MD;  Location: [facility] ENDOSCOPY;  Service: Cardiovascular;  Laterality: N/A;   CARDIOVERSION N/A 03/04/2019   Procedure: CARDIOVERSION;  Surgeon: Lelon Perla, MD;  Location: [facility] ENDOSCOPY;  Service: Cardiovascular;  Laterality: N/A;   CARDIOVERSION N/A 07/04/2019   Procedure: CARDIOVERSION;  Surgeon: [name], MD;  Location: [facility] ENDOSCOPY;  Service:  Cardiovascular;  Laterality: N/A;   EYE SURGERY     lasik, [place] Right 08/26/2012   Procedure: LAPAROSCOPIC INGUINAL HERNIA;  Surgeon: Madilyn Hook, DO;  Location: [place];  Service: General;  Laterality: Right;  laparoscopic right inguinal hernia repair with mesh, umbilical hernia repair   INSERTION OF MESH Right 08/26/2012   Procedure: INSERTION OF MESH;  Surgeon: Madilyn Hook, DO;  Location: [place];  Service: General;  Laterality: Right;  right inguinal hernia   TEE WITHOUT CARDIOVERSION N/A 03/04/2019   Procedure: TRANSESOPHAGEAL ECHOCARDIOGRAM (TEE);  Surgeon: Lelon Perla, MD;  Location: [place];  Service: Cardiovascular;  Laterality: N/A;   UMBILICAL HERNIA REPAIR N/A 08/26/2012   Procedure: HERNIA REPAIR UMBILICAL ADULT;  Surgeon: Madilyn Hook, DO;  Location: [facility] OR;  Service: General;  Laterality: N/A;    Social History   Socioeconomic History   Marital status: Married    Spouse name: Sabre Leonetti   Number of children: 3   Years  of education: Not on file   Highest education level: Bachelor's degree (e.g., BA, AB, BS)  Occupational History   Occupation: retired  Tobacco Use   Smoking status: Never   Smokeless tobacco: Never  Vaping Use   Vaping Use: Never used  Substance and Sexual Activity   Alcohol use: No    Alcohol/week: 0.0 standard drinks of alcohol   Drug use: No   Sexual activity: Yes  Other Topics Concern   Not on file  Social History Narrative   Lives in [place] Strain: Low Risk  (04/17/2021)   Overall Financial Resource Strain (CARDIA)    Difficulty of Paying Living Expenses: Not hard at all  Food Insecurity: No Food Insecurity (04/25/2022)   Hunger Vital Sign    Worried About Running Out of Food in the Last Year: Never true    Ran Out of Food in the Last Year: Never true  Transportation Needs: No Transportation Needs (04/25/2022)   PRAPARE  - Hydrologist (Medical): No    Lack of Transportation (Non-Medical): No  Physical Activity: Not on file  Stress: Not on file  Social Connections: Not on file  Intimate Partner Violence: Not At Risk (04/25/2022)   Humiliation, Afraid, Rape, and Kick questionnaire    Fear of Current or Ex-Partner: No    Emotionally Abused: No    Physically Abused: No    Sexually Abused: No    Family History  Problem Relation Age of Onset   Asthma Mother    Breast cancer Mother    Hypertension Mother    COPD Father    Prostate cancer Father    Hypertension Father    Lymphoma Sister     ROS: no fevers or chills, productive cough, hemoptysis, dysphasia, odynophagia, melena, hematochezia, dysuria, hematuria, rash, seizure activity, orthopnea, PND, pedal edema, claudication. Remaining systems are negative.  Physical Exam: Well-developed well-nourished in no acute distress.  Skin is warm and dry.  HEENT is normal.  Neck is supple.  Chest is clear to auscultation with normal expansion.  Cardiovascular exam is regular rate and rhythm.  Abdominal exam nontender or distended. No masses palpated. Extremities show no edema. neuro grossly intact  ECG- personally reviewed  A/P  1 permanent atrial fibrillation-continue carvedilol and Cardizem for rate control.  Continue apixaban.  2 chronic diastolic congestive heart failure-volume status is reasonable.  Continue diuretic at present dose.  Add Farxiga 10 mg daily.  Check potassium and renal function in 1 week.  3 mitral regurgitation-mild on most recent echocardiogram.  4 hypertension-blood pressure controlled.  Continue present medications.  5 history of cardiomyopathy-LV function has normalized on most recent echocardiogram.  6 obesity-we again discussed the importance of weight loss.  7 chronic kidney disease-patient recently admitted with urinary retention and acute renal failure.  Some improvement with placing  Foley catheter.  Follow-up nephrology and urology.  8 snoring-we will reschedule his sleep study.  Kirk Ruths, MD

## 2022-06-25 ENCOUNTER — Ambulatory Visit: Payer: 59 | Admitting: Cardiology

## 2022-07-17 ENCOUNTER — Encounter (HOSPITAL_BASED_OUTPATIENT_CLINIC_OR_DEPARTMENT_OTHER): Payer: Medicare Other | Attending: General Surgery | Admitting: General Surgery

## 2022-07-17 DIAGNOSIS — I5032 Chronic diastolic (congestive) heart failure: Secondary | ICD-10-CM | POA: Diagnosis not present

## 2022-07-17 DIAGNOSIS — Z7901 Long term (current) use of anticoagulants: Secondary | ICD-10-CM | POA: Diagnosis not present

## 2022-07-17 DIAGNOSIS — L97522 Non-pressure chronic ulcer of other part of left foot with fat layer exposed: Secondary | ICD-10-CM | POA: Diagnosis not present

## 2022-07-17 DIAGNOSIS — L97822 Non-pressure chronic ulcer of other part of left lower leg with fat layer exposed: Secondary | ICD-10-CM | POA: Diagnosis present

## 2022-07-17 DIAGNOSIS — I110 Hypertensive heart disease with heart failure: Secondary | ICD-10-CM | POA: Diagnosis not present

## 2022-07-17 DIAGNOSIS — Z6839 Body mass index (BMI) 39.0-39.9, adult: Secondary | ICD-10-CM | POA: Insufficient documentation

## 2022-07-17 DIAGNOSIS — I872 Venous insufficiency (chronic) (peripheral): Secondary | ICD-10-CM | POA: Diagnosis not present

## 2022-07-17 NOTE — Progress Notes (Addendum)
AYDN, FERRARA ([id number]) [id number] Page 1 of 13 Visit Report for 07/17/2022 Chief Complaint Document Details Patient Name: Date of Service: Clinton Liner RD O. 07/17/2022 8:00 A M Medical Record Number: [id number] Patient Account Number: [id number] Date of Birth/Sex: Treating RN: 26-Oct-1955 (67 y.o. M) Primary Care Provider: [name] Other Clinician: Referring Provider: Treating Provider/Extender: [name] in Treatment: 0 Information Obtained from: Patient Chief Complaint Patient presents for treatment of an open ulcer due to venous insufficiency Electronic Signature(s) Signed: 07/17/2022 9:11:55 AM By: Fredirick Maudlin MD FACS Entered By: Fredirick Maudlin on 07/17/2022 09:11:55 -------------------------------------------------------------------------------- Debridement Details Patient Name: Date of Service: Clinton Liner RD O. 07/17/2022 8:00 A M Medical Record Number: [id number] Patient Account Number: [id number] Date of Birth/Sex: Treating RN: 1956-03-14 (67 y.o. [name] Primary Care Provider: [name] Other Clinician: Referring Provider: Treating Provider/Extender: [name] in Treatment: 0 Debridement Performed for Assessment: Wound #4 Left,Lateral Foot Performed By: Physician Fredirick Maudlin, MD Debridement Type: Debridement Level of Consciousness (Pre-procedure): Awake and Alert Pre-procedure Verification/Time Out Yes - 08:56 Taken: Start Time: 08:56 Pain Control: Lidocaine 4% T opical Solution T Area Debrided (L x W): otal 2.5 (cm) x 1.5 (cm) = 3.75 (cm) Tissue and other material debrided: Non-Viable, Slough, Subcutaneous, Slough Level: Skin/Subcutaneous Tissue Debridement Description: Excisional Instrument: Curette Bleeding: Minimum Hemostasis Achieved: Pressure End Time: 08:58 Procedural Pain: 0 Post Procedural Pain: 0 Response to Treatment:  Procedure was tolerated well Level of Consciousness (Post- Awake and Alert procedure): Post Debridement Measurements of Total Wound Length: (cm) 2.5 Width: (cm) 1.5 Depth: (cm) 0.1 Volume: (cm) 0.295 Character of Wound/Ulcer Post Debridement: Improved Post Procedure Diagnosis [name], [name] ([id number]) [id number] Page 2 of 13 Same as Pre-procedure Notes Scribed for Dr. Celine Ahr by J.Scotton Electronic Signature(s) Signed: 07/17/2022 10:30:34 AM By: Fredirick Maudlin MD FACS Signed: 07/17/2022 4:41:34 PM By: Dellie Catholic RN Entered By: Dellie Catholic on 07/17/2022 09:12:30 -------------------------------------------------------------------------------- Debridement Details Patient Name: Date of Service: Clinton Liner RD O. 07/17/2022 8:00 A M Medical Record Number: [id number] Patient Account Number: [id number] Date of Birth/Sex: Treating RN: 01-09-1956 (67 y.o. [name] Primary Care Provider: [name] Other Clinician: Referring Provider: Treating Provider/Extender: [name] in Treatment: 0 Debridement Performed for Assessment: Wound #5 Left,Lateral Ankle Performed By: Physician Fredirick Maudlin, MD Debridement Type: Debridement Level of Consciousness (Pre-procedure): Awake and Alert Pre-procedure Verification/Time Out Yes - 08:56 Taken: Start Time: 08:56 Pain Control: Lidocaine 4% T opical Solution T Area Debrided (L x W): otal 1 (cm) x 0.2 (cm) = 0.2 (cm) Tissue and other material debrided: Non-Viable, Slough, Subcutaneous, Slough Level: Skin/Subcutaneous Tissue Debridement Description: Excisional Instrument: Curette Bleeding: Minimum Hemostasis Achieved: Pressure End Time: 08:58 Procedural Pain: 0 Post Procedural Pain: 0 Response to Treatment: Procedure was tolerated well Level of Consciousness (Post- Awake and Alert procedure): Post Debridement Measurements of Total Wound Length: (cm)  1 Width: (cm) 0.2 Depth: (cm) 0.1 Volume: (cm) 0.016 Character of Wound/Ulcer Post Debridement: Improved Post Procedure Diagnosis Same as Pre-procedure Notes Scribed for Dr. Celine Ahr by J.S. Electronic Signature(s) Signed: 07/17/2022 10:30:34 AM By: Fredirick Maudlin MD FACS Signed: 07/17/2022 4:41:34 PM By: Dellie Catholic RN Entered By: Dellie Catholic on 07/17/2022 09:13:31 [name] ([id number]) [id number] Page 3 of 13 -------------------------------------------------------------------------------- Debridement Details Patient Name: Date of Service: Clinton Liner RD O. 07/17/2022 8:00 A M Medical Record Number: [id number] Patient Account Number: [id number] Date of Birth/Sex: Treating RN: 07/06/1956 (  67 y.o. M) Primary Care Provider: [name] Other Clinician: Referring Provider: Treating Provider/Extender: [name] in Treatment: 0 Debridement Performed for Assessment: Wound #3 Left,Anterior Lower Leg Performed By: Physician Fredirick Maudlin, MD Debridement Type: Debridement Severity of Tissue Pre Debridement: Fat layer exposed Level of Consciousness (Pre-procedure): Awake and Alert Pre-procedure Verification/Time Out Yes - 08:56 Taken: Start Time: 08:56 Pain Control: Lidocaine 4% T opical Solution T Area Debrided (L x W): otal 10 (cm) x 5 (cm) = 50 (cm) Tissue and other material debrided: Non-Viable, Slough, Subcutaneous, Slough Level: Skin/Subcutaneous Tissue Debridement Description: Excisional Instrument: Curette Bleeding: Minimum Hemostasis Achieved: Pressure End Time: 08:58 Procedural Pain: 0 Post Procedural Pain: 0 Response to Treatment: Procedure was tolerated well Level of Consciousness (Post- Awake and Alert procedure): Post Debridement Measurements of Total Wound Length: (cm) 20 Width: (cm) 19 Depth: (cm) 0.1 Volume: (cm) 29.845 Character of Wound/Ulcer Post Debridement:  Improved Severity of Tissue Post Debridement: Fat layer exposed Post Procedure Diagnosis Same as Pre-procedure Notes Scribed for Dr.Tiernan Millikin by J.Scotton Electronic Signature(s) Signed: 07/17/2022 9:20:50 AM By: Fredirick Maudlin MD FACS Previous Signature: 07/17/2022 9:20:30 AM Version By: Fredirick Maudlin MD FACS Entered By: Fredirick Maudlin on 07/17/2022 09:20:50 -------------------------------------------------------------------------------- HPI Details Patient Name: Date of Service: Clinton Liner RD O. 07/17/2022 8:00 A M Medical Record Number: [id number] Patient Account Number: [id number] Date of Birth/Sex: Treating RN: 1955/07/10 (67 y.o. M) Primary Care Provider: [name] Other Clinician: Referring Provider: Treating Provider/Extender: [name] in Treatment: 0 History of Present Illness HPI Description: ADMISSION 02/24/2022 This is a 67 year old man with a past medical history significant for congestive heart failure, morbid obesity, dilated cardiomyopathy, atrial fibrillation, and lower extremity cellulitis. He is currently being treated with Keflex. He is not diabetic. He does not smoke. He says that he developed some blisters on his left anterior tibial surface which subsequently broke open causing the wounds for which she is here to see [place] today. He says that he occasionally wears [name], [name] ([id number]) [id number] Page 4 of 13 compression stockings but does not do so on a regular basis. He reports that "they look stupid with shorts." ABI in clinic today was 0.94. On the left anterior tibial surface, there are several small wounds in a geographic pattern. They do appear consistent with blisters that have ruptured. The fat layer is exposed and there is a thin layer of slough on the surfaces. He does have some surrounding erythema, but no purulent drainage. Edema control is very poor. 03/03/2022: The patient  came in for a nurse visit last week because he thought the compression wraps were too tight. They were reapplied and apparently he did not understand the instruction to try to stay off his feet is much as possible and keep his leg elevated; he instead walked excessively on both Friday and Saturday. As result his leg became more swollen, the wrap became uncomfortable and he cut it off yesterday. He replaced it with a wrap of his own fashion. Today he has an indentation in his leg where his homemade wrap ended. He has extensive 3+ nonpitting edema above this. The wounds themselves are in fairly decent condition with just a little bit of slough on the surface, but edema fluid is frankly pouring out of the largest wound. 03/14/2022: Significant improvement in his wound. He has come in a couple of times for nurse visits due to drainage. Today the wound is quite a bit smaller and just has a  layer of eschar and slough on the surface. Edema control is better. 03/23/2022: Ongoing improvement in his wound. His edema control is suboptimal today, however, because his wrap slid down. There is also evidence of excoriation suggesting that perhaps he was scratching under the wrap and it slid. There is a little bit of eschar and slough on the wound surface. 03/28/2022: The patient has been fairly noncompliant with his compression wraps. As result, he has opened a new area of wounding on his anterior tibial surface just proximal to the original site. It is limited breakdown of skin. The original site is smaller and has a little slough buildup. He did see his cardiologist this week and is now taking torsemide twice a day. 04/04/2022: The patient continues to be fairly noncompliant with his care recommendations. He fails to show up for his venous reflux studies on Monday. He continues to cut his wraps off. Although he is supposed to be taking torsemide twice a day, the degree of edema in his legs suggest that perhaps he is  not being compliant with his medication, as well. His blood pressure was elevated today and he said that he did not take his antihypertensives. He claims that the methocarbamol and tizanidine he was prescribed for muscle spasms are responsible for his leg swelling. He has developed a new wound on his right anterior tibial surface. This is fairly small and limited to breakdown of skin. There is a small amount of slough present. The wounds on his left lower leg do not look at all improved. There is slough accumulation on a couple of the deeper spots. He also has an area on his dorsal foot and anterior ankle that look like they are threatening to breakdown; this is where he had cut his wrap back to so that they were rubbing right in this location. 04/14/2022: The right leg has healed and there has been substantial improvement to the wounds on the left. 04/21/2022: The left leg has deteriorated considerably. It is massively edematous and erythematous. The wound is much larger and is pouring fluid. He says that he got it wet in the shower when his cast protector apparently leaked. He says that he had to walk half a mile this morning to his car, as it had been parked some distance away. 05/01/2022: Since our last visit, the patient was admitted to the hospital due to shortness of breath and was found to have severe urinary retention. A Foley catheter was placed and he was diuresed. He continues to have the indwelling catheter and has an appointment with urology later today. His wound has not really improved at all. His leg is quite edematous with 3+ pitting edema to the mid thigh. The wound is erythematous and the skin is macerated. There is some slough and nonviable subcutaneous tissue present on the wound surface. READMISSION 07/17/2022 The patient returns after having not been seen since October. He was seen in the vein and vascular surgery clinic in November and found to have significant venous reflux.  He is going to see Dr. Scot Dock sometime in the coming months, presumably to discuss saphenous vein ablation. They also recommended Unna boot compression and leg elevation, something the patient had been fairly noncompliant with during his first admission in our clinic. He returns today with residual ulcers on his left anterior tibial surface as well as a wound on his lateral foot, secondary to footwear injury. The wounds are dripping edema fluid and he is complaining of pain. There is  slough accumulation on all of the open surfaces. Edema control is nonexistent. Electronic Signature(s) Signed: 07/17/2022 9:15:37 AM By: Fredirick Maudlin MD FACS Entered By: Fredirick Maudlin on 07/17/2022 09:15:37 -------------------------------------------------------------------------------- Physical Exam Details Patient Name: Date of Service: Clinton Liner RD O. 07/17/2022 8:00 A M Medical Record Number: [id number] Patient Account Number: [id number] Date of Birth/Sex: Treating RN: 1956/02/15 (67 y.o. M) Primary Care Provider: [name] Other Clinician: Referring Provider: Treating Provider/Extender: [name] in Treatment: 0 Constitutional . . . . No acute distress. Respiratory Normal work of breathing on room air. Cardiovascular 2+ pitting edema to at least the knee on the left; right leg not inspected. Notes 07/17/2022: He returns today with residual ulcers on his left anterior tibial surface as well as a wound on his lateral foot, secondary to footwear injury. The wounds are dripping edema fluid and he is complaining of pain. There is slough accumulation on all of the open surfaces. Edema control is nonexistent. [name], [name] ([id number]) [id number] Page 5 of 13 Electronic Signature(s) Signed: 07/17/2022 9:17:49 AM By: Fredirick Maudlin MD FACS Entered By: Fredirick Maudlin on 07/17/2022  09:17:49 -------------------------------------------------------------------------------- Physician Orders Details Patient Name: Date of Service: Clinton Liner RD O. 07/17/2022 8:00 A M Medical Record Number: [id number] Patient Account Number: [id number] Date of Birth/Sex: Treating RN: 12-May-1956 (67 y.o. [name] Primary Care Provider: [name] Other Clinician: Referring Provider: Treating Provider/Extender: [name] in Treatment: 0 Verbal / Phone Orders: No Diagnosis Coding ICD-10 Coding Code Description [phone] Non-pressure chronic ulcer of other part of left lower leg with fat layer exposed L97.522 Non-pressure chronic ulcer of other part of left foot with fat layer exposed I87.2 Venous insufficiency (chronic) (peripheral) J09.32 Chronic diastolic (congestive) heart failure E66.01 Morbid (severe) obesity due to excess calories I10 Essential (primary) hypertension Z79.01 Long term (current) use of anticoagulants Follow-up Appointments ppointment in 1 week. - Dr. Celine Ahr Room 3 Return A Bathing/ Shower/ Hygiene May shower with protection but do not get wound dressing(s) wet. Protect dressing(s) with water repellant cover (for example, large plastic bag) or a cast cover and may then take shower. - DO NOT GET LEFT LEG WET . Use a cast protector if showering. Edema Control - Lymphedema / SCD / Other void standing for long periods of time. - Elevate legs throughout the day. A Exercise regularly - May exercise but also remember to elevate legs Wound Treatment Wound #3 - Lower Leg Wound Laterality: Left, Anterior Cleanser: Soap and Water 1 x Per Week/30 Days Discharge Instructions: May shower and wash wound with dial antibacterial soap and water prior to dressing change. Cleanser: Wound Cleanser 1 x Per Week/30 Days Discharge Instructions: Cleanse the wound with wound cleanser prior to applying a clean dressing using gauze sponges, not  tissue or cotton balls. Peri-Wound Care: Triamcinolone 15 (g) 1 x Per Week/30 Days Discharge Instructions: Use triamcinolone 15 (g) as directed Prim Dressing: Sorbalgon AG Dressing 6x6 (in/in) 1 x Per Week/30 Days ary Discharge Instructions: Apply to wound bed as instructed Secondary Dressing: Woven Gauze Sponge, Non-Sterile 4x4 in 1 x Per Week/30 Days Discharge Instructions: Apply over primary dressing as directed. Secondary Dressing: Zetuvit Plus 4x8 in 1 x Per Week/30 Days Discharge Instructions: Apply over primary dressing as directed. Wound #4 - Foot Wound Laterality: Left, Lateral Cleanser: Soap and Water 1 x Per Week/30 Days Discharge Instructions: May shower and wash wound with dial antibacterial soap and water prior to dressing change.  Cleanser: Wound Cleanser 1 x Per Week/30 Days Discharge Instructions: Cleanse the wound with wound cleanser prior to applying a clean dressing using gauze sponges, not tissue or cotton balls. Peri-Wound Care: Triamcinolone 15 (g) 1 x Per Week/30 Days NASHON, ERBES ([id number]) [id number] Page 6 of 13 Discharge Instructions: Use triamcinolone 15 (g) as directed Prim Dressing: Sorbalgon AG Dressing 2x2 (in/in) 1 x Per Week/30 Days ary Discharge Instructions: Apply to wound bed as instructed Compression Wrap: ThreePress (3 layer compression wrap) 1 x Per Week/30 Days Discharge Instructions: Apply three layer compression as directed. Wound #5 - Ankle Wound Laterality: Left, Lateral Cleanser: Soap and Water 1 x Per Week/30 Days Discharge Instructions: May shower and wash wound with dial antibacterial soap and water prior to dressing change. Cleanser: Wound Cleanser 1 x Per Week/30 Days Discharge Instructions: Cleanse the wound with wound cleanser prior to applying a clean dressing using gauze sponges, not tissue or cotton balls. Peri-Wound Care: Triamcinolone 15 (g) 1 x Per Week/30 Days Discharge Instructions: Use  triamcinolone 15 (g) as directed Prim Dressing: Sorbalgon AG Dressing 2x2 (in/in) 1 x Per Week/30 Days ary Discharge Instructions: Apply to wound bed as instructed Secondary Dressing: Woven Gauze Sponge, Non-Sterile 4x4 in 1 x Per Week/30 Days Discharge Instructions: Apply over primary dressing as directed. Secondary Dressing: Zetuvit Plus 4x8 in 1 x Per Week/30 Days Discharge Instructions: Apply over primary dressing as directed. Compression Wrap: ThreePress (3 layer compression wrap) 1 x Per Week/30 Days Discharge Instructions: Apply three layer compression as directed. Wound #6 - Hand - 5th Digit Wound Laterality: Left Secondary Dressing: Bordered Gauze, 2x2 in Discharge Instructions: Apply over primary dressing as directed. Electronic Signature(s) Signed: 07/17/2022 4:41:34 PM By: Dellie Catholic RN Signed: 07/18/2022 7:39:56 AM By: Fredirick Maudlin MD FACS Previous Signature: 07/17/2022 10:30:34 AM Version By: Fredirick Maudlin MD FACS Previous Signature: 07/17/2022 9:18:04 AM Version By: Fredirick Maudlin MD FACS Entered By: Dellie Catholic on 07/17/2022 16:37:17 -------------------------------------------------------------------------------- Problem List Details Patient Name: Date of Service: Clinton Liner RD O. 07/17/2022 8:00 A M Medical Record Number: [id number] Patient Account Number: [id number] Date of Birth/Sex: Treating RN: December 05, 1955 (67 y.o. M) Primary Care Provider: [name] Other Clinician: Referring Provider: Treating Provider/Extender: [name] in Treatment: 0 Active Problems ICD-10 Encounter Code Description Active Date MDM Diagnosis [phone] Non-pressure chronic ulcer of other part of left lower leg with fat layer exposed1/05/2023 No Yes L97.522 Non-pressure chronic ulcer of other part of left foot with fat layer exposed 07/17/2022 No Yes I87.2 Venous insufficiency (chronic) (peripheral) 07/17/2022 No Yes [name], [name]  ([id number]) [id number] Page 7 of 13 I14.43 Chronic diastolic (congestive) heart failure 07/17/2022 No Yes E66.01 Morbid (severe) obesity due to excess calories 07/17/2022 No Yes I10 Essential (primary) hypertension 07/17/2022 No Yes Z79.01 Long term (current) use of anticoagulants 07/17/2022 No Yes Inactive Problems Resolved Problems Electronic Signature(s) Signed: 07/17/2022 9:10:30 AM By: Fredirick Maudlin MD FACS Previous Signature: 07/17/2022 8:30:02 AM Version By: Fredirick Maudlin MD FACS Entered By: Fredirick Maudlin on 07/17/2022 09:10:30 -------------------------------------------------------------------------------- Progress Note Details Patient Name: Date of Service: Clinton Liner RD O. 07/17/2022 8:00 A M Medical Record Number: [id number] Patient Account Number: [id number] Date of Birth/Sex: Treating RN: Jan 29, 1956 (67 y.o. M) Primary Care Provider: [name] Other Clinician: Referring Provider: Treating Provider/Extender: [name] in Treatment: 0 Subjective Chief Complaint Information obtained from Patient Patient presents for treatment of an open ulcer due to venous insufficiency History of Present Illness (HPI)  ADMISSION 02/24/2022 This is a 67 year old man with a past medical history significant for congestive heart failure, morbid obesity, dilated cardiomyopathy, atrial fibrillation, and lower extremity cellulitis. He is currently being treated with Keflex. He is not diabetic. He does not smoke. He says that he developed some blisters on his left anterior tibial surface which subsequently broke open causing the wounds for which she is here to see [place] today. He says that he occasionally wears compression stockings but does not do so on a regular basis. He reports that "they look stupid with shorts." ABI in clinic today was 0.94. On the left anterior tibial surface, there are several small wounds in a geographic  pattern. They do appear consistent with blisters that have ruptured. The fat layer is exposed and there is a thin layer of slough on the surfaces. He does have some surrounding erythema, but no purulent drainage. Edema control is very poor. 03/03/2022: The patient came in for a nurse visit last week because he thought the compression wraps were too tight. They were reapplied and apparently he did not understand the instruction to try to stay off his feet is much as possible and keep his leg elevated; he instead walked excessively on both Friday and Saturday. As result his leg became more swollen, the wrap became uncomfortable and he cut it off yesterday. He replaced it with a wrap of his own fashion. Today he has an indentation in his leg where his homemade wrap ended. He has extensive 3+ nonpitting edema above this. The wounds themselves are in fairly decent condition with just a little bit of slough on the surface, but edema fluid is frankly pouring out of the largest wound. 03/14/2022: Significant improvement in his wound. He has come in a couple of times for nurse visits due to drainage. Today the wound is quite a bit smaller and just has a layer of eschar and slough on the surface. Edema control is better. 03/23/2022: Ongoing improvement in his wound. His edema control is suboptimal today, however, because his wrap slid down. There is also evidence of excoriation suggesting that perhaps he was scratching under the wrap and it slid. There is a little bit of eschar and slough on the wound surface. 03/28/2022: The patient has been fairly noncompliant with his compression wraps. As result, he has opened a new area of wounding on his anterior tibial surface just proximal to the original site. It is limited breakdown of skin. The original site is smaller and has a little slough buildup. He did see his cardiologist this week and is now taking torsemide twice a day. 04/04/2022: The patient continues to be  fairly noncompliant with his care recommendations. He fails to show up for his venous reflux studies on Monday. He [name], [name] ([id number]) [id number] Page 8 of 13 continues to cut his wraps off. Although he is supposed to be taking torsemide twice a day, the degree of edema in his legs suggest that perhaps he is not being compliant with his medication, as well. His blood pressure was elevated today and he said that he did not take his antihypertensives. He claims that the methocarbamol and tizanidine he was prescribed for muscle spasms are responsible for his leg swelling. He has developed a new wound on his right anterior tibial surface. This is fairly small and limited to breakdown of skin. There is a small amount of slough present. The wounds on his left lower leg do not look at all improved. There is  slough accumulation on a couple of the deeper spots. He also has an area on his dorsal foot and anterior ankle that look like they are threatening to breakdown; this is where he had cut his wrap back to so that they were rubbing right in this location. 04/14/2022: The right leg has healed and there has been substantial improvement to the wounds on the left. 04/21/2022: The left leg has deteriorated considerably. It is massively edematous and erythematous. The wound is much larger and is pouring fluid. He says that he got it wet in the shower when his cast protector apparently leaked. He says that he had to walk half a mile this morning to his car, as it had been parked some distance away. 05/01/2022: Since our last visit, the patient was admitted to the hospital due to shortness of breath and was found to have severe urinary retention. A Foley catheter was placed and he was diuresed. He continues to have the indwelling catheter and has an appointment with urology later today. His wound has not really improved at all. His leg is quite edematous with 3+ pitting edema to  the mid thigh. The wound is erythematous and the skin is macerated. There is some slough and nonviable subcutaneous tissue present on the wound surface. READMISSION 07/17/2022 The patient returns after having not been seen since October. He was seen in the vein and vascular surgery clinic in November and found to have significant venous reflux. He is going to see Dr. Scot Dock sometime in the coming months, presumably to discuss saphenous vein ablation. They also recommended Unna boot compression and leg elevation, something the patient had been fairly noncompliant with during his first admission in our clinic. He returns today with residual ulcers on his left anterior tibial surface as well as a wound on his lateral foot, secondary to footwear injury. The wounds are dripping edema fluid and he is complaining of pain. There is slough accumulation on all of the open surfaces. Edema control is nonexistent. Patient History Information obtained from Patient. Allergies hydrocodone (Severity: Moderate, Reaction: rash) Family History Cancer - Father,Mother, Hypertension - Mother,Father, No family history of Diabetes, Heart Disease, Hereditary Spherocytosis, Kidney Disease, Lung Disease, Seizures, Thyroid Problems, Tuberculosis. 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 Medical History Eyes Patient has history of Cataracts - 2021 Ear/Nose/Mouth/Throat Denies history of Chronic sinus problems/congestion, Middle ear problems Hematologic/Lymphatic Patient has history of Lymphedema Respiratory Patient has history of Sleep Apnea Cardiovascular Patient has history of Congestive Heart Failure, Coronary Artery Disease, Hypertension Denies history of Deep Vein Thrombosis Gastrointestinal Denies history of Cirrhosis , Colitis, Crohnoos Endocrine Denies history of Type I Diabetes, Type II  Diabetes Genitourinary Denies history of End Stage Renal Disease Immunological Denies history of Raynaudoos, Scleroderma Integumentary (Skin) Denies history of History of Burn Musculoskeletal Denies history of Gout, Rheumatoid Arthritis, Osteoarthritis, Osteomyelitis Neurologic Denies history of Dementia, Neuropathy, Quadriplegia, Paraplegia, Seizure Disorder Oncologic Denies history of Received Chemotherapy, Received Radiation Psychiatric Patient has history of Confinement Anxiety Denies history of Anorexia/bulimia Hospitalization/Surgery History - Inguinal Hernia repair- 2014. - insertion of mesh-2014. - Atrial ablation surgery- 2007. - BIla eyes lasik- 2008. - hernia repair- 1977. Medical A Surgical History Notes nd Respiratory Allergic Rhinitis Cardiovascular AFIB Review of Systems (ROS) Integumentary (Skin) Complains or has symptoms of Wounds. [name], [name] ([id number]) [id number] Page 9 of 13 Objective Constitutional No acute distress. Vitals Time Taken:  8:11 AM, Height: 76 in, Weight: 326 lbs, BMI: 39.7, Temperature: 97.9 F, Pulse: 73 bpm, Respiratory Rate: 18 breaths/min, Blood Pressure: 135/81 mmHg. Respiratory Normal work of breathing on room air. Cardiovascular 2+ pitting edema to at least the knee on the left; right leg not inspected. General Notes: 07/17/2022: He returns today with residual ulcers on his left anterior tibial surface as well as a wound on his lateral foot, secondary to footwear injury. The wounds are dripping edema fluid and he is complaining of pain. There is slough accumulation on all of the open surfaces. Edema control is nonexistent. Integumentary (Hair, Skin) Wound #3 status is Open. Original cause of wound was Gradually Appeared. The date acquired was: 05/12/2022. The wound is located on the Left,Anterior Lower Leg. The wound measures 20cm length x 19cm width x 0.1cm depth; 298.451cm^2 area and 29.845cm^3  volume. There is Fat Layer (Subcutaneous Tissue) exposed. There is no tunneling or undermining noted. There is a medium amount of serous drainage noted. There is medium (34-66%) pink granulation within the wound bed. There is a medium (34-66%) amount of necrotic tissue within the wound bed including Adherent Slough. The periwound skin appearance exhibited: Scarring, Maceration, Hemosiderin Staining. Periwound temperature was noted as No Abnormality. Wound #4 status is Open. Original cause of wound was Footwear Injury. The date acquired was: 07/02/2022. The wound is located on the Left,Lateral Foot. The wound measures 2.5cm length x 1.5cm width x 0.1cm depth; 2.945cm^2 area and 0.295cm^3 volume. There is Fat Layer (Subcutaneous Tissue) exposed. There is no tunneling or undermining noted. There is a small amount of serous drainage noted. There is small (1-33%) pink granulation within the wound bed. There is a large (67-100%) amount of necrotic tissue within the wound bed including Adherent Slough. The periwound skin appearance had no abnormalities noted for texture. The periwound skin appearance had no abnormalities noted for moisture. The periwound skin appearance had no abnormalities noted for color. Periwound temperature was noted as No Abnormality. Wound #5 status is Open. Original cause of wound was Footwear Injury. The date acquired was: 07/02/2022. The wound is located on the Left,Lateral Ankle. The wound measures 1cm length x 0.2cm width x 0.1cm depth; 0.157cm^2 area and 0.016cm^3 volume. There is Fat Layer (Subcutaneous Tissue) exposed. There is no tunneling or undermining noted. There is a small amount of serous drainage noted. There is medium (34-66%) red granulation within the wound bed. There is a medium (34-66%) amount of necrotic tissue within the wound bed including Adherent Slough. The periwound skin appearance had no abnormalities noted for texture. The periwound skin appearance had no  abnormalities noted for moisture. The periwound skin appearance had no abnormalities noted for color. Periwound temperature was noted as No Abnormality. Wound #6 status is Open. Original cause of wound was Laceration. The date acquired was: 07/17/2022. The wound is located on the Left Hand - 5th Digit. The wound measures 0.5cm length x 0.1cm width x 0.1cm depth; 0.039cm^2 area and 0.004cm^3 volume. There is no tunneling or undermining noted. There is a small amount of sanguinous drainage noted. There is large (67-100%) red granulation within the wound bed. There is no necrotic tissue within the wound bed. 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 There was a Excisional Skin/Subcutaneous Tissue Debridement with a total area of 50 sq cm performed by Fredirick Maudlin, MD. With the following instrument(s): Curette to remove Non-Viable tissue/material. Material removed includes Subcutaneous Tissue and Slough and after achieving pain control using Lidocaine 4% T opical Solution. A time out was conducted at 08:56, prior to the start of the procedure. A Minimum amount of bleeding was controlled with Pressure. The procedure was tolerated well with a pain level of 0 throughout and a pain level of 0 following the procedure. Post Debridement Measurements: 20cm length x 19cm width x 0.1cm depth; 29.845cm^3 volume. Character of Wound/Ulcer Post Debridement is improved. Severity of  Tissue Post Debridement is: Fat layer exposed. Post procedure Diagnosis Wound #3: Same as Pre-Procedure General Notes: Scribed for Dr.Keirstyn Aydt by J.Scotton. Pre-procedure diagnosis of Wound #3 is a Venous Leg Ulcer located on the Left,Anterior Lower Leg . There was a Three Layer Compression Therapy Procedure by Dellie Catholic, RN. Post procedure Diagnosis Wound #3: Same as Pre-Procedure Wound #4 Pre-procedure diagnosis of Wound #4 is an Abrasion located on the Left,Lateral Foot . There was a Excisional Skin/Subcutaneous Tissue Debridement with a [name], CHIRIBOGA ([id number]) [id number] Page 10 of 13 total area of 3.75 sq cm performed by Fredirick Maudlin, MD. With the following instrument(s): Curette to remove Non-Viable tissue/material. Material removed includes Subcutaneous Tissue and Slough and after achieving pain control using Lidocaine 4% T opical Solution. No specimens were taken. A time out was conducted at 08:56, prior to the start of the procedure. A Minimum amount of bleeding was controlled with Pressure. The procedure was tolerated well with a pain level of 0 throughout and a pain level of 0 following the procedure. Post Debridement Measurements: 2.5cm length x 1.5cm width x 0.1cm depth; 0.295cm^3 volume. Character of Wound/Ulcer Post Debridement is improved. Post procedure Diagnosis Wound #4: Same as Pre-Procedure General Notes: Scribed for Dr. Celine Ahr by J.Scotton. Pre-procedure diagnosis of Wound #4 is an Abrasion located on the Left,Lateral Foot . There was a Three Layer Compression Therapy Procedure by Dellie Catholic, RN. Post procedure Diagnosis Wound #4: Same as Pre-Procedure Wound #5 Pre-procedure diagnosis of Wound #5 is an Abrasion located on the Left,Lateral Ankle . There was a Excisional Skin/Subcutaneous Tissue Debridement with a total area of 0.2 sq cm performed by Fredirick Maudlin, MD. With the following instrument(s): Curette to remove  Non-Viable tissue/material. Material removed includes Subcutaneous Tissue and Slough and after achieving pain control using Lidocaine 4% T opical Solution. No specimens were taken. A time out was conducted at 08:56, prior to the start of the procedure. A Minimum amount of bleeding was controlled with Pressure. The procedure was tolerated well with a pain level of 0 throughout and a pain level of 0 following the procedure. Post Debridement Measurements: 1cm length x 0.2cm width x 0.1cm depth; 0.016cm^3 volume. Character of Wound/Ulcer Post Debridement is improved. Post procedure Diagnosis Wound #5: Same as Pre-Procedure General Notes: Scribed for Dr. Celine Ahr by Lenna Sciara.S.. Pre-procedure diagnosis of Wound #5 is an Abrasion located on the Left,Lateral Ankle . There was a Three Layer Compression Therapy Procedure by Dellie Catholic, RN. Post procedure Diagnosis Wound #5: Same as Pre-Procedure Plan Follow-up Appointments: Return Appointment in 1 week. - Dr. Celine Ahr Room 3 Bathing/ Shower/ Hygiene: May shower with protection but do not get wound  dressing(s) wet. Protect dressing(s) with water repellant cover (for example, large plastic bag) or a cast cover and may then take shower. - DO NOT GET LEFT LEG WET Use a cast protector if showering. . Edema Control - Lymphedema / SCD / Other: Avoid standing for long periods of time. - Elevate legs throughout the day. Exercise regularly - May exercise but also remember to elevate legs WOUND #3: - Lower Leg Wound Laterality: Left, Anterior Cleanser: Soap and Water 1 x Per Week/30 Days Discharge Instructions: May shower and wash wound with dial antibacterial soap and water prior to dressing change. Cleanser: Wound Cleanser 1 x Per Week/30 Days Discharge Instructions: Cleanse the wound with wound cleanser prior to applying a clean dressing using gauze sponges, not tissue or cotton balls. Peri-Wound Care: Triamcinolone 15 (g) 1 x Per Week/30 Days Discharge  Instructions: Use triamcinolone 15 (g) as directed Prim Dressing: Sorbalgon AG Dressing 6x6 (in/in) 1 x Per Week/30 Days ary Discharge Instructions: Apply to wound bed as instructed Secondary Dressing: Woven Gauze Sponge, Non-Sterile 4x4 in 1 x Per Week/30 Days Discharge Instructions: Apply over primary dressing as directed. Secondary Dressing: Zetuvit Plus 4x8 in 1 x Per Week/30 Days Discharge Instructions: Apply over primary dressing as directed. WOUND #4: - Foot Wound Laterality: Left, Lateral Cleanser: Soap and Water 1 x Per Week/30 Days Discharge Instructions: May shower and wash wound with dial antibacterial soap and water prior to dressing change. Cleanser: Wound Cleanser 1 x Per Week/30 Days Discharge Instructions: Cleanse the wound with wound cleanser prior to applying a clean dressing using gauze sponges, not tissue or cotton balls. Peri-Wound Care: Triamcinolone 15 (g) 1 x Per Week/30 Days Discharge Instructions: Use triamcinolone 15 (g) as directed Prim Dressing: Sorbalgon AG Dressing 2x2 (in/in) 1 x Per Week/30 Days ary Discharge Instructions: Apply to wound bed as instructed Com pression Wrap: ThreePress (3 layer compression wrap) 1 x Per Week/30 Days Discharge Instructions: Apply three layer compression as directed. WOUND #5: - Ankle Wound Laterality: Left, Lateral Cleanser: Soap and Water 1 x Per Week/30 Days Discharge Instructions: May shower and wash wound with dial antibacterial soap and water prior to dressing change. Cleanser: Wound Cleanser 1 x Per Week/30 Days Discharge Instructions: Cleanse the wound with wound cleanser prior to applying a clean dressing using gauze sponges, not tissue or cotton balls. Peri-Wound Care: Triamcinolone 15 (g) 1 x Per Week/30 Days Discharge Instructions: Use triamcinolone 15 (g) as directed Prim Dressing: Sorbalgon AG Dressing 2x2 (in/in) 1 x Per Week/30 Days ary Discharge Instructions: Apply to wound bed as instructed Secondary  Dressing: Woven Gauze Sponge, Non-Sterile 4x4 in 1 x Per Week/30 Days Discharge Instructions: Apply over primary dressing as directed. Secondary Dressing: Zetuvit Plus 4x8 in 1 x Per Week/30 Days Discharge Instructions: Apply over primary dressing as directed. Com pression Wrap: ThreePress (3 layer compression wrap) 1 x Per Week/30 Days Discharge Instructions: Apply three layer compression as directed. WOUND #6: - Hand - 5th Digit Wound Laterality: Left Secondary Dressing: Bordered Gauze, 2x2 in Discharge Instructions: Apply over primary dressing as directed. 07/17/2022: He returns today with residual ulcers on his left anterior tibial surface as well as a wound on his lateral foot, secondary to footwear injury. The [name], [name] ([id number]) [id number] Page 11 of 13 wounds are dripping edema fluid and he is complaining of pain. There is slough accumulation on all of the open surfaces. Edema control is nonexistent. I used a curette to debride slough and nonviable subcutaneous tissue from his  wounds. We are going to use silver alginate with 3 layer compression. We will apply some periwound triamcinolone to help with inflammation. He indicated that he was willing to comply with our recommendations and keep his wraps on. I cautioned him that we would not be able to accommodate his previous routine of daily or every other day nurse visits due to how busy our clinic is. He said that he understood. He will follow-up in 1 week's time. Electronic Signature(s) Signed: 07/17/2022 4:41:34 PM By: Dellie Catholic RN Signed: 07/18/2022 7:39:56 AM By: Fredirick Maudlin MD FACS Previous Signature: 07/17/2022 9:21:06 AM Version By: Fredirick Maudlin MD FACS Previous Signature: 07/17/2022 9:19:08 AM Version By: Fredirick Maudlin MD FACS Entered By: Dellie Catholic on 07/17/2022 16:41:07 -------------------------------------------------------------------------------- HxROS  Details Patient Name: Date of Service: Clinton Liner RD O. 07/17/2022 8:00 A M Medical Record Number: [id number] Patient Account Number: [id number] Date of Birth/Sex: Treating RN: Aug 26, 1955 (67 y.o. [name] Primary Care Provider: [name] Other Clinician: Referring Provider: Treating Provider/Extender: [name] in Treatment: 0 Information Obtained From Patient Integumentary (Skin) Complaints and Symptoms: Positive for: Wounds Medical History: Negative for: History of Burn Eyes Medical History: Positive for: Cataracts - 2021 Ear/Nose/Mouth/Throat Medical History: Negative for: Chronic sinus problems/congestion; Middle ear problems Hematologic/Lymphatic Medical History: Positive for: Lymphedema Respiratory Medical History: Positive for: Sleep Apnea Past Medical History Notes: Allergic Rhinitis Cardiovascular Medical History: Positive for: Congestive Heart Failure; Coronary Artery Disease; Hypertension Negative for: Deep Vein Thrombosis Past Medical History Notes: AFIB Gastrointestinal Medical History: Negative for: Cirrhosis ; Colitis; Crohns [name], [name] ([id number]) [id number] Page 12 of 13 Endocrine Medical History: Negative for: Type I Diabetes; Type II Diabetes Genitourinary Medical History: Negative for: End Stage Renal Disease Immunological Medical History: Negative for: Raynauds; Scleroderma Musculoskeletal Medical History: Negative for: Gout; Rheumatoid Arthritis; Osteoarthritis; Osteomyelitis Neurologic Medical History: Negative for: Dementia; Neuropathy; Quadriplegia; Paraplegia; Seizure Disorder Oncologic Medical History: Negative for: Received Chemotherapy; Received Radiation Psychiatric Medical History: Positive for: Confinement Anxiety Negative for: Anorexia/bulimia HBO Extended History Items Eyes: Cataracts Immunizations Pneumococcal Vaccine: Received  Pneumococcal Vaccination: Yes Received Pneumococcal Vaccination On or After 60th Birthday: Yes Implantable Devices None Hospitalization / Surgery History Type of Hospitalization/Surgery Inguinal Hernia repair- 2014 insertion of mesh-2014 Atrial ablation surgery- 2007 BIla eyes lasik- 2008 hernia repair- 1977 Family and Social History Cancer: Yes - Father,Mother; Diabetes: No; Heart Disease: No; Hereditary Spherocytosis: No; Hypertension: Yes - Mother,Father; Kidney Disease: No; Lung Disease: No; Seizures: No; Thyroid Problems: No; Tuberculosis: No; Never smoker; Marital Status - Married; Alcohol Use: Never; Drug Use: No History; 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 07/17/2022 Medical Record Number: [id number] Patient Account Number: [id number] Date of Birth/Sex: Treating RN: [birth year] (68 y.o. M) Primary Care Provider: [name] Other Clinician: Referring Provider: Treating Provider/Extender: [name] in Treatment: 0 Diagnosis Coding ICD-10 Codes Code Description [phone] Non-pressure chronic ulcer of other part of left lower leg with fat layer exposed L97.522 Non-pressure chronic ulcer of other part of left foot with fat layer exposed I87.2 Venous insufficiency (chronic) (peripheral) E83.15 Chronic diastolic (congestive) heart failure E66.01  Morbid (severe) obesity due to excess calories I10 Essential (primary)  hypertension Z79.01 Long term (current) use of anticoagulants Facility Procedures : CPT4 Code: 10175102 Description: [place] VISIT-LEV 4 EST PT Modifier: 25 Quantity: 1 : CPT4 Code: 58527782 Description: 42353 - DEB SUBQ TISSUE 20 SQ CM/< ICD-10 Diagnosis Description L97.822 Non-pressure chronic ulcer of other part of left lower leg with fat layer expos L97.522 Non-pressure chronic ulcer of other part of left foot with fat layer exposed Modifier: ed Quantity: 1 : CPT4 Code: 61443154 Description: 00867 - DEB SUBQ TISS EA ADDL 20CM ICD-10 Diagnosis Description L97.822 Non-pressure chronic ulcer of other part of left lower leg with fat layer expos L97.522 Non-pressure chronic ulcer of other part of left foot with fat layer exposed Modifier: ed Quantity: 2 Physician Procedures : CPT4 Code Description Modifier [id number] 26712 - WC PHYS LEVEL 4 - EST PT 25 ICD-10 Diagnosis Description 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d L97.522 Non-pressure chronic ulcer of other part of left  foot with fat layer exposed I87.2 Venous insufficiency (chronic) (peripheral) W58.09 Chronic diastolic (congestive) heart failure Quantity: 1 : [id number] 11042 - WC PHYS SUBQ TISS 20 SQ CM ICD-10 Diagnosis Description 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d L97.522 Non-pressure chronic ulcer of other part of left foot with fat layer exposed Quantity: 1 : [id number] 34193 - WC PHYS SUBQ TISS EA ADDL 20 CM ICD-10 Diagnosis Description 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d L97.522 Non-pressure chronic ulcer of other part of left foot with fat layer exposed Quantity: 2 Electronic Signature(s) Signed: 07/17/2022 4:41:34 PM By: Dellie Catholic RN Signed: 07/18/2022 7:39:56 AM By: Fredirick Maudlin MD FACS Previous Signature: 07/17/2022 9:21:41 AM Version By: Fredirick Maudlin MD FACS Previous Signature: 07/17/2022 9:19:45 AM Version By: Fredirick Maudlin MD FACS Entered By: Dellie Catholic on 07/17/2022 16:38:11

## 2022-07-18 NOTE — Progress Notes (Signed)
DWAN, HEMMELGARN ([id number]) [id number] Page 1 of 14 Visit Report for 07/17/2022 Allergy List Details Patient Name: Date of Service: [name] 07/17/2022 8:00 A M Medical Record Number: [id number] Patient Account Number: [id number] Date of Birth/Sex: Treating RN: July 07, 1956 (67 y.[name] Primary Care Efren Kross: [name] Other Clinician: Referring Katura Eatherly: Treating Achille Xiang/Extender: [name] in Treatment: 0 Allergies Active Allergies hydrocodone Reaction: rash Severity: Moderate Allergy Notes Electronic Signature(s) Signed: 07/17/2022 4:41:34 PM By: Dellie Catholic RN Entered By: Dellie Catholic on 07/17/2022 08:30:05 -------------------------------------------------------------------------------- Arrival Information Details Patient Name: Date of Service: [name] 07/17/2022 8:00 A M Medical Record Number: [id number] Patient Account Number: [id number] Date of Birth/Sex: Treating RN: Aug 14, 1955 (46 y.[name] Primary Care Clayborne Divis: [name] Other Clinician: Referring Ajanay Farve: Treating Hosey Burmester/Extender: [name] in Treatment: 0 Visit Information Patient Arrived: Ambulatory Arrival Time: 08:10 Accompanied By: self Transfer Assistance: None Patient Identification Verified: Yes Patient Has Alerts: Yes Patient Alerts: Takes AC-Eliquis History Since Last Visit Added or deleted any medications: No Any new allergies or adverse reactions: No Had a fall or experienced change in activities of daily living that may affect risk of falls: No Signs or symptoms of abuse/neglect since last visito No Hospitalized since last visit: No Implantable device outside of the clinic excluding cellular tissue based products placed in the center since last visit: No Electronic Signature(s) Signed: 07/17/2022 4:41:34 PM By: Dellie Catholic RN Entered By:  Dellie Catholic on 07/17/2022 08:20:07 [name] ([id number]) [id number] Page 2 of 14 -------------------------------------------------------------------------------- Clinic Level of Care Assessment Details Patient Name: Date of Service: [name] 07/17/2022 8:00 A M Medical Record Number: [id number] Patient Account Number: [id number] Date of Birth/Sex: Treating RN: 1956-01-06 (66 y.[name] Primary Care Lety Cullens: [name] Other Clinician: Referring Zachari Alberta: Treating Derrel Moore/Extender: [name] in Treatment: 0 Clinic Level of Care Assessment Items TOOL 1 Quantity Score X- 1 0 Use when EandM and Procedure is performed on INITIAL visit ASSESSMENTS - Nursing Assessment / Reassessment X- 1 20 General Physical Exam (combine w/ comprehensive assessment (listed just below) when performed on new pt. evals) X- 1 25 Comprehensive Assessment (HX, ROS, Risk Assessments, Wounds Hx, etc.) ASSESSMENTS - Wound and Skin Assessment / Reassessment X- 1 10 Dermatologic / Skin Assessment (not related to wound area) ASSESSMENTS - Ostomy and/or Continence Assessment and Care '[]'$  - 0 Incontinence Assessment and Management '[]'$  - 0 Ostomy Care Assessment and Management (repouching, etc.) PROCESS - Coordination of Care X - Simple Patient / Family Education for ongoing care 1 15 '[]'$  - 0 Complex (extensive) Patient / Family Education for ongoing care X- 1 10 Staff obtains Programmer, systems, Records, T Results / Process Orders est X- 1 10 Staff telephones HHA, Nursing Homes / Clarify orders / etc '[]'$  - 0 Routine Transfer to another Facility (non-emergent condition) '[]'$  - 0 Routine Hospital Admission (non-emergent condition) X- 1 15 New Admissions / Biomedical engineer / Ordering NPWT Apligraf, etc. , '[]'$  - 0 Emergency Hospital Admission (emergent condition) PROCESS - Special Needs '[]'$  - 0 Pediatric / Minor Patient  Management '[]'$  - 0 Isolation Patient Management '[]'$  - 0 Hearing / Language / Visual special needs '[]'$  - 0 Assessment of Community assistance (transportation, D/C planning, etc.) '[]'$  - 0 Additional assistance / Altered mentation '[]'$  - 0 Support Surface(s) Assessment (bed, cushion, seat, etc.) INTERVENTIONS - Miscellaneous '[]'$  - 0 External ear  exam '[]'$  - 0 Patient Transfer (multiple staff / Civil Service fast streamer / Similar devices) '[]'$  - 0 Simple Staple / Suture removal (25 or less) '[]'$  - 0 Complex Staple / Suture removal (26 or more) '[]'$  - 0 Hypo/Hyperglycemic Management (do not check if billed separately) X- 1 15 Ankle / Brachial Index (ABI) - do not check if billed separately Has the patient been seen at the hospital within the last three years: Yes Total Score: 120 Level Of Care: New/Established - Level 4 Electronic Signature(s) Signed: 07/17/2022 4:41:34 PM By: Dellie Catholic RN [name], [name] ([id number]) By: Dellie Catholic RN [phone].pdf Page 3 of 14 Signed: 07/17/2022 4:41:34 PM Entered By: Dellie Catholic on 07/17/2022 16:37:57 -------------------------------------------------------------------------------- Compression Therapy Details Patient Name: Date of Service: [name] 07/17/2022 8:00 A M Medical Record Number: [id number] Patient Account Number: [id number] Date of Birth/Sex: Treating RN: [birth year] (50 y.[name] Primary Care Aleisha Paone: [name] Other Clinician: Referring Muad Noga: Treating Markeem Noreen/Extender: [name] in Treatment: 0 Compression Therapy Performed for Wound Assessment: Wound #3 Left,Anterior Lower Leg Performed By: Clinician Dellie Catholic, RN Compression Type: Three Layer Post Procedure Diagnosis Same as Pre-procedure Electronic Signature(s) Signed: 07/17/2022 4:41:34 PM By: Dellie Catholic RN Entered By: Dellie Catholic on 07/17/2022  11:45:46 -------------------------------------------------------------------------------- Compression Therapy Details Patient Name: Date of Service: [name] 07/17/2022 8:00 A M Medical Record Number: [id number] Patient Account Number: [id number] Date of Birth/Sex: Treating RN: 11-19-1955 (89 y.[name] Primary Care Shaniya Tashiro: [name] Other Clinician: Referring Saunders [place]: Treating Mazel Villela/Extender: [name] in Treatment: 0 Compression Therapy Performed for Wound Assessment: Wound #4 Left,Lateral Foot Performed By: Clinician Dellie Catholic, RN Compression Type: Three Layer Post Procedure Diagnosis Same as Pre-procedure Electronic Signature(s) Signed: 07/17/2022 4:41:34 PM By: Dellie Catholic RN Entered By: Dellie Catholic on 07/17/2022 11:45:46 -------------------------------------------------------------------------------- Compression Therapy Details Patient Name: Date of Service: [name] 07/17/2022 8:00 A M Medical Record Number: [id number] Patient Account Number: [id number] Date of Birth/Sex: Treating RN: July 30, 1955 (87 y.[name] Primary Care Eloina Ergle: [name] Other Clinician: Referring Jackee Glasner: Treating Zayaan Kozak/Extender: [name] in Treatment: 0 Compression Therapy Performed for Wound Assessment: Wound #5 Left,Lateral Ankle VINCEN, BEJAR ([id number]) [phone].pdf Page 4 of 14 Performed By: Clinician Dellie Catholic, RN Compression Type: Three Layer Post Procedure Diagnosis Same as Pre-procedure Electronic Signature(s) Signed: 07/17/2022 4:41:34 PM By: Dellie Catholic RN Entered By: Dellie Catholic on 07/17/2022 11:45:46 -------------------------------------------------------------------------------- Encounter Discharge Information Details Patient Name: Date of Service: [name] 07/17/2022 8:00 A M Medical  Record Number: [id number] Patient Account Number: [id number] Date of Birth/Sex: Treating RN: [birth year] (53 y.[name] Primary Care Maleeka Sabatino: [name] Other Clinician: Referring Jaidan Stachnik: Treating Karel Turpen/Extender: [name] in Treatment: 0 Encounter Discharge Information Items Post Procedure Vitals Discharge Condition: Stable Temperature (F): 97.9 Ambulatory Status: Ambulatory Pulse (bpm): 73 Discharge Destination: Home Respiratory Rate (breaths/min): 18 Transportation: Private Auto Blood Pressure (mmHg): 135/81 Accompanied By: self Schedule Follow-up Appointment: Yes Clinical Summary of Care: Patient Declined Electronic Signature(s) Signed: 07/17/2022 4:41:34 PM By: Dellie Catholic RN Entered By: Dellie Catholic on 07/17/2022 16:39:18 -------------------------------------------------------------------------------- Lower Extremity Assessment Details Patient Name: Date of Service: [name] 07/17/2022 8:00 A M Medical Record Number: [id number] Patient Account Number: [id number] Date of Birth/Sex: Treating RN: 21-Oct-1955 (89 y.[name] Primary Care Delvon Chipps: [name] Other Clinician: Referring  Antwaine Boomhower: Treating Yeraldin Litzenberger/Extender: [name] in Treatment: 0 Edema Assessment Assessed: [Left: Yes] [Right: No] [Left: Edema] [Right: :] Calf Left: Right: Point of Measurement: From Medial Instep 44 cm Ankle Left: Right: Point of Measurement: From Medial Instep 30 cm Vascular Assessment [name], [name] ([id number]) [[id number] Page 5 of 14] Pulses: Dorsalis Pedis Palpable: [Left:Yes] Blood Pressure: Brachial: [Left:135] Ankle: [Left:Dorsalis Pedis: 148 1.10] Electronic Signature(s) Signed: 07/17/2022 4:41:34 PM By: Dellie Catholic RN Entered By: Dellie Catholic on 07/17/2022  08:35:30 -------------------------------------------------------------------------------- Multi Wound Chart Details Patient Name: Date of Service: [name] 07/17/2022 8:00 A M Medical Record Number: [id number] Patient Account Number: [id number] Date of Birth/Sex: Treating RN: February 17, 1956 (49 y.[name] M) Primary Care Herbie Lehrmann: [name] Other Clinician: Referring Jenita Rayfield: Treating Blaine Hari/Extender: [name] in Treatment: 0 Vital Signs Height(in): 76 Pulse(bpm): 24 Weight(lbs): 326 Blood Pressure(mmHg): 135/81 Body Mass Index(BMI): 39.7 Temperature(F): 97.9 Respiratory Rate(breaths/min): 18 [3:Photos:] Left, Anterior Lower Leg Left, Lateral Foot Left, Lateral Ankle Wound Location: Gradually Appeared Footwear Injury Footwear Injury Wounding Event: Venous Leg Ulcer Abrasion Abrasion Primary Etiology: Cataracts, Lymphedema, Sleep Cataracts, Lymphedema, Sleep Cataracts, Lymphedema, Sleep Comorbid History: Apnea, Congestive Heart Failure, Apnea, Congestive Heart Failure, Apnea, Congestive Heart Failure, Coronary Artery Disease, Coronary Artery Disease, Coronary Artery Disease, Hypertension, Confinement Anxiety Hypertension, Confinement Anxiety Hypertension, Confinement Anxiety 05/12/2022 07/02/2022 07/02/2022 Date Acquired: 0 0 0 Weeks of Treatment: Open Open Open Wound Status: No No No Wound Recurrence: 20x19x0.1 2.5x1.5x0.1 1x0.2x0.1 Measurements L x W x D (cm) 298.451 2.945 0.157 A (cm) : rea 29.845 0.295 0.016 Volume (cm) : Full Thickness Without Exposed Full Thickness Without Exposed Full Thickness Without Exposed Classification: Support Structures Support Structures Support Structures Medium Small Small Exudate A mount: Serous Serous Serous Exudate Type: amber amber amber Exudate Color: Medium (34-66%) Small (1-33%) Medium (34-66%) Granulation A mount: Pink Pink Red Granulation Quality: Medium (34-66%) Large  (67-100%) Medium (34-66%) Necrotic A mount: Fat Layer (Subcutaneous Tissue): Yes Fat Layer (Subcutaneous Tissue): Yes Fat Layer (Subcutaneous Tissue): Yes Exposed Structures: Fascia: No Fascia: No Tendon: No Tendon: No Muscle: No Muscle: No Joint: No Joint: No Bone: No Bone: No None None N/A Epithelialization: Debridement - Excisional Debridement - Excisional Debridement - Excisional Debridement: Pre-procedure Verification/Time Out 08:56 08:56 08:56 [name], [name] ([id number]) [id number] Page 6 of 14 Taken: Lidocaine 4% T opical Solution Lidocaine 4% Topical Solution Lidocaine 4% Topical Solution Pain Control: Subcutaneous, Slough Subcutaneous, Slough Subcutaneous, Slough Tissue Debrided: Skin/Subcutaneous Tissue Skin/Subcutaneous Tissue Skin/Subcutaneous Tissue Level: 380 3.75 0.2 Debridement A (sq cm): rea Electrocautery Curette Curette Instrument: Minimum Minimum Minimum Bleeding: Pressure Pressure Pressure Hemostasis A chieved: 0 0 0 Procedural Pain: 0 0 0 Post Procedural Pain: Procedure was tolerated well Procedure was tolerated well Procedure was tolerated well Debridement Treatment Response: 20x19x0.1 2.5x1.5x0.1 1x0.2x0.1 Post Debridement Measurements L x W x D (cm) 29.845 0.295 0.016 Post Debridement Volume: (cm) Scarring: Yes No Abnormalities Noted No Abnormalities Noted Periwound Skin Texture: Maceration: Yes No Abnormalities Noted No Abnormalities Noted Periwound Skin Moisture: Hemosiderin Staining: Yes No Abnormalities Noted No Abnormalities Noted Periwound Skin Color: No Abnormality No Abnormality No Abnormality Temperature: Debridement N/A N/A Procedures Performed: Treatment Notes Electronic Signature(s) Signed: 07/17/2022 9:11:43 AM By: Fredirick Maudlin MD FACS Entered By: Fredirick Maudlin on 07/17/2022 09:11:42 -------------------------------------------------------------------------------- Multi-Disciplinary  Care Plan Details Patient Name: Date of Service: [name] 07/17/2022 8:00 A M Medical Record Number: [id number] Patient Account Number: [id number] Date of Birth/Sex: Treating RN: April 22, 1956 (  64 y.[name] Primary Care Jacobi Ryant: [name] Other Clinician: Referring Priscille Shadduck: Treating Ronette Hank/Extender: [name] in Treatment: 0 Active Inactive Wound/Skin Impairment Nursing Diagnoses: Knowledge deficit related to ulceration/compromised skin integrity Goals: Patient/caregiver will verbalize understanding of skin care regimen Date Initiated: 07/17/2022 Target Resolution Date: 11/04/2022 Goal Status: Active Interventions: Assess ulceration(s) every visit Treatment Activities: Skin care regimen initiated : 07/17/2022 Notes: Electronic Signature(s) Signed: 07/17/2022 4:41:34 PM By: Dellie Catholic RN Entered By: Dellie Catholic on 07/17/2022 16:33:02 [name] ([id number]) [id number] Page 7 of 14 -------------------------------------------------------------------------------- Pain Assessment Details Patient Name: Date of Service: [name] 07/17/2022 8:00 A M Medical Record Number: [id number] Patient Account Number: [id number] Date of Birth/Sex: Treating RN: 01-Jan-1956 (34 y.[name] Primary Care Donette Mainwaring: [name] Other Clinician: Referring Sonia Bromell: Treating Nevea Spiewak/Extender: [name] in Treatment: 0 Active Problems Location of Pain Severity and Description of Pain Patient Has Paino Yes Site Locations Pain Location: Pain in Ulcers With Dressing Change: No Duration of the Pain. Constant / Intermittento Constant Rate the pain. Current Pain Level: 9 Worst Pain Level: 10 Least Pain Level: 9 Tolerable Pain Level: 9 Character of Pain Describe the Pain: Sharp Pain Management and Medication Current Pain Management: Medication:  Yes Cold Application: No Rest: Yes Massage: No Activity: No T.E.N.S.: No Heat Application: No Leg drop or elevation: No Is the Current Pain Management Adequate: Adequate Electronic Signature(s) Signed: 07/17/2022 4:41:34 PM By: Dellie Catholic RN Entered By: Dellie Catholic on 07/17/2022 09:02:22 -------------------------------------------------------------------------------- Patient/Caregiver Education Details Patient Name: Date of Service: [name] 1/11/2024andnbsp8:00 A M Medical Record Number: [id number] Patient Account Number: [id number] Date of Birth/Gender: Treating RN: 1955-10-10 (44 y.[name] Primary Care Physician: [name] Other Clinician: Referring Physician: Treating Physician/Extender: [name] in Treatment: 0 Education Assessment Education Provided To: Patient Education Topics Provided Wound/Skin Impairment: Methods: Explain/Verbal CALEM, COCOZZA ([id number]) [id number] Page 8 of 14 Responses: Return demonstration correctly Electronic Signature(s) Signed: 07/17/2022 4:41:34 PM By: Dellie Catholic RN Entered By: Dellie Catholic on 07/17/2022 16:33:53 -------------------------------------------------------------------------------- Wound Assessment Details Patient Name: Date of Service: [name] 07/17/2022 8:00 A M Medical Record Number: [id number] Patient Account Number: [id number] Date of Birth/Sex: Treating RN: Nov 11, 1955 (65 y.[name] Primary Care Emmaus Brandi: [name] Other Clinician: Referring Cain Fitzhenry: Treating Oney Tatlock/Extender: [name] in Treatment: 0 Wound Status Wound Number: 3 Primary Venous Leg Ulcer Etiology: Wound Location: Left, Anterior Lower Leg Wound Open Wounding Event: Gradually Appeared Status: Date Acquired: 05/12/2022 Comorbid Cataracts, Lymphedema, Sleep Apnea, Congestive Heart  Failure, Weeks Of Treatment: 0 History: Coronary Artery Disease, Hypertension, Confinement Anxiety Clustered Wound: No Photos Wound Measurements Length: (cm) 20 Width: (cm) 19 Depth: (cm) 0.1 Area: (cm) 298.451 Volume: (cm) 29.845 % Reduction in Area: % Reduction in Volume: Epithelialization: None Tunneling: No Undermining: No Wound Description Classification: Full Thickness Without Exposed Support Exudate Amount: Medium Exudate Type: Serous Exudate Color: amber Structures Foul Odor After Cleansing: No Slough/Fibrino Yes Wound Bed Granulation Amount: Medium (34-66%) Exposed Structure Granulation Quality: Pink Fat Layer (Subcutaneous Tissue) Exposed: Yes Necrotic Amount: Medium (34-66%) Necrotic Quality: Adherent Slough Periwound Skin Texture Texture Color No Abnormalities Noted: No No Abnormalities Noted: No Scarring: Yes Hemosiderin Staining: Yes Moisture Temperature / Pain No Abnormalities Noted: No Temperature: No Abnormality MacerationHILLIARD, [name] ([id number]) [id number] Page 9 of 14 Treatment Notes Wound #3 (Lower Leg) Wound Laterality:  Left, Anterior Cleanser Soap and Water Discharge Instruction: May shower and wash wound with dial antibacterial soap and water prior to dressing change. Wound Cleanser Discharge Instruction: Cleanse the wound with wound cleanser prior to applying a clean dressing using gauze sponges, not tissue or cotton balls. Peri-Wound Care Triamcinolone 15 (g) Discharge Instruction: Use triamcinolone 15 (g) as directed Topical Primary Dressing Sorbalgon AG Dressing 6x6 (in/in) Discharge Instruction: Apply to wound bed as instructed Secondary Dressing Woven Gauze Sponge, Non-Sterile 4x4 in Discharge Instruction: Apply over primary dressing as directed. Zetuvit Plus 4x8 in Discharge Instruction: Apply over primary dressing as directed. Secured With Compression Wrap Compression  Stockings Environmental education officer) Signed: 07/17/2022 4:41:34 PM By: Dellie Catholic RN Entered By: Dellie Catholic on 07/17/2022 09:08:50 -------------------------------------------------------------------------------- Wound Assessment Details Patient Name: Date of Service: [name] 07/17/2022 8:00 A M Medical Record Number: [id number] Patient Account Number: [id number] Date of Birth/Sex: Treating RN: 1956/01/07 (62 y.[name] Primary Care Heith Haigler: [name] Other Clinician: Referring Jeanmarie Mccowen: Treating Rozelle Caudle/Extender: [name] in Treatment: 0 Wound Status Wound Number: 4 Primary Abrasion Etiology: Wound Location: Left, Lateral Foot Wound Open Wounding Event: Footwear Injury Status: Date Acquired: 07/02/2022 Comorbid Cataracts, Lymphedema, Sleep Apnea, Congestive Heart Failure, Weeks Of Treatment: 0 History: Coronary Artery Disease, Hypertension, Confinement Anxiety Clustered Wound: No Photos [name], [name] ([id number]) [id number] Page 10 of 14 Wound Measurements Length: (cm) 2.5 Width: (cm) 1.5 Depth: (cm) 0.1 Area: (cm) 2.945 Volume: (cm) 0.295 % Reduction in Area: % Reduction in Volume: Epithelialization: None Tunneling: No Undermining: No Wound Description Classification: Full Thickness Without Exposed Support Structures Exudate Amount: Small Exudate Type: Serous Exudate Color: amber Foul Odor After Cleansing: No Slough/Fibrino Yes Wound Bed Granulation Amount: Small (1-33%) Exposed Structure Granulation Quality: Pink Fascia Exposed: No Necrotic Amount: Large (67-100%) Fat Layer (Subcutaneous Tissue) Exposed: Yes Necrotic Quality: Adherent Slough Tendon Exposed: No Muscle Exposed: No Joint Exposed: No Bone Exposed: No Periwound Skin Texture Texture Color No Abnormalities Noted: Yes No Abnormalities Noted: Yes Moisture Temperature / Pain No  Abnormalities Noted: Yes Temperature: No Abnormality Treatment Notes Wound #4 (Foot) Wound Laterality: Left, Lateral Cleanser Soap and Water Discharge Instruction: May shower and wash wound with dial antibacterial soap and water prior to dressing change. Wound Cleanser Discharge Instruction: Cleanse the wound with wound cleanser prior to applying a clean dressing using gauze sponges, not tissue or cotton balls. Peri-Wound Care Triamcinolone 15 (g) Discharge Instruction: Use triamcinolone 15 (g) as directed Topical Primary Dressing Sorbalgon AG Dressing 2x2 (in/in) Discharge Instruction: Apply to wound bed as instructed Secondary Dressing Secured With Compression Wrap ThreePress (3 layer compression wrap) Discharge Instruction: Apply three layer compression as directed. Compression Stockings Add-Ons Electronic Signature(s) [name], [name] ([id number]) [id number] Page 11 of 14 Signed: 07/17/2022 4:41:34 PM By: Dellie Catholic RN Entered By: Dellie Catholic on 07/17/2022 08:46:10 -------------------------------------------------------------------------------- Wound Assessment Details Patient Name: Date of Service: [name] 07/17/2022 8:00 A M Medical Record Number: [id number] Patient Account Number: [id number] Date of Birth/Sex: Treating RN: 22-Jan-1956 (43 y.[name] Primary Care Alphons Burgert: [name] Other Clinician: Referring Alvetta Hidrogo: Treating Demetruis Depaul/Extender: [name] in Treatment: 0 Wound Status Wound Number: 5 Primary Abrasion Etiology: Wound Location: Left, Lateral Ankle Wound Open Wounding Event: Footwear Injury Status: Date Acquired: 07/02/2022 Comorbid Cataracts, Lymphedema, Sleep Apnea, Congestive Heart Failure, Weeks Of Treatment: 0 History: Coronary Artery Disease, Hypertension, Confinement Anxiety Clustered Wound: No Photos Wound Measurements Length: (cm)  1 Width: (cm)  0.2 Depth: (cm) 0.1 Area: (cm) 0.157 Volume: (cm) 0.016 % Reduction in Area: % Reduction in Volume: Tunneling: No Undermining: No Wound Description Classification: Full Thickness Without Exposed Support Exudate Amount: Small Exudate Type: Serous Exudate Color: amber Structures Foul Odor After Cleansing: No Slough/Fibrino Yes Wound Bed Granulation Amount: Medium (34-66%) Exposed Structure Granulation Quality: Red Fascia Exposed: No Necrotic Amount: Medium (34-66%) Fat Layer (Subcutaneous Tissue) Exposed: Yes Necrotic Quality: Adherent Slough Tendon Exposed: No Muscle Exposed: No Joint Exposed: No Bone Exposed: No Periwound Skin Texture Texture Color No Abnormalities Noted: Yes No Abnormalities Noted: Yes Moisture Temperature / Pain No Abnormalities Noted: Yes Temperature: No Abnormality Treatment Notes Wound #5 (Ankle) Wound Laterality: Left, Lateral [name], [name] ([id number]) [id number] Page 12 of 14 Cleanser Soap and Water Discharge Instruction: May shower and wash wound with dial antibacterial soap and water prior to dressing change. Wound Cleanser Discharge Instruction: Cleanse the wound with wound cleanser prior to applying a clean dressing using gauze sponges, not tissue or cotton balls. Peri-Wound Care Triamcinolone 15 (g) Discharge Instruction: Use triamcinolone 15 (g) as directed Topical Primary Dressing Sorbalgon AG Dressing 2x2 (in/in) Discharge Instruction: Apply to wound bed as instructed Secondary Dressing Woven Gauze Sponge, Non-Sterile 4x4 in Discharge Instruction: Apply over primary dressing as directed. Zetuvit Plus 4x8 in Discharge Instruction: Apply over primary dressing as directed. Secured With Compression Wrap ThreePress (3 layer compression wrap) Discharge Instruction: Apply three layer compression as directed. Compression Stockings Add-Ons Electronic Signature(s) Signed: 07/17/2022 4:41:34 PM By:  Dellie Catholic RN Entered By: Dellie Catholic on 07/17/2022 08:46:48 -------------------------------------------------------------------------------- Wound Assessment Details Patient Name: Date of Service: [name] 07/17/2022 8:00 A M Medical Record Number: [id number] Patient Account Number: [id number] Date of Birth/Sex: Treating RN: 1956-05-27 (88 y.[name] Primary Care Donique Hammonds: [name] Other Clinician: Referring Karam Dunson: Treating Sianna Garofano/Extender: [name] in Treatment: 0 Wound Status Wound Number: 6 Primary Trauma, Other Etiology: Wound Location: Left Hand - 5th Digit Wound Open Wounding Event: Laceration Status: Date Acquired: 07/17/2022 Comorbid Cataracts, Lymphedema, Sleep Apnea, Congestive Heart Failure, Weeks Of Treatment: 0 History: Coronary Artery Disease, Hypertension, Confinement Anxiety Clustered Wound: No Photos [name], [name] ([id number]) [id number] Page 13 of 14 Wound Measurements Length: (cm) 0.5 Width: (cm) 0.1 Depth: (cm) 0.1 Area: (cm) 0.039 Volume: (cm) 0.004 % Reduction in Area: % Reduction in Volume: Tunneling: No Undermining: No Wound Description Classification: Partial Thickness Exudate Amount: Small Exudate Type: Sanguinous Exudate Color: red Foul Odor After Cleansing: No Wound Bed Granulation Amount: Large (67-100%) Granulation Quality: Red Necrotic Amount: None Present (0%) Periwound Skin Texture Texture Color No Abnormalities Noted: No No Abnormalities Noted: No Moisture No Abnormalities Noted: No Treatment Notes Wound #6 (Hand - 5th Digit) Wound Laterality: Left Cleanser Peri-Wound Care Topical Primary Dressing Secondary Dressing Bordered Gauze, 2x2 in Discharge Instruction: Apply over primary dressing as directed. Secured With Compression Wrap Compression Stockings Environmental education officer) Signed: 07/17/2022 4:41:34 PM  By: Dellie Catholic RN Entered By: Dellie Catholic on 07/17/2022 16:40:49 -------------------------------------------------------------------------------- Vitals Details Patient Name: Date of Service: [name] 07/17/2022 8:00 A M Medical Record Number: [id number] Patient Account Number: [id number] Date of Birth/Sex: Treating RN: [birth year] (77 y.[name] Primary Care Hayli Milligan: [name] Other Clinician: Referring Trevione Wert: Treating Makita Blow/Extender: [name] in Treatment: 0 Vital Signs Time Taken: 08:11 Temperature (F): 97.9 Height (in): 76 Pulse (bpm): 73 Weight (lbs): 326 Respiratory Rate (breaths/min): 18  Body Mass Index (BMI): 39.7 Blood Pressure (mmHg): 135/81 [name], [name] ([id number]) [id number].pdf Page 14 of 14 Reference Range: 80 - 120 mg / dl Electronic Signature(s) Signed: 07/17/2022 4:41:34 PM By: Dellie Catholic RN Entered By: Dellie Catholic on 07/17/2022 08:20:43

## 2022-07-18 NOTE — Progress Notes (Signed)
[name] ([id number]) [id number] Page 1 of 4 Visit Report for 07/17/2022 Abuse Risk Screen Details Patient Name: Date of Service: [name] 07/17/2022 8:00 A M Medical Record Number: [id number] Patient Account Number: [id number] Date of Birth/Sex: Treating RN: Mar 17, 1956 (67 y.[name] Primary Care [name]: [name] Other Clinician: Referring [name]: Treating [name]/Extender: [name] in Treatment: 0 Abuse Risk Screen Items Answer ABUSE RISK SCREEN: Has anyone close to you tried to hurt or harm you recentlyo No Do you feel uncomfortable with anyone in your familyo No Electronic Signature(s) Signed: 07/17/2022 4:41:34 PM By: Clinton Catholic RN Entered By: [name] on 07/17/2022 08:33:29 -------------------------------------------------------------------------------- Activities of Daily Living Details Patient Name: Date of Service: [name] 07/17/2022 8:00 A M Medical Record Number: [id number] Patient Account Number: [id number] Date of Birth/Sex: Treating RN: [birth year] (89 y.[name] Primary Care Jasyn Mey: [name] Other Clinician: Referring [name]: Treating [name]/Extender: [name] in Treatment: 0 Activities of Daily Living Items Answer Activities of Daily Living (Please select one for each item) Drive Automobile Completely Able T Medications ake Completely Able Use T elephone Completely Able Care for Appearance Completely Able Use T oilet Completely Able Bath / Shower Completely Able Dress Self Completely Able Feed Self Completely Able Walk Completely Able Get In / Out Bed Completely Able Housework Completely Able Prepare Meals Completely [place] for Self Completely Able Electronic Signature(s) Signed: 07/17/2022 4:41:34 PM By: Clinton Catholic RN Entered By:  [name] on 07/17/2022 08:33:55 [name] ([id number]) [id number] Page 2 of 4 -------------------------------------------------------------------------------- Education Screening Details Patient Name: Date of Service: [name] 07/17/2022 8:00 A M Medical Record Number: [id number] Patient Account Number: [id number] Date of Birth/Sex: Treating RN: April 21, 1956 (44 y.[name] Primary Care [name]: [name] Other Clinician: Referring [name]: Treating [name]/Extender: [name] in Treatment: 0 Primary Learner Assessed: Patient Learning Preferences/Education Level/Primary Language Learning Preference: Explanation, Demonstration, Printed Material Highest Education Level: College or Above Preferred Language: English Cognitive Barrier Language Barrier: No Translator Needed: No Memory Deficit: No Emotional Barrier: No Cultural/Religious Beliefs Affecting Medical Care: No Physical Barrier Impaired Vision: No Impaired Hearing: No Decreased Hand dexterity: No Knowledge/Comprehension Knowledge Level: High Comprehension Level: High Ability to understand written instructions: High Ability to understand verbal instructions: High Motivation Anxiety Level: Calm Cooperation: Cooperative Education Importance: Acknowledges Need Interest in Health Problems: Asks Questions Perception: Coherent Willingness to Engage in Self-Management High Activities: Readiness to Engage in Self-Management High Activities: Electronic Signature(s) Signed: 07/17/2022 4:41:34 PM By: Clinton Catholic RN Entered By: [name] on 07/17/2022 08:34:22 -------------------------------------------------------------------------------- Fall Risk Assessment Details Patient Name: Date of Service: [name] 07/17/2022 8:00 A M Medical Record Number: [id number] Patient Account Number: [id number] Date  of Birth/Sex: Treating RN: Mar 04, 1956 (51 y.[name] Primary Care [name]: [name] Other Clinician: Referring [name]: Treating [name]/Extender: [name] in Treatment: 0 Fall Risk Assessment Items Have you had 2 or more falls in the last [address] monthso 0 No [name], [name] ([id number]) [id number] Page 3 of 4 Have you had any fall that resulted in injury in the last 12 monthso 0 No FALLS RISK SCREEN History of falling - immediate or within 3 months 0 No Secondary diagnosis (Do you have 2 or more medical diagnoseso) 0 No Ambulatory aid None/bed rest/wheelchair/nurse 0 No  Crutches/cane/walker 0 No Furniture 0 No Intravenous therapy Access/Saline/Heparin Lock 0 No Gait/Transferring Normal/ bed rest/ wheelchair 0 No Weak (short steps with or without shuffle, stooped but able to lift head while walking, may seek 0 No support from furniture) Impaired (short steps with shuffle, may have difficulty arising from chair, head down, impaired 0 No balance) Mental Status Oriented to own ability 0 No Electronic Signature(s) Signed: 07/17/2022 4:41:34 PM By: Clinton Catholic RN Entered By: [name] on 07/17/2022 08:34:28 -------------------------------------------------------------------------------- Foot Assessment Details Patient Name: Date of Service: [name] 07/17/2022 8:00 A M Medical Record Number: [id number] Patient Account Number: [id number] Date of Birth/Sex: Treating RN: Dec 03, 1955 (80 y.[name] Primary Care [name]: [name] Other Clinician: Referring [name]: Treating [name]/Extender: [name] in Treatment: 0 Foot Assessment Items Site Locations + = Sensation present, - = Sensation absent, C = Callus, U = Ulcer R = Redness, W = Warmth, M = Maceration, PU = Pre-ulcerative lesion F = Fissure, S = Swelling, D =  Dryness Assessment Right: Left: Other Deformity: No No Prior Foot Ulcer: No No Prior Amputation: No No Charcot Joint: No No Ambulatory Status: Ambulatory Without Help [name] ([id number]) [phone] [id number] Page 4 of 4 Electronic Signature(s) Signed: 07/17/2022 4:41:34 PM By: Clinton Catholic RN Entered By: [name] on 07/17/2022 08:35:03 -------------------------------------------------------------------------------- Nutrition Risk Screening Details Patient Name: Date of Service: [name] 07/17/2022 8:00 A M Medical Record Number: [id number] Patient Account Number: [id number] Date of Birth/Sex: Treating RN: [birth year] (61 y.[name] Primary Care Sisto Granillo: [name] Other Clinician: Referring Zeplin Aleshire: Treating Itzael Liptak/Extender: [name] in Treatment: 0 Height (in): 76 Weight (lbs): 326 Body Mass Index (BMI): 39.7 Nutrition Risk Screening Items Score Screening NUTRITION RISK SCREEN: I have an illness or condition that made me change the kind and/or amount of food I eat 0 No I eat fewer than two meals per day 0 No I eat few fruits and vegetables, or milk products 0 No I have three or more drinks of beer, liquor or wine almost every day 0 No I have tooth or mouth problems that make it hard for me to eat 0 No I don't always have enough money to buy the food I need 0 No I eat alone most of the time 0 No I take three or more different prescribed or over-the-counter drugs a day 0 No Without wanting to, I have lost or gained 10 pounds in the last six months 0 No I am not always physically able to shop, cook and/or feed myself 0 No Nutrition Protocols Good Risk Protocol 0 No interventions needed Moderate Risk Protocol High Risk Proctocol Risk Level: Good Risk Score: 0 Electronic Signature(s) Signed: 07/17/2022 4:41:34 PM By: Clinton Catholic RN Entered By: [name] on 07/17/2022 08:34:37

## 2022-07-24 ENCOUNTER — Encounter (HOSPITAL_BASED_OUTPATIENT_CLINIC_OR_DEPARTMENT_OTHER): Payer: Medicare Other | Admitting: General Surgery

## 2022-07-24 DIAGNOSIS — L97822 Non-pressure chronic ulcer of other part of left lower leg with fat layer exposed: Secondary | ICD-10-CM | POA: Diagnosis not present

## 2022-07-24 NOTE — Progress Notes (Signed)
[name], [name] ([id number]) [id number] Page 1 of 12 Visit Report for 07/24/2022 Chief Complaint Document Details Patient Name: Date of Service: Clinton Liner RD O. 07/24/2022 8:00 A M Medical Record Number: [id number] Patient Account Number: [id number] Date of Birth/Sex: Treating RN: [birth year] (67 y.o. M) Primary Care Provider: [name] Other Clinician: Referring Provider: Treating Provider/Extender: [name] in Treatment: 1 Information Obtained from: Patient Chief Complaint Patient presents for treatment of an open ulcer due to venous insufficiency Electronic Signature(s) Signed: 07/24/2022 9:15:46 AM By: Fredirick Maudlin MD FACS Entered By: Fredirick Maudlin on 07/24/2022 09:15:46 -------------------------------------------------------------------------------- Debridement Details Patient Name: Date of Service: Clinton Liner RD O. 07/24/2022 8:00 A M Medical Record Number: [id number] Patient Account Number: [id number] Date of Birth/Sex: Treating RN: 1955-09-30 (67 y.o. M) Primary Care Provider: [name] Other Clinician: Referring Provider: Treating Provider/Extender: [name] in Treatment: 1 Debridement Performed for Assessment: Wound #3 Left,Anterior Lower Leg Performed By: Physician Fredirick Maudlin, MD Debridement Type: Debridement Severity of Tissue Pre Debridement: Fat layer exposed Level of Consciousness (Pre-procedure): Awake and Alert Pre-procedure Verification/Time Out Yes - 08:35 Taken: Start Time: 08:35 Pain Control: Lidocaine 5% topical ointment T Area Debrided (L x W): otal 10 (cm) x 12 (cm) = 120 (cm) Tissue and other material debrided: Non-Viable, Slough, Slough Level: Non-Viable Tissue Debridement Description: Selective/Open Wound Instrument: Curette Bleeding: Minimum Hemostasis Achieved: Pressure End Time: 08:36 Procedural Pain: 0 Response to  Treatment: Procedure was tolerated well Level of Consciousness (Post- Awake and Alert procedure): Post Debridement Measurements of Total Wound Length: (cm) 10 Width: (cm) 12 Depth: (cm) 0.1 Volume: (cm) 9.425 Character of Wound/Ulcer Post Debridement: Improved Severity of Tissue Post Debridement: Fat layer exposed [name] ([id number]) [phone].pdf Page 2 of 12 Post Procedure Diagnosis Same as Pre-procedure Notes Scribed for Dr. Celine Ahr by J.Scotton Electronic Signature(s) Signed: 07/24/2022 9:19:49 AM By: Fredirick Maudlin MD FACS Entered By: Fredirick Maudlin on 07/24/2022 09:19:49 -------------------------------------------------------------------------------- HPI Details Patient Name: Date of Service: Clinton Liner RD O. 07/24/2022 8:00 A M Medical Record Number: [id number] Patient Account Number: [id number] Date of Birth/Sex: Treating RN: 24-May-1956 (67 y.o. M) Primary Care Provider: [name] Other Clinician: Referring Provider: Treating Provider/Extender: [name] in Treatment: 1 History of Present Illness HPI Description: ADMISSION 02/24/2022 This is a 67 year old man with a past medical history significant for congestive heart failure, morbid obesity, dilated cardiomyopathy, atrial fibrillation, and lower extremity cellulitis. He is currently being treated with Keflex. He is not diabetic. He does not smoke. He says that he developed some blisters on his left anterior tibial surface which subsequently broke open causing the wounds for which she is here to see [place] today. He says that he occasionally wears compression stockings but does not do so on a regular basis. He reports that "they look stupid with shorts." ABI in clinic today was 0.94. On the left anterior tibial surface, there are several small wounds in a geographic pattern. They do appear consistent with blisters that have ruptured. The fat layer is  exposed and there is a thin layer of slough on the surfaces. He does have some surrounding erythema, but no purulent drainage. Edema control is very poor. 03/03/2022: The patient came in for a nurse visit last week because he thought the compression wraps were too tight. They were reapplied and apparently he did not understand the instruction to try to stay off his feet is much as possible and keep  his leg elevated; he instead walked excessively on both Friday and Saturday. As result his leg became more swollen, the wrap became uncomfortable and he cut it off yesterday. He replaced it with a wrap of his own fashion. Today he has an indentation in his leg where his homemade wrap ended. He has extensive 3+ nonpitting edema above this. The wounds themselves are in fairly decent condition with just a little bit of slough on the surface, but edema fluid is frankly pouring out of the largest wound. 03/14/2022: Significant improvement in his wound. He has come in a couple of times for nurse visits due to drainage. Today the wound is quite a bit smaller and just has a layer of eschar and slough on the surface. Edema control is better. 03/23/2022: Ongoing improvement in his wound. His edema control is suboptimal today, however, because his wrap slid down. There is also evidence of excoriation suggesting that perhaps he was scratching under the wrap and it slid. There is a little bit of eschar and slough on the wound surface. 03/28/2022: The patient has been fairly noncompliant with his compression wraps. As result, he has opened a new area of wounding on his anterior tibial surface just proximal to the original site. It is limited breakdown of skin. The original site is smaller and has a little slough buildup. He did see his cardiologist this week and is now taking torsemide twice a day. 04/04/2022: The patient continues to be fairly noncompliant with his care recommendations. He fails to show up for his venous  reflux studies on Monday. He continues to cut his wraps off. Although he is supposed to be taking torsemide twice a day, the degree of edema in his legs suggest that perhaps he is not being compliant with his medication, as well. His blood pressure was elevated today and he said that he did not take his antihypertensives. He claims that the methocarbamol and tizanidine he was prescribed for muscle spasms are responsible for his leg swelling. He has developed a new wound on his right anterior tibial surface. This is fairly small and limited to breakdown of skin. There is a small amount of slough present. The wounds on his left lower leg do not look at all improved. There is slough accumulation on a couple of the deeper spots. He also has an area on his dorsal foot and anterior ankle that look like they are threatening to breakdown; this is where he had cut his wrap back to so that they were rubbing right in this location. 04/14/2022: The right leg has healed and there has been substantial improvement to the wounds on the left. 04/21/2022: The left leg has deteriorated considerably. It is massively edematous and erythematous. The wound is much larger and is pouring fluid. He says that he got it wet in the shower when his cast protector apparently leaked. He says that he had to walk half a mile this morning to his car, as it had been parked some distance away. 05/01/2022: Since our last visit, the patient was admitted to the hospital due to shortness of breath and was found to have severe urinary retention. A Foley catheter was placed and he was diuresed. He continues to have the indwelling catheter and has an appointment with urology later today. His wound has not really improved at all. His leg is quite edematous with 3+ pitting edema to the mid thigh. The wound is erythematous and the skin is macerated. There is some slough and  nonviable subcutaneous tissue present on the wound  surface. READMISSION 07/17/2022 The patient returns after having not been seen since October. He was seen in the vein and vascular surgery clinic in November and found to have significant venous reflux. He is going to see Dr. Scot Dock sometime in the coming months, presumably to discuss saphenous vein ablation. They also recommended Unna boot compression and leg elevation, something the patient had been fairly noncompliant with during his first admission in our clinic. He returns today with residual ulcers on his left anterior tibial surface as well as a wound on his lateral foot, secondary to footwear injury. The wounds are dripping edema fluid and he is complaining of pain. There is slough accumulation on all of the open surfaces. Edema control is nonexistent. [name], [name] ([id number]) [id number] Page 3 of 12 07/24/2022: He is accompanied by his wife today. She says that they have had to change the dressings multiple times per day due to saturation from drainage. She asks if he can be seen on a daily basis in the wound care center. All of the wounds are smaller but do have some slough accumulation. They are still pouring edema fluid but the periwound skin is in better condition. Edema control remains poor. Electronic Signature(s) Signed: 07/24/2022 9:17:01 AM By: Fredirick Maudlin MD FACS Entered By: Fredirick Maudlin on 07/24/2022 09:17:01 -------------------------------------------------------------------------------- Physical Exam Details Patient Name: Date of Service: Clinton Liner RD O. 07/24/2022 8:00 A M Medical Record Number: [id number] Patient Account Number: [id number] Date of Birth/Sex: Treating RN: 1955-07-19 (68 y.o. M) Primary Care Provider: [name] Other Clinician: Referring Provider: Treating Provider/Extender: [name] in Treatment: 1 Constitutional . . . . no acute distress. Respiratory Normal work of  breathing on room air. Notes 07/24/2022: All of the wounds are smaller but do have some slough accumulation. They are still pouring edema fluid but the periwound skin is in better condition. Edema control remains poor. Electronic Signature(s) Signed: 07/24/2022 9:17:54 AM By: Fredirick Maudlin MD FACS Entered By: Fredirick Maudlin on 07/24/2022 09:17:54 -------------------------------------------------------------------------------- Physician Orders Details Patient Name: Date of Service: Clinton Liner RD O. 07/24/2022 8:00 A M Medical Record Number: [id number] Patient Account Number: [id number] Date of Birth/Sex: Treating RN: Jan 25, 1956 (67 y.o. [name] Primary Care Provider: [name] Other Clinician: Referring Provider: Treating Provider/Extender: [name] in Treatment: 1 Verbal / Phone Orders: No Diagnosis Coding ICD-10 Coding Code Description [phone] Non-pressure chronic ulcer of other part of left lower leg with fat layer exposed L97.522 Non-pressure chronic ulcer of other part of left foot with fat layer exposed I87.2 Venous insufficiency (chronic) (peripheral) E94.07 Chronic diastolic (congestive) heart failure E66.01 Morbid (severe) obesity due to excess calories I10 Essential (primary) hypertension Z79.01 Long term (current) use of anticoagulants Follow-up Appointments ppointment in 1 week. - Dr. Celine Ahr Room [address] [name], [name] ([id number]) [id number] Page 4 of 12 Bathing/ Shower/ Hygiene May shower with protection but do not get wound dressing(s) wet. Protect dressing(s) with water repellant cover (for example, large plastic bag) or a cast cover and may then take shower. - DO NOT GET LEFT LEG WET . Use a cast protector if showering. Edema Control - Lymphedema / SCD / Other void standing for long periods of time. - Elevate legs throughout the day. Keep taking the diuretics. A Exercise  regularly - May exercise but also remember to elevate legs Wound Treatment Wound #3 - Lower Leg Wound Laterality: Left,  Anterior Cleanser: Soap and Water 1 x Per Week/30 Days Discharge Instructions: May shower and wash wound with dial antibacterial soap and water prior to dressing change. Cleanser: Wound Cleanser 1 x Per Week/30 Days Discharge Instructions: Cleanse the wound with wound cleanser prior to applying a clean dressing using gauze sponges, not tissue or cotton balls. Peri-Wound Care: Triamcinolone 15 (g) 1 x Per Week/30 Days Discharge Instructions: Use triamcinolone 15 (g) as directed Prim Dressing: Iodosorb Gel 10 (gm) Tube 1 x Per Week/30 Days ary Discharge Instructions: Apply to wound bed as instructed Secondary Dressing: Woven Gauze Sponge, Non-Sterile 4x4 in (DME) (Generic) 1 x Per Week/30 Days Discharge Instructions: Apply over primary dressing as directed. Secondary Dressing: Zetuvit Plus 4x8 in (DME) (Generic) 1 x Per Week/30 Days Discharge Instructions: Apply over primary dressing as directed. Secured With: Transpore Surgical Tape, 2x10 (in/yd) (Generic) 1 x Per Week/30 Days Discharge Instructions: Secure dressing with tape as directed. Wound #4 - Foot Wound Laterality: Left, Lateral Cleanser: Soap and Water 1 x Per Week/30 Days Discharge Instructions: May shower and wash wound with dial antibacterial soap and water prior to dressing change. Cleanser: Wound Cleanser 1 x Per Week/30 Days Discharge Instructions: Cleanse the wound with wound cleanser prior to applying a clean dressing using gauze sponges, not tissue or cotton balls. Peri-Wound Care: Triamcinolone 15 (g) 1 x Per Week/30 Days Discharge Instructions: Use triamcinolone 15 (g) as directed Prim Dressing: Iodosorb Gel 10 (gm) Tube (DME) (Generic) 1 x Per Week/30 Days ary Discharge Instructions: Apply to wound bed as instructed Secondary Dressing: Woven Gauze Sponge, Non-Sterile 4x4 in (DME) (Generic) 1 x Per  Week/30 Days Discharge Instructions: Apply over primary dressing as directed. Secondary Dressing: Zetuvit Plus 4x8 in (DME) (Generic) 1 x Per Week/30 Days Discharge Instructions: Apply over primary dressing as directed. Compression Wrap: ThreePress (3 layer compression wrap) (DME) (Generic) 1 x Per Week/30 Days Discharge Instructions: Apply three layer compression as directed. Wound #5 - Ankle Wound Laterality: Left, Lateral Cleanser: Soap and Water 1 x Per Week/30 Days Discharge Instructions: May shower and wash wound with dial antibacterial soap and water prior to dressing change. Cleanser: Wound Cleanser (DME) (Generic) 1 x Per Week/30 Days Discharge Instructions: Cleanse the wound with wound cleanser prior to applying a clean dressing using gauze sponges, not tissue or cotton balls. Peri-Wound Care: Triamcinolone 15 (g) 1 x Per Week/30 Days Discharge Instructions: Use triamcinolone 15 (g) as directed Prim Dressing: Iodosorb Gel 10 (gm) Tube (DME) (Generic) 1 x Per Week/30 Days ary Discharge Instructions: Apply to wound bed as instructed Secondary Dressing: Woven Gauze Sponge, Non-Sterile 4x4 in (DME) (Generic) 1 x Per Week/30 Days Discharge Instructions: Apply over primary dressing as directed. Secondary Dressing: Zetuvit Plus 4x8 in (DME) (Generic) 1 x Per Week/30 Days Discharge Instructions: Apply over primary dressing as directed. Secured With: Transpore Surgical Tape, 2x10 (in/yd) (Generic) 1 x Per Week/30 Days Discharge Instructions: Secure dressing with tape as directed. Compression Wrap: ThreePress (3 layer compression wrap) (DME) (Generic) 1 x Per Week/30 Days [name], [name] ([id number]) [phone].pdf Page 5 of 12 Discharge Instructions: Apply three layer compression as directed. Wound #6 - Hand - 5th Digit Wound Laterality: Left Secondary Dressing: Bordered Gauze, 2x2 in (DME) (Generic) 1 x Per Day/30 Days Discharge Instructions: Apply over primary  dressing as directed. Electronic Signature(s) Signed: 07/24/2022 10:47:40 AM By: Fredirick Maudlin MD FACS Entered By: Fredirick Maudlin on 07/24/2022 09:18:11 -------------------------------------------------------------------------------- Problem List Details Patient Name: Date of Service: [name] RD  O. 07/24/2022 8:00 A M Medical Record Number: [id number] Patient Account Number: [id number] Date of Birth/Sex: Treating RN: 06/06/1956 (67 y.o. M) Primary Care Provider: [name] Other Clinician: Referring Provider: Treating Provider/Extender: [name] in Treatment: 1 Active Problems ICD-10 Encounter Code Description Active Date MDM Diagnosis [phone] Non-pressure chronic ulcer of other part of left lower leg with fat layer exposed1/05/2023 No Yes L97.522 Non-pressure chronic ulcer of other part of left foot with fat layer exposed 07/17/2022 No Yes I87.2 Venous insufficiency (chronic) (peripheral) 07/17/2022 No Yes S06.30 Chronic diastolic (congestive) heart failure 07/17/2022 No Yes E66.01 Morbid (severe) obesity due to excess calories 07/17/2022 No Yes I10 Essential (primary) hypertension 07/17/2022 No Yes Z79.01 Long term (current) use of anticoagulants 07/17/2022 No Yes Inactive Problems Resolved Problems Electronic Signature(s) Signed: 07/24/2022 8:51:52 AM By: Fredirick Maudlin MD FACS Entered By: Fredirick Maudlin on 07/24/2022 08:51:52 [name] ([id number]) [id number] Page 6 of 12 -------------------------------------------------------------------------------- Progress Note Details Patient Name: Date of Service: Clinton Liner RD O. 07/24/2022 8:00 A M Medical Record Number: [id number] Patient Account Number: [id number] Date of Birth/Sex: Treating RN: Feb 11, 1956 (67 y.o. M) Primary Care Provider: [name] Other Clinician: Referring Provider: Treating Provider/Extender: [name] in Treatment: 1 Subjective Chief Complaint Information obtained from Patient Patient presents for treatment of an open ulcer due to venous insufficiency History of Present Illness (HPI) ADMISSION 02/24/2022 This is a 67 year old man with a past medical history significant for congestive heart failure, morbid obesity, dilated cardiomyopathy, atrial fibrillation, and lower extremity cellulitis. He is currently being treated with Keflex. He is not diabetic. He does not smoke. He says that he developed some blisters on his left anterior tibial surface which subsequently broke open causing the wounds for which she is here to see [place] today. He says that he occasionally wears compression stockings but does not do so on a regular basis. He reports that "they look stupid with shorts." ABI in clinic today was 0.94. On the left anterior tibial surface, there are several small wounds in a geographic pattern. They do appear consistent with blisters that have ruptured. The fat layer is exposed and there is a thin layer of slough on the surfaces. He does have some surrounding erythema, but no purulent drainage. Edema control is very poor. 03/03/2022: The patient came in for a nurse visit last week because he thought the compression wraps were too tight. They were reapplied and apparently he did not understand the instruction to try to stay off his feet is much as possible and keep his leg elevated; he instead walked excessively on both Friday and Saturday. As result his leg became more swollen, the wrap became uncomfortable and he cut it off yesterday. He replaced it with a wrap of his own fashion. Today he has an indentation in his leg where his homemade wrap ended. He has extensive 3+ nonpitting edema above this. The wounds themselves are in fairly decent condition with just a little bit of slough on the surface, but edema fluid is frankly pouring out of the largest wound. 03/14/2022: Significant  improvement in his wound. He has come in a couple of times for nurse visits due to drainage. Today the wound is quite a bit smaller and just has a layer of eschar and slough on the surface. Edema control is better. 03/23/2022: Ongoing improvement in his wound. His edema control is suboptimal today, however, because his wrap slid down. There is also evidence of excoriation  suggesting that perhaps he was scratching under the wrap and it slid. There is a little bit of eschar and slough on the wound surface. 03/28/2022: The patient has been fairly noncompliant with his compression wraps. As result, he has opened a new area of wounding on his anterior tibial surface just proximal to the original site. It is limited breakdown of skin. The original site is smaller and has a little slough buildup. He did see his cardiologist this week and is now taking torsemide twice a day. 04/04/2022: The patient continues to be fairly noncompliant with his care recommendations. He fails to show up for his venous reflux studies on Monday. He continues to cut his wraps off. Although he is supposed to be taking torsemide twice a day, the degree of edema in his legs suggest that perhaps he is not being compliant with his medication, as well. His blood pressure was elevated today and he said that he did not take his antihypertensives. He claims that the methocarbamol and tizanidine he was prescribed for muscle spasms are responsible for his leg swelling. He has developed a new wound on his right anterior tibial surface. This is fairly small and limited to breakdown of skin. There is a small amount of slough present. The wounds on his left lower leg do not look at all improved. There is slough accumulation on a couple of the deeper spots. He also has an area on his dorsal foot and anterior ankle that look like they are threatening to breakdown; this is where he had cut his wrap back to so that they were rubbing right in this  location. 04/14/2022: The right leg has healed and there has been substantial improvement to the wounds on the left. 04/21/2022: The left leg has deteriorated considerably. It is massively edematous and erythematous. The wound is much larger and is pouring fluid. He says that he got it wet in the shower when his cast protector apparently leaked. He says that he had to walk half a mile this morning to his car, as it had been parked some distance away. 05/01/2022: Since our last visit, the patient was admitted to the hospital due to shortness of breath and was found to have severe urinary retention. A Foley catheter was placed and he was diuresed. He continues to have the indwelling catheter and has an appointment with urology later today. His wound has not really improved at all. His leg is quite edematous with 3+ pitting edema to the mid thigh. The wound is erythematous and the skin is macerated. There is some slough and nonviable subcutaneous tissue present on the wound surface. READMISSION 07/17/2022 The patient returns after having not been seen since October. He was seen in the vein and vascular surgery clinic in November and found to have significant venous reflux. He is going to see Dr. Scot Dock sometime in the coming months, presumably to discuss saphenous vein ablation. They also recommended Unna boot compression and leg elevation, something the patient had been fairly noncompliant with during his first admission in our clinic. He returns today with residual ulcers on his left anterior tibial surface as well as a wound on his lateral foot, secondary to footwear injury. The wounds are dripping edema fluid and he is complaining of pain. There is slough accumulation on all of the open surfaces. Edema control is nonexistent. 07/24/2022: He is accompanied by his wife today. She says that they have had to change the dressings multiple times per day due to saturation  from drainage. She asks if he can  be seen on a daily basis in the wound care center. All of the wounds are smaller but do have some slough accumulation. They are still pouring edema fluid but the periwound skin is in better condition. Edema control remains poor. Patient History Information obtained from Patient. Family History Cancer - Father,Mother, Hypertension - Mother,Father, No family history of Diabetes, Heart Disease, Hereditary Spherocytosis, Kidney Disease, Lung Disease, Seizures, Thyroid Problems, Tuberculosis. [name], [name] ([id number]) [id number] Page 7 of 12 Social History Never smoker, Marital Status - Married, Alcohol Use - Never, Drug Use - No History, Caffeine Use - Daily - [employer] dew. Medical History Eyes Patient has history of Cataracts - 2021 Ear/Nose/Mouth/Throat Denies history of Chronic sinus problems/congestion, Middle ear problems Hematologic/Lymphatic Patient has history of Lymphedema Respiratory Patient has history of Sleep Apnea Cardiovascular Patient has history of Congestive Heart Failure, Coronary Artery Disease, Hypertension Denies history of Deep Vein Thrombosis Gastrointestinal Denies history of Cirrhosis , Colitis, Crohnoos Endocrine Denies history of Type I Diabetes, Type II Diabetes Genitourinary Denies history of End Stage Renal Disease Immunological Denies history of Raynaudoos, Scleroderma Integumentary (Skin) Denies history of History of Burn Musculoskeletal Denies history of Gout, Rheumatoid Arthritis, Osteoarthritis, Osteomyelitis Neurologic Denies history of Dementia, Neuropathy, Quadriplegia, Paraplegia, Seizure Disorder Oncologic Denies history of Received Chemotherapy, Received Radiation Psychiatric Patient has history of Confinement Anxiety Denies history of Anorexia/bulimia Hospitalization/Surgery History - Inguinal Hernia repair- 2014. - insertion of mesh-2014. - Atrial ablation surgery- 2007. - BIla eyes lasik- 2008. - hernia  repair- 1977. Medical A Surgical History Notes nd Respiratory Allergic Rhinitis Cardiovascular AFIB Objective Constitutional no acute distress. Vitals Time Taken: 8:06 AM, Height: 76 in, Weight: 326 lbs, BMI: 39.7, Temperature: 97.9 F, Pulse: 82 bpm, Respiratory Rate: 16 breaths/min, Blood Pressure: 124/75 mmHg. Respiratory Normal work of breathing on room air. General Notes: 07/24/2022: All of the wounds are smaller but do have some slough accumulation. They are still pouring edema fluid but the periwound skin is in better condition. Edema control remains poor. Integumentary (Hair, Skin) Wound #3 status is Open. Original cause of wound was Gradually Appeared. The date acquired was: 05/12/2022. The wound has been in treatment 1 weeks. The wound is located on the Left,Anterior Lower Leg. The wound measures 10cm length x 12cm width x 0.1cm depth; 94.248cm^2 area and 9.425cm^3 volume. There is Fat Layer (Subcutaneous Tissue) exposed. There is no tunneling or undermining noted. There is a medium amount of serous drainage noted. There is medium (34-66%) pink granulation within the wound bed. There is a medium (34-66%) amount of necrotic tissue within the wound bed including Adherent Slough. The periwound skin appearance exhibited: Scarring, Maceration, Hemosiderin Staining. Periwound temperature was noted as No Abnormality. General Notes: Clustered wound Wound #4 status is Open. Original cause of wound was Footwear Injury. The date acquired was: 07/02/2022. The wound has been in treatment 1 weeks. The wound is located on the Left,Lateral Foot. The wound measures 1cm length x 1cm width x 0.1cm depth; 0.785cm^2 area and 0.079cm^3 volume. There is no tunneling or undermining noted. There is a small amount of serous drainage noted. There is small (1-33%) pink granulation within the wound bed. There is a large (67-100%) amount of necrotic tissue within the wound bed including Adherent Slough. The  periwound skin appearance had no abnormalities noted for texture. The periwound skin appearance had no abnormalities noted for moisture. The periwound skin appearance had no abnormalities noted for color. Periwound temperature was noted  as No Abnormality. Wound #5 status is Open. Original cause of wound was Footwear Injury. The date acquired was: 07/02/2022. The wound has been in treatment 1 weeks. The wound is located on the Left,Lateral Ankle. The wound measures 0.5cm length x 0.2cm width x 0.1cm depth; 0.079cm^2 area and 0.008cm^3 volume. There is Fat Layer (Subcutaneous Tissue) exposed. There is no tunneling or undermining noted. There is a small amount of serous drainage noted. There is medium (34- 66%) red granulation within the wound bed. There is a medium (34-66%) amount of necrotic tissue within the wound bed including Adherent Slough. The periwound skin appearance had no abnormalities noted for texture. The periwound skin appearance had no abnormalities noted for moisture. The periwound skin appearance had no abnormalities noted for color. Periwound temperature was noted as No Abnormality. [name], [name] ([id number]) [id number] Page 8 of 12 Wound #6 status is Open. Original cause of wound was Laceration. The date acquired was: 07/17/2022. The wound has been in treatment 1 weeks. The wound is located on the Left Hand - 5th Digit. The wound measures 0.1cm length x 0.1cm width x 0.1cm depth; 0.008cm^2 area and 0.001cm^3 volume. There is no tunneling or undermining noted. There is a small amount of sanguinous drainage noted. There is large (67-100%) red granulation within the wound bed. There is no necrotic tissue within the wound bed. The periwound skin appearance had no abnormalities noted for texture. The periwound skin appearance had no abnormalities noted for moisture. The periwound skin appearance had no abnormalities noted for color. Periwound temperature was  noted as No Abnormality. 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 There was a Selective/Open Wound Non-Viable Tissue Debridement with a total area of 120 sq cm performed by Fredirick Maudlin, MD. With the following instrument(s): Curette to remove Non-Viable tissue/material. Material removed includes [facility] after achieving pain control using Lidocaine 5% topical ointment. No specimens were taken. A time out was conducted at 08:35, prior to the start of the procedure. A Minimum amount of bleeding was controlled with Pressure. The procedure was tolerated well with a pain level of 0 throughout. Post Debridement Measurements: 10cm length x 12cm width x 0.1cm depth; 9.425cm^3 volume. Character of Wound/Ulcer Post Debridement is improved. Severity of Tissue Post Debridement is: Fat layer exposed. Post procedure Diagnosis Wound #3: Same as Pre-Procedure General Notes: Scribed for Dr. Celine Ahr by J.Scotton. Pre-procedure diagnosis of Wound #3 is a Venous Leg Ulcer located on the Left,Anterior Lower Leg . There was a Three Layer Compression Therapy Procedure by Dellie Catholic, RN. Post procedure Diagnosis Wound #3: Same as Pre-Procedure Plan Follow-up Appointments: Return Appointment in 1 week. - Dr. Celine Ahr Room 3 Bathing/ Shower/ Hygiene: May shower with protection but do not get wound dressing(s) wet. Protect dressing(s) with water repellant cover (for example, large  plastic bag) or a cast cover and may then take shower. - DO NOT GET LEFT LEG WET Use a cast protector if showering. . Edema Control - Lymphedema / SCD / Other: Avoid standing for long periods of time. - Elevate legs  throughout the day. Keep taking the diuretics. Exercise regularly - May exercise but also remember to elevate legs WOUND #3: - Lower Leg Wound Laterality: Left, Anterior Cleanser: Soap and Water 1 x Per Week/30 Days Discharge Instructions: May shower and wash wound with dial antibacterial soap and water prior to dressing change. Cleanser: Wound Cleanser 1 x Per Week/30 Days Discharge Instructions: Cleanse the wound with wound cleanser prior to applying a clean dressing using gauze sponges, not tissue or cotton balls. Peri-Wound Care: Triamcinolone 15 (g) 1 x Per Week/30 Days Discharge Instructions: Use triamcinolone 15 (g) as directed Prim Dressing: Iodosorb Gel 10 (gm) Tube 1 x Per Week/30 Days ary Discharge Instructions: Apply to wound bed as instructed Secondary Dressing: Woven Gauze Sponge, Non-Sterile 4x4 in (DME) (Generic) 1 x Per Week/30 Days Discharge Instructions: Apply over primary dressing as directed. Secondary Dressing: Zetuvit Plus 4x8 in (DME) (Generic) 1 x Per Week/30 Days Discharge Instructions: Apply over primary dressing as directed. Secured With: Transpore Surgical T ape, 2x10 (in/yd) (Generic) 1 x Per Week/30 Days Discharge Instructions: Secure dressing with tape as directed. WOUND #4: - Foot Wound Laterality: Left, Lateral Cleanser: Soap and Water 1 x Per Week/30 Days Discharge Instructions: May shower and wash wound with dial antibacterial soap and water prior to dressing change. Cleanser: Wound Cleanser 1 x Per Week/30 Days Discharge Instructions: Cleanse the wound with wound cleanser prior to applying a clean dressing using gauze sponges, not tissue or cotton balls. Peri-Wound Care: Triamcinolone 15 (g) 1 x Per Week/30 Days Discharge Instructions: Use  triamcinolone 15 (g) as directed Prim Dressing: Iodosorb Gel 10 (gm) Tube (DME) (Generic) 1 x Per Week/30 Days ary Discharge Instructions: Apply to wound bed as instructed Secondary Dressing: Woven Gauze Sponge, Non-Sterile 4x4 in (DME) (Generic) 1 x Per Week/30 Days Discharge Instructions: Apply over primary dressing as directed. Secondary Dressing: Zetuvit Plus 4x8 in (DME) (Generic) 1 x Per Week/30 Days Discharge Instructions: Apply over primary dressing as directed. [name], [name] ([id number]) [id number] Page 9 of 12 Com pression Wrap: ThreePress (3 layer compression wrap) (DME) (Generic) 1 x Per Week/30 Days Discharge Instructions: Apply three layer compression as directed. WOUND #5: - Ankle Wound Laterality: Left, Lateral Cleanser: Soap and Water 1 x Per Week/30 Days Discharge Instructions: May shower and wash wound with dial antibacterial soap and water prior to dressing change. Cleanser: Wound Cleanser (DME) (Generic) 1 x Per Week/30 Days Discharge Instructions: Cleanse the wound with wound cleanser prior to applying a clean dressing using gauze sponges, not tissue or cotton balls. Peri-Wound Care: Triamcinolone 15 (g) 1 x Per Week/30 Days Discharge Instructions: Use triamcinolone 15 (g) as directed Prim Dressing: Iodosorb Gel 10 (gm) Tube (DME) (Generic) 1 x Per Week/30 Days ary Discharge Instructions: Apply to wound bed as instructed Secondary Dressing: Woven Gauze Sponge, Non-Sterile 4x4 in (DME) (Generic) 1 x Per Week/30 Days Discharge Instructions: Apply over primary dressing as directed. Secondary Dressing: Zetuvit Plus 4x8 in (DME) (Generic) 1 x Per Week/30 Days Discharge Instructions: Apply over primary dressing as directed. Secured With: Transpore Surgical T ape, 2x10 (in/yd) (Generic) 1 x Per Week/30 Days Discharge Instructions: Secure dressing with tape as directed. Com pression Wrap: ThreePress (3 layer compression wrap) (DME) (Generic) 1  x Per Week/30 Days Discharge Instructions: Apply three layer compression as directed. WOUND #6: - Hand - 5th Digit Wound Laterality: Left Secondary Dressing: Bordered Gauze, 2x2 in (DME) (Generic) 1 x Per Day/30 Days Discharge Instructions: Apply over primary dressing as directed. 07/24/2022: All  of the wounds are smaller but do have some slough accumulation. They are still pouring edema fluid but the periwound skin is in better condition. Edema control remains poor. I used a curette to debride slough from the wound. This was poorly tolerated by the patient. He will benefit from ongoing chemical debridement so we will use Iodoflex/Iodosorb. I explained to the patient and his wife that daily dressing changes in clinic was not a feasible option. The patient's wife volunteered that she could learn to do the dressing changes and perform these at home. We will order dressing supplies and she was instructed in application of his wraps. Hopefully this will help reduce the edema and improve compliance. Follow-up in 1 week. Electronic Signature(s) Signed: 07/24/2022 9:20:04 AM By: Fredirick Maudlin MD FACS Previous Signature: 07/24/2022 9:19:26 AM Version By: Fredirick Maudlin MD FACS Entered By: Fredirick Maudlin on 07/24/2022 09:20:04 -------------------------------------------------------------------------------- HxROS Details Patient Name: Date of Service: Clinton Liner RD O. 07/24/2022 8:00 A M Medical Record Number: [id number] Patient Account Number: [id number] Date of Birth/Sex: Treating RN: 1956-01-11 (67 y.o. M) Primary Care Provider: [name] Other Clinician: Referring Provider: Treating Provider/Extender: [name] in Treatment: 1 Information Obtained From Patient Eyes Medical History: Positive for: Cataracts - 2021 Ear/Nose/Mouth/Throat Medical History: Negative for: Chronic sinus problems/congestion; Middle ear problems Hematologic/Lymphatic Medical  History: Positive for: Lymphedema Respiratory Medical History: Positive for: Sleep Apnea Past Medical History Notes: Allergic Rhinitis [name], [name] ([id number]) [phone].pdf Page 10 of 12 Cardiovascular Medical History: Positive for: Congestive Heart Failure; Coronary Artery Disease; Hypertension Negative for: Deep Vein Thrombosis Past Medical History Notes: AFIB Gastrointestinal Medical History: Negative for: Cirrhosis ; Colitis; Crohns Endocrine Medical History: Negative for: Type I Diabetes; Type II Diabetes Genitourinary Medical History: Negative for: End Stage Renal Disease Immunological Medical History: Negative for: Raynauds; Scleroderma Integumentary (Skin) Medical History: Negative for: History of Burn Musculoskeletal Medical History: Negative for: Gout; Rheumatoid Arthritis; Osteoarthritis; Osteomyelitis Neurologic Medical History: Negative for: Dementia; Neuropathy; Quadriplegia; Paraplegia; Seizure Disorder Oncologic Medical History: Negative for: Received Chemotherapy; Received Radiation Psychiatric Medical History: Positive for: Confinement Anxiety Negative for: Anorexia/bulimia HBO Extended History Items Eyes: Cataracts Immunizations Pneumococcal Vaccine: Received Pneumococcal Vaccination: Yes Received Pneumococcal Vaccination On or After 60th Birthday: Yes Implantable Devices None Hospitalization / Surgery History Type of Hospitalization/Surgery Inguinal Hernia repair- 2014 insertion of mesh-2014 Atrial ablation surgery- 2007 BIla eyes lasik- 2008 hernia repair- [address] Family and Social History [name], [name] ([id number]) [phone].pdf Page 11 of 12 Cancer: Yes - Father,Mother; Diabetes: No; Heart Disease: No; Hereditary Spherocytosis: No; Hypertension: Yes - Mother,Father; Kidney Disease: No; Lung Disease: No; Seizures: No; Thyroid Problems: No; Tuberculosis: No; Never smoker;  Marital Status - Married; Alcohol Use: Never; Drug Use: No History; Caffeine Use: Daily - [employer] dew; Financial Concerns: No; Food, Clothing or Shelter Needs: No; Support System Lacking: No; Transportation Concerns: No Electronic Signature(s) Signed: 07/24/2022 10:47:40 AM By: Fredirick Maudlin MD FACS Entered By: Fredirick Maudlin on 07/24/2022 09:17:06 -------------------------------------------------------------------------------- SuperBill Details Patient Name: Date of Service: Clinton Liner RD O. 07/24/2022 Medical Record Number: [id number] Patient Account Number: [id number] Date of Birth/Sex: Treating RN: Oct 01, 1955 (67 y.o. M) Primary Care Provider: [name] Other Clinician: Referring Provider: Treating Provider/Extender: [name] in Treatment: 1 Diagnosis Coding ICD-10 Codes Code Description [phone] Non-pressure chronic ulcer of other part of left lower leg with fat layer exposed L97.522 Non-pressure chronic ulcer of other part of left foot with fat layer exposed I87.2 Venous insufficiency (  chronic) (peripheral) H84.69 Chronic diastolic (congestive) heart failure E66.01 Morbid (severe) obesity due to excess calories I10 Essential (primary) hypertension Z79.01 Long term (current) use of anticoagulants Facility Procedures : CPT4 Code: [id number] Description: 32440 - DEBRIDE WOUND 1ST 20 SQ CM OR < ICD-10 Diagnosis Description 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d L97.522 Non-pressure chronic ulcer of other part of left foot with fat layer  exposed Modifier: Quantity: 1 : CPT4 Code: [id number] Description: 64403 - DEBRIDE WOUND EA ADDL 20 SQ CM ICD-10 Diagnosis Description 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d L97.522 Non-pressure chronic ulcer of other part of left foot with fat layer  exposed Modifier: Quantity: 5 Physician Procedures : CPT4  Code Description Modifier [id number] 63875 - WC PHYS LEVEL 3 - EST PT 25 ICD-10 Diagnosis Description 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d L97.522 Non-pressure chronic ulcer of other part of left  foot with fat layer exposed I87.2 Venous insufficiency (chronic) (peripheral) I43.32 Chronic diastolic (congestive) heart failure Quantity: 1 : [id number] 66063 - WC PHYS DEBR WO ANESTH 20 SQ CM ICD-10 Diagnosis Description 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d L97.522 Non-pressure chronic ulcer of other part of left foot with fat layer exposed Quantity: 1 : [id number] 32355 - WC PHYS DEBR WO ANESTH EA ADD 20 CM ICD-10 Diagnosis Description 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d L97.522 Non-pressure chronic ulcer of other part of left foot with fat layer  exposed COPELAN, MAULTSBY ([id number]) [phone].pdf Quantity: 5 Page 12 of 12 Electronic Signature(s) Signed: 07/24/2022 9:20:29 AM By: Fredirick Maudlin MD FACS Entered By: Fredirick Maudlin on 07/24/2022 09:20:29

## 2022-07-24 NOTE — Progress Notes (Signed)
DOUGLAS, SMOLINSKY ([id number]) [id number] Page 1 of 12 Visit Report for 07/24/2022 Arrival Information Details Patient Name: Date of Service: Clinton Liner RD O. 07/24/2022 8:00 A M Medical Record Number: [id number] Patient Account Number: [id number] Date of Birth/Sex: Treating [name]: May 14, 1956 (67 y.o. [name] Primary Care [name]: [name] Other Clinician: Referring [name]: Treating [name]/Extender: [name] in Treatment: 1 Visit Information History Since Last Visit Added or deleted any medications: No Patient Arrived: Ambulatory Any new allergies or adverse reactions: No Arrival Time: 08:06 Had a fall or experienced change in No Accompanied By: wife activities of daily living that may affect Transfer Assistance: Manual risk of falls: Patient Identification Verified: Yes Signs or symptoms of abuse/neglect since last visito No Patient Has Alerts: Yes Hospitalized since last visit: No Patient Alerts: Takes AC-Eliquis Implantable device outside of the clinic excluding No cellular tissue based products placed in the center since last visit: Pain Present Now: Yes Electronic Signature(s) Signed: 07/24/2022 5:03:19 PM By: [name] Entered By: Clinton Catholic on 07/24/2022 08:19:35 -------------------------------------------------------------------------------- Compression Therapy Details Patient Name: Date of Service: Clinton Liner RD O. 07/24/2022 8:00 A M Medical Record Number: [id number] Patient Account Number: [id number] Date of Birth/Sex: Treating [name]: 07-31-1955 (67 y.o. [name] Primary Care [name]: [name] Other Clinician: Referring Akira Adelsberger: Treating [name]/Extender: [name] in Treatment: 1 Compression Therapy Performed for Wound Assessment: Wound #3 Left,Anterior Lower Leg Performed By: Clinician Clinton Catholic, [name] Compression Type:  Three Layer Post Procedure Diagnosis Same as Pre-procedure Electronic Signature(s) Signed: 07/24/2022 5:03:19 PM By: [name] Entered By: Clinton Catholic on 07/24/2022 08:42:08 Encounter Discharge Information Details -------------------------------------------------------------------------------- [name] ([id number]) [id number] Page 2 of 12 Patient Name: Date of Service: Clinton Liner RD O. 07/24/2022 8:00 A M Medical Record Number: [id number] Patient Account Number: [id number] Date of Birth/Sex: Treating [name]: Nov 10, 1955 (67 y.o. [name] Primary Care [name]: [name] Other Clinician: Referring [name]: Treating [name]/Extender: [name] in Treatment: 1 Encounter Discharge Information Items Post Procedure Vitals Discharge Condition: Stable Temperature (F): 97.9 Ambulatory Status: Ambulatory Pulse (bpm): 82 Discharge Destination: Home Respiratory Rate (breaths/min): 16 Transportation: Private Auto Blood Pressure (mmHg): 124/75 Accompanied By: spouse Schedule Follow-up Appointment: Yes Clinical Summary of Care: Patient Declined Electronic Signature(s) Signed: 07/24/2022 5:03:19 PM By: [name] Entered By: Clinton Catholic on 07/24/2022 17:02:45 -------------------------------------------------------------------------------- Lower Extremity Assessment Details Patient Name: Date of Service: Clinton Liner RD O. 07/24/2022 8:00 A M Medical Record Number: [id number] Patient Account Number: [id number] Date of Birth/Sex: Treating [name]: [birth year] (67 y.o. [name] Primary Care Harlee Eckroth: [name] Other Clinician: Referring Kazandra Forstrom: Treating Dashiel Bergquist/Extender: [name] in Treatment: 1 Edema Assessment Assessed: [name]: No] [name]: No] [Left: Edema] [Right: :] Calf Left: Right: Point of Measurement: From Medial Instep 44  cm Ankle Left: Right: Point of Measurement: From Medial Instep 30 cm Vascular Assessment Pulses: Dorsalis Pedis Palpable: [Left:Yes] Electronic Signature(s) Signed: 07/24/2022 5:03:19 PM By: [name] Entered By: Clinton Catholic on 07/24/2022 08:39:06 -------------------------------------------------------------------------------- Multi Wound Chart Details Patient Name: Date of Service: Clinton Liner RD O. 07/24/2022 8:00 A M Medical Record Number: [id number] Patient Account Number: [id number] [name], [name] ([id number]) [id number].pdf Page 3 of 12 Date of Birth/Sex: Treating [name]: 1956/02/21 (67 y.o. M) Primary Care Melbourne Jakub[place]: [name] Other Clinician: Referring Harm Jou: Treating Aditya Nastasi/Extender: [name] in Treatment: 1 Vital Signs Height(in):  76 Pulse(bpm): 82 Weight(lbs): 326 Blood Pressure(mmHg): 124/75 Body Mass Index(BMI): 39.7 Temperature(F): 97.9 Respiratory Rate(breaths/min): 16 [3:Photos:] Left, Anterior Lower Leg Left, Lateral Foot Left, Lateral Ankle Wound Location: Gradually Appeared Footwear Injury Footwear Injury Wounding Event: Venous Leg Ulcer Abrasion Abrasion Primary Etiology: Cataracts, Lymphedema, Sleep Cataracts, Lymphedema, Sleep Cataracts, Lymphedema, Sleep Comorbid History: Apnea, Congestive Heart Failure, Apnea, Congestive Heart Failure, Apnea, Congestive Heart Failure, Coronary Artery Disease, Coronary Artery Disease, Coronary Artery Disease, Hypertension, Confinement Anxiety Hypertension, Confinement Anxiety Hypertension, Confinement Anxiety 05/12/2022 07/02/2022 07/02/2022 Date Acquired: '1 1 1 '$ Weeks of Treatment: Open Open Open Wound Status: No No No Wound Recurrence: 10x12x0.1 1x1x0.1 0.5x0.2x0.1 Measurements L x W x D (cm) 94.248 0.785 0.079 A (cm) : rea 9.425 0.079 0.008 Volume (cm) : 68.40% 73.30% 49.70% % Reduction in A rea: 68.40% 73.20% 50.00% %  Reduction in Volume: Full Thickness Without Exposed Full Thickness Without Exposed Full Thickness Without Exposed Classification: Support Structures Support Structures Support Structures Medium Small Small Exudate A mount: Serous Serous Serous Exudate Type: amber amber amber Exudate Color: Medium (34-66%) Small (1-33%) Medium (34-66%) Granulation A mount: Pink Pink Red Granulation Quality: Medium (34-66%) Large (67-100%) Medium (34-66%) Necrotic A mount: Fat Layer (Subcutaneous Tissue): Yes Fascia: No Fat Layer (Subcutaneous Tissue): Yes Exposed Structures: Fat Layer (Subcutaneous Tissue): No Fascia: No Tendon: No Tendon: No Muscle: No Muscle: No Joint: No Joint: No Bone: No Bone: No None None Small (1-33%) Epithelialization: Debridement - Excisional N/A N/A Debridement: Pre-procedure Verification/Time Out 08:35 N/A N/A Taken: Lidocaine 5% topical ointment N/A N/A Pain Control: Subcutaneous, Slough N/A N/A Tissue Debrided: Skin/Subcutaneous Tissue N/A N/A Level: 120 N/A N/A Debridement A (sq cm): rea Curette N/A N/A Instrument: Minimum N/A N/A Bleeding: Pressure N/A N/A Hemostasis A chieved: 0 N/A N/A Procedural Pain: Procedure was tolerated well N/A N/A Debridement Treatment Response: 10x12x0.1 N/A N/A Post Debridement Measurements L x W x D (cm) 9.425 N/A N/A Post Debridement Volume: (cm) Scarring: Yes No Abnormalities Noted No Abnormalities Noted Periwound Skin Texture: Maceration: Yes No Abnormalities Noted No Abnormalities Noted Periwound Skin Moisture: Hemosiderin Staining: Yes No Abnormalities Noted No Abnormalities Noted Periwound Skin Color: No Abnormality No Abnormality No Abnormality Temperature: Clustered wound N/A N/A Assessment Notes: Compression Therapy N/A N/A Procedures Performed: Debridement Wound Number: 6 N/A N/A Photos: N/A N/A [name], [name] ([id number]) [id number] Page 4 of 12 Left Hand -  5th Digit N/A N/A Wound Location: Laceration N/A N/A Wounding Event: Trauma, Other N/A N/A Primary Etiology: Cataracts, Lymphedema, Sleep N/A N/A Comorbid History: Apnea, Congestive Heart Failure, Coronary Artery Disease, Hypertension, Confinement Anxiety 07/17/2022 N/A N/A Date A cquired: 1 N/A N/A Weeks of Treatment: Open N/A N/A Wound Status: No N/A N/A Wound Recurrence: 0.1x0.1x0.1 N/A N/A Measurements L x W x D (cm) 0.008 N/A N/A A (cm) : rea 0.001 N/A N/A Volume (cm) : 79.50% N/A N/A % Reduction in A rea: 75.00% N/A N/A % Reduction in Volume: Partial Thickness N/A N/A Classification: Small N/A N/A Exudate A mount: Sanguinous N/A N/A Exudate Type: red N/A N/A Exudate Color: Large (67-100%) N/A N/A Granulation A mount: Red N/A N/A Granulation Quality: None Present (0%) N/A N/A Necrotic A mount: Fascia: No N/A N/A Exposed Structures: Fat Layer (Subcutaneous Tissue): No Tendon: No Muscle: No Joint: No Bone: No None N/A N/A Epithelialization: N/A N/A N/A Debridement: N/A N/A N/A Pain Control: N/A N/A N/A Tissue Debrided: N/A N/A N/A Level: N/A N/A N/A Debridement A (sq cm): rea N/A N/A N/A Instrument: N/A N/A N/A Bleeding:  N/A N/A N/A Hemostasis A chieved: N/A N/A N/A Procedural Pain: Debridement Treatment Response: N/A N/A N/A Post Debridement Measurements L x N/A N/A N/A W x D (cm) N/A N/A N/A Post Debridement Volume: (cm) No Abnormalities Noted N/A N/A Periwound Skin Texture: No Abnormalities Noted N/A N/A Periwound Skin Moisture: No Abnormalities Noted N/A N/A Periwound Skin Color: No Abnormality N/A N/A Temperature: N/A N/A N/A Assessment Notes: N/A N/A N/A Procedures Performed: Treatment Notes Electronic Signature(s) Signed: 07/24/2022 9:15:39 AM By: Fredirick Maudlin MD FACS Entered By: Fredirick Maudlin on 07/24/2022  09:15:39 -------------------------------------------------------------------------------- Multi-Disciplinary Care Plan Details Patient Name: Date of Service: Clinton Liner RD O. 07/24/2022 8:00 A M Medical Record Number: [id number] Patient Account Number: [id number] Date of Birth/Sex: Treating [name]: Mar 31, 1956 (67 y.o. [name] Primary Care Kamuela Magos: [name] Other Clinician: Referring [name]: Treating Taevon Aschoff/Extender: [name] in Treatment: 1 MUZAMIL, HARKER ([id number]) [id number] Page 5 of 12 Active Inactive Wound/Skin Impairment Nursing Diagnoses: Knowledge deficit related to ulceration/compromised skin integrity Goals: Patient/caregiver will verbalize understanding of skin care regimen Date Initiated: 07/17/2022 Target Resolution Date: 11/04/2022 Goal Status: Active Interventions: Assess ulceration(s) every visit Treatment Activities: Skin care regimen initiated : 07/17/2022 Notes: Electronic Signature(s) Signed: 07/24/2022 5:03:19 PM By: [name] Entered By: Clinton Catholic on 07/24/2022 16:57:07 -------------------------------------------------------------------------------- Pain Assessment Details Patient Name: Date of Service: Clinton Liner RD O. 07/24/2022 8:00 A M Medical Record Number: [id number] Patient Account Number: [id number] Date of Birth/Sex: Treating [name]: 06/09/1956 (67 y.o. [name] Primary Care Olar Santini: [name] Other Clinician: Referring Lotus Santillo: Treating Edelmiro Innocent/Extender: [name] in Treatment: 1 Active Problems Location of Pain Severity and Description of Pain Patient Has Paino Yes Site Locations Pain Location: Pain in Ulcers With Dressing Change: Yes Duration of the Pain. Constant / Intermittento Constant Rate the pain. Current Pain Level: 10 Worst Pain Level: 10 Least Pain Level: 6 Tolerable Pain Level:  6 Character of Pain Describe the Pain: Difficult to Pinpoint Pain Management and Medication Current Pain Management: Medication: Yes Cold Application: No Rest: Yes Massage: No Activity: No T.E.N.S.: No Heat Application: No Leg drop or elevation: No Is the Current Pain Management Adequate: Adequate [name], [name] ([id number]) [phone].pdf Page 6 of 12 How does your wound impact your activities of daily livingo Appetite: No Bathing: No Bladder Continence: No Relationship With Others: No Bowel Continence: No Emotions: No Toileting: No Work: No Dressing: No Drive: No Hobbies: No Electronic Signature(s) Signed: 07/24/2022 5:03:19 PM By: [name] Entered By: Clinton Catholic on 07/24/2022 08:38:38 -------------------------------------------------------------------------------- Patient/Caregiver Education Details Patient Name: Date of Service: Clinton Liner RD O. 1/18/2024andnbsp8:00 A M Medical Record Number: [id number] Patient Account Number: [id number] Date of Birth/Gender: Treating [name]: 04-Feb-1956 (67 y.o. [name] Primary Care Physician: [name] Other Clinician: Referring Physician: Treating Physician/Extender: [name] in Treatment: 1 Education Assessment Education Provided To: Patient Education Topics Provided Wound/Skin Impairment: Methods: Explain/Verbal Responses: Return demonstration correctly Electronic Signature(s) Signed: 07/24/2022 5:03:19 PM By: [name] Entered By: Clinton Catholic on 07/24/2022 16:57:20 -------------------------------------------------------------------------------- Wound Assessment Details Patient Name: Date of Service: Clinton Liner RD O. 07/24/2022 8:00 A M Medical Record Number: [id number] Patient Account Number: [id number] Date of Birth/Sex: Treating [name]: 25-May-1956 (67 y.o. [name] Primary Care Lowen Barringer: [name] Other  Clinician: Referring Mayerli Kirst: Treating Fallan Mccarey/Extender: [name] in Treatment: 1 Wound Status Wound Number: 3 Primary Venous Leg Ulcer Etiology:  Wound Location: Left, Anterior Lower Leg Wound Open Wounding Event: Gradually Appeared Status: Date Acquired: 05/12/2022 Comorbid Cataracts, Lymphedema, Sleep Apnea, Congestive Heart Failure, Weeks Of Treatment: 1 History: Coronary Artery Disease, Hypertension, Confinement Anxiety Clustered Wound: No Photos [name], [name] ([id number]) [id number] Page 7 of 12 Wound Measurements Length: (cm) 10 Width: (cm) 12 Depth: (cm) 0.1 Area: (cm) 94.248 Volume: (cm) 9.425 % Reduction in Area: 68.4% % Reduction in Volume: 68.4% Epithelialization: None Tunneling: No Undermining: No Wound Description Classification: Full Thickness Without Exposed Support Structures Exudate Amount: Medium Exudate Type: Serous Exudate Color: amber Foul Odor After Cleansing: No Slough/Fibrino Yes Wound Bed Granulation Amount: Medium (34-66%) Exposed Structure Granulation Quality: Pink Fat Layer (Subcutaneous Tissue) Exposed: Yes Necrotic Amount: Medium (34-66%) Necrotic Quality: Adherent Slough Periwound Skin Texture Texture Color No Abnormalities Noted: No No Abnormalities Noted: No Scarring: Yes Hemosiderin Staining: Yes Moisture Temperature / Pain No Abnormalities Noted: No Temperature: No Abnormality Maceration: Yes Assessment Notes Clustered wound Treatment Notes Wound #3 (Lower Leg) Wound Laterality: Left, Anterior Cleanser Soap and Water Discharge Instruction: May shower and wash wound with dial antibacterial soap and water prior to dressing change. Wound Cleanser Discharge Instruction: Cleanse the wound with wound cleanser prior to applying a clean dressing using gauze sponges, not tissue or cotton balls. Peri-Wound Care Triamcinolone 15 (g) Discharge Instruction: Use  triamcinolone 15 (g) as directed Topical Primary Dressing Iodosorb Gel 10 (gm) Tube Discharge Instruction: Apply to wound bed as instructed Secondary Dressing Woven Gauze Sponge, Non-Sterile 4x4 in Discharge Instruction: Apply over primary dressing as directed. Zetuvit Plus 4x8 in Discharge Instruction: Apply over primary dressing as directed. Secured With Transpore Surgical Tape, 2x10 (in/yd) Discharge Instruction: Secure dressing with tape as directed. [name], [name] ([id number]) [id number] Page 8 of 12 Compression Wrap Compression Stockings Add-Ons Electronic Signature(s) Signed: 07/24/2022 5:03:19 PM By: [name] Entered By: Clinton Catholic on 07/24/2022 08:26:17 -------------------------------------------------------------------------------- Wound Assessment Details Patient Name: Date of Service: Clinton Liner RD O. 07/24/2022 8:00 A M Medical Record Number: [id number] Patient Account Number: [id number] Date of Birth/Sex: Treating [name]: 1955-12-23 (67 y.o. [name] Primary Care Camya Haydon: [name] Other Clinician: Referring Kace Hartje: Treating Marieanne Marxen/Extender: [name] in Treatment: 1 Wound Status Wound Number: 4 Primary Abrasion Etiology: Wound Location: Left, Lateral Foot Wound Open Wounding Event: Footwear Injury Status: Date Acquired: 07/02/2022 Comorbid Cataracts, Lymphedema, Sleep Apnea, Congestive Heart Failure, Weeks Of Treatment: 1 History: Coronary Artery Disease, Hypertension, Confinement Anxiety Clustered Wound: No Photos Wound Measurements Length: (cm) 1 Width: (cm) 1 Depth: (cm) 0.1 Area: (cm) 0.785 Volume: (cm) 0.079 % Reduction in Area: 73.3% % Reduction in Volume: 73.2% Epithelialization: None Tunneling: No Undermining: No Wound Description Classification: Full Thickness Without Exposed Support Exudate Amount: Small Exudate Type: Serous Exudate Color:  amber Structures Foul Odor After Cleansing: No Slough/Fibrino Yes Wound Bed Granulation Amount: Small (1-33%) Exposed Structure Granulation Quality: Pink Fascia Exposed: No Necrotic Amount: Large (67-100%) Fat Layer (Subcutaneous Tissue) Exposed: No Necrotic Quality: Adherent Slough Tendon Exposed: No Muscle Exposed: No Joint Exposed: No Bone Exposed: No Periwound Skin Texture Texture Color No Abnormalities Noted: Yes No Abnormalities NotedJOHNCHARLES, [name] ([id number]) [phone].pdf Page 9 of 12 Moisture Temperature / Pain No Abnormalities Noted: Yes Temperature: No Abnormality Treatment Notes Wound #4 (Foot) Wound Laterality: Left, Lateral Cleanser Soap and Water Discharge Instruction: May shower and wash wound with dial antibacterial soap and water prior to dressing change. Wound Cleanser Discharge Instruction: Cleanse the wound with wound cleanser  prior to applying a clean dressing using gauze sponges, not tissue or cotton balls. Peri-Wound Care Triamcinolone 15 (g) Discharge Instruction: Use triamcinolone 15 (g) as directed Topical Primary Dressing Iodosorb Gel 10 (gm) Tube Discharge Instruction: Apply to wound bed as instructed Secondary Dressing Woven Gauze Sponge, Non-Sterile 4x4 in Discharge Instruction: Apply over primary dressing as directed. Zetuvit Plus 4x8 in Discharge Instruction: Apply over primary dressing as directed. Secured With Compression Wrap ThreePress (3 layer compression wrap) Discharge Instruction: Apply three layer compression as directed. Compression Stockings Add-Ons Electronic Signature(s) Signed: 07/24/2022 5:03:19 PM By: [name] Entered By: Clinton Catholic on 07/24/2022 08:27:26 -------------------------------------------------------------------------------- Wound Assessment Details Patient Name: Date of Service: Clinton Liner RD O. 07/24/2022 8:00 A M Medical Record Number:  [id number] Patient Account Number: [id number] Date of Birth/Sex: Treating [name]: [birth year] (67 y.o. [name] Primary Care Alise Calais: [name] Other Clinician: Referring Yazmeen Woolf: Treating Meleah Demeyer/Extender: [name] in Treatment: 1 Wound Status Wound Number: 5 Primary Abrasion Etiology: Wound Location: Left, Lateral Ankle Wound Open Wounding Event: Footwear Injury Status: Date Acquired: 07/02/2022 Comorbid Cataracts, Lymphedema, Sleep Apnea, Congestive Heart Failure, Weeks Of Treatment: 1 History: Coronary Artery Disease, Hypertension, Confinement Anxiety Clustered Wound: No Photos [name], [name] ([id number]) [id number] Page 10 of 12 Wound Measurements Length: (cm) 0.5 Width: (cm) 0.2 Depth: (cm) 0.1 Area: (cm) 0.079 Volume: (cm) 0.008 % Reduction in Area: 49.7% % Reduction in Volume: 50% Epithelialization: Small (1-33%) Tunneling: No Undermining: No Wound Description Classification: Full Thickness Without Exposed Support Structures Exudate Amount: Small Exudate Type: Serous Exudate Color: amber Foul Odor After Cleansing: No Slough/Fibrino Yes Wound Bed Granulation Amount: Medium (34-66%) Exposed Structure Granulation Quality: Red Fascia Exposed: No Necrotic Amount: Medium (34-66%) Fat Layer (Subcutaneous Tissue) Exposed: Yes Necrotic Quality: Adherent Slough Tendon Exposed: No Muscle Exposed: No Joint Exposed: No Bone Exposed: No Periwound Skin Texture Texture Color No Abnormalities Noted: Yes No Abnormalities Noted: Yes Moisture Temperature / Pain No Abnormalities Noted: Yes Temperature: No Abnormality Treatment Notes Wound #5 (Ankle) Wound Laterality: Left, Lateral Cleanser Soap and Water Discharge Instruction: May shower and wash wound with dial antibacterial soap and water prior to dressing change. Wound Cleanser Discharge Instruction: Cleanse the wound with wound cleanser prior  to applying a clean dressing using gauze sponges, not tissue or cotton balls. Peri-Wound Care Triamcinolone 15 (g) Discharge Instruction: Use triamcinolone 15 (g) as directed Topical Primary Dressing Iodosorb Gel 10 (gm) Tube Discharge Instruction: Apply to wound bed as instructed Secondary Dressing Woven Gauze Sponge, Non-Sterile 4x4 in Discharge Instruction: Apply over primary dressing as directed. Zetuvit Plus 4x8 in Discharge Instruction: Apply over primary dressing as directed. Secured With Transpore Surgical Tape, 2x10 (in/yd) Discharge Instruction: Secure dressing with tape as directed. Compression Wrap ThreePress (3 layer compression wrap) [name], [name] ([id number]) [id number] Page 11 of 12 Discharge Instruction: Apply three layer compression as directed. Compression Stockings Add-Ons Electronic Signature(s) Signed: 07/24/2022 5:03:19 PM By: [name] Entered By: Clinton Catholic on 07/24/2022 08:28:00 -------------------------------------------------------------------------------- Wound Assessment Details Patient Name: Date of Service: Clinton Liner RD O. 07/24/2022 8:00 A M Medical Record Number: [id number] Patient Account Number: [id number] Date of Birth/Sex: Treating [name]: 08/30/1955 (67 y.o. [name] Primary Care Madalin Hughart: [name] Other Clinician: Referring Beanca Kiester: Treating Raeya Merritts/Extender: [name] in Treatment: 1 Wound Status Wound Number: 6 Primary Trauma, Other Etiology: Wound Location: Left Hand - 5th Digit Wound Open Wounding Event: Laceration Status: Date Acquired: 07/17/2022 Comorbid Cataracts, Lymphedema,  Sleep Apnea, Congestive Heart Failure, Weeks Of Treatment: 1 History: Coronary Artery Disease, Hypertension, Confinement Anxiety Clustered Wound: No Photos Wound Measurements Length: (cm) 0.1 Width: (cm) 0.1 Depth: (cm) 0.1 Area: (cm) 0.008 Volume: (cm)  0.001 % Reduction in Area: 79.5% % Reduction in Volume: 75% Epithelialization: None Tunneling: No Undermining: No Wound Description Classification: Partial Thickness Exudate Amount: Small Exudate Type: Sanguinous Exudate Color: red Foul Odor After Cleansing: No Wound Bed Granulation Amount: Large (67-100%) Exposed Structure Granulation Quality: Red Fascia Exposed: No Necrotic Amount: None Present (0%) Fat Layer (Subcutaneous Tissue) Exposed: No Tendon Exposed: No Muscle Exposed: No Joint Exposed: No Bone Exposed: No Periwound Skin Texture Texture Color No Abnormalities Noted: Yes No Abnormalities NotedTAKASHI, [name] ([id number]) [id number] Page 12 of 12 No Abnormalities Noted: Yes No Abnormalities Noted: Yes Moisture Temperature / Pain No Abnormalities Noted: Yes Temperature: No Abnormality Treatment Notes Wound #6 (Hand - 5th Digit) Wound Laterality: Left Cleanser Peri-Wound Care Topical Primary Dressing Secondary Dressing Bordered Gauze, 2x2 in Discharge Instruction: Apply over primary dressing as directed. Secured With Compression Wrap Compression Stockings Environmental education officer) Signed: 07/24/2022 5:03:19 PM By: [name] Entered By: Clinton Catholic on 07/24/2022 17:49:44 -------------------------------------------------------------------------------- Vitals Details Patient Name: Date of Service: Clinton Liner RD O. 07/24/2022 8:00 A M Medical Record Number: [id number] Patient Account Number: [id number] Date of Birth/Sex: Treating [name]: 1955-09-14 (67 y.o. [name] Primary Care Talishia Betzler: [name] Other Clinician: Referring Hilery Wintle: Treating Petro Talent/Extender: [name] in Treatment: 1 Vital Signs Time Taken: 08:06 Temperature (F): 97.9 Height (in): 76 Pulse (bpm): 82 Weight (lbs): 326 Respiratory Rate (breaths/min): 16 Body Mass Index (BMI):  39.7 Blood Pressure (mmHg): 124/75 Reference Range: 80 - 120 mg / dl Electronic Signature(s) Signed: 07/24/2022 5:03:19 PM By: [name] Entered By: Clinton Catholic on 07/24/2022 08:37:57

## 2022-07-25 ENCOUNTER — Other Ambulatory Visit: Payer: Self-pay | Admitting: Cardiology

## 2022-07-25 NOTE — Telephone Encounter (Signed)
Prescription refill request for Eliquis received. Indication: AFIB Last office visit: 03/26/2022, [place] Scr: 3.90, 04/26/2022 Age: 67 yo  Weight: 147.9 kg   Refill sent.

## 2022-07-31 ENCOUNTER — Encounter (HOSPITAL_BASED_OUTPATIENT_CLINIC_OR_DEPARTMENT_OTHER): Payer: Medicare Other | Admitting: General Surgery

## 2022-08-01 ENCOUNTER — Encounter (HOSPITAL_BASED_OUTPATIENT_CLINIC_OR_DEPARTMENT_OTHER): Payer: Medicare Other | Admitting: General Surgery

## 2022-08-01 ENCOUNTER — Encounter (HOSPITAL_BASED_OUTPATIENT_CLINIC_OR_DEPARTMENT_OTHER): Payer: Self-pay

## 2022-08-06 ENCOUNTER — Telehealth: Payer: Self-pay

## 2022-08-06 NOTE — Telephone Encounter (Signed)
I CALLED THE PATIENT TO SPEAKWITH HIM CONCERNING HIS ITAMAR DEVICE, UNABLE TO LVM DUE TO MAILBOX IS FULL. I CALLED HIS WIFE PHONE AND LVM FOR HIM TO RETURN MY CALL.

## 2022-08-13 ENCOUNTER — Emergency Department (HOSPITAL_BASED_OUTPATIENT_CLINIC_OR_DEPARTMENT_OTHER): Payer: Medicare Other | Admitting: Radiology

## 2022-08-13 ENCOUNTER — Telehealth: Payer: Self-pay

## 2022-08-13 ENCOUNTER — Emergency Department (HOSPITAL_BASED_OUTPATIENT_CLINIC_OR_DEPARTMENT_OTHER): Payer: Medicare Other

## 2022-08-13 ENCOUNTER — Other Ambulatory Visit: Payer: Self-pay

## 2022-08-13 ENCOUNTER — Inpatient Hospital Stay (HOSPITAL_BASED_OUTPATIENT_CLINIC_OR_DEPARTMENT_OTHER)
Admission: EM | Admit: 2022-08-13 | Discharge: 2022-08-18 | DRG: 871 | Disposition: A | Discharge status: Home / Self Care | Payer: Medicare Other | Attending: Internal Medicine | Admitting: Internal Medicine

## 2022-08-13 ENCOUNTER — Encounter (HOSPITAL_BASED_OUTPATIENT_CLINIC_OR_DEPARTMENT_OTHER): Payer: Self-pay | Admitting: Emergency Medicine

## 2022-08-13 DIAGNOSIS — N19 Unspecified kidney failure: Secondary | ICD-10-CM | POA: Diagnosis present

## 2022-08-13 DIAGNOSIS — N179 Acute kidney failure, unspecified: Secondary | ICD-10-CM | POA: Diagnosis present

## 2022-08-13 DIAGNOSIS — Z91199 Patient's noncompliance with other medical treatment and regimen due to unspecified reason: Secondary | ICD-10-CM

## 2022-08-13 DIAGNOSIS — E875 Hyperkalemia: Secondary | ICD-10-CM | POA: Diagnosis present

## 2022-08-13 DIAGNOSIS — I10 Essential (primary) hypertension: Secondary | ICD-10-CM | POA: Diagnosis present

## 2022-08-13 DIAGNOSIS — F05 Delirium due to known physiological condition: Secondary | ICD-10-CM | POA: Diagnosis present

## 2022-08-13 DIAGNOSIS — L97929 Non-pressure chronic ulcer of unspecified part of left lower leg with unspecified severity: Secondary | ICD-10-CM | POA: Diagnosis present

## 2022-08-13 DIAGNOSIS — Z8042 Family history of malignant neoplasm of prostate: Secondary | ICD-10-CM

## 2022-08-13 DIAGNOSIS — E876 Hypokalemia: Secondary | ICD-10-CM | POA: Diagnosis not present

## 2022-08-13 DIAGNOSIS — N184 Chronic kidney disease, stage 4 (severe): Secondary | ICD-10-CM | POA: Diagnosis present

## 2022-08-13 DIAGNOSIS — Z79899 Other long term (current) drug therapy: Secondary | ICD-10-CM

## 2022-08-13 DIAGNOSIS — N400 Enlarged prostate without lower urinary tract symptoms: Secondary | ICD-10-CM | POA: Diagnosis present

## 2022-08-13 DIAGNOSIS — Z803 Family history of malignant neoplasm of breast: Secondary | ICD-10-CM

## 2022-08-13 DIAGNOSIS — M545 Low back pain, unspecified: Secondary | ICD-10-CM | POA: Diagnosis present

## 2022-08-13 DIAGNOSIS — N189 Chronic kidney disease, unspecified: Secondary | ICD-10-CM | POA: Diagnosis present

## 2022-08-13 DIAGNOSIS — N136 Pyonephrosis: Secondary | ICD-10-CM | POA: Diagnosis present

## 2022-08-13 DIAGNOSIS — D631 Anemia in chronic kidney disease: Secondary | ICD-10-CM | POA: Diagnosis present

## 2022-08-13 DIAGNOSIS — Z807 Family history of other malignant neoplasms of lymphoid, hematopoietic and related tissues: Secondary | ICD-10-CM

## 2022-08-13 DIAGNOSIS — I878 Other specified disorders of veins: Secondary | ICD-10-CM | POA: Diagnosis present

## 2022-08-13 DIAGNOSIS — G629 Polyneuropathy, unspecified: Secondary | ICD-10-CM | POA: Diagnosis present

## 2022-08-13 DIAGNOSIS — I4819 Other persistent atrial fibrillation: Secondary | ICD-10-CM | POA: Diagnosis present

## 2022-08-13 DIAGNOSIS — Z881 Allergy status to other antibiotic agents status: Secondary | ICD-10-CM

## 2022-08-13 DIAGNOSIS — A419 Sepsis, unspecified organism: Secondary | ICD-10-CM | POA: Diagnosis not present

## 2022-08-13 DIAGNOSIS — Z1152 Encounter for screening for COVID-19: Secondary | ICD-10-CM

## 2022-08-13 DIAGNOSIS — E785 Hyperlipidemia, unspecified: Secondary | ICD-10-CM | POA: Diagnosis present

## 2022-08-13 DIAGNOSIS — Z7901 Long term (current) use of anticoagulants: Secondary | ICD-10-CM

## 2022-08-13 DIAGNOSIS — Z8249 Family history of ischemic heart disease and other diseases of the circulatory system: Secondary | ICD-10-CM

## 2022-08-13 DIAGNOSIS — F32A Depression, unspecified: Secondary | ICD-10-CM | POA: Diagnosis present

## 2022-08-13 DIAGNOSIS — I130 Hypertensive heart and chronic kidney disease with heart failure and stage 1 through stage 4 chronic kidney disease, or unspecified chronic kidney disease: Secondary | ICD-10-CM | POA: Diagnosis present

## 2022-08-13 DIAGNOSIS — E872 Acidosis, unspecified: Secondary | ICD-10-CM | POA: Diagnosis present

## 2022-08-13 DIAGNOSIS — I872 Venous insufficiency (chronic) (peripheral): Secondary | ICD-10-CM | POA: Diagnosis present

## 2022-08-13 DIAGNOSIS — E860 Dehydration: Secondary | ICD-10-CM | POA: Diagnosis present

## 2022-08-13 DIAGNOSIS — N320 Bladder-neck obstruction: Secondary | ICD-10-CM | POA: Diagnosis present

## 2022-08-13 DIAGNOSIS — G928 Other toxic encephalopathy: Secondary | ICD-10-CM | POA: Diagnosis present

## 2022-08-13 DIAGNOSIS — E871 Hypo-osmolality and hyponatremia: Secondary | ICD-10-CM | POA: Diagnosis present

## 2022-08-13 DIAGNOSIS — Z888 Allergy status to other drugs, medicaments and biological substances status: Secondary | ICD-10-CM

## 2022-08-13 DIAGNOSIS — N390 Urinary tract infection, site not specified: Secondary | ICD-10-CM | POA: Diagnosis present

## 2022-08-13 DIAGNOSIS — M62838 Other muscle spasm: Secondary | ICD-10-CM | POA: Diagnosis present

## 2022-08-13 DIAGNOSIS — F419 Anxiety disorder, unspecified: Secondary | ICD-10-CM | POA: Diagnosis present

## 2022-08-13 DIAGNOSIS — Z7982 Long term (current) use of aspirin: Secondary | ICD-10-CM

## 2022-08-13 DIAGNOSIS — E861 Hypovolemia: Secondary | ICD-10-CM | POA: Diagnosis present

## 2022-08-13 DIAGNOSIS — N2581 Secondary hyperparathyroidism of renal origin: Secondary | ICD-10-CM | POA: Diagnosis present

## 2022-08-13 DIAGNOSIS — G8929 Other chronic pain: Secondary | ICD-10-CM | POA: Diagnosis present

## 2022-08-13 DIAGNOSIS — Z885 Allergy status to narcotic agent status: Secondary | ICD-10-CM

## 2022-08-13 DIAGNOSIS — I340 Nonrheumatic mitral (valve) insufficiency: Secondary | ICD-10-CM | POA: Diagnosis present

## 2022-08-13 DIAGNOSIS — R296 Repeated falls: Secondary | ICD-10-CM | POA: Diagnosis present

## 2022-08-13 DIAGNOSIS — Z825 Family history of asthma and other chronic lower respiratory diseases: Secondary | ICD-10-CM

## 2022-08-13 DIAGNOSIS — I5032 Chronic diastolic (congestive) heart failure: Secondary | ICD-10-CM | POA: Diagnosis present

## 2022-08-13 DIAGNOSIS — G4733 Obstructive sleep apnea (adult) (pediatric): Secondary | ICD-10-CM | POA: Diagnosis present

## 2022-08-13 LAB — I-STAT VENOUS BLOOD GAS, ED
Acid-base deficit: 13 mmol/L — ABNORMAL HIGH (ref 0.0–2.0)
Bicarbonate: 14.6 mmol/L — ABNORMAL LOW (ref 20.0–28.0)
Calcium, Ion: 0.93 mmol/L — ABNORMAL LOW (ref 1.15–1.40)
HCT: 39 % (ref 39.0–52.0)
Hemoglobin: 13.3 g/dL (ref 13.0–17.0)
O2 Saturation: 22 %
Patient temperature: 36.7
Potassium: 5.8 mmol/L — ABNORMAL HIGH (ref 3.5–5.1)
Sodium: 125 mmol/L — ABNORMAL LOW (ref 135–145)
TCO2: 16 mmol/L — ABNORMAL LOW (ref 22–32)
pCO2, Ven: 36.3 mmHg — ABNORMAL LOW (ref 44–60)
pH, Ven: 7.21 — ABNORMAL LOW (ref 7.25–7.43)
pO2, Ven: 19 mmHg — CL (ref 32–45)

## 2022-08-13 LAB — TROPONIN I (HIGH SENSITIVITY)
Troponin I (High Sensitivity): 15 ng/L (ref <18)
Troponin I (High Sensitivity): 14 ng/L (ref <18)

## 2022-08-13 LAB — CBC WITH DIFFERENTIAL/PLATELET
Abs Immature Granulocytes: 0.18 10*3/uL — ABNORMAL HIGH (ref 0.00–0.07)
Basophils Absolute: 0 10*3/uL (ref 0.0–0.1)
Basophils Relative: 0 %
Eosinophils Absolute: 0.1 10*3/uL (ref 0.0–0.5)
Eosinophils Relative: 0 %
HCT: 35.9 % — ABNORMAL LOW (ref 39.0–52.0)
Hemoglobin: 12 g/dL — ABNORMAL LOW (ref 13.0–17.0)
Immature Granulocytes: 1 %
Lymphocytes Relative: 3 %
Lymphs Abs: 0.6 10*3/uL — ABNORMAL LOW (ref 0.7–4.0)
MCH: 26.8 pg (ref 26.0–34.0)
MCHC: 33.4 g/dL (ref 30.0–36.0)
MCV: 80.1 fL (ref 80.0–100.0)
Monocytes Absolute: 1.2 10*3/uL — ABNORMAL HIGH (ref 0.1–1.0)
Monocytes Relative: 6 %
Neutro Abs: 18.4 10*3/uL — ABNORMAL HIGH (ref 1.7–7.7)
Neutrophils Relative %: 90 %
Platelets: 337 10*3/uL (ref 150–400)
RBC: 4.48 MIL/uL (ref 4.22–5.81)
RDW: 16.5 % — ABNORMAL HIGH (ref 11.5–15.5)
WBC: 20.4 10*3/uL — ABNORMAL HIGH (ref 4.0–10.5)
nRBC: 0 % (ref 0.0–0.2)

## 2022-08-13 LAB — COMPREHENSIVE METABOLIC PANEL
ALT: 59 U/L — ABNORMAL HIGH (ref 0–44)
AST: 39 U/L (ref 15–41)
Albumin: 3.6 g/dL (ref 3.5–5.0)
Alkaline Phosphatase: 96 U/L (ref 38–126)
Anion gap: 19 — ABNORMAL HIGH (ref 5–15)
BUN: 218 mg/dL — ABNORMAL HIGH (ref 8–23)
CO2: 13 mmol/L — ABNORMAL LOW (ref 22–32)
Calcium: 7.7 mg/dL — ABNORMAL LOW (ref 8.9–10.3)
Chloride: 91 mmol/L — ABNORMAL LOW (ref 98–111)
Creatinine, Ser: 10.08 mg/dL — ABNORMAL HIGH (ref 0.61–1.24)
GFR, Estimated: 5 mL/min — ABNORMAL LOW (ref >60)
Glucose, Bld: 101 mg/dL — ABNORMAL HIGH (ref 70–99)
Potassium: 5.9 mmol/L — ABNORMAL HIGH (ref 3.5–5.1)
Sodium: 123 mmol/L — ABNORMAL LOW (ref 135–145)
Total Bilirubin: 0.5 mg/dL (ref 0.3–1.2)
Total Protein: 9.6 g/dL — ABNORMAL HIGH (ref 6.5–8.1)

## 2022-08-13 LAB — URINALYSIS, ROUTINE W REFLEX MICROSCOPIC
Bilirubin Urine: NEGATIVE
Glucose, UA: NEGATIVE mg/dL
Ketones, ur: NEGATIVE mg/dL
Nitrite: NEGATIVE
Protein, ur: 30 mg/dL — AB
Specific Gravity, Urine: 1.012 (ref 1.005–1.030)
WBC, UA: >50 WBC/hpf (ref 0–5)
pH: 5.5 (ref 5.0–8.0)

## 2022-08-13 LAB — AMMONIA: Ammonia: 22 umol/L (ref 9–35)

## 2022-08-13 LAB — LACTIC ACID, PLASMA
Lactic Acid, Venous: 1 mmol/L (ref 0.5–1.9)
Lactic Acid, Venous: 0.8 mmol/L (ref 0.5–1.9)

## 2022-08-13 LAB — TSH: TSH: 1.144 u[IU]/mL (ref 0.350–4.500)

## 2022-08-13 LAB — SODIUM, URINE, RANDOM: Sodium, Ur: 40 mmol/L

## 2022-08-13 LAB — BRAIN NATRIURETIC PEPTIDE: B Natriuretic Peptide: 74.5 pg/mL (ref 0.0–100.0)

## 2022-08-13 LAB — OSMOLALITY, URINE: Osmolality, Ur: 366 mOsm/kg (ref 300–900)

## 2022-08-13 MED ORDER — LACTATED RINGERS IV SOLN: INTRAVENOUS | Status: DC

## 2022-08-13 MED ORDER — LIDOCAINE HCL URETHRAL/MUCOSAL 2 % EX GEL
1.0000 | Freq: Once | CUTANEOUS | Status: AC
  Administered 2022-08-13: 1 via TOPICAL
  Filled 2022-08-13: qty 11

## 2022-08-13 MED ORDER — FENTANYL CITRATE PF 50 MCG/ML IJ SOSY
50.0000 ug | PREFILLED_SYRINGE | Freq: Once | INTRAMUSCULAR | Status: AC
  Administered 2022-08-13: 50 ug via INTRAVENOUS
  Filled 2022-08-13: qty 1

## 2022-08-13 MED ORDER — ONDANSETRON HCL 4 MG/2ML IJ SOLN
4.0000 mg | Freq: Once | INTRAMUSCULAR | Status: AC
  Administered 2022-08-13: 4 mg via INTRAVENOUS
  Filled 2022-08-13: qty 2

## 2022-08-13 MED ORDER — LACTATED RINGERS IV BOLUS
500.0000 mL | Freq: Once | INTRAVENOUS | Status: AC
  Administered 2022-08-13: 500 mL via INTRAVENOUS

## 2022-08-13 MED ORDER — SODIUM ZIRCONIUM CYCLOSILICATE 10 G PO PACK
10.0000 g | PACK | Freq: Two times a day (BID) | ORAL | Status: DC
  Administered 2022-08-13 – 2022-08-14 (×3): 10 g via ORAL
  Filled 2022-08-13 (×3): qty 1

## 2022-08-13 MED ORDER — LIDOCAINE HCL URETHRAL/MUCOSAL 2 % EX GEL
1.0000 | Freq: Once | CUTANEOUS | Status: AC
  Administered 2022-08-13: 1 via URETHRAL

## 2022-08-13 MED ORDER — SODIUM CHLORIDE 0.9 % IV SOLN: INTRAVENOUS | Status: DC

## 2022-08-13 NOTE — ED Notes (Signed)
After catheter insertion, catheter flowing freely, pt reports pain to tip of penis. MD notified and order received. See MAR.

## 2022-08-13 NOTE — ED Provider Notes (Signed)
[facility] Provider Note   CSN: [id number] Arrival date & time: 08/13/22  1615     History  Chief Complaint  Patient presents with   Fatigue    [name] is a 67 y.o. male.  Patient is a 67 year old male with a history of nonischemic cardiomyopathy with an EF of 60 to 65%, chronic kidney disease, chronic wounds on the lower extremities with venous stasis, persistent atrial fibrillation on Eliquis who is presenting today after his PCP called him and told him multiple labs were abnormal and he needs to be seen in the hospital.  Patient's wife reports that he has been diuresing quite well and has lost 50 pounds which she thought was mostly of fluid but also has noted that in the last 3 days he had multiple episodes of diarrhea, refused to eat and has had multiple falls.  He does report that he was recently started on baclofen which makes him feel like he is blacking out when he tries to give up and move around.  He reports that he started the baclofen because he is having terrible cramps in his upper legs.  He still has no appetite and does not want to eat.  He is compliant with all of his medication.  He denies in the falls hitting his head but he is not 100% sure.  He initially denies pain but then with exam moving around he does complain of some left-sided pain.  He denies black stool and reports now his stool is solid again.  He has not had fever that he is aware of.  He denies any cough or shortness of breath.  His wife reports the wounds on his legs are not significantly getting worse.  The right leg is completely healed and the left has been persistent for a year.  The history is provided by the patient.       Home Medications Prior to Admission medications   Medication Sig Start Date End Date Taking? Authorizing Provider  apixaban (ELIQUIS) 5 MG TABS tablet TAKE 1 TABLET BY MOUTH TWICE A DAY 07/25/22   Lelon Perla, MD  aspirin  EC 81 MG tablet Take 81 mg by mouth daily as needed (for chest pain). Swallow whole. Patient not taking: Reported on 05/19/2022    [provider]  carvedilol (COREG) 12.5 MG tablet Take 1 tablet (12.5 mg total) by mouth 2 (two) times daily with a meal. 04/28/22   Hosie Poisson, MD  diltiazem (CARDIZEM CD) 360 MG 24 hr capsule Take 1 capsule (360 mg total) by mouth daily. Please keep scheduled appointment for additional refills. Patient taking differently: Take 360 mg by mouth daily. 04/16/22   Lelon Perla, MD  DULoxetine (CYMBALTA) 60 MG capsule Take 60 mg by mouth 2 (two) times daily. 02/26/21   [provider]  finasteride (PROSCAR) 5 MG tablet Take 1 tablet (5 mg total) by mouth daily. 04/28/22   Hosie Poisson, MD  gabapentin (NEURONTIN) 100 MG capsule Take 1 capsule (100 mg total) by mouth 3 (three) times daily. Patient not taking: Reported on 05/19/2022 04/27/22   Hosie Poisson, MD  LORazepam (ATIVAN) 0.5 MG tablet Take 0.5 mg by mouth every 8 (eight) hours as needed for anxiety. Patient not taking: Reported on 05/19/2022    [provider]  montelukast (SINGULAIR) 10 MG tablet Take 10 mg by mouth at bedtime as needed (for seasonal allergies). Patient not taking: Reported on 05/19/2022  [provider]  omeprazole (PRILOSEC) 20 MG capsule Take 20 mg by mouth daily as needed (for acid reflux).    [provider]  rOPINIRole (REQUIP) 1 MG tablet Take 1-2 tablets (1-2 mg total) by mouth at bedtime as needed (restless leg syndrome). 1 tablet after lunch and 2 tablet at bedtime Patient taking differently: Take 3 mg by mouth at bedtime. 08/07/21   Lelon Perla, MD  tadalafil (CIALIS) 5 MG tablet Take 5 mg by mouth daily as needed (as directed). Patient not taking: Reported on 05/19/2022    [provider]  tamsulosin (FLOMAX) 0.4 MG CAPS capsule Take 1 capsule (0.4 mg total) by mouth daily after supper. 04/27/22   Hosie Poisson, MD   tiZANidine (ZANAFLEX) 4 MG tablet Take 4-8 mg by mouth 3 (three) times daily as needed for muscle spasms. Patient not taking: Reported on 05/19/2022 04/22/22   [provider]      Allergies    Other, Doxycycline hyclate, Gabapentin, Hydrocodone, Hydrocodone-acetaminophen, and Pregabalin    Review of Systems   Review of Systems  Physical Exam Updated Vital Signs BP 108/82 (BP Location: Left Arm)   Pulse 83   Temp 98 F (36.7 C)   Resp 18   SpO2 99%  Physical Exam Vitals and nursing note reviewed.  Constitutional:      General: He is not in acute distress.    Appearance: He is well-developed. He is ill-appearing.  HENT:     Head: Normocephalic and atraumatic.     Mouth/Throat:     Mouth: Mucous membranes are dry.     Comments: Chelitis noticed at the corners of the mouth.  No plaques noted within the mouth. Eyes:     Conjunctiva/sclera: Conjunctivae normal.     Pupils: Pupils are equal, round, and reactive to light.  Cardiovascular:     Rate and Rhythm: Normal rate. Rhythm irregularly irregular.     Heart sounds: No murmur heard. Pulmonary:     Effort: Pulmonary effort is normal. No respiratory distress.     Breath sounds: Normal breath sounds. No wheezing or rales.     Comments: Bruising noted to the lower ribs with tenderness with palpation Chest:     Chest wall: Tenderness present.  Abdominal:     General: There is no distension.     Palpations: Abdomen is soft.     Tenderness: There is abdominal tenderness. There is no guarding or rebound.     Comments: Left lateral mid abdomen tenderness with palpation and guarding  Musculoskeletal:        General: No tenderness. Normal range of motion.     Cervical back: Normal range of motion and neck supple.     Right lower leg: Edema present.     Left lower leg: Edema present.     Comments: trace edema in the lower extremities bilaterally.  Capillary refill of 3 to 4 seconds.  Dusky feet bilaterally.  Palpable  pulse bilaterally at the DP.  Shallow wound present on the anterior distal tib-fib on the left without significant erythema or drainage.  Skin changes consistent with chronic venous stasis.  Skin:    General: Skin is warm and dry.     Coloration: Skin is pale.     Findings: No erythema or rash.  Neurological:     Mental Status: He is alert and oriented to person, place, and time. Mental status is at baseline.  Psychiatric:        Mood  and Affect: Mood normal.        Behavior: Behavior normal.     ED Results / Procedures / Treatments   Labs (all labs ordered are listed, but only abnormal results are displayed) Labs Reviewed  CBC WITH DIFFERENTIAL/PLATELET - Abnormal; Notable for the following components:      Result Value   WBC 20.4 (*)    Hemoglobin 12.0 (*)    HCT 35.9 (*)    RDW 16.5 (*)    Neutro Abs 18.4 (*)    Lymphs Abs 0.6 (*)    Monocytes Absolute 1.2 (*)    Abs Immature Granulocytes 0.18 (*)    All other components within normal limits  COMPREHENSIVE METABOLIC PANEL - Abnormal; Notable for the following components:   Sodium 123 (*)    Potassium 5.9 (*)    Chloride 91 (*)    CO2 13 (*)    Glucose, Bld 101 (*)    BUN 218 (*)    Creatinine, Ser 10.08 (*)    Calcium 7.7 (*)    Total Protein 9.6 (*)    ALT 59 (*)    GFR, Estimated 5 (*)    Anion gap 19 (*)    All other components within normal limits  URINALYSIS, ROUTINE W REFLEX MICROSCOPIC - Abnormal; Notable for the following components:   APPearance CLOUDY (*)    Hgb urine dipstick LARGE (*)    Protein, ur 30 (*)    Leukocytes,Ua LARGE (*)    Bacteria, UA FEW (*)    All other components within normal limits  I-STAT VENOUS BLOOD GAS, ED - Abnormal; Notable for the following components:   pH, Ven 7.210 (*)    pCO2, Ven 36.3 (*)    pO2, Ven 19 (*)    Bicarbonate 14.6 (*)    TCO2 16 (*)    Acid-base deficit 13.0 (*)    Sodium 125 (*)    Potassium 5.8 (*)    Calcium, Ion 0.93 (*)    All other components  within normal limits  BRAIN NATRIURETIC PEPTIDE  LACTIC ACID, PLASMA  LACTIC ACID, PLASMA  AMMONIA  TROPONIN I (HIGH SENSITIVITY)  TROPONIN I (HIGH SENSITIVITY)    EKG EKG Interpretation  Date/Time:  Wednesday August 13 2022 16:33:34 EST Ventricular Rate:  107 PR Interval:    QRS Duration: 102 QT Interval:  340 QTC Calculation: 453 R Axis:   63 Text Interpretation: Atrial fibrillation with rapid ventricular response No significant change since last tracing When compared with ECG of 24-Apr-2022 16:19, PREVIOUS ECG IS PRESENT Confirmed by Blanchie Dessert [phone]) on 08/13/2022 5:31:41 PM  Radiology DG Ribs Unilateral W/Chest Left  Result Date: 08/13/2022 CLINICAL DATA:  Fatigue frequent fall EXAM: LEFT RIBS AND CHEST - 3+ VIEW COMPARISON:  04/24/2022, chest CT 11/17/2018 FINDINGS: Single view chest demonstrates atelectasis or scarring at the lingula. No pleural effusion or pneumothorax. Left rib series demonstrates no acute displaced left rib fracture. Old left fourth through sixth lateral rib fractures. IMPRESSION: No acute displaced left rib fracture. Old left fourth through sixth rib fractures. Negative for pneumothorax or pleural effusion. Atelectasis or scar at the lingula Electronically Signed   By: Donavan Foil M.D.   On: 08/13/2022 18:14    Procedures Procedures    Medications Ordered in ED Medications  lactated ringers bolus 500 mL (has no administration in time range)  lactated ringers infusion (has no administration in time range)    ED Course/ Medical Decision Making/ A&P  Medical Decision Making Amount and/or Complexity of Data Reviewed Independent Historian: spouse External Data Reviewed: notes.    Details: pcp Labs: ordered. Decision-making details documented in ED Course. Radiology: ordered and independent interpretation performed. Decision-making details documented in ED Course. ECG/medicine tests: ordered and independent  interpretation performed. Decision-making details documented in ED Course.  Risk Prescription drug management. Decision regarding hospitalization.   Pt with multiple medical problems and comorbidities and presenting today with a complaint that caries a high risk for morbidity and mortality.  Here today due to complaint of fatigue, diarrhea, poor oral intake.  Apparently patient was seen by Dr. Nancy Fetter yesterday and had abnormal labs however we are not able to get access to any of these labs.  They are not present on the information sent from the office.  Concern for anemia, worsening kidney disease, hyperkalemia, injury from his recurrent falls.  Patient does have bruising and pain to his left side concern for fractured ribs versus splenic injury.  Patient denies any black stool most consistent with GI bleed but does take Eliquis.  Will check labs, chest x-ray and will most likely need imaging.  He has no mental status changes he is awake and alert and denies known head injury with no evidence of bruising or swelling to the head.  8:23 PM I independently interpreted patient's labs and EKG.  EKG without acute findings, labs with CBC with leukocytosis of 20,000, improved hemoglobin of 12, BMP within normal limits today, VBG with metabolic acidosis with a bicarb of 14 and a CO2 of 36 with some respiratory compensation CMP today with acute renal failure with creatinine of 10 from his baseline of 3, BUN of 218, anion gap of 19, hyponatremia of 129 and potassium of 5.9.  Lactic acid within normal limits.  Troponin is negative.  I have independently visualized and interpreted pt's images today.  Chest x-ray without acute findings today.  CT showing signs of bilateral hydronephrosis and bladder outlet obstruction.  Concerned that that is the cause for patient's acute renal failure but also feel that patient is most likely dehydrated as he was not eating or drinking anything.  Patient's last EF was preserved and given  normal BNP feel that patient is dehydrated.  Foley catheter was placed.  Patient was given IV fluids.  Feel that patient requires admission for further care.  Findings discussed with the patient and his wife.  They are comfortable with this plan.          Final Clinical Impression(s) / ED Diagnoses Final diagnoses:  Acute renal failure, unspecified acute renal failure type [facility])  Bladder outlet obstruction  Dehydration  Hyponatremia    Rx / DC Orders ED Discharge Orders     None         Blanchie Dessert, MD 08/13/22 2023

## 2022-08-13 NOTE — ED Notes (Signed)
MD at bedside. Popsicle and beverage provided to pt. Family remains at bedside.

## 2022-08-13 NOTE — ED Notes (Signed)
Attempted report x1. Receiving nurse to call back.

## 2022-08-13 NOTE — ED Notes (Signed)
RN at bedside. Pt appears more comfortable, but still in pain. Requesting more medication. Respirations are equal and nonlabored. Skin warm and dry. MD notified. Blanket provided. Family remains at bedside.

## 2022-08-13 NOTE — ED Notes (Signed)
Pt found standing beside bed. Pt refusing to have side rails up at this time. Redirected to sitting on stretcher. Respirations are equal and nonlabored. Skin warm and dry. Alert and oriented x4.

## 2022-08-13 NOTE — ED Notes (Signed)
RN to bedside for blood collection and to place pt on monitor. At this time pt is refusing everything until he gets something to drink. MD notified. Family remains at bedside.

## 2022-08-13 NOTE — ED Notes (Signed)
Pt leaving with [facility] via stretcher at this time. Alert and oriented x4. Respirations are equal and nonlabored.Skin warm and dry.

## 2022-08-13 NOTE — Telephone Encounter (Signed)
Patient returned Itamar device to office on 08/13/22

## 2022-08-13 NOTE — ED Notes (Signed)
Report provided to Maudie Mercury, RN for [facility]

## 2022-08-13 NOTE — ED Triage Notes (Signed)
Fatigue and not eating, nausea, vomiting  See at [facility] pcp yesterday and told to come for eval. Frequent falls  And redness/cellulitis infecting in lower extremities (x 1 year) Reports that he has lost 50lbs in 3 weeks

## 2022-08-13 NOTE — Progress Notes (Signed)
[name] is a 67 y.o. male with medical history significant for severe morbid obesity, chronic diastolic CHF, CKD stage IV, chronic left lower extremity wound followed by wound care clinic, permanent atrial fibrillation on Eliquis, moderate to severe mitral regurgitation and mild tricuspid regurgitation, hypertension, polyneuropathy, restless leg syndrome, chronic anxiety/depression.  He was started on Bactrim 4 days ago. The patient has been falling a lot recently.  3 days ago he developed diarrhea and then stopped eating and drinking.  He reports issues with urinating.  His wife brought him to the ER.  In the ER patient's creatinine is 7.08, BUN to 18, sodium 123, potassium 5.9, bicarb 13.  White blood count 20.  UA slightly positive. CT abdomen pelvis without contrast shows Severe bilateral hydroureteronephrosis, unchanged from prior exam. Distended urinary bladder with bladder wall thickening and at least one diverticulum. Findings may reflect chronic bladder outlet obstruction.  Physical examination by EDP showed left rib pain and flank pain.  Patient had bruising on the back. Catheter placed in ER, freely draining.  Nephrology and urology consult needed

## 2022-08-14 ENCOUNTER — Encounter (HOSPITAL_COMMUNITY): Payer: Self-pay | Admitting: Internal Medicine

## 2022-08-14 DIAGNOSIS — N390 Urinary tract infection, site not specified: Secondary | ICD-10-CM | POA: Diagnosis present

## 2022-08-14 DIAGNOSIS — Z1152 Encounter for screening for COVID-19: Secondary | ICD-10-CM | POA: Diagnosis not present

## 2022-08-14 DIAGNOSIS — N320 Bladder-neck obstruction: Secondary | ICD-10-CM | POA: Diagnosis not present

## 2022-08-14 DIAGNOSIS — E871 Hypo-osmolality and hyponatremia: Secondary | ICD-10-CM | POA: Diagnosis present

## 2022-08-14 DIAGNOSIS — G928 Other toxic encephalopathy: Secondary | ICD-10-CM | POA: Diagnosis present

## 2022-08-14 DIAGNOSIS — E876 Hypokalemia: Secondary | ICD-10-CM | POA: Diagnosis not present

## 2022-08-14 DIAGNOSIS — F05 Delirium due to known physiological condition: Secondary | ICD-10-CM | POA: Diagnosis present

## 2022-08-14 DIAGNOSIS — N179 Acute kidney failure, unspecified: Secondary | ICD-10-CM

## 2022-08-14 DIAGNOSIS — E860 Dehydration: Secondary | ICD-10-CM | POA: Diagnosis not present

## 2022-08-14 DIAGNOSIS — N189 Chronic kidney disease, unspecified: Secondary | ICD-10-CM | POA: Diagnosis not present

## 2022-08-14 DIAGNOSIS — E872 Acidosis, unspecified: Secondary | ICD-10-CM | POA: Diagnosis present

## 2022-08-14 DIAGNOSIS — E785 Hyperlipidemia, unspecified: Secondary | ICD-10-CM | POA: Diagnosis present

## 2022-08-14 DIAGNOSIS — I130 Hypertensive heart and chronic kidney disease with heart failure and stage 1 through stage 4 chronic kidney disease, or unspecified chronic kidney disease: Secondary | ICD-10-CM | POA: Diagnosis present

## 2022-08-14 DIAGNOSIS — N184 Chronic kidney disease, stage 4 (severe): Secondary | ICD-10-CM | POA: Diagnosis present

## 2022-08-14 DIAGNOSIS — N19 Unspecified kidney failure: Secondary | ICD-10-CM | POA: Diagnosis present

## 2022-08-14 DIAGNOSIS — N2581 Secondary hyperparathyroidism of renal origin: Secondary | ICD-10-CM | POA: Diagnosis present

## 2022-08-14 DIAGNOSIS — I340 Nonrheumatic mitral (valve) insufficiency: Secondary | ICD-10-CM | POA: Diagnosis present

## 2022-08-14 DIAGNOSIS — N136 Pyonephrosis: Secondary | ICD-10-CM | POA: Diagnosis present

## 2022-08-14 DIAGNOSIS — A419 Sepsis, unspecified organism: Secondary | ICD-10-CM | POA: Diagnosis present

## 2022-08-14 DIAGNOSIS — I5032 Chronic diastolic (congestive) heart failure: Secondary | ICD-10-CM | POA: Diagnosis present

## 2022-08-14 DIAGNOSIS — F32A Depression, unspecified: Secondary | ICD-10-CM | POA: Diagnosis present

## 2022-08-14 DIAGNOSIS — E875 Hyperkalemia: Secondary | ICD-10-CM | POA: Diagnosis present

## 2022-08-14 DIAGNOSIS — D631 Anemia in chronic kidney disease: Secondary | ICD-10-CM | POA: Diagnosis present

## 2022-08-14 DIAGNOSIS — L97929 Non-pressure chronic ulcer of unspecified part of left lower leg with unspecified severity: Secondary | ICD-10-CM | POA: Diagnosis present

## 2022-08-14 DIAGNOSIS — I4819 Other persistent atrial fibrillation: Secondary | ICD-10-CM | POA: Diagnosis present

## 2022-08-14 LAB — CBC WITH DIFFERENTIAL/PLATELET
Abs Immature Granulocytes: 0.21 10*3/uL — ABNORMAL HIGH (ref 0.00–0.07)
Basophils Absolute: 0 10*3/uL (ref 0.0–0.1)
Basophils Relative: 0 %
Eosinophils Absolute: 0.1 10*3/uL (ref 0.0–0.5)
Eosinophils Relative: 0 %
HCT: 36.6 % — ABNORMAL LOW (ref 39.0–52.0)
Hemoglobin: 11.8 g/dL — ABNORMAL LOW (ref 13.0–17.0)
Immature Granulocytes: 1 %
Lymphocytes Relative: 3 %
Lymphs Abs: 0.5 10*3/uL — ABNORMAL LOW (ref 0.7–4.0)
MCH: 26.7 pg (ref 26.0–34.0)
MCHC: 32.2 g/dL (ref 30.0–36.0)
MCV: 82.8 fL (ref 80.0–100.0)
Monocytes Absolute: 1 10*3/uL (ref 0.1–1.0)
Monocytes Relative: 5 %
Neutro Abs: 17.5 10*3/uL — ABNORMAL HIGH (ref 1.7–7.7)
Neutrophils Relative %: 91 %
Platelets: 311 10*3/uL (ref 150–400)
RBC: 4.42 MIL/uL (ref 4.22–5.81)
RDW: 16.4 % — ABNORMAL HIGH (ref 11.5–15.5)
WBC: 19.3 10*3/uL — ABNORMAL HIGH (ref 4.0–10.5)
nRBC: 0.1 % (ref 0.0–0.2)

## 2022-08-14 LAB — COMPREHENSIVE METABOLIC PANEL
ALT: 55 U/L — ABNORMAL HIGH (ref 0–44)
AST: 38 U/L (ref 15–41)
Albumin: 2.8 g/dL — ABNORMAL LOW (ref 3.5–5.0)
Alkaline Phosphatase: 90 U/L (ref 38–126)
Anion gap: 18 — ABNORMAL HIGH (ref 5–15)
BUN: 212 mg/dL — ABNORMAL HIGH (ref 8–23)
CO2: 11 mmol/L — ABNORMAL LOW (ref 22–32)
Calcium: 7 mg/dL — ABNORMAL LOW (ref 8.9–10.3)
Chloride: 94 mmol/L — ABNORMAL LOW (ref 98–111)
Creatinine, Ser: 8.98 mg/dL — ABNORMAL HIGH (ref 0.61–1.24)
GFR, Estimated: 6 mL/min — ABNORMAL LOW (ref >60)
Glucose, Bld: 123 mg/dL — ABNORMAL HIGH (ref 70–99)
Potassium: 5.7 mmol/L — ABNORMAL HIGH (ref 3.5–5.1)
Sodium: 123 mmol/L — ABNORMAL LOW (ref 135–145)
Total Bilirubin: 0.7 mg/dL (ref 0.3–1.2)
Total Protein: 8.5 g/dL — ABNORMAL HIGH (ref 6.5–8.1)

## 2022-08-14 LAB — PHOSPHORUS: Phosphorus: 9.2 mg/dL — ABNORMAL HIGH (ref 2.5–4.6)

## 2022-08-14 LAB — OSMOLALITY: Osmolality: 343 mOsm/kg (ref 275–295)

## 2022-08-14 LAB — CK: Total CK: 746 U/L — ABNORMAL HIGH (ref 49–397)

## 2022-08-14 LAB — MAGNESIUM: Magnesium: 2.5 mg/dL — ABNORMAL HIGH (ref 1.7–2.4)

## 2022-08-14 MED ORDER — HYDROXYZINE HCL 25 MG PO TABS
25.0000 mg | ORAL_TABLET | Freq: Three times a day (TID) | ORAL | Status: DC | PRN
  Administered 2022-08-14 – 2022-08-17 (×5): 25 mg via ORAL
  Filled 2022-08-14 (×5): qty 1

## 2022-08-14 MED ORDER — HEPARIN SODIUM (PORCINE) 5000 UNIT/ML IJ SOLN
5000.0000 [IU] | Freq: Three times a day (TID) | INTRAMUSCULAR | Status: DC
  Administered 2022-08-14: 5000 [IU] via SUBCUTANEOUS
  Filled 2022-08-14: qty 1

## 2022-08-14 MED ORDER — SODIUM CHLORIDE 0.9 % IV SOLN
1.0000 g | Freq: Once | INTRAVENOUS | Status: AC
  Administered 2022-08-14: 1 g via INTRAVENOUS
  Filled 2022-08-14: qty 10

## 2022-08-14 MED ORDER — LACTATED RINGERS IV SOLN: INTRAVENOUS | Status: DC

## 2022-08-14 MED ORDER — APIXABAN 5 MG PO TABS
5.0000 mg | ORAL_TABLET | Freq: Two times a day (BID) | ORAL | Status: DC
  Administered 2022-08-14 – 2022-08-18 (×8): 5 mg via ORAL
  Filled 2022-08-14 (×8): qty 1

## 2022-08-14 MED ORDER — STERILE WATER FOR INJECTION IV SOLN
INTRAVENOUS | Status: DC
  Filled 2022-08-14 (×5): qty 1000

## 2022-08-14 MED ORDER — SODIUM CHLORIDE 0.9 % IV SOLN: 1.0000 g | INTRAVENOUS | Status: DC

## 2022-08-14 MED ORDER — SODIUM CHLORIDE 0.9 % IV SOLN: INTRAVENOUS | Status: DC

## 2022-08-14 MED ORDER — ROPINIROLE HCL 1 MG PO TABS
1.0000 mg | ORAL_TABLET | Freq: Every day | ORAL | Status: DC
  Administered 2022-08-15: 1 mg via ORAL
  Filled 2022-08-14 (×2): qty 1

## 2022-08-14 MED ORDER — ORAL CARE MOUTH RINSE: 15.0000 mL | OROMUCOSAL | Status: DC | PRN

## 2022-08-14 MED ORDER — CHLORHEXIDINE GLUCONATE CLOTH 2 % EX PADS
6.0000 | MEDICATED_PAD | Freq: Every day | CUTANEOUS | Status: DC
  Administered 2022-08-15 – 2022-08-18 (×4): 6 via TOPICAL

## 2022-08-14 MED ORDER — ONDANSETRON HCL 4 MG/2ML IJ SOLN: 4.0000 mg | Freq: Four times a day (QID) | INTRAMUSCULAR | Status: DC | PRN

## 2022-08-14 MED ORDER — ONDANSETRON HCL 4 MG PO TABS: 4.0000 mg | ORAL_TABLET | Freq: Four times a day (QID) | ORAL | Status: DC | PRN

## 2022-08-14 MED ORDER — NEPRO/CARBSTEADY PO LIQD: 237.0000 mL | Freq: Three times a day (TID) | ORAL | Status: DC | PRN

## 2022-08-14 MED ORDER — ACETAMINOPHEN 650 MG RE SUPP
650.0000 mg | Freq: Four times a day (QID) | RECTAL | Status: DC | PRN
  Filled 2022-08-14: qty 1

## 2022-08-14 MED ORDER — ACETAMINOPHEN 650 MG RE SUPP: 650.0000 mg | Freq: Four times a day (QID) | RECTAL | Status: DC | PRN

## 2022-08-14 MED ORDER — ROPINIROLE HCL 1 MG PO TABS
4.0000 mg | ORAL_TABLET | Freq: Every day | ORAL | Status: DC
  Administered 2022-08-14: 4 mg via ORAL
  Filled 2022-08-14: qty 4

## 2022-08-14 MED ORDER — HYDRALAZINE HCL 20 MG/ML IJ SOLN: 5.0000 mg | INTRAMUSCULAR | Status: DC | PRN

## 2022-08-14 MED ORDER — DOCUSATE SODIUM 283 MG RE ENEM: 1.0000 | ENEMA | RECTAL | Status: DC | PRN

## 2022-08-14 MED ORDER — HALOPERIDOL LACTATE 5 MG/ML IJ SOLN
5.0000 mg | Freq: Once | INTRAMUSCULAR | Status: AC | PRN
  Administered 2022-08-14: 5 mg via INTRAVENOUS
  Filled 2022-08-14: qty 1

## 2022-08-14 MED ORDER — ROPINIROLE HCL 1 MG PO TABS: 3.0000 mg | ORAL_TABLET | Freq: Every day | ORAL | Status: DC

## 2022-08-14 MED ORDER — CAMPHOR-MENTHOL 0.5-0.5 % EX LOTN
1.0000 | TOPICAL_LOTION | Freq: Three times a day (TID) | CUTANEOUS | Status: DC | PRN
  Filled 2022-08-14: qty 222

## 2022-08-14 MED ORDER — DULOXETINE HCL 60 MG PO CPEP
60.0000 mg | ORAL_CAPSULE | Freq: Two times a day (BID) | ORAL | Status: DC
  Administered 2022-08-14 – 2022-08-15 (×3): 60 mg via ORAL
  Filled 2022-08-14 (×3): qty 1

## 2022-08-14 MED ORDER — HYDROMORPHONE HCL 1 MG/ML IJ SOLN
0.5000 mg | INTRAMUSCULAR | Status: DC | PRN
  Administered 2022-08-14: 0.5 mg via INTRAVENOUS
  Filled 2022-08-14: qty 1

## 2022-08-14 MED ORDER — NALOXONE HCL 0.4 MG/ML IJ SOLN: 0.4000 mg | INTRAMUSCULAR | Status: DC | PRN

## 2022-08-14 MED ORDER — MELATONIN 3 MG PO TABS: 3.0000 mg | ORAL_TABLET | Freq: Every evening | ORAL | Status: DC | PRN

## 2022-08-14 MED ORDER — ACETAMINOPHEN 325 MG PO TABS: 650.0000 mg | ORAL_TABLET | Freq: Four times a day (QID) | ORAL | Status: DC | PRN

## 2022-08-14 MED ORDER — CALCIUM CARBONATE ANTACID 1250 MG/5ML PO SUSP: 500.0000 mg | Freq: Four times a day (QID) | ORAL | Status: DC | PRN

## 2022-08-14 MED ORDER — ACETAMINOPHEN 325 MG PO TABS
650.0000 mg | ORAL_TABLET | Freq: Four times a day (QID) | ORAL | Status: DC | PRN
  Administered 2022-08-14 – 2022-08-17 (×6): 650 mg via ORAL
  Filled 2022-08-14 (×6): qty 2

## 2022-08-14 MED ORDER — SORBITOL 70 % SOLN
30.0000 mL | Status: DC | PRN
  Filled 2022-08-14: qty 30

## 2022-08-14 MED ORDER — HALOPERIDOL LACTATE 5 MG/ML IJ SOLN
2.0000 mg | Freq: Four times a day (QID) | INTRAMUSCULAR | Status: DC | PRN
  Administered 2022-08-14 – 2022-08-16 (×3): 5 mg via INTRAVENOUS
  Filled 2022-08-14 (×3): qty 1

## 2022-08-14 MED ORDER — FINASTERIDE 5 MG PO TABS
5.0000 mg | ORAL_TABLET | Freq: Every day | ORAL | Status: DC
  Administered 2022-08-14 – 2022-08-18 (×5): 5 mg via ORAL
  Filled 2022-08-14 (×5): qty 1

## 2022-08-14 MED ORDER — FENTANYL CITRATE PF 50 MCG/ML IJ SOSY
50.0000 ug | PREFILLED_SYRINGE | INTRAMUSCULAR | Status: DC | PRN
  Administered 2022-08-14: 50 ug via INTRAVENOUS
  Filled 2022-08-14: qty 1

## 2022-08-14 MED ORDER — METOPROLOL TARTRATE 5 MG/5ML IV SOLN: 5.0000 mg | INTRAVENOUS | Status: DC | PRN

## 2022-08-14 MED ORDER — CARVEDILOL 12.5 MG PO TABS
12.5000 mg | ORAL_TABLET | Freq: Two times a day (BID) | ORAL | Status: DC
  Administered 2022-08-14 – 2022-08-18 (×8): 12.5 mg via ORAL
  Filled 2022-08-14 (×8): qty 1

## 2022-08-14 MED ORDER — SODIUM CHLORIDE 0.9% FLUSH
3.0000 mL | Freq: Two times a day (BID) | INTRAVENOUS | Status: DC
  Administered 2022-08-15 – 2022-08-18 (×5): 3 mL via INTRAVENOUS

## 2022-08-14 MED ORDER — DILTIAZEM HCL ER COATED BEADS 180 MG PO CP24
360.0000 mg | ORAL_CAPSULE | Freq: Every day | ORAL | Status: DC
  Administered 2022-08-14 – 2022-08-18 (×5): 360 mg via ORAL
  Filled 2022-08-14 (×7): qty 2

## 2022-08-14 MED ORDER — TAMSULOSIN HCL 0.4 MG PO CAPS
0.4000 mg | ORAL_CAPSULE | Freq: Every day | ORAL | Status: DC
  Administered 2022-08-14 – 2022-08-17 (×4): 0.4 mg via ORAL
  Filled 2022-08-14 (×4): qty 1

## 2022-08-14 NOTE — Progress Notes (Signed)
Patient refused CPAP.

## 2022-08-14 NOTE — Consult Note (Signed)
Reason for Consult: AKI on CKD4 Referring Physician: Karmen Bongo, MD  Chief Complaint: fatigue, n/v  Assessment/Plan: AKI on CKD 4: Presented with creatinine 10.08 (baseline 2.84 in PCP office 10/26 after last hospital discharge), BUN 218, GFR 5. Cr of 11.67 in PCP office prior to presentation to hospital. CT abdomen pelvis reflective of severe bilateral hydroureteronephrosis, distended urinary bladder with bladder wall thickening.  Recent improvement with creatinine 8.98 in the setting of Foley placement and IVF.  Agree with continuing finasteride and tamsulosin for obstructive causes. Somnolent mental status 2/2 recent muscle relaxer use. Continue to monitor.  Hyperkalemia: Persistent mild elevation, no EKG changes, most recently 5.7.  Does not appear to have improved with Lokelma. Likely relating to AKI. Continue Lokelma and monitoring for acute electrolyte indication for dialysis. Hyponatremia: Persistently decreased, most recently 123 with little improvement on fluids. Likely 2/2 hypovolemia. Serum osmolality elevated to 343 2/2 BUN elevation. Continue fluids, hypertonic saline not necessary at this time. Delirium: Normal lactic acid although WBC 20.4 with ANC 18.4.  Blood cultures thus far show no growth x 12 hours. Somnolent during exam as he took muscle relaxer prior to encounter via home supply. Arousable, not suspecting uremic at this time. Continue to monitor for clinical indications of urgent dialysis however does not need currently. Nutrition/Electrolytes: See hyponatremia, hyperkalemia above.  Patient also presents with hypocalcemia 7.0, hyperphosphatemia 9.2, hypoalbuminemia 2.8.  Suspect component of secondary hyperPTH. Check vitamin D. Anion gap metabolic acidosis: suspect related to AKI. Adjusted fluids to sodium bicarb.  A-fib: Per primary service HTN: Per primary service Anemia: Unspecified cause at this time.  Was normal since June 2023, however has been decreased with minimum  of 9.3 since October 2023.  Normal ferritin but low iron and sat ratios at that time.  Presented with hemoglobin 12.0 upon hospitalization.  Suspect component of anemia of chronic disease.   HPI: DARRALL STREY is an 67 y.o. male w/ pmhx of CKD4, A-fib on Eliquis, HTN, HLD, chronic diastolic CHF (LV EF 60 to 65%), BPH, venous stasis wounds of lower extremities, OSA noncompliant on CPAP.  Patient was admitted from [facility] and then transferred to [facility] for AKI superimposed on CKD 4. Entire history provided by patient's spouse: In the last several days, patient has been experiencing multiple episodes of diarrhea, refusing to eat in addition to multiple falls.  She notes that he was seen by PCP in the last couple days and was advised to proceed to the ED because his lab workup was terrible.  She notes that at home he is mostly in pain and while he does take his medications, he is poorly compliant with medical care.  He has history of taking 15-20 tabs of ibuprofen daily for the last several years relating to his lower extremity pain.  He was told not to take this anymore but she is unsure if he has been compliant with this.  He has diuresed significantly while taking torsemide.  She provided all medications that he has availability to which did not include finasteride nor tamsulosin.  At baseline, he is able to converse but gets fatigued easily with taking his muscle relaxers.  He has not been drinking as well at home.  States that for the last several days his urine has been limited, frothy, caramel colored and he has been complaining of being dehydrated.  Chemistry and CBC: Creat  Date/Time Value Ref Range Status  12/11/2021 02:47 PM 2.05 (H) 0.70 - 1.35 mg/dL Final  11/15/2015 08:47 AM  1.10 0.70 - 1.33 mg/dL Final   Creatinine, Ser  Date/Time Value Ref Range Status  08/14/2022 03:35 AM 8.98 (H) 0.61 - 1.24 mg/dL Final  08/13/2022 05:53 PM 10.08 (H) 0.61 - 1.24 mg/dL Final  04/28/2022 04:07 AM 3.50 (H)  0.61 - 1.24 mg/dL Final  04/27/2022 04:34 AM 3.61 (H) 0.61 - 1.24 mg/dL Final  04/26/2022 11:25 AM 3.90 (H) 0.61 - 1.24 mg/dL Final  04/25/2022 11:07 AM 4.52 (H) 0.61 - 1.24 mg/dL Final  04/25/2022 05:56 AM 4.68 (H) 0.61 - 1.24 mg/dL Final  04/24/2022 05:07 PM 5.50 (H) 0.61 - 1.24 mg/dL Final  04/24/2022 04:46 PM 4.98 (H) 0.61 - 1.24 mg/dL Final  04/21/2021 03:40 AM 1.40 (H) 0.61 - 1.24 mg/dL Final  04/20/2021 03:49 AM 1.32 (H) 0.61 - 1.24 mg/dL Final  04/19/2021 03:52 AM 1.29 (H) 0.61 - 1.24 mg/dL Final  04/18/2021 01:13 AM 1.19 0.61 - 1.24 mg/dL Final  04/17/2021 01:33 AM 1.24 0.61 - 1.24 mg/dL Final  04/16/2021 04:07 PM 0.94 0.61 - 1.24 mg/dL Final  04/27/2020 04:01 AM 0.97 0.61 - 1.24 mg/dL Final  04/26/2020 04:09 AM 0.92 0.61 - 1.24 mg/dL Final  04/25/2020 04:32 AM 0.91 0.61 - 1.24 mg/dL Final  04/24/2020 07:08 PM 0.87 0.61 - 1.24 mg/dL Final  06/27/2019 04:02 PM 1.60 (H) 0.76 - 1.27 mg/dL Final  05/02/2019 11:37 AM 1.13 0.61 - 1.24 mg/dL Final  03/03/2019 03:51 PM 1.17 0.76 - 1.27 mg/dL Final  11/17/2018 01:24 PM 0.78 0.61 - 1.24 mg/dL Final  03/31/2017 02:01 PM 1.06 0.76 - 1.27 mg/dL Final  03/23/2017 04:55 PM 1.13 0.76 - 1.27 mg/dL Final  01/13/2013 09:26 AM 1.00 0.50 - 1.35 mg/dL Final  01/13/2013 09:20 AM 1.06 0.50 - 1.35 mg/dL Final  08/18/2012 01:42 PM 1.00 0.50 - 1.35 mg/dL Final  08/11/2011 08:22 AM 0.84 0.50 - 1.35 mg/dL Final  06/16/2011 01:07 PM 1.1 0.4 - 1.5 mg/dL Final  06/07/2009 11:40 AM 1.0 0.4 - 1.5 mg/dL Final  02/17/2009 05:39 PM 0.9 0.4 - 1.5 mg/dL Final  02/14/2009 06:30 AM 1.08 0.4 - 1.5 mg/dL Final  02/13/2009 05:26 AM 1.22 0.4 - 1.5 mg/dL Final   Recent Labs  Lab 08/13/22 1753 08/13/22 1803 08/14/22 0335  NA 123* 125* 123*  K 5.9* 5.8* 5.7*  CL 91*  --  94*  CO2 13*  --  11*  GLUCOSE 101*  --  123*  BUN 218*  --  212*  CREATININE 10.08*  --  8.98*  CALCIUM 7.7*  --  7.0*  PHOS  --   --  9.2*   Recent Labs  Lab 08/13/22 1753  08/13/22 1803 08/14/22 0335  WBC 20.4*  --  19.3*  NEUTROABS 18.4*  --  17.5*  HGB 12.0* 13.3 11.8*  HCT 35.9* 39.0 36.6*  MCV 80.1  --  82.8  PLT 337  --  311   Liver Function Tests: Recent Labs  Lab 08/13/22 1753 08/14/22 0335  AST 39 38  ALT 59* 55*  ALKPHOS 96 90  BILITOT 0.5 0.7  PROT 9.6* 8.5*  ALBUMIN 3.6 2.8*   No results for input(s): "LIPASE", "AMYLASE" in the last 168 hours. Recent Labs  Lab 08/13/22 1815  AMMONIA 22   Cardiac Enzymes: Recent Labs  Lab 08/14/22 0335  CKTOTAL 746*   Iron Studies: No results for input(s): "IRON", "TIBC", "TRANSFERRIN", "FERRITIN" in the last 72 hours. PT/INR: '@[email or web address]'$ (inr:5)  Xrays/Other Studies: ) Results for orders placed or performed during the hospital encounter of  08/13/22 (from the past 48 hour(s))  CBC with Differential/Platelet     Status: Abnormal   Collection Time: 08/13/22  5:53 PM  Result Value Ref Range   WBC 20.4 (H) 4.0 - 10.5 K/uL   RBC 4.48 4.22 - 5.81 MIL/uL   Hemoglobin 12.0 (L) 13.0 - 17.0 g/dL   HCT 35.9 (L) 39.0 - 52.0 %   MCV 80.1 80.0 - 100.0 fL   MCH 26.8 26.0 - 34.0 pg   MCHC 33.4 30.0 - 36.0 g/dL   RDW 16.5 (H) 11.5 - 15.5 %   Platelets 337 150 - 400 K/uL   nRBC 0.0 0.0 - 0.2 %   Neutrophils Relative % 90 %   Neutro Abs 18.4 (H) 1.7 - 7.7 K/uL   Lymphocytes Relative 3 %   Lymphs Abs 0.6 (L) 0.7 - 4.0 K/uL   Monocytes Relative 6 %   Monocytes Absolute 1.2 (H) 0.1 - 1.0 K/uL   Eosinophils Relative 0 %   Eosinophils Absolute 0.1 0.0 - 0.5 K/uL   Basophils Relative 0 %   Basophils Absolute 0.0 0.0 - 0.1 K/uL   Immature Granulocytes 1 %   Abs Immature Granulocytes 0.18 (H) 0.00 - 0.07 K/uL    Comment: Performed at [facility], [address] [place], [place] [address]  Brain natriuretic peptide     Status: None   Collection Time: 08/13/22  5:53 PM  Result Value Ref Range   B Natriuretic Peptide 74.5 0.0 - 100.0 pg/mL    Comment: Performed at [facility], [address], [place], [place] [address]  Comprehensive metabolic panel     Status: Abnormal   Collection Time: 08/13/22  5:53 PM  Result Value Ref Range   Sodium 123 (L) 135 - 145 mmol/L   Potassium 5.9 (H) 3.5 - 5.1 mmol/L    Comment: NO VISIBLE HEMOLYSIS   Chloride 91 (L) 98 - 111 mmol/L   CO2 13 (L) 22 - 32 mmol/L   Glucose, Bld 101 (H) 70 - 99 mg/dL    Comment: Glucose reference range applies only to samples taken after fasting for at least 8 hours.   BUN 218 (H) 8 - 23 mg/dL   Creatinine, Ser 10.08 (H) 0.61 - 1.24 mg/dL   Calcium 7.7 (L) 8.9 - 10.3 mg/dL   Total Protein 9.6 (H) 6.5 - 8.1 g/dL   Albumin 3.6 3.5 - 5.0 g/dL   AST 39 15 - 41 U/L   ALT 59 (H) 0 - 44 U/L   Alkaline Phosphatase 96 38 - 126 U/L   Total Bilirubin 0.5 0.3 - 1.2 mg/dL   GFR, Estimated 5 (L) >60 mL/min    Comment: (NOTE) Calculated using the CKD-EPI Creatinine Equation (2021)    Anion gap 19 (H) 5 - 15    Comment: Performed at [facility], [place], [place] [address]  Lactic acid, plasma     Status: None   Collection Time: 08/13/22  5:53 PM  Result Value Ref Range   Lactic Acid, Venous 1.0 0.5 - 1.9 mmol/L    Comment: Performed at [facility], [place], [place] [address]  Troponin I (High Sensitivity)     Status: None   Collection Time: 08/13/22  5:53 PM  Result Value Ref Range   Troponin I (High Sensitivity) 15 <18 ng/L    Comment: (NOTE) Elevated high sensitivity troponin I (hsTnI) values and significant  changes across serial measurements may suggest  ACS but many other  chronic and acute conditions are known to elevate hsTnI results.  Refer to the "Links" section for chest pain algorithms and additional  guidance. Performed at [facility], [address], [place], [place] [address]   I-Stat venous blood gas, ED     Status: Abnormal   Collection Time: 08/13/22  6:03 PM   Result Value Ref Range   pH, Ven 7.210 (L) 7.25 - 7.43   pCO2, Ven 36.3 (L) 44 - 60 mmHg   pO2, Ven 19 (LL) 32 - 45 mmHg   Bicarbonate 14.6 (L) 20.0 - 28.0 mmol/L   TCO2 16 (L) 22 - 32 mmol/L   O2 Saturation 22 %   Acid-base deficit 13.0 (H) 0.0 - 2.0 mmol/L   Sodium 125 (L) 135 - 145 mmol/L   Potassium 5.8 (H) 3.5 - 5.1 mmol/L   Calcium, Ion 0.93 (L) 1.15 - 1.40 mmol/L   HCT 39.0 39.0 - 52.0 %   Hemoglobin 13.3 13.0 - 17.0 g/dL   Patient temperature 36.7 C    Collection site IV start    Drawn by Nurse    Sample type VENOUS    Comment NOTIFIED PHYSICIAN   Ammonia     Status: None   Collection Time: 08/13/22  6:15 PM  Result Value Ref Range   Ammonia 22 9 - 35 umol/L    Comment: Performed at [facility], [address] [place], [place] [address]  Lactic acid, plasma     Status: None   Collection Time: 08/13/22  7:30 PM  Result Value Ref Range   Lactic Acid, Venous 0.8 0.5 - 1.9 mmol/L    Comment: Performed at [facility], [place], [place] [address]  Troponin I (High Sensitivity)     Status: None   Collection Time: 08/13/22  7:30 PM  Result Value Ref Range   Troponin I (High Sensitivity) 14 <18 ng/L    Comment: (NOTE) Elevated high sensitivity troponin I (hsTnI) values and significant  changes across serial measurements may suggest ACS but many other  chronic and acute conditions are known to elevate hsTnI results.  Refer to the "Links" section for chest pain algorithms and additional  guidance. Performed at [facility], [address] [place], [place] [address]   Urinalysis, Routine w reflex microscopic -Urine, Clean Catch     Status: Abnormal   Collection Time: 08/13/22  7:45 PM  Result Value Ref Range   Color, Urine YELLOW YELLOW   APPearance CLOUDY (A) CLEAR   Specific Gravity, Urine 1.012 1.005 - 1.030   pH 5.5 5.0 - 8.0   Glucose, UA NEGATIVE NEGATIVE mg/dL   Hgb urine dipstick  LARGE (A) NEGATIVE   Bilirubin Urine NEGATIVE NEGATIVE   Ketones, ur NEGATIVE NEGATIVE mg/dL   Protein, ur 30 (A) NEGATIVE mg/dL   Nitrite NEGATIVE NEGATIVE   Leukocytes,Ua LARGE (A) NEGATIVE   RBC / HPF 0-5 0 - 5 RBC/hpf   WBC, UA >50 0 - 5 WBC/hpf   Bacteria, UA FEW (A) NONE SEEN   Squamous Epithelial / HPF 0-5 0 - 5 /HPF    Comment: Performed at [facility], [address] [place], [place] [address]  Sodium, urine, random     Status: None   Collection Time: 08/13/22  7:45 PM  Result Value Ref Range   Sodium, Ur 40 mmol/L    Comment: Performed at [facility], [address], [place], [place]  [address]  Osmolality, urine     Status: None   Collection Time: 08/13/22  7:45 PM  Result Value Ref Range   Osmolality, Ur 366 300 - 900 mOsm/kg    Comment: Performed at [facility] [address] [place], [place] [address]  Blood culture (routine x 2)     Status: None (Preliminary result)   Collection Time: 08/13/22  9:18 PM   Specimen: BLOOD  Result Value Ref Range   Specimen Description      BLOOD BLOOD RIGHT FOREARM Performed at [facility], [address], [place], [place] [address]    Special Requests      BOTTLES DRAWN AEROBIC AND ANAEROBIC Blood Culture adequate volume Performed at [facility], [address], [place], [place] [address]    Culture      NO GROWTH < 12 HOURS Performed at [facility] [address], [place], [place] [address]    Report Status PENDING   Osmolality     Status: Abnormal   Collection Time: 08/13/22  9:29 PM  Result Value Ref Range   Osmolality 343 (HH) 275 - 295 mOsm/kg    Comment: CRITICAL RESULT CALLED TO, READ BACK BY AND VERIFIED WITH: RN Earleen Reaper [id number] '@[id number]'$  THANEY Performed at [facility] [address], [place], [place] [address]   TSH     Status: None   Collection Time: 08/13/22  9:29 PM  Result Value Ref Range   TSH 1.144 0.350 -  4.500 uIU/mL    Comment: Performed by a 3rd Generation assay with a functional sensitivity of <=0.01 uIU/mL. Performed at [facility], [address] [place], [place] [address]   CBC with Differential/Platelet     Status: Abnormal   Collection Time: 08/14/22  3:35 AM  Result Value Ref Range   WBC 19.3 (H) 4.0 - 10.5 K/uL   RBC 4.42 4.22 - 5.81 MIL/uL   Hemoglobin 11.8 (L) 13.0 - 17.0 g/dL   HCT 36.6 (L) 39.0 - 52.0 %   MCV 82.8 80.0 - 100.0 fL   MCH 26.7 26.0 - 34.0 pg   MCHC 32.2 30.0 - 36.0 g/dL   RDW 16.4 (H) 11.5 - 15.5 %   Platelets 311 150 - 400 K/uL   nRBC 0.1 0.0 - 0.2 %   Neutrophils Relative % 91 %   Neutro Abs 17.5 (H) 1.7 - 7.7 K/uL   Lymphocytes Relative 3 %   Lymphs Abs 0.5 (L) 0.7 - 4.0 K/uL   Monocytes Relative 5 %   Monocytes Absolute 1.0 0.1 - 1.0 K/uL   Eosinophils Relative 0 %   Eosinophils Absolute 0.1 0.0 - 0.5 K/uL   Basophils Relative 0 %   Basophils Absolute 0.0 0.0 - 0.1 K/uL   Immature Granulocytes 1 %   Abs Immature Granulocytes 0.21 (H) 0.00 - 0.07 K/uL    Comment: Performed at [facility], [address] [place], [place] [address]  Comprehensive metabolic panel     Status: Abnormal   Collection Time: 08/14/22  3:35 AM  Result Value Ref Range   Sodium 123 (L) 135 - 145 mmol/L   Potassium 5.7 (H) 3.5 - 5.1 mmol/L   Chloride 94 (L) 98 - 111 mmol/L   CO2 11 (L) 22 - 32 mmol/L   Glucose, Bld 123 (H) 70 - 99 mg/dL    Comment: Glucose reference range applies only to samples taken after fasting for at least 8  hours.   BUN 212 (H) 8 - 23 mg/dL   Creatinine, Ser 8.98 (H) 0.61 - 1.24 mg/dL   Calcium 7.0 (L) 8.9 - 10.3 mg/dL   Total Protein 8.5 (H) 6.5 - 8.1 g/dL   Albumin 2.8 (L) 3.5 - 5.0 g/dL   AST 38 15 - 41 U/L   ALT 55 (H) 0 - 44 U/L   Alkaline Phosphatase 90 38 - 126 U/L   Total Bilirubin 0.7 0.3 - 1.2 mg/dL   GFR, Estimated 6 (L) >60 mL/min    Comment: (NOTE) Calculated using the CKD-EPI Creatinine Equation  (2021)    Anion gap 18 (H) 5 - 15    Comment: Performed at [facility], [place] [address], [place], [place] [address]  Magnesium     Status: Abnormal   Collection Time: 08/14/22  3:35 AM  Result Value Ref Range   Magnesium 2.5 (H) 1.7 - 2.4 mg/dL    Comment: Performed at [facility] [address], [place], [place] [address]  Phosphorus     Status: Abnormal   Collection Time: 08/14/22  3:35 AM  Result Value Ref Range   Phosphorus 9.2 (H) 2.5 - 4.6 mg/dL    Comment: Performed at [facility] [address], [place], [place] [address]  CK     Status: Abnormal   Collection Time: 08/14/22  3:35 AM  Result Value Ref Range   Total CK 746 (H) 49 - 397 U/L    Comment: Performed at [facility], [place] [address], [place], [place] [address]   CT ABDOMEN PELVIS WO CONTRAST  Result Date: 08/13/2022 CLINICAL DATA:  Left lower quadrant abdominal pain. EXAM: CT ABDOMEN AND PELVIS WITHOUT CONTRAST TECHNIQUE: Multidetector CT imaging of the abdomen and pelvis was performed following the standard protocol without IV contrast. RADIATION DOSE REDUCTION: This exam was performed according to the departmental dose-optimization program which includes automated exposure control, adjustment of the mA and/or kV according to patient size and/or use of iterative reconstruction technique. COMPARISON:  CT abdomen pelvis dated 04/24/2022. FINDINGS: Lower chest: No acute abnormality. Hepatobiliary: No focal liver abnormality is seen. No gallstones, gallbladder wall thickening, or biliary dilatation. Pancreas: Unremarkable. No pancreatic ductal dilatation or surrounding inflammatory changes. Spleen: Normal in size without focal abnormality. Adrenals/Urinary Tract: Adrenal glands are unremarkable. A 9 cm cyst in the right kidney is redemonstrated. No imaging follow-up is recommended for this finding. No renal calculi are seen on either side there is severe bilateral hydroureteronephrosis, unchanged from  prior exam. The urinary bladder is distended and demonstrates bladder wall thickening with at least one diverticulum. Stomach/Bowel: Stomach is within normal limits. Appendix appears normal. No evidence of bowel wall thickening, distention, or inflammatory changes. Vascular/Lymphatic: Aortic atherosclerosis. No enlarged abdominal or pelvic lymph nodes. Reproductive: The prostate is enlarged, measuring 5.2 cm in transverse dimension. This results in mass effect on the bladder. Other: No abdominal wall hernia or abnormality. No abdominopelvic ascites. Musculoskeletal: Degenerative changes are seen in the spine. IMPRESSION: 1. No acute findings in the abdomen or pelvis. 2. Severe bilateral hydroureteronephrosis, unchanged from prior exam. Distended urinary bladder with bladder wall thickening and at least one diverticulum. Findings may reflect chronic bladder outlet obstruction. Aortic Atherosclerosis (ICD10-I70.0). Electronically Signed   By: Zerita Boers M.D.   On: 08/13/2022 19:30   DG Ribs Unilateral W/Chest Left  Result Date: 08/13/2022 CLINICAL DATA:  Fatigue frequent fall EXAM: LEFT RIBS AND CHEST - 3+ VIEW COMPARISON:  04/24/2022, chest CT 11/17/2018 FINDINGS: Single view chest demonstrates atelectasis or scarring at the lingula. No pleural effusion or pneumothorax. Left rib series demonstrates no acute displaced left rib fracture. Old left fourth through sixth lateral rib fractures. IMPRESSION: No acute displaced left rib fracture. Old left fourth through sixth rib fractures. Negative for pneumothorax or pleural effusion. Atelectasis or scar at the lingula Electronically Signed   By: Donavan Foil M.D.   On: 08/13/2022 18:14    PMH:   Past Medical History:  Diagnosis Date   Allergic rhinitis    Anxiety    Asthma    as a child   Chronic low back pain    Complication of anesthesia    "hard to put asleep, hard to wake up, nausea and vomiting"   Depression    ED (erectile dysfunction)    GERD  (gastroesophageal reflux disease)    HLD (hyperlipidemia)    HTN (hypertension)    sees Dr. [name], [facility]   Hypertrophy of prostate with urinary obstruction and other lower urinary tract symptoms (LUTS)    IBS (irritable bowel syndrome)    Inguinal hernia without mention of obstruction or gangrene, unilateral or unspecified, (not specified as recurrent)    Insomnia    Lumbar herniated disc    L7   Morbid obesity ([place])    Narcotic addiction ([place])    NICM (nonischemic cardiomyopathy) ([place])    tachycardia mediated,  resolved with sinus   Persistent atrial fibrillation ([place])    ablation done 03/2006 at [facility]   Pneumonia    hx of 2004   PONV (postoperative nausea and vomiting)    Twitching    legs   PSH:   Past Surgical History:  Procedure Laterality Date   ATRIAL ABLATION SURGERY  2007   [facility]   CARDIOVERSION  08/11/2011   Procedure: CARDIOVERSION;  Surgeon: Lelon Perla, MD;  Location: [facility];  Service: Cardiovascular;  Laterality: N/A;   CARDIOVERSION N/A 04/16/2017   Procedure: CARDIOVERSION;  Surgeon: Sanda Klein, MD;  Location: [facility] ENDOSCOPY;  Service: Cardiovascular;  Laterality: N/A;   CARDIOVERSION N/A 03/04/2019   Procedure: CARDIOVERSION;  Surgeon: Lelon Perla, MD;  [facility] ENDOSCOPY;  Service: Cardiovascular;  Laterality: N/A;   CARDIOVERSION N/A 07/04/2019   Procedure: CARDIOVERSION;  Surgeon: [name], MD;  Location: [facility] ENDOSCOPY;  Service: Cardiovascular;  Laterality: N/A;   EYE SURGERY     lasik, [place] Right 08/26/2012   Procedure: LAPAROSCOPIC INGUINAL HERNIA;  Surgeon: Madilyn Hook, DO;  Location: [facility];  Service: General;  Laterality: Right;  laparoscopic right inguinal hernia repair with mesh, umbilical hernia repair   INSERTION OF MESH Right 08/26/2012   Procedure: INSERTION OF MESH;  Surgeon: Madilyn Hook, DO;  Location: [facility];  Service: General;  Laterality: Right;  right  inguinal hernia   TEE WITHOUT CARDIOVERSION N/A 03/04/2019   Procedure: TRANSESOPHAGEAL ECHOCARDIOGRAM (TEE);  Surgeon: Lelon Perla, MD;  Location: [facility];  Service: Cardiovascular;  Laterality: N/A;   UMBILICAL HERNIA REPAIR N/A 08/26/2012   Procedure: HERNIA REPAIR UMBILICAL ADULT;  Surgeon: Madilyn Hook, DO;  Location: [place] [facility];  Service: General;  Laterality: N/A;    Allergies:  Allergies  Allergen Reactions   Other Other (See Comments)    "Hycomine" cough syrup = dizziness   Doxycycline Hyclate     Other Reaction(s): Flu-like symptoms   Gabapentin  Other Reaction(s): swelling in feet   Hydrocodone Other (See Comments)    GI upset, dizziness   Hydrocodone-Acetaminophen     Other Reaction(s): GI upset, Dizziness   Pregabalin     Other Reaction(s): swelling, severe dry mouth    Medications:   Prior to Admission medications   Medication Sig Start Date End Date Taking? Authorizing Provider  apixaban (ELIQUIS) 5 MG TABS tablet TAKE 1 TABLET BY MOUTH TWICE A DAY 07/25/22   Lelon Perla, MD  carvedilol (COREG) 12.5 MG tablet Take 1 tablet (12.5 mg total) by mouth 2 (two) times daily with a meal. 04/28/22   Hosie Poisson, MD  diltiazem (CARDIZEM CD) 360 MG 24 hr capsule Take 1 capsule (360 mg total) by mouth daily. Please keep scheduled appointment for additional refills. Patient taking differently: Take 360 mg by mouth daily. 04/16/22   Lelon Perla, MD  DULoxetine (CYMBALTA) 60 MG capsule Take 60 mg by mouth 2 (two) times daily. 02/26/21   [provider]  finasteride (PROSCAR) 5 MG tablet Take 1 tablet (5 mg total) by mouth daily. 04/28/22   Hosie Poisson, MD  omeprazole (PRILOSEC) 20 MG capsule Take 20 mg by mouth daily as needed (for acid reflux).    [provider]  rOPINIRole (REQUIP) 1 MG tablet Take 1-2 tablets (1-2 mg total) by mouth at bedtime as needed (restless leg syndrome). 1 tablet after lunch and 2 tablet at bedtime Patient taking  differently: Take 3 mg by mouth at bedtime. 08/07/21   Lelon Perla, MD  tamsulosin (FLOMAX) 0.4 MG CAPS capsule Take 1 capsule (0.4 mg total) by mouth daily after supper. 04/27/22   Hosie Poisson, MD    Discontinued Meds:   Medications Discontinued During This Encounter  Medication Reason   lactated ringers infusion    0.9 %  sodium chloride infusion    fentaNYL (SUBLIMAZE) injection 50 mcg    melatonin tablet 3 mg    acetaminophen (TYLENOL) tablet 650 mg    acetaminophen (TYLENOL) suppository 650 mg    metoprolol tartrate (LOPRESSOR) injection 5 mg    naloxone (NARCAN) injection 0.4 mg    ondansetron (ZOFRAN) injection 4 mg    lactated ringers infusion    HYDROmorphone (DILAUDID) injection 0.5 mg    tiZANidine (ZANAFLEX) 4 MG tablet    LORazepam (ATIVAN) 0.5 MG tablet    tadalafil (CIALIS) 5 MG tablet    montelukast (SINGULAIR) 10 MG tablet    aspirin EC 81 MG tablet    gabapentin (NEURONTIN) 100 MG capsule    rOPINIRole (REQUIP) tablet 3 mg     Social History:  reports that he has never smoked. He has never used smokeless tobacco. He reports that he does not drink alcohol and does not use drugs.  Family History:   Family History  Problem Relation Age of Onset   Asthma Mother    Breast cancer Mother    Hypertension Mother    COPD Father    Prostate cancer Father    Hypertension Father    Lymphoma Sister     Blood pressure (!) 139/90, pulse (!) 109, temperature (!) 97.3 F (36.3 C), temperature source Oral, resp. rate 18, height 6' 4.5" (1.943 m), weight 123.4 kg, SpO2 99 %. General appearance: fatigued, no distress, and moderately obese Head: Normocephalic, without obvious abnormality, atraumatic Eyes: conjunctivae/corneas clear. PERRL, EOM's intact. Fundi benign. Resp: clear to auscultation bilaterally and no rubs, difficult to appreciate given body habitus Cardio: irregularly irregular rhythm  and no rub GI: soft, non-tender; bowel sounds normal; no masses,  no  organomegaly Extremities: chronic venous stasis wounds, no edema appreciated Pulses: 2+ and symmetric Pedal pulses Neurologic: Mental status: somnolent but arousable briefly, A&Ox3 when awake, quickly able to fall back asleep  Wells Guiles, DO 08/14/2022, 9:13 AM

## 2022-08-14 NOTE — H&P (Signed)
History and Physical    Patient: [name] DOB: 11-18-1955 DOA: 08/13/2022 DOS: the patient was seen and examined on 08/14/2022 PCP: [name], MD (Inactive)  Patient coming from: Home - lives with wife; NOK: Wife, Robie Mcniel, [phone]   Chief Complaint: Fatigue, n/v  HPI: [name] is a 67 y.o. male with medical history significant of stage 4 CKD, afib on Eliquis, chronic diastolic CHF,  HTN, HLD, and BPH presenting with fatigue, n/v.  He is currently complaining of leg cramps.  According to the nurse, he has had an array of complaints and has been given a multitude of medications but he will not stay in bed, is agitated, pulled out IV, etc.  Once the patient settled down, I was able to get him back into bed and he immediately fell asleep.  He provided little in the way of history.    ER Course:  DB to [facility] transfer, per Dr. Claria Dice:  He was started on Bactrim 4 days ago. The patient has been falling a lot recently.  3 days ago he developed diarrhea and then stopped eating and drinking.  He reports issues with urinating.  His wife brought him to the ER.  In the ER patient's creatinine is 7.08, BUN to 18, sodium 123, potassium 5.9, bicarb 13.  White blood count 20.  UA slightly positive. CT abdomen pelvis without contrast shows Severe bilateral hydroureteronephrosis, unchanged from prior exam. Distended urinary bladder with bladder wall thickening and at least one diverticulum. Findings may reflect chronic bladder outlet obstruction.  Physical examination by EDP showed left rib pain and flank pain.  Patient had bruising on the back. Catheter placed in ER, freely draining.   Nephrology and urology consult needed     Review of Systems: Unable to effectively perform   Past Medical History:  Diagnosis Date   Allergic rhinitis    Anxiety    Asthma    as a child   Chronic low back pain    Complication of anesthesia    "hard to put asleep, hard  to wake up, nausea and vomiting"   Depression    ED (erectile dysfunction)    GERD (gastroesophageal reflux disease)    HLD (hyperlipidemia)    HTN (hypertension)    sees Dr. [name], [facility]   Hypertrophy of prostate with urinary obstruction and other lower urinary tract symptoms (LUTS)    IBS (irritable bowel syndrome)    Inguinal hernia without mention of obstruction or gangrene, unilateral or unspecified, (not specified as recurrent)    Insomnia    Lumbar herniated disc    L7   Morbid obesity ([place])    Narcotic addiction ([place])    NICM (nonischemic cardiomyopathy) ([place])    tachycardia mediated,  resolved with sinus   Persistent atrial fibrillation ([place])    ablation done 03/2006 at [facility]   Pneumonia    hx of 2004   PONV (postoperative nausea and vomiting)    Twitching    legs   Past Surgical History:  Procedure Laterality Date   ATRIAL ABLATION SURGERY  2007   [facility]   CARDIOVERSION  08/11/2011   Procedure: CARDIOVERSION;  Surgeon: Lelon Perla, MD;  Location: [place];  Service: Cardiovascular;  Laterality: N/A;   CARDIOVERSION N/A 04/16/2017   Procedure: CARDIOVERSION;  Surgeon: Sanda Klein, MD;  Location: [place] [facility];  Service: Cardiovascular;  Laterality: N/A;   CARDIOVERSION N/A 03/04/2019   Procedure: CARDIOVERSION;  Surgeon: Lelon Perla,  MD;  Location: [place];  Service: Cardiovascular;  Laterality: N/A;   CARDIOVERSION N/A 07/04/2019   Procedure: CARDIOVERSION;  Surgeon: [name], MD;  Location: [place];  Service: Cardiovascular;  Laterality: N/A;   EYE SURGERY     lasik, [place] Right 08/26/2012   Procedure: LAPAROSCOPIC INGUINAL HERNIA;  Surgeon: Madilyn Hook, DO;  Location: [place];  Service: General;  Laterality: Right;  laparoscopic right inguinal hernia repair with mesh, umbilical hernia repair   INSERTION OF MESH Right 08/26/2012   Procedure: INSERTION OF MESH;  Surgeon:  Madilyn Hook, DO;  Location: [place];  Service: General;  Laterality: Right;  right inguinal hernia   TEE WITHOUT CARDIOVERSION N/A 03/04/2019   Procedure: TRANSESOPHAGEAL ECHOCARDIOGRAM (TEE);  Surgeon: Lelon Perla, MD;  Location: [place];  Service: Cardiovascular;  Laterality: N/A;   UMBILICAL HERNIA REPAIR N/A 08/26/2012   Procedure: HERNIA REPAIR UMBILICAL ADULT;  Surgeon: Madilyn Hook, DO;  Location: [place];  Service: General;  Laterality: N/A;   Social History:  reports that he has never smoked. He has never used smokeless tobacco. He reports that he does not drink alcohol and does not use drugs.  Allergies  Allergen Reactions   Other Other (See Comments)    "Hycomine" cough syrup = dizziness   Doxycycline Hyclate     Other Reaction(s): Flu-like symptoms   Gabapentin     Other Reaction(s): swelling in feet   Hydrocodone Other (See Comments)    GI upset, dizziness   Hydrocodone-Acetaminophen     Other Reaction(s): GI upset, Dizziness   Pregabalin     Other Reaction(s): swelling, severe dry mouth    Family History  Problem Relation Age of Onset   Asthma Mother    Breast cancer Mother    Hypertension Mother    COPD Father    Prostate cancer Father    Hypertension Father    Lymphoma Sister     Prior to Admission medications   Medication Sig Start Date End Date Taking? Authorizing Provider  apixaban (ELIQUIS) 5 MG TABS tablet TAKE 1 TABLET BY MOUTH TWICE A DAY 07/25/22   Lelon Perla, MD  aspirin EC 81 MG tablet Take 81 mg by mouth daily as needed (for chest pain). Swallow whole. Patient not taking: Reported on 05/19/2022    [provider]  carvedilol (COREG) 12.5 MG tablet Take 1 tablet (12.5 mg total) by mouth 2 (two) times daily with a meal. 04/28/22   Hosie Poisson, MD  diltiazem (CARDIZEM CD) 360 MG 24 hr capsule Take 1 capsule (360 mg total) by mouth daily. Please keep scheduled appointment for additional refills. Patient taking differently: Take  360 mg by mouth daily. 04/16/22   Lelon Perla, MD  DULoxetine (CYMBALTA) 60 MG capsule Take 60 mg by mouth 2 (two) times daily. 02/26/21   [provider]  finasteride (PROSCAR) 5 MG tablet Take 1 tablet (5 mg total) by mouth daily. 04/28/22   Hosie Poisson, MD  gabapentin (NEURONTIN) 100 MG capsule Take 1 capsule (100 mg total) by mouth 3 (three) times daily. Patient not taking: Reported on 05/19/2022 04/27/22   Hosie Poisson, MD  LORazepam (ATIVAN) 0.5 MG tablet Take 0.5 mg by mouth every 8 (eight) hours as needed for anxiety. Patient not taking: Reported on 05/19/2022    [provider]  montelukast (SINGULAIR) 10 MG tablet Take 10 mg by mouth at bedtime as needed (  for seasonal allergies). Patient not taking: Reported on 05/19/2022    [provider]  omeprazole (PRILOSEC) 20 MG capsule Take 20 mg by mouth daily as needed (for acid reflux).    [provider]  rOPINIRole (REQUIP) 1 MG tablet Take 1-2 tablets (1-2 mg total) by mouth at bedtime as needed (restless leg syndrome). 1 tablet after lunch and 2 tablet at bedtime Patient taking differently: Take 3 mg by mouth at bedtime. 08/07/21   Lelon Perla, MD  tadalafil (CIALIS) 5 MG tablet Take 5 mg by mouth daily as needed (as directed). Patient not taking: Reported on 05/19/2022    [provider]  tamsulosin (FLOMAX) 0.4 MG CAPS capsule Take 1 capsule (0.4 mg total) by mouth daily after supper. 04/27/22   Hosie Poisson, MD  tiZANidine (ZANAFLEX) 4 MG tablet Take 4-8 mg by mouth 3 (three) times daily as needed for muscle spasms. Patient not taking: Reported on 05/19/2022 04/22/22   [provider]    Physical Exam: Vitals:   08/13/22 2310 08/13/22 2315 08/14/22 0013 08/14/22 0441  BP:  (!) 116/98 (!) 150/88 (!) 139/90  Pulse:  (!) 121 (!) 102 (!) 109  Resp:  (!) '21 18 18  '$ Temp: (!) 97.4 F (36.3 C)  (!) 97.5 F (36.4 C) (!) 97.3 F (36.3 C)  TempSrc: Oral  Oral Oral  SpO2:   94% 99% 99%  Weight:   123.4 kg   Height:   6' 4.5" (1.943 m)    General:  Appeared agitated and uncomfortable at first, and then somnolent and sleeping comfortably Eyes:  EOMI, normal lids, iris ENT:  grossly normal hearing, lips & tongue, mmm Neck:  no LAD, masses or thyromegaly Cardiovascular:  Irregularly irregular with mild tachycardia. No LE edema.  Respiratory:   CTA bilaterally with no wheezes/rales/rhonchi.  Normal respiratory effort. Abdomen:  soft, NT, ND Skin:  stasis dermatitis of B lower legs with eczematous changes on the B upper legs Musculoskeletal:  grossly normal tone BUE/BLE, good ROM, no bony abnormality Psychiatric:  confused mood and affect, speech sparse and somewhat confused Neurologic:  CN 2-12 grossly intact, moves all extremities in coordinated fashion   Radiological Exams on Admission: Independently reviewed - see discussion in A/P where applicable  CT ABDOMEN PELVIS WO CONTRAST  Result Date: 08/13/2022 CLINICAL DATA:  Left lower quadrant abdominal pain. EXAM: CT ABDOMEN AND PELVIS WITHOUT CONTRAST TECHNIQUE: Multidetector CT imaging of the abdomen and pelvis was performed following the standard protocol without IV contrast. RADIATION DOSE REDUCTION: This exam was performed according to the departmental dose-optimization program which includes automated exposure control, adjustment of the mA and/or kV according to patient size and/or use of iterative reconstruction technique. COMPARISON:  CT abdomen pelvis dated 04/24/2022. FINDINGS: Lower chest: No acute abnormality. Hepatobiliary: No focal liver abnormality is seen. No gallstones, gallbladder wall thickening, or biliary dilatation. Pancreas: Unremarkable. No pancreatic ductal dilatation or surrounding inflammatory changes. Spleen: Normal in size without focal abnormality. Adrenals/Urinary Tract: Adrenal glands are unremarkable. A 9 cm cyst in the right kidney is redemonstrated. No imaging follow-up is recommended  for this finding. No renal calculi are seen on either side there is severe bilateral hydroureteronephrosis, unchanged from prior exam. The urinary bladder is distended and demonstrates bladder wall thickening with at least one diverticulum. Stomach/Bowel: Stomach is within normal limits. Appendix appears normal. No evidence of bowel wall thickening, distention, or inflammatory changes. Vascular/Lymphatic: Aortic atherosclerosis. No enlarged abdominal or pelvic lymph nodes. Reproductive: The prostate is  enlarged, measuring 5.2 cm in transverse dimension. This results in mass effect on the bladder. Other: No abdominal wall hernia or abnormality. No abdominopelvic ascites. Musculoskeletal: Degenerative changes are seen in the spine. IMPRESSION: 1. No acute findings in the abdomen or pelvis. 2. Severe bilateral hydroureteronephrosis, unchanged from prior exam. Distended urinary bladder with bladder wall thickening and at least one diverticulum. Findings may reflect chronic bladder outlet obstruction. Aortic Atherosclerosis (ICD10-I70.0). Electronically Signed   By: Zerita Boers M.D.   On: 08/13/2022 19:30   DG Ribs Unilateral W/Chest Left  Result Date: 08/13/2022 CLINICAL DATA:  Fatigue frequent fall EXAM: LEFT RIBS AND CHEST - 3+ VIEW COMPARISON:  04/24/2022, chest CT 11/17/2018 FINDINGS: Single view chest demonstrates atelectasis or scarring at the lingula. No pleural effusion or pneumothorax. Left rib series demonstrates no acute displaced left rib fracture. Old left fourth through sixth lateral rib fractures. IMPRESSION: No acute displaced left rib fracture. Old left fourth through sixth rib fractures. Negative for pneumothorax or pleural effusion. Atelectasis or scar at the lingula Electronically Signed   By: Donavan Foil M.D.   On: 08/13/2022 18:14    EKG: Independently reviewed.  Afib with rate 107; no evidence of acute ischemia   Labs on Admission: I have personally reviewed the available labs and  imaging studies at the time of the admission.  Pertinent labs:    Na++ 123 K+ 5.7 CO2 11 Glucose 123 BUN 218 -> 212/Creatinine 10.08 -> 8.98/GFR 5 -> 6; 62/3.5/19 on 04/28/22 Anion gap 18 Phos 9.2 Albumin 2.8 WBC 19.3 Hgb 11.8 TSH 1.144 UA: large Hgb, large LE, 30 protein, few bacteria Blood and urine cultures pending   Assessment and Plan: Principal Problem:   Acute kidney injury superimposed on chronic kidney disease ([place]) Active Problems:   Essential hypertension   Persistent atrial fibrillation (HCC)   Uremia    AKI on stage 4 CKD -Baseline creatinine is 3.5-4.5.   -Today's creatinine was 10 -> 9 -Likely due to obstructive uropathy; patient had foley placed and he is now showing improvement in renal function. -IVF provided -Follow up renal function by BMP -Avoid ACEI and NSAIDs  -With improving renal function, will hold on nephrology consult at this time -Foley is in place, needs outpatient urology f/u for voiding trial -Continue finasteride, tamsulosin  Uremia -Patient has had delirium that is likely related to uremia -For now, delirium precautions ordered with Haldol prn -Needs fall precautions -Continue duloxetine  Afib -Rate controlled with diltiazem, carvedilol -Hold Eliquis for now, given increased bleeding risk at this time  HTN -Continue carvedilol, diltiazem     Advance Care Planning:   Code Status: Full Code - unable to discuss with patient but he has a MOST form indicating desire for full code  Consults: None  DVT Prophylaxis: Heparin SQ  Family Communication: None present currently but wife was present with him in the ER  Severity of Illness: The appropriate patient status for this patient is INPATIENT. Inpatient status is judged to be reasonable and necessary in order to provide the required intensity of service to ensure the patient's safety. The patient's presenting symptoms, physical exam findings, and initial radiographic and  laboratory data in the context of their chronic comorbidities is felt to place them at high risk for further clinical deterioration. Furthermore, it is not anticipated that the patient will be medically stable for discharge from the hospital within 2 midnights of admission.   * I certify that at the point of admission it is my  clinical judgment that the patient will require inpatient hospital care spanning beyond 2 midnights from the point of admission due to high intensity of service, high risk for further deterioration and high frequency of surveillance required.*  Author: Karmen Bongo, MD 08/14/2022 5:58 AM  For on call review [email or web address].

## 2022-08-14 NOTE — Progress Notes (Signed)
  Progress Note   Patient: [name] [id number]:[id number] DOB: 1956-04-30 DOA: 08/13/2022     0 DOS: the patient was seen and examined on 08/14/2022   Brief hospital course: 67 y.o. male with medical history significant of stage 4 CKD, afib on Eliquis, chronic diastolic CHF,  HTN, HLD, and BPH presenting with fatigue, n/v. Pt also complained of on-going leg cramps.   Pt found to have a presenting Cr of 10.08. Reportedly recently treated with bactrim. Pt found to have obstructive uropathy with foley cath being placed at time of presenattion  Assessment and Plan: AKI on stage 4 CKD -Baseline creatinine is 3.5-4.5.   -Presented with Cr over 10, improved to 9 after foley placed at time of presentation -Cont to avoid ACEI and NSAIDs -Nephrology consulted. Currently on bicarb gtt per Nephrology -If no significant improvement by tomorrow with foley cath, may need to proceed to dialysis -recheck bmet in AM   Uremia -Patient has had delirium that is likely related to uremia -When seen this AM, more oriented -Continue duloxetine   Afib -Rate controlled with diltiazem, carvedilol -Cont on eliquis   HTN -Continue carvedilol, diltiazem  UTI with sepsis present on admit -Presented with WBC 20.4 with UA suggestive of UTI, toxic metabolic encephalopathy -UA suggestive of UTI -Urine cx pos for gm neg rods -Will start empiric rocephin  Toxic metabolic encephalopathy -secondary to UTI  Hyponatremia  Hyperkalemia -given lokelma  Metabolic acidosis -secondary to renal failure      Subjective: Complaining of muscle cramping  Physical Exam: Vitals:   08/14/22 0013 08/14/22 0441 08/14/22 0937 08/14/22 1622  BP: (!) 150/88 (!) 139/90 (!) 145/88 (!) 153/86  Pulse: (!) 102 (!) 109 100 100  Resp: '18 18 20 18  '$ Temp: (!) 97.5 F (36.4 C) (!) 97.3 F (36.3 C) 98.2 F (36.8 C) 98 F (36.7 C)  TempSrc: Oral Oral Oral   SpO2: 99% 99% 98% 99%  Weight: 123.4 kg     Height: 6' 4.5" (1.943  m)      General exam: Awake, laying in bed, in nad Respiratory system: Normal respiratory effort, no wheezing Cardiovascular system: regular rate, s1, s2 Gastrointestinal system: Soft, nondistended, positive BS Central nervous system: CN2-12 grossly intact, strength intact Extremities: Perfused, no clubbing Skin: Normal skin turgor, no notable skin lesions seen Psychiatry: Mood normal // no visual hallucinations   Data Reviewed:  Labs reviewed: na 123, K 5.7, Cr 8.98, BUN 212, WBC 19.3, Hgb 11.8  Family Communication: Pt in room, family at bedside  Disposition: Status is: Inpatient Remains inpatient appropriate because: Severity of illness  Planned Discharge Destination:  Unclear at this time    Author: Marylu Lund, MD 08/14/2022 5:58 PM  For on call review [email or web address].

## 2022-08-14 NOTE — Hospital Course (Signed)
67 y.o. male with medical history significant of stage 4 CKD, afib on Eliquis, chronic diastolic CHF,  HTN, HLD, and BPH presenting with fatigue, n/v. Pt also complained of on-going leg cramps.   Pt found to have a presenting Cr of 10.08. Reportedly recently treated with bactrim. Pt found to have obstructive uropathy with foley cath being placed at time of presenattion

## 2022-08-14 NOTE — Progress Notes (Signed)
Carryover admission to the Day Admitter; accepted by Dr.  Claria Dice as transfer from  [facility]  to a  med-tele bed at  [facility]  for  aki superimposed on CKD 4 in the setting of suspected obstructive uropathy, s/p interval foley cath placement with good ensuing urine output. Initial labs also notable for mild hyperkalemia. Please see Dr.  Torrie Mayers transfer progress note as well as my progress note for additional details.   I have placed some additional preliminary admit orders via the adult multi-morbid admission order set. I have also ordered LR 75 cc/hr given suspected element of obstructive uropathy, without e/o of obstructive stone on CT A/P. Of note repeat bmp was previously ordered for 0100 and existing order for BID Lokelma is noted. In setting of mild hyperkalemia , I've also held home Coreg for now.   Patient with h/o permament a fib, also noted to have initial HR's in the 120's - 140's. I've resumed home PO diltiazem and added prn iv lopressor for sustained HR's greater than 130 bpm.   I've also ordered AM labs in form of CMP, CBC, Mg, and Phos levels.   RN notified me that patient exhibiting some e/o agitation: I've placed order for prn iv haldol x 1 one dose of agitation.    Babs Bertin, DO Hospitalist

## 2022-08-15 ENCOUNTER — Inpatient Hospital Stay (HOSPITAL_COMMUNITY): Payer: Medicare Other

## 2022-08-15 DIAGNOSIS — N179 Acute kidney failure, unspecified: Secondary | ICD-10-CM | POA: Diagnosis not present

## 2022-08-15 DIAGNOSIS — N189 Chronic kidney disease, unspecified: Secondary | ICD-10-CM | POA: Diagnosis not present

## 2022-08-15 DIAGNOSIS — N320 Bladder-neck obstruction: Secondary | ICD-10-CM | POA: Diagnosis not present

## 2022-08-15 DIAGNOSIS — E860 Dehydration: Secondary | ICD-10-CM | POA: Diagnosis not present

## 2022-08-15 HISTORY — PX: IR US GUIDE VASC ACCESS RIGHT: IMG2390

## 2022-08-15 HISTORY — PX: IR FLUORO GUIDE CV LINE RIGHT: IMG2283

## 2022-08-15 LAB — CBC
HCT: 31.9 % — ABNORMAL LOW (ref 39.0–52.0)
Hemoglobin: 11 g/dL — ABNORMAL LOW (ref 13.0–17.0)
MCH: 27.5 pg (ref 26.0–34.0)
MCHC: 34.5 g/dL (ref 30.0–36.0)
MCV: 79.8 fL — ABNORMAL LOW (ref 80.0–100.0)
Platelets: 272 10*3/uL (ref 150–400)
RBC: 4 MIL/uL — ABNORMAL LOW (ref 4.22–5.81)
RDW: 16.4 % — ABNORMAL HIGH (ref 11.5–15.5)
WBC: 16.6 10*3/uL — ABNORMAL HIGH (ref 4.0–10.5)
nRBC: 0 % (ref 0.0–0.2)

## 2022-08-15 LAB — URINE CULTURE: Culture: >=100000 — AB

## 2022-08-15 LAB — BASIC METABOLIC PANEL
Anion gap: 19 — ABNORMAL HIGH (ref 5–15)
BUN: 181 mg/dL — ABNORMAL HIGH (ref 8–23)
CO2: 14 mmol/L — ABNORMAL LOW (ref 22–32)
Calcium: 6.5 mg/dL — ABNORMAL LOW (ref 8.9–10.3)
Chloride: 98 mmol/L (ref 98–111)
Creatinine, Ser: 6.79 mg/dL — ABNORMAL HIGH (ref 0.61–1.24)
GFR, Estimated: 8 mL/min — ABNORMAL LOW (ref >60)
Glucose, Bld: 124 mg/dL — ABNORMAL HIGH (ref 70–99)
Potassium: 4.1 mmol/L (ref 3.5–5.1)
Sodium: 131 mmol/L — ABNORMAL LOW (ref 135–145)

## 2022-08-15 LAB — HEPATITIS B SURFACE ANTIGEN: Hepatitis B Surface Ag: NONREACTIVE

## 2022-08-15 MED ORDER — HEPARIN SODIUM (PORCINE) 1000 UNIT/ML DIALYSIS
1000.0000 [IU] | INTRAMUSCULAR | Status: DC | PRN
  Filled 2022-08-15: qty 1

## 2022-08-15 MED ORDER — LIDOCAINE HCL 1 % IJ SOLN
INTRAMUSCULAR | Status: AC
  Administered 2022-08-15: 10 mL
  Filled 2022-08-15: qty 20

## 2022-08-15 MED ORDER — HYDROMORPHONE HCL 1 MG/ML IJ SOLN
0.5000 mg | Freq: Once | INTRAMUSCULAR | Status: AC
  Administered 2022-08-15: 0.5 mg via INTRAVENOUS
  Filled 2022-08-15: qty 1

## 2022-08-15 MED ORDER — ROPINIROLE HCL 1 MG PO TABS
2.0000 mg | ORAL_TABLET | Freq: Every day | ORAL | Status: DC
  Administered 2022-08-15 – 2022-08-17 (×3): 2 mg via ORAL
  Filled 2022-08-15 (×3): qty 2

## 2022-08-15 MED ORDER — CHLORHEXIDINE GLUCONATE CLOTH 2 % EX PADS
6.0000 | MEDICATED_PAD | Freq: Every day | CUTANEOUS | Status: DC
  Administered 2022-08-15: 6 via TOPICAL

## 2022-08-15 MED ORDER — ALTEPLASE 2 MG IJ SOLR: 2.0000 mg | Freq: Once | INTRAMUSCULAR | Status: DC | PRN

## 2022-08-15 MED ORDER — HEPARIN SODIUM (PORCINE) 1000 UNIT/ML IJ SOLN
INTRAMUSCULAR | Status: AC
  Filled 2022-08-15: qty 10

## 2022-08-15 MED ORDER — ANTICOAGULANT SODIUM CITRATE 4% (200MG/5ML) IV SOLN: 5.0000 mL | Status: DC | PRN

## 2022-08-15 MED ORDER — LABETALOL HCL 5 MG/ML IV SOLN
5.0000 mg | INTRAVENOUS | Status: DC | PRN
  Administered 2022-08-15 (×2): 5 mg via INTRAVENOUS
  Filled 2022-08-15 (×2): qty 4

## 2022-08-15 MED ORDER — SODIUM CHLORIDE 0.9 % IV SOLN
1.0000 g | INTRAVENOUS | Status: DC
  Administered 2022-08-15 – 2022-08-17 (×3): 1 g via INTRAVENOUS
  Filled 2022-08-15 (×4): qty 10

## 2022-08-15 NOTE — Progress Notes (Signed)
  Progress Note   Patient: [name] [id number] DOB: 1955-10-06 DOA: 08/13/2022     1 DOS: the patient was seen and examined on 08/15/2022   Brief hospital course: 67 y.o. male with medical history significant of stage 4 CKD, afib on Eliquis, chronic diastolic CHF,  HTN, HLD, and BPH presenting with fatigue, n/v. Pt also complained of on-going leg cramps.   Pt found to have a presenting Cr of 10.08. Reportedly recently treated with bactrim. Pt found to have obstructive uropathy with foley cath being placed at time of presenattion  Assessment and Plan: AKI on stage 4 CKD -Baseline creatinine is 3.5-4.5.   -Presented with Cr over 10, improved to 9 after foley placed at time of presentation -Cont to avoid ACEI and NSAIDs -Nephrology consulted. Had been on bicarb gtt. Cr did trend down somewhat this AM -Per Nephro, still recommendation for initiating HD   Uremia -Patient has had delirium that is likely related to uremia -When seen this AM, more oriented -Continue duloxetine   Afib -Rate controlled with diltiazem, carvedilol -Cont on eliquis   HTN -Continue carvedilol, diltiazem  UTI with sepsis present on admit -Presented with WBC 20.4 with UA suggestive of UTI, toxic metabolic encephalopathy -UA suggestive of UTI -Urine cx pos for pseudomonas, abx transitioned to cefepime  Toxic metabolic encephalopathy -secondary to UTI -seems improved  Hyponatremia -improved -Cont follow bmet trends  Hyperkalemia -resolved -check bmet in AM  Metabolic acidosis -secondary to renal failure -somewhat improved      Subjective: Without complaints this AM  Physical Exam: Vitals:   08/15/22 0553 08/15/22 0929 08/15/22 1649 08/15/22 1732  BP: (!) 143/85 (!) 129/90 (!) 146/99 (!) 147/95  Pulse: 88 (!) 106 70 (!) 101  Resp: '18 19 18 18  '$ Temp: 98 F (36.7 C) 98.4 F (36.9 C) (!) 97.4 F (36.3 C) 98.1 F (36.7 C)  TempSrc: Oral  Oral   SpO2: 99% 100% 100%   Weight:       Height:       General exam: Conversant, in no acute distress Respiratory system: normal chest rise, clear, no audible wheezing Cardiovascular system: regular rhythm, s1-s2 Gastrointestinal system: Nondistended, nontender, pos BS Central nervous system: No seizures, no tremors Extremities: No cyanosis, no joint deformities Skin: No rashes, no pallor Psychiatry: Affect normal // no auditory hallucinations   Data Reviewed:  Labs reviewed: Na 131, K 4.1, Cr 6.79, WBC 16.6, Hgb 11.0  Family Communication: Pt in room, family at bedside  Disposition: Status is: Inpatient Remains inpatient appropriate because: Severity of illness  Planned Discharge Destination:  Unclear at this time    Author: Marylu Lund, MD 08/15/2022 5:36 PM  For on call review [email or web address].

## 2022-08-15 NOTE — Progress Notes (Signed)
  Received patient in bed to unit.  Alert and oriented.  Informed consent signed and in the chart.  Patient tolerated well.  Transported back to the room  Alert, without acute distress.  Hand-off given to patient's nurse.    08/15/22 2121  Post Treatment  Dialyzer Clearance Lightly streaked  Duration of HD Treatment -hour(s) 2 hour(s)  Hemodialysis Intake (mL) 0 mL  Liters Processed 24  Fluid Removed (mL) 0 mL  Tolerated HD Treatment Yes  Post-Hemodialysis Comments HD tx achieved as expected, tolerated well, BP has been elevated, no acute distress  AVG/AVF Arterial Site Held (minutes) 0 minutes  AVG/AVF Venous Site Held (minutes) 0 minutes  Note  Observations pt is A/OX, in bed , and stable.  Hemodialysis Catheter Right Internal jugular Triple lumen Temporary (Non-Tunneled)  Placement Date/Time: 08/15/22 1608   Serial / Lot #: [id number]  Expiration Date: 05/06/27  Time Out: Correct patient;Correct site;Correct procedure  Maximum sterile barrier precautions: Hand hygiene;Cap;Mask;Sterile gown;Sterile gloves;Large sterile ...  Site Condition No complications  Blue Lumen Status Flushed;Heparin locked;Dead end cap in place  Red Lumen Status Flushed;Heparin locked;Dead end cap in place  Purple Lumen Status N/A  Catheter fill solution Heparin 1000 units/ml  Catheter fill volume (Arterial) 1.3 cc  Catheter fill volume (Venous) 1.3  Dressing Type Gauze/Drain sponge  Dressing Status Clean, Dry, Intact  Interventions New dressing  Drainage Description None  Dressing Change Due 08/16/22  Post treatment catheter status Capped and Clamped

## 2022-08-15 NOTE — Plan of Care (Signed)

## 2022-08-15 NOTE — Consult Note (Signed)
[facility] Nurse Consult Note:  This patient is followed at [place] at [facility] for bilateral lower leg wounds.  Has been in compression but per wife and note from [place] patient unable to tolerate compression and cuts compression wraps off.   Reason for Consult: left lower leg ulcers  Wound type: Likely vascular in origin, patient with documented diagnosis of venous insufficiency  Pressure Injury POA: NA, these are not pressure injuries  Measurement: 1.  R medial leg healed 2 cms x 3 cms dry scabbed area  2. L lower leg with 3 open areas:  most distal 2 cms x 1.3 cms x 0.2 cms 100% pink and moist, proximal to this area 2 cms x 0.5 cms x 0.1 cms area of dry dark wound bed, most proximal  3 cms x 1 cms x 0.1 cm dark red wound bed  Drainage (amount, consistency, odor) minimal serosanguinous noted on dressing left leg wounds; R leg dry  Periwound: patient with edema, darkened skin which appears consistent with venous dermatitis  Dressing procedure/placement/frequency: Cleanse both legs with soap and water, dry thoroughly.  Apply Xeroform gauze Kellie Simmering [phone]) to dry wound on R medial leg and open areas to left lower leg daily, cover with ABD pad, Kerlix and Ace wrap.   POC discussed with bedside nurse and wife.  WOC will not follow patient at this time, re-consult if further needs arise.   Thank you,    Charleigh Correnti MSN, RN-BC, [employer]

## 2022-08-15 NOTE — Progress Notes (Signed)
HR 120's to 130's uncont. A-Fib, Labetolol 5 mg IV given as per prn orders  [name]

## 2022-08-15 NOTE — Progress Notes (Signed)
Bil. Legs washed with soap and water per orders. Pt. And wife refusing petroleum, kerlix and ace wrap treatment, states they did this type of treatment before and it did not work, requesting to have foam dressing put on, foam dressing applied per pt. Request, wound nurse and MD aware  [name]

## 2022-08-15 NOTE — Procedures (Signed)
Interventional Radiology Procedure Note  Procedure: Placement of a 16 cm temp HD catheter via right IJ.  Tip in the SVC and ready for use.   Complications: None  Estimated Blood Loss: None  Recommendations: - Routine line care   Signed,  Criselda Peaches, MD

## 2022-08-15 NOTE — Progress Notes (Signed)
Pharmacy Antibiotic Note  [name] is a 67 y.o. male admitted on 08/13/2022 with Pseudomonas aeruginosa UTI.  Pharmacy has been consulted for Cefepime dosing.  Plan: Discontinue Ceftriaxone Start Cefepime 1g IV q24h (give after HD on dialysis days) Monitor daily CBC, temp, SCr, and for clinical signs of improvement  F/u nephrology plans for HD, if needed  Height: 6' 4.5" (194.3 cm) Weight: 121.4 kg (267 lb 10.2 oz) IBW/kg (Calculated) : 87.95  Temp (24hrs), Avg:97.9 F (36.6 C), Min:97.3 F (36.3 C), Max:98.4 F (36.9 C)  Recent Labs  Lab 08/13/22 1753 08/13/22 1930 08/14/22 0335 08/15/22 0106  WBC 20.4*  --  19.3* 16.6*  CREATININE 10.08*  --  8.98* 6.79*  LATICACIDVEN 1.0 0.8  --   --     Estimated Creatinine Clearance: 15.6 mL/min (A) (by C-G formula based on SCr of 6.79 mg/dL (H)).    Allergies  Allergen Reactions   Baclofen Other (See Comments)    Severe dizziness   Doxycycline Hyclate Other (See Comments)    Flu-like symptoms   Gabapentin Swelling and Other (See Comments)    Swelling in feet   Hycodan [Hydrocodone Bit-Homatrop Mbr] Other (See Comments)    Dizziness    Hydrocodone-Acetaminophen Other (See Comments)    GI upset, Dizziness   Pregabalin Swelling and Other (See Comments)    Severly dry mouth    Antimicrobials this admission: Ceftriaxone 2/8 x1 Cefepime 2/9 >>   Dose adjustments this admission: N/A  Microbiology results: 2/7 BCx: NGTD x2 days 2/7 UCx: Pseudomonas aeruginosa (pan-sensitive)   Thank you for allowing pharmacy to be a part of this patient's care.  Luisa Hart, PharmD, BCPS Clinical Pharmacist 08/15/2022 4:22 PM   Please refer to [facility]ION for pharmacy phone number

## 2022-08-15 NOTE — Consult Note (Addendum)
Reason for Consult: AKI on CKD Referring Physician: Marylu Lund MD  Chief Complaint: Fatigue, nausea vomiting  Assessment/Plan: AKI on CKD 4: Creatinine improving 8.98 > 6.79 (exact baseline is unknown although was 2.84 in PCP office on 10/26).  Improvement suggests AKI primarily caused by obstruction, has continued to have excellent UOP with catheter in place. Continue Proscar and Flomax. Additional recommendations.  - Dose all meds for creatinine clearance < 10 ml/min  - Unless absolutely necessary, no MRIs with gadolinium.  Avoid nephrotoxic medications including NSAIDs and iodinated intravenous contrast exposure unless the latter is absolutely indicated.  Preferred narcotic agents for pain control are hydromorphone, fentanyl, and methadone. Morphine should not be used. Avoid Baclofen and avoid oral sodium phosphate and magnesium citrate based laxatives / bowel preps. Continue strict Input and Output monitoring. Will monitor the patient closely with you and intervene or adjust therapy as indicated by changes in clinical status/labs. Hyperkalemia: Improved to 4.1.  Advise discontinuing Lokelma. Hyponatremia: Significant improvement from 123 > 131.  Suspect related to hypovolemia. Uremia: BUN improving slightly, 212 > 181. He would benefit from dialysis, although unable to anticipate if this will be needed long term. IR consult for catheter placement and initiation of short course dialysis today. Careful to avoid overcorrection of BUN. Additional dialysis session tomorrow. Will continue to assess need for long-term dialysis.  Nutrition/Electrolytes: See hyponatremia, hyperkalemia above.  Hypocalcemia, hyperphosphatemia, hypoalbuminemia suggestive of secondary hyperparathyroidism 2/2 renal failure. Calcium worsened 2/2 addition of bicarb gtt. Vitamin D pending. Anion gap metabolic acidosis: suspect related to AKI. AG unchanged, although electrolytes with significant improvement. Remain on bicarb  gtt. A-fib: Per primary service HTN: Per primary service UTI: gm neg Ucx. Rocephin initiated by primary service, agree with plan. Anemia: Unspecified cause at this time. Suspect component of anemia of chronic disease. Rash: No component that would be suggestive of vasculitis. Appears to be c/w contact dermatitis. Elevated CK may be concerning for dermatomyositis however would expect rash elsewhere for this. CK elevation likely related to muscle breakdown with history of recent falls and not greatly elevated to suggest any component of rhabdo. Would treat as contact dermatitis for now.    HPI: [name] is an 67 y.o. male w/ pmhx of CKD4, A-fib on Eliquis, HTN, HLD, chronic diastolic CHF (LV EF 60 to 65%), BPH, venous stasis wounds of lower extremities, OSA noncompliant on CPAP.  Patient was admitted from [facility] and then transferred to [facility] for AKI superimposed on CKD 4. Entire history provided by patient's spouse: In the last several days, patient has been experiencing multiple episodes of diarrhea, refusing to eat in addition to multiple falls.  She notes that he was seen by PCP in the last couple days and was advised to proceed to the ED because his lab workup was terrible.   She notes that at home he is mostly in pain and while he does take his medications, he is poorly compliant with medical care.  He has history of taking 15-20 tabs of ibuprofen daily for the last several years relating to his lower extremity pain.  He was told not to take this anymore but she is unsure if he has been compliant with this.  He has diuresed significantly while taking torsemide.  She provided all medications that he has availability to which did not include finasteride nor tamsulosin.  At baseline, he is able to converse but gets fatigued easily with taking his muscle relaxers.  He has not been drinking as well at  home.  States that for the last several days his urine has been limited, frothy, caramel colored and he  has been complaining of being dehydrated.  Today, he denies any nausea but does feel poor. He is able to converse more. His wife is present with him and states he will not do dialysis long term. He is amenable to dialysis today. He endorses anterior thigh pain and rash bilaterally which has been present x3-4 days. Denies itching. His wife has been using a lysol wash to clean him, denies fragrance in that.   Chemistry and CBC: Creat  Date/Time Value Ref Range Status  12/11/2021 02:47 PM 2.05 (H) 0.70 - 1.35 mg/dL Final  11/15/2015 08:47 AM 1.10 0.70 - 1.33 mg/dL Final   Creatinine, Ser  Date/Time Value Ref Range Status  08/15/2022 01:06 AM 6.79 (H) 0.61 - 1.24 mg/dL Final  08/14/2022 03:35 AM 8.98 (H) 0.61 - 1.24 mg/dL Final  08/13/2022 05:53 PM 10.08 (H) 0.61 - 1.24 mg/dL Final  04/28/2022 04:07 AM 3.50 (H) 0.61 - 1.24 mg/dL Final  04/27/2022 04:34 AM 3.61 (H) 0.61 - 1.24 mg/dL Final  04/26/2022 11:25 AM 3.90 (H) 0.61 - 1.24 mg/dL Final  04/25/2022 11:07 AM 4.52 (H) 0.61 - 1.24 mg/dL Final  04/25/2022 05:56 AM 4.68 (H) 0.61 - 1.24 mg/dL Final  04/24/2022 05:07 PM 5.50 (H) 0.61 - 1.24 mg/dL Final  04/24/2022 04:46 PM 4.98 (H) 0.61 - 1.24 mg/dL Final  04/21/2021 03:40 AM 1.40 (H) 0.61 - 1.24 mg/dL Final  04/20/2021 03:49 AM 1.32 (H) 0.61 - 1.24 mg/dL Final  04/19/2021 03:52 AM 1.29 (H) 0.61 - 1.24 mg/dL Final  04/18/2021 01:13 AM 1.19 0.61 - 1.24 mg/dL Final  04/17/2021 01:33 AM 1.24 0.61 - 1.24 mg/dL Final  04/16/2021 04:07 PM 0.94 0.61 - 1.24 mg/dL Final  04/27/2020 04:01 AM 0.97 0.61 - 1.24 mg/dL Final  04/26/2020 04:09 AM 0.92 0.61 - 1.24 mg/dL Final  04/25/2020 04:32 AM 0.91 0.61 - 1.24 mg/dL Final  04/24/2020 07:08 PM 0.87 0.61 - 1.24 mg/dL Final  06/27/2019 04:02 PM 1.60 (H) 0.76 - 1.27 mg/dL Final  05/02/2019 11:37 AM 1.13 0.61 - 1.24 mg/dL Final  03/03/2019 03:51 PM 1.17 0.76 - 1.27 mg/dL Final  11/17/2018 01:24 PM 0.78 0.61 - 1.24 mg/dL Final  03/31/2017 02:01 PM 1.06 0.76  - 1.27 mg/dL Final  03/23/2017 04:55 PM 1.13 0.76 - 1.27 mg/dL Final  01/13/2013 09:26 AM 1.00 0.50 - 1.35 mg/dL Final  01/13/2013 09:20 AM 1.06 0.50 - 1.35 mg/dL Final  08/18/2012 01:42 PM 1.00 0.50 - 1.35 mg/dL Final  08/11/2011 08:22 AM 0.84 0.50 - 1.35 mg/dL Final  06/16/2011 01:07 PM 1.1 0.4 - 1.5 mg/dL Final  06/07/2009 11:40 AM 1.0 0.4 - 1.5 mg/dL Final  02/17/2009 05:39 PM 0.9 0.4 - 1.5 mg/dL Final  02/14/2009 06:30 AM 1.08 0.4 - 1.5 mg/dL Final  02/13/2009 05:26 AM 1.22 0.4 - 1.5 mg/dL Final   Recent Labs  Lab 08/13/22 1753 08/13/22 1803 08/14/22 0335 08/15/22 0106  NA 123* 125* 123* 131*  K 5.9* 5.8* 5.7* 4.1  CL 91*  --  94* 98  CO2 13*  --  11* 14*  GLUCOSE 101*  --  123* 124*  BUN 218*  --  212* 181*  CREATININE 10.08*  --  8.98* 6.79*  CALCIUM 7.7*  --  7.0* 6.5*  PHOS  --   --  9.2*  --    Recent Labs  Lab 08/13/22 1753 08/13/22 1803 08/14/22 0335  08/15/22 0106  WBC 20.4*  --  19.3* 16.6*  NEUTROABS 18.4*  --  17.5*  --   HGB 12.0* 13.3 11.8* 11.0*  HCT 35.9* 39.0 36.6* 31.9*  MCV 80.1  --  82.8 79.8*  PLT 337  --  311 272   Liver Function Tests: Recent Labs  Lab 08/13/22 1753 08/14/22 0335  AST 39 38  ALT 59* 55*  ALKPHOS 96 90  BILITOT 0.5 0.7  PROT 9.6* 8.5*  ALBUMIN 3.6 2.8*   No results for input(s): "LIPASE", "AMYLASE" in the last 168 hours. Recent Labs  Lab 08/13/22 1815  AMMONIA 22   Cardiac Enzymes: Recent Labs  Lab 08/14/22 0335  CKTOTAL 746*   Iron Studies: No results for input(s): "IRON", "TIBC", "TRANSFERRIN", "FERRITIN" in the last 72 hours. PT/INR: @LABRCNTIP$ (inr:5)  Xrays/Other Studies: ) Results for orders placed or performed during the hospital encounter of 08/13/22 (from the past 48 hour(s))  CBC with Differential/Platelet     Status: Abnormal   Collection Time: 08/13/22  5:53 PM  Result Value Ref Range   WBC 20.4 (H) 4.0 - 10.5 K/uL   RBC 4.48 4.22 - 5.81 MIL/uL   Hemoglobin 12.0 (L) 13.0 - 17.0 g/dL    HCT 35.9 (L) 39.0 - 52.0 %   MCV 80.1 80.0 - 100.0 fL   MCH 26.8 26.0 - 34.0 pg   MCHC 33.4 30.0 - 36.0 g/dL   RDW 16.5 (H) 11.5 - 15.5 %   Platelets 337 150 - 400 K/uL   nRBC 0.0 0.0 - 0.2 %   Neutrophils Relative % 90 %   Neutro Abs 18.4 (H) 1.7 - 7.7 K/uL   Lymphocytes Relative 3 %   Lymphs Abs 0.6 (L) 0.7 - 4.0 K/uL   Monocytes Relative 6 %   Monocytes Absolute 1.2 (H) 0.1 - 1.0 K/uL   Eosinophils Relative 0 %   Eosinophils Absolute 0.1 0.0 - 0.5 K/uL   Basophils Relative 0 %   Basophils Absolute 0.0 0.0 - 0.1 K/uL   Immature Granulocytes 1 %   Abs Immature Granulocytes 0.18 (H) 0.00 - 0.07 K/uL    Comment: Performed at [facility], [address] [place], [place] [address]  Brain natriuretic peptide     Status: None   Collection Time: 08/13/22  5:53 PM  Result Value Ref Range   B Natriuretic Peptide 74.5 0.0 - 100.0 pg/mL    Comment: Performed at [facility], [address], [place], [place] [address]  Comprehensive metabolic panel     Status: Abnormal   Collection Time: 08/13/22  5:53 PM  Result Value Ref Range   Sodium 123 (L) 135 - 145 mmol/L   Potassium 5.9 (H) 3.5 - 5.1 mmol/L    Comment: NO VISIBLE HEMOLYSIS   Chloride 91 (L) 98 - 111 mmol/L   CO2 13 (L) 22 - 32 mmol/L   Glucose, Bld 101 (H) 70 - 99 mg/dL    Comment: Glucose reference range applies only to samples taken after fasting for at least 8 hours.   BUN 218 (H) 8 - 23 mg/dL   Creatinine, Ser 10.08 (H) 0.61 - 1.24 mg/dL   Calcium 7.7 (L) 8.9 - 10.3 mg/dL   Total Protein 9.6 (H) 6.5 - 8.1 g/dL   Albumin 3.6 3.5 - 5.0 g/dL   AST 39 15 - 41 U/L   ALT 59 (H) 0 - 44 U/L   Alkaline Phosphatase 96 38 - 126 U/L   Total Bilirubin  0.5 0.3 - 1.2 mg/dL   GFR, Estimated 5 (L) >60 mL/min    Comment: (NOTE) Calculated using the CKD-EPI Creatinine Equation (2021)    Anion gap 19 (H) 5 - 15    Comment: Performed at [facility], [address]  [place], [place] [address]  Lactic acid, plasma     Status: None   Collection Time: 08/13/22  5:53 PM  Result Value Ref Range   Lactic Acid, Venous 1.0 0.5 - 1.9 mmol/L    Comment: Performed at [facility], [place], [place] [address]  Troponin I (High Sensitivity)     Status: None   Collection Time: 08/13/22  5:53 PM  Result Value Ref Range   Troponin I (High Sensitivity) 15 <18 ng/L    Comment: (NOTE) Elevated high sensitivity troponin I (hsTnI) values and significant  changes across serial measurements may suggest ACS but many other  chronic and acute conditions are known to elevate hsTnI results.  Refer to the "Links" section for chest pain algorithms and additional  guidance. Performed at [facility], [address], [place], [place] [address]   I-Stat venous blood gas, ED     Status: Abnormal   Collection Time: 08/13/22  6:03 PM  Result Value Ref Range   pH, Ven 7.210 (L) 7.25 - 7.43   pCO2, Ven 36.3 (L) 44 - 60 mmHg   pO2, Ven 19 (LL) 32 - 45 mmHg   Bicarbonate 14.6 (L) 20.0 - 28.0 mmol/L   TCO2 16 (L) 22 - 32 mmol/L   O2 Saturation 22 %   Acid-base deficit 13.0 (H) 0.0 - 2.0 mmol/L   Sodium 125 (L) 135 - 145 mmol/L   Potassium 5.8 (H) 3.5 - 5.1 mmol/L   Calcium, Ion 0.93 (L) 1.15 - 1.40 mmol/L   HCT 39.0 39.0 - 52.0 %   Hemoglobin 13.3 13.0 - 17.0 g/dL   Patient temperature 36.7 C    Collection site IV start    Drawn by Nurse    Sample type VENOUS    Comment NOTIFIED PHYSICIAN   Ammonia     Status: None   Collection Time: 08/13/22  6:15 PM  Result Value Ref Range   Ammonia 22 9 - 35 umol/L    Comment: Performed at [facility], [address] [place], [place] [address]  Lactic acid, plasma     Status: None   Collection Time: 08/13/22  7:30 PM  Result Value Ref Range   Lactic Acid, Venous 0.8 0.5 - 1.9 mmol/L    Comment: Performed at [facility], [place], [place] [address]  Troponin I (High Sensitivity)     Status: None   Collection Time: 08/13/22  7:30 PM  Result Value Ref Range   Troponin I (High Sensitivity) 14 <18 ng/L    Comment: (NOTE) Elevated high sensitivity troponin I (hsTnI) values and significant  changes across serial measurements may suggest ACS but many other  chronic and acute conditions are known to elevate hsTnI results.  Refer to the "Links" section for chest pain algorithms and additional  guidance. Performed at [facility], [address] [place], [place] [address]   Urinalysis, Routine w reflex microscopic -Urine, Clean Catch     Status: Abnormal   Collection Time: 08/13/22  7:45 PM  Result Value Ref Range   Color, Urine YELLOW YELLOW   APPearance CLOUDY (A) CLEAR   Specific Gravity, Urine 1.012 1.005 -  1.030   pH 5.5 5.0 - 8.0   Glucose, UA NEGATIVE NEGATIVE mg/dL   Hgb urine dipstick LARGE (A) NEGATIVE   Bilirubin Urine NEGATIVE NEGATIVE   Ketones, ur NEGATIVE NEGATIVE mg/dL   Protein, ur 30 (A) NEGATIVE mg/dL   Nitrite NEGATIVE NEGATIVE   Leukocytes,Ua LARGE (A) NEGATIVE   RBC / HPF 0-5 0 - 5 RBC/hpf   WBC, UA >50 0 - 5 WBC/hpf   Bacteria, UA FEW (A) NONE SEEN   Squamous Epithelial / HPF 0-5 0 - 5 /HPF    Comment: Performed at [facility], [address] [place], [place] [address]  Culture, Urine (Do not remove urinary catheter, catheter placed by urology or difficult to place)     Status: Abnormal (Preliminary result)   Collection Time: 08/13/22  7:45 PM   Specimen: Urine, Catheterized  Result Value Ref Range   Specimen Description      URINE, CATHETERIZED Performed at [facility], [address], [place], [place] [address]    Special Requests      NONE Performed at [facility], [place], [place] [address]    Culture (A)     >=100,000 COLONIES/mL GRAM NEGATIVE RODS IDENTIFICATION AND  SUSCEPTIBILITIES TO FOLLOW Performed at [facility], [address] [place], [place] [address]    Report Status PENDING   Sodium, urine, random     Status: None   Collection Time: 08/13/22  7:45 PM  Result Value Ref Range   Sodium, Ur 40 mmol/L    Comment: Performed at [facility] [address] [place], [place] [address]  Osmolality, urine     Status: None   Collection Time: 08/13/22  7:45 PM  Result Value Ref Range   Osmolality, Ur 366 300 - 900 mOsm/kg    Comment: Performed at [facility] [address], [place], [place] [address]  Blood culture (routine x 2)     Status: None (Preliminary result)   Collection Time: 08/13/22  9:18 PM   Specimen: BLOOD  Result Value Ref Range   Specimen Description      BLOOD BLOOD RIGHT FOREARM Performed at [facility], [address] [place], [place] [address]    Special Requests      BOTTLES DRAWN AEROBIC AND ANAEROBIC Blood Culture adequate volume Performed at [facility], [address], [place], [place] [address]    Culture      NO GROWTH < 12 HOURS Performed at [facility] [address], [place], [place] [address]    Report Status PENDING   Osmolality     Status: Abnormal   Collection Time: 08/13/22  9:29 PM  Result Value Ref Range   Osmolality 343 (HH) 275 - 295 mOsm/kg    Comment: CRITICAL RESULT CALLED TO, READ BACK BY AND VERIFIED WITH: RN Earleen Reaper [id number] @0118$  THANEY Performed at [facility] [address] [place], [place] [address]   TSH     Status: None   Collection Time: 08/13/22  9:29 PM  Result Value Ref Range   TSH 1.144 0.350 - 4.500 uIU/mL    Comment: Performed by a 3rd Generation assay with a functional sensitivity of <=0.01 uIU/mL. Performed at [facility], [address] [place], [place] [address]   CBC with Differential/Platelet     Status: Abnormal   Collection Time: 08/14/22  3:35 AM  Result  Value Ref Range  WBC 19.3 (H) 4.0 - 10.5 K/uL   RBC 4.42 4.22 - 5.81 MIL/uL   Hemoglobin 11.8 (L) 13.0 - 17.0 g/dL   HCT 36.6 (L) 39.0 - 52.0 %   MCV 82.8 80.0 - 100.0 fL   MCH 26.7 26.0 - 34.0 pg   MCHC 32.2 30.0 - 36.0 g/dL   RDW 16.4 (H) 11.5 - 15.5 %   Platelets 311 150 - 400 K/uL   nRBC 0.1 0.0 - 0.2 %   Neutrophils Relative % 91 %   Neutro Abs 17.5 (H) 1.7 - 7.7 K/uL   Lymphocytes Relative 3 %   Lymphs Abs 0.5 (L) 0.7 - 4.0 K/uL   Monocytes Relative 5 %   Monocytes Absolute 1.0 0.1 - 1.0 K/uL   Eosinophils Relative 0 %   Eosinophils Absolute 0.1 0.0 - 0.5 K/uL   Basophils Relative 0 %   Basophils Absolute 0.0 0.0 - 0.1 K/uL   Immature Granulocytes 1 %   Abs Immature Granulocytes 0.21 (H) 0.00 - 0.07 K/uL    Comment: Performed at [facility], [address], [place], [place] [address]  Comprehensive metabolic panel     Status: Abnormal   Collection Time: 08/14/22  3:35 AM  Result Value Ref Range   Sodium 123 (L) 135 - 145 mmol/L   Potassium 5.7 (H) 3.5 - 5.1 mmol/L   Chloride 94 (L) 98 - 111 mmol/L   CO2 11 (L) 22 - 32 mmol/L   Glucose, Bld 123 (H) 70 - 99 mg/dL    Comment: Glucose reference range applies only to samples taken after fasting for at least 8 hours.   BUN 212 (H) 8 - 23 mg/dL   Creatinine, Ser 8.98 (H) 0.61 - 1.24 mg/dL   Calcium 7.0 (L) 8.9 - 10.3 mg/dL   Total Protein 8.5 (H) 6.5 - 8.1 g/dL   Albumin 2.8 (L) 3.5 - 5.0 g/dL   AST 38 15 - 41 U/L   ALT 55 (H) 0 - 44 U/L   Alkaline Phosphatase 90 38 - 126 U/L   Total Bilirubin 0.7 0.3 - 1.2 mg/dL   GFR, Estimated 6 (L) >60 mL/min    Comment: (NOTE) Calculated using the CKD-EPI Creatinine Equation (2021)    Anion gap 18 (H) 5 - 15    Comment: Performed at [facility], [place] [address], [place], [place] [address]  Magnesium     Status: Abnormal   Collection Time: 08/14/22  3:35 AM  Result Value Ref Range   Magnesium 2.5 (H) 1.7 - 2.4 mg/dL    Comment: Performed at [facility] [address] [place], [place] [address]  Phosphorus     Status: Abnormal   Collection Time: 08/14/22  3:35 AM  Result Value Ref Range   Phosphorus 9.2 (H) 2.5 - 4.6 mg/dL    Comment: Performed at [facility] [address] [place], [place] [address]  CK     Status: Abnormal   Collection Time: 08/14/22  3:35 AM  Result Value Ref Range   Total CK 746 (H) 49 - 397 U/L    Comment: Performed at [facility], [place] [address] [place], [place] [id number]  Basic metabolic panel     Status: Abnormal   Collection Time: 08/15/22  1:06 AM  Result Value Ref Range   Sodium 131 (L) 135 - 145 mmol/L    Comment: DELTA CHECK NOTED   Potassium 4.1 3.5 - 5.1  mmol/L   Chloride 98 98 - 111 mmol/L   CO2 14 (L) 22 - 32 mmol/L   Glucose, Bld 124 (H) 70 - 99 mg/dL    Comment: Glucose reference range applies only to samples taken after fasting for at least 8 hours.   BUN 181 (H) 8 - 23 mg/dL   Creatinine, Ser 6.79 (H) 0.61 - 1.24 mg/dL   Calcium 6.5 (L) 8.9 - 10.3 mg/dL   GFR, Estimated 8 (L) >60 mL/min    Comment: (NOTE) Calculated using the CKD-EPI Creatinine Equation (2021)    Anion gap 19 (H) 5 - 15    Comment: Performed at [facility] [address], [place], [place] [address]  CBC     Status: Abnormal   Collection Time: 08/15/22  1:06 AM  Result Value Ref Range   WBC 16.6 (H) 4.0 - 10.5 K/uL   RBC 4.00 (L) 4.22 - 5.81 MIL/uL   Hemoglobin 11.0 (L) 13.0 - 17.0 g/dL   HCT 31.9 (L) 39.0 - 52.0 %   MCV 79.8 (L) 80.0 - 100.0 fL   MCH 27.5 26.0 - 34.0 pg   MCHC 34.5 30.0 - 36.0 g/dL   RDW 16.4 (H) 11.5 - 15.5 %   Platelets 272 150 - 400 K/uL   nRBC 0.0 0.0 - 0.2 %    Comment: Performed at [facility], [place] [address], [place], [place] [address]   CT ABDOMEN PELVIS WO CONTRAST  Result Date: 08/13/2022 CLINICAL DATA:  Left lower quadrant abdominal pain. EXAM: CT ABDOMEN AND PELVIS WITHOUT CONTRAST TECHNIQUE: Multidetector CT imaging of the abdomen and pelvis was performed  following the standard protocol without IV contrast. RADIATION DOSE REDUCTION: This exam was performed according to the departmental dose-optimization program which includes automated exposure control, adjustment of the mA and/or kV according to patient size and/or use of iterative reconstruction technique. COMPARISON:  CT abdomen pelvis dated 04/24/2022. FINDINGS: Lower chest: No acute abnormality. Hepatobiliary: No focal liver abnormality is seen. No gallstones, gallbladder wall thickening, or biliary dilatation. Pancreas: Unremarkable. No pancreatic ductal dilatation or surrounding inflammatory changes. Spleen: Normal in size without focal abnormality. Adrenals/Urinary Tract: Adrenal glands are unremarkable. A 9 cm cyst in the right kidney is redemonstrated. No imaging follow-up is recommended for this finding. No renal calculi are seen on either side there is severe bilateral hydroureteronephrosis, unchanged from prior exam. The urinary bladder is distended and demonstrates bladder wall thickening with at least one diverticulum. Stomach/Bowel: Stomach is within normal limits. Appendix appears normal. No evidence of bowel wall thickening, distention, or inflammatory changes. Vascular/Lymphatic: Aortic atherosclerosis. No enlarged abdominal or pelvic lymph nodes. Reproductive: The prostate is enlarged, measuring 5.2 cm in transverse dimension. This results in mass effect on the bladder. Other: No abdominal wall hernia or abnormality. No abdominopelvic ascites. Musculoskeletal: Degenerative changes are seen in the spine. IMPRESSION: 1. No acute findings in the abdomen or pelvis. 2. Severe bilateral hydroureteronephrosis, unchanged from prior exam. Distended urinary bladder with bladder wall thickening and at least one diverticulum. Findings may reflect chronic bladder outlet obstruction. Aortic Atherosclerosis (ICD10-I70.0). Electronically Signed   By: Zerita Boers M.D.   On: 08/13/2022 19:30   DG Ribs  Unilateral W/Chest Left  Result Date: 08/13/2022 CLINICAL DATA:  Fatigue frequent fall EXAM: LEFT RIBS AND CHEST - 3+ VIEW COMPARISON:  04/24/2022, chest CT 11/17/2018 FINDINGS: Single view chest demonstrates atelectasis or scarring at the lingula. No pleural effusion or pneumothorax. Left rib series demonstrates no acute  displaced left rib fracture. Old left fourth through sixth lateral rib fractures. IMPRESSION: No acute displaced left rib fracture. Old left fourth through sixth rib fractures. Negative for pneumothorax or pleural effusion. Atelectasis or scar at the lingula Electronically Signed   By: Donavan Foil M.D.   On: 08/13/2022 18:14    PMH:   Past Medical History:  Diagnosis Date   Allergic rhinitis    Anxiety    Asthma    as a child   Chronic low back pain    Complication of anesthesia    "hard to put asleep, hard to wake up, nausea and vomiting"   Depression    ED (erectile dysfunction)    GERD (gastroesophageal reflux disease)    HLD (hyperlipidemia)    HTN (hypertension)    sees Dr. [name], [facility]   Hypertrophy of prostate with urinary obstruction and other lower urinary tract symptoms (LUTS)    IBS (irritable bowel syndrome)    Inguinal hernia without mention of obstruction or gangrene, unilateral or unspecified, (not specified as recurrent)    Insomnia    Lumbar herniated disc    L7   Morbid obesity ([place])    Narcotic addiction ([place])    NICM (nonischemic cardiomyopathy) ([place])    tachycardia mediated,  resolved with sinus   Persistent atrial fibrillation ([place])    ablation done 03/2006 at [facility]   Pneumonia    hx of 2004   PONV (postoperative nausea and vomiting)    Twitching    legs    PSH:   Past Surgical History:  Procedure Laterality Date   ATRIAL ABLATION SURGERY  2007   [facility]   CARDIOVERSION  08/11/2011   Procedure: CARDIOVERSION;  Surgeon: Lelon Perla, MD;  Location: [facility];  Service: Cardiovascular;  Laterality: N/A;   CARDIOVERSION  N/A 04/16/2017   Procedure: CARDIOVERSION;  Surgeon: Sanda Klein, MD;  Location: [facility] ENDOSCOPY;  Service: Cardiovascular;  Laterality: N/A;   CARDIOVERSION N/A 03/04/2019   Procedure: CARDIOVERSION;  Surgeon: Lelon Perla, MD;  Location: [facility] ENDOSCOPY;  Service: Cardiovascular;  Laterality: N/A;   CARDIOVERSION N/A 07/04/2019   Procedure: CARDIOVERSION;  Surgeon: [name], MD;  Location: [facility] ENDOSCOPY;  Service: Cardiovascular;  Laterality: N/A;   EYE SURGERY     lasik, [place] Right 08/26/2012   Procedure: LAPAROSCOPIC INGUINAL HERNIA;  Surgeon: Madilyn Hook, DO;  Location: [facility];  Service: General;  Laterality: Right;  laparoscopic right inguinal hernia repair with mesh, umbilical hernia repair   INSERTION OF MESH Right 08/26/2012   Procedure: INSERTION OF MESH;  Surgeon: Madilyn Hook, DO;  Location: [facility];  Service: General;  Laterality: Right;  right inguinal hernia   TEE WITHOUT CARDIOVERSION N/A 03/04/2019   Procedure: TRANSESOPHAGEAL ECHOCARDIOGRAM (TEE);  Surgeon: Lelon Perla, MD;  Location: [facility];  Service: Cardiovascular;  Laterality: N/A;   UMBILICAL HERNIA REPAIR N/A 08/26/2012   Procedure: HERNIA REPAIR UMBILICAL ADULT;  Surgeon: Madilyn Hook, DO;  Location: [place];  Service: General;  Laterality: N/A;    Allergies:  Allergies  Allergen Reactions   Baclofen Other (See Comments)    Severe dizziness   Doxycycline Hyclate Other (See Comments)    Flu-like symptoms   Gabapentin Swelling and Other (See Comments)    Swelling in feet   Hycodan [Hydrocodone Bit-Homatrop Mbr] Other (See Comments)    Dizziness    Hydrocodone-Acetaminophen Other (See Comments)  GI upset, Dizziness   Pregabalin Swelling and Other (See Comments)    Severly dry mouth    Medications:   Prior to Admission medications   Medication Sig Start Date End Date Taking? Authorizing Provider  apixaban (ELIQUIS) 5 MG TABS tablet TAKE 1  TABLET BY MOUTH TWICE A DAY 07/25/22  Yes Crenshaw, Denice Bors, MD  baclofen (LIORESAL) 10 MG tablet Take 2.5 mg by mouth 2 (two) times daily as needed for muscle spasms.   Yes [provider]  carvedilol (COREG) 25 MG tablet Take 25 mg by mouth 2 (two) times daily with a meal.   Yes [provider]  diltiazem (CARDIZEM CD) 360 MG 24 hr capsule Take 1 capsule (360 mg total) by mouth daily. Please keep scheduled appointment for additional refills. Patient taking differently: Take 360 mg by mouth daily. 04/16/22  Yes Lelon Perla, MD  DULoxetine (CYMBALTA) 60 MG capsule Take 60 mg by mouth 2 (two) times daily. 02/26/21  Yes [provider]  methocarbamol (ROBAXIN) 750 MG tablet Take 750 mg by mouth every 6 (six) hours as needed for muscle spasms.   Yes [provider]  omeprazole (PRILOSEC) 20 MG capsule Take 20 mg by mouth daily as needed (for acid reflux).   Yes [provider]  rOPINIRole (REQUIP) 1 MG tablet Take 1-2 tablets (1-2 mg total) by mouth at bedtime as needed (restless leg syndrome). 1 tablet after lunch and 2 tablet at bedtime Patient taking differently: Take 1-4 mg by mouth See admin instructions. Take 1 mg by mouth in the morning and 4 mg at bedtime 08/07/21  Yes Crenshaw, Denice Bors, MD  tiZANidine (ZANAFLEX) 4 MG tablet Take 4 mg by mouth every 8 (eight) hours as needed for muscle spasms.   Yes [provider]  torsemide (DEMADEX) 20 MG tablet Take 60 mg by mouth at bedtime.   Yes [provider]  TYLENOL 500 MG tablet Take 1,000 mg by mouth every 6 (six) hours as needed for mild pain or headache.   Yes [provider]  carvedilol (COREG) 12.5 MG tablet Take 1 tablet (12.5 mg total) by mouth 2 (two) times daily with a meal. Patient not taking: Reported on 08/14/2022 04/28/22   Hosie Poisson, MD  finasteride (PROSCAR) 5 MG tablet Take 1 tablet (5 mg total) by mouth daily. Patient not taking: Reported on 08/14/2022 04/28/22    Hosie Poisson, MD  tamsulosin (FLOMAX) 0.4 MG CAPS capsule Take 1 capsule (0.4 mg total) by mouth daily after supper. Patient not taking: Reported on 08/14/2022 04/27/22   Hosie Poisson, MD    Discontinued Meds:   Medications Discontinued During This Encounter  Medication Reason   lactated ringers infusion    0.9 %  sodium chloride infusion    fentaNYL (SUBLIMAZE) injection 50 mcg    melatonin tablet 3 mg    acetaminophen (TYLENOL) tablet 650 mg    acetaminophen (TYLENOL) suppository 650 mg    metoprolol tartrate (LOPRESSOR) injection 5 mg    naloxone (NARCAN) injection 0.4 mg    ondansetron (ZOFRAN) injection 4 mg    lactated ringers infusion    HYDROmorphone (DILAUDID) injection 0.5 mg    tiZANidine (ZANAFLEX) 4 MG tablet    LORazepam (ATIVAN) 0.5 MG tablet    tadalafil (CIALIS) 5 MG tablet    montelukast (SINGULAIR) 10 MG tablet    aspirin EC 81 MG tablet    gabapentin (NEURONTIN) 100 MG capsule    rOPINIRole (REQUIP) tablet 3  mg    0.9 %  sodium chloride infusion    heparin injection 5,000 Units     Social History:  reports that he has never smoked. He has never used smokeless tobacco. He reports that he does not drink alcohol and does not use drugs.  Family History:   Family History  Problem Relation Age of Onset   Asthma Mother    Breast cancer Mother    Hypertension Mother    COPD Father    Prostate cancer Father    Hypertension Father    Lymphoma Sister     Blood pressure (!) 129/90, pulse (!) 106, temperature 98.4 F (36.9 C), resp. rate 19, height 6' 4.5" (1.943 m), weight 121.4 kg, SpO2 100 %. General appearance: fatigued, no distress, and slowed mentation Back: no CVA tenderness Resp: clear to auscultation bilaterally Cardio: irregularly irregular rhythm, S1, S2 normal, and no rub Extremities: no edema, chronic wounds of lower extremities, pedal pulses 2+ bilaterally, flapping tremor of right wrist c/w asterixis Skin: raised, erythematous coalescing rash  on anterior thighs c/w contact dermatitis Neurologic: Mental status: Alert, oriented, thought content appropriate, alert and oriented, slowed mentation, appropriate speech  Wells Guiles, DO 08/15/2022, 10:22 AM

## 2022-08-15 NOTE — Progress Notes (Signed)
Pt refuses CPAP therapy tonight. RT explained importance of CPAP. Pt states he has never worn one. Pt continues to refuses. PT understands he can request CPAP if he changes his mind.

## 2022-08-16 DIAGNOSIS — N320 Bladder-neck obstruction: Secondary | ICD-10-CM | POA: Diagnosis not present

## 2022-08-16 DIAGNOSIS — N179 Acute kidney failure, unspecified: Secondary | ICD-10-CM | POA: Diagnosis not present

## 2022-08-16 DIAGNOSIS — N189 Chronic kidney disease, unspecified: Secondary | ICD-10-CM | POA: Diagnosis not present

## 2022-08-16 DIAGNOSIS — E860 Dehydration: Secondary | ICD-10-CM | POA: Diagnosis not present

## 2022-08-16 LAB — HEPATITIS B SURFACE ANTIBODY, QUANTITATIVE: Hep B S AB Quant (Post): <3.1 m[IU]/mL — ABNORMAL LOW (ref >9.9)

## 2022-08-16 LAB — CBC
HCT: 31.9 % — ABNORMAL LOW (ref 39.0–52.0)
Hemoglobin: 10.7 g/dL — ABNORMAL LOW (ref 13.0–17.0)
MCH: 27 pg (ref 26.0–34.0)
MCHC: 33.5 g/dL (ref 30.0–36.0)
MCV: 80.6 fL (ref 80.0–100.0)
Platelets: 263 10*3/uL (ref 150–400)
RBC: 3.96 MIL/uL — ABNORMAL LOW (ref 4.22–5.81)
RDW: 16.6 % — ABNORMAL HIGH (ref 11.5–15.5)
WBC: 17.6 10*3/uL — ABNORMAL HIGH (ref 4.0–10.5)
nRBC: 0 % (ref 0.0–0.2)

## 2022-08-16 LAB — BASIC METABOLIC PANEL
Anion gap: 13 (ref 5–15)
BUN: 97 mg/dL — ABNORMAL HIGH (ref 8–23)
CO2: 24 mmol/L (ref 22–32)
Calcium: 6.7 mg/dL — ABNORMAL LOW (ref 8.9–10.3)
Chloride: 96 mmol/L — ABNORMAL LOW (ref 98–111)
Creatinine, Ser: 3.64 mg/dL — ABNORMAL HIGH (ref 0.61–1.24)
GFR, Estimated: 18 mL/min — ABNORMAL LOW (ref >60)
Glucose, Bld: 114 mg/dL — ABNORMAL HIGH (ref 70–99)
Potassium: 3.4 mmol/L — ABNORMAL LOW (ref 3.5–5.1)
Sodium: 133 mmol/L — ABNORMAL LOW (ref 135–145)

## 2022-08-16 LAB — PHOSPHORUS: Phosphorus: 4.3 mg/dL (ref 2.5–4.6)

## 2022-08-16 LAB — MAGNESIUM: Magnesium: 1.7 mg/dL (ref 1.7–2.4)

## 2022-08-16 MED ORDER — FAMOTIDINE IN NACL 20-0.9 MG/50ML-% IV SOLN
20.0000 mg | INTRAVENOUS | Status: DC
  Administered 2022-08-16: 20 mg via INTRAVENOUS
  Filled 2022-08-16 (×2): qty 50

## 2022-08-16 MED ORDER — POTASSIUM CHLORIDE CRYS ER 20 MEQ PO TBCR
60.0000 meq | EXTENDED_RELEASE_TABLET | Freq: Once | ORAL | Status: AC
  Administered 2022-08-16: 60 meq via ORAL
  Filled 2022-08-16: qty 3

## 2022-08-16 MED ORDER — METOPROLOL TARTRATE 5 MG/5ML IV SOLN: 5.0000 mg | Freq: Once | INTRAVENOUS | Status: DC

## 2022-08-16 MED ORDER — PREDNISONE 20 MG PO TABS
20.0000 mg | ORAL_TABLET | Freq: Every day | ORAL | Status: AC
  Administered 2022-08-16 – 2022-08-18 (×3): 20 mg via ORAL
  Filled 2022-08-16 (×3): qty 1

## 2022-08-16 MED ORDER — QUETIAPINE FUMARATE 25 MG PO TABS
25.0000 mg | ORAL_TABLET | Freq: Every day | ORAL | Status: DC
  Administered 2022-08-16: 25 mg via ORAL
  Filled 2022-08-16: qty 1

## 2022-08-16 MED ORDER — NYSTATIN 100000 UNIT/ML MT SUSP: 5.0000 mL | Freq: Four times a day (QID) | OROMUCOSAL | Status: DC

## 2022-08-16 NOTE — Progress Notes (Signed)
At approximately 0630, patient indicated to this nurse that he wanted to go home.  I asked why and he would only state he wanted to go home.  At bedside report, his wife is now at the bedside.  She states that patient called her several times during the night stating he wanted to go home and did not want hemodialysis.  She has multiple concerns and questions in reference to what would happen if he stopped hemodialysis and went home.  She does not feel that she can safely take care of him at home.  The patient indicated to his wife that he does not want to do hemodialysis.  I introduced the possibility of a palliative care consult to her and explained their role.  She is interested in this.  The day shift RN will get with attending to see if we can coordinate this for this family.  I encouraged her to express her concerns to his doctors as they round during the day.  I encouraged the patient to stay and to share his concerns with his doctors.  Earleen Reaper RN

## 2022-08-16 NOTE — Progress Notes (Signed)
Patient with atrial fibrillation with RVR.  Heart rate up to 140.  Patient given IV labetalol.  Home dose of Cardizem and Coreg given.  Patient has a rash, per nursing per nursing the 1 fingers.  She noted it last night.  Patient had no complaint of itching.  Rash on legs, now on hands.  Per patient the rash is new.  Prednisone and Pepcid ordered.

## 2022-08-16 NOTE — Evaluation (Signed)
Physical Therapy Evaluation Patient Details Name: [name] MRN: [id number] DOB: 20-Aug-1955 Today's Date: 08/16/2022  History of Present Illness  [name] 67 y.o. male who presented with fatigue, n/v and on-going leg cramps. He was found to have a presenting Cr of 10.08, .PMHx: stage 4 CKD, afib on Eliquis, chronic diastolic CHF,  HTN, HLD, and BPH  Clinical Impression  Pt admitted with above diagnosis. Min guard to min assist for mobility. Ambulates with RW without physical assistance today. Wife present during session. Pt needs frequent cues for direction throughout session. No overt LOB with RW for support. Pending pt and family wishes, may need to update Recs at d/c, currently appropriate for HHPT and RW, wife available 24/7 but unable to provide significant physical help at home if he has any decline prior to d/c. Pt currently with functional limitations due to the deficits listed below (see PT Problem List). Pt will benefit from skilled PT to increase their independence and safety with mobility to allow discharge to the venue listed below.          Recommendations for follow up therapy are one component of a multi-disciplinary discharge planning process, led by the attending physician.  Recommendations may be updated based on patient status, additional functional criteria and insurance authorization.  Follow Up Recommendations Home health PT (pending prognosis and wishes.)      Assistance Recommended at Discharge Frequent or constant Supervision/Assistance  Patient can return home with the following  A little help with walking and/or transfers;A little help with bathing/dressing/bathroom;Assistance with cooking/housework;Help with stairs or ramp for entrance;Assist for transportation    Equipment Recommendations Rolling walker (2 wheels) (May need to change pending prognosis - w/c if pt declines prior to d/c.)  Recommendations for Other Services       Functional  Status Assessment Patient has had a recent decline in their functional status and demonstrates the ability to make significant improvements in function in a reasonable and predictable amount of time.     Precautions / Restrictions Precautions Precautions: Fall Restrictions Weight Bearing Restrictions: No      Mobility  Bed Mobility Overal bed mobility: Needs Assistance Bed Mobility: Supine to Sit     Supine to sit: Min assist     General bed mobility comments: Min assist for pt to pull through therapist hand. Cues for technique.    Transfers Overall transfer level: Needs assistance Equipment used: Rolling walker (2 wheels) Transfers: Sit to/from Stand Sit to Stand: Min guard           General transfer comment: Min guard for safety. Cues for technique. Performed x2 from bed.    Ambulation/Gait Ambulation/Gait assistance: Min guard Gait Distance (Feet): 50 Feet Assistive device: Rolling walker (2 wheels) Gait Pattern/deviations: Step-through pattern Gait velocity: decr Gait velocity interpretation: <1.31 ft/sec, indicative of household ambulator Pre-gait activities: standing march General Gait Details: Educated on safe AD use with RW for support. Cues for upright posture, walker placement and direction with turns. No buckling or overt LOB with device. Min guard for safety throughout.  Stairs            Wheelchair Mobility    Modified Rankin (Stroke Patients Only)       Balance Overall balance assessment: Needs assistance Sitting-balance support: No upper extremity supported, Feet supported Sitting balance-Leahy Scale: Fair     Standing balance support: During functional activity, Single extremity supported Standing balance-Leahy Scale: Poor  Pertinent Vitals/Pain Pain Assessment Pain Assessment: Faces Faces Pain Scale: Hurts little more Pain Location: Rt thigh Pain Descriptors / Indicators: Aching Pain  Intervention(s): Monitored during session, Repositioned    Home Living Family/patient expects to be discharged to:: Private residence Living Arrangements: Spouse/significant other Available Help at Discharge: Family;Available 24 hours/day Type of Home: House Home Access: Stairs to enter Entrance Stairs-Rails: Right Entrance Stairs-Number of Steps: 1 (1+1) Alternate Level Stairs-Number of Steps: flight Home Layout: Two level;Able to live on main level with bedroom/bathroom Home Equipment: Cane - single point;Grab bars - tub/shower      Prior Function Prior Level of Function : Independent/Modified Independent;History of Falls (last six months)             Mobility Comments: Started using SPC recently  due to falls. ADLs Comments: ind     Hand Dominance   Dominant Hand: Right    Extremity/Trunk Assessment   Upper Extremity Assessment Upper Extremity Assessment: Defer to OT evaluation    Lower Extremity Assessment Lower Extremity Assessment: Generalized weakness    Cervical / Trunk Assessment Cervical / Trunk Assessment: Normal  Communication   Communication: No difficulties  Cognition Arousal/Alertness: Lethargic, Suspect due to medications Behavior During Therapy: Flat affect Overall Cognitive Status: Within Functional Limits for tasks assessed                                 General Comments: Pt oriented with delay        General Comments General comments (skin integrity, edema, etc.): wife present    Exercises     Assessment/Plan    PT Assessment Patient needs continued PT services  PT Problem List Decreased strength;Decreased activity tolerance;Decreased mobility;Decreased balance;Decreased knowledge of use of DME;Decreased cognition;Obesity       PT Treatment Interventions DME instruction;Gait training;Functional mobility training;Therapeutic activities;Therapeutic exercise;Balance training;Neuromuscular re-education;Patient/family  education;Stair training    PT Goals (Current goals can be found in the Care Plan section)  Acute Rehab PT Goals Patient Stated Goal: Go home PT Goal Formulation: With patient/family Time For Goal Achievement: 08/23/22 Potential to Achieve Goals: Fair    Frequency Min 3X/week     Co-evaluation               AM-PAC PT "6 Clicks" Mobility  Outcome Measure Help needed turning from your back to your side while in a flat bed without using bedrails?: None Help needed moving from lying on your back to sitting on the side of a flat bed without using bedrails?: A Little Help needed moving to and from a bed to a chair (including a wheelchair)?: A Little Help needed standing up from a chair using your arms (e.g., wheelchair or bedside chair)?: A Little Help needed to walk in hospital room?: A Little Help needed climbing 3-5 steps with a railing? : A Lot 6 Click Score: 18    End of Session Equipment Utilized During Treatment: Gait belt Activity Tolerance: Patient tolerated treatment well Patient left: in chair;with call bell/phone within reach;with chair alarm set;with family/visitor present Nurse Communication: Mobility status PT Visit Diagnosis: Other abnormalities of gait and mobility (R26.89);Unsteadiness on feet (R26.81);Muscle weakness (generalized) (M62.81);Difficulty in walking, not elsewhere classified (R26.2)    Time: [id number] PT Time Calculation (min) (ACUTE ONLY): 34 min   Charges:   PT Evaluation $PT Eval Low Complexity: 1 Low PT Treatments $Gait Training: 8-22 mins        Candie Mile,  PT, DPT Physical Therapist [facility] [place]   Ellouise Newer 08/16/2022, 1:14 PM

## 2022-08-16 NOTE — Progress Notes (Signed)
Pt said does not wear cpap at home and does not want one.

## 2022-08-16 NOTE — Progress Notes (Signed)
  Progress Note   Patient: [name] [id number] DOB: Nov 25, 1955 DOA: 08/13/2022     2 DOS: the patient was seen and examined on 08/16/2022   Brief hospital course: 67 y.o. male with medical history significant of stage 4 CKD, afib on Eliquis, chronic diastolic CHF,  HTN, HLD, and BPH presenting with fatigue, n/v. Pt also complained of on-going leg cramps.   Pt found to have a presenting Cr of 10.08. Reportedly recently treated with bactrim. Pt found to have obstructive uropathy with foley cath being placed at time of presenattion  Assessment and Plan: AKI on stage 4 CKD -Baseline creatinine is 3.5-4.5.   -Presented with Cr over 10, improved to 9 after foley placed at time of presentation -Cont to avoid ACEI and NSAIDs -Nephrology consulted. Had been on bicarb gtt. Cr did trend down somewhat this AM -HD was recommended by Nephro, underwent first session 2/9 -This AM, more HD was recommended by Nephro, however later, pt declined further treatment -Have consulted Palliative Care for goals of care   Uremia -Patient has had delirium that is likely related to uremia -Continue duloxetine   Afib -Rate controlled with diltiazem, carvedilol -Cont on eliquis   HTN -Continue carvedilol, diltiazem  UTI with sepsis present on admit -Presented with WBC 20.4 with UA suggestive of UTI, toxic metabolic encephalopathy -UA suggestive of UTI -Urine cx pos for pseudomonas, abx transitioned to cefepime  Toxic metabolic encephalopathy -secondary to UTI --Pt's wife reports baseline day/night reversal PTA. Overnight, noted to be agitated -Most recent QTc 453. Have ordered QHS seroquel  Hyponatremia -improving -Cont follow bmet trends  Hyperkalemia -resolved -check bmet in AM  Metabolic acidosis -secondary to renal failure -somewhat improved  hypokalemia      Subjective: No complaints this AM. Staff reports increased agitation overnight  Physical Exam: Vitals:   08/16/22  0221 08/16/22 0524 08/16/22 0859 08/16/22 1211  BP:  138/87 (!) 141/89 127/77  Pulse:  (!) 110 83   Resp: '16 20 20   '$ Temp:  98 F (36.7 C) 97.9 F (36.6 C)   TempSrc:  Oral Oral   SpO2:  100% 96%   Weight:  121 kg    Height:       General exam: Awake, laying in bed, in nad Respiratory system: Normal respiratory effort, no wheezing Cardiovascular system: regular rate, s1, s2 Gastrointestinal system: Soft, nondistended, positive BS Central nervous system: CN2-12 grossly intact, strength intact Extremities: Perfused, no clubbing Skin: Normal skin turgor, no notable skin lesions seen Psychiatry: Mood normal // no visual hallucinations   Data Reviewed:  Labs reviewed: Na 133, K 3.4, Cr 3.64, WBC 17.6  Family Communication: Pt in room, family at bedside  Disposition: Status is: Inpatient Remains inpatient appropriate because: Severity of illness  Planned Discharge Destination:  Unclear at this time    Author: Marylu Lund, MD 08/16/2022 2:42 PM  For on call review [email or web address].

## 2022-08-16 NOTE — Progress Notes (Signed)
[facility] Progress Note   Subjective:   tolerated HD fine but did not like having to sit still.  Declining further dialysis today.    Objective Vitals:   08/16/22 0221 08/16/22 0524 08/16/22 0859 08/16/22 1211  BP:  138/87 (!) 141/89 127/77  Pulse:  (!) 110 83   Resp: 16 20 20   $ Temp:  98 F (36.7 C) 97.9 F (36.6 C)   TempSrc:  Oral Oral   SpO2:  100% 96%   Weight:  121 kg    Height:       Physical Exam General: looks more awake and alert up in chair Heart: RRR Lungs: normal WOB on RA Extremities: no edema Dialysis Access:  RIJ temp HD cath GU: foley draining clear yellow urine Neuro:  more alert and conversant today  Additional Objective Labs: Basic Metabolic Panel: Recent Labs  Lab 08/14/22 0335 08/15/22 0106 08/16/22 0406  NA 123* 131* 133*  K 5.7* 4.1 3.4*  CL 94* 98 96*  CO2 11* 14* 24  GLUCOSE 123* 124* 114*  BUN 212* 181* 97*  CREATININE 8.98* 6.79* 3.64*  CALCIUM 7.0* 6.5* 6.7*  PHOS 9.2*  --  4.3   Liver Function Tests: Recent Labs  Lab 08/13/22 1753 08/14/22 0335  AST 39 38  ALT 59* 55*  ALKPHOS 96 90  BILITOT 0.5 0.7  PROT 9.6* 8.5*  ALBUMIN 3.6 2.8*   No results for input(s): "LIPASE", "AMYLASE" in the last 168 hours. CBC: Recent Labs  Lab 08/13/22 1753 08/13/22 1803 08/14/22 0335 08/15/22 0106 08/16/22 0406  WBC 20.4*  --  19.3* 16.6* 17.6*  NEUTROABS 18.4*  --  17.5*  --   --   HGB 12.0*   < > 11.8* 11.0* 10.7*  HCT 35.9*   < > 36.6* 31.9* 31.9*  MCV 80.1  --  82.8 79.8* 80.6  PLT 337  --  311 272 263   < > = values in this interval not displayed.   Blood Culture    Component Value Date/Time   SDES  08/13/2022 2254    BLOOD BLOOD RIGHT HAND Performed at [facility], [address] [place], [place] [address]    [facility]  08/13/2022 2254    BOTTLES DRAWN AEROBIC AND ANAEROBIC Blood Culture adequate volume Performed at [facility], [address],  [place], [place] [address]    CULT  08/13/2022 2254    NO GROWTH 2 DAYS Performed at [facility] [address] [place], [place] [address]    REPTSTATUS PENDING 08/13/2022 2254    Cardiac Enzymes: Recent Labs  Lab 08/14/22 0335  CKTOTAL 746*   CBG: No results for input(s): "GLUCAP" in the last 168 hours. Iron Studies: No results for input(s): "IRON", "TIBC", "TRANSFERRIN", "FERRITIN" in the last 72 hours. @lablastinr3$ @ Studies/Results: IR Fluoro Guide CV Line Right  Result Date: 08/15/2022 INDICATION: 67 year old male with acute renal failure. He requires temporary hemodialysis access. EXAM: IR ULTRASOUND GUIDANCE VASC ACCESS RIGHT; IR RIGHT FLUORO GUIDE CV LINE MEDICATIONS: None. ANESTHESIA/SEDATION: None. FLUOROSCOPY TIME:  Radiation exposure index: 9 mGy reference air kerma COMPLICATIONS: None immediate. PROCEDURE: Informed written consent was obtained from the patient after a thorough discussion of the procedural risks, benefits and alternatives. All questions were addressed. Maximal Sterile Barrier Technique was utilized including caps, mask, sterile gowns, sterile gloves, sterile drape, hand hygiene and skin antiseptic. A timeout was performed prior to the initiation of the procedure. The right internal jugular vein was  interrogated with ultrasound and found to be widely patent. An image was obtained and stored for the medical record. Local anesthesia was attained by infiltration with 1% lidocaine. A small dermatotomy was made. Under real-time sonographic guidance, the vessel was punctured with a 21 gauge micropuncture needle. Using standard technique, the initial micro needle was exchanged over a 0.018 micro wire for a transitional 4 [place] micro sheath. The micro sheath was then exchanged over a 0.035 wire for a fascial dilator and the cutaneous tract was dilated. A 16 cm triple-lumen non tunneled hemodialysis catheter was then advanced over the wire and positioned with the catheter  tip at the cavoatrial junction. All 3 lumens flushed and aspirated easily. The lumens were flushed, capped and the catheter secured in place with 0 Prolene suture. Sterile dressings were placed. IMPRESSION: Successful placement of a 16 cm temporary triple-lumen hemodialysis catheter. The catheter tips are in the distal SVC just above the cavoatrial junction. The catheter is ready for immediate use. Electronically Signed   By: Jacqulynn Cadet M.D.   On: 08/15/2022 17:18   IR US Guide Vasc Access Right  Result Date: 08/15/2022 INDICATION: 67 year old male with acute renal failure. He requires temporary hemodialysis access. EXAM: IR ULTRASOUND GUIDANCE VASC ACCESS RIGHT; IR RIGHT FLUORO GUIDE CV LINE MEDICATIONS: None. ANESTHESIA/SEDATION: None. FLUOROSCOPY TIME:  Radiation exposure index: 9 mGy reference air kerma COMPLICATIONS: None immediate. PROCEDURE: Informed written consent was obtained from the patient after a thorough discussion of the procedural risks, benefits and alternatives. All questions were addressed. Maximal Sterile Barrier Technique was utilized including caps, mask, sterile gowns, sterile gloves, sterile drape, hand hygiene and skin antiseptic. A timeout was performed prior to the initiation of the procedure. The right internal jugular vein was interrogated with ultrasound and found to be widely patent. An image was obtained and stored for the medical record. Local anesthesia was attained by infiltration with 1% lidocaine. A small dermatotomy was made. Under real-time sonographic guidance, the vessel was punctured with a 21 gauge micropuncture needle. Using standard technique, the initial micro needle was exchanged over a 0.018 micro wire for a transitional 4 [place] micro sheath. The micro sheath was then exchanged over a 0.035 wire for a fascial dilator and the cutaneous tract was dilated. A 16 cm triple-lumen non tunneled hemodialysis catheter was then advanced over the wire and positioned  with the catheter tip at the cavoatrial junction. All 3 lumens flushed and aspirated easily. The lumens were flushed, capped and the catheter secured in place with 0 Prolene suture. Sterile dressings were placed. IMPRESSION: Successful placement of a 16 cm temporary triple-lumen hemodialysis catheter. The catheter tips are in the distal SVC just above the cavoatrial junction. The catheter is ready for immediate use. Electronically Signed   By: Jacqulynn Cadet M.D.   On: 08/15/2022 17:18   Medications:  ceFEPime (MAXIPIME) IV 1 g (08/15/22 2322)   famotidine (PEPCID) IV 20 mg (08/16/22 0213)   sodium bicarbonate 150 mEq in sterile water 1,150 mL infusion 100 mL/hr at 08/16/22 [id number]    apixaban  5 mg Oral BID   carvedilol  12.5 mg Oral BID WC   Chlorhexidine Gluconate Cloth  6 each Topical Daily   diltiazem  360 mg Oral Daily   finasteride  5 mg Oral Daily   predniSONE  20 mg Oral Q breakfast   QUEtiapine  25 mg Oral QHS   rOPINIRole  2 mg Oral QHS   sodium chloride flush  3 mL Intravenous Q12H  tamsulosin  0.4 mg Oral QPC supper     Assessment/Plan:  AKI on CKD:  Admitted 04/2022 with urinary obstruction Cr 5, foley placed and improved to 3.5.  Foley removed by urology a few weeks later at [facility]. 05/2022 Cr 3.4 with PCP.  Returned 2/7 with weakness - BUN 218/ Cr 10, imaging with BL hydro.  Foley replaced and had some improvement in BUN/Cr to 181/6.8 but given uremic symptoms and suspected longstanding obstruction recommended (expect slow recovery) HD and had temp IJ line and 1st treatment 2/9.  Labs appropriately improved and he clinically looks better.  He does not desire further dialysis but isn't able to give much in the way of explanation -- I think he's probably still a bit uremic but this decision to forgo further HD is in line with what his wife told me initially.  I recommended 1 more HD today to make sure he's not having any uremic symptoms clouding his judgement but left it up to him.   He wishes to d/w wife.  HD order remains but he can decline if they come to transport him.   If he declines may want to involve palliative care.  However, I think with bladder decompressed now there is a chance that he'll recover enough renal function that he won't be at imminent risk for death, however only time will tell how much recovery he will have.  2.  Hyponatremia:  improving since foley placed + dialysis 3.  Hyperkalemia: normalized  4.  Paroxysmal A fib: per primary 5.  Anemia:  Hb 10.7, no indication for ESA currently 6.  UTI, pseudomonal:  On cefepime 7.  Rash: doesn't appear vasculitic - appears more consistent with contact dermatitis. Being treated with pred starting 2/10. Preceeded abx. 8. Metabolic acidosis: improved, stop na bicarb gtt  WIll follow, reach out with issues.   Jannifer Hick MD 08/16/2022, 12:15 PM  [facility] Pager: [phone]

## 2022-08-16 NOTE — Evaluation (Signed)
Occupational Therapy Evaluation Patient Details Name: [name] MRN: [id number] DOB: 12-Jul-1955 Today's Date: 08/16/2022   History of Present Illness [name] 67 y.o. male who presented with fatigue, n/v and on-going leg cramps. He was found to have a presenting Cr of 10.08, .PMHx: stage 4 CKD, afib on Eliquis, chronic diastolic CHF,  HTN, HLD, and BPH   Clinical Impression   [name] was evaluated s/p the above admission list, he was indep prior to admission 04/2022, however since that admission he has been generally declining in health and physical function (multiple falls). Upon evaluation he was limited by general malaise, weakness, unsteady gait, impulsivity and decreased activity tolerance. Overall he required min A for transfers and mobility with RW, min A for grooming at the sink and up to mod A for LB ADLs. Pt's wife present and verbalized goal for pt to d/c home with increased quality of life being the main focus (pt and wife both verbalizing desire to stop HD, with understanding with the medical implications). Pt's wife plannin to provide 24/7 direct hands on assist. OT to follow acutely. Recommend d/c home with [place].      Recommendations for follow up therapy are one component of a multi-disciplinary discharge planning process, led by the attending physician.  Recommendations may be updated based on patient status, additional functional criteria and insurance authorization.   Follow Up Recommendations  Home health OT     Assistance Recommended at Discharge Frequent or constant Supervision/Assistance  Patient can return home with the following A little help with walking and/or transfers;A lot of help with bathing/dressing/bathroom;Assistance with cooking/housework;Assistance with feeding;Direct supervision/assist for medications management;Direct supervision/assist for financial management;Assist for transportation;Help with stairs or ramp for entrance    Functional  Status Assessment  Patient has had a recent decline in their functional status and demonstrates the ability to make significant improvements in function in a reasonable and predictable amount of time.  Equipment Recommendations  BSC/3in1;Other (comment) (RW)    Recommendations for Other Services Other (comment) (Palliative)     Precautions / Restrictions Precautions Precautions: Fall Restrictions Weight Bearing Restrictions: No      Mobility Bed Mobility               General bed mobility comments: in chair upon arrival    Transfers Overall transfer level: Needs assistance Equipment used: Rolling walker (2 wheels) Transfers: Sit to/from Stand Sit to Stand: Min assist                  Balance Overall balance assessment: Needs assistance Sitting-balance support: No upper extremity supported Sitting balance-Leahy Scale: Fair Sitting balance - Comments: poir tolerance for unsupported sitting   Standing balance support: Single extremity supported, During functional activity Standing balance-Leahy Scale: Fair Standing balance comment: needs at least 1 UE supported                           ADL either performed or assessed with clinical judgement   ADL Overall ADL's : Needs assistance/impaired Eating/Feeding: Independent;Sitting   Grooming: Minimal assistance;Standing Grooming Details (indicate cue type and reason): at the sink Upper Body Bathing: Minimal assistance;Sitting   Lower Body Bathing: Sit to/from stand;Moderate assistance   Upper Body Dressing : Minimal assistance;Sitting   Lower Body Dressing: Moderate assistance;Sit to/from stand   Toilet Transfer: Minimal assistance;Ambulation;Rolling walker (2 wheels)   Toileting- Clothing Manipulation and Hygiene: Min guard;Sitting/lateral lean       Functional mobility  during ADLs: Minimal assistance;Rolling walker (2 wheels) General ADL Comments: benefits from cues, impulsive, unsteady,  slowed processing     Vision Baseline Vision/History: 0 No visual deficits Vision Assessment?: No apparent visual deficits     Perception Perception Perception Tested?: No   Praxis Praxis Praxis tested?: Not tested    Pertinent Vitals/Pain Pain Assessment Pain Assessment: Faces Faces Pain Scale: Hurts a little bit Pain Location: Rt thigh Pain Descriptors / Indicators: Aching Pain Intervention(s): Limited activity within patient's tolerance, Monitored during session     Hand Dominance Right   Extremity/Trunk Assessment Upper Extremity Assessment Upper Extremity Assessment: Defer to OT evaluation   Lower Extremity Assessment Lower Extremity Assessment: Defer to PT evaluation   Cervical / Trunk Assessment Cervical / Trunk Assessment: Normal   Communication Communication Communication: No difficulties   Cognition Arousal/Alertness: Awake/alert Behavior During Therapy: Flat affect Overall Cognitive Status: Within Functional Limits for tasks assessed                                 General Comments: slow processing, flat affect. difficult conversation prior to session about continuing HD or not     General Comments  VSS on RA, wife present and supportive    Exercises     Shoulder Instructions      Home Living Family/patient expects to be discharged to:: Private residence Living Arrangements: Spouse/significant other Available Help at Discharge: Family;Available 24 hours/day Type of Home: House Home Access: Stairs to enter [employer] of Steps: 1 Entrance Stairs-Rails: Right Home Layout: Two level;Able to live on main level with bedroom/bathroom Alternate Level Stairs-Number of Steps: flight Alternate Level Stairs-Rails: Right Bathroom Shower/Tub: Walk-in shower     Bathroom Accessibility: Yes   Home Equipment: Cane - single point;Grab bars - tub/shower          Prior Functioning/Environment Prior Level of Function :  Independent/Modified Independent;History of Falls (last six months)             Mobility Comments: Started using SPC recently  due to falls. ADLs Comments: ind        OT Problem List: Decreased strength;Decreased range of motion;Decreased activity tolerance;Impaired balance (sitting and/or standing);Decreased safety awareness;Decreased knowledge of use of DME or AE;Decreased knowledge of precautions      OT Treatment/Interventions: Self-care/ADL training;Therapeutic exercise;DME and/or AE instruction;Therapeutic activities;Patient/family education;Balance training    OT Goals(Current goals can be found in the care plan section) Acute Rehab OT Goals Patient Stated Goal: home OT Goal Formulation: With patient Time For Goal Achievement: 08/30/22 Potential to Achieve Goals: Good ADL Goals Pt Will Perform Grooming: with modified independence;standing Pt Will Perform Upper Body Dressing: sitting Pt Will Perform Lower Body Dressing: with supervision;sit to/from stand Pt Will Transfer to Toilet: with supervision;ambulating Additional ADL Goal #1: pt will tolerate at least 3x standing ADLs without sitting rest break needed  OT Frequency: Min 2X/week    Co-evaluation              AM-PAC OT "6 Clicks" Daily Activity     Outcome Measure Help from another person eating meals?: None Help from another person taking care of personal grooming?: A Little Help from another person toileting, which includes using toliet, bedpan, or urinal?: A Little Help from another person bathing (including washing, rinsing, drying)?: A Lot Help from another person to put on and taking off regular upper body clothing?: A Little Help from another person to put  on and taking off regular lower body clothing?: A Lot 6 Click Score: 17   End of Session Equipment Utilized During Treatment: Rolling walker (2 wheels);Gait belt Nurse Communication: Mobility status  Activity Tolerance: Patient tolerated  treatment well Patient left: in chair;with call bell/phone within reach;with chair alarm set  OT Visit Diagnosis: Unsteadiness on feet (R26.81);Other abnormalities of gait and mobility (R26.89);Muscle weakness (generalized) (M62.81);Adult, failure to thrive (R62.7);Pain                Time: 1026-1056 OT Time Calculation (min): 30 min Charges:  OT General Charges $OT Visit: 1 Visit OT Evaluation $OT Eval Moderate Complexity: 1 Mod OT Treatments $Self Care/Home Management : 8-22 mins  Shade Flood, OTR/L Acute Rehabilitation Services Office [place] Communication Preferred   Elliot Cousin 08/16/2022, 12:33 PM

## 2022-08-16 NOTE — Progress Notes (Signed)
MEWS Progress Note  Patient Details Name: [name] MRN: [id number] DOB: 04-04-1956 Today's Date: 08/16/2022   MEWS Flowsheet Documentation:  Assess: MEWS Score Temp: 98 F (36.7 C) BP: 138/87 MAP (mmHg): 101 Pulse Rate: (!) 110 ECG Heart Rate: (!) 109 Resp: 20 Level of Consciousness: Alert SpO2: 100 % O2 Device: Room Air Patient Activity (if Appropriate): In bed Assess: MEWS Score MEWS Temp: 0 MEWS Systolic: 0 MEWS Pulse: 1 MEWS RR: 0 MEWS LOC: 0 MEWS Score: 1 MEWS Score Color: Green Assess: SIRS CRITERIA SIRS Temperature : 0 SIRS Respirations : 0 SIRS Pulse: 1 SIRS WBC: 1 SIRS Score Sum : 2 SIRS Temperature : 0 SIRS Pulse: 1 SIRS Respirations : 0 SIRS WBC: 0 SIRS Score Sum : 1 Assess: if the MEWS score is Yellow or Red Were vital signs taken at a resting state?: Yes Does the patient meet 2 or more of the SIRS criteria?: Yes Does the patient have a confirmed or suspected source of infection?: Yes Provider and Rapid Response Notified?: Yes MEWS guidelines implemented : Yes, yellow Treat MEWS Interventions: Considered administering scheduled or prn medications/treatments as ordered Take Vital Signs Increase Vital Sign Frequency : Yellow: Q2hr x1, continue Q4hrs until patient remains green for 12hrs Escalate MEWS: Escalate: Yellow: Discuss with charge nurse and consider notifying provider and/or RRT Notify: Charge Nurse/RN Name of Charge Nurse/RN Notified: Percell Boston RN Provider Notification Provider Name/Title: Dr. Claria Dice Date Provider Notified: 08/15/22 Time Provider Notified: 2346 Method of Notification: Page Notification Reason: Change in status Provider response: At bedside Date of Provider Response: 08/15/22   Patient is back from his first hemodialysis treatment.  His heart rhythm is AFIB and rate is consistently 120 - 140s.  No c/o CP.  IV  Labetelol given at 2151 with minimal response.  Upon review of scheduled medications, it was  noted that PO diltiazem and PO Coreg was held due to patient going to hemodialysis. This was given at [facility].  Dr. Ernesto Rutherford came to bedside to assess patient.  She is aware patient is in Yellow MEWS and protocol was initiated.  Also, patient had new rash to lower abdomen and bilateral hands.  PO prednisone and IV Pepcid ordered and given.  Will continue to monitor patient.  Clinton Reaper RN   Linus Galas 08/16/2022, 8:19 AM

## 2022-08-17 DIAGNOSIS — N179 Acute kidney failure, unspecified: Secondary | ICD-10-CM | POA: Diagnosis not present

## 2022-08-17 DIAGNOSIS — N189 Chronic kidney disease, unspecified: Secondary | ICD-10-CM | POA: Diagnosis not present

## 2022-08-17 LAB — RENAL FUNCTION PANEL
Albumin: 2.3 g/dL — ABNORMAL LOW (ref 3.5–5.0)
Anion gap: 13 (ref 5–15)
BUN: 78 mg/dL — ABNORMAL HIGH (ref 8–23)
CO2: 24 mmol/L (ref 22–32)
Calcium: 7.7 mg/dL — ABNORMAL LOW (ref 8.9–10.3)
Chloride: 96 mmol/L — ABNORMAL LOW (ref 98–111)
Creatinine, Ser: 3.26 mg/dL — ABNORMAL HIGH (ref 0.61–1.24)
GFR, Estimated: 20 mL/min — ABNORMAL LOW (ref >60)
Glucose, Bld: 146 mg/dL — ABNORMAL HIGH (ref 70–99)
Phosphorus: 2.9 mg/dL (ref 2.5–4.6)
Potassium: 3.1 mmol/L — ABNORMAL LOW (ref 3.5–5.1)
Sodium: 133 mmol/L — ABNORMAL LOW (ref 135–145)

## 2022-08-17 LAB — CBC
HCT: 30.9 % — ABNORMAL LOW (ref 39.0–52.0)
Hemoglobin: 9.7 g/dL — ABNORMAL LOW (ref 13.0–17.0)
MCH: 26.5 pg (ref 26.0–34.0)
MCHC: 31.4 g/dL (ref 30.0–36.0)
MCV: 84.4 fL (ref 80.0–100.0)
Platelets: 234 10*3/uL (ref 150–400)
RBC: 3.66 MIL/uL — ABNORMAL LOW (ref 4.22–5.81)
RDW: 16.7 % — ABNORMAL HIGH (ref 11.5–15.5)
WBC: 17.5 10*3/uL — ABNORMAL HIGH (ref 4.0–10.5)
nRBC: 0 % (ref 0.0–0.2)

## 2022-08-17 LAB — BASIC METABOLIC PANEL
Anion gap: 12 (ref 5–15)
BUN: 78 mg/dL — ABNORMAL HIGH (ref 8–23)
CO2: 25 mmol/L (ref 22–32)
Calcium: 7.6 mg/dL — ABNORMAL LOW (ref 8.9–10.3)
Chloride: 95 mmol/L — ABNORMAL LOW (ref 98–111)
Creatinine, Ser: 3.33 mg/dL — ABNORMAL HIGH (ref 0.61–1.24)
GFR, Estimated: 20 mL/min — ABNORMAL LOW (ref >60)
Glucose, Bld: 147 mg/dL — ABNORMAL HIGH (ref 70–99)
Potassium: 3.1 mmol/L — ABNORMAL LOW (ref 3.5–5.1)
Sodium: 132 mmol/L — ABNORMAL LOW (ref 135–145)

## 2022-08-17 MED ORDER — QUETIAPINE FUMARATE 25 MG PO TABS
12.5000 mg | ORAL_TABLET | Freq: Every day | ORAL | Status: DC
  Administered 2022-08-17: 12.5 mg via ORAL
  Filled 2022-08-17: qty 1

## 2022-08-17 MED ORDER — NYSTATIN 100000 UNIT/ML MT SUSP
5.0000 mL | Freq: Three times a day (TID) | OROMUCOSAL | Status: DC
  Administered 2022-08-17 – 2022-08-18 (×6): 500000 [IU] via ORAL
  Filled 2022-08-17 (×6): qty 5

## 2022-08-17 MED ORDER — FAMOTIDINE 20 MG PO TABS
20.0000 mg | ORAL_TABLET | Freq: Every day | ORAL | Status: DC
  Administered 2022-08-17 – 2022-08-18 (×2): 20 mg via ORAL
  Filled 2022-08-17 (×2): qty 1

## 2022-08-17 MED ORDER — POTASSIUM CHLORIDE CRYS ER 20 MEQ PO TBCR
40.0000 meq | EXTENDED_RELEASE_TABLET | Freq: Two times a day (BID) | ORAL | Status: AC
  Administered 2022-08-17 (×2): 40 meq via ORAL
  Filled 2022-08-17 (×2): qty 2

## 2022-08-17 NOTE — Progress Notes (Signed)
  Progress Note   Patient: [name] [id number] DOB: 1956-05-22 DOA: 08/13/2022     3 DOS: the patient was seen and examined on 08/17/2022   Brief hospital course: 67 y.o. male with medical history significant of stage 4 CKD, afib on Eliquis, chronic diastolic CHF,  HTN, HLD, and BPH presenting with fatigue, n/v. Pt also complained of on-going leg cramps.   Pt found to have a presenting Cr of 10.08. Reportedly recently treated with bactrim. Pt found to have obstructive uropathy with foley cath being placed at time of presenattion  Assessment and Plan: AKI on stage 4 CKD -Baseline creatinine is 3.5-4.5.   -Presented with Cr over 10, improved to 9 after foley placed at time of presentation -Cont to avoid ACEI and NSAIDs -Nephrology consulted. Had been on bicarb gtt. -HD was recommended by Nephro, underwent first session 2/9 -Pt had declined further HD sessions -Cr has trended down with medical management. -Discussed with Nephrology who states pt may be able to d/c as early as tomorrow with very close outpatient follow up -Pt to go to [facility] Thursday 2/15 for labs   Uremia -Patient has had delirium that is likely related to uremia -Continue duloxetine   Afib -Rate controlled with diltiazem, carvedilol -Cont on eliquis   HTN -Continue carvedilol, diltiazem  UTI with sepsis present on admit -Presented with WBC 20.4 with UA suggestive of UTI, toxic metabolic encephalopathy -UA suggestive of UTI -Urine cx pos for pseudomonas, abx transitioned to cefepime  Toxic metabolic encephalopathy -secondary to UTI --Pt's wife reports baseline day/night reversal PTA. Overnight, noted to be agitated -Most recent QTc 453.  -Still drowsy this AM. Have decreased seroquel to 12.'5mg'$   Hyponatremia -improving -Cont follow bmet trends  Hyperkalemia -resolved -check bmet in AM  Metabolic acidosis -secondary to renal failure -somewhat improved  Hypokalemia -will replace       Subjective: Drowsy this AM. Pt's wife reports pt slept a lot better since starting seroquel  Physical Exam: Vitals:   08/16/22 2013 08/17/22 0421 08/17/22 0900 08/17/22 1632  BP: 132/77 136/86 (!) 138/100 (!) 143/90  Pulse: 81 89 (!) 102 100  Resp: '17 17 18 18  '$ Temp: 97.9 F (36.6 C) 98.4 F (36.9 C) 98 F (36.7 C) 98.1 F (36.7 C)  TempSrc: Oral  Oral Oral  SpO2: 100% 98% 99% 98%  Weight:      Height:       General exam: Conversant, in no acute distress Respiratory system: normal chest rise, clear, no audible wheezing Cardiovascular system: regular rhythm, s1-s2 Gastrointestinal system: Nondistended, nontender, pos BS Central nervous system: No seizures, no tremors Extremities: No cyanosis, no joint deformities Skin: No rashes, no pallor Psychiatry: Affect normal // no auditory hallucinations   Data Reviewed:  Labs reviewed: Na 133, K 3.1, Cr 3.26  Family Communication: Pt in room, family at bedside  Disposition: Status is: Inpatient Remains inpatient appropriate because: Severity of illness  Planned Discharge Destination: Home    Author: Marylu Lund, MD 08/17/2022 5:11 PM  For on call review [email or web address].

## 2022-08-17 NOTE — Progress Notes (Signed)
[facility] Progress Note   Subjective:   chose to forgo any further dialysis.  This AM no new issues.  Wife and son bedside.  UOP 2L yesterday. Labs showing improvement in kidney function with Cr now 3.3, K 3.1 (repleting).   Objective Vitals:   08/16/22 1654 08/16/22 2013 08/17/22 0421 08/17/22 0900  BP: (!) 149/90 132/77 136/86 (!) 138/100  Pulse: 88 81 89 (!) 102  Resp: 17 17 17 18  $ Temp: 98.2 F (36.8 C) 97.9 F (36.6 C) 98.4 F (36.9 C) 98 F (36.7 C)  TempSrc:  Oral  Oral  SpO2: 100% 100% 98% 99%  Weight:      Height:       Physical Exam General: looks more awake and alert in bed Heart: RRR Lungs: normal WOB on RA Extremities: no edema Dialysis Access:  RIJ temp HD cath GU: foley draining clear yellow urine Neuro:  more alert and conversant today  Additional Objective Labs: Basic Metabolic Panel: Recent Labs  Lab 08/14/22 0335 08/15/22 0106 08/16/22 0406 08/17/22 1109 08/17/22 1110  NA 123*   < > 133* 132* 133*  K 5.7*   < > 3.4* 3.1* 3.1*  CL 94*   < > 96* 95* 96*  CO2 11*   < > 24 25 24  $ GLUCOSE 123*   < > 114* 147* 146*  BUN 212*   < > 97* 78* 78*  CREATININE 8.98*   < > 3.64* 3.33* 3.26*  CALCIUM 7.0*   < > 6.7* 7.6* 7.7*  PHOS 9.2*  --  4.3  --  2.9   < > = values in this interval not displayed.    Liver Function Tests: Recent Labs  Lab 08/13/22 1753 08/14/22 0335 08/17/22 1110  AST 39 38  --   ALT 59* 55*  --   ALKPHOS 96 90  --   BILITOT 0.5 0.7  --   PROT 9.6* 8.5*  --   ALBUMIN 3.6 2.8* 2.3*    No results for input(s): "LIPASE", "AMYLASE" in the last 168 hours. CBC: Recent Labs  Lab 08/13/22 1753 08/13/22 1803 08/14/22 0335 08/15/22 0106 08/16/22 0406 08/17/22 1109  WBC 20.4*  --  19.3* 16.6* 17.6* 17.5*  NEUTROABS 18.4*  --  17.5*  --   --   --   HGB 12.0*   < > 11.8* 11.0* 10.7* 9.7*  HCT 35.9*   < > 36.6* 31.9* 31.9* 30.9*  MCV 80.1  --  82.8 79.8* 80.6 84.4  PLT 337  --  311 272 263 234   < > =  values in this interval not displayed.    Blood Culture    Component Value Date/Time   SDES  08/13/2022 2254    BLOOD BLOOD RIGHT HAND Performed at [facility], [address] [place], [place] [address]    [facility]  08/13/2022 2254    BOTTLES DRAWN AEROBIC AND ANAEROBIC Blood Culture adequate volume Performed at [facility], [address] [place], [place] [address]    CULT  08/13/2022 2254    NO GROWTH 3 DAYS Performed at [facility] [address], [place], [place] [address]    REPTSTATUS PENDING 08/13/2022 2254    Cardiac Enzymes: Recent Labs  Lab 08/14/22 0335  CKTOTAL 746*    CBG: No results for input(s): "GLUCAP" in the last 168 hours. Iron Studies: No results for input(s): "IRON", "TIBC", "TRANSFERRIN", "FERRITIN" in the last 72 hours. @lablastinr3$ @ Studies/Results: IR  Fluoro Guide CV Line Right  Result Date: 08/15/2022 INDICATION: 67 year old male with acute renal failure. He requires temporary hemodialysis access. EXAM: IR ULTRASOUND GUIDANCE VASC ACCESS RIGHT; IR RIGHT FLUORO GUIDE CV LINE MEDICATIONS: None. ANESTHESIA/SEDATION: None. FLUOROSCOPY TIME:  Radiation exposure index: 9 mGy reference air kerma COMPLICATIONS: None immediate. PROCEDURE: Informed written consent was obtained from the patient after a thorough discussion of the procedural risks, benefits and alternatives. All questions were addressed. Maximal Sterile Barrier Technique was utilized including caps, mask, sterile gowns, sterile gloves, sterile drape, hand hygiene and skin antiseptic. A timeout was performed prior to the initiation of the procedure. The right internal jugular vein was interrogated with ultrasound and found to be widely patent. An image was obtained and stored for the medical record. Local anesthesia was attained by infiltration with 1% lidocaine. A small dermatotomy was made. Under real-time sonographic guidance, the vessel was  punctured with a 21 gauge micropuncture needle. Using standard technique, the initial micro needle was exchanged over a 0.018 micro wire for a transitional 4 [place] micro sheath. The micro sheath was then exchanged over a 0.035 wire for a fascial dilator and the cutaneous tract was dilated. A 16 cm triple-lumen non tunneled hemodialysis catheter was then advanced over the wire and positioned with the catheter tip at the cavoatrial junction. All 3 lumens flushed and aspirated easily. The lumens were flushed, capped and the catheter secured in place with 0 Prolene suture. Sterile dressings were placed. IMPRESSION: Successful placement of a 16 cm temporary triple-lumen hemodialysis catheter. The catheter tips are in the distal SVC just above the cavoatrial junction. The catheter is ready for immediate use. Electronically Signed   By: Jacqulynn Cadet M.D.   On: 08/15/2022 17:18   IR US Guide Vasc Access Right  Result Date: 08/15/2022 INDICATION: 67 year old male with acute renal failure. He requires temporary hemodialysis access. EXAM: IR ULTRASOUND GUIDANCE VASC ACCESS RIGHT; IR RIGHT FLUORO GUIDE CV LINE MEDICATIONS: None. ANESTHESIA/SEDATION: None. FLUOROSCOPY TIME:  Radiation exposure index: 9 mGy reference air kerma COMPLICATIONS: None immediate. PROCEDURE: Informed written consent was obtained from the patient after a thorough discussion of the procedural risks, benefits and alternatives. All questions were addressed. Maximal Sterile Barrier Technique was utilized including caps, mask, sterile gowns, sterile gloves, sterile drape, hand hygiene and skin antiseptic. A timeout was performed prior to the initiation of the procedure. The right internal jugular vein was interrogated with ultrasound and found to be widely patent. An image was obtained and stored for the medical record. Local anesthesia was attained by infiltration with 1% lidocaine. A small dermatotomy was made. Under real-time sonographic guidance,  the vessel was punctured with a 21 gauge micropuncture needle. Using standard technique, the initial micro needle was exchanged over a 0.018 micro wire for a transitional 4 [place] micro sheath. The micro sheath was then exchanged over a 0.035 wire for a fascial dilator and the cutaneous tract was dilated. A 16 cm triple-lumen non tunneled hemodialysis catheter was then advanced over the wire and positioned with the catheter tip at the cavoatrial junction. All 3 lumens flushed and aspirated easily. The lumens were flushed, capped and the catheter secured in place with 0 Prolene suture. Sterile dressings were placed. IMPRESSION: Successful placement of a 16 cm temporary triple-lumen hemodialysis catheter. The catheter tips are in the distal SVC just above the cavoatrial junction. The catheter is ready for immediate use. Electronically Signed   By: Jacqulynn Cadet M.D.   On: 08/15/2022 17:18   Medications:  ceFEPime (MAXIPIME) IV 1 g (08/16/22 2140)    apixaban  5 mg Oral BID   carvedilol  12.5 mg Oral BID WC   Chlorhexidine Gluconate Cloth  6 each Topical Daily   diltiazem  360 mg Oral Daily   famotidine  20 mg Oral Daily   finasteride  5 mg Oral Daily   nystatin  5 mL Oral TID AC & HS   potassium chloride  40 mEq Oral BID   predniSONE  20 mg Oral Q breakfast   QUEtiapine  12.5 mg Oral QHS   rOPINIRole  2 mg Oral QHS   sodium chloride flush  3 mL Intravenous Q12H   tamsulosin  0.4 mg Oral QPC supper     Assessment/Plan:  AKI on CKD:  Admitted 04/2022 with urinary obstruction Cr 5, foley placed and improved to 3.5.  Foley removed by urology a few weeks later at [place]. 05/2022 Cr 3.4 with PCP.  Returned 2/7 with weakness - BUN 218/ Cr 10, imaging with BL hydro.  Rec'd 1 HD 2/9 but then declined further. Thankfully today labs showing continued improvement with BUN 78, Cr 3.3.  UOP has been adequate.  Does not need dialysis as such will d/c pall care consult.  Will need 4-6 weeks to know where kidney  function stabilizes out.  He's agreeable to outpt f/u.  BOO - needs urology f/u arranged for d/c.  Hyponatremia:  improving since foley placed  3.  Hyperkalemia: repleting  4.  Paroxysmal A fib: per primary 5.  Anemia:  Hb 10.7, no indication for ESA currently 6.  UTI, pseudomonal:  On cefepime, can complete outpt course with cipro - have pharm dose. 7.  Rash: doesn't appear vasculitic - appears more consistent with contact dermatitis. Being treated with pred starting 2/10. Preceeded abx. 8. Metabolic acidosis: improved  Nothing further to add -- will f/u with him in neph clinic in a few weeks; will have him get labs mid week for me to review.  Please have him go to [place] 2/15 for labs - I will order. I will see him in clinic in about   Would keep him until tomorrow to replete K, recheck, get PT needs set up etc.  D/w primary.  Jannifer Hick MD 08/17/2022, 12:56 PM  [facility] Pager: [phone]

## 2022-08-18 ENCOUNTER — Other Ambulatory Visit: Payer: Self-pay | Admitting: Cardiology

## 2022-08-18 ENCOUNTER — Other Ambulatory Visit (HOSPITAL_COMMUNITY): Payer: Self-pay

## 2022-08-18 ENCOUNTER — Telehealth: Payer: Self-pay | Admitting: Cardiology

## 2022-08-18 DIAGNOSIS — N179 Acute kidney failure, unspecified: Secondary | ICD-10-CM | POA: Diagnosis not present

## 2022-08-18 DIAGNOSIS — N320 Bladder-neck obstruction: Secondary | ICD-10-CM | POA: Diagnosis not present

## 2022-08-18 DIAGNOSIS — N189 Chronic kidney disease, unspecified: Secondary | ICD-10-CM | POA: Diagnosis not present

## 2022-08-18 LAB — BASIC METABOLIC PANEL
Anion gap: 14 (ref 5–15)
BUN: 70 mg/dL — ABNORMAL HIGH (ref 8–23)
CO2: 23 mmol/L (ref 22–32)
Calcium: 8.1 mg/dL — ABNORMAL LOW (ref 8.9–10.3)
Chloride: 96 mmol/L — ABNORMAL LOW (ref 98–111)
Creatinine, Ser: 3.12 mg/dL — ABNORMAL HIGH (ref 0.61–1.24)
GFR, Estimated: 21 mL/min — ABNORMAL LOW (ref >60)
Glucose, Bld: 146 mg/dL — ABNORMAL HIGH (ref 70–99)
Potassium: 3.7 mmol/L (ref 3.5–5.1)
Sodium: 133 mmol/L — ABNORMAL LOW (ref 135–145)

## 2022-08-18 LAB — CBC
HCT: 31.4 % — ABNORMAL LOW (ref 39.0–52.0)
Hemoglobin: 9.8 g/dL — ABNORMAL LOW (ref 13.0–17.0)
MCH: 26.8 pg (ref 26.0–34.0)
MCHC: 31.2 g/dL (ref 30.0–36.0)
MCV: 85.8 fL (ref 80.0–100.0)
Platelets: 242 10*3/uL (ref 150–400)
RBC: 3.66 MIL/uL — ABNORMAL LOW (ref 4.22–5.81)
RDW: 17 % — ABNORMAL HIGH (ref 11.5–15.5)
WBC: 21.5 10*3/uL — ABNORMAL HIGH (ref 4.0–10.5)
nRBC: 0 % (ref 0.0–0.2)

## 2022-08-18 LAB — CULTURE, BLOOD (ROUTINE X 2)
Culture: NO GROWTH
Special Requests: ADEQUATE

## 2022-08-18 LAB — VITAMIN D 25 HYDROXY (VIT D DEFICIENCY, FRACTURES)

## 2022-08-18 MED ORDER — CIPROFLOXACIN HCL 750 MG PO TABS
750.0000 mg | ORAL_TABLET | Freq: Two times a day (BID) | ORAL | 0 refills | Status: AC
  Filled 2022-08-18: qty 6, 3d supply, fill #0

## 2022-08-18 MED ORDER — SODIUM CHLORIDE 0.9 % IV SOLN
2.0000 g | Freq: Two times a day (BID) | INTRAVENOUS | Status: DC
  Administered 2022-08-18: 2 g via INTRAVENOUS
  Filled 2022-08-18: qty 12.5

## 2022-08-18 MED ORDER — DILTIAZEM HCL ER COATED BEADS 360 MG PO CP24: 360.0000 mg | ORAL_CAPSULE | Freq: Every day | ORAL | 0 refills | Status: DC

## 2022-08-18 MED ORDER — QUETIAPINE FUMARATE 25 MG PO TABS
12.5000 mg | ORAL_TABLET | Freq: Every day | ORAL | 0 refills | Status: DC
  Filled 2022-08-18: qty 20, 40d supply, fill #0

## 2022-08-18 NOTE — Plan of Care (Signed)
  Problem: Health Behavior/Discharge Planning: Goal: Ability to manage health-related needs will improve Outcome: Adequate for Discharge   Problem: Clinical Measurements: Goal: Ability to maintain clinical measurements within normal limits will improve Outcome: Adequate for Discharge Goal: Will remain free from infection Outcome: Adequate for Discharge Goal: Diagnostic test results will improve Outcome: Adequate for Discharge Goal: Respiratory complications will improve Outcome: Adequate for Discharge Goal: Cardiovascular complication will be avoided Outcome: Adequate for Discharge   Problem: Activity: Goal: Risk for activity intolerance will decrease Outcome: Adequate for Discharge   Problem: Nutrition: Goal: Adequate nutrition will be maintained Outcome: Adequate for Discharge   Problem: Coping: Goal: Level of anxiety will decrease Outcome: Adequate for Discharge   Problem: Elimination: Goal: Will not experience complications related to bowel motility Outcome: Adequate for Discharge Goal: Will not experience complications related to urinary retention Outcome: Adequate for Discharge   Problem: Pain Managment: Goal: General experience of comfort will improve Outcome: Adequate for Discharge   Problem: Safety: Goal: Ability to remain free from injury will improve Outcome: Adequate for Discharge   Problem: Skin Integrity: Goal: Risk for impaired skin integrity will decrease Outcome: Adequate for Discharge   Problem: Education: Goal: Knowledge of disease and its progression will improve Outcome: Adequate for Discharge   Problem: Health Behavior/Discharge Planning: Goal: Ability to manage health-related needs will improve Outcome: Adequate for Discharge   Problem: Clinical Measurements: Goal: Complications related to the disease process or treatment will be avoided or minimized Outcome: Adequate for Discharge Goal: Dialysis access will remain free of  complications Outcome: Adequate for Discharge   Problem: Activity: Goal: Activity intolerance will improve Outcome: Adequate for Discharge   Problem: Fluid Volume: Goal: Fluid volume balance will be maintained or improved Outcome: Adequate for Discharge   Problem: Nutritional: Goal: Ability to make appropriate dietary choices will improve Outcome: Adequate for Discharge   Problem: Respiratory: Goal: Respiratory symptoms related to disease process will be avoided Outcome: Adequate for Discharge   Problem: Self-Concept: Goal: Body image disturbance will be avoided or minimized Outcome: Adequate for Discharge   Problem: Urinary Elimination: Goal: Progression of disease will be identified and treated Outcome: Adequate for Discharge   Problem: Acute Rehab OT Goals (only OT should resolve) Goal: Pt. Will Perform Grooming Outcome: Adequate for Discharge Goal: Pt. Will Perform Upper Body Dressing Outcome: Adequate for Discharge Goal: Pt. Will Perform Lower Body Dressing Outcome: Adequate for Discharge Goal: Pt. Will Transfer To Toilet Outcome: Adequate for Discharge Goal: OT Additional ADL Goal #1 Outcome: Adequate for Discharge   Problem: Acute Rehab PT Goals(only PT should resolve) Goal: Patient Will Transfer Sit To/From Stand Outcome: Adequate for Discharge Goal: Pt Will Ambulate Outcome: Adequate for Discharge Goal: Pt Will Go Up/Down Stairs Outcome: Adequate for Discharge

## 2022-08-18 NOTE — Progress Notes (Signed)
PHARMACY NOTE:  ANTIMICROBIAL RENAL DOSAGE ADJUSTMENT  Current antimicrobial regimen includes a mismatch between antimicrobial dosage and estimated renal function.  As per policy approved by the Pharmacy & Therapeutics and Medical Executive Committees, the antimicrobial dosage will be adjusted accordingly.  Current antimicrobial dosage:  cefepime 1g IV q24h  Indication: Pseudomonas UTI  Renal Function:  Estimated Creatinine Clearance: 33.8 mL/min (A) (by C-G formula based on SCr of 3.12 mg/dL (H)). []$      On intermittent HD, scheduled: []$      On CRRT    Antimicrobial dosage has been changed to:  cefepime 2g IV q12h  Additional comments: no further HD sessions planned   Thank you for allowing pharmacy to be a part of this patient's care.  Dimple Nanas, PharmD, BCPS 08/18/2022 8:43 AM

## 2022-08-18 NOTE — Progress Notes (Signed)
Explained discharge instructions to patient. Reviewed follow up appointment and next medication administration times. Also reviewed education. Patient verbalized having an understanding for instructions given. All belongings are in the patient's possession to include TOC meds. IV and telemetry were removed. CCMD was notified. No other needs verbalized. Transported downstairs for discharge.

## 2022-08-18 NOTE — TOC Transition Note (Addendum)
Transition of Care [facility]) - CM/SW Discharge Note   Patient Details  Name: [name] MRN: [id number] Date of Birth: 11-Feb-1956  [facility]) CM/SW Contact:  Tom-Johnson, Renea Ee, RN Phone Number: 08/18/2022, 12:32 PM   Clinical Narrative:     Patient is scheduled for discharge today. Home health recommended, patient declined. Requested Outpatient PT/OT. Order placed and info on AVS.  RW and BSC recommended, patient declined BSC. RW ordered from [employer] and Erasmo Downer to deliver to patient at bedside. Wife at bedside and will transport at discharge.  No further TOC needs noted.          Final next level of care: OP Rehab Barriers to Discharge: Barriers Resolved   Patient Goals and CMS Choice CMS Medicare.gov Compare Post Acute Care list provided to:: Patient Choice offered to / list presented to : Patient, Spouse  Discharge Placement                  Patient to be transferred to facility by: Wife      Discharge Plan and Services Additional resources added to the After Visit Summary for                  DME Arranged: [place] rolling DME Agency: [employer] Date DME Agency Contacted: 08/18/22 Time DME Agency Contacted: [phone] Representative spoke with at DME Agency: Erasmo Downer [place]: NA [place] Agency: NA        Social Determinants of Health ([place]) Interventions [place]: No Food Insecurity (08/18/2022)  Housing: Low Risk  (08/18/2022)  Transportation Needs: No Transportation Needs (08/18/2022)  Utilities: Not At Risk (08/18/2022)  Alcohol Screen: Low Risk  (04/17/2021)  Financial Resource Strain: Low Risk  (04/17/2021)  Tobacco Use: Low Risk  (08/15/2022)     Readmission Risk Interventions    04/28/2022    3:47 PM  Readmission Risk Prevention Plan  Transportation Screening Complete  PCP or Specialist Appt within 5-7 Days Complete  Home Care Screening Complete  Medication Review (RN CM) Complete

## 2022-08-18 NOTE — Telephone Encounter (Signed)
refills   *STAT* If patient is at the pharmacy, call can be transferred to refill team.   1. Which medications need to be refilled? (please list name of each medication and dose if known)   diltiazem (CARDIZEM CD) 360 MG 24 hr capsule    2. Which pharmacy/location (including street and city if local pharmacy) is medication to be sent to? [id number] [place], [place] [address]  3. Do they need a 30 day or 90 day supply? 90 day  Patient is completely out of medication.

## 2022-08-18 NOTE — Progress Notes (Signed)
PT Cancellation Note  Patient Details Name: DELOREAN DUFFY MRN: [id number] DOB: [birth year]   Cancelled Treatment:    Reason Eval/Treat Not Completed: Patient declined, no reason specified  Politely declines physical therapy services. Eager to return home. States he has been up walking around this morning and feels back to baseline. Wife agrees HHPT would be helpful for evaluation at home. Patient reluctant.  Ellouise Newer 08/18/2022, 9:03 AM

## 2022-08-18 NOTE — Discharge Summary (Signed)
Physician Discharge Summary   Patient: [name] MRN: [id number] DOB: 05-Oct-1955  Admit date:     08/13/2022  Discharge date: 08/18/22  Discharge Physician: Marylu Lund   PCP: Shirline Frees, MD   Recommendations at discharge:    Follow up with PCP in 1-2 weeks Follow up with Nephrology as scheduled Follow up with Urology as scheduled Pt to have labs done on 2/15 as per Nephrology, to be followed up by Nephrology as outpatient  Discharge Diagnoses: Principal Problem:   Acute kidney injury superimposed on chronic kidney disease ([place]) Active Problems:   Essential hypertension   Persistent atrial fibrillation ([place])   Uremia  Resolved Problems:   * No resolved hospital problems. *  Hospital Course: 67 y.o. male with medical history significant of stage 4 CKD, afib on Eliquis, chronic diastolic CHF,  HTN, HLD, and BPH presenting with fatigue, n/v. Pt also complained of on-going leg cramps.   Pt found to have a presenting Cr of 10.08. Reportedly recently treated with bactrim. Pt found to have obstructive uropathy with foley cath being placed at time of presenattion  Assessment and Plan: AKI on stage 4 CKD -Baseline creatinine is 3.5-4.5.   -Presented with Cr over 10, improved to 9 after foley placed at time of presentation -Cont to avoid ACEI and NSAIDs -Nephrology consulted. Had been on bicarb gtt. -HD was recommended by Nephro, underwent first session 2/9 -Pt had declined subsequent HD sessions -Cr has trended down with medical management. -Discussed with Nephrology who states pt may be able to d/c with very close outpatient follow up -Pt to go to [facility] Thursday 2/15 for labs   Uremia -Patient has had delirium that is likely related to uremia -Continue duloxetine -Mentation much improved   Afib -Rate controlled with diltiazem, carvedilol -Cont on eliquis   HTN -Continue carvedilol, diltiazem   UTI with sepsis present on admit -Presented with WBC 20.4  with UA suggestive of UTI, toxic metabolic encephalopathy -UA suggestive of UTI -Urine cx pos for pseudomonas, abx transitioned to cefepime and then to cipro for d/c to complete course   Toxic metabolic encephalopathy -secondary to UTI and uremia present on admit -Most recent QTc 453, improvement with low dose seroquel,will prescribe   Hyponatremia -improving   Hyperkalemia -resolved   Metabolic acidosis -secondary to renal failure -improved   Hypokalemia -replaced       Consultants: Nephrology Procedures performed:   Disposition: Home Diet recommendation:  Regular diet DISCHARGE MEDICATION: Allergies as of 08/18/2022       Reactions   Baclofen Other (See Comments)   Severe dizziness   Doxycycline Hyclate Other (See Comments)   Flu-like symptoms   Gabapentin Swelling, Other (See Comments)   Swelling in feet   Hycodan [hydrocodone Bit-homatrop Mbr] Other (See Comments)   Dizziness   Hydrocodone-acetaminophen Other (See Comments)   GI upset, Dizziness   Pregabalin Swelling, Other (See Comments)   Severly dry mouth        Medication List     STOP taking these medications    baclofen 10 MG tablet Commonly known as: LIORESAL   tiZANidine 4 MG tablet Commonly known as: ZANAFLEX   torsemide 20 MG tablet Commonly known as: DEMADEX       TAKE these medications    carvedilol 12.5 MG tablet Commonly known as: COREG Take 1 tablet (12.5 mg total) by mouth 2 (two) times daily with a meal. What changed: Another medication with the same name was removed. Continue taking this medication,  and follow the directions you see here.   diltiazem 360 MG 24 hr capsule Commonly known as: CARDIZEM CD Take 1 capsule (360 mg total) by mouth daily. Please keep scheduled appointment for additional refills. What changed: additional instructions   DULoxetine 60 MG capsule Commonly known as: CYMBALTA Take 60 mg by mouth 2 (two) times daily.   Eliquis 5 MG Tabs  tablet Generic drug: apixaban TAKE 1 TABLET BY MOUTH TWICE A DAY   finasteride 5 MG tablet Commonly known as: PROSCAR Take 1 tablet (5 mg total) by mouth daily.   methocarbamol 750 MG tablet Commonly known as: ROBAXIN Take 750 mg by mouth every 6 (six) hours as needed for muscle spasms.   omeprazole 20 MG capsule Commonly known as: PRILOSEC Take 20 mg by mouth daily as needed (for acid reflux).   QUEtiapine 25 MG tablet Commonly known as: SEROQUEL Take 0.5 tablets (12.5 mg total) by mouth at bedtime.   rOPINIRole 1 MG tablet Commonly known as: REQUIP Take 1-2 tablets (1-2 mg total) by mouth at bedtime as needed (restless leg syndrome). 1 tablet after lunch and 2 tablet at bedtime What changed:  how much to take when to take this additional instructions   tamsulosin 0.4 MG Caps capsule Commonly known as: FLOMAX Take 1 capsule (0.4 mg total) by mouth daily after supper.   TYLENOL 500 MG tablet Generic drug: acetaminophen Take 1,000 mg by mouth every 6 (six) hours as needed for mild pain or headache.               Durable Medical Equipment  (From admission, onward)           Start     Ordered   08/18/22 1143  For home use only DME Walker rolling  Once       Question Answer Comment  Walker: With 5 Inch Wheels   Patient needs a walker to treat with the following condition ARF (acute renal failure) ([place])      08/18/22 1142            Follow-up Information     Justin Mend, MD Follow up.   Specialty: Internal Medicine Why: Hospital follow up, as scheduled Contact information: [place] [address] [phone]         [facility] Follow up in 1 week(s).   Why: Hospital follow up, for foley catheter management Contact information: [place]        Shirline Frees, MD Follow up in 2 week(s).   Specialty: Family Medicine Why: Hospital follow up Contact  information: [place] [id number] [phone]                Discharge Exam: Danley Danker Weights   08/15/22 1842 08/15/22 2124 08/16/22 0524  Weight: 124.4 kg 127.2 kg 121 kg   General exam: Awake, laying in bed, in nad Respiratory system: Normal respiratory effort, no wheezing Cardiovascular system: regular rate, s1, s2 Gastrointestinal system: Soft, nondistended, positive BS Central nervous system: CN2-12 grossly intact, strength intact Extremities: Perfused, no clubbing Skin: Normal skin turgor, no notable skin lesions seen Psychiatry: Mood normal // no visual hallucinations   Condition at discharge: fair  The results of significant diagnostics from this hospitalization (including imaging, microbiology, ancillary and laboratory) are listed below for reference.   Imaging Studies: IR Fluoro Guide CV Line Right  Result Date: 08/15/2022 INDICATION: 67 year old male  with acute renal failure. He requires temporary hemodialysis access. EXAM: IR ULTRASOUND GUIDANCE VASC ACCESS RIGHT; IR RIGHT FLUORO GUIDE CV LINE MEDICATIONS: None. ANESTHESIA/SEDATION: None. FLUOROSCOPY TIME:  Radiation exposure index: 9 mGy reference air kerma COMPLICATIONS: None immediate. PROCEDURE: Informed written consent was obtained from the patient after a thorough discussion of the procedural risks, benefits and alternatives. All questions were addressed. Maximal Sterile Barrier Technique was utilized including caps, mask, sterile gowns, sterile gloves, sterile drape, hand hygiene and skin antiseptic. A timeout was performed prior to the initiation of the procedure. The right internal jugular vein was interrogated with ultrasound and found to be widely patent. An image was obtained and stored for the medical record. Local anesthesia was attained by infiltration with 1% lidocaine. A small dermatotomy was made. Under real-time sonographic guidance, the vessel was punctured with a 21 gauge  micropuncture needle. Using standard technique, the initial micro needle was exchanged over a 0.018 micro wire for a transitional 4 [place] micro sheath. The micro sheath was then exchanged over a 0.035 wire for a fascial dilator and the cutaneous tract was dilated. A 16 cm triple-lumen non tunneled hemodialysis catheter was then advanced over the wire and positioned with the catheter tip at the cavoatrial junction. All 3 lumens flushed and aspirated easily. The lumens were flushed, capped and the catheter secured in place with 0 Prolene suture. Sterile dressings were placed. IMPRESSION: Successful placement of a 16 cm temporary triple-lumen hemodialysis catheter. The catheter tips are in the distal SVC just above the cavoatrial junction. The catheter is ready for immediate use. Electronically Signed   By: Jacqulynn Cadet M.D.   On: 08/15/2022 17:18   IR US Guide Vasc Access Right  Result Date: 08/15/2022 INDICATION: 67 year old male with acute renal failure. He requires temporary hemodialysis access. EXAM: IR ULTRASOUND GUIDANCE VASC ACCESS RIGHT; IR RIGHT FLUORO GUIDE CV LINE MEDICATIONS: None. ANESTHESIA/SEDATION: None. FLUOROSCOPY TIME:  Radiation exposure index: 9 mGy reference air kerma COMPLICATIONS: None immediate. PROCEDURE: Informed written consent was obtained from the patient after a thorough discussion of the procedural risks, benefits and alternatives. All questions were addressed. Maximal Sterile Barrier Technique was utilized including caps, mask, sterile gowns, sterile gloves, sterile drape, hand hygiene and skin antiseptic. A timeout was performed prior to the initiation of the procedure. The right internal jugular vein was interrogated with ultrasound and found to be widely patent. An image was obtained and stored for the medical record. Local anesthesia was attained by infiltration with 1% lidocaine. A small dermatotomy was made. Under real-time sonographic guidance, the vessel was punctured  with a 21 gauge micropuncture needle. Using standard technique, the initial micro needle was exchanged over a 0.018 micro wire for a transitional 4 [place] micro sheath. The micro sheath was then exchanged over a 0.035 wire for a fascial dilator and the cutaneous tract was dilated. A 16 cm triple-lumen non tunneled hemodialysis catheter was then advanced over the wire and positioned with the catheter tip at the cavoatrial junction. All 3 lumens flushed and aspirated easily. The lumens were flushed, capped and the catheter secured in place with 0 Prolene suture. Sterile dressings were placed. IMPRESSION: Successful placement of a 16 cm temporary triple-lumen hemodialysis catheter. The catheter tips are in the distal SVC just above the cavoatrial junction. The catheter is ready for immediate use. Electronically Signed   By: Jacqulynn Cadet M.D.   On: 08/15/2022 17:18   CT ABDOMEN PELVIS WO CONTRAST  Result Date: 08/13/2022 CLINICAL DATA:  Left  lower quadrant abdominal pain. EXAM: CT ABDOMEN AND PELVIS WITHOUT CONTRAST TECHNIQUE: Multidetector CT imaging of the abdomen and pelvis was performed following the standard protocol without IV contrast. RADIATION DOSE REDUCTION: This exam was performed according to the departmental dose-optimization program which includes automated exposure control, adjustment of the mA and/or kV according to patient size and/or use of iterative reconstruction technique. COMPARISON:  CT abdomen pelvis dated 04/24/2022. FINDINGS: Lower chest: No acute abnormality. Hepatobiliary: No focal liver abnormality is seen. No gallstones, gallbladder wall thickening, or biliary dilatation. Pancreas: Unremarkable. No pancreatic ductal dilatation or surrounding inflammatory changes. Spleen: Normal in size without focal abnormality. Adrenals/Urinary Tract: Adrenal glands are unremarkable. A 9 cm cyst in the right kidney is redemonstrated. No imaging follow-up is recommended for this finding. No renal  calculi are seen on either side there is severe bilateral hydroureteronephrosis, unchanged from prior exam. The urinary bladder is distended and demonstrates bladder wall thickening with at least one diverticulum. Stomach/Bowel: Stomach is within normal limits. Appendix appears normal. No evidence of bowel wall thickening, distention, or inflammatory changes. Vascular/Lymphatic: Aortic atherosclerosis. No enlarged abdominal or pelvic lymph nodes. Reproductive: The prostate is enlarged, measuring 5.2 cm in transverse dimension. This results in mass effect on the bladder. Other: No abdominal wall hernia or abnormality. No abdominopelvic ascites. Musculoskeletal: Degenerative changes are seen in the spine. IMPRESSION: 1. No acute findings in the abdomen or pelvis. 2. Severe bilateral hydroureteronephrosis, unchanged from prior exam. Distended urinary bladder with bladder wall thickening and at least one diverticulum. Findings may reflect chronic bladder outlet obstruction. Aortic Atherosclerosis (ICD10-I70.0). Electronically Signed   By: Zerita Boers M.D.   On: 08/13/2022 19:30   DG Ribs Unilateral W/Chest Left  Result Date: 08/13/2022 CLINICAL DATA:  Fatigue frequent fall EXAM: LEFT RIBS AND CHEST - 3+ VIEW COMPARISON:  04/24/2022, chest CT 11/17/2018 FINDINGS: Single view chest demonstrates atelectasis or scarring at the lingula. No pleural effusion or pneumothorax. Left rib series demonstrates no acute displaced left rib fracture. Old left fourth through sixth lateral rib fractures. IMPRESSION: No acute displaced left rib fracture. Old left fourth through sixth rib fractures. Negative for pneumothorax or pleural effusion. Atelectasis or scar at the lingula Electronically Signed   By: Donavan Foil M.D.   On: 08/13/2022 18:14    Microbiology: Results for orders placed or performed during the hospital encounter of 08/13/22  Culture, Urine (Do not remove urinary catheter, catheter placed by urology or difficult  to place)     Status: Abnormal   Collection Time: 08/13/22  7:45 PM   Specimen: Urine, Catheterized  Result Value Ref Range Status   Specimen Description   Final    URINE, CATHETERIZED Performed at [facility], [address], [place], [place] [address]    Special Requests   Final    NONE Performed at [facility], [address], [place], [place] [address]    Culture >=100,000 COLONIES/mL PSEUDOMONAS AERUGINOSA (A)  Final   Report Status 08/15/2022 FINAL  Final   Organism ID, Bacteria PSEUDOMONAS AERUGINOSA (A)  Final      Susceptibility   Pseudomonas aeruginosa - MIC*    CEFTAZIDIME 4 SENSITIVE Sensitive     CIPROFLOXACIN 0.5 SENSITIVE Sensitive     GENTAMICIN 4 SENSITIVE Sensitive     IMIPENEM 2 SENSITIVE Sensitive     PIP/TAZO 8 SENSITIVE Sensitive     CEFEPIME 2 SENSITIVE Sensitive     * >=100,000 COLONIES/mL PSEUDOMONAS AERUGINOSA  Blood culture (routine x 2)  Status: None (Preliminary result)   Collection Time: 08/13/22  9:18 PM   Specimen: BLOOD  Result Value Ref Range Status   Specimen Description   Final    BLOOD BLOOD RIGHT FOREARM Performed at [facility], [address] [place], [place] [address]    Special Requests   Final    BOTTLES DRAWN AEROBIC AND ANAEROBIC Blood Culture adequate volume Performed at [facility], [address] [place], [place] [address]    Culture   Final    NO GROWTH 4 DAYS Performed at [facility], [place] [address], [place], [place] [address]    Report Status PENDING  Incomplete  Blood culture (routine x 2)     Status: None (Preliminary result)   Collection Time: 08/13/22 10:54 PM   Specimen: BLOOD  Result Value Ref Range Status   Specimen Description   Final    BLOOD BLOOD RIGHT HAND Performed at [facility], [address] [place], [place] [address]    Special Requests   Final    BOTTLES DRAWN AEROBIC AND ANAEROBIC  Blood Culture adequate volume Performed at [facility], [address] [place], [place] [address]    Culture   Final    NO GROWTH 3 DAYS Performed at [facility], [place] [address], [place], [place] [address]    Report Status PENDING  Incomplete    Labs: CBC: Recent Labs  Lab 08/13/22 1753 08/13/22 1803 08/14/22 0335 08/15/22 0106 08/16/22 0406 08/17/22 1109 08/18/22 0327  WBC 20.4*  --  19.3* 16.6* 17.6* 17.5* 21.5*  NEUTROABS 18.4*  --  17.5*  --   --   --   --   HGB 12.0*   < > 11.8* 11.0* 10.7* 9.7* 9.8*  HCT 35.9*   < > 36.6* 31.9* 31.9* 30.9* 31.4*  MCV 80.1  --  82.8 79.8* 80.6 84.4 85.8  PLT 337  --  311 272 263 234 242   < > = values in this interval not displayed.   Basic Metabolic Panel: Recent Labs  Lab 08/14/22 0335 08/15/22 0106 08/16/22 0406 08/16/22 0830 08/17/22 1109 08/17/22 1110 08/18/22 0327  NA 123* 131* 133*  --  132* 133* 133*  K 5.7* 4.1 3.4*  --  3.1* 3.1* 3.7  CL 94* 98 96*  --  95* 96* 96*  CO2 11* 14* 24  --  25 24 23  $ GLUCOSE 123* 124* 114*  --  147* 146* 146*  BUN 212* 181* 97*  --  78* 78* 70*  CREATININE 8.98* 6.79* 3.64*  --  3.33* 3.26* 3.12*  CALCIUM 7.0* 6.5* 6.7*  --  7.6* 7.7* 8.1*  MG 2.5*  --   --  1.7  --   --   --   PHOS 9.2*  --  4.3  --   --  2.9  --    Liver Function Tests: Recent Labs  Lab 08/13/22 1753 08/14/22 0335 08/17/22 1110  AST 39 38  --   ALT 59* 55*  --   ALKPHOS 96 90  --   BILITOT 0.5 0.7  --   PROT 9.6* 8.5*  --   ALBUMIN 3.6 2.8* 2.3*   CBG: No results for input(s): "GLUCAP" in the last 168 hours.  Discharge time spent: less than 30 minutes.  Signed: Marylu Lund, MD [facility] 08/18/2022

## 2022-08-19 LAB — CULTURE, BLOOD (ROUTINE X 2)
Culture: NO GROWTH
Special Requests: ADEQUATE

## 2022-08-19 MED ORDER — DILTIAZEM HCL ER COATED BEADS 360 MG PO CP24: 360.0000 mg | ORAL_CAPSULE | Freq: Every day | ORAL | 0 refills | Status: DC

## 2022-08-20 ENCOUNTER — Ambulatory Visit: Payer: Medicare Other | Admitting: Vascular Surgery

## 2022-08-20 ENCOUNTER — Ambulatory Visit (HOSPITAL_COMMUNITY)
Admission: RE | Admit: 2022-08-20 | Discharge: 2022-08-20 | Disposition: A | Discharge status: Home / Self Care | Payer: Medicare Other | Source: Ambulatory Visit | Attending: Vascular Surgery | Admitting: Vascular Surgery

## 2022-08-20 ENCOUNTER — Encounter (HOSPITAL_COMMUNITY): Payer: Self-pay

## 2022-08-20 DIAGNOSIS — I872 Venous insufficiency (chronic) (peripheral): Secondary | ICD-10-CM

## 2022-09-07 ENCOUNTER — Other Ambulatory Visit: Payer: Self-pay | Admitting: Cardiology

## 2022-09-07 DIAGNOSIS — I509 Heart failure, unspecified: Secondary | ICD-10-CM

## 2022-09-08 ENCOUNTER — Encounter: Payer: Self-pay | Admitting: Cardiology

## 2022-09-10 ENCOUNTER — Ambulatory Visit: Payer: Medicare Other | Admitting: Internal Medicine

## 2022-10-14 ENCOUNTER — Other Ambulatory Visit: Payer: Self-pay | Admitting: Cardiology

## 2022-11-04 ENCOUNTER — Telehealth: Payer: Self-pay | Admitting: Cardiology

## 2022-11-04 ENCOUNTER — Ambulatory Visit: Payer: Medicare Other | Attending: Nurse Practitioner | Admitting: Nurse Practitioner

## 2022-11-04 ENCOUNTER — Encounter: Payer: Self-pay | Admitting: Nurse Practitioner

## 2022-11-04 VITALS — BP 148/98 | HR 92 | Ht 76.0 in | Wt 308.8 lb

## 2022-11-04 DIAGNOSIS — N184 Chronic kidney disease, stage 4 (severe): Secondary | ICD-10-CM

## 2022-11-04 DIAGNOSIS — I4821 Permanent atrial fibrillation: Secondary | ICD-10-CM | POA: Diagnosis not present

## 2022-11-04 DIAGNOSIS — I340 Nonrheumatic mitral (valve) insufficiency: Secondary | ICD-10-CM | POA: Diagnosis not present

## 2022-11-04 DIAGNOSIS — R0683 Snoring: Secondary | ICD-10-CM | POA: Diagnosis present

## 2022-11-04 DIAGNOSIS — I420 Dilated cardiomyopathy: Secondary | ICD-10-CM | POA: Diagnosis not present

## 2022-11-04 DIAGNOSIS — I5032 Chronic diastolic (congestive) heart failure: Secondary | ICD-10-CM | POA: Diagnosis not present

## 2022-11-04 DIAGNOSIS — I10 Essential (primary) hypertension: Secondary | ICD-10-CM

## 2022-11-04 MED ORDER — CARVEDILOL 25 MG PO TABS: 25.0000 mg | ORAL_TABLET | Freq: Two times a day (BID) | ORAL | 3 refills | Status: DC

## 2022-11-04 MED ORDER — TORSEMIDE 20 MG PO TABS: 40.0000 mg | ORAL_TABLET | Freq: Every day | ORAL | 3 refills | Status: DC

## 2022-11-04 NOTE — Telephone Encounter (Signed)
Patient wife states he is having difficulty breathing.  Worse when lays down.  States spoke with the doctor last week, but seems worse this week.  Struggling to breath and "scares him". O2 at 97%.  He is out of the house running an errand. She is able to get him on call with [place].  Some edema to legs, section of redness to leg for years, nothing new. Heart rate 82-85. BP 168/105 before meds and 148/98 after medication.  Patient is having recognized SOB on the phone. Appt made for today with E. Monge.  Advised if SOB continues and he is in distress in any way then he needs to go to ED immediatly.  Patient and wife state understanding.

## 2022-11-04 NOTE — Telephone Encounter (Signed)
Wife states she is returning a miss called. Please advise

## 2022-11-04 NOTE — Patient Instructions (Signed)
Medication Instructions:  Increase Carvedilol 25 mg twice daily Increase Torsemide 60 mg daily for 3 days, then resume 40 mg daily.  *If you need a refill on your cardiac medications before your next appointment, please call your pharmacy*   Lab Work: NONE ordered at this time of appointment   If you have labs (blood work) drawn today and your tests are completely normal, you will receive your results only by: MyChart Message (if you have MyChart) OR A paper copy in the mail If you have any lab test that is abnormal or we need to change your treatment, we will call you to review the results.   Testing/Procedures: NONE ordered at this time of appointment     Follow-Up: At [facility], you and your health needs are our priority.  As part of our continuing mission to provide you with exceptional heart care, we have created designated Provider Care Teams.  These Care Teams include your primary Cardiologist (physician) and Advanced Practice Providers (APPs -  Physician Assistants and Nurse Practitioners) who all work together to provide you with the care you need, when you need it.  We recommend signing up for the patient portal called "MyChart".  Sign up information is provided on this After Visit Summary.  MyChart is used to connect with patients for Virtual Visits (Telemedicine).  Patients are able to view lab/test results, encounter notes, upcoming appointments, etc.  Non-urgent messages can be sent to your provider as well.   To learn more about what you can do with MyChart, go to [email or web address].    Your next appointment:   1 week(s)  Provider:   Bernadene Person, NP        Other Instructions Monitor Blood Pressure as directed.

## 2022-11-04 NOTE — Progress Notes (Signed)
Office Visit    Patient Name: [name] Date of Encounter: 11/04/2022  Primary Care Provider:  Johny Blamer, MD Primary Cardiologist:  [name], MD  Chief Complaint    67 year old male with a history of permanent atrial fibrillation, cardiomyopathy with improved EF, chronic diastolic heart failure, mild to moderate mitral valve regurgitation, hypertension, hyperlipidemia, CKD stage III-IV, and obesity who presents for follow-up related to heart failure and atrial fibrillation.  Past Medical History    Past Medical History:  Diagnosis Date   Allergic rhinitis    Anxiety    Asthma    as a child   Chronic low back pain    Complication of anesthesia    "hard to put asleep, hard to wake up, nausea and vomiting"   Depression    ED (erectile dysfunction)    GERD (gastroesophageal reflux disease)    HLD (hyperlipidemia)    HTN (hypertension)    sees Dr. Elias Else, [facility]   Hypertrophy of prostate with urinary obstruction and other lower urinary tract symptoms (LUTS)    IBS (irritable bowel syndrome)    Inguinal hernia without mention of obstruction or gangrene, unilateral or unspecified, (not specified as recurrent)    Insomnia    Lumbar herniated disc    L7   Morbid obesity (HCC)    Narcotic addiction (HCC)    NICM (nonischemic cardiomyopathy) (HCC)    tachycardia mediated,  resolved with sinus   Persistent atrial fibrillation (HCC)    ablation done 03/2006 at [facility]   Pneumonia    hx of 2004   PONV (postoperative nausea and vomiting)    Twitching    legs   Past Surgical History:  Procedure Laterality Date   ATRIAL ABLATION SURGERY  2007   [facility]   CARDIOVERSION  08/11/2011   Procedure: CARDIOVERSION;  Surgeon: Lewayne Bunting, MD;  Location: [facility] OR;  Service: Cardiovascular;  Laterality: N/A;   CARDIOVERSION N/A 04/16/2017   Procedure: CARDIOVERSION;  Surgeon: Thurmon Fair, MD;  Location: [facility] ENDOSCOPY;  Service: Cardiovascular;   Laterality: N/A;   CARDIOVERSION N/A 03/04/2019   Procedure: CARDIOVERSION;  Surgeon: Lewayne Bunting, MD;  Location: [facility] ENDOSCOPY;  Service: Cardiovascular;  Laterality: N/A;   CARDIOVERSION N/A 07/04/2019   Procedure: CARDIOVERSION;  Surgeon: Chilton Si, MD;  Location: [facility] ENDOSCOPY;  Service: Cardiovascular;  Laterality: N/A;   EYE SURGERY     lasik, 1998   HERNIA REPAIR     1977   INGUINAL HERNIA REPAIR Right 08/26/2012   Procedure: LAPAROSCOPIC INGUINAL HERNIA;  Surgeon: Lodema Pilot, DO;  Location: [facility] OR;  Service: General;  Laterality: Right;  laparoscopic right inguinal hernia repair with mesh, umbilical hernia repair   INSERTION OF MESH Right 08/26/2012   Procedure: INSERTION OF MESH;  Surgeon: Lodema Pilot, DO;  Location: [facility] OR;  Service: General;  Laterality: Right;  right inguinal hernia   IR FLUORO GUIDE CV LINE RIGHT  08/15/2022   IR US GUIDE VASC ACCESS RIGHT  08/15/2022   TEE WITHOUT CARDIOVERSION N/A 03/04/2019   Procedure: TRANSESOPHAGEAL ECHOCARDIOGRAM (TEE);  Surgeon: Lewayne Bunting, MD;  Location: [facility] ENDOSCOPY;  Service: Cardiovascular;  Laterality: N/A;   UMBILICAL HERNIA REPAIR N/A 08/26/2012   Procedure: HERNIA REPAIR UMBILICAL ADULT;  Surgeon: Lodema Pilot, DO;  Location: [facility] OR;  Service: General;  Laterality: N/A;    Allergies  Allergies  Allergen Reactions   Baclofen Other (See Comments)    Severe dizziness   Doxycycline Hyclate Other (See  Comments)    Flu-like symptoms   Gabapentin Swelling and Other (See Comments)    Swelling in feet   Hycodan [Hydrocodone Bit-Homatrop Mbr] Other (See Comments)    Dizziness    Hydrocodone-Acetaminophen Other (See Comments)    GI upset, Dizziness   Pregabalin Swelling and Other (See Comments)    Severly dry mouth     Labs/Other Studies Reviewed    The following studies were reviewed today: Echo 04/2022: IMPRESSIONS     1. Left ventricular ejection fraction, by estimation, is 60 to 65%. The  left ventricle  has normal function. The left ventricle has no regional  wall motion abnormalities. There is mild left ventricular hypertrophy.  Left ventricular diastolic parameters  are indeterminate.   2. Right ventricular systolic function is normal. The right ventricular  size is normal.   3. Left atrial size was mildly dilated.   4. Mild mitral valve regurgitation.   5. The aortic valve is normal in structure. Aortic valve regurgitation is  not visualized.   Comparison(s): The left ventricular function is unchanged compared to echo  from September 2022.  Recent Labs: 08/13/2022: B Natriuretic Peptide 74.5; TSH 1.144 08/14/2022: ALT 55 08/16/2022: Magnesium 1.7 08/18/2022: BUN 70; Creatinine, Ser 3.12; Hemoglobin 9.8; Platelets 242; Potassium 3.7; Sodium 133  Recent Lipid Panel    Component Value Date/Time   CHOL  02/14/2009 0630    126        ATP III CLASSIFICATION:  <200     mg/dL   Desirable  161-096  mg/dL   Borderline High  >=045    mg/dL   High          TRIG 95 02/14/2009 0630   HDL 48 02/14/2009 0630   CHOLHDL 2.6 02/14/2009 0630   VLDL 19 02/14/2009 0630   LDLCALC  02/14/2009 0630    59        Total Cholesterol/HDL:CHD Risk Coronary Heart Disease Risk Table                     Men   Women  1/2 Average Risk   3.4   3.3  Average Risk       5.0   4.4  2 X Average Risk   9.6   7.1  3 X Average Risk  23.4   11.0        Use the calculated Patient Ratio above and the CHD Risk Table to determine the patient's CHD Risk.        ATP III CLASSIFICATION (LDL):  <100     mg/dL   Optimal  409-811  mg/dL   Near or Above                    Optimal  130-159  mg/dL   Borderline  914-782  mg/dL   High  >956     mg/dL   Very High    History of Present Illness    67 year old male with the above past medical history including permanent atrial fibrillation, cardiomyopathy with improved EF, chronic diastolic heart failure, mild to moderate mitral valve regurgitation, hypertension,  hyperlipidemia, CKD stage III-IV, and obesity.  Cardiac catheterization in 2004 showed EF 45%, normal coronary arteries.  He had atrial fibrillation at the time, LV function improved with restoration of sinus rhythm.  He ultimately went atrial fibrillation ablation.  He did well for several years but then had recurrent atrial fibrillation.  TEE in August 2020 showed normal LV  function, severe left atrial enlargement, moderate to severe mitral valve regurgitation, mild tricuspid valve regurgitation.  He underwent DCCV in 02/2019 with early recurrence of atrial fibrillation.  Repeat DCCV in December 2020 was also unsuccessful.  He was not felt to be a candidate for repeat ablation given severe left atrial enlargement, obesity.  He was evaluated by CT surgery for consideration of surgical maze procedure, medical therapy was ultimately advised.  Echocardiogram in 03/2021 showed normal LV function, mild LVH, mild RV, mild LAE, mild to moderate MR.  He was last seen in the office on 03/26/2022 and was stable overall from a cardiac standpoint.  He did note worsening dyspnea on exertion, chronic pedal edema.  His torsemide was increased to 60 mg twice daily with an additional 20 mg as needed for worsening edema.  Hydralazine was increased to 100 mg 3 times daily.  He was hospitalized in October 2023 in the setting of acute on chronic diastolic heart failure secondary to urinary retention due to enlarged prostate, AKI on CKD.  Urology and nephrology were consulted.  Repeat echocardiogram showed EF 60 to 65%, normal LV function, no RWMA, mild LVH, normal RV systolic function, mildly dilated left atrium, mild mitral valve regurgitation.  He was hospitalized in February 2024 in the setting of AKI on CKD stage IV (baseline creatinine 3.5-4.5), UTI with sepsis, uremia. He was discharged with a foley catheter, with subsequent improvement in his renal function.  His wife contacted our office on 11/04/2022 with complaints of worsening  shortness of breath, orthopnea.  He presents today for follow-up accompanied by his wife.  Since his last visit he notes a 1 week history of increased shortness of breath at both at rest and with activity, but mostly he notes increased orthopnea (interestingly he denies orthopnea with daytime napping). He has ongoing bilateral lower extremity edema (he states this has improved, though significant on exam).  Over the past 2 months he has gained at least 30 pounds.  He also reports dietary indiscretion.  He denies chest pain, palpitations.  Lastly, he notes a 2-week history of non-pruritic rash to his abdomen. Overall, he feels poorly.   Home Medications    Current Outpatient Medications  Medication Sig Dispense Refill   apixaban (ELIQUIS) 5 MG TABS tablet TAKE 1 TABLET BY MOUTH TWICE A DAY 60 tablet 5   carvedilol (COREG) 12.5 MG tablet Take 1 tablet (12.5 mg total) by mouth 2 (two) times daily with a meal. 60 tablet 2   clonazePAM (KLONOPIN) 0.5 MG tablet Take 0.5 mg by mouth at bedtime.     diltiazem (CARDIZEM CD) 360 MG 24 hr capsule TAKE 1 CAPSULE(360 MG) BY MOUTH DAILY 90 capsule 0   DULoxetine (CYMBALTA) 60 MG capsule Take 60 mg by mouth 2 (two) times daily.     rOPINIRole (REQUIP) 1 MG tablet Take 1-2 tablets (1-2 mg total) by mouth at bedtime as needed (restless leg syndrome). 1 tablet after lunch and 2 tablet at bedtime (Patient taking differently: Take 1-4 mg by mouth See admin instructions. Take 1 mg by mouth in the morning and 4 mg at bedtime) 120 tablet 0   tamsulosin (FLOMAX) 0.4 MG CAPS capsule Take 1 capsule (0.4 mg total) by mouth daily after supper. 30 capsule 1   TYLENOL 500 MG tablet Take 1,000 mg by mouth every 6 (six) hours as needed for mild pain or headache.     finasteride (PROSCAR) 5 MG tablet Take 1 tablet (5 mg total) by mouth  daily. (Patient not taking: Reported on 08/14/2022) 30 tablet 0   methocarbamol (ROBAXIN) 750 MG tablet Take 750 mg by mouth every 6 (six) hours as  needed for muscle spasms. (Patient not taking: Reported on 11/04/2022)     omeprazole (PRILOSEC) 20 MG capsule Take 20 mg by mouth daily as needed (for acid reflux). (Patient not taking: Reported on 11/04/2022)     QUEtiapine (SEROQUEL) 25 MG tablet Take 1/2 tablet (12.5 mg total) by mouth at bedtime. (Patient not taking: Reported on 11/04/2022) 20 tablet 0   No current facility-administered medications for this visit.     Review of Systems   He denies chest pain, palpitations, pnd, orthopnea, n, v, dizziness, syncope, weight gain, or early satiety. All other systems reviewed and are otherwise negative except as noted above.   Physical Exam    VS:  BP (!) 148/98   Pulse 92   Ht 6\' 4"  (1.93 m)   Wt (!) 308 lb 12.8 oz (140.1 kg)   SpO2 94%   BMI 37.59 kg/m  STOP-Bang Score:  6      GEN: Well nourished, well developed, in no acute distress. HEENT: normal. Neck: Supple, no JVD, carotid bruits, or masses. Cardiac: IRIR, no murmurs, rubs, or gallops. No clubbing, cyanosis, 1+ pitting bilateral lower extremity edema.  Radials/DP/PT 2+ and equal bilaterally.  Respiratory:  Respirations regular and unlabored, clear to auscultation bilaterally. GI: Obese, soft, nontender, nondistended, BS + x 4. MS: no deformity or atrophy. Skin: warm and dry, rash to abdomen, wounds to bilateral lower extremities. Neuro:  Strength and sensation are intact. Psych: Normal affect.  Accessory Clinical Findings    ECG personally reviewed by me today -atrial fibrillation 88 bpm- no acute changes.   Lab Results  Component Value Date   WBC 21.5 (H) 08/18/2022   HGB 9.8 (L) 08/18/2022   HCT 31.4 (L) 08/18/2022   MCV 85.8 08/18/2022   PLT 242 08/18/2022   Lab Results  Component Value Date   CREATININE 3.12 (H) 08/18/2022   BUN 70 (H) 08/18/2022   NA 133 (L) 08/18/2022   K 3.7 08/18/2022   CL 96 (L) 08/18/2022   CO2 23 08/18/2022   Lab Results  Component Value Date   ALT 55 (H) 08/14/2022   AST 38  08/14/2022   ALKPHOS 90 08/14/2022   BILITOT 0.7 08/14/2022   Lab Results  Component Value Date   CHOL  02/14/2009    126        ATP III CLASSIFICATION:  <200     mg/dL   Desirable  696-295  mg/dL   Borderline High  >=284    mg/dL   High          HDL 48 02/14/2009   LDLCALC  02/14/2009    59        Total Cholesterol/HDL:CHD Risk Coronary Heart Disease Risk Table                     Men   Women  1/2 Average Risk   3.4   3.3  Average Risk       5.0   4.4  2 X Average Risk   9.6   7.1  3 X Average Risk  23.4   11.0        Use the calculated Patient Ratio above and the CHD Risk Table to determine the patient's CHD Risk.        ATP III CLASSIFICATION (LDL):  <  100     mg/dL   Optimal  161-096  mg/dL   Near or Above                    Optimal  130-159  mg/dL   Borderline  045-409  mg/dL   High  >811     mg/dL   Very High   TRIG 95 91/47/8295   CHOLHDL 2.6 02/14/2009    Lab Results  Component Value Date   HGBA1C 6.5 (H) 04/19/2021    Assessment & Plan    1. Chronic diastolic heart failure/cardiomyopathy: Prior EF 45%. Cath at the time showed normal coronaries. Most recent echo in 04/2022 showed EF 60 to 65%, normal LV function, no RWMA, mild LVH, normal RV systolic function, mildly dilated left atrium, mild mitral valve regurgitation.  Recent dyspnea, orthopnea, 30 pound weight gain over the past 2 months.  He has significant bilateral lower extremity edema, with chronic wounds to his bilateral lower extremities.  Reports dietary indiscretion.  He notes increased feelings of depression as he is unable to participate in any activities that he wants enjoyed due to his symptoms.  He is adamant that he does not want to go to the ED.  Patient assessed with Dr. [name].  Will increase torsemide to 60 mg daily x 3 days followed by torsemide 40 mg daily.  Will have him return in 1 week for close follow-up and plan for repeat labs at that time.  Discussed daily weights, sodium and fluid  recommendations.  Discussed ED precautions.  2. Permanent atrial fibrillation: Rate controlled. Continue diltiazem, Eliquis.   3. Mitral valve regurgitation: Mild on most recent echo.   4. Hypertension: Carvedilol was decreased to 12.5 mg twice daily during hospitalization in February 2024.  BP has been elevated since.  Will increase carvedilol to 25 mg twice daily (HR stable).     5. CKD stage III-IV: Creatinine rose to 10.08 during hospitalization in 08/2022 (likely exacerbated by acute urinary retention, bladder outlet obstruction). Renal function improved significantly with placement of Foley catheter.  Creatinine was 2.09 in 10/10/2022. Following with urology and nephrology.  Continue to monitor renal function closely with increased diuretic as above.  6. Obesity: Activity has been limited in the setting of acute on chronic heart failure, multiple hospitalizations. Encouraged increased activity as tolerated.   7. History of snoring: Previously referred for sleep study, this was never completed. He declines sleep study at this time.   8. Rash: He notes a 2-week history of a nonpruritic rash to his abdomen.  Recommend follow-up with PCP.  9. Disposition: Follow-up in 1 week.    Joylene Grapes, NP 11/04/2022, 3:07 PM

## 2022-11-12 ENCOUNTER — Ambulatory Visit: Payer: Medicare Other | Attending: Nurse Practitioner | Admitting: Nurse Practitioner

## 2022-11-12 VITALS — BP 142/94 | HR 97 | Ht 76.0 in | Wt 292.1 lb

## 2022-11-12 DIAGNOSIS — I5032 Chronic diastolic (congestive) heart failure: Secondary | ICD-10-CM

## 2022-11-12 DIAGNOSIS — I10 Essential (primary) hypertension: Secondary | ICD-10-CM | POA: Diagnosis present

## 2022-11-12 DIAGNOSIS — R0683 Snoring: Secondary | ICD-10-CM | POA: Diagnosis present

## 2022-11-12 DIAGNOSIS — I420 Dilated cardiomyopathy: Secondary | ICD-10-CM | POA: Diagnosis not present

## 2022-11-12 DIAGNOSIS — N184 Chronic kidney disease, stage 4 (severe): Secondary | ICD-10-CM | POA: Diagnosis present

## 2022-11-12 DIAGNOSIS — I340 Nonrheumatic mitral (valve) insufficiency: Secondary | ICD-10-CM

## 2022-11-12 DIAGNOSIS — I4821 Permanent atrial fibrillation: Secondary | ICD-10-CM | POA: Diagnosis not present

## 2022-11-12 NOTE — Progress Notes (Signed)
Office Visit    Patient Name: [name] Date of Encounter: 11/12/2022  Primary Care Provider:  Johny Blamer, MD Primary Cardiologist:  [name], MD  Chief Complaint    67 year old male with a history of permanent atrial fibrillation, cardiomyopathy with improved EF, chronic diastolic heart failure, mild to moderate mitral valve regurgitation, hypertension, hyperlipidemia, CKD stage III-IV, and obesity who presents for follow-up related to heart failure and atrial fibrillation.   Past Medical History    Past Medical History:  Diagnosis Date   Allergic rhinitis    Anxiety    Asthma    as a child   Chronic low back pain    Complication of anesthesia    "hard to put asleep, hard to wake up, nausea and vomiting"   Depression    ED (erectile dysfunction)    GERD (gastroesophageal reflux disease)    HLD (hyperlipidemia)    HTN (hypertension)    sees Dr. Elias Else, [facility]   Hypertrophy of prostate with urinary obstruction and other lower urinary tract symptoms (LUTS)    IBS (irritable bowel syndrome)    Inguinal hernia without mention of obstruction or gangrene, unilateral or unspecified, (not specified as recurrent)    Insomnia    Lumbar herniated disc    L7   Morbid obesity (HCC)    Narcotic addiction (HCC)    NICM (nonischemic cardiomyopathy) (HCC)    tachycardia mediated,  resolved with sinus   Persistent atrial fibrillation (HCC)    ablation done 03/2006 at [facility]   Pneumonia    hx of 2004   PONV (postoperative nausea and vomiting)    Twitching    legs   Past Surgical History:  Procedure Laterality Date   ATRIAL ABLATION SURGERY  2007   [facility]   CARDIOVERSION  08/11/2011   Procedure: CARDIOVERSION;  Surgeon: Lewayne Bunting, MD;  Location: [facility] OR;  Service: Cardiovascular;  Laterality: N/A;   CARDIOVERSION N/A 04/16/2017   Procedure: CARDIOVERSION;  Surgeon: Thurmon Fair, MD;  Location: [facility] ENDOSCOPY;  Service: Cardiovascular;   Laterality: N/A;   CARDIOVERSION N/A 03/04/2019   Procedure: CARDIOVERSION;  Surgeon: Lewayne Bunting, MD;  Location: [facility] ENDOSCOPY;  Service: Cardiovascular;  Laterality: N/A;   CARDIOVERSION N/A 07/04/2019   Procedure: CARDIOVERSION;  Surgeon: Chilton Si, MD;  Location: [facility] ENDOSCOPY;  Service: Cardiovascular;  Laterality: N/A;   EYE SURGERY     lasik, 1998   HERNIA REPAIR     1977   INGUINAL HERNIA REPAIR Right 08/26/2012   Procedure: LAPAROSCOPIC INGUINAL HERNIA;  Surgeon: Lodema Pilot, DO;  Location: [facility] OR;  Service: General;  Laterality: Right;  laparoscopic right inguinal hernia repair with mesh, umbilical hernia repair   INSERTION OF MESH Right 08/26/2012   Procedure: INSERTION OF MESH;  Surgeon: Lodema Pilot, DO;  Location: [facility] OR;  Service: General;  Laterality: Right;  right inguinal hernia   IR FLUORO GUIDE CV LINE RIGHT  08/15/2022   IR US GUIDE VASC ACCESS RIGHT  08/15/2022   TEE WITHOUT CARDIOVERSION N/A 03/04/2019   Procedure: TRANSESOPHAGEAL ECHOCARDIOGRAM (TEE);  Surgeon: Lewayne Bunting, MD;  Location: [facility] ENDOSCOPY;  Service: Cardiovascular;  Laterality: N/A;   UMBILICAL HERNIA REPAIR N/A 08/26/2012   Procedure: HERNIA REPAIR UMBILICAL ADULT;  Surgeon: Lodema Pilot, DO;  Location: [facility] OR;  Service: General;  Laterality: N/A;    Allergies  Allergies  Allergen Reactions   Baclofen Other (See Comments)    Severe dizziness   Doxycycline Hyclate Other (  See Comments)    Flu-like symptoms   Gabapentin Swelling and Other (See Comments)    Swelling in feet   Hycodan [Hydrocodone Bit-Homatrop Mbr] Other (See Comments)    Dizziness    Hydrocodone-Acetaminophen Other (See Comments)    GI upset, Dizziness   Pregabalin Swelling and Other (See Comments)    Severly dry mouth     Labs/Other Studies Reviewed    The following studies were reviewed today: Echo 04/2022: IMPRESSIONS     1. Left ventricular ejection fraction, by estimation, is 60 to 65%. The  left ventricle  has normal function. The left ventricle has no regional  wall motion abnormalities. There is mild left ventricular hypertrophy.  Left ventricular diastolic parameters  are indeterminate.   2. Right ventricular systolic function is normal. The right ventricular  size is normal.   3. Left atrial size was mildly dilated.   4. Mild mitral valve regurgitation.   5. The aortic valve is normal in structure. Aortic valve regurgitation is  not visualized.   Comparison(s): The left ventricular function is unchanged compared to echo  from September 2022.   Recent Labs: 08/13/2022: B Natriuretic Peptide 74.5; TSH 1.144 08/14/2022: ALT 55 08/16/2022: Magnesium 1.7 08/18/2022: BUN 70; Creatinine, Ser 3.12; Hemoglobin 9.8; Platelets 242; Potassium 3.7; Sodium 133  Recent Lipid Panel    Component Value Date/Time   CHOL  02/14/2009 0630    126        ATP III CLASSIFICATION:  <200     mg/dL   Desirable  213-086  mg/dL   Borderline High  >=578    mg/dL   High          TRIG 95 02/14/2009 0630   HDL 48 02/14/2009 0630   CHOLHDL 2.6 02/14/2009 0630   VLDL 19 02/14/2009 0630   LDLCALC  02/14/2009 0630    59        Total Cholesterol/HDL:CHD Risk Coronary Heart Disease Risk Table                     Men   Women  1/2 Average Risk   3.4   3.3  Average Risk       5.0   4.4  2 X Average Risk   9.6   7.1  3 X Average Risk  23.4   11.0        Use the calculated Patient Ratio above and the CHD Risk Table to determine the patient's CHD Risk.        ATP III CLASSIFICATION (LDL):  <100     mg/dL   Optimal  469-629  mg/dL   Near or Above                    Optimal  130-159  mg/dL   Borderline  528-413  mg/dL   High  >244     mg/dL   Very High    History of Present Illness    67 year old male with the above past medical history including permanent atrial fibrillation, cardiomyopathy with improved EF, chronic diastolic heart failure, mild to moderate mitral valve regurgitation, hypertension,  hyperlipidemia, CKD stage III-IV, and obesity.   Cardiac catheterization in 2004 showed EF 45%, normal coronary arteries.  He had atrial fibrillation at the time, LV function improved with restoration of sinus rhythm.  He ultimately went atrial fibrillation ablation.  He did well for several years but then had recurrent atrial fibrillation.  TEE in August 2020  showed normal LV function, severe left atrial enlargement, moderate to severe mitral valve regurgitation, mild tricuspid valve regurgitation.  He underwent DCCV in 02/2019 with early recurrence of atrial fibrillation.  Repeat DCCV in December 2020 was also unsuccessful.  He was not felt to be a candidate for repeat ablation given severe left atrial enlargement, obesity.  He was evaluated by CT surgery for consideration of surgical maze procedure, medical therapy was ultimately advised.  Echocardiogram in 03/2021 showed normal LV function, mild LVH, mild RV, mild LAE, mild to moderate MR. At his follow-up visit in 03/2022 he noted worsening dyspnea on exertion, chronic pedal edema.  His torsemide was increased to 60 mg twice daily with an additional 20 mg as needed for worsening edema.  Hydralazine was increased to 100 mg 3 times daily.  He was hospitalized in October 2023 in the setting of acute on chronic diastolic heart failure secondary to urinary retention due to enlarged prostate, AKI on CKD.  Urology and nephrology were consulted.  Repeat echocardiogram showed EF 60 to 65%, normal LV function, no RWMA, mild LVH, normal RV systolic function, mildly dilated left atrium, mild mitral valve regurgitation.  He was hospitalized in February 2024 in the setting of AKI on CKD stage IV (baseline creatinine 3.5-4.5), UTI with sepsis, uremia. He was discharged with a foley catheter, with subsequent improvement in his renal function.  His wife contacted our office on 11/04/2022 with complaints of worsening shortness of breath, orthopnea.  He was last seen in the office  on 11/04/2022 and noted a 1 week history of increased shortness of breath both at rest and with activity, increased orthopnea, ongoing bilateral lower extremity edema, weight gain of 30 pounds over the past 2 months.  Torsemide was increased to 60 mg daily x 3 days followed by torsemide 40 mg daily with plans for close follow-up.  He also noted a 2-week history of a nonpruritic rash to his abdomen.   He presents today for follow-up accompanied by his wife.  Since his last visit he has done well from a cardiac standpoint.  His weight is down 16 pounds in a week.  His swelling has improved dramatically.  His breathing has also improved.  He has been recording his weight and blood pressure daily. BP is slightly elevated in office today though he notes he has not taken his medication today -has been well controlled at home. He was given prednisone by his PCP for his abdominal rash and he has also seen improvement in his rash. Overall, he reports feeling much better.    Home Medications    Current Outpatient Medications  Medication Sig Dispense Refill   apixaban (ELIQUIS) 5 MG TABS tablet TAKE 1 TABLET BY MOUTH TWICE A DAY 60 tablet 5   carvedilol (COREG) 25 MG tablet Take 1 tablet (25 mg total) by mouth 2 (two) times daily. 180 tablet 3   clonazePAM (KLONOPIN) 0.5 MG tablet Take 0.5 mg by mouth at bedtime.     diltiazem (CARDIZEM CD) 360 MG 24 hr capsule TAKE 1 CAPSULE(360 MG) BY MOUTH DAILY 90 capsule 0   DULoxetine (CYMBALTA) 60 MG capsule Take 60 mg by mouth 2 (two) times daily.     omeprazole (PRILOSEC) 20 MG capsule Take 20 mg by mouth daily as needed (for acid reflux).     rOPINIRole (REQUIP) 1 MG tablet Take 1-2 tablets (1-2 mg total) by mouth at bedtime as needed (restless leg syndrome). 1 tablet after lunch and 2 tablet at  bedtime 120 tablet 0   torsemide (DEMADEX) 20 MG tablet Take 2 tablets (40 mg total) by mouth daily. (Patient taking differently: Take 40 mg by mouth daily. Take 60 mg for 3  days, then take 40 mg daily) 180 tablet 3   TYLENOL 500 MG tablet Take 1,000 mg by mouth every 6 (six) hours as needed for mild pain or headache.     finasteride (PROSCAR) 5 MG tablet Take 1 tablet (5 mg total) by mouth daily. (Patient not taking: Reported on 11/12/2022) 30 tablet 0   methocarbamol (ROBAXIN) 750 MG tablet Take 750 mg by mouth every 6 (six) hours as needed for muscle spasms. (Patient not taking: Reported on 11/04/2022)     QUEtiapine (SEROQUEL) 25 MG tablet Take 1/2 tablet (12.5 mg total) by mouth at bedtime. (Patient not taking: Reported on 11/04/2022) 20 tablet 0   tamsulosin (FLOMAX) 0.4 MG CAPS capsule Take 1 capsule (0.4 mg total) by mouth daily after supper. (Patient not taking: Reported on 11/12/2022) 30 capsule 1   No current facility-administered medications for this visit.     Review of Systems    He denies chest pain, palpitations, dyspnea, pnd, orthopnea, n, v, dizziness, syncope, weight gain, or early satiety. All other systems reviewed and are otherwise negative except as noted above.   Physical Exam    VS:  BP (!) 142/94   Pulse 97   Ht 6\' 4"  (1.93 m)   Wt 292 lb 1.6 oz (132.5 kg)   SpO2 96%   BMI 35.56 kg/m   STOP-Bang Score:  6      GEN: Well nourished, well developed, in no acute distress. HEENT: normal. Neck: Supple, no JVD, carotid bruits, or masses. Cardiac: RRR, no murmurs, rubs, or gallops. No clubbing, cyanosis, nonpitting bilateral lower extremity edema.  Radials/DP/PT 2+ and equal bilaterally.  Respiratory:  Respirations regular and unlabored, clear to auscultation bilaterally. GI: Soft, nontender, nondistended, BS + x 4. MS: no deformity or atrophy. Skin: warm and dry, no rash. Neuro:  Strength and sensation are intact. Psych: Normal affect.  Accessory Clinical Findings    ECG personally reviewed by me today - No EKG in office today.    Lab Results  Component Value Date   WBC 21.5 (H) 08/18/2022   HGB 9.8 (L) 08/18/2022   HCT 31.4 (L)  08/18/2022   MCV 85.8 08/18/2022   PLT 242 08/18/2022   Lab Results  Component Value Date   CREATININE 3.12 (H) 08/18/2022   BUN 70 (H) 08/18/2022   NA 133 (L) 08/18/2022   K 3.7 08/18/2022   CL 96 (L) 08/18/2022   CO2 23 08/18/2022   Lab Results  Component Value Date   ALT 55 (H) 08/14/2022   AST 38 08/14/2022   ALKPHOS 90 08/14/2022   BILITOT 0.7 08/14/2022   Lab Results  Component Value Date   CHOL  02/14/2009    126        ATP III CLASSIFICATION:  <200     mg/dL   Desirable  147-829  mg/dL   Borderline High  >=562    mg/dL   High          HDL 48 02/14/2009   LDLCALC  02/14/2009    59        Total Cholesterol/HDL:CHD Risk Coronary Heart Disease Risk Table                     Men   Women  1/2  Average Risk   3.4   3.3  Average Risk       5.0   4.4  2 X Average Risk   9.6   7.1  3 X Average Risk  23.4   11.0        Use the calculated Patient Ratio above and the CHD Risk Table to determine the patient's CHD Risk.        ATP III CLASSIFICATION (LDL):  <100     mg/dL   Optimal  782-956  mg/dL   Near or Above                    Optimal  130-159  mg/dL   Borderline  213-086  mg/dL   High  >578     mg/dL   Very High   TRIG 95 46/96/2952   CHOLHDL 2.6 02/14/2009    Lab Results  Component Value Date   HGBA1C 6.5 (H) 04/19/2021    Assessment & Plan    1. Chronic diastolic heart failure/cardiomyopathy: Prior EF 45%. Cath at the time showed normal coronaries. Most recent echo in 04/2022 showed EF 60 to 65%, normal LV function, no RWMA, mild LVH, normal RV systolic function, mildly dilated left atrium, mild mitral valve regurgitation.  Recent dyspnea, orthopnea, 30 pound weight gain over the past 2 months.  Symptoms have improved dramatically with the increase in torsemide, adherence to sodium and fluid recommendations.  His weight is down 16 pounds, his breathing has improved, he no longer has pitting edema.  Will check BMET today.  I advised him to take an  additional 20 mg of torsemide daily as needed for swelling, weight gain, increased shortness of breath.  Will once again plan for close follow-up to encourage adherence.  Reinforced daily weights, sodium and fluid recommendations.  Continue carvedilol, torsemide as above.  2. Permanent atrial fibrillation: Rate controlled. Continue diltiazem, carvedilol, Eliquis.    3. Mitral valve regurgitation: Mild on most recent echo.    4. Hypertension: BP is elevated in office today, however, patient has not had his medication yet today.  BP has been well-controlled at home as seen on BP log.  Continue current antihypertensive regimen.   5. CKD stage III-IV: Creatinine rose to 10.08 during hospitalization in 08/2022 (likely exacerbated by acute urinary retention, bladder outlet obstruction). Renal function improved significantly with placement of Foley catheter.  Creatinine was 2.09 in 10/10/2022. Following with urology and nephrology.  Repeat BMET pending.  Continue to monitor renal function closely with increased diuretic as above.   6. Obesity: Activity has been limited in the setting of acute on chronic heart failure, multiple hospitalizations. Encouraged increased activity as tolerated.    7. History of snoring: Previously referred for sleep study, this was never completed. He declines sleep study at this time.    8. Rash: Improved with oral prednisone, managed per PCP.   9. Disposition: Follow-up in 2 weeks with APP, 2 to 3 months with Dr. [name].  Joylene Grapes, NP 11/12/2022, 2:04 PM

## 2022-11-12 NOTE — Patient Instructions (Signed)
Medication Instructions:  Torsemide 40 mg daily. May take an additional 20 mg on the days of swelling/weight gain or shortness of breath.  *If you need a refill on your cardiac medications before your next appointment, please call your pharmacy*   Lab Work: BMET today.  If you have labs (blood work) drawn today and your tests are completely normal, you will receive your results only by: MyChart Message (if you have MyChart) OR A paper copy in the mail If you have any lab test that is abnormal or we need to change your treatment, we will call you to review the results.   Testing/Procedures: NONE ordered at this time of appointment     Follow-Up: At [facility], you and your health needs are our priority.  As part of our continuing mission to provide you with exceptional heart care, we have created designated Provider Care Teams.  These Care Teams include your primary Cardiologist (physician) and Advanced Practice Providers (APPs -  Physician Assistants and Nurse Practitioners) who all work together to provide you with the care you need, when you need it.  We recommend signing up for the patient portal called "MyChart".  Sign up information is provided on this After Visit Summary.  MyChart is used to connect with patients for Virtual Visits (Telemedicine).  Patients are able to view lab/test results, encounter notes, upcoming appointments, etc.  Non-urgent messages can be sent to your provider as well.   To learn more about what you can do with MyChart, go to [email or web address].    Your next appointment:   2 week(s) [facility]) July (Dr. [name])  Provider:   Bernadene Person, NP        Other Instructions

## 2022-11-13 LAB — BASIC METABOLIC PANEL
BUN/Creatinine Ratio: 20 (ref 10–24)
BUN: 34 mg/dL — ABNORMAL HIGH (ref 8–27)
CO2: 20 mmol/L (ref 20–29)
Calcium: 9.3 mg/dL (ref 8.6–10.2)
Chloride: 105 mmol/L (ref 96–106)
Creatinine, Ser: 1.74 mg/dL — ABNORMAL HIGH (ref 0.76–1.27)
Glucose: 102 mg/dL — ABNORMAL HIGH (ref 70–99)
Potassium: 4.5 mmol/L (ref 3.5–5.2)
Sodium: 139 mmol/L (ref 134–144)
eGFR: 43 mL/min/{1.73_m2} — ABNORMAL LOW (ref >59)

## 2022-11-14 ENCOUNTER — Encounter: Payer: Self-pay | Admitting: Nurse Practitioner

## 2022-11-17 ENCOUNTER — Telehealth: Payer: Self-pay

## 2022-11-17 NOTE — Telephone Encounter (Signed)
Spoke with pts spouse. She was notified of pts lab results. Pt will continue current medication and f/u as planned.

## 2022-11-24 ENCOUNTER — Other Ambulatory Visit: Payer: Self-pay | Admitting: Cardiology

## 2022-11-24 DIAGNOSIS — I4819 Other persistent atrial fibrillation: Secondary | ICD-10-CM

## 2022-11-25 NOTE — Telephone Encounter (Signed)
Eliquis 5mg  refill request received. Patient is 67 years old, weight-132.5kg, Crea-1.74 on 11/12/22, Diagnosis-Afib, and last seen by Bernadene Person on 11/12/22. Dose is appropriate based on dosing criteria. Will send in refill to requested pharmacy.

## 2022-11-28 ENCOUNTER — Ambulatory Visit: Payer: Medicare Other | Attending: Nurse Practitioner | Admitting: Nurse Practitioner

## 2022-11-28 ENCOUNTER — Encounter: Payer: Self-pay | Admitting: Nurse Practitioner

## 2022-11-28 VITALS — BP 130/78 | HR 77 | Ht 76.0 in | Wt 303.0 lb

## 2022-11-28 DIAGNOSIS — N184 Chronic kidney disease, stage 4 (severe): Secondary | ICD-10-CM | POA: Insufficient documentation

## 2022-11-28 DIAGNOSIS — I340 Nonrheumatic mitral (valve) insufficiency: Secondary | ICD-10-CM | POA: Diagnosis present

## 2022-11-28 DIAGNOSIS — R0683 Snoring: Secondary | ICD-10-CM | POA: Insufficient documentation

## 2022-11-28 DIAGNOSIS — I420 Dilated cardiomyopathy: Secondary | ICD-10-CM | POA: Insufficient documentation

## 2022-11-28 DIAGNOSIS — I4821 Permanent atrial fibrillation: Secondary | ICD-10-CM

## 2022-11-28 DIAGNOSIS — I5032 Chronic diastolic (congestive) heart failure: Secondary | ICD-10-CM | POA: Diagnosis present

## 2022-11-28 DIAGNOSIS — I10 Essential (primary) hypertension: Secondary | ICD-10-CM | POA: Insufficient documentation

## 2022-11-28 NOTE — Progress Notes (Signed)
Office Visit    Patient Name: [name] Date of Encounter: 11/28/2022  Primary Care Provider:  Johny Blamer, MD Primary Cardiologist:  [name], MD  Chief Complaint    67 year old male with a history of permanent atrial fibrillation, cardiomyopathy with improved EF, chronic diastolic heart failure, mild to moderate mitral valve regurgitation, hypertension, hyperlipidemia, CKD stage III-IV, and obesity who presents for follow-up related to heart failure.   Past Medical History    Past Medical History:  Diagnosis Date   Allergic rhinitis    Anxiety    Asthma    as a child   Chronic low back pain    Complication of anesthesia    "hard to put asleep, hard to wake up, nausea and vomiting"   Depression    ED (erectile dysfunction)    GERD (gastroesophageal reflux disease)    HLD (hyperlipidemia)    HTN (hypertension)    sees Dr. Elias Else, [facility]   Hypertrophy of prostate with urinary obstruction and other lower urinary tract symptoms (LUTS)    IBS (irritable bowel syndrome)    Inguinal hernia without mention of obstruction or gangrene, unilateral or unspecified, (not specified as recurrent)    Insomnia    Lumbar herniated disc    L7   Morbid obesity (HCC)    Narcotic addiction (HCC)    NICM (nonischemic cardiomyopathy) (HCC)    tachycardia mediated,  resolved with sinus   Persistent atrial fibrillation (HCC)    ablation done 03/2006 at [facility]   Pneumonia    hx of 2004   PONV (postoperative nausea and vomiting)    Twitching    legs   Past Surgical History:  Procedure Laterality Date   ATRIAL ABLATION SURGERY  2007   [facility]   CARDIOVERSION  08/11/2011   Procedure: CARDIOVERSION;  Surgeon: Lewayne Bunting, MD;  Location: [facility] OR;  Service: Cardiovascular;  Laterality: N/A;   CARDIOVERSION N/A 04/16/2017   Procedure: CARDIOVERSION;  Surgeon: Thurmon Fair, MD;  Location: [facility] ENDOSCOPY;  Service: Cardiovascular;  Laterality: N/A;    CARDIOVERSION N/A 03/04/2019   Procedure: CARDIOVERSION;  Surgeon: Lewayne Bunting, MD;  Location: [facility] ENDOSCOPY;  Service: Cardiovascular;  Laterality: N/A;   CARDIOVERSION N/A 07/04/2019   Procedure: CARDIOVERSION;  Surgeon: Chilton Si, MD;  Location: [facility] ENDOSCOPY;  Service: Cardiovascular;  Laterality: N/A;   EYE SURGERY     lasik, 1998   HERNIA REPAIR     1977   INGUINAL HERNIA REPAIR Right 08/26/2012   Procedure: LAPAROSCOPIC INGUINAL HERNIA;  Surgeon: Lodema Pilot, DO;  Location: [facility] OR;  Service: General;  Laterality: Right;  laparoscopic right inguinal hernia repair with mesh, umbilical hernia repair   INSERTION OF MESH Right 08/26/2012   Procedure: INSERTION OF MESH;  Surgeon: Lodema Pilot, DO;  Location: [facility] OR;  Service: General;  Laterality: Right;  right inguinal hernia   IR FLUORO GUIDE CV LINE RIGHT  08/15/2022   IR US GUIDE VASC ACCESS RIGHT  08/15/2022   TEE WITHOUT CARDIOVERSION N/A 03/04/2019   Procedure: TRANSESOPHAGEAL ECHOCARDIOGRAM (TEE);  Surgeon: Lewayne Bunting, MD;  Location: [facility] ENDOSCOPY;  Service: Cardiovascular;  Laterality: N/A;   UMBILICAL HERNIA REPAIR N/A 08/26/2012   Procedure: HERNIA REPAIR UMBILICAL ADULT;  Surgeon: Lodema Pilot, DO;  Location: [facility] OR;  Service: General;  Laterality: N/A;    Allergies  Allergies  Allergen Reactions   Baclofen Other (See Comments)    Severe dizziness   Doxycycline Hyclate Other (See Comments)  Flu-like symptoms   Gabapentin Swelling and Other (See Comments)    Swelling in feet   Hycodan [Hydrocodone Bit-Homatrop Mbr] Other (See Comments)    Dizziness    Hydrocodone-Acetaminophen Other (See Comments)    GI upset, Dizziness   Pregabalin Swelling and Other (See Comments)    Severly dry mouth     Labs/Other Studies Reviewed    The following studies were reviewed today: Cardiac Studies & Procedures       ECHOCARDIOGRAM  ECHOCARDIOGRAM COMPLETE 04/25/2022  Narrative ECHOCARDIOGRAM REPORT    Patient  Name:   [name] Date of Exam: 04/25/2022 Medical Rec #:  [id number]         Height:       76.0 in Accession #:    [id number]        Weight:       350.1 lb Date of Birth:  1955/08/28        BSA:          2.810 m Patient Age:    65 years          BP:           169/102 mmHg Patient Gender: M                 HR:           90 bpm. Exam Location:  Inpatient  Procedure: 2D Echo, Color Doppler and Cardiac Doppler  Indications:    CHF- Acute Diastolic  History:        Patient has prior history of Echocardiogram examinations, most recent 04/02/2021. Arrythmias:Atrial Fibrillation; Risk Factors:Hypertension and Dyslipidemia.  Referring Phys: [id number] CAROLE N HALL  IMPRESSIONS   1. Left ventricular ejection fraction, by estimation, is 60 to 65%. The left ventricle has normal function. The left ventricle has no regional wall motion abnormalities. There is mild left ventricular hypertrophy. Left ventricular diastolic parameters are indeterminate. 2. Right ventricular systolic function is normal. The right ventricular size is normal. 3. Left atrial size was mildly dilated. 4. Mild mitral valve regurgitation. 5. The aortic valve is normal in structure. Aortic valve regurgitation is not visualized.  Comparison(s): The left ventricular function is unchanged compared to echo from September 2022.Marland Kitchen  FINDINGS Left Ventricle: Left ventricular ejection fraction, by estimation, is 60 to 65%. The left ventricle has normal function. The left ventricle has no regional wall motion abnormalities. The left ventricular internal cavity size was normal in size. There is mild left ventricular hypertrophy. Left ventricular diastolic parameters are indeterminate.  Right Ventricle: The right ventricular size is normal. Right vetricular wall thickness was not assessed. Right ventricular systolic function is normal.  Left Atrium: Left atrial size was mildly dilated.  Right Atrium: Right atrial size was normal  in size.  Pericardium: Trivial pericardial effusion is present.  Mitral Valve: There is mild thickening of the mitral valve leaflet(s). Mild mitral valve regurgitation.  Tricuspid Valve: The tricuspid valve is normal in structure. Tricuspid valve regurgitation is trivial.  Aortic Valve: The aortic valve is normal in structure. Aortic valve regurgitation is not visualized. Aortic valve mean gradient measures 4.0 mmHg. Aortic valve peak gradient measures 7.3 mmHg. Aortic valve area, by VTI measures 2.93 cm.  Pulmonic Valve: The pulmonic valve was normal in structure. Pulmonic valve regurgitation is not visualized.  Aorta: The aortic root and ascending aorta are structurally normal, with no evidence of dilitation.  IAS/Shunts: The interatrial septum was not assessed.   LEFT VENTRICLE PLAX 2D LVIDd:  5.40 cm LVIDs:         3.70 cm LV PW:         1.20 cm LV IVS:        1.30 cm LVOT diam:     2.30 cm LV SV:         72 LV SV Index:   26 LVOT Area:     4.15 cm   RIGHT VENTRICLE TAPSE (M-mode): 1.8 cm  LEFT ATRIUM              Index        RIGHT ATRIUM           Index LA diam:        6.00 cm  2.13 cm/m   RA Area:     18.60 cm LA Vol (A2C):   124.5 ml 44.30 ml/m  RA Volume:   45.70 ml  16.26 ml/m LA Vol (A4C):   109.0 ml 38.79 ml/m LA Biplane Vol: 106.0 ml 37.72 ml/m AORTIC VALVE AV Area (Vmax):    3.23 cm AV Area (Vmean):   3.03 cm AV Area (VTI):     2.93 cm AV Vmax:           135.00 cm/s AV Vmean:          95.600 cm/s AV VTI:            0.247 m AV Peak Grad:      7.3 mmHg AV Mean Grad:      4.0 mmHg LVOT Vmax:         105.00 cm/s LVOT Vmean:        69.700 cm/s LVOT VTI:          0.174 m LVOT/AV VTI ratio: 0.70  AORTA Ao Root diam: 3.40 cm Ao Asc diam:  3.80 cm  MR Peak grad: 76.2 mmHg MR Vmax:      436.50 cm/s SHUNTS Systemic VTI:  0.17 m Systemic Diam: 2.30 cm  Dietrich Pates MD Electronically signed by Dietrich Pates MD Signature Date/Time:  04/25/2022/10:56:14 AM    Final   TEE  ECHO TEE 03/04/2019  Narrative TRANSESOPHOGEAL ECHO REPORT    Patient Name:   BROOX SERA Date of Exam: 03/04/2019 Medical Rec #:  [id number]         Height:       77.0 in Accession #:    [id number]        Weight:       329.0 lb Date of Birth:  10-30-1955        BSA:          2.76 m Patient Age:    62 years          BP:           144/101 mmHg Patient Gender: M                 HR:           123 bpm. Exam Location:  Inpatient   Procedure: Transesophageal Echo, Cardiac Doppler and Color Doppler  Indications:     Afib  History:         Patient has prior history of Echocardiogram examinations, most recent 03/31/2017. Atrial Fibrillation Risk Factors: Hypertension, Obesity and Sleep Apnea.  Sonographer:     Lavenia Atlas Referring Phys:  [id number] BRIAN S CRENSHAW Diagnosing Phys: [name] MD    PROCEDURE: The transesophogeal probe was passed through the esophogus of the patient. The patient  developed no complications during the procedure.  IMPRESSIONS   1. The left ventricle has normal systolic function, with an ejection fraction of 55-60%. No evidence of left ventricular regional wall motion abnormalities. 2. The right ventricle has normal systolc function. The cavity was normal. 3. Left atrial size was severely dilated. 4. No evidence of a thrombus present in the left atrial appendage. 5. The mitral valve is abnormal. Mild thickening of the mitral valve leaflet. Mitral valve regurgitation is moderate to severe by color flow Doppler. 6. The aortic valve is tricuspid No stenosis of the aortic valve. 7. There is evidence of mild plaque in the descending aorta. 8. Normal LV systolic function; severe LAE with no LAA thrombus; mild RAE; moderately severe (3+) MR; mild TR.  FINDINGS Left Ventricle: The left ventricle has normal systolic function, with an ejection fraction of 55-60%. No evidence of left ventricular regional wall  motion abnormalities.  Right Ventricle: The right ventricle has normal systolic function. The cavity was normal.  Left Atrium: Left atrial size was severely dilated.   Left Atrial Appendage: No evidence of a thrombus present in the left atrial appendage.  Right Atrium: Right atrial size was mildly dilated.  Interatrial Septum: No atrial level shunt detected by color flow Doppler.  Pericardium: There is no evidence of pericardial effusion.  Mitral Valve: The mitral valve is abnormal. Mild thickening of the mitral valve leaflet. Mitral valve regurgitation is moderate to severe by color flow Doppler.  Tricuspid Valve: The tricuspid valve was normal in structure. Tricuspid valve regurgitation is mild by color flow Doppler.  Aortic Valve: The aortic valve is tricuspid Aortic valve regurgitation was not visualized by color flow Doppler. There is No stenosis of the aortic valve.  Pulmonic Valve: The pulmonic valve was grossly normal. Pulmonic valve regurgitation is not visualized by color flow Doppler.  Aorta: There is evidence of mild plaque in the descending aorta.  Additional Findings: Normal LV systolic function; severe LAE with no LAA thrombus; mild RAE; moderately severe (3+) MR; mild TR.   [name] MD Electronically signed by [name] MD Signature Date/Time: 03/04/2019/10:21:22 AM    Final           Recent Labs: 08/13/2022: B Natriuretic Peptide 74.5; TSH 1.144 08/14/2022: ALT 55 08/16/2022: Magnesium 1.7 08/18/2022: Hemoglobin 9.8; Platelets 242 11/12/2022: BUN 34; Creatinine, Ser 1.74; Potassium 4.5; Sodium 139  Recent Lipid Panel    Component Value Date/Time   CHOL  02/14/2009 0630    126        ATP III CLASSIFICATION:  <200     mg/dL   Desirable  161-096  mg/dL   Borderline High  >=045    mg/dL   High          TRIG 95 02/14/2009 0630   HDL 48 02/14/2009 0630   CHOLHDL 2.6 02/14/2009 0630   VLDL 19 02/14/2009 0630   LDLCALC  02/14/2009 0630    59         Total Cholesterol/HDL:CHD Risk Coronary Heart Disease Risk Table                     Men   Women  1/2 Average Risk   3.4   3.3  Average Risk       5.0   4.4  2 X Average Risk   9.6   7.1  3 X Average Risk  23.4   11.0        Use the calculated  Patient Ratio above and the CHD Risk Table to determine the patient's CHD Risk.        ATP III CLASSIFICATION (LDL):  <100     mg/dL   Optimal  578-469  mg/dL   Near or Above                    Optimal  130-159  mg/dL   Borderline  629-528  mg/dL   High  >413     mg/dL   Very High    History of Present Illness    67 year old male with the above past medical history including permanent atrial fibrillation, cardiomyopathy with improved EF, chronic diastolic heart failure, mild to moderate mitral valve regurgitation, hypertension, hyperlipidemia, CKD stage III-IV, and obesity.   Cardiac catheterization in 2004 showed EF 45%, normal coronary arteries.  He had atrial fibrillation at the time, LV function improved with restoration of sinus rhythm.  He ultimately went atrial fibrillation ablation.  He did well for several years but then had recurrent atrial fibrillation.  TEE in August 2020 showed normal LV function, severe left atrial enlargement, moderate to severe mitral valve regurgitation, mild tricuspid valve regurgitation.  He underwent DCCV in 02/2019 with early recurrence of atrial fibrillation.  Repeat DCCV in December 2020 was also unsuccessful.  He was not felt to be a candidate for repeat ablation given severe left atrial enlargement, obesity.  He was evaluated by CT surgery for consideration of surgical maze procedure, medical therapy was ultimately advised.  Echocardiogram in 03/2021 showed normal LV function, mild LVH, mild RV, mild LAE, mild to moderate MR. At his follow-up visit in 03/2022 he noted worsening dyspnea on exertion, chronic pedal edema.  His torsemide was increased to 60 mg twice daily with an additional 20 mg as needed for  worsening edema.  Hydralazine was increased to 100 mg 3 times daily.  He was hospitalized in October 2023 in the setting of acute on chronic diastolic heart failure secondary to urinary retention due to enlarged prostate, AKI on CKD.  Urology and nephrology were consulted.  Repeat echocardiogram showed EF 60 to 65%, normal LV function, no RWMA, mild LVH, normal RV systolic function, mildly dilated left atrium, mild mitral valve regurgitation.  He was hospitalized in February 2024 in the setting of AKI on CKD stage IV (baseline creatinine 3.5-4.5), UTI with sepsis, uremia. He was discharged with a foley catheter, with subsequent improvement in his renal function.  His wife contacted our office on 11/04/2022 with complaints of worsening shortness of breath, orthopnea.  Torsemide was increased to 60 mg daily x 3 days followed by torsemide 40 mg daily with plans for close follow-up.  He was last seen in the office on 11/12/2022.  He noted significant improvement in his lower extremity edema, weight was down.  BP was elevated (he had not yet taken his BP medication at time).   He presents today for follow-up accompanied by his wife.  Since his last visit he has gained 11 pounds.  He took prednisone to treat a rash on his abdomen.  He reports dietary indiscretion in the setting.  He also notes he "threw out" his back and was having difficulty getting up to empty his catheter bag.  As a result, he stopped his torsemide for 2 days. He has had worsening dyspnea on exertion, increased bilateral lower extremity edema.    Home Medications    Current Outpatient Medications  Medication Sig Dispense Refill   carvedilol (COREG)  25 MG tablet Take 1 tablet (25 mg total) by mouth 2 (two) times daily. 180 tablet 3   clonazePAM (KLONOPIN) 0.5 MG tablet Take 0.5 mg by mouth at bedtime.     diltiazem (CARDIZEM CD) 360 MG 24 hr capsule TAKE 1 CAPSULE(360 MG) BY MOUTH DAILY 90 capsule 0   DULoxetine (CYMBALTA) 60 MG capsule Take 60  mg by mouth 2 (two) times daily.     ELIQUIS 5 MG TABS tablet TAKE 1 TABLET BY MOUTH TWICE DAILY 60 tablet 5   finasteride (PROSCAR) 5 MG tablet Take 1 tablet (5 mg total) by mouth daily. 30 tablet 0   methocarbamol (ROBAXIN) 750 MG tablet Take 750 mg by mouth every 6 (six) hours as needed for muscle spasms.     omeprazole (PRILOSEC) 20 MG capsule Take 20 mg by mouth daily as needed (for acid reflux).     QUEtiapine (SEROQUEL) 25 MG tablet Take 1/2 tablet (12.5 mg total) by mouth at bedtime. 20 tablet 0   rOPINIRole (REQUIP) 1 MG tablet Take 1-2 tablets (1-2 mg total) by mouth at bedtime as needed (restless leg syndrome). 1 tablet after lunch and 2 tablet at bedtime 120 tablet 0   tamsulosin (FLOMAX) 0.4 MG CAPS capsule Take 1 capsule (0.4 mg total) by mouth daily after supper. 30 capsule 1   torsemide (DEMADEX) 20 MG tablet Take 2 tablets (40 mg total) by mouth daily. (Patient taking differently: Take 60 mg by mouth daily. 60 mg daily) 180 tablet 3   TYLENOL 500 MG tablet Take 1,000 mg by mouth every 6 (six) hours as needed for mild pain or headache.     No current facility-administered medications for this visit.     Review of Systems    He denies chest pain, palpitations, pnd, orthopnea, n, v, dizziness, syncope, or early satiety. All other systems reviewed and are otherwise negative except as noted above.   Physical Exam    VS:  BP 130/78 (BP Location: Right Arm, Patient Position: Sitting, Cuff Size: Large)   Pulse 77   Ht 6\' 4"  (1.93 m)   Wt (!) 303 lb (137.4 kg)   BMI 36.88 kg/m   STOP-Bang Score:  6      GEN: Well nourished, well developed, in no acute distress. HEENT: normal. Neck: Supple, no JVD, carotid bruits, or masses. Cardiac: IRIR, no murmurs, rubs, or gallops. No clubbing, cyanosis, edema.  Radials/DP/PT 2+ and equal bilaterally.  Respiratory:  Respirations regular and unlabored, clear to auscultation bilaterally. GI: Soft, nontender, nondistended, BS + x 4. MS: no  deformity or atrophy. Skin: warm and dry, no rash. Neuro:  Strength and sensation are intact. Psych: Normal affect.  Accessory Clinical Findings    ECG personally reviewed by me today - atrial fibrillation, 77 bpm- no acute changes.   Lab Results  Component Value Date   WBC 21.5 (H) 08/18/2022   HGB 9.8 (L) 08/18/2022   HCT 31.4 (L) 08/18/2022   MCV 85.8 08/18/2022   PLT 242 08/18/2022   Lab Results  Component Value Date   CREATININE 1.74 (H) 11/12/2022   BUN 34 (H) 11/12/2022   NA 139 11/12/2022   K 4.5 11/12/2022   CL 105 11/12/2022   CO2 20 11/12/2022   Lab Results  Component Value Date   ALT 55 (H) 08/14/2022   AST 38 08/14/2022   ALKPHOS 90 08/14/2022   BILITOT 0.7 08/14/2022   Lab Results  Component Value Date   CHOL  02/14/2009  126        ATP III CLASSIFICATION:  <200     mg/dL   Desirable  161-096  mg/dL   Borderline High  >=045    mg/dL   High          HDL 48 02/14/2009   LDLCALC  02/14/2009    59        Total Cholesterol/HDL:CHD Risk Coronary Heart Disease Risk Table                     Men   Women  1/2 Average Risk   3.4   3.3  Average Risk       5.0   4.4  2 X Average Risk   9.6   7.1  3 X Average Risk  23.4   11.0        Use the calculated Patient Ratio above and the CHD Risk Table to determine the patient's CHD Risk.        ATP III CLASSIFICATION (LDL):  <100     mg/dL   Optimal  409-811  mg/dL   Near or Above                    Optimal  130-159  mg/dL   Borderline  914-782  mg/dL   High  >956     mg/dL   Very High   TRIG 95 21/30/8657   CHOLHDL 2.6 02/14/2009    Lab Results  Component Value Date   HGBA1C 6.5 (H) 04/19/2021    Assessment & Plan    1. Chronic diastolic heart failure/cardiomyopathy: Prior EF 45%. Cath at the time showed normal coronaries. Most recent echo in 04/2022 showed EF 60 to 65%, normal LV function, no RWMA, mild LVH, normal RV systolic function, mildly dilated left atrium, mild mitral valve  regurgitation.  Recent dyspnea, orthopnea, 30 pound weight gain over the past 2 months.  Symptoms improved dramatically with increased torsemide, adherence to sodium and fluid recommendations.  His weight was down 16 pounds, but is now up 11 pounds since his last visit.  He notes worsening dyspnea on exertion, increased bilateral lower extremity edema.  Will check BMET today.  Will increase torsemide to 60 mg daily.  Will plan for repeat labs at next follow-up visit.  Will once again plan for close follow-up to encourage adherence.  Reinforced daily weights, sodium and fluid recommendations.  Continue carvedilol, torsemide as above.   2. Permanent atrial fibrillation: Rate controlled. Continue diltiazem, carvedilol, Eliquis.    3. Mitral valve regurgitation: Mild on most recent echo.    4. Hypertension: BP well controlled. Continue current antihypertensive regimen.    5. CKD stage III-IV: Creatinine rose to 10.08 during hospitalization in 08/2022 (likely exacerbated by acute urinary retention, bladder outlet obstruction). Renal function improved significantly with placement of Foley catheter. Following with urology and nephrology.  Creatinine was 1.74 on 11/12/2022.  Repeat BMET pending.  Continue to monitor renal function closely with increased diuretic as above.   6. Obesity: Activity has been limited in the setting of acute on chronic heart failure, multiple hospitalizations.  He has been walking for exercise and notes he walked 1 mile earlier today.  Encouraged increased activity as tolerated.    7. History of snoring: Previously referred for sleep study, this was never completed. He declines sleep study at this time.    8. Disposition: Follow-up in 1 week.      Joylene Grapes, NP  11/28/2022, 7:02 PM

## 2022-11-28 NOTE — Patient Instructions (Signed)
Medication Instructions:  Increase Torsemide 60 mg daily  *If you need a refill on your cardiac medications before your next appointment, please call your pharmacy*   Lab Work: BMET today   Testing/Procedures: NONE ordered at this time of appointment     Follow-Up: At [facility], you and your health needs are our priority.  As part of our continuing mission to provide you with exceptional heart care, we have created designated Provider Care Teams.  These Care Teams include your primary Cardiologist (physician) and Advanced Practice Providers (APPs -  Physician Assistants and Nurse Practitioners) who all work together to provide you with the care you need, when you need it.  We recommend signing up for the patient portal called "MyChart".  Sign up information is provided on this After Visit Summary.  MyChart is used to connect with patients for Virtual Visits (Telemedicine).  Patients are able to view lab/test results, encounter notes, upcoming appointments, etc.  Non-urgent messages can be sent to your provider as well.   To learn more about what you can do with MyChart, go to [email or web address].    Your next appointment:   1 week(s)  Provider:   Bernadene Person, NP        Other Instructions

## 2022-11-29 LAB — BASIC METABOLIC PANEL
BUN/Creatinine Ratio: 18 (ref 10–24)
BUN: 32 mg/dL — ABNORMAL HIGH (ref 8–27)
CO2: 17 mmol/L — ABNORMAL LOW (ref 20–29)
Calcium: 8.4 mg/dL — ABNORMAL LOW (ref 8.6–10.2)
Chloride: 104 mmol/L (ref 96–106)
Creatinine, Ser: 1.79 mg/dL — ABNORMAL HIGH (ref 0.76–1.27)
Glucose: 128 mg/dL — ABNORMAL HIGH (ref 70–99)
Potassium: 3.9 mmol/L (ref 3.5–5.2)
Sodium: 136 mmol/L (ref 134–144)
eGFR: 42 mL/min/{1.73_m2} — ABNORMAL LOW (ref >59)

## 2022-12-05 ENCOUNTER — Other Ambulatory Visit: Payer: Self-pay

## 2022-12-05 ENCOUNTER — Ambulatory Visit: Payer: Medicare Other | Admitting: Nurse Practitioner

## 2022-12-05 DIAGNOSIS — I5032 Chronic diastolic (congestive) heart failure: Secondary | ICD-10-CM

## 2022-12-05 DIAGNOSIS — Z79899 Other long term (current) drug therapy: Secondary | ICD-10-CM

## 2022-12-05 DIAGNOSIS — I10 Essential (primary) hypertension: Secondary | ICD-10-CM

## 2022-12-05 NOTE — Progress Notes (Deleted)
Office Visit    Patient Name: [name] Date of Encounter: 12/05/2022  Primary Care Provider:  Johny Blamer, MD Primary Cardiologist:  [name], MD  Chief Complaint    67 year old male with a history of permanent atrial fibrillation, cardiomyopathy with improved EF, chronic diastolic heart failure, mild to moderate mitral valve regurgitation, hypertension, hyperlipidemia, CKD stage III-IV, and obesity who presents for follow-up related to heart failure.  Past Medical History    Past Medical History:  Diagnosis Date   Allergic rhinitis    Anxiety    Asthma    as a child   Chronic low back pain    Complication of anesthesia    "hard to put asleep, hard to wake up, nausea and vomiting"   Depression    ED (erectile dysfunction)    GERD (gastroesophageal reflux disease)    HLD (hyperlipidemia)    HTN (hypertension)    sees Dr. Elias Else, [facility]   Hypertrophy of prostate with urinary obstruction and other lower urinary tract symptoms (LUTS)    IBS (irritable bowel syndrome)    Inguinal hernia without mention of obstruction or gangrene, unilateral or unspecified, (not specified as recurrent)    Insomnia    Lumbar herniated disc    L7   Morbid obesity (HCC)    Narcotic addiction (HCC)    NICM (nonischemic cardiomyopathy) (HCC)    tachycardia mediated,  resolved with sinus   Persistent atrial fibrillation (HCC)    ablation done 03/2006 at [facility]   Pneumonia    hx of 2004   PONV (postoperative nausea and vomiting)    Twitching    legs   Past Surgical History:  Procedure Laterality Date   ATRIAL ABLATION SURGERY  2007   [facility]   CARDIOVERSION  08/11/2011   Procedure: CARDIOVERSION;  Surgeon: Lewayne Bunting, MD;  Location: [facility] OR;  Service: Cardiovascular;  Laterality: N/A;   CARDIOVERSION N/A 04/16/2017   Procedure: CARDIOVERSION;  Surgeon: Thurmon Fair, MD;  Location: [facility] ENDOSCOPY;  Service: Cardiovascular;  Laterality: N/A;   CARDIOVERSION  N/A 03/04/2019   Procedure: CARDIOVERSION;  Surgeon: Lewayne Bunting, MD;  Location: [facility] ENDOSCOPY;  Service: Cardiovascular;  Laterality: N/A;   CARDIOVERSION N/A 07/04/2019   Procedure: CARDIOVERSION;  Surgeon: Chilton Si, MD;  Location: [facility] ENDOSCOPY;  Service: Cardiovascular;  Laterality: N/A;   EYE SURGERY     lasik, 1998   HERNIA REPAIR     1977   INGUINAL HERNIA REPAIR Right 08/26/2012   Procedure: LAPAROSCOPIC INGUINAL HERNIA;  Surgeon: Lodema Pilot, DO;  Location: [facility] OR;  Service: General;  Laterality: Right;  laparoscopic right inguinal hernia repair with mesh, umbilical hernia repair   INSERTION OF MESH Right 08/26/2012   Procedure: INSERTION OF MESH;  Surgeon: Lodema Pilot, DO;  Location: [facility] OR;  Service: General;  Laterality: Right;  right inguinal hernia   IR FLUORO GUIDE CV LINE RIGHT  08/15/2022   IR US GUIDE VASC ACCESS RIGHT  08/15/2022   TEE WITHOUT CARDIOVERSION N/A 03/04/2019   Procedure: TRANSESOPHAGEAL ECHOCARDIOGRAM (TEE);  Surgeon: Lewayne Bunting, MD;  Location: [facility] ENDOSCOPY;  Service: Cardiovascular;  Laterality: N/A;   UMBILICAL HERNIA REPAIR N/A 08/26/2012   Procedure: HERNIA REPAIR UMBILICAL ADULT;  Surgeon: Lodema Pilot, DO;  Location: [facility] OR;  Service: General;  Laterality: N/A;    Allergies  Allergies  Allergen Reactions   Baclofen Other (See Comments)    Severe dizziness   Doxycycline Hyclate Other (See Comments)  Flu-like symptoms   Gabapentin Swelling and Other (See Comments)    Swelling in feet   Hycodan [Hydrocodone Bit-Homatrop Mbr] Other (See Comments)    Dizziness    Hydrocodone-Acetaminophen Other (See Comments)    GI upset, Dizziness   Pregabalin Swelling and Other (See Comments)    Severly dry mouth     Labs/Other Studies Reviewed    The following studies were reviewed today:  Cardiac Studies & Procedures       ECHOCARDIOGRAM  ECHOCARDIOGRAM COMPLETE 04/25/2022  Narrative ECHOCARDIOGRAM REPORT    Patient Name:    [name] Date of Exam: 04/25/2022 Medical Rec #:  [id number]         Height:       76.0 in Accession #:    [id number]        Weight:       350.1 lb Date of Birth:  February 20, 1956        BSA:          2.810 m Patient Age:    65 years          BP:           169/102 mmHg Patient Gender: M                 HR:           90 bpm. Exam Location:  Inpatient  Procedure: 2D Echo, Color Doppler and Cardiac Doppler  Indications:    CHF- Acute Diastolic  History:        Patient has prior history of Echocardiogram examinations, most recent 04/02/2021. Arrythmias:Atrial Fibrillation; Risk Factors:Hypertension and Dyslipidemia.  Referring Phys: [id number] CAROLE N HALL  IMPRESSIONS   1. Left ventricular ejection fraction, by estimation, is 60 to 65%. The left ventricle has normal function. The left ventricle has no regional wall motion abnormalities. There is mild left ventricular hypertrophy. Left ventricular diastolic parameters are indeterminate. 2. Right ventricular systolic function is normal. The right ventricular size is normal. 3. Left atrial size was mildly dilated. 4. Mild mitral valve regurgitation. 5. The aortic valve is normal in structure. Aortic valve regurgitation is not visualized.  Comparison(s): The left ventricular function is unchanged compared to echo from September 2022.Marland Kitchen  FINDINGS Left Ventricle: Left ventricular ejection fraction, by estimation, is 60 to 65%. The left ventricle has normal function. The left ventricle has no regional wall motion abnormalities. The left ventricular internal cavity size was normal in size. There is mild left ventricular hypertrophy. Left ventricular diastolic parameters are indeterminate.  Right Ventricle: The right ventricular size is normal. Right vetricular wall thickness was not assessed. Right ventricular systolic function is normal.  Left Atrium: Left atrial size was mildly dilated.  Right Atrium: Right atrial size was normal in  size.  Pericardium: Trivial pericardial effusion is present.  Mitral Valve: There is mild thickening of the mitral valve leaflet(s). Mild mitral valve regurgitation.  Tricuspid Valve: The tricuspid valve is normal in structure. Tricuspid valve regurgitation is trivial.  Aortic Valve: The aortic valve is normal in structure. Aortic valve regurgitation is not visualized. Aortic valve mean gradient measures 4.0 mmHg. Aortic valve peak gradient measures 7.3 mmHg. Aortic valve area, by VTI measures 2.93 cm.  Pulmonic Valve: The pulmonic valve was normal in structure. Pulmonic valve regurgitation is not visualized.  Aorta: The aortic root and ascending aorta are structurally normal, with no evidence of dilitation.  IAS/Shunts: The interatrial septum was not assessed.   LEFT VENTRICLE PLAX 2D LVIDd:  5.40 cm LVIDs:         3.70 cm LV PW:         1.20 cm LV IVS:        1.30 cm LVOT diam:     2.30 cm LV SV:         72 LV SV Index:   26 LVOT Area:     4.15 cm   RIGHT VENTRICLE TAPSE (M-mode): 1.8 cm  LEFT ATRIUM              Index        RIGHT ATRIUM           Index LA diam:        6.00 cm  2.13 cm/m   RA Area:     18.60 cm LA Vol (A2C):   124.5 ml 44.30 ml/m  RA Volume:   45.70 ml  16.26 ml/m LA Vol (A4C):   109.0 ml 38.79 ml/m LA Biplane Vol: 106.0 ml 37.72 ml/m AORTIC VALVE AV Area (Vmax):    3.23 cm AV Area (Vmean):   3.03 cm AV Area (VTI):     2.93 cm AV Vmax:           135.00 cm/s AV Vmean:          95.600 cm/s AV VTI:            0.247 m AV Peak Grad:      7.3 mmHg AV Mean Grad:      4.0 mmHg LVOT Vmax:         105.00 cm/s LVOT Vmean:        69.700 cm/s LVOT VTI:          0.174 m LVOT/AV VTI ratio: 0.70  AORTA Ao Root diam: 3.40 cm Ao Asc diam:  3.80 cm  MR Peak grad: 76.2 mmHg MR Vmax:      436.50 cm/s SHUNTS Systemic VTI:  0.17 m Systemic Diam: 2.30 cm  Dietrich Pates MD Electronically signed by Dietrich Pates MD Signature Date/Time:  04/25/2022/10:56:14 AM    Final   TEE  ECHO TEE 03/04/2019  Narrative TRANSESOPHOGEAL ECHO REPORT    Patient Name:   [name] Date of Exam: 03/04/2019 Medical Rec #:  [id number]         Height:       77.0 in Accession #:    [id number]        Weight:       329.0 lb Date of Birth:  [birth year]        BSA:          2.76 m Patient Age:    62 years          BP:           144/101 mmHg Patient Gender: M                 HR:           123 bpm. Exam Location:  Inpatient   Procedure: Transesophageal Echo, Cardiac Doppler and Color Doppler  Indications:     Afib  History:         Patient has prior history of Echocardiogram examinations, most recent 03/31/2017. Atrial Fibrillation Risk Factors: Hypertension, Obesity and Sleep Apnea.  Sonographer:     Lavenia Atlas Referring Phys:  [id number] BRIAN S CRENSHAW Diagnosing Phys: [name] MD    PROCEDURE: The transesophogeal probe was passed through the esophogus of the patient. The patient  developed no complications during the procedure.  IMPRESSIONS   1. The left ventricle has normal systolic function, with an ejection fraction of 55-60%. No evidence of left ventricular regional wall motion abnormalities. 2. The right ventricle has normal systolc function. The cavity was normal. 3. Left atrial size was severely dilated. 4. No evidence of a thrombus present in the left atrial appendage. 5. The mitral valve is abnormal. Mild thickening of the mitral valve leaflet. Mitral valve regurgitation is moderate to severe by color flow Doppler. 6. The aortic valve is tricuspid No stenosis of the aortic valve. 7. There is evidence of mild plaque in the descending aorta. 8. Normal LV systolic function; severe LAE with no LAA thrombus; mild RAE; moderately severe (3+) MR; mild TR.  FINDINGS Left Ventricle: The left ventricle has normal systolic function, with an ejection fraction of 55-60%. No evidence of left ventricular regional wall  motion abnormalities.  Right Ventricle: The right ventricle has normal systolic function. The cavity was normal.  Left Atrium: Left atrial size was severely dilated.   Left Atrial Appendage: No evidence of a thrombus present in the left atrial appendage.  Right Atrium: Right atrial size was mildly dilated.  Interatrial Septum: No atrial level shunt detected by color flow Doppler.  Pericardium: There is no evidence of pericardial effusion.  Mitral Valve: The mitral valve is abnormal. Mild thickening of the mitral valve leaflet. Mitral valve regurgitation is moderate to severe by color flow Doppler.  Tricuspid Valve: The tricuspid valve was normal in structure. Tricuspid valve regurgitation is mild by color flow Doppler.  Aortic Valve: The aortic valve is tricuspid Aortic valve regurgitation was not visualized by color flow Doppler. There is No stenosis of the aortic valve.  Pulmonic Valve: The pulmonic valve was grossly normal. Pulmonic valve regurgitation is not visualized by color flow Doppler.  Aorta: There is evidence of mild plaque in the descending aorta.  Additional Findings: Normal LV systolic function; severe LAE with no LAA thrombus; mild RAE; moderately severe (3+) MR; mild TR.   [name] MD Electronically signed by [name] MD Signature Date/Time: 03/04/2019/10:21:22 AM    Final           Recent Labs: 08/13/2022: B Natriuretic Peptide 74.5; TSH 1.144 08/14/2022: ALT 55 08/16/2022: Magnesium 1.7 08/18/2022: Hemoglobin 9.8; Platelets 242 11/28/2022: BUN 32; Creatinine, Ser 1.79; Potassium 3.9; Sodium 136  Recent Lipid Panel    Component Value Date/Time   CHOL  02/14/2009 0630    126        ATP III CLASSIFICATION:  <200     mg/dL   Desirable  161-096  mg/dL   Borderline High  >=045    mg/dL   High          TRIG 95 02/14/2009 0630   HDL 48 02/14/2009 0630   CHOLHDL 2.6 02/14/2009 0630   VLDL 19 02/14/2009 0630   LDLCALC  02/14/2009 0630    59         Total Cholesterol/HDL:CHD Risk Coronary Heart Disease Risk Table                     Men   Women  1/2 Average Risk   3.4   3.3  Average Risk       5.0   4.4  2 X Average Risk   9.6   7.1  3 X Average Risk  23.4   11.0        Use the calculated  Patient Ratio above and the CHD Risk Table to determine the patient's CHD Risk.        ATP III CLASSIFICATION (LDL):  <100     mg/dL   Optimal  161-096  mg/dL   Near or Above                    Optimal  130-159  mg/dL   Borderline  045-409  mg/dL   High  >811     mg/dL   Very High    History of Present Illness    67 year old male with the above past medical history including permanent atrial fibrillation, cardiomyopathy with improved EF, chronic diastolic heart failure, mild to moderate mitral valve regurgitation, hypertension, hyperlipidemia, CKD stage III-IV, and obesity.   Cardiac catheterization in 2004 showed EF 45%, normal coronary arteries.  He had atrial fibrillation at the time, LV function improved with restoration of sinus rhythm.  He ultimately went atrial fibrillation ablation.  He did well for several years but then had recurrent atrial fibrillation.  TEE in August 2020 showed normal LV function, severe left atrial enlargement, moderate to severe mitral valve regurgitation, mild tricuspid valve regurgitation.  He underwent DCCV in 02/2019 with early recurrence of atrial fibrillation.  Repeat DCCV in December 2020 was also unsuccessful.  He was not felt to be a candidate for repeat ablation given severe left atrial enlargement, obesity.  He was evaluated by CT surgery for consideration of surgical maze procedure, medical therapy was ultimately advised.  Echocardiogram in 03/2021 showed normal LV function, mild LVH, mild RV, mild LAE, mild to moderate MR. At his follow-up visit in 03/2022 he noted worsening dyspnea on exertion, chronic pedal edema.  His torsemide was increased to 60 mg twice daily with an additional 20 mg as needed for  worsening edema.  Hydralazine was increased to 100 mg 3 times daily.  He was hospitalized in October 2023 in the setting of acute on chronic diastolic heart failure secondary to urinary retention due to enlarged prostate, AKI on CKD.  Urology and nephrology were consulted.  Repeat echocardiogram showed EF 60 to 65%, normal LV function, no RWMA, mild LVH, normal RV systolic function, mildly dilated left atrium, mild mitral valve regurgitation.  He was hospitalized in February 2024 in the setting of AKI on CKD stage IV (baseline creatinine 3.5-4.5), UTI with sepsis, uremia. He was discharged with a foley catheter, with subsequent improvement in his renal function.  His wife contacted our office on 11/04/2022 with complaints of worsening shortness of breath, orthopnea.  Torsemide was increased to 60 mg daily x 3 days followed by torsemide 40 mg daily. He noted significant improvement in his lower extremity edema loss with this change as well as adherence to sodium and fluid recommendations.  He was last seen in the office on 11/28/2022 and noted an 11 pound weight gain, in the setting of nonadherence to medication regimen, dietary indiscretion.  He noted increased bilateral lower extremity edema, worsening dyspnea on exertion.  Torsemide was increased to 60 mg daily x 1 week.   He presents today for follow-up.  Since his last visit he has  1. Chronic diastolic heart failure/cardiomyopathy: Prior EF 45%. Cath at the time showed normal coronaries. Most recent echo in 04/2022 showed EF 60 to 65%, normal LV function, no RWMA, mild LVH, normal RV systolic function, mildly dilated left atrium, mild mitral valve regurgitation.  Recent dyspnea, orthopnea, 30 pound weight gain over the past 2 months.  Symptoms improved dramatically with increased torsemide, adherence to sodium and fluid recommendations.  His weight was down 16 pounds, but is now up 11 pounds since his last visit.  He notes worsening dyspnea on exertion,  increased bilateral lower extremity edema.  Will check BMET today.  Will increase torsemide to 60 mg daily.  Will plan for repeat labs at next follow-up visit.  Will once again plan for close follow-up to encourage adherence.  Reinforced daily weights, sodium and fluid recommendations.  Continue carvedilol, torsemide as above.   2. Permanent atrial fibrillation: Rate controlled. Continue diltiazem, carvedilol, Eliquis.    3. Mitral valve regurgitation: Mild on most recent echo.    4. Hypertension: BP well controlled. Continue current antihypertensive regimen.    5. CKD stage III-IV: Creatinine rose to 10.08 during hospitalization in 08/2022 (likely exacerbated by acute urinary retention, bladder outlet obstruction). Renal function improved significantly with placement of Foley catheter. Following with urology and nephrology.  Creatinine was 1.74 on 11/12/2022.  Repeat BMET pending.  Continue to monitor renal function closely with increased diuretic as above.   6. Obesity: Activity has been limited in the setting of acute on chronic heart failure, multiple hospitalizations.  He has been walking for exercise and notes he walked 1 mile earlier today.  Encouraged increased activity as tolerated.    7. History of snoring: Previously referred for sleep study, this was never completed. He declines sleep study at this time.    8. Disposition: Follow-up in  Home Medications    Current Outpatient Medications  Medication Sig Dispense Refill   carvedilol (COREG) 25 MG tablet Take 1 tablet (25 mg total) by mouth 2 (two) times daily. 180 tablet 3   clonazePAM (KLONOPIN) 0.5 MG tablet Take 0.5 mg by mouth at bedtime.     diltiazem (CARDIZEM CD) 360 MG 24 hr capsule TAKE 1 CAPSULE(360 MG) BY MOUTH DAILY 90 capsule 0   DULoxetine (CYMBALTA) 60 MG capsule Take 60 mg by mouth 2 (two) times daily.     ELIQUIS 5 MG TABS tablet TAKE 1 TABLET BY MOUTH TWICE DAILY 60 tablet 5   finasteride (PROSCAR) 5 MG tablet Take 1  tablet (5 mg total) by mouth daily. 30 tablet 0   methocarbamol (ROBAXIN) 750 MG tablet Take 750 mg by mouth every 6 (six) hours as needed for muscle spasms.     omeprazole (PRILOSEC) 20 MG capsule Take 20 mg by mouth daily as needed (for acid reflux).     QUEtiapine (SEROQUEL) 25 MG tablet Take 1/2 tablet (12.5 mg total) by mouth at bedtime. 20 tablet 0   rOPINIRole (REQUIP) 1 MG tablet Take 1-2 tablets (1-2 mg total) by mouth at bedtime as needed (restless leg syndrome). 1 tablet after lunch and 2 tablet at bedtime 120 tablet 0   tamsulosin (FLOMAX) 0.4 MG CAPS capsule Take 1 capsule (0.4 mg total) by mouth daily after supper. 30 capsule 1   torsemide (DEMADEX) 20 MG tablet Take 2 tablets (40 mg total) by mouth daily. (Patient taking differently: Take 60 mg by mouth daily. 60 mg daily) 180 tablet 3   TYLENOL 500 MG tablet Take 1,000 mg by mouth every 6 (six) hours as needed for mild pain or headache.     No current facility-administered medications for this visit.     Review of Systems    ***.  All other systems reviewed and are otherwise negative except as noted above.    Physical Exam    VS:  There were no vitals taken for this visit. , BMI There is no height or weight on file to calculate BMI. STOP-Bang Score:  6  { Consider Dx Sleep Disordered Breathing or Sleep Apnea  ICD G47.33          :1}    GEN: Well nourished, well developed, in no acute distress. HEENT: normal. Neck: Supple, no JVD, carotid bruits, or masses. Cardiac: RRR, no murmurs, rubs, or gallops. No clubbing, cyanosis, edema.  Radials/DP/PT 2+ and equal bilaterally.  Respiratory:  Respirations regular and unlabored, clear to auscultation bilaterally. GI: Soft, nontender, nondistended, BS + x 4. MS: no deformity or atrophy. Skin: warm and dry, no rash. Neuro:  Strength and sensation are intact. Psych: Normal affect.  Accessory Clinical Findings    ECG personally reviewed by me today - *** - no acute changes.    Lab Results  Component Value Date   WBC 21.5 (H) 08/18/2022   HGB 9.8 (L) 08/18/2022   HCT 31.4 (L) 08/18/2022   MCV 85.8 08/18/2022   PLT 242 08/18/2022   Lab Results  Component Value Date   CREATININE 1.79 (H) 11/28/2022   BUN 32 (H) 11/28/2022   NA 136 11/28/2022   K 3.9 11/28/2022   CL 104 11/28/2022   CO2 17 (L) 11/28/2022   Lab Results  Component Value Date   ALT 55 (H) 08/14/2022   AST 38 08/14/2022   ALKPHOS 90 08/14/2022   BILITOT 0.7 08/14/2022   Lab Results  Component Value Date   CHOL  02/14/2009    126        ATP III CLASSIFICATION:  <200     mg/dL   Desirable  161-096  mg/dL   Borderline High  >=045    mg/dL   High          HDL 48 02/14/2009   LDLCALC  02/14/2009    59        Total Cholesterol/HDL:CHD Risk Coronary Heart Disease Risk Table                     Men   Women  1/2 Average Risk   3.4   3.3  Average Risk       5.0   4.4  2 X Average Risk   9.6   7.1  3 X Average Risk  23.4   11.0        Use the calculated Patient Ratio above and the CHD Risk Table to determine the patient's CHD Risk.        ATP III CLASSIFICATION (LDL):  <100     mg/dL   Optimal  409-811  mg/dL   Near or Above                    Optimal  130-159  mg/dL   Borderline  914-782  mg/dL   High  >956     mg/dL   Very High   TRIG 95 21/30/8657   CHOLHDL 2.6 02/14/2009    Lab Results  Component Value Date   HGBA1C 6.5 (H) 04/19/2021    Assessment & Plan    1.  ***  No BP recorded.  {Refresh Note OR Click here to enter BP  :1}***   Joylene Grapes, NP 12/05/2022, 7:15 AM

## 2022-12-08 ENCOUNTER — Telehealth: Payer: Self-pay

## 2022-12-08 NOTE — Telephone Encounter (Signed)
Attempted to reach pt to discuss lab results. VM is full. Will call pt back.

## 2023-01-02 ENCOUNTER — Telehealth: Payer: Self-pay | Admitting: Cardiology

## 2023-01-02 ENCOUNTER — Other Ambulatory Visit: Payer: Self-pay | Admitting: Urology

## 2023-01-02 NOTE — Telephone Encounter (Signed)
   Pre-operative Risk Assessment    Patient Name: [name]  DOB: Apr 14, 1956 MRN: [id number]      Request for Surgical Clearance    Procedure:   Upper Ablation of the Prostatate  Date of Surgery:  Clearance 02/20/23                                 Surgeon:  Dr. Mena Goes Surgeon's Group or Practice Name:  [facility] Phone number:  [phone]  Fax number:  [phone]   Type of Clearance Requested:   - Medical  - Pharmacy:  Hold Apixaban (Eliquis)     Type of Anesthesia:  General    Additional requests/questions:   Caller stated they will need medical and pharmacy clearance.    Signed, Annetta Maw   01/02/2023, 3:18 PM

## 2023-01-02 NOTE — Telephone Encounter (Signed)
Patient with diagnosis of afib on Eliquis for anticoagulation.    Procedure: Upper Ablation of the Prostatate  Date of procedure: 02/20/23   CHA2DS2-VASc Score = 3   This indicates a 3.2% annual risk of stroke. The patient's score is based upon: CHF History: 1 HTN History: 1 Diabetes History: 0 Stroke History: 0 Vascular Disease History: 0 Age Score: 1 Gender Score: 0      CrCl 62 ml/min  Per office protocol, patient can hold Eliquis for 2-3 days prior to procedure.    **This guidance is not considered finalized until pre-operative APP has relayed final recommendations.**

## 2023-01-02 NOTE — Telephone Encounter (Signed)
   Name: [name]  DOB: 1956-02-19  MRN: [id number]  Primary Cardiologist: [name], MD  Chart reviewed as part of pre-operative protocol coverage. The patient has an upcoming visit scheduled with Dr. Bernadene Person, NP on 01/07/2023 at which time clearance can be addressed in case there are any issues that would impact surgical recommendations. I added preop FYI to appointment note so that provider is aware to address at time of outpatient visit.  Per office protocol the cardiology provider should forward their finalized clearance decision and recommendations regarding antiplatelet therapy to the requesting party below.    This message will also be routed to pharmacy pool  for input on holding Eliquis as requested below so that this information is available to the clearing provider at time of patient's appointment.   I will route this message as FYI to requesting party and remove this message from the preop box as separate preop APP input not needed at this time.   Please call with any questions.  Napoleon Form, Leodis Rains, NP  01/02/2023, 4:10 PM

## 2023-01-07 ENCOUNTER — Ambulatory Visit: Payer: Medicare Other | Attending: Nurse Practitioner | Admitting: Nurse Practitioner

## 2023-01-07 ENCOUNTER — Ambulatory Visit: Payer: Medicare Other | Admitting: Nurse Practitioner

## 2023-01-07 NOTE — Progress Notes (Deleted)
Office Visit    Patient Name: [name] Date of Encounter: 01/07/2023  Primary Care Provider:  Noberto Retort, MD Primary Cardiologist:  [name], MD  Chief Complaint    67 year old male with a history of permanent atrial fibrillation, cardiomyopathy with improved EF, chronic diastolic heart failure, mild to moderate mitral valve regurgitation, hypertension, hyperlipidemia, CKD stage III-IV, and obesity who presents for follow-up related to heart failure.   Past Medical History    Past Medical History:  Diagnosis Date   Allergic rhinitis    Anxiety    Asthma    as a child   Chronic low back pain    Complication of anesthesia    "hard to put asleep, hard to wake up, nausea and vomiting"   Depression    ED (erectile dysfunction)    GERD (gastroesophageal reflux disease)    HLD (hyperlipidemia)    HTN (hypertension)    sees Dr. Elias Else, [facility]   Hypertrophy of prostate with urinary obstruction and other lower urinary tract symptoms (LUTS)    IBS (irritable bowel syndrome)    Inguinal hernia without mention of obstruction or gangrene, unilateral or unspecified, (not specified as recurrent)    Insomnia    Lumbar herniated disc    L7   Morbid obesity (HCC)    Narcotic addiction (HCC)    NICM (nonischemic cardiomyopathy) (HCC)    tachycardia mediated,  resolved with sinus   Persistent atrial fibrillation (HCC)    ablation done 03/2006 at [facility]   Pneumonia    hx of 2004   PONV (postoperative nausea and vomiting)    Twitching    legs   Past Surgical History:  Procedure Laterality Date   ATRIAL ABLATION SURGERY  2007   [facility]   CARDIOVERSION  08/11/2011   Procedure: CARDIOVERSION;  Surgeon: Lewayne Bunting, MD;  Location: [facility] OR;  Service: Cardiovascular;  Laterality: N/A;   CARDIOVERSION N/A 04/16/2017   Procedure: CARDIOVERSION;  Surgeon: Thurmon Fair, MD;  Location: [facility] ENDOSCOPY;  Service: Cardiovascular;  Laterality: N/A;    CARDIOVERSION N/A 03/04/2019   Procedure: CARDIOVERSION;  Surgeon: Lewayne Bunting, MD;  Location: [facility] ENDOSCOPY;  Service: Cardiovascular;  Laterality: N/A;   CARDIOVERSION N/A 07/04/2019   Procedure: CARDIOVERSION;  Surgeon: Chilton Si, MD;  Location: [facility] ENDOSCOPY;  Service: Cardiovascular;  Laterality: N/A;   EYE SURGERY     lasik, 1998   HERNIA REPAIR     1977   INGUINAL HERNIA REPAIR Right 08/26/2012   Procedure: LAPAROSCOPIC INGUINAL HERNIA;  Surgeon: Lodema Pilot, DO;  Location: [facility] OR;  Service: General;  Laterality: Right;  laparoscopic right inguinal hernia repair with mesh, umbilical hernia repair   INSERTION OF MESH Right 08/26/2012   Procedure: INSERTION OF MESH;  Surgeon: Lodema Pilot, DO;  Location: [facility] OR;  Service: General;  Laterality: Right;  right inguinal hernia   IR FLUORO GUIDE CV LINE RIGHT  08/15/2022   IR US GUIDE VASC ACCESS RIGHT  08/15/2022   TEE WITHOUT CARDIOVERSION N/A 03/04/2019   Procedure: TRANSESOPHAGEAL ECHOCARDIOGRAM (TEE);  Surgeon: Lewayne Bunting, MD;  Location: [facility] ENDOSCOPY;  Service: Cardiovascular;  Laterality: N/A;   UMBILICAL HERNIA REPAIR N/A 08/26/2012   Procedure: HERNIA REPAIR UMBILICAL ADULT;  Surgeon: Lodema Pilot, DO;  Location: [facility] OR;  Service: General;  Laterality: N/A;    Allergies  Allergies  Allergen Reactions   Baclofen Other (See Comments)    Severe dizziness   Doxycycline Hyclate Other (See Comments)  Flu-like symptoms   Gabapentin Swelling and Other (See Comments)    Swelling in feet   Hycodan [Hydrocodone Bit-Homatrop Mbr] Other (See Comments)    Dizziness    Hydrocodone-Acetaminophen Other (See Comments)    GI upset, Dizziness   Pregabalin Swelling and Other (See Comments)    Severly dry mouth     Labs/Other Studies Reviewed    The following studies were reviewed today:  Cardiac Studies & Procedures       ECHOCARDIOGRAM  ECHOCARDIOGRAM COMPLETE 04/25/2022  Narrative ECHOCARDIOGRAM  REPORT    Patient Name:   [name] Date of Exam: 04/25/2022 Medical Rec #:  [id number]         Height:       76.0 in Accession #:    [id number]        Weight:       350.1 lb Date of Birth:  May 19, 1956        BSA:          2.810 m Patient Age:    65 years          BP:           169/102 mmHg Patient Gender: M                 HR:           90 bpm. Exam Location:  Inpatient  Procedure: 2D Echo, Color Doppler and Cardiac Doppler  Indications:    CHF- Acute Diastolic  History:        Patient has prior history of Echocardiogram examinations, most recent 04/02/2021. Arrythmias:Atrial Fibrillation; Risk Factors:Hypertension and Dyslipidemia.  Referring Phys: [id number] CAROLE N HALL  IMPRESSIONS   1. Left ventricular ejection fraction, by estimation, is 60 to 65%. The left ventricle has normal function. The left ventricle has no regional wall motion abnormalities. There is mild left ventricular hypertrophy. Left ventricular diastolic parameters are indeterminate. 2. Right ventricular systolic function is normal. The right ventricular size is normal. 3. Left atrial size was mildly dilated. 4. Mild mitral valve regurgitation. 5. The aortic valve is normal in structure. Aortic valve regurgitation is not visualized.  Comparison(s): The left ventricular function is unchanged compared to echo from September 2022.Marland Kitchen  FINDINGS Left Ventricle: Left ventricular ejection fraction, by estimation, is 60 to 65%. The left ventricle has normal function. The left ventricle has no regional wall motion abnormalities. The left ventricular internal cavity size was normal in size. There is mild left ventricular hypertrophy. Left ventricular diastolic parameters are indeterminate.  Right Ventricle: The right ventricular size is normal. Right vetricular wall thickness was not assessed. Right ventricular systolic function is normal.  Left Atrium: Left atrial size was mildly dilated.  Right Atrium: Right  atrial size was normal in size.  Pericardium: Trivial pericardial effusion is present.  Mitral Valve: There is mild thickening of the mitral valve leaflet(s). Mild mitral valve regurgitation.  Tricuspid Valve: The tricuspid valve is normal in structure. Tricuspid valve regurgitation is trivial.  Aortic Valve: The aortic valve is normal in structure. Aortic valve regurgitation is not visualized. Aortic valve mean gradient measures 4.0 mmHg. Aortic valve peak gradient measures 7.3 mmHg. Aortic valve area, by VTI measures 2.93 cm.  Pulmonic Valve: The pulmonic valve was normal in structure. Pulmonic valve regurgitation is not visualized.  Aorta: The aortic root and ascending aorta are structurally normal, with no evidence of dilitation.  IAS/Shunts: The interatrial septum was not assessed.   LEFT VENTRICLE PLAX 2D LVIDd:  5.40 cm LVIDs:         3.70 cm LV PW:         1.20 cm LV IVS:        1.30 cm LVOT diam:     2.30 cm LV SV:         72 LV SV Index:   26 LVOT Area:     4.15 cm   RIGHT VENTRICLE TAPSE (M-mode): 1.8 cm  LEFT ATRIUM              Index        RIGHT ATRIUM           Index LA diam:        6.00 cm  2.13 cm/m   RA Area:     18.60 cm LA Vol (A2C):   124.5 ml 44.30 ml/m  RA Volume:   45.70 ml  16.26 ml/m LA Vol (A4C):   109.0 ml 38.79 ml/m LA Biplane Vol: 106.0 ml 37.72 ml/m AORTIC VALVE AV Area (Vmax):    3.23 cm AV Area (Vmean):   3.03 cm AV Area (VTI):     2.93 cm AV Vmax:           135.00 cm/s AV Vmean:          95.600 cm/s AV VTI:            0.247 m AV Peak Grad:      7.3 mmHg AV Mean Grad:      4.0 mmHg LVOT Vmax:         105.00 cm/s LVOT Vmean:        69.700 cm/s LVOT VTI:          0.174 m LVOT/AV VTI ratio: 0.70  AORTA Ao Root diam: 3.40 cm Ao Asc diam:  3.80 cm  MR Peak grad: 76.2 mmHg MR Vmax:      436.50 cm/s SHUNTS Systemic VTI:  0.17 m Systemic Diam: 2.30 cm  Dietrich Pates MD Electronically signed by Dietrich Pates  MD Signature Date/Time: 04/25/2022/10:56:14 AM    Final   TEE  ECHO TEE 03/04/2019  Narrative TRANSESOPHOGEAL ECHO REPORT    Patient Name:   ELIH BENNETTE Date of Exam: 03/04/2019 Medical Rec #:  [id number]         Height:       77.0 in Accession #:    [id number]        Weight:       329.0 lb Date of Birth:  1955-08-20        BSA:          2.76 m Patient Age:    62 years          BP:           144/101 mmHg Patient Gender: M                 HR:           123 bpm. Exam Location:  Inpatient   Procedure: Transesophageal Echo, Cardiac Doppler and Color Doppler  Indications:     Afib  History:         Patient has prior history of Echocardiogram examinations, most recent 03/31/2017. Atrial Fibrillation Risk Factors: Hypertension, Obesity and Sleep Apnea.  Sonographer:     Lavenia Atlas Referring Phys:  [id number] BRIAN S CRENSHAW Diagnosing Phys: [name] MD    PROCEDURE: The transesophogeal probe was passed through the esophogus of the patient. The patient  developed no complications during the procedure.  IMPRESSIONS   1. The left ventricle has normal systolic function, with an ejection fraction of 55-60%. No evidence of left ventricular regional wall motion abnormalities. 2. The right ventricle has normal systolc function. The cavity was normal. 3. Left atrial size was severely dilated. 4. No evidence of a thrombus present in the left atrial appendage. 5. The mitral valve is abnormal. Mild thickening of the mitral valve leaflet. Mitral valve regurgitation is moderate to severe by color flow Doppler. 6. The aortic valve is tricuspid No stenosis of the aortic valve. 7. There is evidence of mild plaque in the descending aorta. 8. Normal LV systolic function; severe LAE with no LAA thrombus; mild RAE; moderately severe (3+) MR; mild TR.  FINDINGS Left Ventricle: The left ventricle has normal systolic function, with an ejection fraction of 55-60%. No evidence of left  ventricular regional wall motion abnormalities.  Right Ventricle: The right ventricle has normal systolic function. The cavity was normal.  Left Atrium: Left atrial size was severely dilated.   Left Atrial Appendage: No evidence of a thrombus present in the left atrial appendage.  Right Atrium: Right atrial size was mildly dilated.  Interatrial Septum: No atrial level shunt detected by color flow Doppler.  Pericardium: There is no evidence of pericardial effusion.  Mitral Valve: The mitral valve is abnormal. Mild thickening of the mitral valve leaflet. Mitral valve regurgitation is moderate to severe by color flow Doppler.  Tricuspid Valve: The tricuspid valve was normal in structure. Tricuspid valve regurgitation is mild by color flow Doppler.  Aortic Valve: The aortic valve is tricuspid Aortic valve regurgitation was not visualized by color flow Doppler. There is No stenosis of the aortic valve.  Pulmonic Valve: The pulmonic valve was grossly normal. Pulmonic valve regurgitation is not visualized by color flow Doppler.  Aorta: There is evidence of mild plaque in the descending aorta.  Additional Findings: Normal LV systolic function; severe LAE with no LAA thrombus; mild RAE; moderately severe (3+) MR; mild TR.   [name] MD Electronically signed by [name] MD Signature Date/Time: 03/04/2019/10:21:22 AM    Final           Recent Labs: 08/13/2022: B Natriuretic Peptide 74.5; TSH 1.144 08/14/2022: ALT 55 08/16/2022: Magnesium 1.7 08/18/2022: Hemoglobin 9.8; Platelets 242 11/28/2022: BUN 32; Creatinine, Ser 1.79; Potassium 3.9; Sodium 136  Recent Lipid Panel    Component Value Date/Time   CHOL  02/14/2009 0630    126        ATP III CLASSIFICATION:  <200     mg/dL   Desirable  161-096  mg/dL   Borderline High  >=045    mg/dL   High          TRIG 95 02/14/2009 0630   HDL 48 02/14/2009 0630   CHOLHDL 2.6 02/14/2009 0630   VLDL 19 02/14/2009 0630   LDLCALC   02/14/2009 0630    59        Total Cholesterol/HDL:CHD Risk Coronary Heart Disease Risk Table                     Men   Women  1/2 Average Risk   3.4   3.3  Average Risk       5.0   4.4  2 X Average Risk   9.6   7.1  3 X Average Risk  23.4   11.0        Use the calculated  Patient Ratio above and the CHD Risk Table to determine the patient's CHD Risk.        ATP III CLASSIFICATION (LDL):  <100     mg/dL   Optimal  161-096  mg/dL   Near or Above                    Optimal  130-159  mg/dL   Borderline  045-409  mg/dL   High  >811     mg/dL   Very High    History of Present Illness    67 year old male with the above past medical history including permanent atrial fibrillation, cardiomyopathy with improved EF, chronic diastolic heart failure, mild to moderate mitral valve regurgitation, hypertension, hyperlipidemia, CKD stage III-IV, and obesity.   Cardiac catheterization in 2004 showed EF 45%, normal coronary arteries.  He had atrial fibrillation at the time, LV function improved with restoration of sinus rhythm.  He ultimately went atrial fibrillation ablation.  He did well for several years but then had recurrent atrial fibrillation.  TEE in August 2020 showed normal LV function, severe left atrial enlargement, moderate to severe mitral valve regurgitation, mild tricuspid valve regurgitation.  He underwent DCCV in 02/2019 with early recurrence of atrial fibrillation.  Repeat DCCV in December 2020 was also unsuccessful.  He was not felt to be a candidate for repeat ablation given severe left atrial enlargement, obesity.  He was evaluated by CT surgery for consideration of surgical maze procedure, medical therapy was ultimately advised.  Echocardiogram in 03/2021 showed normal LV function, mild LVH, mild RV, mild LAE, mild to moderate MR. At his follow-up visit in 03/2022 he noted worsening dyspnea on exertion, chronic pedal edema.  His torsemide was increased to 60 mg twice daily with an  additional 20 mg as needed for worsening edema.  Hydralazine was increased to 100 mg 3 times daily.  He was hospitalized in October 2023 in the setting of acute on chronic diastolic heart failure secondary to urinary retention due to enlarged prostate, AKI on CKD.  Urology and nephrology were consulted.  Repeat echocardiogram showed EF 60 to 65%, normal LV function, no RWMA, mild LVH, normal RV systolic function, mildly dilated left atrium, mild mitral valve regurgitation.  He was hospitalized in February 2024 in the setting of AKI on CKD stage IV (baseline creatinine 3.5-4.5), UTI with sepsis, uremia. He was discharged with a foley catheter, with subsequent improvement in his renal function.  His wife contacted our office on 11/04/2022 with complaints of worsening shortness of breath, orthopnea.  Torsemide was increased to 60 mg daily x 3 days followed by torsemide 40 mg daily. He noted significant improvement in his lower extremity edema loss with this change as well as adherence to sodium and fluid recommendations.  He was last seen in the office on 11/28/2022 and noted an 11 pound weight gain, in the setting of nonadherence to medication regimen, dietary indiscretion.  He noted increased bilateral lower extremity edema, worsening dyspnea on exertion.  Torsemide was increased to 60 mg daily x 1 week.   He presents today for follow-up and for preoperative cardiac evaluation for upper ablation of the prostate on 02/20/2023 with Dr. Mena Goes of [facility] with request to hold Eliquis prior to procedure. Since his last visit he has   1. Chronic diastolic heart failure/cardiomyopathy: Prior EF 45%. Cath at the time showed normal coronaries. Most recent echo in 04/2022 showed EF 60 to 65%, normal LV function, no RWMA, mild  LVH, normal RV systolic function, mildly dilated left atrium, mild mitral valve regurgitation.  Recent dyspnea, orthopnea, 30 pound weight gain over the past 2 months.  Symptoms improved  dramatically with increased torsemide, adherence to sodium and fluid recommendations.  His weight was down 16 pounds, but is now up 11 pounds since his last visit.  He notes worsening dyspnea on exertion, increased bilateral lower extremity edema.  Will check BMET today.  Will increase torsemide to 60 mg daily.  Will plan for repeat labs at next follow-up visit.  Will once again plan for close follow-up to encourage adherence.  Reinforced daily weights, sodium and fluid recommendations.  Continue carvedilol, torsemide as above.   2. Permanent atrial fibrillation: Rate controlled. Continue diltiazem, carvedilol, Eliquis.    3. Mitral valve regurgitation: Mild on most recent echo.    4. Hypertension: BP well controlled. Continue current antihypertensive regimen.    5. CKD stage III-IV: Creatinine rose to 10.08 during hospitalization in 08/2022 (likely exacerbated by acute urinary retention, bladder outlet obstruction). Renal function improved significantly with placement of Foley catheter. Following with urology and nephrology.  Creatinine was 1.74 on 11/12/2022.  Repeat BMET pending.  Continue to monitor renal function closely with increased diuretic as above.   6. Obesity: Activity has been limited in the setting of acute on chronic heart failure, multiple hospitalizations.  He has been walking for exercise and notes he walked 1 mile earlier today.  Encouraged increased activity as tolerated.    7. History of snoring: Previously referred for sleep study, this was never completed. He declines sleep study at this time.   8. Preoperative cardiac exam: Per office protocol, patient can hold Eliquis for 2-3 days prior to procedure.     9. Disposition: Follow-up in  Home Medications    Current Outpatient Medications  Medication Sig Dispense Refill   carvedilol (COREG) 25 MG tablet Take 1 tablet (25 mg total) by mouth 2 (two) times daily. 180 tablet 3   clonazePAM (KLONOPIN) 0.5 MG tablet Take 0.5 mg by  mouth at bedtime.     diltiazem (CARDIZEM CD) 360 MG 24 hr capsule TAKE 1 CAPSULE(360 MG) BY MOUTH DAILY 90 capsule 0   DULoxetine (CYMBALTA) 60 MG capsule Take 60 mg by mouth 2 (two) times daily.     ELIQUIS 5 MG TABS tablet TAKE 1 TABLET BY MOUTH TWICE DAILY 60 tablet 5   finasteride (PROSCAR) 5 MG tablet Take 1 tablet (5 mg total) by mouth daily. 30 tablet 0   methocarbamol (ROBAXIN) 750 MG tablet Take 750 mg by mouth every 6 (six) hours as needed for muscle spasms.     omeprazole (PRILOSEC) 20 MG capsule Take 20 mg by mouth daily as needed (for acid reflux).     QUEtiapine (SEROQUEL) 25 MG tablet Take 1/2 tablet (12.5 mg total) by mouth at bedtime. 20 tablet 0   rOPINIRole (REQUIP) 1 MG tablet Take 1-2 tablets (1-2 mg total) by mouth at bedtime as needed (restless leg syndrome). 1 tablet after lunch and 2 tablet at bedtime 120 tablet 0   tamsulosin (FLOMAX) 0.4 MG CAPS capsule Take 1 capsule (0.4 mg total) by mouth daily after supper. 30 capsule 1   torsemide (DEMADEX) 20 MG tablet Take 2 tablets (40 mg total) by mouth daily. (Patient taking differently: Take 60 mg by mouth daily. 60 mg daily) 180 tablet 3   TYLENOL 500 MG tablet Take 1,000 mg by mouth every 6 (six) hours as needed for mild pain  or headache.     No current facility-administered medications for this visit.     Review of Systems    ***.  All other systems reviewed and are otherwise negative except as noted above.    Physical Exam    VS:  There were no vitals taken for this visit. , BMI There is no height or weight on file to calculate BMI. STOP-Bang Score:  6  { Consider Dx Sleep Disordered Breathing or Sleep Apnea  ICD G47.33          :1}    GEN: Well nourished, well developed, in no acute distress. HEENT: normal. Neck: Supple, no JVD, carotid bruits, or masses. Cardiac: RRR, no murmurs, rubs, or gallops. No clubbing, cyanosis, edema.  Radials/DP/PT 2+ and equal bilaterally.  Respiratory:  Respirations regular and  unlabored, clear to auscultation bilaterally. GI: Soft, nontender, nondistended, BS + x 4. MS: no deformity or atrophy. Skin: warm and dry, no rash. Neuro:  Strength and sensation are intact. Psych: Normal affect.  Accessory Clinical Findings    ECG personally reviewed by me today -    - no acute changes.   Lab Results  Component Value Date   WBC 21.5 (H) 08/18/2022   HGB 9.8 (L) 08/18/2022   HCT 31.4 (L) 08/18/2022   MCV 85.8 08/18/2022   PLT 242 08/18/2022   Lab Results  Component Value Date   CREATININE 1.79 (H) 11/28/2022   BUN 32 (H) 11/28/2022   NA 136 11/28/2022   K 3.9 11/28/2022   CL 104 11/28/2022   CO2 17 (L) 11/28/2022   Lab Results  Component Value Date   ALT 55 (H) 08/14/2022   AST 38 08/14/2022   ALKPHOS 90 08/14/2022   BILITOT 0.7 08/14/2022   Lab Results  Component Value Date   CHOL  02/14/2009    126        ATP III CLASSIFICATION:  <200     mg/dL   Desirable  161-096  mg/dL   Borderline High  >=045    mg/dL   High          HDL 48 02/14/2009   LDLCALC  02/14/2009    59        Total Cholesterol/HDL:CHD Risk Coronary Heart Disease Risk Table                     Men   Women  1/2 Average Risk   3.4   3.3  Average Risk       5.0   4.4  2 X Average Risk   9.6   7.1  3 X Average Risk  23.4   11.0        Use the calculated Patient Ratio above and the CHD Risk Table to determine the patient's CHD Risk.        ATP III CLASSIFICATION (LDL):  <100     mg/dL   Optimal  409-811  mg/dL   Near or Above                    Optimal  130-159  mg/dL   Borderline  914-782  mg/dL   High  >956     mg/dL   Very High   TRIG 95 21/30/8657   CHOLHDL 2.6 02/14/2009    Lab Results  Component Value Date   HGBA1C 6.5 (H) 04/19/2021    Assessment & Plan    1.  ***  No BP recorded.  {  Refresh Note OR Click here to enter BP  :1}***   Joylene Grapes, NP 01/07/2023, 6:05 AM

## 2023-01-14 NOTE — Progress Notes (Deleted)
HPI:  FU atrial fibrillation. The patient had a cardiac catheterization in 2004 that showed an ejection fraction of 45% and normal coronary arteries. He did have atrial fibrillation at that time and his LV function improved after sinus rhythm was restored. He ultimately had atrial fibrillation ablation. He did well for several years but then recurrent atrial fibrillation. Transesophageal echocardiogram August 2020 showed normal LV function, severe left atrial enlargement, moderate to severe mitral regurgitation and mild tricuspid regurgitation. Patient had cardioversion March 04, 2019 but atrial fibrillation recurred. Patient seen by Dr. Johney Frame and repeat ablation felt not indicated as chances of maintaining sinus would be lower given obesity and severe left atrial enlargement.  Plan was to potentially consider referral for surgical Maze procedure and mitral valve repair. Follow-up transthoracic echocardiogram October 2020 showed normal LV function, moderate left atrial enlargement and trace mitral regurgitation. Patient subsequently placed on amiodarone in the atrial fibrillation clinic. Also referred for evaluation of sleep apnea. Repeat attempt at cardioversion December 2020 unsuccessful. Dr. Cornelius Moras evaluated patient for consideration of surgical maze procedure and felt that medical therapy was best option. Most recent echocardiogram October 2023 showed normal LV function, mild left ventricular hypertrophy, mild left atrial enlargement, mild mitral regurgitation.  Abdominal CT February 2024 showed severe bilateral hydrourureteronephrosis unchanged from previous exam.  Patient has also developed progressive renal insufficiency and hydronephrosis.  Patient admitted with acute on chronic renal failure February 2024 with creatinine over 10 which improved with Foley catheter placement.  Since last seen,   Current Outpatient Medications  Medication Sig Dispense Refill   carvedilol (COREG) 25 MG tablet Take 1  tablet (25 mg total) by mouth 2 (two) times daily. 180 tablet 3   clonazePAM (KLONOPIN) 0.5 MG tablet Take 0.5 mg by mouth at bedtime.     diltiazem (CARDIZEM CD) 360 MG 24 hr capsule TAKE 1 CAPSULE(360 MG) BY MOUTH DAILY 90 capsule 0   DULoxetine (CYMBALTA) 60 MG capsule Take 60 mg by mouth 2 (two) times daily.     ELIQUIS 5 MG TABS tablet TAKE 1 TABLET BY MOUTH TWICE DAILY 60 tablet 5   finasteride (PROSCAR) 5 MG tablet Take 1 tablet (5 mg total) by mouth daily. 30 tablet 0   methocarbamol (ROBAXIN) 750 MG tablet Take 750 mg by mouth every 6 (six) hours as needed for muscle spasms.     omeprazole (PRILOSEC) 20 MG capsule Take 20 mg by mouth daily as needed (for acid reflux).     QUEtiapine (SEROQUEL) 25 MG tablet Take 1/2 tablet (12.5 mg total) by mouth at bedtime. 20 tablet 0   rOPINIRole (REQUIP) 1 MG tablet Take 1-2 tablets (1-2 mg total) by mouth at bedtime as needed (restless leg syndrome). 1 tablet after lunch and 2 tablet at bedtime 120 tablet 0   tamsulosin (FLOMAX) 0.4 MG CAPS capsule Take 1 capsule (0.4 mg total) by mouth daily after supper. 30 capsule 1   torsemide (DEMADEX) 20 MG tablet Take 2 tablets (40 mg total) by mouth daily. (Patient taking differently: Take 60 mg by mouth daily. 60 mg daily) 180 tablet 3   TYLENOL 500 MG tablet Take 1,000 mg by mouth every 6 (six) hours as needed for mild pain or headache.     No current facility-administered medications for this visit.     Past Medical History:  Diagnosis Date   Allergic rhinitis    Anxiety    Asthma    as a child   Chronic low back  pain    Complication of anesthesia    "hard to put asleep, hard to wake up, nausea and vomiting"   Depression    ED (erectile dysfunction)    GERD (gastroesophageal reflux disease)    HLD (hyperlipidemia)    HTN (hypertension)    sees Dr. Elias Else, [facility]   Hypertrophy of prostate with urinary obstruction and other lower urinary tract symptoms (LUTS)    IBS  (irritable bowel syndrome)    Inguinal hernia without mention of obstruction or gangrene, unilateral or unspecified, (not specified as recurrent)    Insomnia    Lumbar herniated disc    L7   Morbid obesity (HCC)    Narcotic addiction (HCC)    NICM (nonischemic cardiomyopathy) (HCC)    tachycardia mediated,  resolved with sinus   Persistent atrial fibrillation (HCC)    ablation done 03/2006 at [facility]   Pneumonia    hx of 2004   PONV (postoperative nausea and vomiting)    Twitching    legs    Past Surgical History:  Procedure Laterality Date   ATRIAL ABLATION SURGERY  2007   [facility]   CARDIOVERSION  08/11/2011   Procedure: CARDIOVERSION;  Surgeon: Lewayne Bunting, MD;  Location: [facility] OR;  Service: Cardiovascular;  Laterality: N/A;   CARDIOVERSION N/A 04/16/2017   Procedure: CARDIOVERSION;  Surgeon: Thurmon Fair, MD;  Location: [facility] ENDOSCOPY;  Service: Cardiovascular;  Laterality: N/A;   CARDIOVERSION N/A 03/04/2019   Procedure: CARDIOVERSION;  Surgeon: Lewayne Bunting, MD;  Location: [facility] ENDOSCOPY;  Service: Cardiovascular;  Laterality: N/A;   CARDIOVERSION N/A 07/04/2019   Procedure: CARDIOVERSION;  Surgeon: Chilton Si, MD;  Location: [facility] ENDOSCOPY;  Service: Cardiovascular;  Laterality: N/A;   EYE SURGERY     lasik, 1998   HERNIA REPAIR     1977   INGUINAL HERNIA REPAIR Right 08/26/2012   Procedure: LAPAROSCOPIC INGUINAL HERNIA;  Surgeon: Lodema Pilot, DO;  Location: [facility] OR;  Service: General;  Laterality: Right;  laparoscopic right inguinal hernia repair with mesh, umbilical hernia repair   INSERTION OF MESH Right 08/26/2012   Procedure: INSERTION OF MESH;  Surgeon: Lodema Pilot, DO;  Location: [facility] OR;  Service: General;  Laterality: Right;  right inguinal hernia   IR FLUORO GUIDE CV LINE RIGHT  08/15/2022   IR US GUIDE VASC ACCESS RIGHT  08/15/2022   TEE WITHOUT CARDIOVERSION N/A 03/04/2019   Procedure: TRANSESOPHAGEAL ECHOCARDIOGRAM (TEE);  Surgeon: Lewayne Bunting, MD;  Location:  [facility] ENDOSCOPY;  Service: Cardiovascular;  Laterality: N/A;   UMBILICAL HERNIA REPAIR N/A 08/26/2012   Procedure: HERNIA REPAIR UMBILICAL ADULT;  Surgeon: Lodema Pilot, DO;  Location: [facility] OR;  Service: General;  Laterality: N/A;    Social History   Socioeconomic History   Marital status: Married    Spouse name: Tayquan Gassman   Number of children: 3   Years of education: Not on file   Highest education level: Bachelor's degree (e.g., BA, AB, BS)  Occupational History   Occupation: retired  Tobacco Use   Smoking status: Never   Smokeless tobacco: Never  Vaping Use   Vaping Use: Never used  Substance and Sexual Activity   Alcohol use: No    Alcohol/week: 0.0 standard drinks of alcohol   Drug use: No   Sexual activity: Yes  Other Topics Concern   Not on file  Social History Narrative   Lives in [place]   Social Determinants of Health   Financial Resource Strain: Low Risk  (  04/17/2021)   Overall Financial Resource Strain (CARDIA)    Difficulty of Paying Living Expenses: Not hard at all  Food Insecurity: No Food Insecurity (08/18/2022)   Hunger Vital Sign    Worried About Running Out of Food in the Last Year: Never true    Ran Out of Food in the Last Year: Never true  Transportation Needs: No Transportation Needs (08/18/2022)   PRAPARE - Administrator, Civil Service (Medical): No    Lack of Transportation (Non-Medical): No  Physical Activity: Not on file  Stress: Not on file  Social Connections: Not on file  Intimate Partner Violence: Not At Risk (08/18/2022)   Humiliation, Afraid, Rape, and Kick questionnaire    Fear of Current or Ex-Partner: No    Emotionally Abused: No    Physically Abused: No    Sexually Abused: No    Family History  Problem Relation Age of Onset   Asthma Mother    Breast cancer Mother    Hypertension Mother    COPD Father    Prostate cancer Father    Hypertension Father    Lymphoma Sister     ROS: no fevers or chills,  productive cough, hemoptysis, dysphasia, odynophagia, melena, hematochezia, dysuria, hematuria, rash, seizure activity, orthopnea, PND, pedal edema, claudication. Remaining systems are negative.  Physical Exam: Well-developed well-nourished in no acute distress.  Skin is warm and dry.  HEENT is normal.  Neck is supple.  Chest is clear to auscultation with normal expansion.  Cardiovascular exam is regular rate and rhythm.  Abdominal exam nontender or distended. No masses palpated. Extremities show no edema. neuro grossly intact  ECG- personally reviewed  A/P  1 permanent atrial fibrillation-will continue Cardizem and carvedilol for rate control.  Continue apixaban.  2 chronic diastolic congestive heart failure-  3 chronic stage IV kidney disease-  4 hypertension-patient's blood pressure is controlled.  Continue present medical regimen.  5 history of mitral regurgitation-most recent echocardiogram showed mild MR.  6 history of cardiomyopathy-LV function has normalized on most recent echocardiogram.  7 obesity-we again discussed the importance of weight loss.  8 snoring-patient declined sleep study.  9 preoperative evaluation prior to TURP-  [name], MD

## 2023-01-26 ENCOUNTER — Ambulatory Visit: Payer: Medicare Other | Attending: Cardiology | Admitting: Cardiology

## 2023-01-27 ENCOUNTER — Telehealth: Payer: Self-pay | Admitting: Student

## 2023-01-27 MED ORDER — TORSEMIDE 20 MG PO TABS: 40.0000 mg | ORAL_TABLET | Freq: Every day | ORAL | 3 refills | Status: DC

## 2023-01-27 NOTE — Telephone Encounter (Signed)
Pt's medication was sent to pt's pharmacy as requested. Confirmation received.  °

## 2023-01-27 NOTE — Telephone Encounter (Signed)
*  STAT* If patient is at the pharmacy, call can be transferred to refill team.   1. Which medications need to be refilled? (please list name of each medication and dose if known) new prescription and new directions for his Torsemide   2. Would you like to learn more about the convenience, safety, & potential cost savings by using the [facility]?     3. Are you open to using the [facility] (Type [facility]. .   4. Which pharmacy/location (including street and city if local pharmacy) is medication to be sent to?[facility] [address], [place]   5. Do they need a 30 day or 90 day supply? 90 days and refills- need this called in today- completely out

## 2023-02-04 NOTE — Patient Instructions (Signed)
DUE TO COVID-19 ONLY TWO VISITORS  (aged 66 and older)  ARE ALLOWED TO COME WITH YOU AND STAY IN THE WAITING ROOM ONLY DURING PRE OP AND PROCEDURE.   **NO VISITORS ARE ALLOWED IN THE SHORT STAY AREA OR RECOVERY ROOM!!**  IF YOU WILL BE ADMITTED INTO THE HOSPITAL YOU ARE ALLOWED ONLY FOUR SUPPORT PEOPLE DURING VISITATION HOURS ONLY (7 AM -8PM)   The support person(s) must pass our screening, gel in and out, and wear a mask at all times, including in the patient's room. Patients must also wear a mask when staff or their support person are in the room. Visitors GUEST BADGE MUST BE WORN VISIBLY  One adult visitor may remain with you overnight and MUST be in the room by 8 P.M.     Your procedure is scheduled on: 02/20/23   Report to [facility] Main Entrance    Report to admitting at : 7:15 AM   Call this number if you have problems the morning of surgery [phone]   Do not eat food or drink fluids :After Midnight.   FOLLOW ANY ADDITIONAL PRE OP INSTRUCTIONS YOU RECEIVED FROM YOUR SURGEON'S OFFICE!!!   Oral Hygiene is also important to reduce your risk of infection.                                    Remember - BRUSH YOUR TEETH THE MORNING OF SURGERY WITH YOUR REGULAR TOOTHPASTE  DENTURES WILL BE REMOVED PRIOR TO SURGERY PLEASE DO NOT APPLY "Poly grip" OR ADHESIVES!!!   Do NOT smoke after Midnight   Take these medicines the morning of surgery with A SIP OF WATER: duloxetine,carvedilol,diltiazem,omeprazole.Tylenol as needed.  DO NOT TAKE ANY ORAL DIABETIC MEDICATIONS DAY OF YOUR SURGERY  Bring CPAP mask and tubing day of surgery.                              You may not have any metal on your body including hair pins, jewelry, and body piercing             Do not wear lotions, powders, perfumes/cologne, or deodorant              Men may shave face and neck.   Do not bring valuables to the hospital. [facility] IS NOT             RESPONSIBLE   FOR  VALUABLES.   Contacts, glasses, or bridgework may not be worn into surgery.   Bring small overnight bag day of surgery.   DO NOT BRING YOUR HOME MEDICATIONS TO THE HOSPITAL. PHARMACY WILL DISPENSE MEDICATIONS LISTED ON YOUR MEDICATION LIST TO YOU DURING YOUR ADMISSION IN THE HOSPITAL!    Patients discharged on the day of surgery will not be allowed to drive home.  Someone NEEDS to stay with you for the first 24 hours after anesthesia.   Special Instructions: Bring a copy of your healthcare power of attorney and living will documents         the day of surgery if you haven't scanned them before.              Please read over the following fact sheets you were given: IF YOU HAVE QUESTIONS ABOUT YOUR PRE-OP INSTRUCTIONS PLEASE CALL [phone]    [facility] - Preparing for Surgery Before surgery, you can  play an important role.  Because skin is not sterile, your skin needs to be as free of germs as possible.  You can reduce the number of germs on your skin by washing with CHG (chlorahexidine gluconate) soap before surgery.  CHG is an antiseptic cleaner which kills germs and bonds with the skin to continue killing germs even after washing. Please DO NOT use if you have an allergy to CHG or antibacterial soaps.  If your skin becomes reddened/irritated stop using the CHG and inform your nurse when you arrive at Short Stay. Do not shave (including legs and underarms) for at least 48 hours prior to the first CHG shower.  You may shave your face/neck. Please follow these instructions carefully:  1.  Shower with CHG Soap the night before surgery and the  morning of Surgery.  2.  If you choose to wash your hair, wash your hair first as usual with your  normal  shampoo.  3.  After you shampoo, rinse your hair and body thoroughly to remove the  shampoo.                           4.  Use CHG as you would any other liquid soap.  You can apply chg directly  to the skin and wash                       Gently  with a scrungie or clean washcloth.  5.  Apply the CHG Soap to your body ONLY FROM THE NECK DOWN.   Do not use on face/ open                           Wound or open sores. Avoid contact with eyes, ears mouth and genitals (private parts).                       Wash face,  Genitals (private parts) with your normal soap.             6.  Wash thoroughly, paying special attention to the area where your surgery  will be performed.  7.  Thoroughly rinse your body with warm water from the neck down.  8.  DO NOT shower/wash with your normal soap after using and rinsing off  the CHG Soap.                9.  Pat yourself dry with a clean towel.            10.  Wear clean pajamas.            11.  Place clean sheets on your bed the night of your first shower and do not  sleep with pets. Day of Surgery : Do not apply any lotions/deodorants the morning of surgery.  Please wear clean clothes to the hospital/surgery center.  FAILURE TO FOLLOW THESE INSTRUCTIONS MAY RESULT IN THE CANCELLATION OF YOUR SURGERY PATIENT SIGNATURE_________________________________  NURSE SIGNATURE__________________________________  ________________________________________________________________________

## 2023-02-05 ENCOUNTER — Encounter (HOSPITAL_COMMUNITY): Payer: Self-pay

## 2023-02-05 ENCOUNTER — Encounter (HOSPITAL_COMMUNITY)
Admission: RE | Admit: 2023-02-05 | Discharge: 2023-02-05 | Disposition: A | Discharge status: Home / Self Care | Payer: Medicare Other | Source: Ambulatory Visit | Attending: Urology | Admitting: Urology

## 2023-02-05 ENCOUNTER — Other Ambulatory Visit: Payer: Self-pay

## 2023-02-05 VITALS — BP 149/90 | HR 102 | Temp 97.8°F | Ht 75.0 in | Wt 302.0 lb

## 2023-02-05 DIAGNOSIS — Z01812 Encounter for preprocedural laboratory examination: Secondary | ICD-10-CM | POA: Insufficient documentation

## 2023-02-05 DIAGNOSIS — I10 Essential (primary) hypertension: Secondary | ICD-10-CM | POA: Diagnosis not present

## 2023-02-05 HISTORY — DX: Cellulitis, unspecified: L03.90

## 2023-02-05 HISTORY — DX: Sleep apnea, unspecified: G47.30

## 2023-02-05 HISTORY — DX: Cardiac arrhythmia, unspecified: I49.9

## 2023-02-05 HISTORY — DX: Chronic kidney disease, unspecified: N18.9

## 2023-02-05 LAB — CBC
HCT: 34 % — ABNORMAL LOW (ref 39.0–52.0)
Hemoglobin: 9.5 g/dL — ABNORMAL LOW (ref 13.0–17.0)
MCH: 24.1 pg — ABNORMAL LOW (ref 26.0–34.0)
MCHC: 27.9 g/dL — ABNORMAL LOW (ref 30.0–36.0)
MCV: 86.1 fL (ref 80.0–100.0)
Platelets: 337 10*3/uL (ref 150–400)
RBC: 3.95 MIL/uL — ABNORMAL LOW (ref 4.22–5.81)
RDW: 18.5 % — ABNORMAL HIGH (ref 11.5–15.5)
WBC: 10.2 10*3/uL (ref 4.0–10.5)
nRBC: 0 % (ref 0.0–0.2)

## 2023-02-05 LAB — BASIC METABOLIC PANEL
Anion gap: 8 (ref 5–15)
BUN: 36 mg/dL — ABNORMAL HIGH (ref 8–23)
CO2: 19 mmol/L — ABNORMAL LOW (ref 22–32)
Calcium: 8.6 mg/dL — ABNORMAL LOW (ref 8.9–10.3)
Chloride: 106 mmol/L (ref 98–111)
Creatinine, Ser: 2.19 mg/dL — ABNORMAL HIGH (ref 0.61–1.24)
GFR, Estimated: 33 mL/min — ABNORMAL LOW (ref >60)
Glucose, Bld: 101 mg/dL — ABNORMAL HIGH (ref 70–99)
Potassium: 4.9 mmol/L (ref 3.5–5.1)
Sodium: 133 mmol/L — ABNORMAL LOW (ref 135–145)

## 2023-02-05 NOTE — Progress Notes (Signed)
   02/05/23 1003  OBSTRUCTIVE SLEEP APNEA  Have you ever been diagnosed with sleep apnea through a sleep study? No  Do you snore loudly (loud enough to be heard through closed doors)?  1  Do you often feel tired, fatigued, or sleepy during the daytime (such as falling asleep during driving or talking to someone)? 1  Has anyone observed you stop breathing during your sleep? 0  Do you have, or are you being treated for high blood pressure? 1  BMI more than 35 kg/m2? 1  Age > 50 (1-yes) 1  Neck circumference greater than:Male 16 inches or larger, Male 17inches or larger? 1  Male Gender (Yes=1) 1  Obstructive Sleep Apnea Score 7  Score 5 or greater  Results sent to PCP

## 2023-02-05 NOTE — Progress Notes (Addendum)
For Short Stay: COVID SWAB appointment date:  Bowel Prep reminder: N/A   For Anesthesia: PCP - Dr. Johny Blamer Cardiologist - Dr. [name]. Clearance: Robin Searing: 02/01/23.  Chest x-ray - 04/24/22 EKG - 11/28/22 Stress Test -  ECHO - 04/25/22 Cardiac Cath -  Pacemaker/ICD device last checked: Pacemaker orders received: Device Rep notified:  Spinal Cord Stimulator: n/A  Sleep Study - N/A CPAP -   Fasting Blood Sugar - N/A Checks Blood Sugar _____ times a day Date and result of last Hgb A1c-  Last dose of GLP1 agonist- N/A GLP1 instructions:   Last dose of SGLT-2 inhibitors- N/A SGLT-2 instructions:   Blood Thinner Instructions: Eliquis last dose: 02/17/23 Aspirin Instructions: Last Dose:  Activity level: Can go up a flight of stairs and activities of daily living without stopping and without chest pain and/or shortness of breath   Able to exercise without chest pain and/or shortness of breath  Anesthesia review: Hx: HTN,Afib,CKD (40%),OSA(NO CPAP).  Patient denies shortness of breath, fever, cough and chest pain at PAT appointment   Patient verbalized understanding of instructions that were given to them at the PAT appointment. Patient was also instructed that they will need to review over the PAT instructions again at home before surgery.

## 2023-02-05 NOTE — Progress Notes (Signed)
For Short Stay: COVID SWAB appointment date:  Bowel Prep reminder: N/A   For Anesthesia: PCP - Dr. Johny Blamer. Cardiologist - Dr. [name]. Clearance: Robin Searing: NP: 02/01/23  Chest x-ray - 04/24/22 EKG - 11/28/22 Stress Test -  ECHO - 04/25/22 Cardiac Cath -  Pacemaker/ICD device last checked: Pacemaker orders received: Device Rep notified:  Spinal Cord Stimulator:  Sleep Study - N/A CPAP -   Fasting Blood Sugar - N/A Checks Blood Sugar _____ times a day Date and result of last Hgb A1c-  Last dose of GLP1 agonist- N/A GLP1 instructions:   Last dose of SGLT-2 inhibitors- N/A SGLT-2 instructions:   Blood Thinner Instructions: Eliquis will be on hold after 02/17/23. Aspirin Instructions: Last Dose:  Activity level: Can go up a flight of stairs and activities of daily living without stopping and without chest pain and/or shortness of breath   Able to exercise without chest pain and/or shortness of breath  Anesthesia review: Hx: HTN,OSA(NO CPAP),Afib,CKD(40%). Pt. Has bilateral lower legs cellulitis,his wife does daily dressing changes 3 x day.As per wife left leg is heal,she uses topical lidocaine,neosporin,benadryl to apply on.  Patient denies shortness of breath, fever, cough and chest pain at PAT appointment   Patient verbalized understanding of instructions that were given to them at the PAT appointment. Patient was also instructed that they will need to review over the PAT instructions again at home before surgery.

## 2023-02-05 NOTE — Progress Notes (Signed)
Lab. Results: hemoglobin: 9.5 Creatinine: 2.19

## 2023-02-11 ENCOUNTER — Telehealth: Payer: Self-pay | Admitting: Cardiology

## 2023-02-11 NOTE — Telephone Encounter (Signed)
Pam from [facility] is calling to follow up on the patient's clearance. Pam stated that the clearance they received was to hold the Eliquis, but they were wanting to have medical clearance as well. Please advise.

## 2023-02-12 NOTE — Telephone Encounter (Signed)
I called to s/w the pt about in office appt for pre op clearance. Pt's wife answered,she asked if I had called his cell phone. I stated that the number I called [phone] was listed as primary number. She tells me that is her # and she asked for [place] to not call her # first to try his cell [phone]. I will make pt's cell as primary # to call. Pt's wife did get the pt on the phone though and I was able to s/w the pt and he is agreeable to in office appt 02/13/23 with Bernadene Person, NP @ 1:55 at the [facility] office.   I will update all parties involved.

## 2023-02-12 NOTE — Telephone Encounter (Signed)
I will update the requesting office the pt had an appt for pre op clearance on 01/07/23 with Bernadene Person, NP, though pt cancelled the appt, then pt was supposed to see Dr. [name] 01/26/23 which pt did not show for the appt.   Pt is going to need to reschedule in office with Dr. [name] or APP. I will update the pre op APP as well as FYI.

## 2023-02-13 ENCOUNTER — Encounter: Payer: Self-pay | Admitting: Nurse Practitioner

## 2023-02-13 ENCOUNTER — Ambulatory Visit: Payer: Medicare Other | Attending: Nurse Practitioner | Admitting: Nurse Practitioner

## 2023-02-13 VITALS — BP 122/78 | HR 110 | Ht 76.0 in | Wt 299.0 lb

## 2023-02-13 DIAGNOSIS — R0683 Snoring: Secondary | ICD-10-CM | POA: Diagnosis present

## 2023-02-13 DIAGNOSIS — I340 Nonrheumatic mitral (valve) insufficiency: Secondary | ICD-10-CM | POA: Diagnosis not present

## 2023-02-13 DIAGNOSIS — Z01810 Encounter for preprocedural cardiovascular examination: Secondary | ICD-10-CM

## 2023-02-13 DIAGNOSIS — I5032 Chronic diastolic (congestive) heart failure: Secondary | ICD-10-CM | POA: Diagnosis not present

## 2023-02-13 DIAGNOSIS — I420 Dilated cardiomyopathy: Secondary | ICD-10-CM | POA: Diagnosis not present

## 2023-02-13 DIAGNOSIS — N184 Chronic kidney disease, stage 4 (severe): Secondary | ICD-10-CM | POA: Diagnosis not present

## 2023-02-13 MED ORDER — TORSEMIDE 20 MG PO TABS: ORAL_TABLET | ORAL | 3 refills | Status: DC

## 2023-02-13 NOTE — Progress Notes (Signed)
Office Visit    Patient Name: [name] Date of Encounter: 02/13/2023  Primary Care Provider:  Noberto Retort, MD Primary Cardiologist:  [name], MD  Chief Complaint    67 year old male with a history of permanent atrial fibrillation, cardiomyopathy with improved EF, chronic diastolic heart failure, mild to moderate mitral valve regurgitation, hypertension, hyperlipidemia, CKD stage III-IV, and obesity who presents for follow-up related to heart failure for preoperative cardiac evaluation.    Past Medical History    Past Medical History:  Diagnosis Date   Allergic rhinitis    Anxiety    Asthma    as a child   Cellulitis    Chronic kidney disease    Chronic low back pain    Complication of anesthesia    "hard to put asleep, hard to wake up, nausea and vomiting"   Depression    Dysrhythmia    ED (erectile dysfunction)    GERD (gastroesophageal reflux disease)    HLD (hyperlipidemia)    HTN (hypertension)    sees Dr. Elias Else, [facility]   Hypertrophy of prostate with urinary obstruction and other lower urinary tract symptoms (LUTS)    IBS (irritable bowel syndrome)    Inguinal hernia without mention of obstruction or gangrene, unilateral or unspecified, (not specified as recurrent)    Insomnia    Lumbar herniated disc    L7   Morbid obesity (HCC)    Narcotic addiction (HCC)    NICM (nonischemic cardiomyopathy) (HCC)    tachycardia mediated,  resolved with sinus   Persistent atrial fibrillation (HCC)    ablation done 03/2006 at [facility]   Pneumonia    hx of 2004   PONV (postoperative nausea and vomiting)    Sleep apnea    Twitching    legs   Past Surgical History:  Procedure Laterality Date   ATRIAL ABLATION SURGERY  2007   [facility]   CARDIOVERSION  08/11/2011   Procedure: CARDIOVERSION;  Surgeon: Lewayne Bunting, MD;  Location: [facility] OR;  Service: Cardiovascular;  Laterality: N/A;   CARDIOVERSION N/A 04/16/2017   Procedure: CARDIOVERSION;   Surgeon: Thurmon Fair, MD;  Location: [facility] ENDOSCOPY;  Service: Cardiovascular;  Laterality: N/A;   CARDIOVERSION N/A 03/04/2019   Procedure: CARDIOVERSION;  Surgeon: Lewayne Bunting, MD;  Location: [facility] ENDOSCOPY;  Service: Cardiovascular;  Laterality: N/A;   CARDIOVERSION N/A 07/04/2019   Procedure: CARDIOVERSION;  Surgeon: Chilton Si, MD;  Location: [facility] ENDOSCOPY;  Service: Cardiovascular;  Laterality: N/A;   EYE SURGERY     lasik, 1998   HERNIA REPAIR     1977   INGUINAL HERNIA REPAIR Right 08/26/2012   Procedure: LAPAROSCOPIC INGUINAL HERNIA;  Surgeon: Lodema Pilot, DO;  Location: [facility] OR;  Service: General;  Laterality: Right;  laparoscopic right inguinal hernia repair with mesh, umbilical hernia repair   INSERTION OF MESH Right 08/26/2012   Procedure: INSERTION OF MESH;  Surgeon: Lodema Pilot, DO;  Location: [facility] OR;  Service: General;  Laterality: Right;  right inguinal hernia   IR FLUORO GUIDE CV LINE RIGHT  08/15/2022   IR US GUIDE VASC ACCESS RIGHT  08/15/2022   TEE WITHOUT CARDIOVERSION N/A 03/04/2019   Procedure: TRANSESOPHAGEAL ECHOCARDIOGRAM (TEE);  Surgeon: Lewayne Bunting, MD;  Location: [facility] ENDOSCOPY;  Service: Cardiovascular;  Laterality: N/A;   UMBILICAL HERNIA REPAIR N/A 08/26/2012   Procedure: HERNIA REPAIR UMBILICAL ADULT;  Surgeon: Lodema Pilot, DO;  Location: [facility] OR;  Service: General;  Laterality: N/A;  Allergies  Allergies  Allergen Reactions   Baclofen Other (See Comments)    Severe dizziness   Doxycycline Hyclate Other (See Comments)    Flu-like symptoms   Gabapentin Swelling and Other (See Comments)    Swelling in feet   Hycodan [Hydrocodone Bit-Homatrop Mbr] Other (See Comments)    Dizziness    Hydrocodone-Acetaminophen Other (See Comments)    GI upset, Dizziness   Pregabalin Swelling and Other (See Comments)    Severly dry mouth     Labs/Other Studies Reviewed    The following studies were reviewed today:  Cardiac Studies & Procedures        ECHOCARDIOGRAM  ECHOCARDIOGRAM COMPLETE 04/25/2022  Narrative ECHOCARDIOGRAM REPORT    Patient Name:   [name] Date of Exam: 04/25/2022 Medical Rec #:  [id number]         Height:       76.0 in Accession #:    [id number]        Weight:       350.1 lb Date of Birth:  May 18, 1956        BSA:          2.810 m Patient Age:    65 years          BP:           169/102 mmHg Patient Gender: M                 HR:           90 bpm. Exam Location:  Inpatient  Procedure: 2D Echo, Color Doppler and Cardiac Doppler  Indications:    CHF- Acute Diastolic  History:        Patient has prior history of Echocardiogram examinations, most recent 04/02/2021. Arrythmias:Atrial Fibrillation; Risk Factors:Hypertension and Dyslipidemia.  Referring Phys: [id number] CAROLE N HALL  IMPRESSIONS   1. Left ventricular ejection fraction, by estimation, is 60 to 65%. The left ventricle has normal function. The left ventricle has no regional wall motion abnormalities. There is mild left ventricular hypertrophy. Left ventricular diastolic parameters are indeterminate. 2. Right ventricular systolic function is normal. The right ventricular size is normal. 3. Left atrial size was mildly dilated. 4. Mild mitral valve regurgitation. 5. The aortic valve is normal in structure. Aortic valve regurgitation is not visualized.  Comparison(s): The left ventricular function is unchanged compared to echo from September 2022.Marland Kitchen  FINDINGS Left Ventricle: Left ventricular ejection fraction, by estimation, is 60 to 65%. The left ventricle has normal function. The left ventricle has no regional wall motion abnormalities. The left ventricular internal cavity size was normal in size. There is mild left ventricular hypertrophy. Left ventricular diastolic parameters are indeterminate.  Right Ventricle: The right ventricular size is normal. Right vetricular wall thickness was not assessed. Right ventricular systolic function is  normal.  Left Atrium: Left atrial size was mildly dilated.  Right Atrium: Right atrial size was normal in size.  Pericardium: Trivial pericardial effusion is present.  Mitral Valve: There is mild thickening of the mitral valve leaflet(s). Mild mitral valve regurgitation.  Tricuspid Valve: The tricuspid valve is normal in structure. Tricuspid valve regurgitation is trivial.  Aortic Valve: The aortic valve is normal in structure. Aortic valve regurgitation is not visualized. Aortic valve mean gradient measures 4.0 mmHg. Aortic valve peak gradient measures 7.3 mmHg. Aortic valve area, by VTI measures 2.93 cm.  Pulmonic Valve: The pulmonic valve was normal in structure. Pulmonic valve regurgitation is not visualized.  Aorta: The aortic  root and ascending aorta are structurally normal, with no evidence of dilitation.  IAS/Shunts: The interatrial septum was not assessed.   LEFT VENTRICLE PLAX 2D LVIDd:         5.40 cm LVIDs:         3.70 cm LV PW:         1.20 cm LV IVS:        1.30 cm LVOT diam:     2.30 cm LV SV:         72 LV SV Index:   26 LVOT Area:     4.15 cm   RIGHT VENTRICLE TAPSE (M-mode): 1.8 cm  LEFT ATRIUM              Index        RIGHT ATRIUM           Index LA diam:        6.00 cm  2.13 cm/m   RA Area:     18.60 cm LA Vol (A2C):   124.5 ml 44.30 ml/m  RA Volume:   45.70 ml  16.26 ml/m LA Vol (A4C):   109.0 ml 38.79 ml/m LA Biplane Vol: 106.0 ml 37.72 ml/m AORTIC VALVE AV Area (Vmax):    3.23 cm AV Area (Vmean):   3.03 cm AV Area (VTI):     2.93 cm AV Vmax:           135.00 cm/s AV Vmean:          95.600 cm/s AV VTI:            0.247 m AV Peak Grad:      7.3 mmHg AV Mean Grad:      4.0 mmHg LVOT Vmax:         105.00 cm/s LVOT Vmean:        69.700 cm/s LVOT VTI:          0.174 m LVOT/AV VTI ratio: 0.70  AORTA Ao Root diam: 3.40 cm Ao Asc diam:  3.80 cm  MR Peak grad: 76.2 mmHg MR Vmax:      436.50 cm/s SHUNTS Systemic VTI:  0.17  m Systemic Diam: 2.30 cm  Dietrich Pates MD Electronically signed by Dietrich Pates MD Signature Date/Time: 04/25/2022/10:56:14 AM    Final   TEE  ECHO TEE 03/04/2019  Narrative TRANSESOPHOGEAL ECHO REPORT    Patient Name:   [name] Date of Exam: 03/04/2019 Medical Rec #:  [id number]         Height:       77.0 in Accession #:    [id number]        Weight:       329.0 lb Date of Birth:  1955-09-28        BSA:          2.76 m Patient Age:    62 years          BP:           144/101 mmHg Patient Gender: M                 HR:           123 bpm. Exam Location:  Inpatient   Procedure: Transesophageal Echo, Cardiac Doppler and Color Doppler  Indications:     Afib  History:         Patient has prior history of Echocardiogram examinations, most recent 03/31/2017. Atrial Fibrillation Risk Factors: Hypertension, Obesity and Sleep Apnea.  Sonographer:  Lavenia Atlas Referring Phys:  [id number] BRIAN S CRENSHAW Diagnosing Phys: [name] MD    PROCEDURE: The transesophogeal probe was passed through the esophogus of the patient. The patient developed no complications during the procedure.  IMPRESSIONS   1. The left ventricle has normal systolic function, with an ejection fraction of 55-60%. No evidence of left ventricular regional wall motion abnormalities. 2. The right ventricle has normal systolc function. The cavity was normal. 3. Left atrial size was severely dilated. 4. No evidence of a thrombus present in the left atrial appendage. 5. The mitral valve is abnormal. Mild thickening of the mitral valve leaflet. Mitral valve regurgitation is moderate to severe by color flow Doppler. 6. The aortic valve is tricuspid No stenosis of the aortic valve. 7. There is evidence of mild plaque in the descending aorta. 8. Normal LV systolic function; severe LAE with no LAA thrombus; mild RAE; moderately severe (3+) MR; mild TR.  FINDINGS Left Ventricle: The left ventricle has  normal systolic function, with an ejection fraction of 55-60%. No evidence of left ventricular regional wall motion abnormalities.  Right Ventricle: The right ventricle has normal systolic function. The cavity was normal.  Left Atrium: Left atrial size was severely dilated.   Left Atrial Appendage: No evidence of a thrombus present in the left atrial appendage.  Right Atrium: Right atrial size was mildly dilated.  Interatrial Septum: No atrial level shunt detected by color flow Doppler.  Pericardium: There is no evidence of pericardial effusion.  Mitral Valve: The mitral valve is abnormal. Mild thickening of the mitral valve leaflet. Mitral valve regurgitation is moderate to severe by color flow Doppler.  Tricuspid Valve: The tricuspid valve was normal in structure. Tricuspid valve regurgitation is mild by color flow Doppler.  Aortic Valve: The aortic valve is tricuspid Aortic valve regurgitation was not visualized by color flow Doppler. There is No stenosis of the aortic valve.  Pulmonic Valve: The pulmonic valve was grossly normal. Pulmonic valve regurgitation is not visualized by color flow Doppler.  Aorta: There is evidence of mild plaque in the descending aorta.  Additional Findings: Normal LV systolic function; severe LAE with no LAA thrombus; mild RAE; moderately severe (3+) MR; mild TR.   [name] MD Electronically signed by [name] MD Signature Date/Time: 03/04/2019/10:21:22 AM    Final           Recent Labs: 08/13/2022: B Natriuretic Peptide 74.5; TSH 1.144 08/14/2022: ALT 55 08/16/2022: Magnesium 1.7 02/05/2023: BUN 36; Creatinine, Ser 2.19; Hemoglobin 9.5; Platelets 337; Potassium 4.9; Sodium 133  Recent Lipid Panel    Component Value Date/Time   CHOL  02/14/2009 0630    126        ATP III CLASSIFICATION:  <200     mg/dL   Desirable  960-454  mg/dL   Borderline High  >=098    mg/dL   High          TRIG 95 02/14/2009 0630   HDL 48 02/14/2009 0630    CHOLHDL 2.6 02/14/2009 0630   VLDL 19 02/14/2009 0630   LDLCALC  02/14/2009 0630    59        Total Cholesterol/HDL:CHD Risk Coronary Heart Disease Risk Table                     Men   Women  1/2 Average Risk   3.4   3.3  Average Risk       5.0   4.4  2 X Average Risk   9.6   7.1  3 X Average Risk  23.4   11.0        Use the calculated Patient Ratio above and the CHD Risk Table to determine the patient's CHD Risk.        ATP III CLASSIFICATION (LDL):  <100     mg/dL   Optimal  119-147  mg/dL   Near or Above                    Optimal  130-159  mg/dL   Borderline  829-562  mg/dL   High  >130     mg/dL   Very High    History of Present Illness    67 year old male with the above past medical history including permanent atrial fibrillation, cardiomyopathy with improved EF, chronic diastolic heart failure, mild to moderate mitral valve regurgitation, hypertension, hyperlipidemia, CKD stage III-IV, and obesity.   Cardiac catheterization in 2004 showed EF 45%, normal coronary arteries.  He had atrial fibrillation at the time, LV function improved with restoration of sinus rhythm.  He ultimately went atrial fibrillation ablation.  He did well for several years but then had recurrent atrial fibrillation.  TEE in August 2020 showed normal LV function, severe left atrial enlargement, moderate to severe mitral valve regurgitation, mild tricuspid valve regurgitation.  He underwent DCCV in 02/2019 with early recurrence of atrial fibrillation.  Repeat DCCV in December 2020 was also unsuccessful.  He was not felt to be a candidate for repeat ablation given severe left atrial enlargement, obesity.  He was evaluated by CT surgery for consideration of surgical maze procedure, medical therapy was ultimately advised.  Echocardiogram in 03/2021 showed normal LV function, mild LVH, mild RV, mild LAE, mild to moderate MR. At his follow-up visit in 03/2022 he noted worsening dyspnea on exertion, chronic pedal  edema.  His torsemide was increased to 60 mg twice daily with an additional 20 mg as needed for worsening edema.  Hydralazine was increased to 100 mg 3 times daily.  He was hospitalized in October 2023 in the setting of acute on chronic diastolic heart failure secondary to urinary retention due to enlarged prostate, AKI on CKD.  Urology and nephrology were consulted.  Repeat echocardiogram showed EF 60 to 65%, normal LV function, no RWMA, mild LVH, normal RV systolic function, mildly dilated left atrium, mild mitral valve regurgitation.  He was hospitalized in February 2024 in the setting of AKI on CKD stage IV (baseline creatinine 3.5-4.5), UTI with sepsis, uremia. He was discharged with a foley catheter, with subsequent improvement in his renal function.  His wife contacted our office on 11/04/2022 with complaints of worsening shortness of breath, orthopnea.  Torsemide was increased to 60 mg daily x 3 days followed by torsemide 40 mg daily. He noted significant improvement in his lower extremity edema loss with this change as well as adherence to sodium and fluid recommendations.  He was last seen in the office on 11/28/2022 and noted an 11 pound weight gain, in the setting of nonadherence to medication regimen, dietary indiscretion.  He noted increased bilateral lower extremity edema, worsening dyspnea on exertion. Torsemide was increased to 60 mg daily x 1 week.   He presents today for follow-up and for preoperative cardiac evaluation for upper ablation of the prostate on 02/20/2023 with Dr. Mena Goes of [facility] with request to hold Eliquis prior to procedure. Since his last visit he has been stable overall from  a cardiac standpoint. He continues to have bilateral lower extremity edema, chronic weeping to his legs bilaterally.  He continues to dress his legs with Unna boots. He denies chest pain, worsening dyspnea, PND, orthopnea, weight gain.  He denies dietary indiscretion.    Home Medications     Current Outpatient Medications  Medication Sig Dispense Refill   alfuzosin (UROXATRAL) 10 MG 24 hr tablet Take 10 mg by mouth daily with breakfast.     carvedilol (COREG) 25 MG tablet Take 1 tablet (25 mg total) by mouth 2 (two) times daily. (Patient taking differently: Take 25 mg by mouth daily.) 180 tablet 3   clonazePAM (KLONOPIN) 0.5 MG tablet Take 0.25-0.5 mg by mouth at bedtime as needed for anxiety.     diltiazem (CARDIZEM CD) 360 MG 24 hr capsule TAKE 1 CAPSULE(360 MG) BY MOUTH DAILY (Patient taking differently: Take 360 mg by mouth at bedtime.) 90 capsule 0   DULoxetine (CYMBALTA) 60 MG capsule Take 60 mg by mouth 2 (two) times daily.     ELIQUIS 5 MG TABS tablet TAKE 1 TABLET BY MOUTH TWICE DAILY 60 tablet 5   ferrous sulfate 325 (65 FE) MG tablet Take 325 mg by mouth daily.     finasteride (PROSCAR) 5 MG tablet Take 1 tablet (5 mg total) by mouth daily. 30 tablet 0   Multiple Vitamins-Minerals (ZINC PO) Take 1 tablet by mouth daily.     omeprazole (PRILOSEC) 20 MG capsule Take 20 mg by mouth daily as needed (for acid reflux).     rOPINIRole (REQUIP) 1 MG tablet Take 1-2 tablets (1-2 mg total) by mouth at bedtime as needed (restless leg syndrome). 1 tablet after lunch and 2 tablet at bedtime (Patient taking differently: Take 3 mg by mouth at bedtime.) 120 tablet 0   TYLENOL 500 MG tablet Take 1,500 mg by mouth as needed for mild pain or headache.     POTASSIUM PO Take 1 tablet by mouth daily. (Patient not taking: Reported on 02/13/2023)     torsemide (DEMADEX) 20 MG tablet Take 40 mg daily. May take an additional tablet on the days of weight gain or swelling. 90 tablet 3   Turmeric (QC TUMERIC COMPLEX PO) Take 1 capsule by mouth daily. (Patient not taking: Reported on 02/13/2023)     No current facility-administered medications for this visit.     Review of Systems    He denies chest pain, palpitations, dyspnea, pnd, orthopnea, n, v, dizziness, syncope, weight gain, or early satiety.  All other systems reviewed and are otherwise negative except as noted above.   Physical Exam    VS:  BP 122/78   Pulse (!) 110   Ht 6\' 4"  (1.93 m)   Wt 299 lb (135.6 kg)   BMI 36.40 kg/m  , BMI Body mass index is 36.4 kg/m. STOP-Bang Score:  6      GEN: Well nourished, well developed, in no acute distress. HEENT: normal. Neck: Supple, no JVD, carotid bruits, or masses. Cardiac: RRR, no murmurs, rubs, or gallops. No clubbing, cyanosis, edema.  Radials/DP/PT 2+ and equal bilaterally.  Respiratory:  Respirations regular and unlabored, clear to auscultation bilaterally. GI: Soft, nontender, nondistended, BS + x 4. MS: no deformity or atrophy. Skin: warm and dry, no rash. Neuro:  Strength and sensation are intact. Psych: Normal affect.  Accessory Clinical Findings    ECG personally reviewed by me today - EKG Interpretation Date/Time:  Friday February 13 2023 13:54:42 EDT Ventricular Rate:  110  PR Interval:    QRS Duration:  98 QT Interval:  306 QTC Calculation: 414 R Axis:   45  Text Interpretation: Atrial fibrillation with rapid ventricular response When compared with ECG of 13-Aug-2022 16:33, No significant change was found Confirmed by Bernadene Person ([id number]) on 02/13/2023 2:02:00 PM  - no acute changes.   Lab Results  Component Value Date   WBC 10.2 02/05/2023   HGB 9.5 (L) 02/05/2023   HCT 34.0 (L) 02/05/2023   MCV 86.1 02/05/2023   PLT 337 02/05/2023   Lab Results  Component Value Date   CREATININE 2.19 (H) 02/05/2023   BUN 36 (H) 02/05/2023   NA 133 (L) 02/05/2023   K 4.9 02/05/2023   CL 106 02/05/2023   CO2 19 (L) 02/05/2023   Lab Results  Component Value Date   ALT 55 (H) 08/14/2022   AST 38 08/14/2022   ALKPHOS 90 08/14/2022   BILITOT 0.7 08/14/2022   Lab Results  Component Value Date   CHOL  02/14/2009    126        ATP III CLASSIFICATION:  <200     mg/dL   Desirable  308-657  mg/dL   Borderline High  >=846    mg/dL   High          HDL 48 02/14/2009    LDLCALC  02/14/2009    59        Total Cholesterol/HDL:CHD Risk Coronary Heart Disease Risk Table                     Men   Women  1/2 Average Risk   3.4   3.3  Average Risk       5.0   4.4  2 X Average Risk   9.6   7.1  3 X Average Risk  23.4   11.0        Use the calculated Patient Ratio above and the CHD Risk Table to determine the patient's CHD Risk.        ATP III CLASSIFICATION (LDL):  <100     mg/dL   Optimal  962-952  mg/dL   Near or Above                    Optimal  130-159  mg/dL   Borderline  841-324  mg/dL   High  >401     mg/dL   Very High   TRIG 95 02/72/5366   CHOLHDL 2.6 02/14/2009    Lab Results  Component Value Date   HGBA1C 6.5 (H) 04/19/2021    Assessment & Plan    1. Chronic diastolic heart failure/cardiomyopathy: Prior EF 45%. Cath at the time showed normal coronaries. Most recent echo in 04/2022 showed EF 60 to 65%, normal LV function, no RWMA, mild LVH, normal RV systolic function, mildly dilated left atrium, mild mitral valve regurgitation.  He has a longstanding history of intermittent adherence to his medication regimen, dietary recommendations, and intermittent follow-up resulting in periods of marked fluid volume overload.  He does have 1-2+ pitting bilateral lower extremity edema with chronic weeping to his legs bilaterally, stable compared to his last visit.  He previously followed with wound care, he has been doing his own Unna boot dressings to his legs bilaterally at home. Declines follow-up with wound care. He denies dietary indiscretion.  Weight has been stable overall.  I advised him to take torsemide 40 mg daily.  He may take an additional  torsemide 20 mg daily (for a total of 60 mg daily) as needed for swelling, weight gain. Reinforced daily weights, sodium and fluid recommendations, ED precautions.  Continue carvedilol, torsemide as above.   2. Permanent atrial fibrillation: Rate controlled enterally, heart rate is mildly elevated in office  today.  Patient states he is anxious about today's visit.  Continue to monitor HR report resting heart rate consistently greater than 100 bpm. Continue diltiazem, carvedilol, Eliquis.    3. Mitral valve regurgitation: Mild on most recent echo.    4. Hypertension: BP well controlled. Continue current antihypertensive regimen.    5. CKD stage III-IV: Creatinine rose to 10.08 during hospitalization in 08/2022 (likely exacerbated by acute urinary retention, bladder outlet obstruction). Renal function improved significantly with placement of Foley catheter. Following with urology and nephrology.  Creatinine was 2.19 on 02/05/2023.  Continue to monitor renal function closely.   6. Obesity: Activity has been limited in the setting of acute on chronic heart failure, multiple hospitalizations.  He has been walking occasionally for exercise.  Encouraged increased activity as tolerated.    7. History of snoring: Previously referred for sleep study, however, he ultimately declined.    8. Preoperative cardiac exam: According to the Revised Cardiac Risk Index (RCRI), his Perioperative Risk of Major Cardiac Event is (%): 6.6. His Functional Capacity in METs is: 5.72 according to the [facility] Activity Status Index (DASI). Therefore, based on ACC/AHA guidelines, patient would be at acceptable risk for the planned procedure without further cardiovascular testing. I will route this recommendation to the requesting party via Epic fax function. Per office protocol, patient can hold Eliquis for 2-3 days prior to procedure.  Please resume Eliquis as soon as possible postprocedure, at the discretion of the surgeon.   9. Disposition: Follow-up in 6 weeks.      Joylene Grapes, NP 02/13/2023, 4:55 PM

## 2023-02-13 NOTE — Patient Instructions (Addendum)
Medication Instructions:  Torsemide take 40 mg daily. May take an addition 20 mg on the days of swelling and weight gain.   *If you need a refill on your cardiac medications before your next appointment, please call your pharmacy*   Lab Work: NONE ordered at this time of appointment    Testing/Procedures: NONE ordered at this time of appointment    Follow-Up: At [facility], you and your health needs are our priority.  As part of our continuing mission to provide you with exceptional heart care, we have created designated Provider Care Teams.  These Care Teams include your primary Cardiologist (physician) and Advanced Practice Providers (APPs -  Physician Assistants and Nurse Practitioners) who all work together to provide you with the care you need, when you need it.  We recommend signing up for the patient portal called "MyChart".  Sign up information is provided on this After Visit Summary.  MyChart is used to connect with patients for Virtual Visits (Telemedicine).  Patients are able to view lab/test results, encounter notes, upcoming appointments, etc.  Non-urgent messages can be sent to your provider as well.   To learn more about what you can do with MyChart, go to [email or web address].    Your next appointment:   1 month(s)  Provider:   Bernadene Person, NP

## 2023-02-16 NOTE — Progress Notes (Signed)
Anesthesia Chart Review   Case: [id number] Date/Time: 02/20/23 0915   Procedure: TRANSURETHRAL WATERJET ABLATION OF PROSTATE - 90 MINS FOR CASE   Anesthesia type: General   Pre-op diagnosis: BENIGN PROSTATE HYPERPLASIA   Location: [facility] PROCEDURE [facility] / [facility]   Surgeons: Jerilee Field, MD       DISCUSSION:67 y.o. never smoker with h/o PONV, asthma, HTN, sleep apnea, NICM, atrial fibrillation, BPH scheduled for above procedure 02/20/2023 with Dr. Jerilee Field.   VS: There were no vitals taken for this visit.  PROVIDERS: Noberto Retort, MD is PCP    LABS: {CHL AN LABS REVIEWED:112001::"Labs reviewed: Acceptable for surgery."} (all labs ordered are listed, but only abnormal results are displayed)  Labs Reviewed - No data to display   IMAGES:   EKG:   CV:  Past Medical History:  Diagnosis Date   Allergic rhinitis    Anxiety    Asthma    as a child   Cellulitis    Chronic kidney disease    Chronic low back pain    Complication of anesthesia    "hard to put asleep, hard to wake up, nausea and vomiting"   Depression    Dysrhythmia    ED (erectile dysfunction)    GERD (gastroesophageal reflux disease)    HLD (hyperlipidemia)    HTN (hypertension)    sees Dr. Elias Else, [facility]   Hypertrophy of prostate with urinary obstruction and other lower urinary tract symptoms (LUTS)    IBS (irritable bowel syndrome)    Inguinal hernia without mention of obstruction or gangrene, unilateral or unspecified, (not specified as recurrent)    Insomnia    Lumbar herniated disc    L7   Morbid obesity (HCC)    Narcotic addiction (HCC)    NICM (nonischemic cardiomyopathy) (HCC)    tachycardia mediated,  resolved with sinus   Persistent atrial fibrillation (HCC)    ablation done 03/2006 at [facility]   Pneumonia    hx of 2004   PONV (postoperative nausea and vomiting)    Sleep apnea    Twitching    legs    Past Surgical History:  Procedure Laterality Date    ATRIAL ABLATION SURGERY  2007   [facility]   CARDIOVERSION  08/11/2011   Procedure: CARDIOVERSION;  Surgeon: Lewayne Bunting, MD;  Location: [facility] OR;  Service: Cardiovascular;  Laterality: N/A;   CARDIOVERSION N/A 04/16/2017   Procedure: CARDIOVERSION;  Surgeon: Thurmon Fair, MD;  Location: [facility] ENDOSCOPY;  Service: Cardiovascular;  Laterality: N/A;   CARDIOVERSION N/A 03/04/2019   Procedure: CARDIOVERSION;  Surgeon: Lewayne Bunting, MD;  Location: [facility] ENDOSCOPY;  Service: Cardiovascular;  Laterality: N/A;   CARDIOVERSION N/A 07/04/2019   Procedure: CARDIOVERSION;  Surgeon: Chilton Si, MD;  Location: [facility] ENDOSCOPY;  Service: Cardiovascular;  Laterality: N/A;   EYE SURGERY     lasik, 1998   HERNIA REPAIR     1977   INGUINAL HERNIA REPAIR Right 08/26/2012   Procedure: LAPAROSCOPIC INGUINAL HERNIA;  Surgeon: Lodema Pilot, DO;  Location: [facility] OR;  Service: General;  Laterality: Right;  laparoscopic right inguinal hernia repair with mesh, umbilical hernia repair   INSERTION OF MESH Right 08/26/2012   Procedure: INSERTION OF MESH;  Surgeon: Lodema Pilot, DO;  Location: [facility] OR;  Service: General;  Laterality: Right;  right inguinal hernia   IR FLUORO GUIDE CV LINE RIGHT  08/15/2022   IR US GUIDE VASC ACCESS RIGHT  08/15/2022   TEE WITHOUT CARDIOVERSION N/A 03/04/2019  Procedure: TRANSESOPHAGEAL ECHOCARDIOGRAM (TEE);  Surgeon: Lewayne Bunting, MD;  Location: [facility] ENDOSCOPY;  Service: Cardiovascular;  Laterality: N/A;   UMBILICAL HERNIA REPAIR N/A 08/26/2012   Procedure: HERNIA REPAIR UMBILICAL ADULT;  Surgeon: Lodema Pilot, DO;  Location: [facility] OR;  Service: General;  Laterality: N/A;    MEDICATIONS: No current facility-administered medications for this encounter.    alfuzosin (UROXATRAL) 10 MG 24 hr tablet   carvedilol (COREG) 25 MG tablet   clonazePAM (KLONOPIN) 0.5 MG tablet   diltiazem (CARDIZEM CD) 360 MG 24 hr capsule   DULoxetine (CYMBALTA) 60 MG capsule   ELIQUIS 5 MG TABS tablet   ferrous  sulfate 325 (65 FE) MG tablet   finasteride (PROSCAR) 5 MG tablet   Multiple Vitamins-Minerals (ZINC PO)   omeprazole (PRILOSEC) 20 MG capsule   POTASSIUM PO   rOPINIRole (REQUIP) 1 MG tablet   Turmeric (QC TUMERIC COMPLEX PO)   TYLENOL 500 MG tablet   torsemide (DEMADEX) 20 MG tablet

## 2023-02-17 NOTE — Anesthesia Preprocedure Evaluation (Addendum)
Anesthesia Evaluation  Patient identified by MRN, date of birth, ID band Patient awake    Reviewed: Allergy & Precautions, H&P , NPO status , Patient's Chart, lab work & pertinent test results  History of Anesthesia Complications (+) PONV and history of anesthetic complications  Airway Mallampati: II  TM Distance: >3 FB Neck ROM: Full    Dental no notable dental hx.    Pulmonary neg pulmonary ROS, asthma , sleep apnea    Pulmonary exam normal breath sounds clear to auscultation       Cardiovascular hypertension, +CHF  Normal cardiovascular exam+ dysrhythmias  Rhythm:Regular Rate:Normal     Neuro/Psych   Anxiety Depression    negative neurological ROS  negative psych ROS   GI/Hepatic Neg liver ROS,GERD  ,,  Endo/Other  negative endocrine ROS    Renal/GU CRFRenal disease  negative genitourinary   Musculoskeletal negative musculoskeletal ROS (+)    Abdominal   Peds negative pediatric ROS (+)  Hematology negative hematology ROS (+)   Anesthesia Other Findings   Reproductive/Obstetrics negative OB ROS                              Anesthesia Physical Anesthesia Plan  ASA: 3  Anesthesia Plan: General   Post-op Pain Management:    Induction: Intravenous  PONV Risk Score and Plan: Ondansetron and Dexamethasone  Airway Management Planned: Oral ETT  Additional Equipment:   Intra-op Plan:   Post-operative Plan: Extubation in OR  Informed Consent: I have reviewed the patients History and Physical, chart, labs and discussed the procedure including the risks, benefits and alternatives for the proposed anesthesia with the patient or authorized representative who has indicated his/her understanding and acceptance.     Dental advisory given  Plan Discussed with: CRNA  Anesthesia Plan Comments: (67 y.o. never smoker with h/o PONV, asthma, HTN, sleep apnea, NICM, atrial fibrillation,  BPH scheduled for above procedure 02/20/2023 with Dr. Jerilee Field. )        Anesthesia Quick Evaluation

## 2023-02-18 ENCOUNTER — Telehealth: Payer: Self-pay | Admitting: Cardiology

## 2023-02-18 NOTE — Telephone Encounter (Signed)
Unable to leave voicemail. Mailbox full

## 2023-02-18 NOTE — Telephone Encounter (Signed)
Pt states that he is returning a call from nurse. Please advise

## 2023-02-19 ENCOUNTER — Telehealth: Payer: Self-pay | Admitting: Cardiology

## 2023-02-19 NOTE — Telephone Encounter (Signed)
Call to Mobile, Goes straight to VM. Mail box full, unable to LM  Call to Home, name Rosey Bath on VM.  LM to return call

## 2023-02-19 NOTE — Telephone Encounter (Signed)
Patient and wife return call.  Call drops while on with them.  Call back to wife.  Advised I do not see anywhere that we called since a call to reschedule his preop appt.  She will ust be aware of our number and answer if someone calls back.

## 2023-02-19 NOTE — Telephone Encounter (Signed)
Duplicate encounter. Please see other encounter

## 2023-02-19 NOTE — Telephone Encounter (Signed)
Calls go straight to VM. Mailbox full.  Sent My chart to see who the original caller was so we can answer any questions

## 2023-02-19 NOTE — H&P (Signed)
Office Visit Report     02/11/2023   --------------------------------------------------------------------------------   [name]  MRN: [id number]  DOB: 1956-05-18, 67 year old Male  SSN: [id number]   PRIMARY CARE:  Arvella Merles, MD  PRIMARY CARE FAX:  [phone]  REFERRING:  Latrelle Dodrill. Octavia Heir,   PROVIDER:  Alen Blew. Lafonda Mosses, M.D.  TREATING:  Buzzy Han, PA-C  LOCATION:  [facility] [phone]     --------------------------------------------------------------------------------   CC/HPI: New pt for me -   1) BPH with urinary retention -he developed urinary retention October 2023. He passed a voiding trial. He developed urinary retention again February 2024 with a distended bladder and bilateral hydronephrosis. Finasteride was added to tamsulosin. He underwent a CT scan February 2024 which showed a prominent median lobe configuration that measured 91 g including the median lobe or 57 g without it. He underwent urodynamics which showed a capacity of 357 mL with no voiding phase and a detrusor pressure of only 26 cm of water.   At some point he stopped the finasteride. Cystoscopy today with a large median lobe and tall lateral lobes. I think he is going to do best with aqua ablation.   His PSA was 2.4 in 2021. Creatinine 1.79, GFR 42 in May 2024.   02/11/2023:  Patient presents with his wife is signed for presurgical clearance on Aquablation on 8/16. He continues on finasteride 5 mg, alfuzosin 10 mg daily. Since last seen, patient has had no changes to overall medical health nor baseline voiding function. He has not undergone any new procedures, initiated new medications, nor been given any new diagnoses. His catheter continues to drain appropriately, returning turbid, yellow urine. Today, patient denies irritative symptoms, dysuria, flank pain, fever/chills, nausea/vomiting. Of note, patient has been providing urine samples from his catheter bag. We  repeated his urinalysis with a catheter sample.     ALLERGIES: Penicillin    MEDICATIONS: Cipro 250 mg tablet 1 tablet PO BID start on Sunday August 11 in preparation for prostate procedure  Finasteride 5 mg tablet 1 tablet PO Daily  Omeprazole  Alfuzosin Hcl Er 10 mg tablet, extended release 24 hr 1 tablet PO Daily  Carvedilol  Cymbalta 30 MG Oral Capsule Delayed Release Particles Oral  Diltiazem 12Hr Er  Eliquis 5 mg tablet  Tylenol     GU PSH: Complex cystometrogram, w/ void pressure and urethral pressure profile studies, any technique - 10/13/2022 Complex Uroflow - 10/13/2022 Cystoscopy - 12/18/2022 Emg surf Electrd - 10/13/2022 Inject For cystogram - 10/13/2022 Intrabd voidng Press - 10/13/2022 Vasectomy - 2009       PSH Notes: Surgery Of Male Genitalia Vasectomy, Surgical Ablation Of Arrhythmogenic Pathway, Hernia Repair   NON-GU PSH: Ablate Heart Dysrhythm Focus - 2009 Hernia Repair - 2009     GU PMH: Urinary Retention - 01/20/2023, - 12/18/2022, - 11/18/2022, - 10/13/2022, - 09/25/2022, - 09/12/2022, - 09/04/2022, - 08/21/2022, - 08/19/2022, - 05/09/2022, - 05/08/2022, - 05/01/2022 BPH w/LUTS, We discussed the management of BPH with medications and procedures. We went over the nature risk benefits to water jet ablation therapy prostate including side effects of the procedure, expected post-op course and likelihood of success. We discussed flow symptoms and irritative symptoms typically improve, but frequency and urgency can persist and rarely worsen. We also discussed risk of bleeding, infection, stricture, sexual dysfunction and incontinence among others. We also discussed expectations for a staged or repeat procedure in the future. All questions answered. He elects  to proceed. Will restart finasteride. - 12/18/2022, Benign prostatic hyperplasia with urinary obstruction, - 2014 Obstructive and reflux uropathy, Unspec - 12/18/2022, - 09/12/2022, He's had severe bilateral hydronephrosis on CT imaging  in Oct 2023 and 08/13/22 stemming from underlying urinary retention. Also with associated acute on chronic CKD., - 09/04/2022 [place] due to arterial insufficiency - 10/31/2021, - 09/30/2021, - 2023, Erectile dysfunction due to arterial insufficiency, - 2014 Other urethritis - 10/31/2021, - 09/30/2021, - 2023 Disorder of Penis Ot, Penis Disorders - 2014 Other microscopic hematuria, Microscopic Hematuria - 2014 Renal cyst, Renal cysts, acquired, bilateral - 2014 Unil Inguinal Hernia W/O obst or gang,non-recurrent, Inguinal hernia, right - 2014      PMH Notes:  1898-07-07 00:00:00 - Note: Normal Routine History And Physical Adult  2012-08-02 14:14:20 - Note: Sinus Arrhythmia   NON-GU PMH: Anxiety, Anxiety (Symptom) - 2014 Arrhythmia, Rhythm Disorder - 2014 Personal history of other diseases of the circulatory system, History of hypertension - 2014 Personal history of other mental and behavioral disorders, History of depression - 2014 Unspecified atrial fibrillation, Atrial Fibrillation - 2014    Immunizations: None   FAMILY HISTORY: Acute Myocardial Infarction - Mother Death In The Family Father - Runs In Family Death In The Family Mother - Runs In Family Emphysema - Father Family Health Status Number - Runs In Family Prostate Cancer - Father   SOCIAL HISTORY: Marital Status: Married     Notes: Never A Smoker, Caffeine Use, Occupation:, Marital History - Currently Married, Alcohol Use   VITAL SIGNS:      02/11/2023 02:51 PM  BP 137/87 mmHg  Pulse 108 /min  Temperature 97.3 F / 36.2 C   MULTI-SYSTEM PHYSICAL EXAMINATION:    Constitutional: Obese. No physical deformities. Normally developed. Good grooming.   Respiratory: No labored breathing, no use of accessory muscles. Lungs clear to auscultation bilaterally. No wheezing, rales.  Cardiovascular: Normal temperature, normal extremity pulses, no swelling, no varicosities. Regular rate and rhythm. No murmurs, gallops.  Skin: No paleness,  no jaundice, no cyanosis. No lesion, no ulcer, no rash.  Neurologic / Psychiatric: Oriented to time, oriented to place, oriented to person. No depression, no anxiety, no agitation.  Gastrointestinal: Obese abdomen. No mass, no tenderness, no rigidity.      Complexity of Data:  Source Of History:  Patient, Family/Caregiver, Medical Record Summary  Records Review:   Previous Doctor Records, Previous Patient Records  Urine Test Review:   Urinalysis   11/04/19 10/05/07  PSA  Total PSA 2.48 ng/ml 1.45     02/11/23 02/11/23  Urinalysis  Urine Appearance Turbid  Cloudy   Urine Color Yellow  Yellow   Urine Glucose Neg mg/dL Neg mg/dL  Urine Bilirubin Neg mg/dL Neg mg/dL  Urine Ketones Neg mg/dL Neg mg/dL  Urine Specific Gravity 1.015  1.015   Urine Blood 2+ ery/uL 3+ ery/uL  Urine pH 6.5  6.5   Urine Protein 2+ mg/dL 2+ mg/dL  Urine Urobilinogen 0.2 mg/dL 0.2 mg/dL  Urine Nitrites Neg  Positive   Urine Leukocyte Esterase 3+ leu/uL 3+ leu/uL  Urine WBC/hpf >60/hpf  >60/hpf   Urine RBC/hpf 3 - 10/hpf  3 - 10/hpf   Urine Epithelial Cells NS (Not Seen)  NS (Not Seen)   Urine Bacteria Many (>50/hpf)  Many (>50/hpf)   Urine Mucous Not Present  Not Present   Urine Yeast NS (Not Seen)  NS (Not Seen)   Urine Trichomonas Not Present  Not Present   Urine Cystals NS (Not Seen)  NS (Not Seen)   Urine Casts NS (Not Seen)  NS (Not Seen)   Urine Sperm Not Present  Not Present   Urine C&S  Culture, Cath Urine -    Other  ~Organism 1 -     PROCEDURES:          Urinalysis w/Scope Dipstick Dipstick Cont'd Micro  Color: Yellow Bilirubin: Neg mg/dL WBC/hpf: >16/XWR  Appearance: Turbid Ketones: Neg mg/dL RBC/hpf: 3 - 60/AVW  Specific Gravity: 1.015 Blood: 2+ ery/uL Bacteria: Many (>50/hpf)  pH: 6.5 Protein: 2+ mg/dL Cystals: NS (Not Seen)  Glucose: Neg mg/dL Urobilinogen: 0.2 mg/dL Casts: NS (Not Seen)    Nitrites: Neg Trichomonas: Not Present    Leukocyte Esterase: 3+ leu/uL Mucous: Not Present       Epithelial Cells: NS (Not Seen)      Yeast: NS (Not Seen)      Sperm: Not Present    Notes: **Unspun micro**          Urinalysis w/Scope Dipstick Dipstick Cont'd Micro  Color: Yellow Bilirubin: Neg mg/dL WBC/hpf: >09/WJX  Appearance: Cloudy Ketones: Neg mg/dL RBC/hpf: 3 - 91/YNW  Specific Gravity: 1.015 Blood: 3+ ery/uL Bacteria: Many (>50/hpf)  pH: 6.5 Protein: 2+ mg/dL Cystals: NS (Not Seen)  Glucose: Neg mg/dL Urobilinogen: 0.2 mg/dL Casts: NS (Not Seen)    Nitrites: Positive Trichomonas: Not Present    Leukocyte Esterase: 3+ leu/uL Mucous: Not Present      Epithelial Cells: NS (Not Seen)      Yeast: NS (Not Seen)      Sperm: Not Present    Notes: **Unspun micro**    ASSESSMENT:      ICD-10 Details  1 GU:   Acute Cystitis/UTI - N30.00 Undiagnosed New Problem  2   BPH w/LUTS - N40.1 Chronic, Stable  3   Obstructive and reflux uropathy, Unspec - N13.9 Chronic, Stable   PLAN:           Orders Labs Urine Culture, Urinalysis          Schedule Return Visit/Planned Activity: Keep Scheduled Appointment             Note: Aquablation on 8/16          Document Letter(s):  Created for Patient: Clinical Summary         Notes:   Today, cath UA sample with infectious parameters. We will send for presurgical clearance and follow-up with patient as appropriate. However, patient's urologist had called in a prescription for ciprofloxacin to initiate on 8/11, and to continue through time of surgery.   We reviewed the upcoming procedure in details, and answered patient's questions to the best of our abilities.   Maintain upcoming surgical appointment for Aquablation on 8/22. Patient knows to inform this office of any interim changes to overall medical health or baseline voiding function. Continue on tamsulosin, finasteride, and initiate Cipro on 8/11. Patient and his wife both voiced understanding and are amenable to this plan.        Next Appointment:      Next Appointment:  02/20/2023 09:30 AM    Appointment Type: Surgery     Location: [facility] [phone]    Provider: Jerilee Field, M.D.    Reason for Visit: OBS [facility] AQUABLATION OF PROSTATE      * Signed by Buzzy Han, PA-C on 02/16/23 at 11:48 PM (EDT)*     ADD: urine cx grew Serratia sensitive to Rocephin, Cipro, Bactrim. He was sent cipro to  start Aug 11.

## 2023-02-19 NOTE — Telephone Encounter (Signed)
Patient returned RN's call. 

## 2023-02-20 ENCOUNTER — Encounter (HOSPITAL_COMMUNITY): Payer: Self-pay | Admitting: Urology

## 2023-02-20 ENCOUNTER — Ambulatory Visit (HOSPITAL_COMMUNITY)
Admission: RE | Admit: 2023-02-20 | Discharge: 2023-02-20 | Disposition: A | Discharge status: Home / Self Care | Payer: Medicare Other | Source: Ambulatory Visit | Attending: Urology | Admitting: Urology

## 2023-02-20 ENCOUNTER — Encounter (HOSPITAL_COMMUNITY)
Admission: RE | Disposition: A | Discharge status: Home / Self Care | Payer: Self-pay | Source: Ambulatory Visit | Attending: Urology

## 2023-02-20 ENCOUNTER — Ambulatory Visit (HOSPITAL_COMMUNITY): Payer: Medicare Other | Admitting: Physician Assistant

## 2023-02-20 ENCOUNTER — Ambulatory Visit (HOSPITAL_BASED_OUTPATIENT_CLINIC_OR_DEPARTMENT_OTHER): Payer: Medicare Other | Admitting: Physician Assistant

## 2023-02-20 ENCOUNTER — Other Ambulatory Visit: Payer: Self-pay

## 2023-02-20 DIAGNOSIS — G4730 Sleep apnea, unspecified: Secondary | ICD-10-CM | POA: Insufficient documentation

## 2023-02-20 DIAGNOSIS — N189 Chronic kidney disease, unspecified: Secondary | ICD-10-CM | POA: Diagnosis not present

## 2023-02-20 DIAGNOSIS — I110 Hypertensive heart disease with heart failure: Secondary | ICD-10-CM | POA: Diagnosis not present

## 2023-02-20 DIAGNOSIS — N401 Enlarged prostate with lower urinary tract symptoms: Secondary | ICD-10-CM | POA: Insufficient documentation

## 2023-02-20 DIAGNOSIS — I130 Hypertensive heart and chronic kidney disease with heart failure and stage 1 through stage 4 chronic kidney disease, or unspecified chronic kidney disease: Secondary | ICD-10-CM | POA: Diagnosis not present

## 2023-02-20 DIAGNOSIS — R3912 Poor urinary stream: Secondary | ICD-10-CM

## 2023-02-20 DIAGNOSIS — N529 Male erectile dysfunction, unspecified: Secondary | ICD-10-CM | POA: Insufficient documentation

## 2023-02-20 DIAGNOSIS — I5033 Acute on chronic diastolic (congestive) heart failure: Secondary | ICD-10-CM | POA: Diagnosis not present

## 2023-02-20 DIAGNOSIS — I509 Heart failure, unspecified: Secondary | ICD-10-CM | POA: Insufficient documentation

## 2023-02-20 DIAGNOSIS — J45909 Unspecified asthma, uncomplicated: Secondary | ICD-10-CM | POA: Diagnosis not present

## 2023-02-20 DIAGNOSIS — K219 Gastro-esophageal reflux disease without esophagitis: Secondary | ICD-10-CM | POA: Diagnosis not present

## 2023-02-20 DIAGNOSIS — Z6836 Body mass index (BMI) 36.0-36.9, adult: Secondary | ICD-10-CM | POA: Diagnosis not present

## 2023-02-20 DIAGNOSIS — I4819 Other persistent atrial fibrillation: Secondary | ICD-10-CM

## 2023-02-20 DIAGNOSIS — N138 Other obstructive and reflux uropathy: Secondary | ICD-10-CM

## 2023-02-20 DIAGNOSIS — R350 Frequency of micturition: Secondary | ICD-10-CM | POA: Insufficient documentation

## 2023-02-20 SURGERY — ABLATION, PROSTATE, TRANSURETHRAL, USING WATERJET
Anesthesia: General

## 2023-02-20 MED ORDER — OXYCODONE HCL 5 MG/5ML PO SOLN: 5.0000 mg | Freq: Once | ORAL | Status: DC | PRN

## 2023-02-20 MED ORDER — LIDOCAINE HCL (PF) 2 % IJ SOLN
INTRAMUSCULAR | Status: AC
  Filled 2023-02-20: qty 5

## 2023-02-20 MED ORDER — DEXAMETHASONE SODIUM PHOSPHATE 4 MG/ML IJ SOLN
INTRAMUSCULAR | Status: DC | PRN
  Administered 2023-02-20: 4 mg via INTRAVENOUS

## 2023-02-20 MED ORDER — ROCURONIUM BROMIDE 10 MG/ML (PF) SYRINGE
PREFILLED_SYRINGE | INTRAVENOUS | Status: AC
  Filled 2023-02-20: qty 10

## 2023-02-20 MED ORDER — CHLORHEXIDINE GLUCONATE 0.12 % MT SOLN
15.0000 mL | Freq: Once | OROMUCOSAL | Status: AC
  Administered 2023-02-20: 15 mL via OROMUCOSAL

## 2023-02-20 MED ORDER — IPRATROPIUM-ALBUTEROL 0.5-2.5 (3) MG/3ML IN SOLN
RESPIRATORY_TRACT | Status: AC
  Filled 2023-02-20: qty 3

## 2023-02-20 MED ORDER — DEXAMETHASONE SODIUM PHOSPHATE 10 MG/ML IJ SOLN
INTRAMUSCULAR | Status: AC
  Filled 2023-02-20: qty 1

## 2023-02-20 MED ORDER — TRANEXAMIC ACID-NACL 1000-0.7 MG/100ML-% IV SOLN
1000.0000 mg | INTRAVENOUS | Status: AC
  Administered 2023-02-20: 1000 mg via INTRAVENOUS
  Filled 2023-02-20: qty 100

## 2023-02-20 MED ORDER — LEVOFLOXACIN IN D5W 750 MG/150ML IV SOLN
750.0000 mg | INTRAVENOUS | Status: AC
  Administered 2023-02-20: 750 mg via INTRAVENOUS
  Filled 2023-02-20: qty 150

## 2023-02-20 MED ORDER — SODIUM CHLORIDE 0.9 % IR SOLN
Status: DC | PRN
  Administered 2023-02-20: 27000 mL

## 2023-02-20 MED ORDER — MIDAZOLAM HCL 5 MG/5ML IJ SOLN
INTRAMUSCULAR | Status: DC | PRN
  Administered 2023-02-20: 2 mg via INTRAVENOUS

## 2023-02-20 MED ORDER — ORAL CARE MOUTH RINSE: 15.0000 mL | Freq: Once | OROMUCOSAL | Status: AC

## 2023-02-20 MED ORDER — SUGAMMADEX SODIUM 200 MG/2ML IV SOLN
INTRAVENOUS | Status: DC | PRN
  Administered 2023-02-20: 200 mg via INTRAVENOUS

## 2023-02-20 MED ORDER — FENTANYL CITRATE (PF) 100 MCG/2ML IJ SOLN
INTRAMUSCULAR | Status: AC
  Filled 2023-02-20: qty 2

## 2023-02-20 MED ORDER — SULFAMETHOXAZOLE-TRIMETHOPRIM 400-80 MG PO TABS: 1.0000 | ORAL_TABLET | Freq: Every day | ORAL | 0 refills | Status: DC

## 2023-02-20 MED ORDER — MIDAZOLAM HCL 2 MG/2ML IJ SOLN
INTRAMUSCULAR | Status: AC
  Filled 2023-02-20: qty 2

## 2023-02-20 MED ORDER — DROPERIDOL 2.5 MG/ML IJ SOLN: 0.6250 mg | Freq: Once | INTRAMUSCULAR | Status: DC | PRN

## 2023-02-20 MED ORDER — APIXABAN 5 MG PO TABS
5.0000 mg | ORAL_TABLET | Freq: Two times a day (BID) | ORAL | Status: DC
Start: 2023-02-23 — End: 2023-04-30

## 2023-02-20 MED ORDER — ONDANSETRON HCL 4 MG/2ML IJ SOLN
INTRAMUSCULAR | Status: DC | PRN
  Administered 2023-02-20: 4 mg via INTRAVENOUS

## 2023-02-20 MED ORDER — OXYCODONE HCL 5 MG PO TABS: 5.0000 mg | ORAL_TABLET | Freq: Once | ORAL | Status: DC | PRN

## 2023-02-20 MED ORDER — ROCURONIUM BROMIDE 10 MG/ML (PF) SYRINGE
PREFILLED_SYRINGE | INTRAVENOUS | Status: DC | PRN
  Administered 2023-02-20: 70 mg via INTRAVENOUS
  Administered 2023-02-20: 10 mg via INTRAVENOUS

## 2023-02-20 MED ORDER — STERILE WATER FOR IRRIGATION IR SOLN
Status: DC | PRN
  Administered 2023-02-20: 500 mL

## 2023-02-20 MED ORDER — ONDANSETRON HCL 4 MG/2ML IJ SOLN
INTRAMUSCULAR | Status: AC
  Filled 2023-02-20: qty 2

## 2023-02-20 MED ORDER — LIDOCAINE HCL (CARDIAC) PF 100 MG/5ML IV SOSY
PREFILLED_SYRINGE | INTRAVENOUS | Status: DC | PRN
  Administered 2023-02-20: 60 mg via INTRAVENOUS

## 2023-02-20 MED ORDER — FENTANYL CITRATE (PF) 100 MCG/2ML IJ SOLN
INTRAMUSCULAR | Status: DC | PRN
  Administered 2023-02-20: 100 ug via INTRAVENOUS
  Administered 2023-02-20 (×2): 50 ug via INTRAVENOUS

## 2023-02-20 MED ORDER — IPRATROPIUM-ALBUTEROL 0.5-2.5 (3) MG/3ML IN SOLN
3.0000 mL | Freq: Once | RESPIRATORY_TRACT | Status: AC
  Administered 2023-02-20: 3 mL via RESPIRATORY_TRACT

## 2023-02-20 MED ORDER — FENTANYL CITRATE PF 50 MCG/ML IJ SOSY
25.0000 ug | PREFILLED_SYRINGE | INTRAMUSCULAR | Status: DC | PRN
  Administered 2023-02-20: 25 ug via INTRAVENOUS

## 2023-02-20 MED ORDER — FENTANYL CITRATE PF 50 MCG/ML IJ SOSY
PREFILLED_SYRINGE | INTRAMUSCULAR | Status: AC
  Administered 2023-02-20: 25 ug via INTRAVENOUS
  Filled 2023-02-20: qty 1

## 2023-02-20 MED ORDER — 0.9 % SODIUM CHLORIDE (POUR BTL) OPTIME
TOPICAL | Status: DC | PRN
  Administered 2023-02-20: 1000 mL

## 2023-02-20 MED ORDER — PROPOFOL 10 MG/ML IV BOLUS
INTRAVENOUS | Status: DC | PRN
  Administered 2023-02-20: 200 mg via INTRAVENOUS

## 2023-02-20 MED ORDER — LACTATED RINGERS IV SOLN: INTRAVENOUS | Status: DC

## 2023-02-20 SURGICAL SUPPLY — 30 items
BAG DRN RND TRDRP ANRFLXCHMBR (UROLOGICAL SUPPLIES) ×1
BAG URINE DRAIN 2000ML AR STRL (UROLOGICAL SUPPLIES) ×1 IMPLANT
BAND INSRT 18 STRL LF DISP RB (MISCELLANEOUS) ×1
BAND RUBBER #18 3X1/16 STRL (MISCELLANEOUS) IMPLANT
CANISTER SUCT 3000ML PPV (MISCELLANEOUS) ×1 IMPLANT
CATH HEMA 3WAY 30CC 22FR COUDE (CATHETERS) IMPLANT
CATH HEMA 3WAY 30CC 24FR COUDE (CATHETERS) IMPLANT
COVER MAYO STAND STRL (DRAPES) ×1 IMPLANT
DRAPE FOOT SWITCH (DRAPES) ×1 IMPLANT
GEL ULTRASOUND 8.5O AQUASONIC (MISCELLANEOUS) ×1 IMPLANT
GLOVE SURG LX STRL 7.5 STRW (GLOVE) ×1 IMPLANT
GOWN STRL REUS W/ TWL XL LVL3 (GOWN DISPOSABLE) ×1 IMPLANT
GOWN STRL REUS W/TWL XL LVL3 (GOWN DISPOSABLE) ×1
GUIDEWIRE ANG ZIPWIRE 038X150 (WIRE) IMPLANT
HANDPIECE AQUABEAM (MISCELLANEOUS) ×1 IMPLANT
HOLDER FOLEY CATH W/STRAP (MISCELLANEOUS) IMPLANT
KIT TURNOVER KIT A (KITS) IMPLANT
LOOP CUT BIPOLAR 24F LRG (ELECTROSURGICAL) IMPLANT
MANIFOLD NEPTUNE II (INSTRUMENTS) ×1 IMPLANT
MAT ABSORB FLUID 56X50 GRAY (MISCELLANEOUS) ×1 IMPLANT
PACK CYSTO (CUSTOM PROCEDURE TRAY) ×1 IMPLANT
PACK DRAPE AQUABEAM (MISCELLANEOUS) ×1 IMPLANT
PIN SAFETY STERILE (MISCELLANEOUS) IMPLANT
SYR 30ML LL (SYRINGE) ×1 IMPLANT
SYR TOOMEY IRRIG 70ML (MISCELLANEOUS) ×2
SYRINGE TOOMEY IRRIG 70ML (MISCELLANEOUS) ×2 IMPLANT
TOWEL OR 17X24 6PK STRL BLUE (TOWEL DISPOSABLE) ×1 IMPLANT
TUBING CONNECTING 10 (TUBING) ×2 IMPLANT
TUBING UROLOGY SET (TUBING) ×1 IMPLANT
UNDERPAD 30X36 HEAVY ABSORB (UNDERPADS AND DIAPERS) ×1 IMPLANT

## 2023-02-20 NOTE — Interval H&P Note (Signed)
History and Physical Interval Note:  02/20/2023 10:08 AM  [name]  has presented today for surgery, with the diagnosis of BENIGN PROSTATE HYPERPLASIA.  The various methods of treatment have been discussed with the patient and family. After consideration of risks, benefits and other options for treatment, the patient has consented to  Procedure(s) with comments: TRANSURETHRAL WATERJET ABLATION OF PROSTATE (N/A) - 90 MINS FOR CASE as a surgical intervention.  The patient's history has been reviewed, patient examined, no change in status, stable for surgery.  I have reviewed the patient's chart and labs.  Questions were answered to the patient's satisfaction.  [name] has been well.  He started the Cipro.  He had no bladder pain or dysuria.  He has had no fever cough cold or congestion.  He does have concerned about the SCDs.  He has an open right weeping wound on his leg. The left leg just healed up.  We discussed the SCDs or simply going to circulate the venous system and given that he has poor drainage I would recommend them.  We did discuss his leg wounds could worsen or open back up again but we certainly want to prevent a blood clot or DVT which could become a PE and be life-threatening given that he will be in lithotomy for an hour plus. The other option is offered to delay the procedure. This would allow the left leg wound to continue to heal and allow the right leg to heal and we could do that once his wounds are stable.  All questions answered.  He like to go ahead and proceed today and consented for the SCDs.   Jerilee Field

## 2023-02-20 NOTE — Anesthesia Postprocedure Evaluation (Signed)
Anesthesia Post Note  Patient: [name]  Procedure(s) Performed: TRANSURETHRAL WATERJET ABLATION OF PROSTATE     Patient location during evaluation: PACU Anesthesia Type: General Level of consciousness: awake and alert Pain management: pain level controlled Vital Signs Assessment: post-procedure vital signs reviewed and stable Respiratory status: spontaneous breathing, nonlabored ventilation, respiratory function stable and patient connected to nasal cannula oxygen Cardiovascular status: blood pressure returned to baseline and stable Postop Assessment: no apparent nausea or vomiting Anesthetic complications: no   No notable events documented.  Last Vitals:  Vitals:   02/20/23 1445 02/20/23 1500  BP: (!) 152/99 (!) 156/108  Pulse: 100 91  Resp: 12 14  Temp:    SpO2: 97% 99%    Last Pain:  Vitals:   02/20/23 1430  TempSrc:   PainSc: Asleep                 Nittany Nation

## 2023-02-20 NOTE — Transfer of Care (Signed)
Immediate Anesthesia Transfer of Care Note  Patient: [name]  Procedure(s) Performed: TRANSURETHRAL WATERJET ABLATION OF PROSTATE  Patient Location: PACU  Anesthesia Type:General  Level of Consciousness: drowsy  Airway & Oxygen Therapy: Patient Spontanous Breathing and Patient connected to face mask oxygen  Post-op Assessment: Report given to RN and Post -op Vital signs reviewed and stable  Post vital signs: Reviewed and stable  Last Vitals:  Vitals Value Taken Time  BP 138/87 02/20/23 1248  Temp    Pulse 96 02/20/23 1249  Resp 11 02/20/23 1251  SpO2 94 % 02/20/23 1249  Vitals shown include unfiled device data.  Last Pain:  Vitals:   02/20/23 0800  TempSrc: Oral  PainSc: 0-No pain         Complications: No notable events documented.

## 2023-02-20 NOTE — Op Note (Addendum)
Preoperative diagnosis: BPH with lower urinary tract symptoms, weak stream, frequency  Postoperative diagnosis: Same   Procedure: Robotic water jet ablation of the prostate   Surgeon: Mena Goes   Anesthesia: General   Indication for procedure:   Findings:  EUA prostate was about 40 g and smooth without hard area or nodule. Cystoscopy revealed trilobar hypertrophy with a large intravesical median lobe.  Severe bladder trabeculation. Also, at end of the procedure his legs appeared stable and unharmed from the SCD's. His right leg had been wrapped at home. We wrapped his left leg.   Description of procedure:  He was brought to the operating room and placed supine on the operating table.  After adequate anesthesia he was placed lithotomy position. Timeout was performed to confirm the patient and procedure. The TRUS Stepper was mounted to the Articulating Arm and secured to OR bed. The ultrasound probe was attached to the stepper. Exam under anesthesia was performed and the TRUS was inserted per rectum.  There was no resistance. The ultrasound probe was aligned, and confirmation made that the prostate is centered and aligned using both transverse and sagittal views. The bladder neck, verumontanum and the central/transition zones were identified.  Genitalia were prepped and draped in the usual sterile fashion. The 50F AQUABEAM Handpiece is inserted into the prostatic urethra and a complete cystoscopic evaluation was performed by inspecting the prostate, bladder, and identifying the location of the verumontanum/external sphincter. The AQUABEAM Handpiece was secured to the Handpiece Articulating Arm. Confirmed alignment of AQUABEAM Handpiece and TRUS Probe to be parallel and colinear. Confirmation that AQUABEAM nozzle is centered and anterior of the bladder neck or the median lobe. The cystoscope was then retracted to visualize the verumontanum and external sphincter and the cystoscope tip was positioned  just proximal to the external sphincter. Reconfirmed alignment of the TRUS probe with the AQUABEAM Handpiece and compression applied with TRUS probe. Horizontal alignment of the Handpiece waterjet nozzle was performed. The Aquablation treatment zones were planned utilizing real-time TRUS to visualize the contour of the prostate and the depth and radial angles of resection were defined in the transverse view. In the sagittal view, the AQUABEAM nozzle is identified and position registered with software. The treatment contours were then adjusted to conform to the intended resection margins. The median lobe, bladder neck and verumontanum were marked and confirmed in the treatment contour.  He had a large median lobe and we planned to treat this with a separate program.  The Aquablation Treatment was then started following the resection contour confirmed under ultrasound guidance.  TOTAL AQUABLATION RESECTION TIME: about 4 min 30 sec each   Once Aquablation resection was complete the 24 French aqua beam handpiece was carefully removed.  The continuous-flow sheath with the visual obturator was passed and then the loop and handle.  The transrectal ultrasound was removed and the tip noted to be normal without blood.  The trigone and the ureteral orifices were identified.  Resection of some of the residual median lobe and bladder neck tissue was done.  The ureteral orifice ease were located and their location was marked.  They appeared normal.  The bladder neck was identified at 6:00 and this was taken up to 12:00 with fulguration of the bladder neck and prostate for hemostasis.  Slight amount of anterior tissue was resected.  Similarly from 6:00 up to 12:00 on the left side of the bladder neck was identified by resecting some of the ablated tissue to identify the bladder neck and  cauterize any bleeding.  On the left there was a good amount of intravesical median lobe remaining.  This was resected carefully from 6:00 up  to the patient's left at 3:00.  Again able to identify the left ureteral orifice. Some anterior tissue on the left was resected.  This created excellent hemostasis.  A large piece of the median lobe was resected at the bladder neck and washed into the bladder.  We swapped out to the cystoscope and tried to remove it but could not get a good grasp on it.  Therefore I placed the resectoscope backend and carefully resected the piece of tissue in the bladder.  This was successfully done and all chips evacuated.  The bladder and ureteral orifices again identified and noted to be normal without injury.  The handle was then removed and a Glidewire was placed into the bladder.  The scope was backed out.  The scope was backed out and a 22 [place] hematuria catheter was placed with 30 cc in the balloon.  The balloon was seated at the bladder neck and it was irrigated on light traction and noted to be clear to pink.  He was hooked up to CBI.  He was cleaned up and placed supine.  Catheter was placed on traction.  He was awakened and taken to the cover room in stable condition.  Complications: None  Blood loss: 100 mL  Specimens: None  Drains: 22 French three-way hematuria catheter with 30 cc in the balloon  Disposition: Patient stable to PACU

## 2023-02-20 NOTE — Anesthesia Procedure Notes (Signed)
Procedure Name: Intubation Date/Time: 02/20/2023 10:22 AM  Performed by: Caren Macadam, CRNAPre-anesthesia Checklist: Patient identified, Emergency Drugs available, Suction available and Patient being monitored Patient Re-evaluated:Patient Re-evaluated prior to induction Oxygen Delivery Method: Circle system utilized Preoxygenation: Pre-oxygenation with 100% oxygen Induction Type: IV induction Ventilation: Two handed mask ventilation required Laryngoscope Size: Glidescope and 4 Grade View: Grade I Tube type: Oral Tube size: 8.0 mm Number of attempts: 1 Airway Equipment and Method: Rigid stylet and Video-laryngoscopy Placement Confirmation: ETT inserted through vocal cords under direct vision, positive ETCO2 and breath sounds checked- equal and bilateral Secured at: 23 cm Tube secured with: Tape Dental Injury: Teeth and Oropharynx as per pre-operative assessment

## 2023-02-20 NOTE — Discharge Instructions (Addendum)
Transurethral water jet ablation of the prostate, Care After The following information offers guidance on how to care for yourself after your procedure. Your health care provider may also give you more specific instructions. If you have problems or questions, contact your health care provider. What can I expect after the procedure? After the procedure, it is common to have: Mild pain in your lower abdomen. Soreness or mild discomfort in your penis or when you urinate. This is from having the catheter inserted during the procedure. A sudden urge to urinate (urgency). A need to urinate often. A small amount of blood in your urine. You may notice some small blood clots in your urine. These are normal. Follow these instructions at home: Medicines Take over-the-counter and prescription medicines only as told by your health care provider. If you were prescribed an antibiotic medicine, take it as told by your health care provider. Do not stop taking the antibiotic even if you start to feel better. Activity  Rest as told by your health care provider. Avoid sitting for a long time without moving. Get up to take short walks every 1-2 hours. This is important to improve blood flow and breathing. Ask for help if you feel weak or unsteady. You may increase your physical activity gradually as you start to feel better. Do not drive or operate machinery until your health care provider says that it is safe. Do not ride in a car for long periods of time, or as told by your health care provider. Avoid intense physical activity for as long as told by your health care provider. Do not lift anything that is heavier than 10 lb (4.5 kg), or the limit that you are told, until your health care provider says that it is safe. Do not have sex until your health care provider approves. Return to your normal activities as told by your health care provider. Ask your health care provider what activities are safe for  you. Preventing constipation You may need to take these actions to prevent or treat constipation: Drink enough fluid to keep your urine pale yellow. Take over-the-counter or prescription medicines. Eat foods that are high in fiber, such as beans, whole grains, and fresh fruits and vegetables. Limit foods that are high in fat and processed sugars, such as fried or sweet foods.  General instructions Do not strain when you have a bowel movement. Straining may lead to bleeding from the prostate. This may cause blood clots and trouble urinating. Do not use any products that contain nicotine or tobacco. These products include cigarettes, chewing tobacco, and vaping devices, such as e-cigarettes. If you need help quitting, ask your health care provider. If you go home with a tube draining your urine (urinary catheter), care for the catheter as told by your health care provider. Wear compression stockings as told by your health care provider. These stockings help to prevent blood clots and reduce swelling in your legs. Keep all follow-up visits. This is important. Contact a health care provider if: You have signs of infection, such as: Fever or chills. Urine that smells very bad. Swelling around your urethra that is getting worse. Swelling in your penis or testicles. You have difficulty urinating. You have pain that gets worse or does not improve with medicine. You have blood in your urine that does not go away after 1 week of resting and drinking more fluids. You have trouble having a bowel movement. You have trouble having or keeping an erection. No semen comes  out during orgasm (dry ejaculation). You have a urinary catheter in place, and you have: Spasms or pain. Problems with your catheter or your catheter is blocked. Get help right away if: You are unable to urinate. You are having more blood clots in your urine instead of fewer. You have: Large blood clots. A lot of blood in your  urine. Pain in your back or lower abdomen. You have difficulty breathing or shortness of breath. You develop swelling or pain in your leg. These symptoms may be an emergency. Get help right away. Call 911. Do not wait to see if the symptoms will go away. Do not drive yourself to the hospital. Summary After the procedure, it is common to have a small amount of blood in your urine. Follow restrictions about lifting and sexual activity as told by your health care provider. Ask what activities are safe for you. Keep all follow-up visits. This is important. This information is not intended to replace advice given to you by your health care provider. Make sure you discuss any questions you have with your health care provider. Document Revised: 03/19/2021 Document Reviewed: 03/19/2021 Elsevier Patient Education  2024 [employer].

## 2023-02-23 LAB — SURGICAL PATHOLOGY

## 2023-03-16 ENCOUNTER — Ambulatory Visit: Payer: Medicare Other | Admitting: Nurse Practitioner

## 2023-03-19 ENCOUNTER — Other Ambulatory Visit: Payer: Self-pay | Admitting: Cardiology

## 2023-03-23 ENCOUNTER — Encounter (HOSPITAL_BASED_OUTPATIENT_CLINIC_OR_DEPARTMENT_OTHER): Payer: Medicare Other | Attending: Internal Medicine | Admitting: Internal Medicine

## 2023-03-23 ENCOUNTER — Ambulatory Visit: Payer: Medicare Other | Admitting: Nurse Practitioner

## 2023-03-23 DIAGNOSIS — I5032 Chronic diastolic (congestive) heart failure: Secondary | ICD-10-CM | POA: Diagnosis not present

## 2023-03-23 DIAGNOSIS — I130 Hypertensive heart and chronic kidney disease with heart failure and stage 1 through stage 4 chronic kidney disease, or unspecified chronic kidney disease: Secondary | ICD-10-CM | POA: Insufficient documentation

## 2023-03-23 DIAGNOSIS — L97818 Non-pressure chronic ulcer of other part of right lower leg with other specified severity: Secondary | ICD-10-CM | POA: Insufficient documentation

## 2023-03-23 DIAGNOSIS — I87331 Chronic venous hypertension (idiopathic) with ulcer and inflammation of right lower extremity: Secondary | ICD-10-CM | POA: Insufficient documentation

## 2023-03-23 DIAGNOSIS — I87322 Chronic venous hypertension (idiopathic) with inflammation of left lower extremity: Secondary | ICD-10-CM | POA: Diagnosis not present

## 2023-03-23 DIAGNOSIS — Z6834 Body mass index (BMI) 34.0-34.9, adult: Secondary | ICD-10-CM | POA: Insufficient documentation

## 2023-03-23 DIAGNOSIS — N189 Chronic kidney disease, unspecified: Secondary | ICD-10-CM | POA: Insufficient documentation

## 2023-03-23 DIAGNOSIS — I420 Dilated cardiomyopathy: Secondary | ICD-10-CM | POA: Insufficient documentation

## 2023-03-23 DIAGNOSIS — I4819 Other persistent atrial fibrillation: Secondary | ICD-10-CM | POA: Diagnosis not present

## 2023-03-24 NOTE — Progress Notes (Signed)
assistance / Altered mentation []  - 0 Support Surface(s) Assessment (bed, cushion, seat, etc.) INTERVENTIONS - Miscellaneous []  - 0 External ear exam []  - 0 Patient Transfer (multiple staff / Nurse, adult / Similar devices) []  - 0 Simple Staple / Suture removal (25 or less) []  - 0 Complex Staple / Suture removal (26 or more) []  - 0 Hypo/Hyperglycemic Management (do not check if billed separately) X- 1 15 Ankle / Brachial Index (ABI) - do not check if billed separately Has the patient been seen at the hospital within the last three years: Yes Total Score: 110 Level Of Care: New/Established - Level 3 Electronic Signature(s) Signed: 03/23/2023 5:19:19 PM By: Shawn Stall RN, BSN Entered By: Shawn Stall on 03/23/2023 05:47:29 [name] ([id number]) [id number] Page 3 of 11 -------------------------------------------------------------------------------- Compression Therapy Details Patient Name: Date of Service: [name] RD O. 03/23/2023 8:00 A M Medical Record Number: [id number] Patient Account Number: [id number] Date of Birth/Sex: Treating RN: July 01, 1956 (67 y.o. [name] Primary Care Loree Shehata: Johny Blamer Other Clinician: Referring Durene Dodge: Treating Kori Colin/Extender: Robin Searing in Treatment: 0 Compression Therapy Performed for Wound Assessment: Wound #7 Right,Anterior Lower Leg Performed By: Clinician Shawn Stall, RN Compression Type: Double Layer Post Procedure Diagnosis Same as Pre-procedure Electronic Signature(s) Signed: 03/23/2023  5:19:19 PM By: Shawn Stall RN, BSN Entered By: Shawn Stall on 03/23/2023 05:47:16 -------------------------------------------------------------------------------- Encounter Discharge Information Details Patient Name: Date of Service: [name] RD O. 03/23/2023 8:00 A M Medical Record Number: [id number] Patient Account Number: [id number] Date of Birth/Sex: Treating RN: 10-Oct-1955 (67 y.o. [name] Primary Care Audriana Aldama: Johny Blamer Other Clinician: Referring Javae Braaten: Treating Jamaris Theard/Extender: Robin Searing in Treatment: 0 Encounter Discharge Information Items Discharge Condition: Stable Ambulatory Status: Ambulatory Discharge Destination: Home Transportation: Private Auto Accompanied By: wife Schedule Follow-up Appointment: Yes Clinical Summary of Care: Electronic Signature(s) Signed: 03/23/2023 5:19:19 PM By: Shawn Stall RN, BSN Entered By: Shawn Stall on 03/23/2023 05:48:49 -------------------------------------------------------------------------------- Lower Extremity Assessment Details Patient Name: Date of Service: [name] RD O. 03/23/2023 8:00 A M Medical Record Number: [id number] Patient Account Number: [id number] Date of Birth/Sex: Treating RN: [birth year] (67 y.o. [name] Primary Care Ran Tullis: Johny Blamer Other Clinician: Referring Bretton Tandy: Treating Alverta Caccamo/Extender: Robin Searing in Treatment: [address] [place], [name] ([id number]) [id number] Page 4 of 11 Edema Assessment Assessed: [Left: No] [Right: Yes] Edema: [Left: Ye] [Right: s] Calf Left: Right: Point of Measurement: 37 cm From Medial Instep 47 cm Ankle Left: Right: Point of Measurement: 13 cm From Medial Instep 30.5 cm Knee To Floor Left: Right: From Medial Instep 48 cm Vascular Assessment Pulses: Dorsalis Pedis Palpable: [Right:Yes] Doppler Audible: [Right:Yes] Posterior  Tibial Palpable: [Right:Yes] Doppler Audible: [Right:Yes] Extremity colors, hair growth, and conditions: Extremity Color: [Right:Normal] Hair Growth on Extremity: [Right:No] Temperature of Extremity: [Right:Warm] Capillary Refill: [Right:< 3 seconds] Dependent Rubor: [Right:No] Blanched when Elevated: [Right:Yes] Lipodermatosclerosis: [Right:Yes] Blood Pressure: Brachial: [Right:115] Ankle: [Right:Dorsalis Pedis: 148 1.29] Toe Nail Assessment Left: Right: Thick: No Discolored: No Deformed: No Improper Length and Hygiene: No Electronic Signature(s) Signed: 03/23/2023 5:19:19 PM By: Shawn Stall RN, BSN Entered By: Shawn Stall on 03/23/2023 05:23:17 -------------------------------------------------------------------------------- Multi Wound Chart Details Patient Name: Date of Service: [name] RD O. 03/23/2023 8:00 A M Medical Record Number: [id number] Patient Account Number: [id number] Date of Birth/Sex: Treating RN: October 12, 1955 (67 y.o. M) Primary Care Kaizley Aja: Johny Blamer Other Clinician: Referring Valentin Benney: Treating Karstyn Birkey/Extender: Gerrit Heck,  Ricardo Jericho in Treatment: 0 Vital Signs Height(in): 76 Pulse(bpm): 72 Weight(lbs): 282 Blood Pressure(mmHg): 115/75 Body Mass Index(BMI): 34.3 Temperature(F): 98 Respiratory Rate(breaths/min): [address] ([id number]) [phone].pdf Page 5 of 11 [7:Photos:] [N/A:N/A] Right, Anterior Lower Leg N/A N/A Wound Location: Blister N/A N/A Wounding Event: Venous Leg Ulcer N/A N/A Primary Etiology: Lymphedema N/A N/A Secondary Etiology: Cataracts, Lymphedema, Sleep N/A N/A Comorbid History: Apnea, Congestive Heart Failure, Coronary Artery Disease, Hypertension, Confinement Anxiety 03/22/2021 N/A N/A Date Acquired: 0 N/A N/A Weeks of Treatment: Open N/A N/A Wound Status: No N/A N/A Wound Recurrence: Yes N/A N/A Clustered Wound: 2 N/A N/A Clustered  Quantity: 3x2.7x0.2 N/A N/A Measurements L x W x D (cm) 6.362 N/A N/A A (cm) : rea 1.272 N/A N/A Volume (cm) : Full Thickness Without Exposed N/A N/A Classification: Support Structures Large N/A N/A Exudate Amount: Serosanguineous N/A N/A Exudate Type: red, brown N/A N/A Exudate Color: Distinct, outline attached N/A N/A Wound Margin: Medium (34-66%) N/A N/A Granulation Amount: Red, Pink N/A N/A Granulation Quality: Medium (34-66%) N/A N/A Necrotic Amount: Fat Layer (Subcutaneous Tissue): Yes N/A N/A Exposed Structures: Fascia: No Tendon: No Muscle: No Joint: No Bone: No Small (1-33%) N/A N/A Epithelialization: Excoriation: No N/A N/A Periwound Skin Texture: Induration: No Callus: No Crepitus: No Rash: No Scarring: No Maceration: Yes N/A N/A Periwound Skin Moisture: Dry/Scaly: No Hemosiderin Staining: Yes N/A N/A Periwound Skin Color: Atrophie Blanche: No Cyanosis: No Ecchymosis: No Erythema: No Mottled: No Pallor: No Rubor: No Yes N/A N/A Tenderness on Palpation: Compression Therapy N/A N/A Procedures Performed: Treatment Notes Wound #7 (Lower Leg) Wound Laterality: Right, Anterior Cleanser Soap and Water Discharge Instruction: May shower and wash wound with dial antibacterial soap and water prior to dressing change. Vashe 5.8 (oz) Discharge Instruction: Cleanse the wound with Vashe prior to applying a clean dressing using gauze sponges, not tissue or cotton balls. Peri-Wound Care Triamcinolone 15 (g) Discharge Instruction: Use triamcinolone 15 (g) as directed Sween Lotion (Moisturizing lotion) Discharge Instruction: Apply moisturizing lotion as directed AYHAN, HEMP ([id number]) [id number] Page 6 of 11 Topical Primary Dressing Maxorb Extra Ag+ Alginate Dressing, 4x4.75 (in/in) Discharge Instruction: Apply to wound bed as instructed Secondary Dressing ABD Pad, 8x10 Discharge Instruction: Apply over primary  dressing as directed. Woven Gauze Sponge, Non-Sterile 4x4 in Discharge Instruction: Apply over primary dressing as directed. Zetuvit Plus 4x8 in Discharge Instruction: Apply over primary dressing as directed. Secured With Compression Wrap Urgo K2, (equivalent to a 4 layer) two layer compression system, regular Discharge Instruction: Apply Urgo K2 as directed (alternative to 4 layer compression). Compression Stockings Add-Ons Electronic Signature(s) Signed: 03/23/2023 5:34:19 PM By: Baltazar Najjar MD Entered By: Baltazar Najjar on 03/23/2023 25:85:27 -------------------------------------------------------------------------------- Multi-Disciplinary Care Plan Details Patient Name: Date of Service: [name] RD O. 03/23/2023 8:00 A M Medical Record Number: [id number] Patient Account Number: [id number] Date of Birth/Sex: Treating RN: 1956/02/14 (67 y.o. [name] Primary Care Madilyne Tadlock: Johny Blamer Other Clinician: Referring Siddiq Kaluzny: Treating Yeva Bissette/Extender: Robin Searing in Treatment: 0 Active Inactive Orientation to the Wound Care Program Nursing Diagnoses: Knowledge deficit related to the wound healing center program Goals: Patient/caregiver will verbalize understanding of the Wound Healing Center Program Date Initiated: 03/23/2023 Target Resolution Date: 04/10/2023 Goal Status: Active Interventions: Provide education on orientation to the wound center Notes: Pain, Acute or Chronic Nursing Diagnoses: Pain, acute or chronic: actual or potential Potential alteration in comfort, pain Goals: Patient will verbalize adequate pain control and receive pain control interventions  assistance / Altered mentation []  - 0 Support Surface(s) Assessment (bed, cushion, seat, etc.) INTERVENTIONS - Miscellaneous []  - 0 External ear exam []  - 0 Patient Transfer (multiple staff / Nurse, adult / Similar devices) []  - 0 Simple Staple / Suture removal (25 or less) []  - 0 Complex Staple / Suture removal (26 or more) []  - 0 Hypo/Hyperglycemic Management (do not check if billed separately) X- 1 15 Ankle / Brachial Index (ABI) - do not check if billed separately Has the patient been seen at the hospital within the last three years: Yes Total Score: 110 Level Of Care: New/Established - Level 3 Electronic Signature(s) Signed: 03/23/2023 5:19:19 PM By: Shawn Stall RN, BSN Entered By: Shawn Stall on 03/23/2023 05:47:29 [name] ([id number]) [id number] Page 3 of 11 -------------------------------------------------------------------------------- Compression Therapy Details Patient Name: Date of Service: [name] RD O. 03/23/2023 8:00 A M Medical Record Number: [id number] Patient Account Number: [id number] Date of Birth/Sex: Treating RN: July 01, 1956 (67 y.o. [name] Primary Care Loree Shehata: Johny Blamer Other Clinician: Referring Durene Dodge: Treating Kori Colin/Extender: Robin Searing in Treatment: 0 Compression Therapy Performed for Wound Assessment: Wound #7 Right,Anterior Lower Leg Performed By: Clinician Shawn Stall, RN Compression Type: Double Layer Post Procedure Diagnosis Same as Pre-procedure Electronic Signature(s) Signed: 03/23/2023  5:19:19 PM By: Shawn Stall RN, BSN Entered By: Shawn Stall on 03/23/2023 05:47:16 -------------------------------------------------------------------------------- Encounter Discharge Information Details Patient Name: Date of Service: [name] RD O. 03/23/2023 8:00 A M Medical Record Number: [id number] Patient Account Number: [id number] Date of Birth/Sex: Treating RN: 10-Oct-1955 (67 y.o. [name] Primary Care Audriana Aldama: Johny Blamer Other Clinician: Referring Javae Braaten: Treating Jamaris Theard/Extender: Robin Searing in Treatment: 0 Encounter Discharge Information Items Discharge Condition: Stable Ambulatory Status: Ambulatory Discharge Destination: Home Transportation: Private Auto Accompanied By: wife Schedule Follow-up Appointment: Yes Clinical Summary of Care: Electronic Signature(s) Signed: 03/23/2023 5:19:19 PM By: Shawn Stall RN, BSN Entered By: Shawn Stall on 03/23/2023 05:48:49 -------------------------------------------------------------------------------- Lower Extremity Assessment Details Patient Name: Date of Service: [name] RD O. 03/23/2023 8:00 A M Medical Record Number: [id number] Patient Account Number: [id number] Date of Birth/Sex: Treating RN: [birth year] (67 y.o. [name] Primary Care Ran Tullis: Johny Blamer Other Clinician: Referring Bretton Tandy: Treating Alverta Caccamo/Extender: Robin Searing in Treatment: [address] [place], [name] ([id number]) [id number] Page 4 of 11 Edema Assessment Assessed: [Left: No] [Right: Yes] Edema: [Left: Ye] [Right: s] Calf Left: Right: Point of Measurement: 37 cm From Medial Instep 47 cm Ankle Left: Right: Point of Measurement: 13 cm From Medial Instep 30.5 cm Knee To Floor Left: Right: From Medial Instep 48 cm Vascular Assessment Pulses: Dorsalis Pedis Palpable: [Right:Yes] Doppler Audible: [Right:Yes] Posterior  Tibial Palpable: [Right:Yes] Doppler Audible: [Right:Yes] Extremity colors, hair growth, and conditions: Extremity Color: [Right:Normal] Hair Growth on Extremity: [Right:No] Temperature of Extremity: [Right:Warm] Capillary Refill: [Right:< 3 seconds] Dependent Rubor: [Right:No] Blanched when Elevated: [Right:Yes] Lipodermatosclerosis: [Right:Yes] Blood Pressure: Brachial: [Right:115] Ankle: [Right:Dorsalis Pedis: 148 1.29] Toe Nail Assessment Left: Right: Thick: No Discolored: No Deformed: No Improper Length and Hygiene: No Electronic Signature(s) Signed: 03/23/2023 5:19:19 PM By: Shawn Stall RN, BSN Entered By: Shawn Stall on 03/23/2023 05:23:17 -------------------------------------------------------------------------------- Multi Wound Chart Details Patient Name: Date of Service: [name] RD O. 03/23/2023 8:00 A M Medical Record Number: [id number] Patient Account Number: [id number] Date of Birth/Sex: Treating RN: October 12, 1955 (67 y.o. M) Primary Care Kaizley Aja: Johny Blamer Other Clinician: Referring Valentin Benney: Treating Karstyn Birkey/Extender: Gerrit Heck,  ZADIEN, ISENSEE ([id number]) [id number] Page 1 of 11 Visit Report for 03/23/2023 Allergy List Details Patient Name: Date of Service: [name] RD O. 03/23/2023 8:00 A M Medical Record Number: [id number] Patient Account Number: [id number] Date of Birth/Sex: Treating RN: [birth year] (67 y.o. [name] Primary Care Dillard Pascal: Johny Blamer Other Clinician: Referring Timur Nibert: Treating Jahsiah Carpenter/Extender: Robin Searing in Treatment: 0 Allergies Active Allergies hydrocodone Reaction: rash Severity: Moderate Allergy Notes Electronic Signature(s) Signed: 03/23/2023 5:19:19 PM By: Shawn Stall RN, BSN Entered By: Shawn Stall on 03/20/2023 09:08:04 -------------------------------------------------------------------------------- Arrival Information Details Patient Name: Date of Service: [name] RD O. 03/23/2023 8:00 A M Medical Record Number: [id number] Patient Account Number: [id number] Date of Birth/Sex: Treating RN: 10-20-1955 (67 y.o. Harlon Flor, Millard.Loa Primary Care Quinlynn Cuthbert: Johny Blamer Other Clinician: Referring Vernice Bowker: Treating Marston Mccadden/Extender: Robin Searing in Treatment: 0 Visit Information Patient Arrived: Ambulatory Arrival Time: 08:06 Accompanied By: wife Transfer Assistance: None Patient Identification Verified: Yes Secondary Verification Process Completed: Yes Patient Requires Transmission-Based Precautions: No Patient Has Alerts: No History Since Last Visit Added or deleted any medications: No Any new allergies or adverse reactions: No Had a fall or experienced change in activities of daily living that may affect risk of falls: No Signs or symptoms of abuse/neglect since last visito No Hospitalized since last visit: No Implantable device outside of the clinic excluding cellular tissue based products placed in the center since last visit: No Has Dressing in Place  as Prescribed: Yes Has Compression in Place as Prescribed: No Electronic Signature(s) Signed: 03/23/2023 5:19:19 PM By: Shawn Stall RN, BSN Entered By: Shawn Stall on 03/23/2023 05:06:50 [name] ([id number]) [id number] Page 2 of 11 -------------------------------------------------------------------------------- Clinic Level of Care Assessment Details Patient Name: Date of Service: [name] RD O. 03/23/2023 8:00 A M Medical Record Number: [id number] Patient Account Number: [id number] Date of Birth/Sex: Treating RN: [birth year] (67 y.o. [name] Primary Care Mearl Olver: Johny Blamer Other Clinician: Referring Rodderick Holtzer: Treating Loel Betancur/Extender: Robin Searing in Treatment: 0 Clinic Level of Care Assessment Items TOOL 1 Quantity Score X- 1 0 Use when EandM and Procedure is performed on INITIAL visit ASSESSMENTS - Nursing Assessment / Reassessment X- 1 20 General Physical Exam (combine w/ comprehensive assessment (listed just below) when performed on new pt. evals) X- 1 25 Comprehensive Assessment (HX, ROS, Risk Assessments, Wounds Hx, etc.) ASSESSMENTS - Wound and Skin Assessment / Reassessment X- 1 10 Dermatologic / Skin Assessment (not related to wound area) ASSESSMENTS - Ostomy and/or Continence Assessment and Care []  - 0 Incontinence Assessment and Management []  - 0 Ostomy Care Assessment and Management (repouching, etc.) PROCESS - Coordination of Care X - Simple Patient / Family Education for ongoing care 1 15 []  - 0 Complex (extensive) Patient / Family Education for ongoing care X- 1 10 Staff obtains Chiropractor, Records, T Results / Process Orders est []  - 0 Staff telephones HHA, Nursing Homes / Clarify orders / etc []  - 0 Routine Transfer to another Facility (non-emergent condition) []  - 0 Routine Hospital Admission (non-emergent condition) X- 1 15 New Admissions / Ambulance person / Ordering NPWT Apligraf, etc. , []  - 0 Emergency Hospital Admission (emergent condition) PROCESS - Special Needs []  - 0 Pediatric / Minor Patient Management []  - 0 Isolation Patient Management []  - 0 Hearing / Language / Visual special needs []  - 0 Assessment of Community assistance (transportation, D/C planning, etc.) []  - 0 Additional  Ricardo Jericho in Treatment: 0 Vital Signs Height(in): 76 Pulse(bpm): 72 Weight(lbs): 282 Blood Pressure(mmHg): 115/75 Body Mass Index(BMI): 34.3 Temperature(F): 98 Respiratory Rate(breaths/min): [address] ([id number]) [phone].pdf Page 5 of 11 [7:Photos:] [N/A:N/A] Right, Anterior Lower Leg N/A N/A Wound Location: Blister N/A N/A Wounding Event: Venous Leg Ulcer N/A N/A Primary Etiology: Lymphedema N/A N/A Secondary Etiology: Cataracts, Lymphedema, Sleep N/A N/A Comorbid History: Apnea, Congestive Heart Failure, Coronary Artery Disease, Hypertension, Confinement Anxiety 03/22/2021 N/A N/A Date Acquired: 0 N/A N/A Weeks of Treatment: Open N/A N/A Wound Status: No N/A N/A Wound Recurrence: Yes N/A N/A Clustered Wound: 2 N/A N/A Clustered  Quantity: 3x2.7x0.2 N/A N/A Measurements L x W x D (cm) 6.362 N/A N/A A (cm) : rea 1.272 N/A N/A Volume (cm) : Full Thickness Without Exposed N/A N/A Classification: Support Structures Large N/A N/A Exudate Amount: Serosanguineous N/A N/A Exudate Type: red, brown N/A N/A Exudate Color: Distinct, outline attached N/A N/A Wound Margin: Medium (34-66%) N/A N/A Granulation Amount: Red, Pink N/A N/A Granulation Quality: Medium (34-66%) N/A N/A Necrotic Amount: Fat Layer (Subcutaneous Tissue): Yes N/A N/A Exposed Structures: Fascia: No Tendon: No Muscle: No Joint: No Bone: No Small (1-33%) N/A N/A Epithelialization: Excoriation: No N/A N/A Periwound Skin Texture: Induration: No Callus: No Crepitus: No Rash: No Scarring: No Maceration: Yes N/A N/A Periwound Skin Moisture: Dry/Scaly: No Hemosiderin Staining: Yes N/A N/A Periwound Skin Color: Atrophie Blanche: No Cyanosis: No Ecchymosis: No Erythema: No Mottled: No Pallor: No Rubor: No Yes N/A N/A Tenderness on Palpation: Compression Therapy N/A N/A Procedures Performed: Treatment Notes Wound #7 (Lower Leg) Wound Laterality: Right, Anterior Cleanser Soap and Water Discharge Instruction: May shower and wash wound with dial antibacterial soap and water prior to dressing change. Vashe 5.8 (oz) Discharge Instruction: Cleanse the wound with Vashe prior to applying a clean dressing using gauze sponges, not tissue or cotton balls. Peri-Wound Care Triamcinolone 15 (g) Discharge Instruction: Use triamcinolone 15 (g) as directed Sween Lotion (Moisturizing lotion) Discharge Instruction: Apply moisturizing lotion as directed AYHAN, HEMP ([id number]) [id number] Page 6 of 11 Topical Primary Dressing Maxorb Extra Ag+ Alginate Dressing, 4x4.75 (in/in) Discharge Instruction: Apply to wound bed as instructed Secondary Dressing ABD Pad, 8x10 Discharge Instruction: Apply over primary  dressing as directed. Woven Gauze Sponge, Non-Sterile 4x4 in Discharge Instruction: Apply over primary dressing as directed. Zetuvit Plus 4x8 in Discharge Instruction: Apply over primary dressing as directed. Secured With Compression Wrap Urgo K2, (equivalent to a 4 layer) two layer compression system, regular Discharge Instruction: Apply Urgo K2 as directed (alternative to 4 layer compression). Compression Stockings Add-Ons Electronic Signature(s) Signed: 03/23/2023 5:34:19 PM By: Baltazar Najjar MD Entered By: Baltazar Najjar on 03/23/2023 25:85:27 -------------------------------------------------------------------------------- Multi-Disciplinary Care Plan Details Patient Name: Date of Service: [name] RD O. 03/23/2023 8:00 A M Medical Record Number: [id number] Patient Account Number: [id number] Date of Birth/Sex: Treating RN: 1956/02/14 (67 y.o. [name] Primary Care Madilyne Tadlock: Johny Blamer Other Clinician: Referring Siddiq Kaluzny: Treating Yeva Bissette/Extender: Robin Searing in Treatment: 0 Active Inactive Orientation to the Wound Care Program Nursing Diagnoses: Knowledge deficit related to the wound healing center program Goals: Patient/caregiver will verbalize understanding of the Wound Healing Center Program Date Initiated: 03/23/2023 Target Resolution Date: 04/10/2023 Goal Status: Active Interventions: Provide education on orientation to the wound center Notes: Pain, Acute or Chronic Nursing Diagnoses: Pain, acute or chronic: actual or potential Potential alteration in comfort, pain Goals: Patient will verbalize adequate pain control and receive pain control interventions

## 2023-03-24 NOTE — Progress Notes (Signed)
[name], [name] ([id number]) [id number] Page 1 of 4 Visit Report for 03/23/2023 Abuse Risk Screen Details Patient Name: Date of Service: [name] RD O. 03/23/2023 8:00 A M Medical Record Number: [id number] Patient Account Number: [id number] Date of Birth/Sex: Treating RN: 11-15-1955 (68 y.o. [name] Primary Care Xadrian Craighead: Johny Blamer Other Clinician: Referring Jyquan Kenley: Treating Dalyn Becker/Extender: Robin Searing in Treatment: 0 Abuse Risk Screen Items Answer ABUSE RISK SCREEN: Has anyone close to you tried to hurt or harm you recentlyo No Do you feel uncomfortable with anyone in your familyo No Has anyone forced you do things that you didnt want to doo No Electronic Signature(s) Signed: 03/23/2023 5:19:19 PM By: Shawn Stall RN, BSN Entered By: Shawn Stall on 03/23/2023 05:07:33 -------------------------------------------------------------------------------- Activities of Daily Living Details Patient Name: Date of Service: [facility] RD O. 03/23/2023 8:00 A M Medical Record Number: [id number] Patient Account Number: [id number] Date of Birth/Sex: Treating RN: [birth year] (67 y.o. [name] Primary Care Boleslaw Borghi: Johny Blamer Other Clinician: Referring Adeana Grilliot: Treating Ziyan Schoon/Extender: Robin Searing in Treatment: 0 Activities of Daily Living Items Answer Activities of Daily Living (Please select one for each item) Drive Automobile Completely Able T Medications ake Completely Able Use T elephone Completely Able Care for Appearance Completely Able Use T oilet Completely Able Bath / Shower Completely Able Dress Self Completely Able Feed Self Completely Able Walk Completely Able Get In / Out Bed Completely Able Housework Completely Able Prepare Meals Completely Able Handle Money Completely Able Shop for Self Completely Able Electronic  Signature(s) Signed: 03/23/2023 5:19:19 PM By: Shawn Stall RN, BSN Entered By: Shawn Stall on 03/23/2023 05:07:25 [name] ([id number]) [id number] Page 2 of 4 -------------------------------------------------------------------------------- Education Screening Details Patient Name: Date of Service: [name] RD O. 03/23/2023 8:00 A M Medical Record Number: [id number] Patient Account Number: [id number] Date of Birth/Sex: Treating RN: 04/27/1956 (67 y.o. [name] Primary Care Stillman Buenger: Johny Blamer Other Clinician: Referring Chonda Baney: Treating Juley Giovanetti/Extender: Robin Searing in Treatment: 0 Primary Learner Assessed: Patient Learning Preferences/Education Level/Primary Language Learning Preference: Explanation, Demonstration, Printed Material Highest Education Level: College or Above Preferred Language: English Cognitive Barrier Language Barrier: No Translator Needed: No Memory Deficit: No Emotional Barrier: No Cultural/Religious Beliefs Affecting Medical Care: No Physical Barrier Impaired Vision: No Impaired Hearing: No Decreased Hand dexterity: No Knowledge/Comprehension Knowledge Level: High Comprehension Level: High Ability to understand written instructions: High Ability to understand verbal instructions: High Motivation Anxiety Level: Calm Cooperation: Cooperative Education Importance: Acknowledges Need Interest in Health Problems: Asks Questions Perception: Coherent Willingness to Engage in Self-Management High Activities: Readiness to Engage in Self-Management High Activities: Electronic Signature(s) Signed: 03/23/2023 5:19:19 PM By: Shawn Stall RN, BSN Entered By: Shawn Stall on 03/23/2023 05:07:55 -------------------------------------------------------------------------------- Fall Risk Assessment Details Patient Name: Date of Service: [name] RD O. 03/23/2023  8:00 A M Medical Record Number: [id number] Patient Account Number: [id number] Date of Birth/Sex: Treating RN: May 04, 1956 (67 y.o. [name] Primary Care Maren Wiesen: Johny Blamer Other Clinician: Referring Meylin Stenzel: Treating Kirsty Monjaraz/Extender: Robin Searing in Treatment: 0 Fall Risk Assessment Items Have you had 2 or more falls in the last 12 [name], [name] ([id number]) [phone] [id number] Page 3 of 4 Have you had any fall that resulted in injury in the last 12 monthso 0 Yes FALLS RISK SCREEN History of falling - immediate or within 3 months 25 Yes Secondary diagnosis (  Do you have 2 or more medical diagnoseso) 0 No Ambulatory aid None/bed rest/wheelchair/nurse 0 Yes Crutches/cane/walker 0 No Furniture 0 No Intravenous therapy Access/Saline/Heparin Lock 0 No Gait/Transferring Normal/ bed rest/ wheelchair 0 Yes Weak (short steps with or without shuffle, stooped but able to lift head while walking, may seek 0 No support from furniture) Impaired (short steps with shuffle, may have difficulty arising from chair, head down, impaired 0 No balance) Mental Status Oriented to own ability 0 Yes Electronic Signature(s) Signed: 03/23/2023 5:19:19 PM By: Shawn Stall RN, BSN Entered By: Shawn Stall on 03/23/2023 05:08:20 -------------------------------------------------------------------------------- Foot Assessment Details Patient Name: Date of Service: [name] RD O. 03/23/2023 8:00 A M Medical Record Number: [id number] Patient Account Number: [id number] Date of Birth/Sex: Treating RN: 1956-05-22 (67 y.o. [name] Primary Care Evely Gainey: Johny Blamer Other Clinician: Referring Teira Arcilla: Treating Desyre Calma/Extender: Robin Searing in Treatment: 0 Foot Assessment Items Site Locations + = Sensation present, - = Sensation absent, C = Callus, U = Ulcer R = Redness, W =  Warmth, M = Maceration, PU = Pre-ulcerative lesion F = Fissure, S = Swelling, D = Dryness Assessment Right: Left: Other Deformity: No No Prior Foot Ulcer: No No Prior Amputation: No No Charcot Joint: No No Ambulatory Status: Ambulatory Without Help GaitVERDIS, [name] ([id number]) [phone] [id number] Page 4 of 4 Electronic Signature(s) Signed: 03/23/2023 5:19:19 PM By: Shawn Stall RN, BSN Entered By: Shawn Stall on 03/23/2023 05:30:06 -------------------------------------------------------------------------------- Nutrition Risk Screening Details Patient Name: Date of Service: [facility] RD O. 03/23/2023 8:00 A M Medical Record Number: [id number] Patient Account Number: [id number] Date of Birth/Sex: Treating RN: [birth year] (67 y.o. [name], Millard.Loa Primary Care Kemoni Quesenberry: Johny Blamer Other Clinician: Referring Lancelot Alyea: Treating Ramaj Frangos/Extender: Robin Searing in Treatment: 0 Height (in): 76 Weight (lbs): 326 Body Mass Index (BMI): 39.7 Nutrition Risk Screening Items Score Screening NUTRITION RISK SCREEN: I have an illness or condition that made me change the kind and/or amount of food I eat 2 Yes I eat fewer than two meals per day 0 No I eat few fruits and vegetables, or milk products 0 No I have three or more drinks of beer, liquor or wine almost every day 0 No I have tooth or mouth problems that make it hard for me to eat 0 No I don't always have enough money to buy the food I need 0 No I eat alone most of the time 0 No I take three or more different prescribed or over-the-counter drugs a day 1 Yes Without wanting to, I have lost or gained 10 pounds in the last six months 0 No I am not always physically able to shop, cook and/or feed myself 2 Yes Nutrition Protocols Good Risk Protocol Moderate Risk Protocol 0 Provide education on nutrition High Risk Proctocol Risk Level: Moderate Risk Score:  5 Electronic Signature(s) Signed: 03/23/2023 5:19:19 PM By: Shawn Stall RN, BSN Entered By: Shawn Stall on 03/23/2023 05:08:39

## 2023-03-24 NOTE — Progress Notes (Signed)
Entered By: Baltazar Najjar on 03/23/2023 09:36:35 -------------------------------------------------------------------------------- Physician Orders Details Patient Name: Date of Service: [name] 03/23/2023 8:00 A M Medical Record Number: [id number] Patient Account Number: [id number] Date of Birth/Sex: Treating RN: May 30, 1956 (67 y.[name] [name] Primary Care Provider: Johny [name] Other Clinician: Referring Provider: Treating Provider/Extender: [name] in Treatment:  0 Verbal / Phone Orders: No Diagnosis Coding Follow-up Appointments ppointment in 1 week. - Dr. Leanord Hawking Monday 1230pm room 8 03/30/2023 Return A ppointment in 2 weeks. - Dr. Leanord Hawking 04/06/2023 215pm room 8 Return A Return appointment in 3 weeks. - Dr. Leanord Hawking ****Front office schedule****** Nurse Visit: - This Friday 03/27/2023 0845 room 8 Anesthetic (In clinic) Topical Lidocaine 4% applied to wound bed Bathing/ Shower/ Hygiene May shower with protection but do not get wound dressing(s) wet. Protect dressing(s) with water repellant cover (for example, large plastic bag) or a cast cover and may then take shower. Edema Control - Lymphedema / SCD / Other Elevate legs to the level of the heart or above for 30 minutes daily and/or when sitting for 3-4 times a day throughout the day. Avoid standing for long periods of time. Exercise regularly Moisturize legs daily. - left leg Compression stocking or Garment 30-40 mm/Hg pressure to: - left leg- apply in the morning and remove at night. will apply tubigrip size E to left leg till you get home, remove, and apply your compression stockings. Wound Treatment Wound #7 - Lower Leg Wound Laterality: Right, Anterior Cleanser: Soap and Water 2 x Per Week/30 Days Discharge Instructions: May shower and wash wound with dial antibacterial soap and water prior to dressing change. Cleanser: Vashe 5.8 (oz) 2 x Per Week/30 Days Discharge Instructions: Cleanse the wound with Vashe prior to applying a clean dressing using gauze sponges, not tissue or cotton balls. Peri-Wound Care: Triamcinolone 15 (g) 2 x Per Week/30 Days Discharge Instructions: Use triamcinolone 15 (g) as directed Peri-Wound Care: Sween Lotion (Moisturizing lotion) 2 x Per Week/30 Days Discharge Instructions: Apply moisturizing lotion as directed Prim Dressing: Maxorb Extra Ag+ Alginate Dressing, 4x4.75 (in/in) 2 x Per Week/30 Days ary Discharge Instructions: Apply to wound bed as  instructed Secondary Dressing: ABD Pad, 8x10 2 x Per Week/30 Days Discharge Instructions: Apply over primary dressing as directed. Secondary Dressing: Woven Gauze Sponge, Non-Sterile 4x4 in 2 x Per Week/30 Days Discharge Instructions: Apply over primary dressing as directed. Secondary Dressing: Zetuvit Plus 4x8 in 2 x Per Week/30 Days Discharge Instructions: Apply over primary dressing as directed. Compression Wrap: Urgo K2, (equivalent to a 4 layer) two layer compression system, regular 2 x Per Week/30 Days Discharge Instructions: Apply Urgo K2 as directed (alternative to 4 layer compression). [name], [name] ([id number]) [id number] Page 4 of 11 Patient Medications llergies: hydrocodone A Notifications Medication Indication Start End applied to wound prior lidocaine to debridement. DOSE topical 4 % cream - cream topical once daily stasis dermatitis 03/23/2023 triamcinolone acetonide DOSE topical 0.1 % cream - cream topical once daily 1/4 in cetaphil cream Electronic Signature(s) Signed: 03/23/2023 9:41:15 AM By: Baltazar Najjar MD Entered By: Baltazar Najjar on 03/23/2023 09:41:14 Prescription 03/23/2023 -------------------------------------------------------------------------------- [name] Baltazar Najjar MD Patient Name: Provider: [birth year] [id number] Date of Birth: NPI#: Judie Petit [id number] Sex: DEA #: [phone] Phone #: License #: [id number] UPN: Patient Address: [phone] [facility] [place], [place] [address] [address]rd Floor [place], [place] [address] [phone] Allergies hydrocodone  Electronic Signature(s) Signed: 03/24/2023 5:50:35 PM By: Shawn Stall RN, BSN Signed: 03/26/2023 4:42:34 PM By: Baltazar Najjar MD Previous Signature: 03/23/2023 5:34:19 PM Version By: Baltazar Najjar MD Entered By: Shawn Stall on 03/24/2023 17:46:25 -------------------------------------------------------------------------------- HxROS Details Patient Name: Date of Service: [name] RD [name] 03/23/2023 8:00 A M Medical Record Number:  [id number] Patient Account Number: [id number] [name], [name] ([id number]) [id number] Page 9 of 11 Date of Birth/Sex: Treating RN: 1955/12/10 (65 y.[name] [name] Primary Care Provider: Johny [name] Other Clinician: Referring Provider: Treating Provider/Extender: [name] in Treatment: 0 Information Obtained From Patient Eyes Medical History: Positive for: Cataracts - 2021 Ear/Nose/Mouth/Throat Medical History: Negative for: Chronic sinus problems/congestion; Middle ear problems Hematologic/Lymphatic Medical History: Positive for: Lymphedema Respiratory Medical History: Positive for: Sleep Apnea Past Medical History Notes: Allergic Rhinitis Cardiovascular Medical History: Positive for: Congestive Heart Failure; Coronary Artery Disease; Hypertension Negative for: Deep Vein Thrombosis Past Medical History Notes: AFIB Gastrointestinal Medical History: Negative for: Cirrhosis ; Colitis; Crohns Endocrine Medical History: Negative for: Type I Diabetes; Type II Diabetes Genitourinary Medical History: Negative for: End Stage Renal Disease Past Medical History Notes: renal failure Immunological Medical History: Negative for: Raynauds; Scleroderma Integumentary (Skin) Medical History: Negative for: History of Burn Musculoskeletal Medical History: Negative for: Gout; Rheumatoid Arthritis; Osteoarthritis; Osteomyelitis Neurologic Medical History: Negative for: Dementia; Neuropathy; Quadriplegia; Paraplegia; Seizure Disorder Oncologic Medical History: Negative for: Received Chemotherapy; Received Radiation [name], [name] ([id number]) [id number] Page 10 of 11 Psychiatric Medical History: Positive for: Confinement Anxiety Negative for: Anorexia/bulimia HBO Extended History Items Eyes: Cataracts Immunizations Pneumococcal Vaccine: Received Pneumococcal Vaccination:  Yes Received Pneumococcal Vaccination On or After 60th Birthday: Yes Implantable Devices None Hospitalization / Surgery History Type of Hospitalization/Surgery Inguinal Hernia repair- 2014 insertion of mesh-2014 Atrial ablation surgery- 2007 BIla eyes lasik- 2008 hernia repair- 1977 enlarge prostate sx 02/22/2023 Family and Social History Cancer: Yes - Father,Mother; Diabetes: No; Heart Disease: No; Hereditary Spherocytosis: No; Hypertension: Yes - Mother,Father; Kidney Disease: No; Lung Disease: No; Seizures: No; Thyroid Problems: No; Tuberculosis: No; Never smoker; Marital Status - Married; Alcohol Use: Never; Drug Use: No History; Caffeine Use: Daily - MTN dew; Financial Concerns: No; Food, Clothing or Shelter Needs: No; Support System Lacking: No; Transportation Concerns: No Electronic Signature(s) Signed: 03/23/2023 5:19:19 PM By: Shawn Stall RN, BSN Signed: 03/23/2023 5:34:19 PM By: Baltazar Najjar MD Entered By: Shawn Stall on 03/23/2023 08:09:49 -------------------------------------------------------------------------------- SuperBill Details Patient Name: Date of Service: [name] RD [name] 03/23/2023 Medical Record Number: [id number] Patient Account Number: [id number] Date of Birth/Sex: Treating RN: 1955/07/29 (24 y.[name] Harlon Flor, Millard.Loa Primary Care Provider: Johny [name] Other Clinician: Referring Provider: Treating Provider/Extender: [name] in Treatment: 0 Diagnosis Coding ICD-10 Codes Code Description [phone] Chronic venous hypertension (idiopathic) with ulcer and inflammation of right lower extremity I87.322 Chronic venous hypertension (idiopathic) with inflammation of left lower extremity L97.818 Non-pressure chronic ulcer of other part of right lower leg with other specified severity I50.32 Chronic diastolic (congestive) heart failure Facility Procedures : CPT4 Code: 32440102 Description: 99213 - WOUND CARE VISIT-LEV 3 EST  PT Modifier: Quantity: 1 Physician Procedures : CPT4 Code Description Modifier [id number] 99214 - WC PHYS LEVEL 4 - EST PT ICD-10 Diagnosis Description I87.331 Chronic venous hypertension (idiopathic) with ulcer and inflammation of right lower extremity L97.818 Non-pressure chronic ulcer of other part  of right lower leg with other specified severity I87.322 Chronic venous hypertension (idiopathic) with inflammation of left lower extremity I50.32 Chronic  Electronic Signature(s) Signed: 03/24/2023 5:50:35 PM By: Shawn Stall RN, BSN Signed: 03/26/2023 4:42:34 PM By: Baltazar Najjar MD Previous Signature: 03/23/2023 5:34:19 PM Version By: Baltazar Najjar MD Entered By: Shawn Stall on 03/24/2023 17:46:25 -------------------------------------------------------------------------------- HxROS Details Patient Name: Date of Service: [name] RD [name] 03/23/2023 8:00 A M Medical Record Number:  [id number] Patient Account Number: [id number] [name], [name] ([id number]) [id number] Page 9 of 11 Date of Birth/Sex: Treating RN: 1955/12/10 (65 y.[name] [name] Primary Care Provider: Johny [name] Other Clinician: Referring Provider: Treating Provider/Extender: [name] in Treatment: 0 Information Obtained From Patient Eyes Medical History: Positive for: Cataracts - 2021 Ear/Nose/Mouth/Throat Medical History: Negative for: Chronic sinus problems/congestion; Middle ear problems Hematologic/Lymphatic Medical History: Positive for: Lymphedema Respiratory Medical History: Positive for: Sleep Apnea Past Medical History Notes: Allergic Rhinitis Cardiovascular Medical History: Positive for: Congestive Heart Failure; Coronary Artery Disease; Hypertension Negative for: Deep Vein Thrombosis Past Medical History Notes: AFIB Gastrointestinal Medical History: Negative for: Cirrhosis ; Colitis; Crohns Endocrine Medical History: Negative for: Type I Diabetes; Type II Diabetes Genitourinary Medical History: Negative for: End Stage Renal Disease Past Medical History Notes: renal failure Immunological Medical History: Negative for: Raynauds; Scleroderma Integumentary (Skin) Medical History: Negative for: History of Burn Musculoskeletal Medical History: Negative for: Gout; Rheumatoid Arthritis; Osteoarthritis; Osteomyelitis Neurologic Medical History: Negative for: Dementia; Neuropathy; Quadriplegia; Paraplegia; Seizure Disorder Oncologic Medical History: Negative for: Received Chemotherapy; Received Radiation [name], [name] ([id number]) [id number] Page 10 of 11 Psychiatric Medical History: Positive for: Confinement Anxiety Negative for: Anorexia/bulimia HBO Extended History Items Eyes: Cataracts Immunizations Pneumococcal Vaccine: Received Pneumococcal Vaccination:  Yes Received Pneumococcal Vaccination On or After 60th Birthday: Yes Implantable Devices None Hospitalization / Surgery History Type of Hospitalization/Surgery Inguinal Hernia repair- 2014 insertion of mesh-2014 Atrial ablation surgery- 2007 BIla eyes lasik- 2008 hernia repair- 1977 enlarge prostate sx 02/22/2023 Family and Social History Cancer: Yes - Father,Mother; Diabetes: No; Heart Disease: No; Hereditary Spherocytosis: No; Hypertension: Yes - Mother,Father; Kidney Disease: No; Lung Disease: No; Seizures: No; Thyroid Problems: No; Tuberculosis: No; Never smoker; Marital Status - Married; Alcohol Use: Never; Drug Use: No History; Caffeine Use: Daily - MTN dew; Financial Concerns: No; Food, Clothing or Shelter Needs: No; Support System Lacking: No; Transportation Concerns: No Electronic Signature(s) Signed: 03/23/2023 5:19:19 PM By: Shawn Stall RN, BSN Signed: 03/23/2023 5:34:19 PM By: Baltazar Najjar MD Entered By: Shawn Stall on 03/23/2023 08:09:49 -------------------------------------------------------------------------------- SuperBill Details Patient Name: Date of Service: [name] RD [name] 03/23/2023 Medical Record Number: [id number] Patient Account Number: [id number] Date of Birth/Sex: Treating RN: 1955/07/29 (24 y.[name] Harlon Flor, Millard.Loa Primary Care Provider: Johny [name] Other Clinician: Referring Provider: Treating Provider/Extender: [name] in Treatment: 0 Diagnosis Coding ICD-10 Codes Code Description [phone] Chronic venous hypertension (idiopathic) with ulcer and inflammation of right lower extremity I87.322 Chronic venous hypertension (idiopathic) with inflammation of left lower extremity L97.818 Non-pressure chronic ulcer of other part of right lower leg with other specified severity I50.32 Chronic diastolic (congestive) heart failure Facility Procedures : CPT4 Code: 32440102 Description: 99213 - WOUND CARE VISIT-LEV 3 EST  PT Modifier: Quantity: 1 Physician Procedures : CPT4 Code Description Modifier [id number] 99214 - WC PHYS LEVEL 4 - EST PT ICD-10 Diagnosis Description I87.331 Chronic venous hypertension (idiopathic) with ulcer and inflammation of right lower extremity L97.818 Non-pressure chronic ulcer of other part  of right lower leg with other specified severity I87.322 Chronic venous hypertension (idiopathic) with inflammation of left lower extremity I50.32 Chronic  Entered By: Baltazar Najjar on 03/23/2023 09:36:35 -------------------------------------------------------------------------------- Physician Orders Details Patient Name: Date of Service: [name] 03/23/2023 8:00 A M Medical Record Number: [id number] Patient Account Number: [id number] Date of Birth/Sex: Treating RN: May 30, 1956 (67 y.[name] [name] Primary Care Provider: Johny [name] Other Clinician: Referring Provider: Treating Provider/Extender: [name] in Treatment:  0 Verbal / Phone Orders: No Diagnosis Coding Follow-up Appointments ppointment in 1 week. - Dr. Leanord Hawking Monday 1230pm room 8 03/30/2023 Return A ppointment in 2 weeks. - Dr. Leanord Hawking 04/06/2023 215pm room 8 Return A Return appointment in 3 weeks. - Dr. Leanord Hawking ****Front office schedule****** Nurse Visit: - This Friday 03/27/2023 0845 room 8 Anesthetic (In clinic) Topical Lidocaine 4% applied to wound bed Bathing/ Shower/ Hygiene May shower with protection but do not get wound dressing(s) wet. Protect dressing(s) with water repellant cover (for example, large plastic bag) or a cast cover and may then take shower. Edema Control - Lymphedema / SCD / Other Elevate legs to the level of the heart or above for 30 minutes daily and/or when sitting for 3-4 times a day throughout the day. Avoid standing for long periods of time. Exercise regularly Moisturize legs daily. - left leg Compression stocking or Garment 30-40 mm/Hg pressure to: - left leg- apply in the morning and remove at night. will apply tubigrip size E to left leg till you get home, remove, and apply your compression stockings. Wound Treatment Wound #7 - Lower Leg Wound Laterality: Right, Anterior Cleanser: Soap and Water 2 x Per Week/30 Days Discharge Instructions: May shower and wash wound with dial antibacterial soap and water prior to dressing change. Cleanser: Vashe 5.8 (oz) 2 x Per Week/30 Days Discharge Instructions: Cleanse the wound with Vashe prior to applying a clean dressing using gauze sponges, not tissue or cotton balls. Peri-Wound Care: Triamcinolone 15 (g) 2 x Per Week/30 Days Discharge Instructions: Use triamcinolone 15 (g) as directed Peri-Wound Care: Sween Lotion (Moisturizing lotion) 2 x Per Week/30 Days Discharge Instructions: Apply moisturizing lotion as directed Prim Dressing: Maxorb Extra Ag+ Alginate Dressing, 4x4.75 (in/in) 2 x Per Week/30 Days ary Discharge Instructions: Apply to wound bed as  instructed Secondary Dressing: ABD Pad, 8x10 2 x Per Week/30 Days Discharge Instructions: Apply over primary dressing as directed. Secondary Dressing: Woven Gauze Sponge, Non-Sterile 4x4 in 2 x Per Week/30 Days Discharge Instructions: Apply over primary dressing as directed. Secondary Dressing: Zetuvit Plus 4x8 in 2 x Per Week/30 Days Discharge Instructions: Apply over primary dressing as directed. Compression Wrap: Urgo K2, (equivalent to a 4 layer) two layer compression system, regular 2 x Per Week/30 Days Discharge Instructions: Apply Urgo K2 as directed (alternative to 4 layer compression). [name], [name] ([id number]) [id number] Page 4 of 11 Patient Medications llergies: hydrocodone A Notifications Medication Indication Start End applied to wound prior lidocaine to debridement. DOSE topical 4 % cream - cream topical once daily stasis dermatitis 03/23/2023 triamcinolone acetonide DOSE topical 0.1 % cream - cream topical once daily 1/4 in cetaphil cream Electronic Signature(s) Signed: 03/23/2023 9:41:15 AM By: Baltazar Najjar MD Entered By: Baltazar Najjar on 03/23/2023 09:41:14 Prescription 03/23/2023 -------------------------------------------------------------------------------- [name] Baltazar Najjar MD Patient Name: Provider: [birth year] [id number] Date of Birth: NPI#: Judie Petit [id number] Sex: DEA #: [phone] Phone #: License #: [id number] UPN: Patient Address: [address] [facility] [place], [place] [address] [address] 3[address] Floor [place], [place] [address] [phone] Allergies hydrocodone  Entered By: Baltazar Najjar on 03/23/2023 09:36:35 -------------------------------------------------------------------------------- Physician Orders Details Patient Name: Date of Service: [name] 03/23/2023 8:00 A M Medical Record Number: [id number] Patient Account Number: [id number] Date of Birth/Sex: Treating RN: May 30, 1956 (67 y.[name] [name] Primary Care Provider: Johny [name] Other Clinician: Referring Provider: Treating Provider/Extender: [name] in Treatment:  0 Verbal / Phone Orders: No Diagnosis Coding Follow-up Appointments ppointment in 1 week. - Dr. Leanord Hawking Monday 1230pm room 8 03/30/2023 Return A ppointment in 2 weeks. - Dr. Leanord Hawking 04/06/2023 215pm room 8 Return A Return appointment in 3 weeks. - Dr. Leanord Hawking ****Front office schedule****** Nurse Visit: - This Friday 03/27/2023 0845 room 8 Anesthetic (In clinic) Topical Lidocaine 4% applied to wound bed Bathing/ Shower/ Hygiene May shower with protection but do not get wound dressing(s) wet. Protect dressing(s) with water repellant cover (for example, large plastic bag) or a cast cover and may then take shower. Edema Control - Lymphedema / SCD / Other Elevate legs to the level of the heart or above for 30 minutes daily and/or when sitting for 3-4 times a day throughout the day. Avoid standing for long periods of time. Exercise regularly Moisturize legs daily. - left leg Compression stocking or Garment 30-40 mm/Hg pressure to: - left leg- apply in the morning and remove at night. will apply tubigrip size E to left leg till you get home, remove, and apply your compression stockings. Wound Treatment Wound #7 - Lower Leg Wound Laterality: Right, Anterior Cleanser: Soap and Water 2 x Per Week/30 Days Discharge Instructions: May shower and wash wound with dial antibacterial soap and water prior to dressing change. Cleanser: Vashe 5.8 (oz) 2 x Per Week/30 Days Discharge Instructions: Cleanse the wound with Vashe prior to applying a clean dressing using gauze sponges, not tissue or cotton balls. Peri-Wound Care: Triamcinolone 15 (g) 2 x Per Week/30 Days Discharge Instructions: Use triamcinolone 15 (g) as directed Peri-Wound Care: Sween Lotion (Moisturizing lotion) 2 x Per Week/30 Days Discharge Instructions: Apply moisturizing lotion as directed Prim Dressing: Maxorb Extra Ag+ Alginate Dressing, 4x4.75 (in/in) 2 x Per Week/30 Days ary Discharge Instructions: Apply to wound bed as  instructed Secondary Dressing: ABD Pad, 8x10 2 x Per Week/30 Days Discharge Instructions: Apply over primary dressing as directed. Secondary Dressing: Woven Gauze Sponge, Non-Sterile 4x4 in 2 x Per Week/30 Days Discharge Instructions: Apply over primary dressing as directed. Secondary Dressing: Zetuvit Plus 4x8 in 2 x Per Week/30 Days Discharge Instructions: Apply over primary dressing as directed. Compression Wrap: Urgo K2, (equivalent to a 4 layer) two layer compression system, regular 2 x Per Week/30 Days Discharge Instructions: Apply Urgo K2 as directed (alternative to 4 layer compression). [name], [name] ([id number]) [id number] Page 4 of 11 Patient Medications llergies: hydrocodone A Notifications Medication Indication Start End applied to wound prior lidocaine to debridement. DOSE topical 4 % cream - cream topical once daily stasis dermatitis 03/23/2023 triamcinolone acetonide DOSE topical 0.1 % cream - cream topical once daily 1/4 in cetaphil cream Electronic Signature(s) Signed: 03/23/2023 9:41:15 AM By: Baltazar Najjar MD Entered By: Baltazar Najjar on 03/23/2023 09:41:14 Prescription 03/23/2023 -------------------------------------------------------------------------------- [name] Baltazar Najjar MD Patient Name: Provider: [birth year] [id number] Date of Birth: NPI#: Judie Petit [id number] Sex: DEA #: [phone] Phone #: License #: [id number] UPN: Patient Address: [address] [facility] [place], [place] [address] [address] Floor [place], [place] [address] [phone] Allergies hydrocodone  Entered By: Baltazar Najjar on 03/23/2023 09:36:35 -------------------------------------------------------------------------------- Physician Orders Details Patient Name: Date of Service: [name] 03/23/2023 8:00 A M Medical Record Number: [id number] Patient Account Number: [id number] Date of Birth/Sex: Treating RN: May 30, 1956 (67 y.[name] [name] Primary Care Provider: Johny [name] Other Clinician: Referring Provider: Treating Provider/Extender: [name] in Treatment:  0 Verbal / Phone Orders: No Diagnosis Coding Follow-up Appointments ppointment in 1 week. - Dr. Leanord Hawking Monday 1230pm room 8 03/30/2023 Return A ppointment in 2 weeks. - Dr. Leanord Hawking 04/06/2023 215pm room 8 Return A Return appointment in 3 weeks. - Dr. Leanord Hawking ****Front office schedule****** Nurse Visit: - This Friday 03/27/2023 0845 room 8 Anesthetic (In clinic) Topical Lidocaine 4% applied to wound bed Bathing/ Shower/ Hygiene May shower with protection but do not get wound dressing(s) wet. Protect dressing(s) with water repellant cover (for example, large plastic bag) or a cast cover and may then take shower. Edema Control - Lymphedema / SCD / Other Elevate legs to the level of the heart or above for 30 minutes daily and/or when sitting for 3-4 times a day throughout the day. Avoid standing for long periods of time. Exercise regularly Moisturize legs daily. - left leg Compression stocking or Garment 30-40 mm/Hg pressure to: - left leg- apply in the morning and remove at night. will apply tubigrip size E to left leg till you get home, remove, and apply your compression stockings. Wound Treatment Wound #7 - Lower Leg Wound Laterality: Right, Anterior Cleanser: Soap and Water 2 x Per Week/30 Days Discharge Instructions: May shower and wash wound with dial antibacterial soap and water prior to dressing change. Cleanser: Vashe 5.8 (oz) 2 x Per Week/30 Days Discharge Instructions: Cleanse the wound with Vashe prior to applying a clean dressing using gauze sponges, not tissue or cotton balls. Peri-Wound Care: Triamcinolone 15 (g) 2 x Per Week/30 Days Discharge Instructions: Use triamcinolone 15 (g) as directed Peri-Wound Care: Sween Lotion (Moisturizing lotion) 2 x Per Week/30 Days Discharge Instructions: Apply moisturizing lotion as directed Prim Dressing: Maxorb Extra Ag+ Alginate Dressing, 4x4.75 (in/in) 2 x Per Week/30 Days ary Discharge Instructions: Apply to wound bed as  instructed Secondary Dressing: ABD Pad, 8x10 2 x Per Week/30 Days Discharge Instructions: Apply over primary dressing as directed. Secondary Dressing: Woven Gauze Sponge, Non-Sterile 4x4 in 2 x Per Week/30 Days Discharge Instructions: Apply over primary dressing as directed. Secondary Dressing: Zetuvit Plus 4x8 in 2 x Per Week/30 Days Discharge Instructions: Apply over primary dressing as directed. Compression Wrap: Urgo K2, (equivalent to a 4 layer) two layer compression system, regular 2 x Per Week/30 Days Discharge Instructions: Apply Urgo K2 as directed (alternative to 4 layer compression). [name], [name] ([id number]) [id number] Page 4 of 11 Patient Medications llergies: hydrocodone A Notifications Medication Indication Start End applied to wound prior lidocaine to debridement. DOSE topical 4 % cream - cream topical once daily stasis dermatitis 03/23/2023 triamcinolone acetonide DOSE topical 0.1 % cream - cream topical once daily 1/4 in cetaphil cream Electronic Signature(s) Signed: 03/23/2023 9:41:15 AM By: Baltazar Najjar MD Entered By: Baltazar Najjar on 03/23/2023 09:41:14 Prescription 03/23/2023 -------------------------------------------------------------------------------- [name] Baltazar Najjar MD Patient Name: Provider: [birth year] [id number] Date of Birth: NPI#: Judie Petit [id number] Sex: DEA #: [phone] Phone #: License #: [id number] UPN: Patient Address: [address] [facility] [address] [facility] [place], [place] [address] [address] 3rd Floor [place], [place] [address] [phone] Allergies hydrocodone  Electronic Signature(s) Signed: 03/24/2023 5:50:35 PM By: Shawn Stall RN, BSN Signed: 03/26/2023 4:42:34 PM By: Baltazar Najjar MD Previous Signature: 03/23/2023 5:34:19 PM Version By: Baltazar Najjar MD Entered By: Shawn Stall on 03/24/2023 17:46:25 -------------------------------------------------------------------------------- HxROS Details Patient Name: Date of Service: [name] RD [name] 03/23/2023 8:00 A M Medical Record Number:  [id number] Patient Account Number: [id number] [name], [name] ([id number]) [id number] Page 9 of 11 Date of Birth/Sex: Treating RN: 1955/12/10 (65 y.[name] [name] Primary Care Provider: Johny [name] Other Clinician: Referring Provider: Treating Provider/Extender: [name] in Treatment: 0 Information Obtained From Patient Eyes Medical History: Positive for: Cataracts - 2021 Ear/Nose/Mouth/Throat Medical History: Negative for: Chronic sinus problems/congestion; Middle ear problems Hematologic/Lymphatic Medical History: Positive for: Lymphedema Respiratory Medical History: Positive for: Sleep Apnea Past Medical History Notes: Allergic Rhinitis Cardiovascular Medical History: Positive for: Congestive Heart Failure; Coronary Artery Disease; Hypertension Negative for: Deep Vein Thrombosis Past Medical History Notes: AFIB Gastrointestinal Medical History: Negative for: Cirrhosis ; Colitis; Crohns Endocrine Medical History: Negative for: Type I Diabetes; Type II Diabetes Genitourinary Medical History: Negative for: End Stage Renal Disease Past Medical History Notes: renal failure Immunological Medical History: Negative for: Raynauds; Scleroderma Integumentary (Skin) Medical History: Negative for: History of Burn Musculoskeletal Medical History: Negative for: Gout; Rheumatoid Arthritis; Osteoarthritis; Osteomyelitis Neurologic Medical History: Negative for: Dementia; Neuropathy; Quadriplegia; Paraplegia; Seizure Disorder Oncologic Medical History: Negative for: Received Chemotherapy; Received Radiation [name], [name] ([id number]) [id number] Page 10 of 11 Psychiatric Medical History: Positive for: Confinement Anxiety Negative for: Anorexia/bulimia HBO Extended History Items Eyes: Cataracts Immunizations Pneumococcal Vaccine: Received Pneumococcal Vaccination:  Yes Received Pneumococcal Vaccination On or After 60th Birthday: Yes Implantable Devices None Hospitalization / Surgery History Type of Hospitalization/Surgery Inguinal Hernia repair- 2014 insertion of mesh-2014 Atrial ablation surgery- 2007 BIla eyes lasik- 2008 hernia repair- 1977 enlarge prostate sx 02/22/2023 Family and Social History Cancer: Yes - Father,Mother; Diabetes: No; Heart Disease: No; Hereditary Spherocytosis: No; Hypertension: Yes - Mother,Father; Kidney Disease: No; Lung Disease: No; Seizures: No; Thyroid Problems: No; Tuberculosis: No; Never smoker; Marital Status - Married; Alcohol Use: Never; Drug Use: No History; Caffeine Use: Daily - MTN dew; Financial Concerns: No; Food, Clothing or Shelter Needs: No; Support System Lacking: No; Transportation Concerns: No Electronic Signature(s) Signed: 03/23/2023 5:19:19 PM By: Shawn Stall RN, BSN Signed: 03/23/2023 5:34:19 PM By: Baltazar Najjar MD Entered By: Shawn Stall on 03/23/2023 08:09:49 -------------------------------------------------------------------------------- SuperBill Details Patient Name: Date of Service: [name] RD [name] 03/23/2023 Medical Record Number: [id number] Patient Account Number: [id number] Date of Birth/Sex: Treating RN: 1955/07/29 (24 y.[name] Harlon Flor, Millard.Loa Primary Care Provider: Johny [name] Other Clinician: Referring Provider: Treating Provider/Extender: [name] in Treatment: 0 Diagnosis Coding ICD-10 Codes Code Description [phone] Chronic venous hypertension (idiopathic) with ulcer and inflammation of right lower extremity I87.322 Chronic venous hypertension (idiopathic) with inflammation of left lower extremity L97.818 Non-pressure chronic ulcer of other part of right lower leg with other specified severity I50.32 Chronic diastolic (congestive) heart failure Facility Procedures : CPT4 Code: 32440102 Description: 99213 - WOUND CARE VISIT-LEV 3 EST  PT Modifier: Quantity: 1 Physician Procedures : CPT4 Code Description Modifier [id number] 99214 - WC PHYS LEVEL 4 - EST PT ICD-10 Diagnosis Description I87.331 Chronic venous hypertension (idiopathic) with ulcer and inflammation of right lower extremity L97.818 Non-pressure chronic ulcer of other part  of right lower leg with other specified severity I87.322 Chronic venous hypertension (idiopathic) with inflammation of left lower extremity I50.32 Chronic  Entered By: Baltazar Najjar on 03/23/2023 09:36:35 -------------------------------------------------------------------------------- Physician Orders Details Patient Name: Date of Service: [name] 03/23/2023 8:00 A M Medical Record Number: [id number] Patient Account Number: [id number] Date of Birth/Sex: Treating RN: May 30, 1956 (67 y.[name] [name] Primary Care Provider: Johny [name] Other Clinician: Referring Provider: Treating Provider/Extender: [name] in Treatment:  0 Verbal / Phone Orders: No Diagnosis Coding Follow-up Appointments ppointment in 1 week. - Dr. Leanord Hawking Monday 1230pm room 8 03/30/2023 Return A ppointment in 2 weeks. - Dr. Leanord Hawking 04/06/2023 215pm room 8 Return A Return appointment in 3 weeks. - Dr. Leanord Hawking ****Front office schedule****** Nurse Visit: - This Friday 03/27/2023 0845 room 8 Anesthetic (In clinic) Topical Lidocaine 4% applied to wound bed Bathing/ Shower/ Hygiene May shower with protection but do not get wound dressing(s) wet. Protect dressing(s) with water repellant cover (for example, large plastic bag) or a cast cover and may then take shower. Edema Control - Lymphedema / SCD / Other Elevate legs to the level of the heart or above for 30 minutes daily and/or when sitting for 3-4 times a day throughout the day. Avoid standing for long periods of time. Exercise regularly Moisturize legs daily. - left leg Compression stocking or Garment 30-40 mm/Hg pressure to: - left leg- apply in the morning and remove at night. will apply tubigrip size E to left leg till you get home, remove, and apply your compression stockings. Wound Treatment Wound #7 - Lower Leg Wound Laterality: Right, Anterior Cleanser: Soap and Water 2 x Per Week/30 Days Discharge Instructions: May shower and wash wound with dial antibacterial soap and water prior to dressing change. Cleanser: Vashe 5.8 (oz) 2 x Per Week/30 Days Discharge Instructions: Cleanse the wound with Vashe prior to applying a clean dressing using gauze sponges, not tissue or cotton balls. Peri-Wound Care: Triamcinolone 15 (g) 2 x Per Week/30 Days Discharge Instructions: Use triamcinolone 15 (g) as directed Peri-Wound Care: Sween Lotion (Moisturizing lotion) 2 x Per Week/30 Days Discharge Instructions: Apply moisturizing lotion as directed Prim Dressing: Maxorb Extra Ag+ Alginate Dressing, 4x4.75 (in/in) 2 x Per Week/30 Days ary Discharge Instructions: Apply to wound bed as  instructed Secondary Dressing: ABD Pad, 8x10 2 x Per Week/30 Days Discharge Instructions: Apply over primary dressing as directed. Secondary Dressing: Woven Gauze Sponge, Non-Sterile 4x4 in 2 x Per Week/30 Days Discharge Instructions: Apply over primary dressing as directed. Secondary Dressing: Zetuvit Plus 4x8 in 2 x Per Week/30 Days Discharge Instructions: Apply over primary dressing as directed. Compression Wrap: Urgo K2, (equivalent to a 4 layer) two layer compression system, regular 2 x Per Week/30 Days Discharge Instructions: Apply Urgo K2 as directed (alternative to 4 layer compression). [name], [name] ([id number]) [id number] Page 4 of 11 Patient Medications llergies: hydrocodone A Notifications Medication Indication Start End applied to wound prior lidocaine to debridement. DOSE topical 4 % cream - cream topical once daily stasis dermatitis 03/23/2023 triamcinolone acetonide DOSE topical 0.1 % cream - cream topical once daily 1/4 in cetaphil cream Electronic Signature(s) Signed: 03/23/2023 9:41:15 AM By: Baltazar Najjar MD Entered By: Baltazar Najjar on 03/23/2023 09:41:14 Prescription 03/23/2023 -------------------------------------------------------------------------------- [name] Baltazar Najjar MD Patient Name: Provider: [birth year] [id number] Date of Birth: NPI#: Judie Petit [id number] Sex: DEA #: [phone] Phone #: License #: [id number] UPN: Patient Address: [phone] [facility] [address] [facility] [place], [place] [address] [address] 3rd Floor [place], [place] [address] [phone] Allergies hydrocodone  Electronic Signature(s) Signed: 03/24/2023 5:50:35 PM By: Shawn Stall RN, BSN Signed: 03/26/2023 4:42:34 PM By: Baltazar Najjar MD Previous Signature: 03/23/2023 5:34:19 PM Version By: Baltazar Najjar MD Entered By: Shawn Stall on 03/24/2023 17:46:25 -------------------------------------------------------------------------------- HxROS Details Patient Name: Date of Service: [name] RD [name] 03/23/2023 8:00 A M Medical Record Number:  [id number] Patient Account Number: [id number] [name], [name] ([id number]) [id number] Page 9 of 11 Date of Birth/Sex: Treating RN: 1955/12/10 (65 y.[name] [name] Primary Care Provider: Johny [name] Other Clinician: Referring Provider: Treating Provider/Extender: [name] in Treatment: 0 Information Obtained From Patient Eyes Medical History: Positive for: Cataracts - 2021 Ear/Nose/Mouth/Throat Medical History: Negative for: Chronic sinus problems/congestion; Middle ear problems Hematologic/Lymphatic Medical History: Positive for: Lymphedema Respiratory Medical History: Positive for: Sleep Apnea Past Medical History Notes: Allergic Rhinitis Cardiovascular Medical History: Positive for: Congestive Heart Failure; Coronary Artery Disease; Hypertension Negative for: Deep Vein Thrombosis Past Medical History Notes: AFIB Gastrointestinal Medical History: Negative for: Cirrhosis ; Colitis; Crohns Endocrine Medical History: Negative for: Type I Diabetes; Type II Diabetes Genitourinary Medical History: Negative for: End Stage Renal Disease Past Medical History Notes: renal failure Immunological Medical History: Negative for: Raynauds; Scleroderma Integumentary (Skin) Medical History: Negative for: History of Burn Musculoskeletal Medical History: Negative for: Gout; Rheumatoid Arthritis; Osteoarthritis; Osteomyelitis Neurologic Medical History: Negative for: Dementia; Neuropathy; Quadriplegia; Paraplegia; Seizure Disorder Oncologic Medical History: Negative for: Received Chemotherapy; Received Radiation [name], [name] ([id number]) [id number] Page 10 of 11 Psychiatric Medical History: Positive for: Confinement Anxiety Negative for: Anorexia/bulimia HBO Extended History Items Eyes: Cataracts Immunizations Pneumococcal Vaccine: Received Pneumococcal Vaccination:  Yes Received Pneumococcal Vaccination On or After 60th Birthday: Yes Implantable Devices None Hospitalization / Surgery History Type of Hospitalization/Surgery Inguinal Hernia repair- 2014 insertion of mesh-2014 Atrial ablation surgery- 2007 BIla eyes lasik- 2008 hernia repair- 1977 enlarge prostate sx 02/22/2023 Family and Social History Cancer: Yes - Father,Mother; Diabetes: No; Heart Disease: No; Hereditary Spherocytosis: No; Hypertension: Yes - Mother,Father; Kidney Disease: No; Lung Disease: No; Seizures: No; Thyroid Problems: No; Tuberculosis: No; Never smoker; Marital Status - Married; Alcohol Use: Never; Drug Use: No History; Caffeine Use: Daily - MTN dew; Financial Concerns: No; Food, Clothing or Shelter Needs: No; Support System Lacking: No; Transportation Concerns: No Electronic Signature(s) Signed: 03/23/2023 5:19:19 PM By: Shawn Stall RN, BSN Signed: 03/23/2023 5:34:19 PM By: Baltazar Najjar MD Entered By: Shawn Stall on 03/23/2023 08:09:49 -------------------------------------------------------------------------------- SuperBill Details Patient Name: Date of Service: [name] RD [name] 03/23/2023 Medical Record Number: [id number] Patient Account Number: [id number] Date of Birth/Sex: Treating RN: 1955/07/29 (24 y.[name] Harlon Flor, Millard.Loa Primary Care Provider: Johny [name] Other Clinician: Referring Provider: Treating Provider/Extender: [name] in Treatment: 0 Diagnosis Coding ICD-10 Codes Code Description [phone] Chronic venous hypertension (idiopathic) with ulcer and inflammation of right lower extremity I87.322 Chronic venous hypertension (idiopathic) with inflammation of left lower extremity L97.818 Non-pressure chronic ulcer of other part of right lower leg with other specified severity I50.32 Chronic diastolic (congestive) heart failure Facility Procedures : CPT4 Code: 32440102 Description: 99213 - WOUND CARE VISIT-LEV 3 EST  PT Modifier: Quantity: 1 Physician Procedures : CPT4 Code Description Modifier [id number] 99214 - WC PHYS LEVEL 4 - EST PT ICD-10 Diagnosis Description I87.331 Chronic venous hypertension (idiopathic) with ulcer and inflammation of right lower extremity L97.818 Non-pressure chronic ulcer of other part  of right lower leg with other specified severity I87.322 Chronic venous hypertension (idiopathic) with inflammation of left lower extremity I50.32 Chronic  Electronic Signature(s) Signed: 03/24/2023 5:50:35 PM By: Shawn Stall RN, BSN Signed: 03/26/2023 4:42:34 PM By: Baltazar Najjar MD Previous Signature: 03/23/2023 5:34:19 PM Version By: Baltazar Najjar MD Entered By: Shawn Stall on 03/24/2023 17:46:25 -------------------------------------------------------------------------------- HxROS Details Patient Name: Date of Service: [name] RD [name] 03/23/2023 8:00 A M Medical Record Number:  [id number] Patient Account Number: [id number] [name], [name] ([id number]) [id number] Page 9 of 11 Date of Birth/Sex: Treating RN: 1955/12/10 (65 y.[name] [name] Primary Care Provider: Johny [name] Other Clinician: Referring Provider: Treating Provider/Extender: [name] in Treatment: 0 Information Obtained From Patient Eyes Medical History: Positive for: Cataracts - 2021 Ear/Nose/Mouth/Throat Medical History: Negative for: Chronic sinus problems/congestion; Middle ear problems Hematologic/Lymphatic Medical History: Positive for: Lymphedema Respiratory Medical History: Positive for: Sleep Apnea Past Medical History Notes: Allergic Rhinitis Cardiovascular Medical History: Positive for: Congestive Heart Failure; Coronary Artery Disease; Hypertension Negative for: Deep Vein Thrombosis Past Medical History Notes: AFIB Gastrointestinal Medical History: Negative for: Cirrhosis ; Colitis; Crohns Endocrine Medical History: Negative for: Type I Diabetes; Type II Diabetes Genitourinary Medical History: Negative for: End Stage Renal Disease Past Medical History Notes: renal failure Immunological Medical History: Negative for: Raynauds; Scleroderma Integumentary (Skin) Medical History: Negative for: History of Burn Musculoskeletal Medical History: Negative for: Gout; Rheumatoid Arthritis; Osteoarthritis; Osteomyelitis Neurologic Medical History: Negative for: Dementia; Neuropathy; Quadriplegia; Paraplegia; Seizure Disorder Oncologic Medical History: Negative for: Received Chemotherapy; Received Radiation [name], [name] ([id number]) [id number] Page 10 of 11 Psychiatric Medical History: Positive for: Confinement Anxiety Negative for: Anorexia/bulimia HBO Extended History Items Eyes: Cataracts Immunizations Pneumococcal Vaccine: Received Pneumococcal Vaccination:  Yes Received Pneumococcal Vaccination On or After 60th Birthday: Yes Implantable Devices None Hospitalization / Surgery History Type of Hospitalization/Surgery Inguinal Hernia repair- 2014 insertion of mesh-2014 Atrial ablation surgery- 2007 BIla eyes lasik- 2008 hernia repair- 1977 enlarge prostate sx 02/22/2023 Family and Social History Cancer: Yes - Father,Mother; Diabetes: No; Heart Disease: No; Hereditary Spherocytosis: No; Hypertension: Yes - Mother,Father; Kidney Disease: No; Lung Disease: No; Seizures: No; Thyroid Problems: No; Tuberculosis: No; Never smoker; Marital Status - Married; Alcohol Use: Never; Drug Use: No History; Caffeine Use: Daily - MTN dew; Financial Concerns: No; Food, Clothing or Shelter Needs: No; Support System Lacking: No; Transportation Concerns: No Electronic Signature(s) Signed: 03/23/2023 5:19:19 PM By: Shawn Stall RN, BSN Signed: 03/23/2023 5:34:19 PM By: Baltazar Najjar MD Entered By: Shawn Stall on 03/23/2023 08:09:49 -------------------------------------------------------------------------------- SuperBill Details Patient Name: Date of Service: [name] RD [name] 03/23/2023 Medical Record Number: [id number] Patient Account Number: [id number] Date of Birth/Sex: Treating RN: 1955/07/29 (24 y.[name] Harlon Flor, Millard.Loa Primary Care Provider: Johny [name] Other Clinician: Referring Provider: Treating Provider/Extender: [name] in Treatment: 0 Diagnosis Coding ICD-10 Codes Code Description [phone] Chronic venous hypertension (idiopathic) with ulcer and inflammation of right lower extremity I87.322 Chronic venous hypertension (idiopathic) with inflammation of left lower extremity L97.818 Non-pressure chronic ulcer of other part of right lower leg with other specified severity I50.32 Chronic diastolic (congestive) heart failure Facility Procedures : CPT4 Code: 32440102 Description: 99213 - WOUND CARE VISIT-LEV 3 EST  PT Modifier: Quantity: 1 Physician Procedures : CPT4 Code Description Modifier [id number] 99214 - WC PHYS LEVEL 4 - EST PT ICD-10 Diagnosis Description I87.331 Chronic venous hypertension (idiopathic) with ulcer and inflammation of right lower extremity L97.818 Non-pressure chronic ulcer of other part  of right lower leg with other specified severity I87.322 Chronic venous hypertension (idiopathic) with inflammation of left lower extremity I50.32 Chronic  Entered By: Baltazar Najjar on 03/23/2023 09:36:35 -------------------------------------------------------------------------------- Physician Orders Details Patient Name: Date of Service: [name] 03/23/2023 8:00 A M Medical Record Number: [id number] Patient Account Number: [id number] Date of Birth/Sex: Treating RN: May 30, 1956 (67 y.[name] [name] Primary Care Provider: Johny [name] Other Clinician: Referring Provider: Treating Provider/Extender: [name] in Treatment:  0 Verbal / Phone Orders: No Diagnosis Coding Follow-up Appointments ppointment in 1 week. - Dr. Leanord Hawking Monday 1230pm room 8 03/30/2023 Return A ppointment in 2 weeks. - Dr. Leanord Hawking 04/06/2023 215pm room 8 Return A Return appointment in 3 weeks. - Dr. Leanord Hawking ****Front office schedule****** Nurse Visit: - This Friday 03/27/2023 0845 room 8 Anesthetic (In clinic) Topical Lidocaine 4% applied to wound bed Bathing/ Shower/ Hygiene May shower with protection but do not get wound dressing(s) wet. Protect dressing(s) with water repellant cover (for example, large plastic bag) or a cast cover and may then take shower. Edema Control - Lymphedema / SCD / Other Elevate legs to the level of the heart or above for 30 minutes daily and/or when sitting for 3-4 times a day throughout the day. Avoid standing for long periods of time. Exercise regularly Moisturize legs daily. - left leg Compression stocking or Garment 30-40 mm/Hg pressure to: - left leg- apply in the morning and remove at night. will apply tubigrip size E to left leg till you get home, remove, and apply your compression stockings. Wound Treatment Wound #7 - Lower Leg Wound Laterality: Right, Anterior Cleanser: Soap and Water 2 x Per Week/30 Days Discharge Instructions: May shower and wash wound with dial antibacterial soap and water prior to dressing change. Cleanser: Vashe 5.8 (oz) 2 x Per Week/30 Days Discharge Instructions: Cleanse the wound with Vashe prior to applying a clean dressing using gauze sponges, not tissue or cotton balls. Peri-Wound Care: Triamcinolone 15 (g) 2 x Per Week/30 Days Discharge Instructions: Use triamcinolone 15 (g) as directed Peri-Wound Care: Sween Lotion (Moisturizing lotion) 2 x Per Week/30 Days Discharge Instructions: Apply moisturizing lotion as directed Prim Dressing: Maxorb Extra Ag+ Alginate Dressing, 4x4.75 (in/in) 2 x Per Week/30 Days ary Discharge Instructions: Apply to wound bed as  instructed Secondary Dressing: ABD Pad, 8x10 2 x Per Week/30 Days Discharge Instructions: Apply over primary dressing as directed. Secondary Dressing: Woven Gauze Sponge, Non-Sterile 4x4 in 2 x Per Week/30 Days Discharge Instructions: Apply over primary dressing as directed. Secondary Dressing: Zetuvit Plus 4x8 in 2 x Per Week/30 Days Discharge Instructions: Apply over primary dressing as directed. Compression Wrap: Urgo K2, (equivalent to a 4 layer) two layer compression system, regular 2 x Per Week/30 Days Discharge Instructions: Apply Urgo K2 as directed (alternative to 4 layer compression). [name], [name] ([id number]) [id number] Page 4 of 11 Patient Medications llergies: hydrocodone A Notifications Medication Indication Start End applied to wound prior lidocaine to debridement. DOSE topical 4 % cream - cream topical once daily stasis dermatitis 03/23/2023 triamcinolone acetonide DOSE topical 0.1 % cream - cream topical once daily 1/4 in cetaphil cream Electronic Signature(s) Signed: 03/23/2023 9:41:15 AM By: Baltazar Najjar MD Entered By: Baltazar Najjar on 03/23/2023 09:41:14 Prescription 03/23/2023 -------------------------------------------------------------------------------- [name] Baltazar Najjar MD Patient Name: Provider: [birth year] [id number] Date of Birth: NPI#: Judie Petit [id number] Sex: DEA #: [phone] Phone #: License #: [id number] UPN: Patient Address: [address] [facility] [place], [place] [address] [address] 3[address] Floor [place], [place] [address] [phone] Allergies hydrocodone

## 2023-03-27 ENCOUNTER — Encounter (HOSPITAL_BASED_OUTPATIENT_CLINIC_OR_DEPARTMENT_OTHER): Payer: Medicare Other | Admitting: General Surgery

## 2023-03-27 DIAGNOSIS — I87331 Chronic venous hypertension (idiopathic) with ulcer and inflammation of right lower extremity: Secondary | ICD-10-CM | POA: Diagnosis not present

## 2023-03-27 NOTE — Progress Notes (Signed)
KERIC, LANCLOS ([id number]) [id number] Page 1 of 1 Visit Report for 03/27/2023 SuperBill Details Patient Name: Date of Service: [name] RD O. 03/27/2023 Medical Record Number: [id number] Patient Account Number: [id number] Date of Birth/Sex: Treating RN: 1955/08/17 (67 y.o. M) Primary Care Provider: Johny Blamer Other Clinician: Referring Provider: Treating Provider/Extender: Lyda Kalata in Treatment: 0 Diagnosis Coding ICD-10 Codes Code Description [phone] Chronic venous hypertension (idiopathic) with ulcer and inflammation of right lower extremity I87.322 Chronic venous hypertension (idiopathic) with inflammation of left lower extremity L97.818 Non-pressure chronic ulcer of other part of right lower leg with other specified severity I50.32 Chronic diastolic (congestive) heart failure Facility Procedures CPT4 Code Description Modifier Quantity [id number] (Facility Use Only) [phone] - APPLY MULTLAY COMPRS LWR RT LEG 1 Electronic Signature(s) Signed: 03/27/2023 9:30:23 AM By: Duanne Guess MD FACS Signed: 03/27/2023 12:48:07 PM By: Thayer Dallas Entered By: Thayer Dallas on 03/27/2023 52:84:13

## 2023-03-27 NOTE — Progress Notes (Signed)
[name], [name] ([id number]) [id number] Page 1 of 4 Visit Report for 03/27/2023 Arrival Information Details Patient Name: Date of Service: [name] RD O. 03/27/2023 8:45 A M Medical Record Number: [id number] Patient Account Number: [id number] Date of Birth/Sex: Treating RN: 01-27-1956 (67 y.o. M) Primary Care Pascale Maves: Johny Blamer Other Clinician: Referring Evart Mcdonnell: Treating Himani Corona/Extender: Lyda Kalata in Treatment: 0 Visit Information History Since Last Visit Added or deleted any medications: No Patient Arrived: Ambulatory Any new allergies or adverse reactions: No Arrival Time: 08:49 Had a fall or experienced change in No Accompanied By: wife activities of daily living that may affect Transfer Assistance: None risk of falls: Patient Identification Verified: Yes Signs or symptoms of abuse/neglect since last visito No Secondary Verification Process Completed: Yes Hospitalized since last visit: No Patient Requires Transmission-Based Precautions: No Implantable device outside of the clinic excluding No Patient Has Alerts: No cellular tissue based products placed in the center since last visit: Has Dressing in Place as Prescribed: Yes Has Compression in Place as Prescribed: Yes Pain Present Now: No Electronic Signature(s) Signed: 03/27/2023 12:48:07 PM By: Thayer Dallas Entered By: Thayer Dallas on 03/27/2023 05:50:20 -------------------------------------------------------------------------------- Compression Therapy Details Patient Name: Date of Service: [name] RD O. 03/27/2023 8:45 A M Medical Record Number: [id number] Patient Account Number: [id number] Date of Birth/Sex: Treating RN: 1955-09-09 (67 y.o. M) Primary Care Sarha Bartelt: Johny Blamer Other Clinician: Referring Audry Kauzlarich: Treating Grason Brailsford/Extender: Lyda Kalata in Treatment: 0 Compression Therapy Performed  for Wound Assessment: Wound #7 Right,Anterior Lower Leg Performed By: Clinician Thayer Dallas, Compression Type: Double Layer Electronic Signature(s) Signed: 03/27/2023 12:48:07 PM By: Thayer Dallas Entered By: Thayer Dallas on 03/27/2023 06:21:46 -------------------------------------------------------------------------------- Encounter Discharge Information Details Patient Name: Date of Service: [name] RD O. 03/27/2023 8:45 A M Medical Record Number: [id number] Patient Account Number: [id number] [name], [name] ([id number]) [id number] Page 2 of 4 Date of Birth/Sex: Treating RN: 1955/11/15 (67 y.o. M) Primary Care Gasper Hopes: Johny Blamer Other Clinician: Thayer Dallas Referring Sarya Linenberger: Treating Folasade Mooty/Extender: Lyda Kalata in Treatment: 0 Encounter Discharge Information Items Discharge Condition: Stable Ambulatory Status: Ambulatory Discharge Destination: Home Transportation: Private Auto Accompanied By: wife Schedule Follow-up Appointment: Yes Clinical Summary of Care: Electronic Signature(s) Signed: 03/27/2023 12:48:07 PM By: Thayer Dallas Entered By: Thayer Dallas on 03/27/2023 06:23:03 -------------------------------------------------------------------------------- Patient/Caregiver Education Details Patient Name: Date of Service: [name] RD O. 9/20/2024andnbsp8:45 A M Medical Record Number: [id number] Patient Account Number: [id number] Date of Birth/Gender: Treating RN: Aug 13, 1955 (67 y.o. M) Primary Care Physician: Johny Blamer Other Clinician: Thayer Dallas Referring Physician: Treating Physician/Extender: Lyda Kalata in Treatment: 0 Education Assessment Education Provided To: Patient Education Topics Provided Electronic Signature(s) Signed: 03/27/2023 12:48:07 PM By: Thayer Dallas Entered By: Thayer Dallas on 03/27/2023  06:22:34 -------------------------------------------------------------------------------- Wound Assessment Details Patient Name: Date of Service: [name] RD O. 03/27/2023 8:45 A M Medical Record Number: [id number] Patient Account Number: [id number] Date of Birth/Sex: Treating RN: [birth year] (67 y.o. M) Primary Care Ezrah Dembeck: Johny Blamer Other Clinician: Referring Rilei Kravitz: Treating Shaquoya Cosper/Extender: Lyda Kalata in Treatment: 0 Wound Status Wound Number: 7 Primary Etiology: Venous Leg Ulcer Wound Location: Right, Anterior Lower Leg Secondary Etiology: Lymphedema Wounding Event: Blister Wound Status: Open Date Acquired: 03/22/2021 Weeks Of Treatment: 0 Clustered Wound: Yes Wound Measurements [name], [name] ([id number]) Length: (cm) 3 Width: (cm) 2.7 Depth: (cm) 0.2 Area: (cm) 6.362 Volume: (cm) 1.272 [id number] Page 3 of  4 % Reduction in Area: 0% % Reduction in Volume: 0% Wound Description Classification: Full Thickness Without Exposed Suppor Exudate Amount: Large Exudate Type: Serosanguineous Exudate Color: red, brown t Structures Periwound Skin Texture Texture Color No Abnormalities Noted: No No Abnormalities Noted: No Moisture No Abnormalities Noted: No Treatment Notes Wound #7 (Lower Leg) Wound Laterality: Right, Anterior Cleanser Soap and Water Discharge Instruction: May shower and wash wound with dial antibacterial soap and water prior to dressing change. Vashe 5.8 (oz) Discharge Instruction: Cleanse the wound with Vashe prior to applying a clean dressing using gauze sponges, not tissue or cotton balls. Peri-Wound Care Triamcinolone 15 (g) Discharge Instruction: Use triamcinolone 15 (g) as directed Sween Lotion (Moisturizing lotion) Discharge Instruction: Apply moisturizing lotion as directed Topical Primary Dressing Maxorb Extra Ag+ Alginate Dressing, 4x4.75 (in/in) Discharge  Instruction: Apply to wound bed as instructed Secondary Dressing ABD Pad, 8x10 Discharge Instruction: Apply over primary dressing as directed. Woven Gauze Sponge, Non-Sterile 4x4 in Discharge Instruction: Apply over primary dressing as directed. Zetuvit Plus 4x8 in Discharge Instruction: Apply over primary dressing as directed. Secured With Compression Wrap Urgo K2, (equivalent to a 4 layer) two layer compression system, regular Discharge Instruction: Apply Urgo K2 as directed (alternative to 4 layer compression). Compression Stockings Add-Ons Electronic Signature(s) Signed: 03/27/2023 12:48:07 PM By: Thayer Dallas Entered By: Thayer Dallas on 03/27/2023 05:50:45 -------------------------------------------------------------------------------- Vitals Details Patient Name: Date of Service: [name] RD O. 03/27/2023 8:45 A [name] ([id number]) [id number] Page 4 of 4 Medical Record Number: [id number] Patient Account Number: [id number] Date of Birth/Sex: Treating RN: 06-01-1956 (67 y.o. M) Primary Care Trevaris Pennella: Johny Blamer Other Clinician: Referring Cecilie Heidel: Treating Ramonda Galyon/Extender: Lyda Kalata in Treatment: 0 Vital Signs Time Taken: 08:50 Reference Range: 80 - 120 mg / dl Height (in): 76 Weight (lbs): 282 Body Mass Index (BMI): 34.3 Electronic Signature(s) Signed: 03/27/2023 12:48:07 PM By: Thayer Dallas Entered By: Thayer Dallas on 03/27/2023 05:50:36

## 2023-03-30 ENCOUNTER — Encounter (HOSPITAL_BASED_OUTPATIENT_CLINIC_OR_DEPARTMENT_OTHER): Payer: Medicare Other | Admitting: Internal Medicine

## 2023-03-30 DIAGNOSIS — I87331 Chronic venous hypertension (idiopathic) with ulcer and inflammation of right lower extremity: Secondary | ICD-10-CM | POA: Diagnosis not present

## 2023-03-30 NOTE — Progress Notes (Signed)
[name]: Referring [name]: Treating [name]/Extender: [name] in Treatment: 1 Vital Signs Height(in): 76 Pulse(bpm): 65 Weight(lbs): 282 Blood Pressure(mmHg): 170/98 Body Mass Index(BMI): 34.3 Temperature(F): 98.3 Respiratory Rate(breaths/min): 20 [7:Photos:] [N/A:N/A] Right, Anterior Lower Leg N/A N/A Wound Location: Blister N/A N/A Wounding Event: Venous Leg Ulcer N/A N/A Primary Etiology: Lymphedema N/A N/A Secondary Etiology: Cataracts, Lymphedema, Sleep N/A N/A Comorbid History: Apnea, Congestive Heart Failure, Coronary Artery Disease, Hypertension, Confinement Anxiety 03/22/2021 N/A N/A Date Acquired: 1 N/A N/A Weeks of Treatment: Open N/A N/A Wound Status: No N/A N/A Wound Recurrence: [name], [name] ([id number]) [id number] Page 5 of 8 Yes N/A N/A Clustered Wound: 0 N/A N/A Clustered Quantity: 0x0x0 N/A N/A Measurements L x W x D (cm) 0 N/A N/A A (cm) : rea 0 N/A N/A Volume (cm) : 100.00% N/A N/A % Reduction in Area: 100.00% N/A N/A % Reduction in Volume: Full Thickness Without Exposed N/A N/A Classification: Support Structures None Present N/A N/A Exudate Amount: None Present (0%) N/A N/A Granulation Amount: None Present (0%) N/A N/A Necrotic Amount: Fascia: No N/A N/A Exposed Structures: Fat Layer (Subcutaneous Tissue):  No Tendon: No Muscle: No Joint: No Bone: No Large (67-100%) N/A N/A Epithelialization: Excoriation: No N/A N/A Periwound Skin Texture: Induration: No Callus: No Crepitus: No Rash: No Scarring: No Maceration: No N/A N/A Periwound Skin Moisture: Dry/Scaly: No Atrophie Blanche: No N/A N/A Periwound Skin Color: Cyanosis: No Ecchymosis: No Erythema: No Hemosiderin Staining: No Mottled: No Pallor: No Rubor: No Treatment Notes Electronic Signature(s) Signed: 03/30/2023 3:56:56 PM By: Clinton Najjar MD Entered By: [name] on 03/30/2023 10:41:04 -------------------------------------------------------------------------------- Multi-Disciplinary Care Plan Details Patient Name: Date of Service: [name] RD O. 03/30/2023 12:30 PM Medical Record Number: [id number] Patient Account Number: [id number] Date of Birth/Sex: Treating RN: [birth year] (67 y.o. [name] Primary Care [name]: [name]: Referring [name]: Treating [name]: [name] in Treatment: 1 Active Inactive Electronic Signature(s) Signed: 03/30/2023 4:21:40 PM By: Clinton Stall RN, BSN Entered By: [name] on 03/30/2023 10:20:23 -------------------------------------------------------------------------------- Pain Assessment Details Patient Name: Date of Service: [name] RD O. 03/30/2023 12:30 PM [name] ([id number]) [id number] Page 6 of 8 Medical Record Number: [id number] Patient Account Number: [id number] Date of Birth/Sex: Treating RN: 1956-06-01 (67 y.o. [name] Primary Care [name]: [name]: Referring Likisha [name]: Treating [name]/Extender: [name] in Treatment: 1 Active Problems Location of Pain Severity and Description of Pain Patient Has Paino No Site Locations Pain Management and Medication Current Pain  Management: Electronic Signature(s) Signed: 03/30/2023 4:21:40 PM By: Clinton Stall RN, BSN Entered By: [name] on 03/30/2023 09:59:46 -------------------------------------------------------------------------------- Patient/Caregiver Education Details Patient Name: Date of Service: [name] RD O. 9/23/2024andnbsp12:30 PM Medical Record Number: [id number] Patient Account Number: [id number] Date of Birth/Gender: Treating RN: 1956/04/18 (67 y.o. [name] Primary Care Physician: [name]: Referring Physician: Treating Physician/Extender: [name] in Treatment: 1 Education Assessment Education Provided To: Patient Education Topics Provided Wound/Skin Impairment: Handouts: Caring for Your Ulcer Methods: Explain/Verbal Responses: Reinforcements needed Electronic Signature(s) Signed: 03/30/2023 4:21:40 PM By: Clinton Stall RN, BSN Entered By: [name] on 03/30/2023 10:06:52 [name] ([id number]) [id number] Page 7 of 8 -------------------------------------------------------------------------------- Wound Assessment Details Patient Name: Date of Service: [name] RD O. 03/30/2023 12:30 PM Medical Record Number: [id number] Patient Account Number: [id number] Date of Birth/Sex: Treating RN: [birth year] (67 y.o. M) [name], [name]  [name], [name] ([id number]) [id number] Page 1 of 8 Visit Report for 03/30/2023 Arrival Information Details Patient Name: Date of Service: [name] RD O. 03/30/2023 12:30 PM Medical Record Number: [id number] Patient Account Number: [id number] Date of Birth/Sex: Treating RN: March 21, 1956 (67 y.o. [name], [name] Primary Care [name]: [name]: Referring [name]: Treating [name]/Extender: [name] in Treatment: 1 Visit Information History Since Last Visit Added or deleted any medications: No Patient Arrived: Ambulatory Any new allergies or adverse reactions: No Arrival Time: 12:57 Had a fall or experienced change in No Accompanied By: Wife activities of daily living that may affect Transfer Assistance: None risk of falls: Patient Identification Verified: Yes Signs or symptoms of abuse/neglect since last visito No Secondary Verification Process Completed: Yes Hospitalized since last visit: No Patient Requires Transmission-Based Precautions: No Implantable device outside of the clinic excluding No Patient Has Alerts: No cellular tissue based products placed in the center since last visit: Has Dressing in Place as Prescribed: Yes Has Compression in Place as Prescribed: Yes Pain Present Now: No Electronic Signature(s) Signed: 03/30/2023 4:21:40 PM By: Clinton Stall RN, BSN Entered By: [name] on 03/30/2023 09:58:08 -------------------------------------------------------------------------------- Clinic Level of Care Assessment Details Patient Name: Date of Service: [facility] RD O. 03/30/2023 12:30 PM Medical Record Number: [id number] Patient Account Number: [id number] Date of Birth/Sex: Treating RN: Dec 04, 1955 (67 y.o. [name] Primary Care Ellory Khurana: [name]: Referring Shriyan Arakawa: Treating Lendora Keys/Extender: [name] in  Treatment: 1 Clinic Level of Care Assessment Items TOOL 4 Quantity Score X- 1 0 Use when only an EandM is performed on FOLLOW-UP visit ASSESSMENTS - Nursing Assessment / Reassessment X- 1 10 Reassessment of Co-morbidities (includes updates in patient status) X- 1 5 Reassessment of Adherence to Treatment Plan ASSESSMENTS - Wound and Skin A ssessment / Reassessment X - Simple Wound Assessment / Reassessment - one wound 1 5 []  - 0 Complex Wound Assessment / Reassessment - multiple wounds X- 1 10 Dermatologic / Skin Assessment (not related to wound area) ASSESSMENTS - Focused Assessment X- 2 5 Circumferential Edema Measurements - multi extremities []  - 0 Nutritional Assessment / Counseling / Intervention [name], [name] ([id number]) [id number] Page 2 of 8 []  - 0 Lower Extremity Assessment (monofilament, tuning fork, pulses) []  - 0 Peripheral Arterial Disease Assessment (using hand held doppler) ASSESSMENTS - Ostomy and/or Continence Assessment and Care []  - 0 Incontinence Assessment and Management []  - 0 Ostomy Care Assessment and Management (repouching, etc.) PROCESS - Coordination of Care []  - 0 Simple Patient / Family Education for ongoing care X- 1 20 Complex (extensive) Patient / Family Education for ongoing care X- 1 10 Staff obtains Chiropractor, Records, T Results / Process Orders est []  - 0 Staff telephones HHA, Nursing Homes / Clarify orders / etc []  - 0 Routine Transfer to another Facility (non-emergent condition) []  - 0 Routine Hospital Admission (non-emergent condition) []  - 0 New Admissions / Manufacturing engineer / Ordering NPWT Apligraf, etc. , []  - 0 Emergency Hospital Admission (emergent condition) []  - 0 Simple Discharge Coordination X- 1 15 Complex (extensive) Discharge Coordination PROCESS - Special Needs []  - 0 Pediatric / Minor Patient Management []  - 0 Isolation Patient Management []  - 0 Hearing / Language /  Visual special needs []  - 0 Assessment of Community assistance (transportation, D/C planning, etc.) []  - 0 Additional assistance / Altered mentation []  - 0 Support Surface(s) Assessment (bed, cushion, seat, etc.) INTERVENTIONS -  [name], [name] ([id number]) [id number] Page 1 of 8 Visit Report for 03/30/2023 Arrival Information Details Patient Name: Date of Service: [name] RD O. 03/30/2023 12:30 PM Medical Record Number: [id number] Patient Account Number: [id number] Date of Birth/Sex: Treating RN: March 21, 1956 (67 y.o. [name], [name] Primary Care [name]: [name]: Referring [name]: Treating [name]/Extender: [name] in Treatment: 1 Visit Information History Since Last Visit Added or deleted any medications: No Patient Arrived: Ambulatory Any new allergies or adverse reactions: No Arrival Time: 12:57 Had a fall or experienced change in No Accompanied By: Wife activities of daily living that may affect Transfer Assistance: None risk of falls: Patient Identification Verified: Yes Signs or symptoms of abuse/neglect since last visito No Secondary Verification Process Completed: Yes Hospitalized since last visit: No Patient Requires Transmission-Based Precautions: No Implantable device outside of the clinic excluding No Patient Has Alerts: No cellular tissue based products placed in the center since last visit: Has Dressing in Place as Prescribed: Yes Has Compression in Place as Prescribed: Yes Pain Present Now: No Electronic Signature(s) Signed: 03/30/2023 4:21:40 PM By: Clinton Stall RN, BSN Entered By: [name] on 03/30/2023 09:58:08 -------------------------------------------------------------------------------- Clinic Level of Care Assessment Details Patient Name: Date of Service: [facility] RD O. 03/30/2023 12:30 PM Medical Record Number: [id number] Patient Account Number: [id number] Date of Birth/Sex: Treating RN: Dec 04, 1955 (67 y.o. [name] Primary Care Ellory Khurana: [name]: Referring Shriyan Arakawa: Treating Lendora Keys/Extender: [name] in  Treatment: 1 Clinic Level of Care Assessment Items TOOL 4 Quantity Score X- 1 0 Use when only an EandM is performed on FOLLOW-UP visit ASSESSMENTS - Nursing Assessment / Reassessment X- 1 10 Reassessment of Co-morbidities (includes updates in patient status) X- 1 5 Reassessment of Adherence to Treatment Plan ASSESSMENTS - Wound and Skin A ssessment / Reassessment X - Simple Wound Assessment / Reassessment - one wound 1 5 []  - 0 Complex Wound Assessment / Reassessment - multiple wounds X- 1 10 Dermatologic / Skin Assessment (not related to wound area) ASSESSMENTS - Focused Assessment X- 2 5 Circumferential Edema Measurements - multi extremities []  - 0 Nutritional Assessment / Counseling / Intervention [name], [name] ([id number]) [id number] Page 2 of 8 []  - 0 Lower Extremity Assessment (monofilament, tuning fork, pulses) []  - 0 Peripheral Arterial Disease Assessment (using hand held doppler) ASSESSMENTS - Ostomy and/or Continence Assessment and Care []  - 0 Incontinence Assessment and Management []  - 0 Ostomy Care Assessment and Management (repouching, etc.) PROCESS - Coordination of Care []  - 0 Simple Patient / Family Education for ongoing care X- 1 20 Complex (extensive) Patient / Family Education for ongoing care X- 1 10 Staff obtains Chiropractor, Records, T Results / Process Orders est []  - 0 Staff telephones HHA, Nursing Homes / Clarify orders / etc []  - 0 Routine Transfer to another Facility (non-emergent condition) []  - 0 Routine Hospital Admission (non-emergent condition) []  - 0 New Admissions / Manufacturing engineer / Ordering NPWT Apligraf, etc. , []  - 0 Emergency Hospital Admission (emergent condition) []  - 0 Simple Discharge Coordination X- 1 15 Complex (extensive) Discharge Coordination PROCESS - Special Needs []  - 0 Pediatric / Minor Patient Management []  - 0 Isolation Patient Management []  - 0 Hearing / Language /  Visual special needs []  - 0 Assessment of Community assistance (transportation, D/C planning, etc.) []  - 0 Additional assistance / Altered mentation []  - 0 Support Surface(s) Assessment (bed, cushion, seat, etc.) INTERVENTIONS -  [name], [name] ([id number]) [id number] Page 1 of 8 Visit Report for 03/30/2023 Arrival Information Details Patient Name: Date of Service: [name] RD O. 03/30/2023 12:30 PM Medical Record Number: [id number] Patient Account Number: [id number] Date of Birth/Sex: Treating RN: March 21, 1956 (67 y.o. [name], [name] Primary Care [name]: [name]: Referring [name]: Treating [name]/Extender: [name] in Treatment: 1 Visit Information History Since Last Visit Added or deleted any medications: No Patient Arrived: Ambulatory Any new allergies or adverse reactions: No Arrival Time: 12:57 Had a fall or experienced change in No Accompanied By: Wife activities of daily living that may affect Transfer Assistance: None risk of falls: Patient Identification Verified: Yes Signs or symptoms of abuse/neglect since last visito No Secondary Verification Process Completed: Yes Hospitalized since last visit: No Patient Requires Transmission-Based Precautions: No Implantable device outside of the clinic excluding No Patient Has Alerts: No cellular tissue based products placed in the center since last visit: Has Dressing in Place as Prescribed: Yes Has Compression in Place as Prescribed: Yes Pain Present Now: No Electronic Signature(s) Signed: 03/30/2023 4:21:40 PM By: Clinton Stall RN, BSN Entered By: [name] on 03/30/2023 09:58:08 -------------------------------------------------------------------------------- Clinic Level of Care Assessment Details Patient Name: Date of Service: [facility] RD O. 03/30/2023 12:30 PM Medical Record Number: [id number] Patient Account Number: [id number] Date of Birth/Sex: Treating RN: Dec 04, 1955 (67 y.o. [name] Primary Care Ellory Khurana: [name]: Referring Shriyan Arakawa: Treating Lendora Keys/Extender: [name] in  Treatment: 1 Clinic Level of Care Assessment Items TOOL 4 Quantity Score X- 1 0 Use when only an EandM is performed on FOLLOW-UP visit ASSESSMENTS - Nursing Assessment / Reassessment X- 1 10 Reassessment of Co-morbidities (includes updates in patient status) X- 1 5 Reassessment of Adherence to Treatment Plan ASSESSMENTS - Wound and Skin A ssessment / Reassessment X - Simple Wound Assessment / Reassessment - one wound 1 5 []  - 0 Complex Wound Assessment / Reassessment - multiple wounds X- 1 10 Dermatologic / Skin Assessment (not related to wound area) ASSESSMENTS - Focused Assessment X- 2 5 Circumferential Edema Measurements - multi extremities []  - 0 Nutritional Assessment / Counseling / Intervention [name], [name] ([id number]) [id number] Page 2 of 8 []  - 0 Lower Extremity Assessment (monofilament, tuning fork, pulses) []  - 0 Peripheral Arterial Disease Assessment (using hand held doppler) ASSESSMENTS - Ostomy and/or Continence Assessment and Care []  - 0 Incontinence Assessment and Management []  - 0 Ostomy Care Assessment and Management (repouching, etc.) PROCESS - Coordination of Care []  - 0 Simple Patient / Family Education for ongoing care X- 1 20 Complex (extensive) Patient / Family Education for ongoing care X- 1 10 Staff obtains Chiropractor, Records, T Results / Process Orders est []  - 0 Staff telephones HHA, Nursing Homes / Clarify orders / etc []  - 0 Routine Transfer to another Facility (non-emergent condition) []  - 0 Routine Hospital Admission (non-emergent condition) []  - 0 New Admissions / Manufacturing engineer / Ordering NPWT Apligraf, etc. , []  - 0 Emergency Hospital Admission (emergent condition) []  - 0 Simple Discharge Coordination X- 1 15 Complex (extensive) Discharge Coordination PROCESS - Special Needs []  - 0 Pediatric / Minor Patient Management []  - 0 Isolation Patient Management []  - 0 Hearing / Language /  Visual special needs []  - 0 Assessment of Community assistance (transportation, D/C planning, etc.) []  - 0 Additional assistance / Altered mentation []  - 0 Support Surface(s) Assessment (bed, cushion, seat, etc.) INTERVENTIONS -

## 2023-03-30 NOTE — Progress Notes (Signed)
[name], [name] ([id number]) [id number] Page 2 of 6 saphenous vein showed reflux in the mid thigh and proximal calf. The patient has a weeping inflamed area on the right anterior lower leg with weeping edema fluid coming out a small wounds. He does not have anything definitively open on the left anterior lower leg although he has evidence of stasis dermatitis and some dry flaking skin. On the right he has been using copious amount of Band-Aids maxipads and changing the dressing frequently. His wife reminds [place] that she thinks that the compression change once a week is insufficient to control his drainage. Both the intake nurse and myself told them that we might be able to work him in for a nurse visit  once a week but we certainly cannot do that more frequently Past medical history is essentially unchanged he has a dilated cardiomyopathy, persistent atrial fibrillation receiving previous cardioversions. He has undergone a recent ablation of his prostate in August of this year. Chronic diastolic heart failure hypertension mild mitral regurgitation. He has chronic kidney disease with the most recent estimate estimated creatinine clearance of 42 and a most recent creatinine of 2.19 in August of this year. His ABI in our clinic was 1.29 9/23; patient arrives in clinic today with the wounds on the right leg healed. He did not have anything open on the left. He has chronic venous insufficiency with severe stasis dermatitis. He brought in his stockings but they are probably support stockings in the 15 to 20 mmHg. We took leg measurements and suggest that he get 30-40 stockings at [facility] in [place]. I note that he has had previous venous reflux studies that showed reflux in the common femoral vein and superficial vein reflux in the saphenofemoral junction and greater saphenous vein on the right. He might actually be a candidate for ablation surgery if he comes back. Everything is closed today Electronic Signature(s) Signed: 03/30/2023 3:56:56 PM By: Baltazar Najjar MD Entered By: Baltazar Najjar on 03/30/2023 10:43:35 -------------------------------------------------------------------------------- Physical Exam Details Patient Name: Date of Service: [name] RD O. 03/30/2023 12:30 PM Medical Record Number: [id number] Patient Account Number: [id number] Date of Birth/Sex: Treating RN: 21-May-1956 (67 y.o. M) Primary Care Provider: Johny Blamer Other Clinician: Referring Provider: Treating Provider/Extender: Robin Searing in Treatment: 1 Constitutional Patient is hypertensive.. Pulse regular and within target range for patient.Marland Kitchen Respirations regular,  non-labored and within target range.. Temperature is normal and within the target range for the patient.Marland Kitchen Appears in no distress. Cardiovascular Pedal pulses are palpable bilaterally. Edema is well-controlled. He has severe venous stasis in the right greater than left leg. Dry fissured skin. Notes Wound exam; he has no open wounds on the right leg versus last week. His edema is controlled. Electronic Signature(s) Signed: 03/30/2023 3:56:56 PM By: Baltazar Najjar MD Entered By: Baltazar Najjar on 03/30/2023 10:44:57 -------------------------------------------------------------------------------- Physician Orders Details Patient Name: Date of Service: [name] RD O. 03/30/2023 12:30 PM Medical Record Number: [id number] Patient Account Number: [id number] Date of Birth/Sex: Treating RN: March 07, 1956 (67 y.o. [name] Primary Care Provider: Johny Blamer Other Clinician: Referring Provider: Treating Provider/Extender: Robin Searing in Treatment: 1 The following information was scribed by: Shawn Stall The information was scribed for: Baltazar Najjar Verbal / Phone Orders: No Clinton, [name] ([id number]) [id number] Page 3 of 6 Diagnosis Coding ICD-10 Coding Code Description I87.331 Chronic venous hypertension (idiopathic) with ulcer and inflammation of right lower extremity I87.322 Chronic venous hypertension (idiopathic)  with inflammation of left lower extremity L97.818 Non-pressure chronic ulcer of other part of right lower leg with other specified severity I50.32 Chronic diastolic (congestive) heart failure Discharge From [facility] Services Discharge from Wound Care Center - May call [facility] or any other medical supply store for stronger degree of compression 30-40mmHg- apply in the morning and remove at night. Lotion both legs with TCA mixed with cetaphtil every night before bed. ****Cancel other  appointments***** Edema Control - Lymphedema / SCD / Other Elevate legs to the level of the heart or above for 30 minutes daily and/or when sitting for 3-4 times a day throughout the day. Avoid standing for long periods of time. Exercise regularly Moisturize legs daily. - both legs every night before bed. Compression stocking or Garment 30-40 mm/Hg pressure to: - both legs- apply in the morning and remove at night. will apply tubigrip size E double layer to both legs, till your new stockings arrive at home. Patient Medications llergies: hydrocodone A Notifications Medication Indication Start End stasis dermatitis 03/30/2023 triamcinolone acetonide DOSE 0 - topical 0.1 % cream - cream topical once daily 1/4 in cetaphil cream Electronic Signature(s) Signed: 03/30/2023 3:56:56 PM By: Baltazar Najjar MD Signed: 03/30/2023 4:21:40 PM By: Shawn Stall RN, BSN Previous Signature: 03/30/2023 1:27:08 PM Version By: Baltazar Najjar MD Entered By: Shawn Stall on 03/30/2023 10:56:09 -------------------------------------------------------------------------------- Problem List Details Patient Name: Date of Service: [name] RD O. 03/30/2023 12:30 PM Medical Record Number: [id number] Patient Account Number: [id number] Date of Birth/Sex: Treating RN: [birth year] (67 y.o. [name] Primary Care Provider: Johny Blamer Other Clinician: Referring Provider: Treating Provider/Extender: Robin Searing in Treatment: 1 Active Problems ICD-10 Encounter Code Description Active Date MDM Diagnosis I87.331 Chronic venous hypertension (idiopathic) with ulcer and inflammation of right 03/23/2023 No Yes lower extremity I87.322 Chronic venous hypertension (idiopathic) with inflammation of left lower 03/23/2023 No Yes extremity L97.818 Non-pressure chronic ulcer of other part of right lower leg with other specified 03/23/2023 No Yes severity [name], [name] ([id number])  [id number] Page 4 of 6 I50.32 Chronic diastolic (congestive) heart failure 03/23/2023 No Yes Inactive Problems Resolved Problems Electronic Signature(s) Signed: 03/30/2023 3:56:56 PM By: Baltazar Najjar MD Entered By: Baltazar Najjar on 03/30/2023 10:40:58 -------------------------------------------------------------------------------- Progress Note Details Patient Name: Date of Service: [name] RD O. 03/30/2023 12:30 PM Medical Record Number: [id number] Patient Account Number: [id number] Date of Birth/Sex: Treating RN: 24-Apr-1956 (67 y.o. M) Primary Care Provider: Johny Blamer Other Clinician: Referring Provider: Treating Provider/Extender: Robin Searing in Treatment: 1 Subjective History of Present Illness (HPI) ADMISSION 02/24/2022 This is a 67 year old man with a past medical history significant for congestive heart failure, morbid obesity, dilated cardiomyopathy, atrial fibrillation, and lower extremity cellulitis. He is currently being treated with Keflex. He is not diabetic. He does not smoke. He says that he developed some blisters on his left anterior tibial surface which subsequently broke open causing the wounds for which she is here to see [place] today. He says that he occasionally wears compression stockings but does not do so on a regular basis. He reports that "they look stupid with shorts." ABI in clinic today was 0.94. On the left anterior tibial surface, there are several small wounds in a geographic pattern. They do appear consistent with blisters that have ruptured. The fat layer is exposed and there is a thin layer of slough on the surfaces. He does have some surrounding erythema, but no purulent drainage. Edema control  late last year I do not think he followed up on this. #5 he can be discharged from the clinic. He is at least moderate risk of further breakdown in the short-term Electronic Signature(s) Signed: 03/30/2023 3:56:56 PM By: Baltazar Najjar MD Signed: 03/30/2023 4:21:40 PM By: Shawn Stall RN, BSN Entered By: Shawn Stall on 03/30/2023 10:56:22 -------------------------------------------------------------------------------- SuperBill Details Patient Name: Date of Service: [name] RD O. 03/30/2023 Medical Record Number: [id number] Patient Account Number: [id number] Date of Birth/Sex: Treating RN: [birth year] (67 y.o. Harlon Flor, Millard.Loa Primary Care Provider: Johny Blamer Other Clinician: Referring Provider: Treating Provider/Extender: Robin Searing in Treatment: 1 Diagnosis Coding ICD-10 Codes Code Description [phone] Chronic venous hypertension (idiopathic) with ulcer and inflammation of right lower extremity I87.322 Chronic venous hypertension (idiopathic) with inflammation of left lower  extremity L97.818 Non-pressure chronic ulcer of other part of right lower leg with other specified severity I50.32 Chronic diastolic (congestive) heart failure Facility Procedures : CPT4 Code: [id number] Description: 99213 - WOUND CARE VISIT-LEV 3 EST PT Modifier: Quantity: 1 Physician Procedures : CPT4 Code Description Modifier [id number] 99213 - WC PHYS LEVEL 3 - EST PT ICD-10 Diagnosis Description I87.331 Chronic venous hypertension (idiopathic) with ulcer and inflammation of right lower extremity I87.322 Chronic venous hypertension (idiopathic)  with inflammation of left lower extremity L97.818 Non-pressure chronic ulcer of other part of right lower leg with other specified severity Quantity: 1 Electronic Signature(s) Signed: 03/30/2023 3:56:56 PM By: Baltazar Najjar MD Entered By: Baltazar Najjar on 03/30/2023 10:47:22  with inflammation of left lower extremity L97.818 Non-pressure chronic ulcer of other part of right lower leg with other specified severity I50.32 Chronic diastolic (congestive) heart failure Discharge From [facility] Services Discharge from Wound Care Center - May call [facility] or any other medical supply store for stronger degree of compression 30-40mmHg- apply in the morning and remove at night. Lotion both legs with TCA mixed with cetaphtil every night before bed. ****Cancel other  appointments***** Edema Control - Lymphedema / SCD / Other Elevate legs to the level of the heart or above for 30 minutes daily and/or when sitting for 3-4 times a day throughout the day. Avoid standing for long periods of time. Exercise regularly Moisturize legs daily. - both legs every night before bed. Compression stocking or Garment 30-40 mm/Hg pressure to: - both legs- apply in the morning and remove at night. will apply tubigrip size E double layer to both legs, till your new stockings arrive at home. Patient Medications llergies: hydrocodone A Notifications Medication Indication Start End stasis dermatitis 03/30/2023 triamcinolone acetonide DOSE 0 - topical 0.1 % cream - cream topical once daily 1/4 in cetaphil cream Electronic Signature(s) Signed: 03/30/2023 3:56:56 PM By: Baltazar Najjar MD Signed: 03/30/2023 4:21:40 PM By: Shawn Stall RN, BSN Previous Signature: 03/30/2023 1:27:08 PM Version By: Baltazar Najjar MD Entered By: Shawn Stall on 03/30/2023 10:56:09 -------------------------------------------------------------------------------- Problem List Details Patient Name: Date of Service: [name] RD O. 03/30/2023 12:30 PM Medical Record Number: [id number] Patient Account Number: [id number] Date of Birth/Sex: Treating RN: [birth year] (67 y.o. [name] Primary Care Provider: Johny Blamer Other Clinician: Referring Provider: Treating Provider/Extender: Robin Searing in Treatment: 1 Active Problems ICD-10 Encounter Code Description Active Date MDM Diagnosis I87.331 Chronic venous hypertension (idiopathic) with ulcer and inflammation of right 03/23/2023 No Yes lower extremity I87.322 Chronic venous hypertension (idiopathic) with inflammation of left lower 03/23/2023 No Yes extremity L97.818 Non-pressure chronic ulcer of other part of right lower leg with other specified 03/23/2023 No Yes severity [name], [name] ([id number])  [id number] Page 4 of 6 I50.32 Chronic diastolic (congestive) heart failure 03/23/2023 No Yes Inactive Problems Resolved Problems Electronic Signature(s) Signed: 03/30/2023 3:56:56 PM By: Baltazar Najjar MD Entered By: Baltazar Najjar on 03/30/2023 10:40:58 -------------------------------------------------------------------------------- Progress Note Details Patient Name: Date of Service: [name] RD O. 03/30/2023 12:30 PM Medical Record Number: [id number] Patient Account Number: [id number] Date of Birth/Sex: Treating RN: 24-Apr-1956 (67 y.o. M) Primary Care Provider: Johny Blamer Other Clinician: Referring Provider: Treating Provider/Extender: Robin Searing in Treatment: 1 Subjective History of Present Illness (HPI) ADMISSION 02/24/2022 This is a 67 year old man with a past medical history significant for congestive heart failure, morbid obesity, dilated cardiomyopathy, atrial fibrillation, and lower extremity cellulitis. He is currently being treated with Keflex. He is not diabetic. He does not smoke. He says that he developed some blisters on his left anterior tibial surface which subsequently broke open causing the wounds for which she is here to see [place] today. He says that he occasionally wears compression stockings but does not do so on a regular basis. He reports that "they look stupid with shorts." ABI in clinic today was 0.94. On the left anterior tibial surface, there are several small wounds in a geographic pattern. They do appear consistent with blisters that have ruptured. The fat layer is exposed and there is a thin layer of slough on the surfaces. He does have some surrounding erythema, but no purulent drainage. Edema control  with inflammation of left lower extremity L97.818 Non-pressure chronic ulcer of other part of right lower leg with other specified severity I50.32 Chronic diastolic (congestive) heart failure Discharge From [facility] Services Discharge from Wound Care Center - May call [facility] or any other medical supply store for stronger degree of compression 30-40mmHg- apply in the morning and remove at night. Lotion both legs with TCA mixed with cetaphtil every night before bed. ****Cancel other  appointments***** Edema Control - Lymphedema / SCD / Other Elevate legs to the level of the heart or above for 30 minutes daily and/or when sitting for 3-4 times a day throughout the day. Avoid standing for long periods of time. Exercise regularly Moisturize legs daily. - both legs every night before bed. Compression stocking or Garment 30-40 mm/Hg pressure to: - both legs- apply in the morning and remove at night. will apply tubigrip size E double layer to both legs, till your new stockings arrive at home. Patient Medications llergies: hydrocodone A Notifications Medication Indication Start End stasis dermatitis 03/30/2023 triamcinolone acetonide DOSE 0 - topical 0.1 % cream - cream topical once daily 1/4 in cetaphil cream Electronic Signature(s) Signed: 03/30/2023 3:56:56 PM By: Baltazar Najjar MD Signed: 03/30/2023 4:21:40 PM By: Shawn Stall RN, BSN Previous Signature: 03/30/2023 1:27:08 PM Version By: Baltazar Najjar MD Entered By: Shawn Stall on 03/30/2023 10:56:09 -------------------------------------------------------------------------------- Problem List Details Patient Name: Date of Service: [name] RD O. 03/30/2023 12:30 PM Medical Record Number: [id number] Patient Account Number: [id number] Date of Birth/Sex: Treating RN: [birth year] (67 y.o. [name] Primary Care Provider: Johny Blamer Other Clinician: Referring Provider: Treating Provider/Extender: Robin Searing in Treatment: 1 Active Problems ICD-10 Encounter Code Description Active Date MDM Diagnosis I87.331 Chronic venous hypertension (idiopathic) with ulcer and inflammation of right 03/23/2023 No Yes lower extremity I87.322 Chronic venous hypertension (idiopathic) with inflammation of left lower 03/23/2023 No Yes extremity L97.818 Non-pressure chronic ulcer of other part of right lower leg with other specified 03/23/2023 No Yes severity [name], [name] ([id number])  [id number] Page 4 of 6 I50.32 Chronic diastolic (congestive) heart failure 03/23/2023 No Yes Inactive Problems Resolved Problems Electronic Signature(s) Signed: 03/30/2023 3:56:56 PM By: Baltazar Najjar MD Entered By: Baltazar Najjar on 03/30/2023 10:40:58 -------------------------------------------------------------------------------- Progress Note Details Patient Name: Date of Service: [name] RD O. 03/30/2023 12:30 PM Medical Record Number: [id number] Patient Account Number: [id number] Date of Birth/Sex: Treating RN: 24-Apr-1956 (67 y.o. M) Primary Care Provider: Johny Blamer Other Clinician: Referring Provider: Treating Provider/Extender: Robin Searing in Treatment: 1 Subjective History of Present Illness (HPI) ADMISSION 02/24/2022 This is a 67 year old man with a past medical history significant for congestive heart failure, morbid obesity, dilated cardiomyopathy, atrial fibrillation, and lower extremity cellulitis. He is currently being treated with Keflex. He is not diabetic. He does not smoke. He says that he developed some blisters on his left anterior tibial surface which subsequently broke open causing the wounds for which she is here to see [place] today. He says that he occasionally wears compression stockings but does not do so on a regular basis. He reports that "they look stupid with shorts." ABI in clinic today was 0.94. On the left anterior tibial surface, there are several small wounds in a geographic pattern. They do appear consistent with blisters that have ruptured. The fat layer is exposed and there is a thin layer of slough on the surfaces. He does have some surrounding erythema, but no purulent drainage. Edema control  late last year I do not think he followed up on this. #5 he can be discharged from the clinic. He is at least moderate risk of further breakdown in the short-term Electronic Signature(s) Signed: 03/30/2023 3:56:56 PM By: Baltazar Najjar MD Signed: 03/30/2023 4:21:40 PM By: Shawn Stall RN, BSN Entered By: Shawn Stall on 03/30/2023 10:56:22 -------------------------------------------------------------------------------- SuperBill Details Patient Name: Date of Service: [name] RD O. 03/30/2023 Medical Record Number: [id number] Patient Account Number: [id number] Date of Birth/Sex: Treating RN: [birth year] (67 y.o. Harlon Flor, Millard.Loa Primary Care Provider: Johny Blamer Other Clinician: Referring Provider: Treating Provider/Extender: Robin Searing in Treatment: 1 Diagnosis Coding ICD-10 Codes Code Description [phone] Chronic venous hypertension (idiopathic) with ulcer and inflammation of right lower extremity I87.322 Chronic venous hypertension (idiopathic) with inflammation of left lower  extremity L97.818 Non-pressure chronic ulcer of other part of right lower leg with other specified severity I50.32 Chronic diastolic (congestive) heart failure Facility Procedures : CPT4 Code: [id number] Description: 99213 - WOUND CARE VISIT-LEV 3 EST PT Modifier: Quantity: 1 Physician Procedures : CPT4 Code Description Modifier [id number] 99213 - WC PHYS LEVEL 3 - EST PT ICD-10 Diagnosis Description I87.331 Chronic venous hypertension (idiopathic) with ulcer and inflammation of right lower extremity I87.322 Chronic venous hypertension (idiopathic)  with inflammation of left lower extremity L97.818 Non-pressure chronic ulcer of other part of right lower leg with other specified severity Quantity: 1 Electronic Signature(s) Signed: 03/30/2023 3:56:56 PM By: Baltazar Najjar MD Entered By: Baltazar Najjar on 03/30/2023 10:47:22  with inflammation of left lower extremity L97.818 Non-pressure chronic ulcer of other part of right lower leg with other specified severity I50.32 Chronic diastolic (congestive) heart failure Discharge From [facility] Services Discharge from Wound Care Center - May call [facility] or any other medical supply store for stronger degree of compression 30-40mmHg- apply in the morning and remove at night. Lotion both legs with TCA mixed with cetaphtil every night before bed. ****Cancel other  appointments***** Edema Control - Lymphedema / SCD / Other Elevate legs to the level of the heart or above for 30 minutes daily and/or when sitting for 3-4 times a day throughout the day. Avoid standing for long periods of time. Exercise regularly Moisturize legs daily. - both legs every night before bed. Compression stocking or Garment 30-40 mm/Hg pressure to: - both legs- apply in the morning and remove at night. will apply tubigrip size E double layer to both legs, till your new stockings arrive at home. Patient Medications llergies: hydrocodone A Notifications Medication Indication Start End stasis dermatitis 03/30/2023 triamcinolone acetonide DOSE 0 - topical 0.1 % cream - cream topical once daily 1/4 in cetaphil cream Electronic Signature(s) Signed: 03/30/2023 3:56:56 PM By: Baltazar Najjar MD Signed: 03/30/2023 4:21:40 PM By: Shawn Stall RN, BSN Previous Signature: 03/30/2023 1:27:08 PM Version By: Baltazar Najjar MD Entered By: Shawn Stall on 03/30/2023 10:56:09 -------------------------------------------------------------------------------- Problem List Details Patient Name: Date of Service: [name] RD O. 03/30/2023 12:30 PM Medical Record Number: [id number] Patient Account Number: [id number] Date of Birth/Sex: Treating RN: [birth year] (67 y.o. [name] Primary Care Provider: Johny Blamer Other Clinician: Referring Provider: Treating Provider/Extender: Robin Searing in Treatment: 1 Active Problems ICD-10 Encounter Code Description Active Date MDM Diagnosis I87.331 Chronic venous hypertension (idiopathic) with ulcer and inflammation of right 03/23/2023 No Yes lower extremity I87.322 Chronic venous hypertension (idiopathic) with inflammation of left lower 03/23/2023 No Yes extremity L97.818 Non-pressure chronic ulcer of other part of right lower leg with other specified 03/23/2023 No Yes severity [name], [name] ([id number])  [id number] Page 4 of 6 I50.32 Chronic diastolic (congestive) heart failure 03/23/2023 No Yes Inactive Problems Resolved Problems Electronic Signature(s) Signed: 03/30/2023 3:56:56 PM By: Baltazar Najjar MD Entered By: Baltazar Najjar on 03/30/2023 10:40:58 -------------------------------------------------------------------------------- Progress Note Details Patient Name: Date of Service: [name] RD O. 03/30/2023 12:30 PM Medical Record Number: [id number] Patient Account Number: [id number] Date of Birth/Sex: Treating RN: 24-Apr-1956 (67 y.o. M) Primary Care Provider: Johny Blamer Other Clinician: Referring Provider: Treating Provider/Extender: Robin Searing in Treatment: 1 Subjective History of Present Illness (HPI) ADMISSION 02/24/2022 This is a 67 year old man with a past medical history significant for congestive heart failure, morbid obesity, dilated cardiomyopathy, atrial fibrillation, and lower extremity cellulitis. He is currently being treated with Keflex. He is not diabetic. He does not smoke. He says that he developed some blisters on his left anterior tibial surface which subsequently broke open causing the wounds for which she is here to see [place] today. He says that he occasionally wears compression stockings but does not do so on a regular basis. He reports that "they look stupid with shorts." ABI in clinic today was 0.94. On the left anterior tibial surface, there are several small wounds in a geographic pattern. They do appear consistent with blisters that have ruptured. The fat layer is exposed and there is a thin layer of slough on the surfaces. He does have some surrounding erythema, but no purulent drainage. Edema control  [name], [name] ([id number]) [id number] Page 2 of 6 saphenous vein showed reflux in the mid thigh and proximal calf. The patient has a weeping inflamed area on the right anterior lower leg with weeping edema fluid coming out a small wounds. He does not have anything definitively open on the left anterior lower leg although he has evidence of stasis dermatitis and some dry flaking skin. On the right he has been using copious amount of Band-Aids maxipads and changing the dressing frequently. His wife reminds [place] that she thinks that the compression change once a week is insufficient to control his drainage. Both the intake nurse and myself told them that we might be able to work him in for a nurse visit  once a week but we certainly cannot do that more frequently Past medical history is essentially unchanged he has a dilated cardiomyopathy, persistent atrial fibrillation receiving previous cardioversions. He has undergone a recent ablation of his prostate in August of this year. Chronic diastolic heart failure hypertension mild mitral regurgitation. He has chronic kidney disease with the most recent estimate estimated creatinine clearance of 42 and a most recent creatinine of 2.19 in August of this year. His ABI in our clinic was 1.29 9/23; patient arrives in clinic today with the wounds on the right leg healed. He did not have anything open on the left. He has chronic venous insufficiency with severe stasis dermatitis. He brought in his stockings but they are probably support stockings in the 15 to 20 mmHg. We took leg measurements and suggest that he get 30-40 stockings at [facility] in [place]. I note that he has had previous venous reflux studies that showed reflux in the common femoral vein and superficial vein reflux in the saphenofemoral junction and greater saphenous vein on the right. He might actually be a candidate for ablation surgery if he comes back. Everything is closed today Electronic Signature(s) Signed: 03/30/2023 3:56:56 PM By: Baltazar Najjar MD Entered By: Baltazar Najjar on 03/30/2023 10:43:35 -------------------------------------------------------------------------------- Physical Exam Details Patient Name: Date of Service: [name] RD O. 03/30/2023 12:30 PM Medical Record Number: [id number] Patient Account Number: [id number] Date of Birth/Sex: Treating RN: 21-May-1956 (67 y.o. M) Primary Care Provider: Johny Blamer Other Clinician: Referring Provider: Treating Provider/Extender: Robin Searing in Treatment: 1 Constitutional Patient is hypertensive.. Pulse regular and within target range for patient.Marland Kitchen Respirations regular,  non-labored and within target range.. Temperature is normal and within the target range for the patient.Marland Kitchen Appears in no distress. Cardiovascular Pedal pulses are palpable bilaterally. Edema is well-controlled. He has severe venous stasis in the right greater than left leg. Dry fissured skin. Notes Wound exam; he has no open wounds on the right leg versus last week. His edema is controlled. Electronic Signature(s) Signed: 03/30/2023 3:56:56 PM By: Baltazar Najjar MD Entered By: Baltazar Najjar on 03/30/2023 10:44:57 -------------------------------------------------------------------------------- Physician Orders Details Patient Name: Date of Service: [name] RD O. 03/30/2023 12:30 PM Medical Record Number: [id number] Patient Account Number: [id number] Date of Birth/Sex: Treating RN: March 07, 1956 (67 y.o. [name] Primary Care Provider: Johny Blamer Other Clinician: Referring Provider: Treating Provider/Extender: Robin Searing in Treatment: 1 The following information was scribed by: Shawn Stall The information was scribed for: Baltazar Najjar Verbal / Phone Orders: No Clinton, [name] ([id number]) [id number] Page 3 of 6 Diagnosis Coding ICD-10 Coding Code Description I87.331 Chronic venous hypertension (idiopathic) with ulcer and inflammation of right lower extremity I87.322 Chronic venous hypertension (idiopathic)

## 2023-04-06 ENCOUNTER — Ambulatory Visit (HOSPITAL_BASED_OUTPATIENT_CLINIC_OR_DEPARTMENT_OTHER): Payer: Medicare Other | Admitting: Internal Medicine

## 2023-04-22 NOTE — Progress Notes (Unsigned)
Allergen Reactions   Baclofen Other (See Comments)    Severe dizziness   Doxycycline Hyclate Other (See Comments)    Flu-like symptoms   Gabapentin Swelling and Other (See Comments)    Swelling in feet   Hycodan [Hydrocodone Bit-Homatrop Mbr] Other (See Comments)    Dizziness    Hydrocodone-Acetaminophen Other (See Comments)    GI upset, Dizziness   Pregabalin Swelling and Other (See Comments)    Severly dry mouth     Labs/Other Studies Reviewed    The following studies were reviewed today:  Cardiac Studies & Procedures        ECHOCARDIOGRAM  ECHOCARDIOGRAM COMPLETE 04/25/2022  Narrative ECHOCARDIOGRAM REPORT    Patient Name:   [name] Date of Exam: 04/25/2022 Medical Rec #:  [id number]         Height:       76.0 in Accession #:    [id number]        Weight:       350.1 lb Date of Birth:  Mar 14, 1956        BSA:          2.810 m Patient Age:    67 years          BP:           169/102 mmHg Patient Gender: M                 HR:           90 bpm. Exam Location:  Inpatient  Procedure: 2D Echo, Color Doppler and Cardiac Doppler  Indications:    CHF- Acute Diastolic  History:        Patient has prior history of Echocardiogram examinations, most recent 04/02/2021. Arrythmias:Atrial Fibrillation; Risk Factors:Hypertension and Dyslipidemia.  Referring Phys: [id number] CAROLE N HALL  IMPRESSIONS   1. Left ventricular ejection fraction, by estimation, is 60 to 65%. The left ventricle has normal function. The left ventricle has no regional wall motion abnormalities. There is mild left ventricular hypertrophy. Left ventricular diastolic parameters are indeterminate. 2. Right ventricular systolic function is normal. The right ventricular size is normal. 3. Left atrial size was mildly dilated. 4. Mild mitral valve regurgitation. 5. The aortic valve is normal in structure. Aortic valve regurgitation is not visualized.  Comparison(s): The left ventricular function is unchanged compared to echo from September 2022.Marland Kitchen  FINDINGS Left Ventricle: Left ventricular ejection fraction, by estimation, is 60 to 65%. The left ventricle has normal function. The left ventricle has no regional wall motion abnormalities. The left ventricular internal cavity size was normal in size. There is mild left ventricular hypertrophy. Left ventricular diastolic parameters are indeterminate.  Right Ventricle: The right ventricular size is normal. Right vetricular wall thickness was not assessed. Right ventricular systolic function is  normal.  Left Atrium: Left atrial size was mildly dilated.  Right Atrium: Right atrial size was normal in size.  Pericardium: Trivial pericardial effusion is present.  Mitral Valve: There is mild thickening of the mitral valve leaflet(s). Mild mitral valve regurgitation.  Tricuspid Valve: The tricuspid valve is normal in structure. Tricuspid valve regurgitation is trivial.  Aortic Valve: The aortic valve is normal in structure. Aortic valve regurgitation is not visualized. Aortic valve mean gradient measures 4.0 mmHg. Aortic valve peak gradient measures 7.3 mmHg. Aortic valve area, by VTI measures 2.93 cm.  Pulmonic Valve: The pulmonic valve was normal in structure. Pulmonic valve regurgitation is not visualized.  Aorta: The aortic root and ascending aorta  are structurally normal, with no evidence of dilitation.  IAS/Shunts: The interatrial septum was not assessed.   LEFT VENTRICLE PLAX 2D LVIDd:         5.40 cm LVIDs:         3.70 cm LV PW:         1.20 cm LV IVS:        1.30 cm LVOT diam:     2.30 cm LV SV:         72 LV SV Index:   26 LVOT Area:     4.15 cm   RIGHT VENTRICLE TAPSE (M-mode): 1.8 cm  LEFT ATRIUM              Index        RIGHT ATRIUM           Index LA diam:        6.00 cm  2.13 cm/m   RA Area:     18.60 cm LA Vol (A2C):   124.5 ml 44.30 ml/m  RA Volume:   45.70 ml  16.26 ml/m LA Vol (A4C):   109.0 ml 38.79 ml/m LA Biplane Vol: 106.0 ml 37.72 ml/m AORTIC VALVE AV Area (Vmax):    3.23 cm AV Area (Vmean):   3.03 cm AV Area (VTI):     2.93 cm AV Vmax:           135.00 cm/s AV Vmean:          95.600 cm/s AV VTI:            0.247 m AV Peak Grad:      7.3 mmHg AV Mean Grad:      4.0 mmHg LVOT Vmax:         105.00 cm/s LVOT Vmean:        69.700 cm/s LVOT VTI:          0.174 m LVOT/AV VTI ratio: 0.70  AORTA Ao Root diam: 3.40 cm Ao Asc diam:  3.80 cm  MR Peak grad: 76.2 mmHg MR Vmax:      436.50 cm/s SHUNTS Systemic VTI:  0.17  m Systemic Diam: 2.30 cm  Dietrich Pates MD Electronically signed by Dietrich Pates MD Signature Date/Time: 04/25/2022/10:56:14 AM    Final   TEE  ECHO TEE 03/04/2019  Narrative TRANSESOPHOGEAL ECHO REPORT    Patient Name:   [name] Date of Exam: 03/04/2019 Medical Rec #:  [id number]         Height:       77.0 in Accession #:    [id number]        Weight:       329.0 lb Date of Birth:  [birth year]        BSA:          2.76 m Patient Age:    62 years          BP:           144/101 mmHg Patient Gender: M                 HR:           123 bpm. Exam Location:  Inpatient   Procedure: Transesophageal Echo, Cardiac Doppler and Color Doppler  Indications:     Afib  History:         Patient has prior history of Echocardiogram examinations, most recent 03/31/2017. Atrial Fibrillation Risk Factors: Hypertension, Obesity and Sleep Apnea.  Sonographer:  are structurally normal, with no evidence of dilitation.  IAS/Shunts: The interatrial septum was not assessed.   LEFT VENTRICLE PLAX 2D LVIDd:         5.40 cm LVIDs:         3.70 cm LV PW:         1.20 cm LV IVS:        1.30 cm LVOT diam:     2.30 cm LV SV:         72 LV SV Index:   26 LVOT Area:     4.15 cm   RIGHT VENTRICLE TAPSE (M-mode): 1.8 cm  LEFT ATRIUM              Index        RIGHT ATRIUM           Index LA diam:        6.00 cm  2.13 cm/m   RA Area:     18.60 cm LA Vol (A2C):   124.5 ml 44.30 ml/m  RA Volume:   45.70 ml  16.26 ml/m LA Vol (A4C):   109.0 ml 38.79 ml/m LA Biplane Vol: 106.0 ml 37.72 ml/m AORTIC VALVE AV Area (Vmax):    3.23 cm AV Area (Vmean):   3.03 cm AV Area (VTI):     2.93 cm AV Vmax:           135.00 cm/s AV Vmean:          95.600 cm/s AV VTI:            0.247 m AV Peak Grad:      7.3 mmHg AV Mean Grad:      4.0 mmHg LVOT Vmax:         105.00 cm/s LVOT Vmean:        69.700 cm/s LVOT VTI:          0.174 m LVOT/AV VTI ratio: 0.70  AORTA Ao Root diam: 3.40 cm Ao Asc diam:  3.80 cm  MR Peak grad: 76.2 mmHg MR Vmax:      436.50 cm/s SHUNTS Systemic VTI:  0.17  m Systemic Diam: 2.30 cm  Dietrich Pates MD Electronically signed by Dietrich Pates MD Signature Date/Time: 04/25/2022/10:56:14 AM    Final   TEE  ECHO TEE 03/04/2019  Narrative TRANSESOPHOGEAL ECHO REPORT    Patient Name:   [name] Date of Exam: 03/04/2019 Medical Rec #:  [id number]         Height:       77.0 in Accession #:    [id number]        Weight:       329.0 lb Date of Birth:  [birth year]        BSA:          2.76 m Patient Age:    62 years          BP:           144/101 mmHg Patient Gender: M                 HR:           123 bpm. Exam Location:  Inpatient   Procedure: Transesophageal Echo, Cardiac Doppler and Color Doppler  Indications:     Afib  History:         Patient has prior history of Echocardiogram examinations, most recent 03/31/2017. Atrial Fibrillation Risk Factors: Hypertension, Obesity and Sleep Apnea.  Sonographer:  Allergen Reactions   Baclofen Other (See Comments)    Severe dizziness   Doxycycline Hyclate Other (See Comments)    Flu-like symptoms   Gabapentin Swelling and Other (See Comments)    Swelling in feet   Hycodan [Hydrocodone Bit-Homatrop Mbr] Other (See Comments)    Dizziness    Hydrocodone-Acetaminophen Other (See Comments)    GI upset, Dizziness   Pregabalin Swelling and Other (See Comments)    Severly dry mouth     Labs/Other Studies Reviewed    The following studies were reviewed today:  Cardiac Studies & Procedures        ECHOCARDIOGRAM  ECHOCARDIOGRAM COMPLETE 04/25/2022  Narrative ECHOCARDIOGRAM REPORT    Patient Name:   [name] Date of Exam: 04/25/2022 Medical Rec #:  [id number]         Height:       76.0 in Accession #:    [id number]        Weight:       350.1 lb Date of Birth:  Mar 14, 1956        BSA:          2.810 m Patient Age:    67 years          BP:           169/102 mmHg Patient Gender: M                 HR:           90 bpm. Exam Location:  Inpatient  Procedure: 2D Echo, Color Doppler and Cardiac Doppler  Indications:    CHF- Acute Diastolic  History:        Patient has prior history of Echocardiogram examinations, most recent 04/02/2021. Arrythmias:Atrial Fibrillation; Risk Factors:Hypertension and Dyslipidemia.  Referring Phys: [id number] CAROLE N HALL  IMPRESSIONS   1. Left ventricular ejection fraction, by estimation, is 60 to 65%. The left ventricle has normal function. The left ventricle has no regional wall motion abnormalities. There is mild left ventricular hypertrophy. Left ventricular diastolic parameters are indeterminate. 2. Right ventricular systolic function is normal. The right ventricular size is normal. 3. Left atrial size was mildly dilated. 4. Mild mitral valve regurgitation. 5. The aortic valve is normal in structure. Aortic valve regurgitation is not visualized.  Comparison(s): The left ventricular function is unchanged compared to echo from September 2022.Marland Kitchen  FINDINGS Left Ventricle: Left ventricular ejection fraction, by estimation, is 60 to 65%. The left ventricle has normal function. The left ventricle has no regional wall motion abnormalities. The left ventricular internal cavity size was normal in size. There is mild left ventricular hypertrophy. Left ventricular diastolic parameters are indeterminate.  Right Ventricle: The right ventricular size is normal. Right vetricular wall thickness was not assessed. Right ventricular systolic function is  normal.  Left Atrium: Left atrial size was mildly dilated.  Right Atrium: Right atrial size was normal in size.  Pericardium: Trivial pericardial effusion is present.  Mitral Valve: There is mild thickening of the mitral valve leaflet(s). Mild mitral valve regurgitation.  Tricuspid Valve: The tricuspid valve is normal in structure. Tricuspid valve regurgitation is trivial.  Aortic Valve: The aortic valve is normal in structure. Aortic valve regurgitation is not visualized. Aortic valve mean gradient measures 4.0 mmHg. Aortic valve peak gradient measures 7.3 mmHg. Aortic valve area, by VTI measures 2.93 cm.  Pulmonic Valve: The pulmonic valve was normal in structure. Pulmonic valve regurgitation is not visualized.  Aorta: The aortic root and ascending aorta  Allergen Reactions   Baclofen Other (See Comments)    Severe dizziness   Doxycycline Hyclate Other (See Comments)    Flu-like symptoms   Gabapentin Swelling and Other (See Comments)    Swelling in feet   Hycodan [Hydrocodone Bit-Homatrop Mbr] Other (See Comments)    Dizziness    Hydrocodone-Acetaminophen Other (See Comments)    GI upset, Dizziness   Pregabalin Swelling and Other (See Comments)    Severly dry mouth     Labs/Other Studies Reviewed    The following studies were reviewed today:  Cardiac Studies & Procedures        ECHOCARDIOGRAM  ECHOCARDIOGRAM COMPLETE 04/25/2022  Narrative ECHOCARDIOGRAM REPORT    Patient Name:   [name] Date of Exam: 04/25/2022 Medical Rec #:  [id number]         Height:       76.0 in Accession #:    [id number]        Weight:       350.1 lb Date of Birth:  Mar 14, 1956        BSA:          2.810 m Patient Age:    67 years          BP:           169/102 mmHg Patient Gender: M                 HR:           90 bpm. Exam Location:  Inpatient  Procedure: 2D Echo, Color Doppler and Cardiac Doppler  Indications:    CHF- Acute Diastolic  History:        Patient has prior history of Echocardiogram examinations, most recent 04/02/2021. Arrythmias:Atrial Fibrillation; Risk Factors:Hypertension and Dyslipidemia.  Referring Phys: [id number] CAROLE N HALL  IMPRESSIONS   1. Left ventricular ejection fraction, by estimation, is 60 to 65%. The left ventricle has normal function. The left ventricle has no regional wall motion abnormalities. There is mild left ventricular hypertrophy. Left ventricular diastolic parameters are indeterminate. 2. Right ventricular systolic function is normal. The right ventricular size is normal. 3. Left atrial size was mildly dilated. 4. Mild mitral valve regurgitation. 5. The aortic valve is normal in structure. Aortic valve regurgitation is not visualized.  Comparison(s): The left ventricular function is unchanged compared to echo from September 2022.Marland Kitchen  FINDINGS Left Ventricle: Left ventricular ejection fraction, by estimation, is 60 to 65%. The left ventricle has normal function. The left ventricle has no regional wall motion abnormalities. The left ventricular internal cavity size was normal in size. There is mild left ventricular hypertrophy. Left ventricular diastolic parameters are indeterminate.  Right Ventricle: The right ventricular size is normal. Right vetricular wall thickness was not assessed. Right ventricular systolic function is  normal.  Left Atrium: Left atrial size was mildly dilated.  Right Atrium: Right atrial size was normal in size.  Pericardium: Trivial pericardial effusion is present.  Mitral Valve: There is mild thickening of the mitral valve leaflet(s). Mild mitral valve regurgitation.  Tricuspid Valve: The tricuspid valve is normal in structure. Tricuspid valve regurgitation is trivial.  Aortic Valve: The aortic valve is normal in structure. Aortic valve regurgitation is not visualized. Aortic valve mean gradient measures 4.0 mmHg. Aortic valve peak gradient measures 7.3 mmHg. Aortic valve area, by VTI measures 2.93 cm.  Pulmonic Valve: The pulmonic valve was normal in structure. Pulmonic valve regurgitation is not visualized.  Aorta: The aortic root and ascending aorta  are structurally normal, with no evidence of dilitation.  IAS/Shunts: The interatrial septum was not assessed.   LEFT VENTRICLE PLAX 2D LVIDd:         5.40 cm LVIDs:         3.70 cm LV PW:         1.20 cm LV IVS:        1.30 cm LVOT diam:     2.30 cm LV SV:         72 LV SV Index:   26 LVOT Area:     4.15 cm   RIGHT VENTRICLE TAPSE (M-mode): 1.8 cm  LEFT ATRIUM              Index        RIGHT ATRIUM           Index LA diam:        6.00 cm  2.13 cm/m   RA Area:     18.60 cm LA Vol (A2C):   124.5 ml 44.30 ml/m  RA Volume:   45.70 ml  16.26 ml/m LA Vol (A4C):   109.0 ml 38.79 ml/m LA Biplane Vol: 106.0 ml 37.72 ml/m AORTIC VALVE AV Area (Vmax):    3.23 cm AV Area (Vmean):   3.03 cm AV Area (VTI):     2.93 cm AV Vmax:           135.00 cm/s AV Vmean:          95.600 cm/s AV VTI:            0.247 m AV Peak Grad:      7.3 mmHg AV Mean Grad:      4.0 mmHg LVOT Vmax:         105.00 cm/s LVOT Vmean:        69.700 cm/s LVOT VTI:          0.174 m LVOT/AV VTI ratio: 0.70  AORTA Ao Root diam: 3.40 cm Ao Asc diam:  3.80 cm  MR Peak grad: 76.2 mmHg MR Vmax:      436.50 cm/s SHUNTS Systemic VTI:  0.17  m Systemic Diam: 2.30 cm  Dietrich Pates MD Electronically signed by Dietrich Pates MD Signature Date/Time: 04/25/2022/10:56:14 AM    Final   TEE  ECHO TEE 03/04/2019  Narrative TRANSESOPHOGEAL ECHO REPORT    Patient Name:   [name] Date of Exam: 03/04/2019 Medical Rec #:  [id number]         Height:       77.0 in Accession #:    [id number]        Weight:       329.0 lb Date of Birth:  [birth year]        BSA:          2.76 m Patient Age:    62 years          BP:           144/101 mmHg Patient Gender: M                 HR:           123 bpm. Exam Location:  Inpatient   Procedure: Transesophageal Echo, Cardiac Doppler and Color Doppler  Indications:     Afib  History:         Patient has prior history of Echocardiogram examinations, most recent 03/31/2017. Atrial Fibrillation Risk Factors: Hypertension, Obesity and Sleep Apnea.  Sonographer:  Allergen Reactions   Baclofen Other (See Comments)    Severe dizziness   Doxycycline Hyclate Other (See Comments)    Flu-like symptoms   Gabapentin Swelling and Other (See Comments)    Swelling in feet   Hycodan [Hydrocodone Bit-Homatrop Mbr] Other (See Comments)    Dizziness    Hydrocodone-Acetaminophen Other (See Comments)    GI upset, Dizziness   Pregabalin Swelling and Other (See Comments)    Severly dry mouth     Labs/Other Studies Reviewed    The following studies were reviewed today:  Cardiac Studies & Procedures        ECHOCARDIOGRAM  ECHOCARDIOGRAM COMPLETE 04/25/2022  Narrative ECHOCARDIOGRAM REPORT    Patient Name:   [name] Date of Exam: 04/25/2022 Medical Rec #:  [id number]         Height:       76.0 in Accession #:    [id number]        Weight:       350.1 lb Date of Birth:  Mar 14, 1956        BSA:          2.810 m Patient Age:    67 years          BP:           169/102 mmHg Patient Gender: M                 HR:           90 bpm. Exam Location:  Inpatient  Procedure: 2D Echo, Color Doppler and Cardiac Doppler  Indications:    CHF- Acute Diastolic  History:        Patient has prior history of Echocardiogram examinations, most recent 04/02/2021. Arrythmias:Atrial Fibrillation; Risk Factors:Hypertension and Dyslipidemia.  Referring Phys: [id number] CAROLE N HALL  IMPRESSIONS   1. Left ventricular ejection fraction, by estimation, is 60 to 65%. The left ventricle has normal function. The left ventricle has no regional wall motion abnormalities. There is mild left ventricular hypertrophy. Left ventricular diastolic parameters are indeterminate. 2. Right ventricular systolic function is normal. The right ventricular size is normal. 3. Left atrial size was mildly dilated. 4. Mild mitral valve regurgitation. 5. The aortic valve is normal in structure. Aortic valve regurgitation is not visualized.  Comparison(s): The left ventricular function is unchanged compared to echo from September 2022.Marland Kitchen  FINDINGS Left Ventricle: Left ventricular ejection fraction, by estimation, is 60 to 65%. The left ventricle has normal function. The left ventricle has no regional wall motion abnormalities. The left ventricular internal cavity size was normal in size. There is mild left ventricular hypertrophy. Left ventricular diastolic parameters are indeterminate.  Right Ventricle: The right ventricular size is normal. Right vetricular wall thickness was not assessed. Right ventricular systolic function is  normal.  Left Atrium: Left atrial size was mildly dilated.  Right Atrium: Right atrial size was normal in size.  Pericardium: Trivial pericardial effusion is present.  Mitral Valve: There is mild thickening of the mitral valve leaflet(s). Mild mitral valve regurgitation.  Tricuspid Valve: The tricuspid valve is normal in structure. Tricuspid valve regurgitation is trivial.  Aortic Valve: The aortic valve is normal in structure. Aortic valve regurgitation is not visualized. Aortic valve mean gradient measures 4.0 mmHg. Aortic valve peak gradient measures 7.3 mmHg. Aortic valve area, by VTI measures 2.93 cm.  Pulmonic Valve: The pulmonic valve was normal in structure. Pulmonic valve regurgitation is not visualized.  Aorta: The aortic root and ascending aorta  Allergen Reactions   Baclofen Other (See Comments)    Severe dizziness   Doxycycline Hyclate Other (See Comments)    Flu-like symptoms   Gabapentin Swelling and Other (See Comments)    Swelling in feet   Hycodan [Hydrocodone Bit-Homatrop Mbr] Other (See Comments)    Dizziness    Hydrocodone-Acetaminophen Other (See Comments)    GI upset, Dizziness   Pregabalin Swelling and Other (See Comments)    Severly dry mouth     Labs/Other Studies Reviewed    The following studies were reviewed today:  Cardiac Studies & Procedures        ECHOCARDIOGRAM  ECHOCARDIOGRAM COMPLETE 04/25/2022  Narrative ECHOCARDIOGRAM REPORT    Patient Name:   [name] Date of Exam: 04/25/2022 Medical Rec #:  [id number]         Height:       76.0 in Accession #:    [id number]        Weight:       350.1 lb Date of Birth:  Mar 14, 1956        BSA:          2.810 m Patient Age:    67 years          BP:           169/102 mmHg Patient Gender: M                 HR:           90 bpm. Exam Location:  Inpatient  Procedure: 2D Echo, Color Doppler and Cardiac Doppler  Indications:    CHF- Acute Diastolic  History:        Patient has prior history of Echocardiogram examinations, most recent 04/02/2021. Arrythmias:Atrial Fibrillation; Risk Factors:Hypertension and Dyslipidemia.  Referring Phys: [id number] CAROLE N HALL  IMPRESSIONS   1. Left ventricular ejection fraction, by estimation, is 60 to 65%. The left ventricle has normal function. The left ventricle has no regional wall motion abnormalities. There is mild left ventricular hypertrophy. Left ventricular diastolic parameters are indeterminate. 2. Right ventricular systolic function is normal. The right ventricular size is normal. 3. Left atrial size was mildly dilated. 4. Mild mitral valve regurgitation. 5. The aortic valve is normal in structure. Aortic valve regurgitation is not visualized.  Comparison(s): The left ventricular function is unchanged compared to echo from September 2022.Marland Kitchen  FINDINGS Left Ventricle: Left ventricular ejection fraction, by estimation, is 60 to 65%. The left ventricle has normal function. The left ventricle has no regional wall motion abnormalities. The left ventricular internal cavity size was normal in size. There is mild left ventricular hypertrophy. Left ventricular diastolic parameters are indeterminate.  Right Ventricle: The right ventricular size is normal. Right vetricular wall thickness was not assessed. Right ventricular systolic function is  normal.  Left Atrium: Left atrial size was mildly dilated.  Right Atrium: Right atrial size was normal in size.  Pericardium: Trivial pericardial effusion is present.  Mitral Valve: There is mild thickening of the mitral valve leaflet(s). Mild mitral valve regurgitation.  Tricuspid Valve: The tricuspid valve is normal in structure. Tricuspid valve regurgitation is trivial.  Aortic Valve: The aortic valve is normal in structure. Aortic valve regurgitation is not visualized. Aortic valve mean gradient measures 4.0 mmHg. Aortic valve peak gradient measures 7.3 mmHg. Aortic valve area, by VTI measures 2.93 cm.  Pulmonic Valve: The pulmonic valve was normal in structure. Pulmonic valve regurgitation is not visualized.  Aorta: The aortic root and ascending aorta

## 2023-04-23 ENCOUNTER — Encounter: Payer: Self-pay | Admitting: Nurse Practitioner

## 2023-04-23 ENCOUNTER — Ambulatory Visit: Payer: Medicare Other | Attending: Nurse Practitioner | Admitting: Nurse Practitioner

## 2023-04-23 ENCOUNTER — Ambulatory Visit: Payer: Medicare Other | Admitting: Nurse Practitioner

## 2023-04-23 VITALS — BP 110/76 | HR 82 | Ht 76.0 in | Wt 284.6 lb

## 2023-04-23 DIAGNOSIS — I4821 Permanent atrial fibrillation: Secondary | ICD-10-CM | POA: Insufficient documentation

## 2023-04-23 DIAGNOSIS — I420 Dilated cardiomyopathy: Secondary | ICD-10-CM | POA: Insufficient documentation

## 2023-04-23 DIAGNOSIS — R0683 Snoring: Secondary | ICD-10-CM | POA: Diagnosis present

## 2023-04-23 DIAGNOSIS — I10 Essential (primary) hypertension: Secondary | ICD-10-CM | POA: Diagnosis present

## 2023-04-23 DIAGNOSIS — I340 Nonrheumatic mitral (valve) insufficiency: Secondary | ICD-10-CM | POA: Diagnosis not present

## 2023-04-23 DIAGNOSIS — N184 Chronic kidney disease, stage 4 (severe): Secondary | ICD-10-CM | POA: Insufficient documentation

## 2023-04-23 DIAGNOSIS — I5032 Chronic diastolic (congestive) heart failure: Secondary | ICD-10-CM | POA: Diagnosis not present

## 2023-04-23 NOTE — Patient Instructions (Signed)
Medication Instructions:  Your physician recommends that you continue on your current medications as directed. Please refer to the Current Medication list given to you today.  *If you need a refill on your cardiac medications before your next appointment, please call your pharmacy*   Lab Work: NONE ordered at this time of appointment     Testing/Procedures: NONE ordered at this time of appointment     Follow-Up: At [facility], you and your health needs are our priority.  As part of our continuing mission to provide you with exceptional heart care, we have created designated Provider Care Teams.  These Care Teams include your primary Cardiologist (physician) and Advanced Practice Providers (APPs -  Physician Assistants and Nurse Practitioners) who all work together to provide you with the care you need, when you need it.  We recommend signing up for the patient portal called "MyChart".  Sign up information is provided on this After Visit Summary.  MyChart is used to connect with patients for Virtual Visits (Telemedicine).  Patients are able to view lab/test results, encounter notes, upcoming appointments, etc.  Non-urgent messages can be sent to your provider as well.   To learn more about what you can do with MyChart, go to [email or web address].    Your next appointment:   6 week(s)  Provider:   Bernadene Person, NP

## 2023-04-30 ENCOUNTER — Other Ambulatory Visit: Payer: Self-pay | Admitting: Cardiology

## 2023-04-30 DIAGNOSIS — I4819 Other persistent atrial fibrillation: Secondary | ICD-10-CM

## 2023-04-30 NOTE — Telephone Encounter (Signed)
Eliquis 5mg  refill request received. Patient is 67 years old, weight-129.1kg, Crea-2.19 on 02/05/23, Diagnosis-Afib, and last seen by Bernadene Person on 04/23/23. Dose is appropriate based on dosing criteria. Will send in refill to requested pharmacy.

## 2023-05-11 ENCOUNTER — Other Ambulatory Visit: Payer: Self-pay | Admitting: Nurse Practitioner

## 2023-05-22 ENCOUNTER — Ambulatory Visit (INDEPENDENT_AMBULATORY_CARE_PROVIDER_SITE_OTHER): Payer: Medicare Other | Admitting: Podiatry

## 2023-05-22 ENCOUNTER — Encounter: Payer: Self-pay | Admitting: Podiatry

## 2023-05-22 DIAGNOSIS — B351 Tinea unguium: Secondary | ICD-10-CM

## 2023-05-22 DIAGNOSIS — Z7901 Long term (current) use of anticoagulants: Secondary | ICD-10-CM | POA: Diagnosis not present

## 2023-05-22 DIAGNOSIS — G6289 Other specified polyneuropathies: Secondary | ICD-10-CM

## 2023-05-22 MED ORDER — GABAPENTIN 100 MG PO CAPS: 100.0000 mg | ORAL_CAPSULE | Freq: Every day | ORAL | 2 refills | Status: DC

## 2023-05-22 NOTE — Progress Notes (Signed)
Chief Complaint  Patient presents with   Nail Problem    Nail trim both feet.  Does not have good circulation in toes, recently noticed.    HPI: 67 y.o. male presents today for painful, thickened, elongated, dystrophic toenails.  The nails become painful with patient in shoe gear.  He is unable to maintain them himself due to chronic anticoagulation therapy for A-fib.  He also presents with complaint of numbness and tingling to the toes bilaterally.  He denies diabetes.  He does have lower extremity edema secondary to cardiac issues, he does see [facility].  He does have history of chronic low back pain.  He does have history of kidney disease and was actually on dialysis for a period of time.  Past Medical History:  Diagnosis Date   Allergic rhinitis    Anxiety    Asthma    as a child   Cellulitis    Chronic kidney disease    Chronic low back pain    Complication of anesthesia    "hard to put asleep, hard to wake up, nausea and vomiting"   Depression    Dysrhythmia    ED (erectile dysfunction)    GERD (gastroesophageal reflux disease)    HLD (hyperlipidemia)    HTN (hypertension)    sees Dr. Elias Else, [facility]   Hypertrophy of prostate with urinary obstruction and other lower urinary tract symptoms (LUTS)    IBS (irritable bowel syndrome)    Inguinal hernia without mention of obstruction or gangrene, unilateral or unspecified, (not specified as recurrent)    Insomnia    Lumbar herniated disc    L7   Morbid obesity (HCC)    Narcotic addiction (HCC)    NICM (nonischemic cardiomyopathy) (HCC)    tachycardia mediated,  resolved with sinus   Persistent atrial fibrillation (HCC)    ablation done 03/2006 at [facility]   Pneumonia    hx of 2004   PONV (postoperative nausea and vomiting)    Sleep apnea    Twitching    legs    Past Surgical History:  Procedure Laterality Date   ATRIAL ABLATION SURGERY  2007   [facility]   CARDIOVERSION  08/11/2011    Procedure: CARDIOVERSION;  Surgeon: Lewayne Bunting, MD;  Location: [facility] OR;  Service: Cardiovascular;  Laterality: N/A;   CARDIOVERSION N/A 04/16/2017   Procedure: CARDIOVERSION;  Surgeon: Thurmon Fair, MD;  Location: [facility] ENDOSCOPY;  Service: Cardiovascular;  Laterality: N/A;   CARDIOVERSION N/A 03/04/2019   Procedure: CARDIOVERSION;  Surgeon: Lewayne Bunting, MD;  Location: [facility] ENDOSCOPY;  Service: Cardiovascular;  Laterality: N/A;   CARDIOVERSION N/A 07/04/2019   Procedure: CARDIOVERSION;  Surgeon: Chilton Si, MD;  Location: [facility] ENDOSCOPY;  Service: Cardiovascular;  Laterality: N/A;   EYE SURGERY     lasik, 1998   HERNIA REPAIR     1977   INGUINAL HERNIA REPAIR Right 08/26/2012   Procedure: LAPAROSCOPIC INGUINAL HERNIA;  Surgeon: Lodema Pilot, DO;  Location: [facility] OR;  Service: General;  Laterality: Right;  laparoscopic right inguinal hernia repair with mesh, umbilical hernia repair   INSERTION OF MESH Right 08/26/2012   Procedure: INSERTION OF MESH;  Surgeon: Lodema Pilot, DO;  Location: [facility] OR;  Service: General;  Laterality: Right;  right inguinal hernia   IR FLUORO GUIDE CV LINE RIGHT  08/15/2022   IR US GUIDE VASC ACCESS RIGHT  08/15/2022   TEE WITHOUT CARDIOVERSION N/A 03/04/2019   Procedure:  TRANSESOPHAGEAL ECHOCARDIOGRAM (TEE);  Surgeon: Lewayne Bunting, MD;  Location: [facility] ENDOSCOPY;  Service: Cardiovascular;  Laterality: N/A;   UMBILICAL HERNIA REPAIR N/A 08/26/2012   Procedure: HERNIA REPAIR UMBILICAL ADULT;  Surgeon: Lodema Pilot, DO;  Location: [facility] OR;  Service: General;  Laterality: N/A;    Allergies  Allergen Reactions   Baclofen Other (See Comments)    Severe dizziness   Doxycycline Hyclate Other (See Comments)    Flu-like symptoms   Gabapentin Swelling and Other (See Comments)    Swelling in feet   Hycodan [Hydrocodone Bit-Homatrop Mbr] Other (See Comments)    Dizziness    Hydrocodone-Acetaminophen Other (See Comments)    GI upset, Dizziness   Pregabalin Swelling and  Other (See Comments)    Severly dry mouth    ROS -negative except as stated in HPI   Physical Exam: There were no vitals filed for this visit.  General: The patient is alert and oriented x3 in no acute distress.  Dermatology: Skin is warm, dry and supple bilateral lower extremities. Interspaces are clear of maceration and debris.  Nails 1 through 5 bilaterally are notable for increased thickness, length, brittle yellow discoloration consistent with fungal infection.  Vascular: Faintly pedal pulses bilaterally secondary to edema. Capillary refill within normal limits.  +1 to +2 pitting edema.  Mild rubor present to the bilateral pretibial regions without significant hemosiderin deposition.  Neurological: Light touch sensation grossly diminished bilateral feet.  Protective sensation diminished to the forefoot and toes with Semmes Weinstein monofilament, present at 6/10 sites bilaterally.  Musculoskeletal Exam: No pedal deformities noted  Radiographic Exam:  Normal osseous mineralization. Joint spaces preserved.  No fractures or osseous irregularities noted.  Assessment/Plan of Care: 1. Chronic anticoagulation   2. Other polyneuropathy   3. Onychomycosis      Meds ordered this encounter  Medications   gabapentin (NEURONTIN) 100 MG capsule    Sig: Take 1 capsule (100 mg total) by mouth at bedtime.    Dispense:  30 capsule    Refill:  2   None  Discussed clinical findings with patient today.  Patient qualifies for at risk footcare due to peripheral neuropathy and chronic anticoagulation therapy  -Nails x 10 were debrided in thickness and length using aseptic nail nippers without incident.  The bur was used to thin down the nails. -I will start patient on low-dose gabapentin to be taken at night for neuropathic pain.  He states that it has affected his sleep.  -I would like to see if Qutenza would be an option for this patient going forward given his kidney issues, will discuss  with rep. This was discussed with patient. Review of his medical records suggest he has tried gabapentin and pregabalin in the past with some side effects. -Patient is asking about options regarding his leg swelling.  He does use the send if helped patient with past.  I did instruct the patient to discuss with his providers at [facility] if he is a candidate for lymphedema pumps. -Follow-up in 3 months for routine footcare or sooner if new pedal complaints complaints arise.  Addendum: -Clarification that patient is established with [facility], he will follow up there regarding possible lymphedema pumps   Nikira Kushnir L. Marchia Bond, AACFAS [facility]     [name]  [place], [place] [address]                Office [phone]  Fax [phone]

## 2023-06-08 ENCOUNTER — Ambulatory Visit: Payer: Medicare Other | Admitting: Nurse Practitioner

## 2023-06-08 NOTE — Progress Notes (Unsigned)
Office Visit    Patient Name: [name] Date of Encounter: 06/08/2023  Primary Care Provider:  Noberto Retort, MD Primary Cardiologist:  [name], MD  Chief Complaint    67 year old male with a history of permanent atrial fibrillation, cardiomyopathy with improved EF, chronic diastolic heart failure, mild to moderate mitral valve regurgitation, hypertension, hyperlipidemia, chronic venous insufficiency, CKD stage III-IV, and obesity who presents for follow-up related to heart failure.   Past Medical History    Past Medical History:  Diagnosis Date   Allergic rhinitis    Anxiety    Asthma    as a child   Cellulitis    Chronic kidney disease    Chronic low back pain    Complication of anesthesia    "hard to put asleep, hard to wake up, nausea and vomiting"   Depression    Dysrhythmia    ED (erectile dysfunction)    GERD (gastroesophageal reflux disease)    HLD (hyperlipidemia)    HTN (hypertension)    sees Dr. Elias Else, [facility]   Hypertrophy of prostate with urinary obstruction and other lower urinary tract symptoms (LUTS)    IBS (irritable bowel syndrome)    Inguinal hernia without mention of obstruction or gangrene, unilateral or unspecified, (not specified as recurrent)    Insomnia    Lumbar herniated disc    L7   Morbid obesity (HCC)    Narcotic addiction (HCC)    NICM (nonischemic cardiomyopathy) (HCC)    tachycardia mediated,  resolved with sinus   Persistent atrial fibrillation (HCC)    ablation done 03/2006 at [facility]   Pneumonia    hx of 2004   PONV (postoperative nausea and vomiting)    Sleep apnea    Twitching    legs   Past Surgical History:  Procedure Laterality Date   ATRIAL ABLATION SURGERY  2007   [facility]   CARDIOVERSION  08/11/2011   Procedure: CARDIOVERSION;  Surgeon: Lewayne Bunting, MD;  Location: [facility] OR;  Service: Cardiovascular;  Laterality: N/A;   CARDIOVERSION N/A 04/16/2017   Procedure: CARDIOVERSION;  Surgeon:  Thurmon Fair, MD;  Location: [facility] ENDOSCOPY;  Service: Cardiovascular;  Laterality: N/A;   CARDIOVERSION N/A 03/04/2019   Procedure: CARDIOVERSION;  Surgeon: Lewayne Bunting, MD;  Location: [facility] ENDOSCOPY;  Service: Cardiovascular;  Laterality: N/A;   CARDIOVERSION N/A 07/04/2019   Procedure: CARDIOVERSION;  Surgeon: Chilton Si, MD;  Location: [facility] ENDOSCOPY;  Service: Cardiovascular;  Laterality: N/A;   EYE SURGERY     lasik, 1998   HERNIA REPAIR     1977   INGUINAL HERNIA REPAIR Right 08/26/2012   Procedure: LAPAROSCOPIC INGUINAL HERNIA;  Surgeon: Lodema Pilot, DO;  Location: [facility] OR;  Service: General;  Laterality: Right;  laparoscopic right inguinal hernia repair with mesh, umbilical hernia repair   INSERTION OF MESH Right 08/26/2012   Procedure: INSERTION OF MESH;  Surgeon: Lodema Pilot, DO;  Location: [facility] OR;  Service: General;  Laterality: Right;  right inguinal hernia   IR FLUORO GUIDE CV LINE RIGHT  08/15/2022   IR US GUIDE VASC ACCESS RIGHT  08/15/2022   TEE WITHOUT CARDIOVERSION N/A 03/04/2019   Procedure: TRANSESOPHAGEAL ECHOCARDIOGRAM (TEE);  Surgeon: Lewayne Bunting, MD;  Location: [facility] ENDOSCOPY;  Service: Cardiovascular;  Laterality: N/A;   UMBILICAL HERNIA REPAIR N/A 08/26/2012   Procedure: HERNIA REPAIR UMBILICAL ADULT;  Surgeon: Lodema Pilot, DO;  Location: [facility] OR;  Service: General;  Laterality: N/A;    Allergies  Allergies  Allergen Reactions   Baclofen Other (See Comments)    Severe dizziness   Doxycycline Hyclate Other (See Comments)    Flu-like symptoms   Gabapentin Swelling and Other (See Comments)    Swelling in feet   Hycodan [Hydrocodone Bit-Homatrop Mbr] Other (See Comments)    Dizziness    Hydrocodone-Acetaminophen Other (See Comments)    GI upset, Dizziness   Pregabalin Swelling and Other (See Comments)    Severly dry mouth     Labs/Other Studies Reviewed    The following studies were reviewed today:  Cardiac Studies & Procedures        ECHOCARDIOGRAM  ECHOCARDIOGRAM COMPLETE 04/25/2022  Narrative ECHOCARDIOGRAM REPORT    Patient Name:   [name] Date of Exam: 04/25/2022 Medical Rec #:  [id number]         Height:       76.0 in Accession #:    [id number]        Weight:       350.1 lb Date of Birth:  11-Mar-1956        BSA:          2.810 m Patient Age:    65 years          BP:           169/102 mmHg Patient Gender: M                 HR:           90 bpm. Exam Location:  Inpatient  Procedure: 2D Echo, Color Doppler and Cardiac Doppler  Indications:    CHF- Acute Diastolic  History:        Patient has prior history of Echocardiogram examinations, most recent 04/02/2021. Arrythmias:Atrial Fibrillation; Risk Factors:Hypertension and Dyslipidemia.  Referring Phys: [id number] CAROLE N HALL  IMPRESSIONS   1. Left ventricular ejection fraction, by estimation, is 60 to 65%. The left ventricle has normal function. The left ventricle has no regional wall motion abnormalities. There is mild left ventricular hypertrophy. Left ventricular diastolic parameters are indeterminate. 2. Right ventricular systolic function is normal. The right ventricular size is normal. 3. Left atrial size was mildly dilated. 4. Mild mitral valve regurgitation. 5. The aortic valve is normal in structure. Aortic valve regurgitation is not visualized.  Comparison(s): The left ventricular function is unchanged compared to echo from September 2022.Marland Kitchen  FINDINGS Left Ventricle: Left ventricular ejection fraction, by estimation, is 60 to 65%. The left ventricle has normal function. The left ventricle has no regional wall motion abnormalities. The left ventricular internal cavity size was normal in size. There is mild left ventricular hypertrophy. Left ventricular diastolic parameters are indeterminate.  Right Ventricle: The right ventricular size is normal. Right vetricular wall thickness was not assessed. Right ventricular systolic function is  normal.  Left Atrium: Left atrial size was mildly dilated.  Right Atrium: Right atrial size was normal in size.  Pericardium: Trivial pericardial effusion is present.  Mitral Valve: There is mild thickening of the mitral valve leaflet(s). Mild mitral valve regurgitation.  Tricuspid Valve: The tricuspid valve is normal in structure. Tricuspid valve regurgitation is trivial.  Aortic Valve: The aortic valve is normal in structure. Aortic valve regurgitation is not visualized. Aortic valve mean gradient measures 4.0 mmHg. Aortic valve peak gradient measures 7.3 mmHg. Aortic valve area, by VTI measures 2.93 cm.  Pulmonic Valve: The pulmonic valve was normal in structure. Pulmonic valve regurgitation is not visualized.  Aorta: The aortic root and  ascending aorta are structurally normal, with no evidence of dilitation.  IAS/Shunts: The interatrial septum was not assessed.   LEFT VENTRICLE PLAX 2D LVIDd:         5.40 cm LVIDs:         3.70 cm LV PW:         1.20 cm LV IVS:        1.30 cm LVOT diam:     2.30 cm LV SV:         72 LV SV Index:   26 LVOT Area:     4.15 cm   RIGHT VENTRICLE TAPSE (M-mode): 1.8 cm  LEFT ATRIUM              Index        RIGHT ATRIUM           Index LA diam:        6.00 cm  2.13 cm/m   RA Area:     18.60 cm LA Vol (A2C):   124.5 ml 44.30 ml/m  RA Volume:   45.70 ml  16.26 ml/m LA Vol (A4C):   109.0 ml 38.79 ml/m LA Biplane Vol: 106.0 ml 37.72 ml/m AORTIC VALVE AV Area (Vmax):    3.23 cm AV Area (Vmean):   3.03 cm AV Area (VTI):     2.93 cm AV Vmax:           135.00 cm/s AV Vmean:          95.600 cm/s AV VTI:            0.247 m AV Peak Grad:      7.3 mmHg AV Mean Grad:      4.0 mmHg LVOT Vmax:         105.00 cm/s LVOT Vmean:        69.700 cm/s LVOT VTI:          0.174 m LVOT/AV VTI ratio: 0.70  AORTA Ao Root diam: 3.40 cm Ao Asc diam:  3.80 cm  MR Peak grad: 76.2 mmHg MR Vmax:      436.50 cm/s SHUNTS Systemic VTI:  0.17  m Systemic Diam: 2.30 cm  Dietrich Pates MD Electronically signed by Dietrich Pates MD Signature Date/Time: 04/25/2022/10:56:14 AM    Final   TEE  ECHO TEE 03/04/2019  Narrative TRANSESOPHOGEAL ECHO REPORT    Patient Name:   [name] Date of Exam: 03/04/2019 Medical Rec #:  [id number]         Height:       77.0 in Accession #:    [id number]        Weight:       329.0 lb Date of Birth:  12-28-1955        BSA:          2.76 m Patient Age:    62 years          BP:           144/101 mmHg Patient Gender: M                 HR:           123 bpm. Exam Location:  Inpatient   Procedure: Transesophageal Echo, Cardiac Doppler and Color Doppler  Indications:     Afib  History:         Patient has prior history of Echocardiogram examinations, most recent 03/31/2017. Atrial Fibrillation Risk Factors: Hypertension, Obesity and Sleep Apnea.  Sonographer:  Lavenia Atlas Referring Phys:  [id number] BRIAN S CRENSHAW Diagnosing Phys: [name] MD    PROCEDURE: The transesophogeal probe was passed through the esophogus of the patient. The patient developed no complications during the procedure.  IMPRESSIONS   1. The left ventricle has normal systolic function, with an ejection fraction of 55-60%. No evidence of left ventricular regional wall motion abnormalities. 2. The right ventricle has normal systolc function. The cavity was normal. 3. Left atrial size was severely dilated. 4. No evidence of a thrombus present in the left atrial appendage. 5. The mitral valve is abnormal. Mild thickening of the mitral valve leaflet. Mitral valve regurgitation is moderate to severe by color flow Doppler. 6. The aortic valve is tricuspid No stenosis of the aortic valve. 7. There is evidence of mild plaque in the descending aorta. 8. Normal LV systolic function; severe LAE with no LAA thrombus; mild RAE; moderately severe (3+) MR; mild TR.  FINDINGS Left Ventricle: The left ventricle has  normal systolic function, with an ejection fraction of 55-60%. No evidence of left ventricular regional wall motion abnormalities.  Right Ventricle: The right ventricle has normal systolic function. The cavity was normal.  Left Atrium: Left atrial size was severely dilated.   Left Atrial Appendage: No evidence of a thrombus present in the left atrial appendage.  Right Atrium: Right atrial size was mildly dilated.  Interatrial Septum: No atrial level shunt detected by color flow Doppler.  Pericardium: There is no evidence of pericardial effusion.  Mitral Valve: The mitral valve is abnormal. Mild thickening of the mitral valve leaflet. Mitral valve regurgitation is moderate to severe by color flow Doppler.  Tricuspid Valve: The tricuspid valve was normal in structure. Tricuspid valve regurgitation is mild by color flow Doppler.  Aortic Valve: The aortic valve is tricuspid Aortic valve regurgitation was not visualized by color flow Doppler. There is No stenosis of the aortic valve.  Pulmonic Valve: The pulmonic valve was grossly normal. Pulmonic valve regurgitation is not visualized by color flow Doppler.  Aorta: There is evidence of mild plaque in the descending aorta.  Additional Findings: Normal LV systolic function; severe LAE with no LAA thrombus; mild RAE; moderately severe (3+) MR; mild TR.   [name] MD Electronically signed by [name] MD Signature Date/Time: 03/04/2019/10:21:22 AM    Final           Recent Labs: 08/13/2022: B Natriuretic Peptide 74.5; TSH 1.144 08/14/2022: ALT 55 08/16/2022: Magnesium 1.7 02/05/2023: BUN 36; Creatinine, Ser 2.19; Hemoglobin 9.5; Platelets 337; Potassium 4.9; Sodium 133  Recent Lipid Panel    Component Value Date/Time   CHOL  02/14/2009 0630    126        ATP III CLASSIFICATION:  <200     mg/dL   Desirable  846-962  mg/dL   Borderline High  >=952    mg/dL   High          TRIG 95 02/14/2009 0630   HDL 48 02/14/2009 0630    CHOLHDL 2.6 02/14/2009 0630   VLDL 19 02/14/2009 0630   LDLCALC  02/14/2009 0630    59        Total Cholesterol/HDL:CHD Risk Coronary Heart Disease Risk Table                     Men   Women  1/2 Average Risk   3.4   3.3  Average Risk       5.0   4.4  2 X Average Risk   9.6   7.1  3 X Average Risk  23.4   11.0        Use the calculated Patient Ratio above and the CHD Risk Table to determine the patient's CHD Risk.        ATP III CLASSIFICATION (LDL):  <100     mg/dL   Optimal  403-474  mg/dL   Near or Above                    Optimal  130-159  mg/dL   Borderline  259-563  mg/dL   High  >875     mg/dL   Very High    History of Present Illness    68 year old male with the above past medical history including permanent atrial fibrillation, cardiomyopathy with improved EF, chronic diastolic heart failure, mild to moderate mitral valve regurgitation, hypertension, hyperlipidemia, chronic venous insufficiency, CKD stage III-IV, and obesity.   Cardiac catheterization in 2004 showed EF 45%, normal coronary arteries.  He had atrial fibrillation at the time, LV function improved with restoration of sinus rhythm.  He ultimately went atrial fibrillation ablation.  He did well for several years but then had recurrent atrial fibrillation.  TEE in August 2020 showed normal LV function, severe left atrial enlargement, moderate to severe mitral valve regurgitation, mild tricuspid valve regurgitation.  He underwent DCCV in 02/2019 with early recurrence of atrial fibrillation.  Repeat DCCV in December 2020 was also unsuccessful.  He was not felt to be a candidate for repeat ablation given severe left atrial enlargement, obesity.  He was evaluated by CT surgery for consideration of surgical maze procedure, medical therapy was ultimately advised.  Echocardiogram in 03/2021 showed normal LV function, mild LVH, mild RV, mild LAE, mild to moderate MR. At his follow-up visit in 03/2022 he noted worsening dyspnea  on exertion, chronic pedal edema.  His torsemide was increased to 60 mg twice daily with an additional 20 mg as needed for worsening edema.  Hydralazine was increased to 100 mg 3 times daily.  He was hospitalized in October 2023 in the setting of acute on chronic diastolic heart failure secondary to urinary retention due to enlarged prostate, AKI on CKD.  Urology and nephrology were consulted.  Repeat echocardiogram showed EF 60 to 65%, normal LV function, no RWMA, mild LVH, normal RV systolic function, mildly dilated left atrium, mild mitral valve regurgitation.  He was hospitalized in February 2024 in the setting of AKI on CKD stage IV (baseline creatinine 3.5-4.5), UTI with sepsis, uremia. He was discharged with a foley catheter, with subsequent improvement in his renal function.  His wife contacted our office on 11/04/2022 with complaints of worsening shortness of breath, orthopnea.  Torsemide was increased to 60 mg daily x 3 days followed by torsemide 40 mg daily. He noted significant improvement in his lower extremity edema loss with this change as well as adherence to sodium and fluid recommendations. He was last in the office on 04/23/2023 and was stable from a cardiac standpoint.  His lower extremity wounds had healed completely, he was gradually increasing his activity and generally feeling well.   He presents today for follow-up. Since his last visit he ha  1. Chronic diastolic heart failure/cardiomyopathy: Prior EF 45%. Cath at the time showed normal coronaries. Most recent echo in 04/2022 showed EF 60 to 65%, normal LV function, no RWMA, mild LVH, normal RV systolic function, mildly dilated left atrium, mild mitral valve  regurgitation.  He has a longstanding history of intermittent adherence to his medication regimen, dietary recommendations, and intermittent follow-up resulting in periods of marked fluid volume overload.  However, since his last visit he has done extremely well.  He has been taking  his medication, following diet recommendations, and has been more active.  He has had chronic wounds to his bilateral lower extremities which have healed completely in the setting of decreased remedy edema.  Generally euvolemic and well compensated on exam.  His weight is down.  I congratulated him on his progress and encouraged ongoing efforts.  Continue carvedilol, torsemide.    2. Permanent atrial fibrillation: Rate controlled. Continue diltiazem, carvedilol, Eliquis.    3. Mitral valve regurgitation: Mild on most recent echo.    4. Hypertension: BP well controlled. Continue current antihypertensive regimen.    5. CKD stage III-IV: Creatinine rose to 10.08 during hospitalization in 08/2022 (likely exacerbated by acute urinary retention, bladder outlet obstruction). Renal function improved significantly with placement of Foley catheter. Following with urology and nephrology.  Creatinine was 2.19 on 02/05/2023.  He has had repeat labs since this time, unfortunately, I do not have access to these labs today.  Conitnue to monitor.   6. Obesity: Activity has been limited in the setting of acute on chronic heart failure, multiple hospitalizations.  He is gradually increasing his activity as his symptoms of heart failure have improved.  Encouraged increased activity as tolerated.    7. History of snoring: Previously referred for sleep study, however, he ultimately declined.    8. Disposition: Follow-up in  Home Medications    Current Outpatient Medications  Medication Sig Dispense Refill   alfuzosin (UROXATRAL) 10 MG 24 hr tablet Take 10 mg by mouth daily with breakfast.     apixaban (ELIQUIS) 5 MG TABS tablet TAKE 1 TABLET BY MOUTH TWICE A DAY 60 tablet 5   carvedilol (COREG) 25 MG tablet Take 1 tablet (25 mg total) by mouth 2 (two) times daily. (Patient taking differently: Take 25 mg by mouth daily.) 180 tablet 3   clonazePAM (KLONOPIN) 0.5 MG tablet Take 0.25-0.5 mg by mouth at bedtime as needed  for anxiety.     diltiazem (CARDIZEM CD) 360 MG 24 hr capsule TAKE 1 CAPSULE BY MOUTH EVERY DAY 90 capsule 3   DULoxetine (CYMBALTA) 60 MG capsule Take 60 mg by mouth 2 (two) times daily.     ferrous sulfate 325 (65 FE) MG tablet Take 325 mg by mouth daily.     finasteride (PROSCAR) 5 MG tablet Take 1 tablet (5 mg total) by mouth daily. 30 tablet 0   gabapentin (NEURONTIN) 100 MG capsule Take 1 capsule (100 mg total) by mouth at bedtime. 30 capsule 2   Multiple Vitamins-Minerals (ZINC PO) Take 1 tablet by mouth daily.     omeprazole (PRILOSEC) 20 MG capsule Take 20 mg by mouth daily as needed (for acid reflux).     POTASSIUM PO Take 1 tablet by mouth daily.     rOPINIRole (REQUIP) 1 MG tablet Take 1-2 tablets (1-2 mg total) by mouth at bedtime as needed (restless leg syndrome). 1 tablet after lunch and 2 tablet at bedtime (Patient taking differently: Take 3 mg by mouth at bedtime.) 120 tablet 0   sulfamethoxazole-trimethoprim (BACTRIM) 400-80 MG tablet Take 1 tablet by mouth at bedtime. 10 tablet 0   torsemide (DEMADEX) 20 MG tablet TAKE 40 MG DAILY. MAY TAKE AN ADDITIONAL TABLET ON THE DAYS OF WEIGHT GAIN OR SWELLING.  270 tablet 1   Turmeric (QC TUMERIC COMPLEX PO) Take 1 capsule by mouth daily.     TYLENOL 500 MG tablet Take 1,500 mg by mouth as needed for mild pain or headache.     No current facility-administered medications for this visit.     Review of Systems    ***.  All other systems reviewed and are otherwise negative except as noted above.    Physical Exam    VS:  There were no vitals taken for this visit. , BMI There is no height or weight on file to calculate BMI. STOP-Bang Score:     { Consider Dx Sleep Disordered Breathing or Sleep Apnea  ICD G47.33          :1}    GEN: Well nourished, well developed, in no acute distress. HEENT: normal. Neck: Supple, no JVD, carotid bruits, or masses. Cardiac: RRR, no murmurs, rubs, or gallops. No clubbing, cyanosis, edema.   Radials/DP/PT 2+ and equal bilaterally.  Respiratory:  Respirations regular and unlabored, clear to auscultation bilaterally. GI: Soft, nontender, nondistended, BS + x 4. MS: no deformity or atrophy. Skin: warm and dry, no rash. Neuro:  Strength and sensation are intact. Psych: Normal affect.  Accessory Clinical Findings    ECG personally reviewed by me today -    - no acute changes.   Lab Results  Component Value Date   WBC 10.2 02/05/2023   HGB 9.5 (L) 02/05/2023   HCT 34.0 (L) 02/05/2023   MCV 86.1 02/05/2023   PLT 337 02/05/2023   Lab Results  Component Value Date   CREATININE 2.19 (H) 02/05/2023   BUN 36 (H) 02/05/2023   NA 133 (L) 02/05/2023   K 4.9 02/05/2023   CL 106 02/05/2023   CO2 19 (L) 02/05/2023   Lab Results  Component Value Date   ALT 55 (H) 08/14/2022   AST 38 08/14/2022   ALKPHOS 90 08/14/2022   BILITOT 0.7 08/14/2022   Lab Results  Component Value Date   CHOL  02/14/2009    126        ATP III CLASSIFICATION:  <200     mg/dL   Desirable  086-578  mg/dL   Borderline High  >=469    mg/dL   High          HDL 48 02/14/2009   LDLCALC  02/14/2009    59        Total Cholesterol/HDL:CHD Risk Coronary Heart Disease Risk Table                     Men   Women  1/2 Average Risk   3.4   3.3  Average Risk       5.0   4.4  2 X Average Risk   9.6   7.1  3 X Average Risk  23.4   11.0        Use the calculated Patient Ratio above and the CHD Risk Table to determine the patient's CHD Risk.        ATP III CLASSIFICATION (LDL):  <100     mg/dL   Optimal  629-528  mg/dL   Near or Above                    Optimal  130-159  mg/dL   Borderline  413-244  mg/dL   High  >010     mg/dL   Very High   TRIG 95 27/25/3664  CHOLHDL 2.6 02/14/2009    Lab Results  Component Value Date   HGBA1C 6.5 (H) 04/19/2021    Assessment & Plan    1.  ***  No BP recorded.  {Refresh Note OR Click here to enter BP  :1}***   Joylene Grapes, NP 06/08/2023, 6:39 AM

## 2023-07-06 ENCOUNTER — Other Ambulatory Visit: Payer: Self-pay | Admitting: Family Medicine

## 2023-07-06 DIAGNOSIS — R600 Localized edema: Secondary | ICD-10-CM

## 2023-07-09 ENCOUNTER — Encounter (HOSPITAL_BASED_OUTPATIENT_CLINIC_OR_DEPARTMENT_OTHER): Payer: Medicare Other | Attending: General Surgery | Admitting: General Surgery

## 2023-07-09 ENCOUNTER — Ambulatory Visit: Payer: Medicare Other | Admitting: Nurse Practitioner

## 2023-07-09 DIAGNOSIS — I420 Dilated cardiomyopathy: Secondary | ICD-10-CM | POA: Insufficient documentation

## 2023-07-09 DIAGNOSIS — L03115 Cellulitis of right lower limb: Secondary | ICD-10-CM | POA: Insufficient documentation

## 2023-07-09 DIAGNOSIS — I5032 Chronic diastolic (congestive) heart failure: Secondary | ICD-10-CM | POA: Diagnosis not present

## 2023-07-09 DIAGNOSIS — I130 Hypertensive heart and chronic kidney disease with heart failure and stage 1 through stage 4 chronic kidney disease, or unspecified chronic kidney disease: Secondary | ICD-10-CM | POA: Insufficient documentation

## 2023-07-09 DIAGNOSIS — Z6837 Body mass index (BMI) 37.0-37.9, adult: Secondary | ICD-10-CM | POA: Diagnosis not present

## 2023-07-09 DIAGNOSIS — I872 Venous insufficiency (chronic) (peripheral): Secondary | ICD-10-CM | POA: Diagnosis present

## 2023-07-09 DIAGNOSIS — L97812 Non-pressure chronic ulcer of other part of right lower leg with fat layer exposed: Secondary | ICD-10-CM | POA: Diagnosis not present

## 2023-07-09 DIAGNOSIS — I4819 Other persistent atrial fibrillation: Secondary | ICD-10-CM | POA: Diagnosis not present

## 2023-07-09 NOTE — Progress Notes (Addendum)
 [name] ([id number]) [id number] Page 1 of 12 Visit Report for 07/09/2023 Chief Complaint Document Details Patient Name: Date of Service: [name] Clinton RD O. 07/09/2023 9:00 A M Medical Record Number: [id number] Patient Account Number: [id number] Date of Birth/Sex: Treating RN: [birth year] (68 y.o. M) Primary Care Provider: Arloa [name] Other Clinician: Referring Provider: Treating Provider/Extender: [name] [name] [name] in Treatment: 0 Information Obtained from: Patient Chief Complaint Patient presents for treatment of an open ulcer due to venous insufficiency 03/23/2023; patient returns to clinic today for review of wounds and drainage on the right lower leg 07/09/2023: returns with same, cellulitis Electronic Signature(s) Signed: 07/09/2023 10:22:01 AM By: Clinton Delon MD FACS Previous Signature: 07/09/2023 9:22:47 AM Version By: Clinton Delon MD FACS Entered By: [name] on 07/09/2023 07:22:01 -------------------------------------------------------------------------------- Debridement Details Patient Name: Date of Service: [name] Clinton RD O. 07/09/2023 9:00 A M Medical Record Number: [id number] Patient Account Number: [id number] Date of Birth/Sex: Treating RN: July 28, 1955 (68 y.o. [name] Primary Care Provider: Arloa [name] Other Clinician: Referring Provider: Treating Provider/Extender: [name] [name] [name] in Treatment: 0 Debridement Performed for Assessment: Wound #8 Right,Circumferential Lower Leg Performed By: Physician Clinton Delon, MD The following information was scribed by: Merleen [name] The information was scribed for: [name] Debridement Type: Debridement Severity of Tissue Pre Debridement: Fat layer exposed Level of Consciousness (Pre-procedure): Awake and Alert Pre-procedure Verification/Time Out Yes - 10:00 Taken: Start Time: 10:00 Pain Control:  Lidocaine  4% Topical Solution Percent of Wound Bed Debrided: 25% T Area Debrided (cm): otal 159.75 Tissue and other material debrided: Non-Viable, Eschar, Slough, Skin: Epidermis, Slough Level: Skin/Epidermis Debridement Description: Selective/Open Wound Instrument: Curette Bleeding: Minimum Hemostasis Achieved: Pressure Response to Treatment: Procedure was tolerated well Level of Consciousness (Post- Awake and Alert procedure): Post Debridement Measurements of Total Wound Length: (cm) 22 Width: (cm) 37 Depth: (cm) 31 Volume: (cm) 80181.25 [name] ([id number]) [id number] Page 2 of 12 Character of Wound/Ulcer Post Debridement: Requires Further Debridement Severity of Tissue Post Debridement: Fat layer exposed Post Procedure Diagnosis Same as Pre-procedure Electronic Signature(s) Signed: 07/09/2023 11:29:00 AM By: Clinton Delon MD FACS Signed: 07/09/2023 4:36:15 PM By: Merleen Handing RN, BSN Entered By: Merleen [name] on 07/09/2023 07:15:30 -------------------------------------------------------------------------------- HPI Details Patient Name: Date of Service: [name] Clinton RD O. 07/09/2023 9:00 A M Medical Record Number: [id number] Patient Account Number: [id number] Date of Birth/Sex: Treating RN: Nov 21, 1955 (68 y.o. M) Primary Care Provider: Arloa [name] Other Clinician: Referring Provider: Treating Provider/Extender: [name] [name] [name] in Treatment: 0 History of Present Illness HPI Description: ADMISSION 02/24/2022 This is a 68 year old man with a past medical history significant for congestive heart failure, morbid obesity, dilated cardiomyopathy, atrial fibrillation, and lower extremity cellulitis. He is currently being treated with Keflex . He is not diabetic. He does not smoke. He says that he developed some blisters on his left anterior tibial surface which subsequently broke open causing the wounds for  which she is here to see us  today. He says that he occasionally wears compression stockings but does not do so on a regular basis. He reports that they look stupid with shorts. ABI in clinic today was 0.94. On the left anterior tibial surface, there are several small wounds in a geographic pattern. They do appear consistent with blisters that have ruptured. The fat layer is exposed and there is a thin layer of slough on the surfaces. He does have some surrounding erythema, but no purulent drainage. Edema control  is very poor. 03/03/2022: The patient came in for a nurse visit last week because he thought the compression wraps were too tight. They were reapplied and apparently he did not understand the instruction to try to stay off his feet is much as possible and keep his leg elevated; he instead walked excessively on both Friday and Saturday. As result his leg became more swollen, the wrap became uncomfortable and he cut it off yesterday. He replaced it with a wrap of his own fashion. Today he has an indentation in his leg where his homemade wrap ended. He has extensive 3+ nonpitting edema above this. The wounds themselves are in fairly decent condition with just a little bit of slough on the surface, but edema fluid is frankly pouring out of the largest wound. 03/14/2022: Significant improvement in his wound. He has come in a couple of times for nurse visits due to drainage. Today the wound is quite a bit smaller and just has a layer of eschar and slough on the surface. Edema control is better. 03/23/2022: Ongoing improvement in his wound. His edema control is suboptimal today, however, because his wrap slid down. There is also evidence of excoriation suggesting that perhaps he was scratching under the wrap and it slid. There is a little bit of eschar and slough on the wound surface. 03/28/2022: The patient has been fairly noncompliant with his compression wraps. As result, he has opened a new area of  wounding on his anterior tibial surface just proximal to the original site. It is limited breakdown of skin. The original site is smaller and has a little slough buildup. He did see his cardiologist this week and is now taking torsemide  twice a day. 04/04/2022: The patient continues to be fairly noncompliant with his care recommendations. He fails to show up for his venous reflux studies on Monday. He continues to cut his wraps off. Although he is supposed to be taking torsemide  twice a day, the degree of edema in his legs suggest that perhaps he is not being compliant with his medication, as well. His blood pressure was elevated today and he said that he did not take his antihypertensives. He claims that the methocarbamol  and tizanidine  he was prescribed for muscle spasms are responsible for his leg swelling. He has developed a new wound on his right anterior tibial surface. This is fairly small and limited to breakdown of skin. There is a small amount of slough present. The wounds on his left lower leg do not look at all improved. There is slough accumulation on a couple of the deeper spots. He also has an area on his dorsal foot and anterior ankle that look like they are threatening to breakdown; this is where he had cut his wrap back to so that they were rubbing right in this location. 04/14/2022: The right leg has healed and there has been substantial improvement to the wounds on the left. 04/21/2022: The left leg has deteriorated considerably. It is massively edematous and erythematous. The wound is much larger and is pouring fluid. He says that he got it wet in the shower when his cast protector apparently leaked. He says that he had to walk half a mile this morning to his car, as it had been parked some distance away. 05/01/2022: Since our last visit, the patient was admitted to the hospital due to shortness of breath and was found to have severe urinary retention. A Foley catheter was placed  and he was diuresed. He continues  to have the indwelling catheter and has an appointment with urology later today. His wound has not really improved at all. His leg is quite edematous with 3+ pitting edema to the mid thigh. The wound is erythematous and the skin is macerated. There is some slough and nonviable subcutaneous tissue present on the wound surface. READMISSION 07/17/2022 The patient returns after having not been seen since October. He was seen in the vein and vascular surgery clinic in November and found to have significant venous reflux. He is going to see Dr. Eliza sometime in the coming months, presumably to discuss saphenous vein ablation. They also recommended Unna boot compression and leg elevation, something the patient had been fairly noncompliant with during his first admission in our clinic. He returns today with residual ulcers on his left anterior tibial surface as well as a wound on his lateral foot, secondary to footwear injury. The wounds are dripping edema fluid and he [name], [name] ([id number]) [phone].pdf Page 3 of 12 is complaining of pain. There is slough accumulation on all of the open surfaces. Edema control is nonexistent. 07/24/2022: He is accompanied by his wife today. She says that they have had to change the dressings multiple times per day due to saturation from drainage. She asks if he can be seen on a daily basis in the wound care center. All of the wounds are smaller but do have some slough accumulation. They are still pouring edema fluid but the periwound skin is in better condition. Edema control remains poor. READMISSION 03/23/2023 This is a now 68 year old man we know from previous stays in the clinic. He was here in August to October 23 with a wound on his left lower leg he came for 2 visits in January 2024 with wounds on his left leg and left foot. I am not sure that he healed I would in fact I think not he simply stopped  coming. He has known venous insufficiency. He saw a vein and vascular in late 2023 noted to have venous reflux bilaterally in the anterior legs duplex ultrasound on the right showed deep vein reflux in the common femoral vein and superficial vein reflux of the super femoral junction and the greater saphenous vein. The greater saphenous vein showed reflux in the mid thigh and proximal calf. The patient has a weeping inflamed area on the right anterior lower leg with weeping edema fluid coming out a small wounds. He does not have anything definitively open on the left anterior lower leg although he has evidence of stasis dermatitis and some dry flaking skin. On the right he has been using copious amount of Band-Aids maxipads and changing the dressing frequently. His wife reminds us  that she thinks that the compression change once a week is insufficient to control his drainage. Both the intake nurse and myself told them that we might be able to work him in for a nurse visit once a week but we certainly cannot do that more frequently Past medical history is essentially unchanged he has a dilated cardiomyopathy, persistent atrial fibrillation receiving previous cardioversions. He has undergone a recent ablation of his prostate in August of this year. Chronic diastolic heart failure hypertension mild mitral regurgitation. He has chronic kidney disease with the most recent estimate estimated creatinine clearance of 42 and a most recent creatinine of 2.19 in August of this year. His ABI in our clinic was 1.29 9/23; patient arrives in clinic today with the wounds on the right leg healed. He did  not have anything open on the left. He has chronic venous insufficiency with severe stasis dermatitis. He brought in his stockings but they are probably support stockings in the 15 to 20 mmHg. We took leg measurements and suggest that he get 30-40 stockings at [facility] therapy in [place]. I note that he has had  previous venous reflux studies that showed reflux in the common femoral vein and superficial vein reflux in the saphenofemoral junction and greater saphenous vein on the right. He might actually be a candidate for ablation surgery if he comes back. Everything is closed today READMISSION 07/09/2023 Since his last admission to the clinic, he has undergone bilateral endovenous radiofrequency ablation of the saphenous veins. He has also had 2 injections of sclerotherapy solution. The most recent was done on December 17 and he unfortunately has developed cellulitis as a result. The right leg is warm, extremely swollen and tender, with a large circumferential wound that appears to be secondary to blistering. He has apparently taken 2 courses of amoxicillin; the patient's wife does not think it was actually Augmentin but just straight amoxicillin without the clavulanic acid. He has failed to demonstrate any improvement and he returns to the wound care center for further evaluation and management. Electronic Signature(s) Signed: 07/09/2023 10:25:01 AM By: Clinton Nest MD FACS Entered By: Clinton Nest on 07/09/2023 07:25:01 -------------------------------------------------------------------------------- Physical Exam Details Patient Name: Date of Service: [name] Clinton RD O. 07/09/2023 9:00 A M Medical Record Number: [id number] Patient Account Number: [id number] Date of Birth/Sex: Treating RN: [birth year] (68 y.o. M) Primary Care Provider: Arloa [name] Other Clinician: Referring Provider: Treating Provider/Extender: Clinton Nest [name] [name] in Treatment: 0 Constitutional . . . . No acute distress. Respiratory Normal work of breathing on room air. Notes 07/09/2023: His right lower leg is circumferentially open with peeling skin, slough, and eschar. The whole leg is quite edematous and erythematous with quite a bit of pain and heat. The edema and erythema extend up into his  medial thigh area. Electronic Signature(s) Signed: 07/09/2023 10:31:23 AM By: Clinton Nest MD FACS Entered By: Clinton Nest on 07/09/2023 07:31:23 [name] ([id number]) [id number] Page 4 of 12 -------------------------------------------------------------------------------- Physician Orders Details Patient Name: Date of Service: [name] Clinton RD O. 07/09/2023 9:00 A M Medical Record Number: [id number] Patient Account Number: [id number] Date of Birth/Sex: Treating RN: 1956-06-07 (68 y.o. Clinton Aver, Rock Primary Care Provider: Arloa [name] Other Clinician: Referring Provider: Treating Provider/Extender: Clinton Nest [name] [name] in Treatment: 0 The following information was scribed by: Aver Rock The information was scribed for: Clinton Nest Verbal / Phone Orders: No Diagnosis Coding ICD-10 Coding Code Description [phone] Non-pressure chronic ulcer of other part of right lower leg with fat layer exposed L03.115 Cellulitis of right lower limb I87.2 Venous insufficiency (chronic) (peripheral) I50.32 Chronic diastolic (congestive) heart failure Follow-up Appointments ppointment in 1 week. - Dr. Marolyn Return A Anesthetic Wound #8 Right,Circumferential Lower Leg (In clinic) Topical Lidocaine  4% applied to wound bed Bathing/ Shower/ Hygiene May shower with protection but do not get wound dressing(s) wet. Protect dressing(s) with water  repellant cover (for example, large plastic bag) or a cast cover and may then take shower. Edema Control - Orders / Instructions Right Lower Extremity Elevate legs to the level of the heart or above for 30 minutes daily and/or when sitting for 3-4 times a day throughout the day. Avoid standing for long periods of time. Exercise regularly Wound Treatment Wound #8 - Lower Leg Wound Laterality: Right,  Circumferential Topical: Mupirocin Ointment 1 x Per Week Discharge Instructions:  Apply Mupirocin (Bactroban) as instructed Prim Dressing: PolyMem Silver Non-Adhesive Dressing, 4.25x4.25 in 1 x Per Week ary Discharge Instructions: Apply to wound bed as instructed Secondary Dressing: ABD Pad, 8x10 1 x Per Week Discharge Instructions: Apply over primary dressing as directed. Compression Wrap: Kerlix Roll 4.5x3.1 (in/yd) 1 x Per Week Discharge Instructions: Apply Kerlix and Coban compression as directed. Compression Wrap: Coban Self-Adherent Wrap 4x5 (in/yd) 1 x Per Week Discharge Instructions: Apply over Kerlix as directed. Patient Medications llergies: hydrocodone , baclofen, doxycycline  HCl, gabapentin , pregabalin  A Notifications Medication Indication Start End prior to debridement 07/09/2023 lidocaine  DOSE topical 4 % cream - cream topical 07/09/2023 Bactrim  DS DOSE oral 800 mg-160 mg tablet - 1 tab p.o. twice daily x 10 days Electronic Signature(s) DUTCH, ING ([id number]) [id number] Page 5 of 12 Signed: 07/09/2023 11:29:00 AM By: Clinton Nest MD FACS Previous Signature: 07/09/2023 10:35:02 AM Version By: Clinton Nest MD FACS Entered By: Clinton Nest on 07/09/2023 07:35:31 -------------------------------------------------------------------------------- Problem List Details Patient Name: Date of Service: [name] Clinton RD O. 07/09/2023 9:00 A M Medical Record Number: [id number] Patient Account Number: [id number] Date of Birth/Sex: Treating RN: [birth year] (68 y.o. M) Primary Care Provider: Arloa [name] Other Clinician: Referring Provider: Treating Provider/Extender: Clinton Nest [name] [name] in Treatment: 0 Active Problems ICD-10 Encounter Code Description Active Date MDM Diagnosis [phone] Non-pressure chronic ulcer of other part of right lower leg with fat layer 07/09/2023 No Yes exposed L03.115 Cellulitis of right lower limb 07/09/2023 No Yes I87.2 Venous insufficiency (chronic) (peripheral) 07/09/2023  No Yes I50.32 Chronic diastolic (congestive) heart failure 07/09/2023 No Yes Inactive Problems Resolved Problems Electronic Signature(s) Signed: 07/09/2023 10:21:15 AM By: Clinton Nest MD FACS Previous Signature: 07/09/2023 9:21:54 AM Version By: Clinton Nest MD FACS Entered By: Clinton Nest on 07/09/2023 07:21:15 -------------------------------------------------------------------------------- Progress Note Details Patient Name: Date of Service: [name] Clinton RD O. 07/09/2023 9:00 A M Medical Record Number: [id number] Patient Account Number: [id number] Date of Birth/Sex: Treating RN: 08/13/1955 (68 y.o. M) Primary Care Provider: Arloa [name] Other Clinician: Referring Provider: Treating Provider/Extender: Clinton Nest [name] [name] in Treatment: 0 Subjective Chief Complaint Information obtained from Patient Patient presents for treatment of an open ulcer due to venous insufficiency 03/23/2023; patient returns to clinic today for review of wounds and drainage on the GHAZI, RUMPF ([id number]) [phone].pdf Page 6 of 12 right lower leg 07/09/2023: returns with same, cellulitis History of Present Illness (HPI) ADMISSION 02/24/2022 This is a 68 year old man with a past medical history significant for congestive heart failure, morbid obesity, dilated cardiomyopathy, atrial fibrillation, and lower extremity cellulitis. He is currently being treated with Keflex . He is not diabetic. He does not smoke. He says that he developed some blisters on his left anterior tibial surface which subsequently broke open causing the wounds for which she is here to see us  today. He says that he occasionally wears compression stockings but does not do so on a regular basis. He reports that they look stupid with shorts. ABI in clinic today was 0.94. On the left anterior tibial surface, there are several small wounds in a geographic pattern. They do appear  consistent with blisters that have ruptured. The fat layer is exposed and there is a thin layer of slough on the surfaces. He does have some surrounding erythema, but no purulent drainage. Edema control is very poor. 03/03/2022: The patient came in for a nurse visit last week because he thought  the compression wraps were too tight. They were reapplied and apparently he did not understand the instruction to try to stay off his feet is much as possible and keep his leg elevated; he instead walked excessively on both Friday and Saturday. As result his leg became more swollen, the wrap became uncomfortable and he cut it off yesterday. He replaced it with a wrap of his own fashion. Today he has an indentation in his leg where his homemade wrap ended. He has extensive 3+ nonpitting edema above this. The wounds themselves are in fairly decent condition with just a little bit of slough on the surface, but edema fluid is frankly pouring out of the largest wound. 03/14/2022: Significant improvement in his wound. He has come in a couple of times for nurse visits due to drainage. Today the wound is quite a bit smaller and just has a layer of eschar and slough on the surface. Edema control is better. 03/23/2022: Ongoing improvement in his wound. His edema control is suboptimal today, however, because his wrap slid down. There is also evidence of excoriation suggesting that perhaps he was scratching under the wrap and it slid. There is a little bit of eschar and slough on the wound surface. 03/28/2022: The patient has been fairly noncompliant with his compression wraps. As result, he has opened a new area of wounding on his anterior tibial surface just proximal to the original site. It is limited breakdown of skin. The original site is smaller and has a little slough buildup. He did see his cardiologist this week and is now taking torsemide  twice a day. 04/04/2022: The patient continues to be fairly noncompliant with his  care recommendations. He fails to show up for his venous reflux studies on Monday. He continues to cut his wraps off. Although he is supposed to be taking torsemide  twice a day, the degree of edema in his legs suggest that perhaps he is not being compliant with his medication, as well. His blood pressure was elevated today and he said that he did not take his antihypertensives. He claims that the methocarbamol  and tizanidine  he was prescribed for muscle spasms are responsible for his leg swelling. He has developed a new wound on his right anterior tibial surface. This is fairly small and limited to breakdown of skin. There is a small amount of slough present. The wounds on his left lower leg do not look at all improved. There is slough accumulation on a couple of the deeper spots. He also has an area on his dorsal foot and anterior ankle that look like they are threatening to breakdown; this is where he had cut his wrap back to so that they were rubbing right in this location. 04/14/2022: The right leg has healed and there has been substantial improvement to the wounds on the left. 04/21/2022: The left leg has deteriorated considerably. It is massively edematous and erythematous. The wound is much larger and is pouring fluid. He says that he got it wet in the shower when his cast protector apparently leaked. He says that he had to walk half a mile this morning to his car, as it had been parked some distance away. 05/01/2022: Since our last visit, the patient was admitted to the hospital due to shortness of breath and was found to have severe urinary retention. A Foley catheter was placed and he was diuresed. He continues to have the indwelling catheter and has an appointment with urology later today. His wound has not really  improved at all. His leg is quite edematous with 3+ pitting edema to the mid thigh. The wound is erythematous and the skin is macerated. There is some slough and nonviable  subcutaneous tissue present on the wound surface. READMISSION 07/17/2022 The patient returns after having not been seen since October. He was seen in the vein and vascular surgery clinic in November and found to have significant venous reflux. He is going to see Dr. Eliza sometime in the coming months, presumably to discuss saphenous vein ablation. They also recommended Unna boot compression and leg elevation, something the patient had been fairly noncompliant with during his first admission in our clinic. He returns today with residual ulcers on his left anterior tibial surface as well as a wound on his lateral foot, secondary to footwear injury. The wounds are dripping edema fluid and he is complaining of pain. There is slough accumulation on all of the open surfaces. Edema control is nonexistent. 07/24/2022: He is accompanied by his wife today. She says that they have had to change the dressings multiple times per day due to saturation from drainage. She asks if he can be seen on a daily basis in the wound care center. All of the wounds are smaller but do have some slough accumulation. They are still pouring edema fluid but the periwound skin is in better condition. Edema control remains poor. READMISSION 03/23/2023 This is a now 68 year old man we know from previous stays in the clinic. He was here in August to October 23 with a wound on his left lower leg he came for 2 visits in January 2024 with wounds on his left leg and left foot. I am not sure that he healed I would in fact I think not he simply stopped coming. He has known venous insufficiency. He saw a vein and vascular in late 2023 noted to have venous reflux bilaterally in the anterior legs duplex ultrasound on the right showed deep vein reflux in the common femoral vein and superficial vein reflux of the super femoral junction and the greater saphenous vein. The greater saphenous vein showed reflux in the mid thigh and proximal  calf. The patient has a weeping inflamed area on the right anterior lower leg with weeping edema fluid coming out a small wounds. He does not have anything definitively open on the left anterior lower leg although he has evidence of stasis dermatitis and some dry flaking skin. On the right he has been using copious amount of Band-Aids maxipads and changing the dressing frequently. His wife reminds us  that she thinks that the compression change once a week is insufficient to control his drainage. Both the intake nurse and myself told them that we might be able to work him in for a nurse visit once a week but we certainly cannot do that more frequently Past medical history is essentially unchanged he has a dilated cardiomyopathy, persistent atrial fibrillation receiving previous cardioversions. He has undergone a recent ablation of his prostate in August of this year. Chronic diastolic heart failure hypertension mild mitral regurgitation. He has chronic kidney disease with the most recent estimate estimated creatinine clearance of 42 and a most recent creatinine of 2.19 in August of this year. His ABI in our clinic was 1.29 9/23; patient arrives in clinic today with the wounds on the right leg healed. He did not have anything open on the left. He has chronic venous insufficiency with severe stasis dermatitis. He brought in his stockings but they are probably support  stockings in the 15 to 20 mmHg. We took leg measurements and suggest that he get 30-40 stockings at elastic therapy in [place]. I note that he has had previous venous reflux studies that showed reflux in the common femoral vein and superficial vein reflux in the saphenofemoral junction and greater saphenous vein on the right. He might actually be a candidate for ablation surgery if he comes back. Everything is closed today [name], [name] ([id number]) [id number] Page 7 of 12 READMISSION 07/09/2023 Since his  last admission to the clinic, he has undergone bilateral endovenous radiofrequency ablation of the saphenous veins. He has also had 2 injections of sclerotherapy solution. The most recent was done on December 17 and he unfortunately has developed cellulitis as a result. The right leg is warm, extremely swollen and tender, with a large circumferential wound that appears to be secondary to blistering. He has apparently taken 2 courses of amoxicillin; the patient's wife does not think it was actually Augmentin but just straight amoxicillin without the clavulanic acid. He has failed to demonstrate any improvement and he returns to the wound care center for further evaluation and management. Patient History Information obtained from Patient, Caregiver, Chart. Allergies hydrocodone  (Severity: Moderate, Reaction: rash), baclofen, doxycycline  HCl, gabapentin , pregabalin  Family History Cancer - Father,Mother, Hypertension - Mother,Father, No family history of Diabetes, Heart Disease, Hereditary Spherocytosis, Kidney Disease, Lung Disease, Seizures, Thyroid  Problems, Tuberculosis. 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 Medical History Eyes Patient has history of Cataracts - 2021 Denies history of Glaucoma, Optic Neuritis Ear/Nose/Mouth/Throat Denies history of Chronic sinus problems/congestion, Middle ear problems Hematologic/Lymphatic Patient has history of Lymphedema Respiratory Patient has history of Sleep Apnea Cardiovascular Patient has history of Congestive Heart Failure, Coronary Artery Disease, Hypertension Denies history of Deep Vein Thrombosis Gastrointestinal Denies history of Cirrhosis , Colitis, Crohns Endocrine Denies history of Type I Diabetes, Type II Diabetes Genitourinary Denies history of End Stage Renal Disease Immunological Denies history of Raynauds, Scleroderma Integumentary  (Skin) Denies history of History of Burn Musculoskeletal Denies history of Gout, Rheumatoid Arthritis, Osteoarthritis, Osteomyelitis Neurologic Patient has history of Neuropathy Denies history of Dementia, Quadriplegia, Paraplegia, Seizure Disorder Oncologic Denies history of Received Chemotherapy, Received Radiation Psychiatric Denies history of Anorexia/bulimia, Confinement Anxiety Hospitalization/Surgery History - Inguinal Hernia repair- 2014. - insertion of mesh-2014. - Atrial ablation surgery- 2007. - BIla eyes lasik- 2008. - hernia repair- 1977. - enlarge prostate sx 02/22/2023. Medical A Surgical History Notes nd Constitutional Symptoms (General Health) obesity Respiratory Allergic Rhinitis Cardiovascular AFIB, mitral valve regurgitation, cardiomyopathy, hyperlipidemia Genitourinary renal failure, prostate surgery Musculoskeletal restless leg syndrome Review of Systems (ROS) Constitutional Symptoms (General Health) Complains or has symptoms of Chills. Denies complaints or symptoms of Fever. Eyes Denies complaints or symptoms of Dry Eyes, Vision Changes, Glasses / Contacts. Ear/Nose/Mouth/Throat Denies complaints or symptoms of Chronic sinus problems or rhinitis. Respiratory Denies complaints or symptoms of Chronic or frequent coughs, Shortness of Breath. Gastrointestinal Denies complaints or symptoms of Frequent diarrhea, Nausea, Vomiting. Endocrine Denies complaints or symptoms of Heat/cold intolerance. Genitourinary Denies complaints or symptoms of Frequent urination. Integumentary (Skin) Complains or has symptoms of Wounds - right lower leg. Musculoskeletal Denies complaints or symptoms of Muscle Pain, Muscle Weakness. Neurologic [name], [name] ([id number]) [id number] Page 8 of 12 Denies complaints or symptoms of Numbness/parasthesias. Psychiatric Denies complaints or symptoms of Claustrophobia. Objective Constitutional No  acute distress. Vitals Time Taken: 9:19 AM, Height:  75 in, Weight: 302 lbs, BMI: 37.7, Temperature: 99.7 F, Pulse: 80 bpm, Respiratory Rate: 22 breaths/min, Blood Pressure: 101/65 mmHg. Respiratory Normal work of breathing on room air. General Notes: 07/09/2023: His right lower leg is circumferentially open with peeling skin, slough, and eschar. The whole leg is quite edematous and erythematous with quite a bit of pain and heat. The edema and erythema extend up into his medial thigh area. Integumentary (Hair, Skin) Wound #8 status is Open. Original cause of wound was Gradually Appeared. The date acquired was: 06/26/2023. The wound is located on the Right,Circumferential Lower Leg. The wound measures 22cm length x 37cm width x 0.1cm depth; 639.314cm^2 area and 63.931cm^3 volume. There is Fat Layer (Subcutaneous Tissue) exposed. There is no tunneling or undermining noted. There is a medium amount of serous drainage noted. The wound margin is indistinct and nonvisible. There is small (1-33%) red granulation within the wound bed. There is a large (67-100%) amount of necrotic tissue within the wound bed including Eschar and Adherent Slough. The periwound skin appearance had no abnormalities noted for texture. The periwound skin appearance exhibited: Dry/Scaly, Hemosiderin Staining, Erythema. The surrounding wound skin color is noted with erythema which is circumferential. Periwound temperature was noted as Hot. The periwound has tenderness on palpation. Assessment Active Problems ICD-10 Non-pressure chronic ulcer of other part of right lower leg with fat layer exposed Cellulitis of right lower limb Venous insufficiency (chronic) (peripheral) Chronic diastolic (congestive) heart failure Procedures Wound #8 Pre-procedure diagnosis of Wound #8 is a Venous Leg Ulcer located on the Right,Circumferential Lower Leg .Severity of Tissue Pre Debridement is: Fat layer exposed. There was a Selective/Open Wound  Skin/Epidermis Debridement with a total area of 159.75 sq cm performed by Clinton Nest, MD. With the following instrument(s): Curette to remove Non-Viable tissue/material. Material removed includes Eschar, Slough, and Skin: Epidermis after achieving pain control using Lidocaine  4% T opical Solution. No specimens were taken. A time out was conducted at 10:00, prior to the start of the procedure. A Minimum amount of bleeding was controlled with Pressure. The procedure was tolerated well. Post Debridement Measurements: 22cm length x 37cm width x 31cm depth; 19818.74cm^3 volume. Character of Wound/Ulcer Post Debridement requires further debridement. Severity of Tissue Post Debridement is: Fat layer exposed. Post procedure Diagnosis Wound #8: Same as Pre-Procedure Plan Follow-up Appointments: Return Appointment in 1 week. - Dr. Marolyn Anesthetic: Wound #8 Right,Circumferential Lower Leg: (In clinic) Topical Lidocaine  4% applied to wound bed Bathing/ Shower/ Hygiene: May shower with protection but do not get wound dressing(s) wet. Protect dressing(s) with water  repellant cover (for example, large plastic bag) or a cast cover and may then take shower. Edema Control - Orders / Instructions: Elevate legs to the level of the heart or above for 30 minutes daily and/or when sitting for 3-4 times a day throughout the day. Avoid standing for long periods of time. Exercise regularly [name], [name] ([id number]) [id number] Page 9 of 12 The following medication(s) was prescribed: lidocaine  topical 4 % cream cream topical for prior to debridement was prescribed at facility Bactrim  DS oral 800 mg-160 mg tablet 1 tab p.o. twice daily x 10 days starting 07/09/2023 WOUND #8: - Lower Leg Wound Laterality: Right, Circumferential Topical: Mupirocin Ointment 1 x Per Week/ Discharge Instructions: Apply Mupirocin (Bactroban) as instructed Prim Dressing: PolyMem Silver Non-Adhesive  Dressing, 4.25x4.25 in 1 x Per Week/ ary Discharge Instructions: Apply to wound bed as instructed Secondary Dressing: ABD Pad, 8x10 1 x Per Week/ Discharge Instructions: Apply over  primary dressing as directed. Com pression Wrap: Kerlix Roll 4.5x3.1 (in/yd) 1 x Per Week/ Discharge Instructions: Apply Kerlix and Coban compression as directed. Com pression Wrap: Coban Self-Adherent Wrap 4x5 (in/yd) 1 x Per Week/ Discharge Instructions: Apply over Kerlix as directed. 07/09/2023: He returns to clinic today after undergoing bilateral successful endovenous radiofrequency ablations of his saphenous veins. He unfortunately seems to have had a negative response to subsequent sclerotherapy injection in the right leg. His right lower leg is circumferentially open with peeling skin, slough, and eschar. The whole leg is quite edematous and erythematous with quite a bit of pain and heat. The edema and erythema extend up into his medial thigh area. I used a curette to debride slough, eschar, and dry skin from his wound. I was able to review his primary care doctors orders and he was in fact taking Augmentin; I suspect this may be a MRSA infection which would not respond to Augmentin, given the patient's lack of improvement after 2 courses of that drug. We are going to apply topical mupirocin with PolyMem Ag, Kerlix and Coban wrap. I have sent in a prescription for Bactrim , as the patient has a doxycycline  allergy. He will follow-up in 1 week. Electronic Signature(s) Signed: 07/10/2023 1:00:54 PM By: Drury Nestle RN, BSN Signed: 07/13/2023 8:37:20 AM By: Clinton Nest MD FACS Previous Signature: 07/09/2023 10:37:11 AM Version By: Clinton Nest MD FACS Entered By: Drury Nestle on 07/10/2023 10:00:16 -------------------------------------------------------------------------------- HxROS Details Patient Name: Date of Service: [name] Clinton RD O. 07/09/2023 9:00 A M Medical Record Number: [id number] Patient  Account Number: [id number] Date of Birth/Sex: Treating RN: [birth year] (68 y.o. [name] Primary Care Provider: Arloa [name] Other Clinician: Referring Provider: Treating Provider/Extender: Clinton Nest [name] [name] in Treatment: 0 Information Obtained From Patient Caregiver Chart Constitutional Symptoms (General Health) Complaints and Symptoms: Positive for: Chills Negative for: Fever Medical History: Past Medical History Notes: obesity Eyes Complaints and Symptoms: Negative for: Dry Eyes; Vision Changes; Glasses / Contacts Medical History: Positive for: Cataracts - 2021 Negative for: Glaucoma; Optic Neuritis Ear/Nose/Mouth/Throat Complaints and Symptoms: Negative for: Chronic sinus problems or rhinitis Medical History: Negative for: Chronic sinus problems/congestion; Middle ear problems [name], [name] ([id number]) [id number] Page 10 of 12 Respiratory Complaints and Symptoms: Negative for: Chronic or frequent coughs; Shortness of Breath Medical History: Positive for: Sleep Apnea Past Medical History Notes: Allergic Rhinitis Gastrointestinal Complaints and Symptoms: Negative for: Frequent diarrhea; Nausea; Vomiting Medical History: Negative for: Cirrhosis ; Colitis; Crohns Endocrine Complaints and Symptoms: Negative for: Heat/cold intolerance Medical History: Negative for: Type I Diabetes; Type II Diabetes Genitourinary Complaints and Symptoms: Negative for: Frequent urination Medical History: Negative for: End Stage Renal Disease Past Medical History Notes: renal failure, prostate surgery Integumentary (Skin) Complaints and Symptoms: Positive for: Wounds - right lower leg Medical History: Negative for: History of Burn Musculoskeletal Complaints and Symptoms: Negative for: Muscle Pain; Muscle Weakness Medical History: Negative for: Gout; Rheumatoid Arthritis; Osteoarthritis; Osteomyelitis Past  Medical History Notes: restless leg syndrome Neurologic Complaints and Symptoms: Negative for: Numbness/parasthesias Medical History: Positive for: Neuropathy Negative for: Dementia; Quadriplegia; Paraplegia; Seizure Disorder Psychiatric Complaints and Symptoms: Negative for: Claustrophobia Medical History: Negative for: Anorexia/bulimia; Confinement Anxiety Hematologic/Lymphatic Medical History: Positive for: Lymphedema Cardiovascular Medical History: Positive for: Congestive Heart Failure; Coronary Artery Disease; Hypertension Negative for: Deep Vein Thrombosis Past Medical History Notes: AFIB, mitral valve regurgitation, cardiomyopathy, hyperlipidemia [name], [name] ([id number]) [id number] Page 11 of 12 Immunological Medical History: Negative for: Raynauds; Scleroderma Oncologic Medical  History: Negative for: Received Chemotherapy; Received Radiation HBO Extended History Items Eyes: Cataracts Immunizations Pneumococcal Vaccine: Received Pneumococcal Vaccination: Yes Received Pneumococcal Vaccination On or After 60th Birthday: Yes Implantable Devices None Hospitalization / Surgery History Type of Hospitalization/Surgery Inguinal Hernia repair- 2014 insertion of mesh-2014 Atrial ablation surgery- 2007 BIla eyes lasik- 2008 hernia repair- 1977 enlarge prostate sx 02/22/2023 Family and Social History Cancer: Yes - Father,Mother; Diabetes: No; Heart Disease: No; Hereditary Spherocytosis: No; Hypertension: Yes - Mother,Father; Kidney Disease: No; Lung Disease: No; Seizures: No; Thyroid  Problems: No; Tuberculosis: No; Never smoker; Marital Status - Married; Alcohol Use: Never; Drug Use: No History; Caffeine Use: Daily - [employer] dew Social Determinants of Health (SDOH) 1. In the past 2 months, did you or others you live with eat smaller meals or skip meals because you didn't have money for foodo : No 2. Are you homeless or worried that you  might be in the futureo : No 3. Do you have trouble paying for your utilities (gas, electricity, phone)o : No 4. Do you have trouble finding or paying for a rideo : No 5. Do you need daycare, or better daycare, for your kidso : No 6. Are you unemployed or without regular incomeo : No 7. Do you need help finding a better jobo : No 8. Do you need help getting more educationo : No 9. Are you concerned about someone in your home using drugs or alcoholo : No 10. Do you feel unsafe in your daily lifeo : No 11. Is anyone in your home threatening or abusing youo : No 12. Do you lack quality relationships that make you feel valued and supportedo : No 13. Do you need help getting cultural information in a language you understando : No 14. Do you need help getting internet accesso : No Advanced Directives and Instructions Spiritual or Cultural beliefs preclude asking about Advance Care Planning: No Advanced Directives: Yes Copy Provided: No Do not resuscitate: Yes Copy Provided: No Living Will: Yes Copy Provided: No Medical Power of Attorney: Yes Copy Provided: No Surrogate Decision Maker: Yes Copy Provided: No Electronic Signature(s) Signed: 07/09/2023 11:29:00 AM By: Clinton Nest MD FACS Signed: 07/09/2023 4:36:15 PM By: Merleen Handing RN, BSN Entered By: Merleen [name] on 07/09/2023 06:39:03 [name] ([id number]) [id number] Page 12 of 12 -------------------------------------------------------------------------------- SuperBill Details Patient Name: Date of Service: [name] Clinton RD O. 07/09/2023 Medical Record Number: [id number] Patient Account Number: [id number] Date of Birth/Sex: Treating RN: 05-25-1956 (68 y.o. M) Primary Care Provider: Arloa [name] Other Clinician: Referring Provider: Treating Provider/Extender: Clinton Nest [name] [name] in Treatment: 0 Diagnosis Coding ICD-10 Codes Code Description [phone] Non-pressure  chronic ulcer of other part of right lower leg with fat layer exposed L03.115 Cellulitis of right lower limb I87.2 Venous insufficiency (chronic) (peripheral) I50.32 Chronic diastolic (congestive) heart failure Facility Procedures : CPT4 Code: 23899861 Description: 99213 - WOUND CARE VISIT-LEV 3 EST PT Modifier: 25 Quantity: 1 : CPT4 Code: 23899873 Description: 97597 - DEBRIDE WOUND 1ST 20 SQ CM OR < ICD-10 Diagnosis Description L97.812 Non-pressure chronic ulcer of other part of right lower leg with fat layer expose Modifier: d Quantity: 1 : CPT4 Code: 23899872 Description: 97598 - DEBRIDE WOUND EA ADDL 20 SQ CM ICD-10 Diagnosis Description L97.812 Non-pressure chronic ulcer of other part of right lower leg with fat layer expose Modifier: d Quantity: 7 Physician Procedures : CPT4 Code Description Modifier [id number] 99214 - WC PHYS LEVEL 4 - EST PT 25 ICD-10 Diagnosis Description L97.812  Non-pressure chronic ulcer of other part of right lower leg with fat layer exposed L03.115 Cellulitis of right lower limb I87.2 Venous  insufficiency (chronic) (peripheral) I50.32 Chronic diastolic (congestive) heart failure Quantity: 1 : [id number] 97597 - WC PHYS DEBR WO ANESTH 20 SQ CM ICD-10 Diagnosis Description L97.812 Non-pressure chronic ulcer of other part of right lower leg with fat layer exposed Quantity: 1 : [id number] 97598 - WC PHYS DEBR WO ANESTH EA ADD 20 CM ICD-10 Diagnosis Description L97.812 Non-pressure chronic ulcer of other part of right lower leg with fat layer exposed Quantity: 7 Electronic Signature(s) Signed: 07/09/2023 11:29:00 AM By: Clinton Nest MD FACS Signed: 07/09/2023 4:36:15 PM By: Merleen Handing RN, BSN Previous Signature: 07/09/2023 10:37:31 AM Version By: Clinton Nest MD FACS Entered By: Merleen [name] on 07/09/2023 07:40:13

## 2023-07-09 NOTE — Progress Notes (Addendum)
 FIRAS, GUARDADO ([id number]) [id number] Page 1 of 9 Visit Report for 07/09/2023 Allergy List Details Patient Name: Date of Service: Clinton MARYANNE LOISE SAMUEL RD O. 07/09/2023 9:00 A M Medical Record Number: [id number] Patient Account Number: [id number] Date of Birth/Sex: Treating RN: 12/29/1955 (68 y.o. NETTY Merleen Handing Primary Care Dashay Giesler: Arloa Fallow Other Clinician: Referring Yarden Manuelito: Treating Bettejane Leavens/Extender: Marolyn Delon Arloa Fallow [name] in Treatment: 0 Allergies Active Allergies hydrocodone  Reaction: rash Severity: Moderate baclofen doxycycline  HCl gabapentin  pregabalin  Allergy Notes Electronic Signature(s) Signed: 07/09/2023 4:36:15 PM By: Boehlein, Linda RN, BSN Entered By: Boehlein, Linda on 07/09/2023 06:31:28 -------------------------------------------------------------------------------- Arrival Information Details Patient Name: Date of Service: Clinton MARYANNE LOISE SAMUEL RD O. 07/09/2023 9:00 A M Medical Record Number: [id number] Patient Account Number: [id number] Date of Birth/Sex: Treating RN: 1955/09/11 (68 y.o. M) Primary Care Jaevion Goto: Arloa Fallow Other Clinician: Referring Connell Bognar: Treating Iver Miklas/Extender: Marolyn Delon Arloa Fallow [name] in Treatment: 0 Visit Information Patient Arrived: Vannie Client Time: 09:18 Accompanied By: wife Transfer Assistance: None Patient Identification Verified: Yes Secondary Verification Process Completed: Yes History Since Last Visit Added or deleted any medications: No Any new allergies or adverse reactions: No Had a fall or experienced change in activities of daily living that may affect risk of falls: No Signs or symptoms of abuse/neglect since last visito No Hospitalized since last visit: No Implantable device outside of the clinic excluding cellular tissue based products placed in the center since last visit: No Has Dressing in Place as Prescribed: Yes Electronic  Signature(s) Signed: 07/09/2023 4:36:15 PM By: Merleen Handing RN, BSN [place], [place] O [phone]: Merleen Handing RN, BSN [phone].pdf Page 2 of 9 Signed: 07/09/2023 4:36:15 PM Previous Signature: 07/09/2023 9:24:06 AM Version By: Okey Bonier Entered By: Boehlein, Linda on 07/09/2023 06:29:12 -------------------------------------------------------------------------------- Clinic Level of Care Assessment Details Patient Name: Date of Service: Clinton MARYANNE LOISE SAMUEL RD O. 07/09/2023 9:00 A M Medical Record Number: [id number] Patient Account Number: [id number] Date of Birth/Sex: Treating RN: [birth year] (68 y.o. NETTY Merleen Handing Primary Care Maryam Feely: Arloa Fallow Other Clinician: Referring Berton Butrick: Treating Christionna Poland/Extender: Marolyn Delon Arloa Fallow [name] in Treatment: 0 Clinic Level of Care Assessment Items TOOL 1 Quantity Score []  - 0 Use when EandM and Procedure is performed on INITIAL visit ASSESSMENTS - Nursing Assessment / Reassessment X- 1 20 General Physical Exam (combine w/ comprehensive assessment (listed just below) when performed on new pt. evals) X- 1 25 Comprehensive Assessment (HX, ROS, Risk Assessments, Wounds Hx, etc.) ASSESSMENTS - Wound and Skin Assessment / Reassessment []  - 0 Dermatologic / Skin Assessment (not related to wound area) ASSESSMENTS - Ostomy and/or Continence Assessment and Care []  - 0 Incontinence Assessment and Management []  - 0 Ostomy Care Assessment and Management (repouching, etc.) PROCESS - Coordination of Care []  - 0 Simple Patient / Family Education for ongoing care X- 1 20 Complex (extensive) Patient / Family Education for ongoing care X- 1 10 Staff obtains Chiropractor, Records, T Results / Process Orders est []  - 0 Staff telephones HHA, Nursing Homes / Clarify orders / etc []  - 0 Routine Transfer to another Facility (non-emergent condition) []  - 0 Routine Hospital Admission (non-emergent  condition) X- 1 15 New Admissions / Manufacturing Engineer / Ordering NPWT Apligraf, etc. , []  - 0 Emergency Hospital Admission (emergent condition) PROCESS - Special Needs []  - 0 Pediatric / Minor Patient Management []  - 0 Isolation Patient Management []  - 0 Hearing / Language / Visual special needs []  - 0 Assessment of Community assistance (transportation, D/C planning, etc.) []  -  0 Additional assistance / Altered mentation []  - 0 Support Surface(s) Assessment (bed, cushion, seat, etc.) INTERVENTIONS - Miscellaneous []  - 0 External ear exam []  - 0 Patient Transfer (multiple staff / Nurse, Adult / Similar devices) []  - 0 Simple Staple / Suture removal (25 or less) []  - 0 Complex Staple / Suture removal (26 or more) []  - 0 Hypo/Hyperglycemic Management (do not check if billed separately) []  - 0 Ankle / Brachial Index (ABI) - do not check if billed separately [name], [name] ([id number]) [phone].pdf Page 3 of 9 Has the patient been seen at the hospital within the last three years: Yes Total Score: 90 Level Of Care: New/Established - Level 3 Electronic Signature(s) Signed: 07/09/2023 4:36:15 PM By: Merleen Handing RN, BSN Entered By: Merleen Handing on 07/09/2023 07:39:50 -------------------------------------------------------------------------------- Encounter Discharge Information Details Patient Name: Date of Service: Clinton MARYANNE LOISE SAMUEL RD O. 07/09/2023 9:00 A M Medical Record Number: [id number] Patient Account Number: [id number] Date of Birth/Sex: Treating RN: [birth year] (68 y.o. NETTY Merleen Handing Primary Care Katilin Raynes: Arloa Fallow Other Clinician: Referring Terrionna Bridwell: Treating Jeray Shugart/Extender: Marolyn Delon Arloa Fallow [name] in Treatment: 0 Encounter Discharge Information Items Post Procedure Vitals Discharge Condition: Stable Temperature (F): 99.7 Ambulatory Status: Walker Pulse (bpm): 80 Discharge Destination:  Home Respiratory Rate (breaths/min): 22 Transportation: Private Auto Blood Pressure (mmHg): 101/65 Accompanied By: spouse Schedule Follow-up Appointment: Yes Clinical Summary of Care: Patient Declined Electronic Signature(s) Signed: 07/09/2023 4:36:15 PM By: Merleen Handing RN, BSN Entered By: Merleen Handing on 07/09/2023 07:41:46 -------------------------------------------------------------------------------- Lower Extremity Assessment Details Patient Name: Date of Service: Clinton MARYANNE LOISE SAMUEL RD O. 07/09/2023 9:00 A M Medical Record Number: [id number] Patient Account Number: [id number] Date of Birth/Sex: Treating RN: 11/30/1955 (68 y.o. NETTY Merleen Handing Primary Care Adraine Biffle: Arloa Fallow Other Clinician: Referring Viren Lebeau: Treating Elma Limas/Extender: Marolyn Delon Arloa Fallow [name] in Treatment: 0 Edema Assessment Assessed: [Left: No] [Right: No] Edema: [Left: Ye] [Right: s] Calf Left: Right: Point of Measurement: From Medial Instep 53.5 cm Ankle Left: Right: Point of Measurement: From Medial Instep 28.5 cm Knee To Floor Left: Right: From Medial Instep 48 cm Vascular Assessment Left: [[id number] Page 4 of 9Right:] Pulses: Dorsalis Pedis Palpable: [phone].pdf Page 4 of 9Yes] Extremity colors, hair growth, and conditions: Extremity Color: [phone].pdf Page 4 of 9Red] Hair Growth on Extremity: [phone].pdf Page 4 of 9No] Temperature of Extremity: [phone].pdf Page 4 of 9Warm] Capillary Refill: [phone].pdf Page 4 of 9< 3 seconds] Dependent Rubor: [phone].pdf Page 4 of 9No] Blanched when Elevated: [phone].pdf Page 4 of 9No No] Toe Nail Assessment Left: Right: Thick: Yes Discolored: Yes Deformed: Yes Improper Length and Hygiene: Yes Electronic  Signature(s) Signed: 07/09/2023 4:36:15 PM By: Merleen Handing RN, BSN Entered By: Merleen Handing on 07/09/2023 06:48:40 -------------------------------------------------------------------------------- Multi Wound Chart Details Patient Name: Date of Service: Clinton MARYANNE LOISE SAMUEL RD O. 07/09/2023 9:00 A M Medical Record Number: [id number] Patient Account Number: [id number] Date of Birth/Sex: Treating RN: [birth year] (68 y.o. M) Primary Care Ivalee Strauser: Arloa Fallow Other Clinician: Referring Alexes Lamarque: Treating Nyquan Selbe/Extender: Marolyn Delon Arloa Fallow [name] in Treatment: 0 Vital Signs Height(in): 75 Pulse(bpm): 80 Weight(lbs): 302 Blood Pressure(mmHg): 101/65 Body Mass Index(BMI): 37.7 Temperature(F): 99.7 Respiratory Rate(breaths/min): 22 [8:Photos: No Photos Right, Circumferential Lower Leg Wound Location: Gradually Appeared Wounding Event: Venous Leg Ulcer Primary Etiology: Cataracts, Lymphedema, Sleep Comorbid History: Apnea, Congestive Heart Failure, Coronary Artery Disease, Hypertension,  Neuropathy 06/26/2023 Date Acquired: 0 Weeks of Treatment: Open Wound Status: No Wound Recurrence: 22x37x0.1 Measurements L x W x D (cm) 639.314  A (cm) : rea 63.931 Volume (cm) : Full Thickness Without Exposed Classification: Support Structures Medium  Exudate A mount: Serous Exudate Type: amber Exudate Color: Indistinct, nonvisible Wound Margin: Small (1-33%) Granulation Amount: Red Granulation Quality: Large (67-100%) Necrotic Amount: Eschar, Adherent Slough Necrotic Tissue: Fat Layer (Subcutaneous  Tissue): Yes N/A Exposed Structures: Fascia: No] [N/A:N/A N/A N/A N/A N/A N/A N/A N/A N/A N/A N/A N/A N/A N/A N/A N/A N/A N/A N/A N/A N/A] [name], [name] ([id number]) [8:Tendon: No Muscle: No Joint: No Bone: No Medium (34-66%) Epithelialization: Debridement - Selective/Open Wound N/A Debridement: Pre-procedure Verification/Time Out 10:00 Taken: Lidocaine  4% Topical Solution Pain Control:  Necrotic/Eschar, Slough Tissue  Debrided: Skin/Epidermis Level: 159.75 Debridement A (sq cm): rea Curette Instrument: Minimum Bleeding: Pressure Hemostasis A chieved: Procedure was tolerated well Debridement Treatment Response: 22x37x31 Post Debridement Measurements L x W x D (cm)  80181.25 Post Debridement Volume: (cm) No Abnormalities Noted Periwound Skin Texture: Dry/Scaly: Yes Periwound Skin Moisture: Erythema: Yes Periwound Skin Color: Hemosiderin Staining: Yes Circumferential Erythema Location: Hot Temperature: Yes Tenderness  on Palpation: Debridement Procedures Performed:] [N/A:N/A N/A N/A N/A N/A N/A N/A N/A N/A N/A N/A N/A N/A N/A N/A N/A N/A N/A N/A] Treatment Notes Electronic Signature(s) Signed: 07/09/2023 10:21:22 AM By: Marolyn Nest MD FACS Entered By: Marolyn Nest on 07/09/2023 07:21:21 -------------------------------------------------------------------------------- Multi-Disciplinary Care Plan Details Patient Name: Date of Service: Clinton MARYANNE LOISE SAMUEL RD O. 07/09/2023 9:00 A M Medical Record Number: [id number] Patient Account Number: [id number] Date of Birth/Sex: Treating RN: 1956-06-24 (68 y.o. NETTY Merleen Handing Primary Care Secret Kristensen: Arloa Fallow Other Clinician: Referring Rissa Turley: Treating Najir Roop/Extender: Marolyn Nest Arloa Fallow [name] in Treatment: 0 Multidisciplinary Care Plan reviewed with physician Active Inactive Soft Tissue Infection Nursing Diagnoses: Impaired tissue integrity Knowledge deficit related to disease process and management Goals: Patient's soft tissue infection will resolve Date Initiated: 07/09/2023 Target Resolution Date: 08/06/2023 Goal Status: Active Interventions: Assess signs and symptoms of infection every visit Provide education on infection Treatment Activities: Systemic antibiotics : 07/09/2023 Notes: Venous Leg Ulcer [name], [name] ([id number]) [phone].pdf Page 6 of 9 Nursing  Diagnoses: Actual venous Insuffiency (use after diagnosis is confirmed) Knowledge deficit related to disease process and management Goals: Patient will maintain optimal edema control Date Initiated: 07/09/2023 Target Resolution Date: 08/06/2023 Goal Status: Active Interventions: Assess peripheral edema status every visit. Compression as ordered Treatment Activities: Therapeutic compression applied : 07/09/2023 Notes: Electronic Signature(s) Signed: 07/09/2023 4:36:15 PM By: Merleen Handing RN, BSN Entered By: Boehlein, Linda on 07/09/2023 07:38:54 -------------------------------------------------------------------------------- Pain Assessment Details Patient Name: Date of Service: Clinton MARYANNE LOISE SAMUEL RD O. 07/09/2023 9:00 A M Medical Record Number: [id number] Patient Account Number: [id number] Date of Birth/Sex: Treating RN: 05-10-1956 (68 y.o. M) Primary Care Antony Sian: Arloa Fallow Other Clinician: Referring Iviana Blasingame: Treating Floride Hutmacher/Extender: Marolyn Nest Arloa Fallow [name] in Treatment: 0 Active Problems Location of Pain Severity and Description of Pain Patient Has Paino Yes Site Locations Rate the pain. Current Pain Level: 8 Worst Pain Level: 10 Least Pain Level: 0 Tolerable Pain Level: 2 Character of Pain Describe the Pain: Burning, Sharp Pain Management and Medication Current Pain Management: Electronic Signature(s) Signed: 07/09/2023 9:24:06 AM By: Okey Bonier Entered By: Okey Bonier on 07/09/2023 06:20:53 [name] ([id number]) [id number].pdf Page 7 of 9 -------------------------------------------------------------------------------- Patient/Caregiver Education Details Patient Name: Date of Service: [facility] MARYANNE LOISE SAMUEL RD O. 1/2/2025andnbsp9:00 A M Medical Record Number: [id number] Patient Account Number: [id number] Date of Birth/Gender: Treating RN: 1956-06-03 (68 y.o. NETTY Merleen Handing Primary Care Physician: Arloa,  Elsie  Other Clinician: Referring Physician: Treating Physician/Extender: Marolyn Delon Arloa Elsie [name] in Treatment: 0 Education Assessment Education Provided To: Patient Education Topics Provided Infection: Methods: Explain/Verbal Responses: Reinforcements needed, State content correctly Venous: Methods: Explain/Verbal Responses: Reinforcements needed, State content correctly Electronic Signature(s) Signed: 07/09/2023 4:36:15 PM By: Merleen Handing RN, BSN Entered By: Merleen Handing on 07/09/2023 07:39:12 -------------------------------------------------------------------------------- Wound Assessment Details Patient Name: Date of Service: Clinton MARYANNE LOISE SAMUEL RD O. 07/09/2023 9:00 A M Medical Record Number: [id number] Patient Account Number: [id number] Date of Birth/Sex: Treating RN: 01/26/1956 (68 y.o. NETTY Merleen Handing Primary Care Merry Pond: Arloa Elsie Other Clinician: Referring Pattricia Weiher: Treating Amiyrah Lamere/Extender: Marolyn Delon Arloa Elsie [name] in Treatment: 0 Wound Status Wound Number: 8 Primary Venous Leg Ulcer Etiology: Wound Location: Right, Circumferential Lower Leg Wound Open Wounding Event: Gradually Appeared Status: Date Acquired: 06/26/2023 Comorbid Cataracts, Lymphedema, Sleep Apnea, Congestive Heart Failure, Weeks Of Treatment: 0 History: Coronary Artery Disease, Hypertension, Neuropathy Clustered Wound: No Photos [name], [name] ([id number]) [phone].pdf Page 8 of 9 Wound Measurements Length: (cm) 22 Width: (cm) 37 Depth: (cm) 0.1 Area: (cm) 639.314 Volume: (cm) 63.931 % Reduction in Area: % Reduction in Volume: Epithelialization: Medium (34-66%) Tunneling: No Undermining: No Wound Description Classification: Full Thickness Without Exposed Support Wound Margin: Indistinct, nonvisible Exudate Amount: Medium Exudate Type: Serous Exudate Color: amber Structures Foul Odor After Cleansing: No Slough/Fibrino  Yes Wound Bed Granulation Amount: Small (1-33%) Exposed Structure Granulation Quality: Red Fascia Exposed: No Necrotic Amount: Large (67-100%) Fat Layer (Subcutaneous Tissue) Exposed: Yes Necrotic Quality: Eschar, Adherent Slough Tendon Exposed: No Muscle Exposed: No Joint Exposed: No Bone Exposed: No Periwound Skin Texture Texture Color No Abnormalities Noted: Yes No Abnormalities Noted: No Erythema: Yes Moisture Erythema Location: Circumferential No Abnormalities Noted: No Hemosiderin Staining: Yes Dry / Scaly: Yes Temperature / Pain Temperature: Hot Tenderness on Palpation: Yes Treatment Notes Wound #8 (Lower Leg) Wound Laterality: Right, Circumferential Cleanser Peri-Wound Care Topical Mupirocin Ointment Discharge Instruction: Apply Mupirocin (Bactroban) as instructed Primary Dressing PolyMem Silver Non-Adhesive Dressing, 4.25x4.25 in Discharge Instruction: Apply to wound bed as instructed Secondary Dressing ABD Pad, 8x10 Discharge Instruction: Apply over primary dressing as directed. Secured With Compression Wrap Kerlix Roll 4.5x3.1 (in/yd) Discharge Instruction: Apply Kerlix and Coban compression as directed. Coban Self-Adherent Wrap 4x5 (in/yd) Discharge Instruction: Apply over Kerlix as directed. [name], [name] ([id number]) [id number] Page 9 of 9 Compression Stockings Add-Ons Electronic Signature(s) Signed: 07/09/2023 4:36:15 PM By: Merleen Handing RN, BSN Entered By: Boehlein, Linda on 07/09/2023 08:02:46 -------------------------------------------------------------------------------- Vitals Details Patient Name: Date of Service: Clinton MARYANNE LOISE SAMUEL RD O. 07/09/2023 9:00 A M Medical Record Number: [id number] Patient Account Number: [id number] Date of Birth/Sex: Treating RN: 03-11-1956 (68 y.o. M) Primary Care Daylynn Stumpp: Arloa Elsie Other Clinician: Referring Donyea Beverlin: Treating Gordana Kewley/Extender: Marolyn Delon Arloa Elsie [name] in Treatment: 0 Vital Signs Time Taken: 09:19 Temperature (F): 99.7 Height (in): 75 Pulse (bpm): 80 Weight (lbs): 302 Respiratory Rate (breaths/min): 22 Body Mass Index (BMI): 37.7 Blood Pressure (mmHg): 101/65 Reference Range: 80 - 120 mg / dl Electronic Signature(s) Signed: 07/09/2023 4:36:15 PM By: Merleen Handing RN, BSN Previous Signature: 07/09/2023 9:24:06 AM Version By: Okey Bonier Entered By: Merleen Handing on 07/09/2023 06:30:23

## 2023-07-10 NOTE — Progress Notes (Signed)
 ASHLEIGH, ARYA ([id number]) [id number] Page 1 of 4 Visit Report for 07/09/2023 Abuse Risk Screen Details Patient Name: Date of Service: Clinton MARYANNE LOISE [address] RD O. 07/09/2023 9:00 A M Medical Record Number: [id number] Patient Account Number: [id number] Date of Birth/Sex: Treating RN: 22-Nov-1955 (67 y.o. NETTY Merleen Handing Primary Care Krystina Strieter: Arloa Fallow Other Clinician: Referring Torah Pinnock: Treating Avian Konigsberg/Extender: Marolyn Delon Arloa Fallow [name] in Treatment: 0 Abuse Risk Screen Items Answer ABUSE RISK SCREEN: Has anyone close to you tried to hurt or harm you recentlyo No Do you feel uncomfortable with anyone in your familyo No Has anyone forced you do things that you didnt want to doo No Electronic Signature(s) Signed: 07/09/2023 4:36:15 PM By: Merleen Handing RN, BSN Entered By: Merleen Handing on 07/09/2023 09:39:12 -------------------------------------------------------------------------------- Activities of Daily Living Details Patient Name: Date of Service: [facility] RD O. 07/09/2023 9:00 A M Medical Record Number: [id number] Patient Account Number: [id number] Date of Birth/Sex: Treating RN: 07-17-1955 (68 y.o. NETTY Merleen Handing Primary Care Ronin Rehfeldt: Arloa Fallow Other Clinician: Referring Masud Holub: Treating Kalee Broxton/Extender: Marolyn Delon Arloa Fallow [name] in Treatment: 0 Activities of Daily Living Items Answer Activities of Daily Living (Please select one for each item) Drive Automobile Completely Able T Medications ake Need Assistance Use T elephone Completely Able Care for Appearance Completely Able Use T oilet Completely Able Bath / Shower Completely Able Dress Self Completely Able Feed Self Completely Able Walk Need Assistance Get In / Out Bed Completely Able Housework Need Assistance Prepare Meals Completely Able Handle Money Need Assistance Shop for Self Need Assistance Electronic  Signature(s) Signed: 07/09/2023 4:36:15 PM By: Boehlein, Linda RN, BSN Entered By: Merleen Handing on 07/09/2023 09:43:38 [name] ([id number]) [phone] [id number] Page 2 of 4 -------------------------------------------------------------------------------- Education Screening Details Patient Name: Date of Service: Clinton MARYANNE LOISE SAMUEL RD O. 07/09/2023 9:00 A M Medical Record Number: [id number] Patient Account Number: [id number] Date of Birth/Sex: Treating RN: 1955/11/11 (68 y.o. NETTY Merleen Handing Primary Care Novi Calia: Arloa Fallow Other Clinician: Referring Nyair Depaulo: Treating Jovonna Nickell/Extender: Marolyn Delon Arloa Fallow [name] in Treatment: 0 Primary Learner Assessed: Patient Learning Preferences/Education Level/Primary Language Learning Preference: Explanation, Demonstration, Video, Printed Material Highest Education Level: College or Above Preferred Language: English Cognitive Barrier Language Barrier: No Translator Needed: No Memory Deficit: No Emotional Barrier: No Cultural/Religious Beliefs Affecting Medical Care: No Physical Barrier Impaired Vision: No Impaired Hearing: No Decreased Hand dexterity: No Knowledge/Comprehension Knowledge Level: High Comprehension Level: High Ability to understand written instructions: High Ability to understand verbal instructions: High Motivation Anxiety Level: Calm Cooperation: Cooperative Education Importance: Acknowledges Need Interest in Health Problems: Asks Questions Perception: Coherent Willingness to Engage in Self-Management High Activities: Readiness to Engage in Self-Management High Activities: Electronic Signature(s) Signed: 07/09/2023 4:36:15 PM By: Merleen Handing RN, BSN Entered By: Merleen Handing on 07/09/2023 09:44:17 -------------------------------------------------------------------------------- Fall Risk Assessment Details Patient Name: Date of Service: Clinton MARYANNE LOISE SAMUEL RD  O. 07/09/2023 9:00 A M Medical Record Number: [id number] Patient Account Number: [id number] Date of Birth/Sex: Treating RN: 28-Sep-1955 (67 y.o. NETTY Merleen Handing Primary Care Luisfelipe Engelstad: Arloa Fallow Other Clinician: Referring Lynnett Langlinais: Treating Srihari Shellhammer/Extender: Marolyn Delon Arloa Fallow [name] in Treatment: 0 Fall Risk Assessment Items Have you had 2 or more falls in the last [address] [name], [name] ([id number]) [phone] [id number] Page 3 of 4 Have you had any fall that resulted in injury in the last 12 monthso 0 No FALLS RISK SCREEN History of falling - immediate or within 3 months 25 Yes Secondary  diagnosis (Do you have 2 or more medical diagnoseso) 0 No Ambulatory aid None/bed rest/wheelchair/nurse 0 No Crutches/cane/walker 15 Yes Furniture 0 No Intravenous therapy Access/Saline/Heparin  Lock 0 No Gait/Transferring Normal/ bed rest/ wheelchair 0 No Weak (short steps with or without shuffle, stooped but able to lift head while walking, may seek 10 Yes support from furniture) Impaired (short steps with shuffle, may have difficulty arising from chair, head down, impaired 0 No balance) Mental Status Oriented to own ability 0 Yes Electronic Signature(s) Signed: 07/09/2023 4:36:15 PM By: Merleen Handing RN, BSN Entered By: Merleen Handing on 07/09/2023 09:44:45 -------------------------------------------------------------------------------- Foot Assessment Details Patient Name: Date of Service: Clinton MARYANNE LOISE SAMUEL RD O. 07/09/2023 9:00 A M Medical Record Number: [id number] Patient Account Number: [id number] Date of Birth/Sex: Treating RN: [birth year] (68 y.o. NETTY Merleen Handing Primary Care Lexianna Weinrich: Arloa Fallow Other Clinician: Referring Zahriyah Joo: Treating Nancye Grumbine/Extender: Marolyn Delon Arloa Fallow [name] in Treatment: 0 Foot Assessment Items Site Locations + = Sensation present, - = Sensation absent, C = Callus, U = Ulcer R  = Redness, W = Warmth, M = Maceration, PU = Pre-ulcerative lesion F = Fissure, S = Swelling, D = Dryness Assessment Right: Left: Other Deformity: No No Prior Foot Ulcer: No No Prior Amputation: No No Charcot Joint: No No Ambulatory Status: Ambulatory With Help Assistance Device: [name], [name] ([id number]) [phone] [id number] Page 4 of 4 Gait: Steady Electronic Signature(s) Signed: 07/09/2023 4:36:15 PM By: Merleen Handing RN, BSN Entered By: Boehlein, Linda on 07/09/2023 09:45:21 -------------------------------------------------------------------------------- Nutrition Risk Screening Details Patient Name: Date of Service: [facility] RD O. 07/09/2023 9:00 A M Medical Record Number: [id number] Patient Account Number: [id number] Date of Birth/Sex: Treating RN: [birth year] (68 y.o. NETTY Merleen Handing Primary Care Hartman Minahan: Arloa Fallow Other Clinician: Referring Arleen Bar: Treating Robbie Rideaux/Extender: Marolyn Delon Arloa Fallow [name] in Treatment: 0 Height (in): 75 Weight (lbs): 302 Body Mass Index (BMI): 37.7 Nutrition Risk Screening Items Score Screening NUTRITION RISK SCREEN: I have an illness or condition that made me change the kind and/or amount of food I eat 0 No I eat fewer than two meals per day 0 No I eat few fruits and vegetables, or milk products 0 No I have three or more drinks of beer, liquor or wine almost every day 0 No I have tooth or mouth problems that make it hard for me to eat 0 No I don't always have enough money to buy the food I need 0 No I eat alone most of the time 0 No I take three or more different prescribed or over-the-counter drugs a day 1 Yes Without wanting to, I have lost or gained 10 pounds in the last six months 0 No I am not always physically able to shop, cook and/or feed myself 0 No Nutrition Protocols Good Risk Protocol 0 No interventions needed Moderate Risk Protocol High Risk  Proctocol Risk Level: Good Risk Score: 1 Electronic Signature(s) Signed: 07/09/2023 4:36:15 PM By: Merleen Handing RN, BSN Entered By: Merleen Handing on 07/09/2023 09:45:05

## 2023-07-13 ENCOUNTER — Emergency Department (HOSPITAL_COMMUNITY)
Admission: EM | Admit: 2023-07-13 | Discharge: 2023-07-13 | Discharge status: Against Medical Advice | Payer: Medicare Other | Source: Home / Self Care

## 2023-07-13 ENCOUNTER — Encounter (HOSPITAL_BASED_OUTPATIENT_CLINIC_OR_DEPARTMENT_OTHER): Payer: Medicare Other | Admitting: General Surgery

## 2023-07-13 DIAGNOSIS — I872 Venous insufficiency (chronic) (peripheral): Secondary | ICD-10-CM | POA: Diagnosis not present

## 2023-07-13 NOTE — Progress Notes (Signed)
 [name], [name] ([id number]) [id number] Page 1 of 8 Visit Report for 07/13/2023 Arrival Information Details Patient Name: Date of Service: [name] SAMUEL RD O. 07/13/2023 12:30 PM Medical Record Number: [id number] Patient Account Number: [id number] Date of Birth/Sex: Treating RN: [birth year] (68 y.o. [name] Primary Care Kimala Horne: Arloa Fallow Other Clinician: Referring Celenia Hruska: Treating Zareya Tuckett/Extender: Marolyn Delon Arloa Fallow [name] in Treatment: 0 Visit Information History Since Last Visit Has Dressing in Place as Prescribed: Yes Patient Arrived: Walker Has Compression in Place as Prescribed: Yes Arrival Time: 12:34 Pain Present Now: Yes Accompanied By: spouse Transfer Assistance: None Patient Identification Verified: Yes Secondary Verification Process Completed: Yes Patient Requires Transmission-Based Precautions: No Patient Has Alerts: No Electronic Signature(s) Signed: 07/13/2023 3:55:02 PM By: Merleen Handing RN, BSN Entered By: Boehlein, Linda on 07/13/2023 09:35:00 -------------------------------------------------------------------------------- Clinic Level of Care Assessment Details Patient Name: Date of Service: [facility] RD O. 07/13/2023 12:30 PM Medical Record Number: [id number] Patient Account Number: [id number] Date of Birth/Sex: Treating RN: October 23, 1955 (68 y.o. [name] Primary Care Deloyce Walthers: Arloa Fallow Other Clinician: Referring Haide Klinker: Treating Janecia Palau/Extender: Marolyn Delon Arloa Fallow [name] in Treatment: 0 Clinic Level of Care Assessment Items TOOL 4 Quantity Score []  - 0 Use when only an EandM is performed on FOLLOW-UP visit ASSESSMENTS - Nursing Assessment / Reassessment X- 1 10 Reassessment of Co-morbidities (includes updates in patient status) X- 1 5 Reassessment of Adherence to Treatment Plan ASSESSMENTS - Wound and Skin A ssessment / Reassessment X - Simple Wound  Assessment / Reassessment - one wound 1 5 []  - 0 Complex Wound Assessment / Reassessment - multiple wounds []  - 0 Dermatologic / Skin Assessment (not related to wound area) ASSESSMENTS - Focused Assessment []  - 0 Circumferential Edema Measurements - multi extremities []  - 0 Nutritional Assessment / Counseling / Intervention []  - 0 Lower Extremity Assessment (monofilament, tuning fork, pulses) []  - 0 Peripheral Arterial Disease Assessment (using hand held doppler) ASSESSMENTS - Ostomy and/or Continence Assessment and Care [name] ([id number]) [id number] Page 2 of 8 []  - 0 Incontinence Assessment and Management []  - 0 Ostomy Care Assessment and Management (repouching, etc.) PROCESS - Coordination of Care X - Simple Patient / Family Education for ongoing care 1 15 []  - 0 Complex (extensive) Patient / Family Education for ongoing care X- 1 10 Staff obtains Consents, Records, T Results / Process Orders est []  - 0 Staff telephones HHA, Nursing Homes / Clarify orders / etc []  - 0 Routine Transfer to another Facility (non-emergent condition) X- 1 10 Routine Hospital Admission (non-emergent condition) []  - 0 New Admissions / Manufacturing Engineer / Ordering NPWT Apligraf, etc. , []  - 0 Emergency Hospital Admission (emergent condition) X- 1 10 Simple Discharge Coordination []  - 0 Complex (extensive) Discharge Coordination PROCESS - Special Needs []  - 0 Pediatric / Minor Patient Management []  - 0 Isolation Patient Management []  - 0 Hearing / Language / Visual special needs []  - 0 Assessment of Community assistance (transportation, D/C planning, etc.) []  - 0 Additional assistance / Altered mentation []  - 0 Support Surface(s) Assessment (bed, cushion, seat, etc.) INTERVENTIONS - Wound Cleansing / Measurement X - Simple Wound Cleansing - one wound 1 5 []  - 0 Complex Wound Cleansing - multiple wounds []  - 0 Wound Imaging  (photographs - any number of wounds) []  - 0 Wound Tracing (instead of photographs) []  - 0 Simple Wound Measurement - one wound []  - 0 Complex Wound Measurement - multiple wounds INTERVENTIONS -  Wound Dressings []  - 0 Small Wound Dressing one or multiple wounds X- 1 15 Medium Wound Dressing one or multiple wounds []  - 0 Large Wound Dressing one or multiple wounds []  - 0 Application of Medications - topical []  - 0 Application of Medications - injection INTERVENTIONS - Miscellaneous []  - 0 External ear exam []  - 0 Specimen Collection (cultures, biopsies, blood, body fluids, etc.) []  - 0 Specimen(s) / Culture(s) sent or taken to Lab for analysis []  - 0 Patient Transfer (multiple staff / Nurse, Adult / Similar devices) []  - 0 Simple Staple / Suture removal (25 or less) []  - 0 Complex Staple / Suture removal (26 or more) []  - 0 Hypo / Hyperglycemic Management (close monitor of Blood Glucose) []  - 0 Ankle / Brachial Index (ABI) - do not check if billed separately X- 1 5 Vital Signs Has the patient been seen at the hospital within the last three years: Yes Total Score: 90 Level Of Care: New/Established - [name] ([id number]) [id number] Page 3 of 8 Electronic Signature(s) Signed: 07/13/2023 3:55:02 PM By: Merleen Handing RN, BSN Entered By: Boehlein, Linda on 07/13/2023 10:04:12 -------------------------------------------------------------------------------- Encounter Discharge Information Details Patient Name: Date of Service: [name] SAMUEL RD O. 07/13/2023 12:30 PM Medical Record Number: [id number] Patient Account Number: [id number] Date of Birth/Sex: Treating RN: 05-14-1956 (68 y.o. [name] Primary Care Asani Mcburney: Arloa Fallow Other Clinician: Referring Osamu Olguin: Treating Adelfo Diebel/Extender: Marolyn Delon Arloa Fallow [name] in Treatment: 0 Encounter Discharge Information Items Discharge Condition:  Stable Ambulatory Status: Walker Discharge Destination: Emergency Room Telephoned: No Orders Sent: Yes Transportation: Private Auto Accompanied By: spouse Schedule Follow-up Appointment: Yes Clinical Summary of Care: Patient Declined Electronic Signature(s) Signed: 07/13/2023 3:55:02 PM By: Merleen Handing RN, BSN Entered By: Merleen [name] on 07/13/2023 10:05:35 -------------------------------------------------------------------------------- Lower Extremity Assessment Details Patient Name: Date of Service: [facility] RD O. 07/13/2023 12:30 PM Medical Record Number: [id number] Patient Account Number: [id number] Date of Birth/Sex: Treating RN: 03-08-1956 (68 y.o. [name] Primary Care Jianna Drabik: Arloa Fallow Other Clinician: Referring Ryo Klang: Treating Lucetta Baehr/Extender: Marolyn Delon Arloa Fallow [name] in Treatment: 0 Edema Assessment Assessed: [Left: No] [Right: No] Edema: [Left: Ye] [Right: s] Calf Left: Right: Point of Measurement: From Medial Instep 53.5 cm Ankle Left: Right: Point of Measurement: From Medial Instep 28.5 cm Vascular Assessment Extremity colors, hair growth, and conditions: Extremity Color: [Right:Red] Hair Growth on Extremity: [Right:No] JACADEN, FORBUSH ([id number]) [phone].pdf Page 4 of 8] Temperature of Extremity: [Right:Hot] Capillary Refill: [Right:< 3 seconds] Dependent Rubor: [Right:No No] Electronic Signature(s) Signed: 07/13/2023 3:55:02 PM By: Merleen Handing RN, BSN Entered By: Merleen [name] on 07/13/2023 10:01:45 -------------------------------------------------------------------------------- Multi Wound Chart Details Patient Name: Date of Service: [name] SAMUEL RD O. 07/13/2023 12:30 PM Medical Record Number: [id number] Patient Account Number: [id number] Date of Birth/Sex: Treating RN: Feb 07, 1956 (68 y.o. M) Primary Care Kourtnei Rauber: Arloa Fallow Other Clinician: Referring  Meda Dudzinski: Treating Uziah Sorter/Extender: Marolyn Delon Arloa Fallow [name] in Treatment: 0 Vital Signs Height(in): 75 Pulse(bpm): 109 Weight(lbs): 302 Blood Pressure(mmHg): 128/81 Body Mass Index(BMI): 37.7 Temperature(F): 98.2 Respiratory Rate(breaths/min): 22 [Treatment Notes:Wound Assessments Treatment Notes] Electronic Signature(s) Signed: 07/13/2023 12:53:27 PM By: Marolyn Delon MD FACS Entered By: Marolyn Delon on 07/13/2023 09:53:27 -------------------------------------------------------------------------------- Multi-Disciplinary Care Plan Details Patient Name: Date of Service: [name] SAMUEL RD O. 07/13/2023 12:30 PM Medical Record Number: [id number] Patient Account Number: [id number] Date of Birth/Sex: Treating RN: [birth year] (68 y.o. [name] Primary Care Caleel Kiner: Arloa Fallow Other  Clinician: Referring Mykel Sponaugle: Treating Paizlie Klaus/Extender: Marolyn Delon Arloa Elsie [name] in Treatment: 0 Multidisciplinary Care Plan reviewed with physician Active Inactive Soft Tissue Infection Nursing Diagnoses: Impaired tissue integrity Knowledge deficit related to disease process and management Goals: Patient's soft tissue infection will resolve Date Initiated: 07/09/2023 Target Resolution Date: 08/06/2023 Goal Status: Active ELIJAHJAMES, FUELLING ([id number]) [phone].pdf Page 5 of 8 Interventions: Assess signs and symptoms of infection every visit Provide education on infection Treatment Activities: Education provided on Infection : 07/09/2023 Systemic antibiotics : 07/09/2023 Notes: Venous Leg Ulcer Nursing Diagnoses: Actual venous Insuffiency (use after diagnosis is confirmed) Knowledge deficit related to disease process and management Goals: Patient will maintain optimal edema control Date Initiated: 07/09/2023 Target Resolution Date: 08/06/2023 Goal Status: Active Interventions: Assess peripheral edema status every  visit. Compression as ordered Treatment Activities: Therapeutic compression applied : 07/09/2023 Notes: Electronic Signature(s) Signed: 07/13/2023 3:55:02 PM By: Merleen Handing RN, BSN Entered By: Boehlein, Linda on 07/13/2023 10:02:58 -------------------------------------------------------------------------------- Pain Assessment Details Patient Name: Date of Service: [name] SAMUEL RD O. 07/13/2023 12:30 PM Medical Record Number: [id number] Patient Account Number: [id number] Date of Birth/Sex: Treating RN: July 12, 1955 (69 y.o. [name] Primary Care Korey Arroyo: Arloa Elsie Other Clinician: Referring Angalena Cousineau: Treating Dorrine Montone/Extender: Marolyn Delon Arloa Elsie [name] in Treatment: 0 Active Problems Location of Pain Severity and Description of Pain Patient Has Paino Yes Site Locations Pain Location: Generalized Pain, Pain in Ulcers With Dressing Change: Yes Duration of the Pain. Constant / Intermittento Constant Rate the pain. Current Pain Level: 8 Worst Pain Level: 9 Least Pain Level: [address] of Pain ELEANOR, DIMICHELE ([id number]) [id number] Page 6 of 8 Describe the Pain: Aching Pain Management and Medication Current Pain Management: Medication: Yes Is the Current Pain Management Adequate: Inadequate How does your wound impact your activities of daily livingo Sleep: Yes Emotions: Yes Electronic Signature(s) Signed: 07/13/2023 3:55:02 PM By: Merleen Handing RN, BSN Entered By: Merleen [name] on 07/13/2023 10:01:31 -------------------------------------------------------------------------------- Patient/Caregiver Education Details Patient Name: Date of Service: [name] SAMUEL RD O. 1/6/2025andnbsp12:30 PM Medical Record Number: [id number] Patient Account Number: [id number] Date of Birth/Gender: Treating RN: [birth year] (68 y.o. [name] Primary Care Physician: Arloa Elsie Other Clinician: Referring  Physician: Treating Physician/Extender: Marolyn Delon Arloa Elsie [name] in Treatment: 0 Education Assessment Education Provided To: Patient Education Topics Provided Infection: Methods: Explain/Verbal Responses: Reinforcements needed, State content correctly Pain: Methods: Explain/Verbal Responses: Reinforcements needed, State content correctly Electronic Signature(s) Signed: 07/13/2023 3:55:02 PM By: Merleen Handing RN, BSN Entered By: Merleen [name] on 07/13/2023 10:03:25 -------------------------------------------------------------------------------- Wound Assessment Details Patient Name: Date of Service: [name] SAMUEL RD O. 07/13/2023 12:30 PM Medical Record Number: [id number] Patient Account Number: [id number] Date of Birth/Sex: Treating RN: 02/09/1956 (68 y.o. [name] Primary Care Vonnetta Akey: Arloa Elsie Other Clinician: Referring Theora Vankirk: Treating Aribelle Mccosh/Extender: Marolyn Delon Arloa Elsie [name] in Treatment: 0 Wound Status Wound Number: 8 Primary Venous Leg Ulcer Etiology: Wound Location: Right, Circumferential Lower Leg Wound Open Wounding Event: Gradually Appeared Status: Date Acquired: 06/26/2023 SKYLOR, SCHNAPP ([id number]) [id number] Page 7 of 8 Date Acquired: 06/26/2023 Comorbid Cataracts, Lymphedema, Sleep Apnea, Congestive Heart Failure, Weeks Of Treatment: 0 History: Coronary Artery Disease, Hypertension, Neuropathy Clustered Wound: No Wound Measurements Length: (cm) 22 Width: (cm) 37 Depth: (cm) 31 Area: (cm) 639.314 Volume: (cm) 19818.74 % Reduction in Area: 0% % Reduction in Volume: -30900.2% Epithelialization: Medium (34-66%) Tunneling: No Undermining: No Wound Description Classification: Full Thickness Without Exposed Support Wound Margin: Indistinct, nonvisible Exudate Amount: Large Exudate  Type: Serous Exudate Color: amber Structures Foul Odor After Cleansing:  No Slough/Fibrino Yes Wound Bed Granulation Amount: Large (67-100%) Exposed Structure Granulation Quality: Red Fascia Exposed: No Necrotic Amount: Small (1-33%) Fat Layer (Subcutaneous Tissue) Exposed: Yes Necrotic Quality: Adherent Slough Tendon Exposed: No Muscle Exposed: No Joint Exposed: No Bone Exposed: No Periwound Skin Texture Texture Color No Abnormalities Noted: No No Abnormalities Noted: No Excoriation: Yes Erythema: Yes Erythema Location: Circumferential Moisture Hemosiderin Staining: Yes No Abnormalities Noted: No Dry / Scaly: Yes Temperature / Pain Temperature: Hot Tenderness on Palpation: Yes Treatment Notes Wound #8 (Lower Leg) Wound Laterality: Right, Circumferential Cleanser Peri-Wound Care Topical Primary Dressing Maxorb Extra Ag+ Alginate Dressing, 4x4.75 (in/in) Discharge Instruction: Apply to wound bed as instructed Secondary Dressing ABD Pad, 8x10 Discharge Instruction: Apply over primary dressing as directed. Secured With Compression Wrap Kerlix Roll 4.5x3.1 (in/yd) Discharge Instruction: Apply Kerlix and Coban compression as directed. Compression Stockings Add-Ons Notes sent to ED Electronic Signature(s) Signed: 07/13/2023 3:55:02 PM By: Boehlein, Linda RN, BSN Entered By: Boehlein, Linda on 07/13/2023 10:02:20 CLOTILDA CHARLIE KIDD ([id number]) [id number] Page 8 of 8 -------------------------------------------------------------------------------- Vitals Details Patient Name: Date of Service: [name] SAMUEL RD O. 07/13/2023 12:30 PM Medical Record Number: [id number] Patient Account Number: [id number] Date of Birth/Sex: Treating RN: May 29, 1956 (68 y.o. [name] Primary Care Liliyana Thobe: Arloa Fallow Other Clinician: Referring Ramey Ketcherside: Treating Onetta Spainhower/Extender: Marolyn Delon Arloa Fallow [name] in Treatment: 0 Vital Signs Time Taken: 12:46 Temperature (F): 98.2 Height (in): 75 Pulse (bpm):  109 Weight (lbs): 302 Respiratory Rate (breaths/min): 22 Body Mass Index (BMI): 37.7 Blood Pressure (mmHg): 128/81 Reference Range: 80 - 120 mg / dl Electronic Signature(s) Signed: 07/13/2023 3:55:02 PM By: Merleen Handing RN, BSN Entered By: Boehlein, Linda on 07/13/2023 09:47:04

## 2023-07-13 NOTE — Progress Notes (Addendum)
 COLLIS, THEDE ([id number]) [id number] Page 1 of 8 Visit Report for 07/13/2023 Chief Complaint Document Details Patient Name: Date of Service: Clinton MARYANNE LOISE SAMUEL RD O. 07/13/2023 12:30 PM Medical Record Number: [id number] Patient Account Number: [id number] Date of Birth/Sex: Treating RN: 12/26/1955 (68 y.o. M) Primary Care Provider: Arloa Fallow Other Clinician: Referring Provider: Treating Provider/Extender: Marolyn Delon Arloa Fallow [name] in Treatment: 0 Information Obtained from: Patient Chief Complaint Patient presents for treatment of an open ulcer due to venous insufficiency 03/23/2023; patient returns to clinic today for review of wounds and drainage on the right lower leg 07/09/2023: returns with same, cellulitis Electronic Signature(s) Signed: 07/13/2023 12:53:33 PM By: Marolyn Delon MD FACS Entered By: Marolyn Delon on 07/13/2023 09:53:33 -------------------------------------------------------------------------------- HPI Details Patient Name: Date of Service: Clinton MARYANNE LOISE SAMUEL RD O. 07/13/2023 12:30 PM Medical Record Number: [id number] Patient Account Number: [id number] Date of Birth/Sex: Treating RN: May 31, 1956 (68 y.o. M) Primary Care Provider: Arloa Fallow Other Clinician: Referring Provider: Treating Provider/Extender: Marolyn Delon Arloa Fallow [name] in Treatment: 0 History of Present Illness HPI Description: ADMISSION 02/24/2022 This is a 68 year old man with a past medical history significant for congestive heart failure, morbid obesity, dilated cardiomyopathy, atrial fibrillation, and lower extremity cellulitis. He is currently being treated with Keflex . He is not diabetic. He does not smoke. He says that he developed some blisters on his left anterior tibial surface which subsequently broke open causing the wounds for which she is here to see us  today. He says that he occasionally wears compression stockings but does  not do so on a regular basis. He reports that they look stupid with shorts. ABI in clinic today was 0.94. On the left anterior tibial surface, there are several small wounds in a geographic pattern. They do appear consistent with blisters that have ruptured. The fat layer is exposed and there is a thin layer of slough on the surfaces. He does have some surrounding erythema, but no purulent drainage. Edema control is very poor. 03/03/2022: The patient came in for a nurse visit last week because he thought the compression wraps were too tight. They were reapplied and apparently he did not understand the instruction to try to stay off his feet is much as possible and keep his leg elevated; he instead walked excessively on both Friday and Saturday. As result his leg became more swollen, the wrap became uncomfortable and he cut it off yesterday. He replaced it with a wrap of his own fashion. Today he has an indentation in his leg where his homemade wrap ended. He has extensive 3+ nonpitting edema above this. The wounds themselves are in fairly decent condition with just a little bit of slough on the surface, but edema fluid is frankly pouring out of the largest wound. 03/14/2022: Significant improvement in his wound. He has come in a couple of times for nurse visits due to drainage. Today the wound is quite a bit smaller and just has a layer of eschar and slough on the surface. Edema control is better. 03/23/2022: Ongoing improvement in his wound. His edema control is suboptimal today, however, because his wrap slid down. There is also evidence of excoriation suggesting that perhaps he was scratching under the wrap and it slid. There is a little bit of eschar and slough on the wound surface. 03/28/2022: The patient has been fairly noncompliant with his compression wraps. As result, he has opened a new area of wounding on his anterior tibial surface just proximal to the original site.  It is limited breakdown of  skin. The original site is smaller and has a little slough buildup. He did see his cardiologist this week and is now taking torsemide  twice a day. 04/04/2022: The patient continues to be fairly noncompliant with his care recommendations. He fails to show up for his venous reflux studies on Monday. He continues to cut his wraps off. Although he is supposed to be taking torsemide  twice a day, the degree of edema in his legs suggest that perhaps he is not [name], [name] ([id number]) [id number] Page 2 of 8 being compliant with his medication, as well. His blood pressure was elevated today and he said that he did not take his antihypertensives. He claims that the methocarbamol  and tizanidine  he was prescribed for muscle spasms are responsible for his leg swelling. He has developed a new wound on his right anterior tibial surface. This is fairly small and limited to breakdown of skin. There is a small amount of slough present. The wounds on his left lower leg do not look at all improved. There is slough accumulation on a couple of the deeper spots. He also has an area on his dorsal foot and anterior ankle that look like they are threatening to breakdown; this is where he had cut his wrap back to so that they were rubbing right in this location. 04/14/2022: The right leg has healed and there has been substantial improvement to the wounds on the left. 04/21/2022: The left leg has deteriorated considerably. It is massively edematous and erythematous. The wound is much larger and is pouring fluid. He says that he got it wet in the shower when his cast protector apparently leaked. He says that he had to walk half a mile this morning to his car, as it had been parked some distance away. 05/01/2022: Since our last visit, the patient was admitted to the hospital due to shortness of breath and was found to have severe urinary retention. A Foley catheter was placed and he was diuresed.  He continues to have the indwelling catheter and has an appointment with urology later today. His wound has not really improved at all. His leg is quite edematous with 3+ pitting edema to the mid thigh. The wound is erythematous and the skin is macerated. There is some slough and nonviable subcutaneous tissue present on the wound surface. READMISSION 07/17/2022 The patient returns after having not been seen since October. He was seen in the vein and vascular surgery clinic in November and found to have significant venous reflux. He is going to see Dr. Eliza sometime in the coming months, presumably to discuss saphenous vein ablation. They also recommended Unna boot compression and leg elevation, something the patient had been fairly noncompliant with during his first admission in our clinic. He returns today with residual ulcers on his left anterior tibial surface as well as a wound on his lateral foot, secondary to footwear injury. The wounds are dripping edema fluid and he is complaining of pain. There is slough accumulation on all of the open surfaces. Edema control is nonexistent. 07/24/2022: He is accompanied by his wife today. She says that they have had to change the dressings multiple times per day due to saturation from drainage. She asks if he can be seen on a daily basis in the wound care center. All of the wounds are smaller but do have some slough accumulation. They are still pouring edema fluid but the periwound skin is in better condition. Edema control remains  poor. READMISSION 03/23/2023 This is a now 68 year old man we know from previous stays in the clinic. He was here in August to October 23 with a wound on his left lower leg he came for 2 visits in January 2024 with wounds on his left leg and left foot. I am not sure that he healed I would in fact I think not he simply stopped coming. He has known venous insufficiency. He saw a vein and vascular in late 2023 noted to have venous  reflux bilaterally in the anterior legs duplex ultrasound on the right showed deep vein reflux in the common femoral vein and superficial vein reflux of the super femoral junction and the greater saphenous vein. The greater saphenous vein showed reflux in the mid thigh and proximal calf. The patient has a weeping inflamed area on the right anterior lower leg with weeping edema fluid coming out a small wounds. He does not have anything definitively open on the left anterior lower leg although he has evidence of stasis dermatitis and some dry flaking skin. On the right he has been using copious amount of Band-Aids maxipads and changing the dressing frequently. His wife reminds us  that she thinks that the compression change once a week is insufficient to control his drainage. Both the intake nurse and myself told them that we might be able to work him in for a nurse visit once a week but we certainly cannot do that more frequently Past medical history is essentially unchanged he has a dilated cardiomyopathy, persistent atrial fibrillation receiving previous cardioversions. He has undergone a recent ablation of his prostate in August of this year. Chronic diastolic heart failure hypertension mild mitral regurgitation. He has chronic kidney disease with the most recent estimate estimated creatinine clearance of 42 and a most recent creatinine of 2.19 in August of this year. His ABI in our clinic was 1.29 9/23; patient arrives in clinic today with the wounds on the right leg healed. He did not have anything open on the left. He has chronic venous insufficiency with severe stasis dermatitis. He brought in his stockings but they are probably support stockings in the 15 to 20 mmHg. We took leg measurements and suggest that he get 30-40 stockings at elastic therapy in [place]. I note that he has had previous venous reflux studies that showed reflux in the common femoral vein and superficial vein reflux in the  saphenofemoral junction and greater saphenous vein on the right. He might actually be a candidate for ablation surgery if he comes back. Everything is closed today READMISSION 07/09/2023 Since his last admission to the clinic, he has undergone bilateral endovenous radiofrequency ablation of the saphenous veins. He has also had 2 injections of sclerotherapy solution. The most recent was done on December 17 and he unfortunately has developed cellulitis as a result. The right leg is warm, extremely swollen and tender, with a large circumferential wound that appears to be secondary to blistering. He has apparently taken 2 courses of amoxicillin; the patient's wife does not think it was actually Augmentin but just straight amoxicillin without the clavulanic acid. He has failed to demonstrate any improvement and he returns to the wound care center for further evaluation and management. 07/13/2023: The patient contacted our office for an unscheduled visit. It initially started as a nurse only visit, but based on the intake nurse findings, we have changed it to a doctor visit. His lower leg is still extremely red and quite excoriated due to heavy drainage. His  erythema continues to extend up his medial thigh. He continues to feel significant malaise. He did start taking Bactrim  last week after our visit, but he does not appear to be responding appropriately. Electronic Signature(s) Signed: 07/13/2023 12:54:54 PM By: Marolyn Nest MD FACS Entered By: Marolyn Nest on 07/13/2023 09:54:54 -------------------------------------------------------------------------------- Physical Exam Details Patient Name: Date of Service: Clinton MARYANNE LOISE SAMUEL RD O. 07/13/2023 12:30 PM Medical Record Number: [id number] Patient Account Number: [id number] Date of Birth/Sex: Treating RN: June 21, 1956 (68 y.o. [name], [name] ([id number]) [id number] Page 3 of 8 Primary Care Provider: Arloa Fallow  Other Clinician: Referring Provider: Treating Provider/Extender: Marolyn Nest Arloa Fallow [name] in Treatment: 0 Constitutional . Slightly tachycardic. . . no acute distress. Respiratory Normal work of breathing on room air.. Notes 07/13/2023: His lower leg is still extremely red and quite excoriated due to heavy drainage. His erythema continues to extend up his medial thigh. He continues to feel significant malaise. Electronic Signature(s) Signed: 07/13/2023 12:55:50 PM By: Marolyn Nest MD FACS Entered By: Marolyn Nest on 07/13/2023 09:55:50 -------------------------------------------------------------------------------- Physician Orders Details Patient Name: Date of Service: Clinton MARYANNE LOISE SAMUEL RD O. 07/13/2023 12:30 PM Medical Record Number: [id number] Patient Account Number: [id number] Date of Birth/Sex: Treating RN: 13-Sep-1955 (68 y.o. [name] Primary Care Provider: Arloa Fallow Other Clinician: Referring Provider: Treating Provider/Extender: Marolyn Nest Arloa Fallow [name] in Treatment: 0 The following information was scribed by: Merleen [name] The information was scribed for: Marolyn Nest Verbal / Phone Orders: No Diagnosis Coding ICD-10 Coding Code Description [phone] Non-pressure chronic ulcer of other part of right lower leg with fat layer exposed L03.115 Cellulitis of right lower limb I87.2 Venous insufficiency (chronic) (peripheral) I50.32 Chronic diastolic (congestive) heart failure Follow-up Appointments ppointment in 1 week. - Dr. Marolyn Return A Other: - Go to emergency room for treatment of right leg cellulitis Anesthetic Wound #8 Right,Circumferential Lower Leg (In clinic) Topical Lidocaine  4% applied to wound bed Bathing/ Shower/ Hygiene May shower with protection but do not get wound dressing(s) wet. Protect dressing(s) with water  repellant cover (for example, large plastic bag) or a cast cover and may then take  shower. Edema Control - Orders / Instructions Right Lower Extremity Elevate legs to the level of the heart or above for 30 minutes daily and/or when sitting for 3-4 times a day throughout the day. Avoid standing for long periods of time. Exercise regularly Wound Treatment Wound #8 - Lower Leg Wound Laterality: Right, Circumferential Topical: Mupirocin Ointment 1 x Per Week Discharge Instructions: Apply Mupirocin (Bactroban) as instructed Prim Dressing: PolyMem Silver Non-Adhesive Dressing, 4.25x4.25 in 1 x Per Week ary Discharge Instructions: Apply to wound bed as instructed [name], [name] ([id number]) [phone].pdf Page 4 of 8 Secondary Dressing: ABD Pad, 8x10 1 x Per Week Discharge Instructions: Apply over primary dressing as directed. Compression Wrap: Kerlix Roll 4.5x3.1 (in/yd) 1 x Per Week Discharge Instructions: Apply Kerlix and Coban compression as directed. Compression Wrap: Coban Self-Adherent Wrap 4x5 (in/yd) 1 x Per Week Discharge Instructions: Apply over Kerlix as directed. Electronic Signature(s) Signed: 07/13/2023 1:08:21 PM By: Marolyn Nest MD FACS Signed: 07/13/2023 3:55:02 PM By: Merleen Handing RN, BSN Entered By: Merleen [name] on 07/13/2023 09:58:54 -------------------------------------------------------------------------------- Problem List Details Patient Name: Date of Service: Clinton MARYANNE LOISE SAMUEL RD O. 07/13/2023 12:30 PM Medical Record Number: [id number] Patient Account Number: [id number] Date of Birth/Sex: Treating RN: 22-Sep-1955 (68 y.o. M) Primary Care Provider: Arloa Fallow Other Clinician: Referring Provider: Treating Provider/Extender: Marolyn Nest Arloa Fallow [name] in Treatment:  0 Active Problems ICD-10 Encounter Code Description Active Date MDM Diagnosis L97.812 Non-pressure chronic ulcer of other part of right lower leg with fat layer 07/09/2023 No Yes exposed L03.115 Cellulitis of right lower limb 07/09/2023  No Yes I87.2 Venous insufficiency (chronic) (peripheral) 07/09/2023 No Yes I50.32 Chronic diastolic (congestive) heart failure 07/09/2023 No Yes Inactive Problems Resolved Problems Electronic Signature(s) Signed: 07/13/2023 12:53:21 PM By: Marolyn Nest MD FACS Entered By: Marolyn Nest on 07/13/2023 09:53:21 Progress Note Details -------------------------------------------------------------------------------- [name] ([id number]) [id number] Page 5 of 8 Patient Name: Date of Service: Clinton MARYANNE LOISE SAMUEL RD O. 07/13/2023 12:30 PM Medical Record Number: [id number] Patient Account Number: [id number] Date of Birth/Sex: Treating RN: [birth year] (68 y.o. M) Primary Care Provider: Arloa Fallow Other Clinician: Referring Provider: Treating Provider/Extender: Marolyn Nest Arloa Fallow [name] in Treatment: 0 Subjective Chief Complaint Information obtained from Patient Patient presents for treatment of an open ulcer due to venous insufficiency 03/23/2023; patient returns to clinic today for review of wounds and drainage on the right lower leg 07/09/2023: returns with same, cellulitis History of Present Illness (HPI) ADMISSION 02/24/2022 This is a 68 year old man with a past medical history significant for congestive heart failure, morbid obesity, dilated cardiomyopathy, atrial fibrillation, and lower extremity cellulitis. He is currently being treated with Keflex . He is not diabetic. He does not smoke. He says that he developed some blisters on his left anterior tibial surface which subsequently broke open causing the wounds for which she is here to see us  today. He says that he occasionally wears compression stockings but does not do so on a regular basis. He reports that they look stupid with shorts. ABI in clinic today was 0.94. On the left anterior tibial surface, there are several small wounds in a geographic pattern. They do appear consistent with  blisters that have ruptured. The fat layer is exposed and there is a thin layer of slough on the surfaces. He does have some surrounding erythema, but no purulent drainage. Edema control is very poor. 03/03/2022: The patient came in for a nurse visit last week because he thought the compression wraps were too tight. They were reapplied and apparently he did not understand the instruction to try to stay off his feet is much as possible and keep his leg elevated; he instead walked excessively on both Friday and Saturday. As result his leg became more swollen, the wrap became uncomfortable and he cut it off yesterday. He replaced it with a wrap of his own fashion. Today he has an indentation in his leg where his homemade wrap ended. He has extensive 3+ nonpitting edema above this. The wounds themselves are in fairly decent condition with just a little bit of slough on the surface, but edema fluid is frankly pouring out of the largest wound. 03/14/2022: Significant improvement in his wound. He has come in a couple of times for nurse visits due to drainage. Today the wound is quite a bit smaller and just has a layer of eschar and slough on the surface. Edema control is better. 03/23/2022: Ongoing improvement in his wound. His edema control is suboptimal today, however, because his wrap slid down. There is also evidence of excoriation suggesting that perhaps he was scratching under the wrap and it slid. There is a little bit of eschar and slough on the wound surface. 03/28/2022: The patient has been fairly noncompliant with his compression wraps. As result, he has opened a new area of wounding on his anterior tibial surface just proximal to  the original site. It is limited breakdown of skin. The original site is smaller and has a little slough buildup. He did see his cardiologist this week and is now taking torsemide  twice a day. 04/04/2022: The patient continues to be fairly noncompliant with his care  recommendations. He fails to show up for his venous reflux studies on Monday. He continues to cut his wraps off. Although he is supposed to be taking torsemide  twice a day, the degree of edema in his legs suggest that perhaps he is not being compliant with his medication, as well. His blood pressure was elevated today and he said that he did not take his antihypertensives. He claims that the methocarbamol  and tizanidine  he was prescribed for muscle spasms are responsible for his leg swelling. He has developed a new wound on his right anterior tibial surface. This is fairly small and limited to breakdown of skin. There is a small amount of slough present. The wounds on his left lower leg do not look at all improved. There is slough accumulation on a couple of the deeper spots. He also has an area on his dorsal foot and anterior ankle that look like they are threatening to breakdown; this is where he had cut his wrap back to so that they were rubbing right in this location. 04/14/2022: The right leg has healed and there has been substantial improvement to the wounds on the left. 04/21/2022: The left leg has deteriorated considerably. It is massively edematous and erythematous. The wound is much larger and is pouring fluid. He says that he got it wet in the shower when his cast protector apparently leaked. He says that he had to walk half a mile this morning to his car, as it had been parked some distance away. 05/01/2022: Since our last visit, the patient was admitted to the hospital due to shortness of breath and was found to have severe urinary retention. A Foley catheter was placed and he was diuresed. He continues to have the indwelling catheter and has an appointment with urology later today. His wound has not really improved at all. His leg is quite edematous with 3+ pitting edema to the mid thigh. The wound is erythematous and the skin is macerated. There is some slough and nonviable subcutaneous  tissue present on the wound surface. READMISSION 07/17/2022 The patient returns after having not been seen since October. He was seen in the vein and vascular surgery clinic in November and found to have significant venous reflux. He is going to see Dr. Eliza sometime in the coming months, presumably to discuss saphenous vein ablation. They also recommended Unna boot compression and leg elevation, something the patient had been fairly noncompliant with during his first admission in our clinic. He returns today with residual ulcers on his left anterior tibial surface as well as a wound on his lateral foot, secondary to footwear injury. The wounds are dripping edema fluid and he is complaining of pain. There is slough accumulation on all of the open surfaces. Edema control is nonexistent. 07/24/2022: He is accompanied by his wife today. She says that they have had to change the dressings multiple times per day due to saturation from drainage. She asks if he can be seen on a daily basis in the wound care center. All of the wounds are smaller but do have some slough accumulation. They are still pouring edema fluid but the periwound skin is in better condition. Edema control remains poor. READMISSION 03/23/2023 This is a  now 68 year old man we know from previous stays in the clinic. He was here in August to October 23 with a wound on his left lower leg he came for 2 visits in January 2024 with wounds on his left leg and left foot. I am not sure that he healed I would in fact I think not he simply stopped coming. He has known venous insufficiency. He saw a vein and vascular in late 2023 noted to have venous reflux bilaterally in the anterior legs duplex ultrasound on the right showed deep vein reflux in the common femoral vein and superficial vein reflux of the super femoral junction and the greater saphenous vein. The greater saphenous vein showed reflux in the mid thigh and proximal calf. The patient  has a weeping inflamed area on the right anterior lower leg with weeping edema fluid coming out a small wounds. He does not have anything definitively open on the left anterior lower leg although he has evidence of stasis dermatitis and some dry flaking skin. On the right he has been using copious amount of Band-Aids maxipads and changing the dressing frequently. His wife reminds us  that she thinks that the compression change once a week is insufficient to control his drainage. Both the intake nurse and myself told them that we might be able to work him in for a nurse visit once a week but we certainly cannot do that more frequently Past medical history is essentially unchanged he has a dilated cardiomyopathy, persistent atrial fibrillation receiving previous cardioversions. He has [name], [name] ([id number]) [id number] Page 6 of 8 undergone a recent ablation of his prostate in August of this year. Chronic diastolic heart failure hypertension mild mitral regurgitation. He has chronic kidney disease with the most recent estimate estimated creatinine clearance of 42 and a most recent creatinine of 2.19 in August of this year. His ABI in our clinic was 1.29 9/23; patient arrives in clinic today with the wounds on the right leg healed. He did not have anything open on the left. He has chronic venous insufficiency with severe stasis dermatitis. He brought in his stockings but they are probably support stockings in the 15 to 20 mmHg. We took leg measurements and suggest that he get 30-40 stockings at elastic therapy in [place]. I note that he has had previous venous reflux studies that showed reflux in the common femoral vein and superficial vein reflux in the saphenofemoral junction and greater saphenous vein on the right. He might actually be a candidate for ablation surgery if he comes back. Everything is closed today READMISSION 07/09/2023 Since his last admission to  the clinic, he has undergone bilateral endovenous radiofrequency ablation of the saphenous veins. He has also had 2 injections of sclerotherapy solution. The most recent was done on December 17 and he unfortunately has developed cellulitis as a result. The right leg is warm, extremely swollen and tender, with a large circumferential wound that appears to be secondary to blistering. He has apparently taken 2 courses of amoxicillin; the patient's wife does not think it was actually Augmentin but just straight amoxicillin without the clavulanic acid. He has failed to demonstrate any improvement and he returns to the wound care center for further evaluation and management. 07/13/2023: The patient contacted our office for an unscheduled visit. It initially started as a nurse only visit, but based on the intake nurse findings, we have changed it to a doctor visit. His lower leg is still extremely red and quite excoriated due to  heavy drainage. His erythema continues to extend up his medial thigh. He continues to feel significant malaise. He did start taking Bactrim  last week after our visit, but he does not appear to be responding appropriately. Objective Constitutional Slightly tachycardic. no acute distress. Vitals Time Taken: 12:46 PM, Height: 75 in, Weight: 302 lbs, BMI: 37.7, Temperature: 98.2 F, Pulse: 109 bpm, Respiratory Rate: 22 breaths/min, Blood Pressure: 128/81 mmHg. Respiratory Normal work of breathing on room air.. General Notes: 07/13/2023: His lower leg is still extremely red and quite excoriated due to heavy drainage. His erythema continues to extend up his medial thigh. He continues to feel significant malaise. Integumentary (Hair, Skin) Wound #8 status is Open. Original cause of wound was Gradually Appeared. The date acquired was: 06/26/2023. The wound is located on the Right,Circumferential Lower Leg. The wound measures 22cm length x 37cm width x 31cm depth; 639.314cm^2 area and  19818.74cm^3 volume. There is Fat Layer (Subcutaneous Tissue) exposed. There is no tunneling or undermining noted. There is a large amount of serous drainage noted. The wound margin is indistinct and nonvisible. There is large (67-100%) red granulation within the wound bed. There is a small (1-33%) amount of necrotic tissue within the wound bed including Adherent Slough. The periwound skin appearance exhibited: Excoriation, Dry/Scaly, Hemosiderin Staining, Erythema. The surrounding wound skin color is noted with erythema which is circumferential. Periwound temperature was noted as Hot. The periwound has tenderness on palpation. Assessment Active Problems ICD-10 Non-pressure chronic ulcer of other part of right lower leg with fat layer exposed Cellulitis of right lower limb Venous insufficiency (chronic) (peripheral) Chronic diastolic (congestive) heart failure Plan Follow-up Appointments: Return Appointment in 1 week. - Dr. Marolyn Other: - Go to emergency room for treatment of right leg cellulitis Anesthetic: Wound #8 Right,Circumferential Lower Leg: (In clinic) Topical Lidocaine  4% applied to wound bed Bathing/ Shower/ Hygiene: May shower with protection but do not get wound dressing(s) wet. Protect dressing(s) with water  repellant cover (for example, large plastic bag) or a cast cover and may then take shower. Edema Control - Orders / Instructions: [name], [name] ([id number]) [id number].pdf Page 7 of 8 Elevate legs to the level of the heart or above for 30 minutes daily and/or when sitting for 3-4 times a day throughout the day. Avoid standing for long periods of time. Exercise regularly WOUND #8: - Lower Leg Wound Laterality: Right, Circumferential Topical: Mupirocin Ointment 1 x Per Week/ Discharge Instructions: Apply Mupirocin (Bactroban) as instructed Prim Dressing: PolyMem Silver Non-Adhesive Dressing, 4.25x4.25 in 1 x Per Week/ ary Discharge  Instructions: Apply to wound bed as instructed Secondary Dressing: ABD Pad, 8x10 1 x Per Week/ Discharge Instructions: Apply over primary dressing as directed. Com pression Wrap: Kerlix Roll 4.5x3.1 (in/yd) 1 x Per Week/ Discharge Instructions: Apply Kerlix and Coban compression as directed. Com pression Wrap: Coban Self-Adherent Wrap 4x5 (in/yd) 1 x Per Week/ Discharge Instructions: Apply over Kerlix as directed. 07/13/2023: The patient contacted our office for an unscheduled visit. It initially started as a nurse only visit, but based on the intake nurse findings, we have changed it to a doctor visit. His lower leg is still extremely red and quite excoriated due to heavy drainage. His erythema continues to extend up his medial thigh. He continues to feel significant malaise. He did start taking Bactrim  last week after our visit, but he does not appear to be responding appropriately. I did not perform any debridement today. I think this represents a failure of outpatient therapy and I have  recommended that he present himself to the emergency room to be admitted for IV antibiotic treatment. We have changed his contact layer to silver alginate, but as he is going to the hospital and they will promptly cut his wrap off, we have just wrapped his leg with Kerlix, rather than a true compression wrap. Follow-up pending hospital discharge. Electronic Signature(s) Signed: 07/13/2023 1:08:21 PM By: Marolyn Nest MD FACS Signed: 07/13/2023 3:55:02 PM By: Merleen Handing RN, BSN Previous Signature: 07/13/2023 12:57:24 PM Version By: Marolyn Nest MD FACS Entered By: Merleen [name] on 07/13/2023 10:02:45 -------------------------------------------------------------------------------- SuperBill Details Patient Name: Date of Service: Clinton MARYANNE LOISE SAMUEL RD O. 07/13/2023 Medical Record Number: [id number] Patient Account Number: [id number] Date of Birth/Sex: Treating RN: 31-Dec-1955 (68 y.o. M) Primary Care  Provider: Arloa Fallow Other Clinician: Referring Provider: Treating Provider/Extender: Marolyn Nest Arloa Fallow [name] in Treatment: 0 Diagnosis Coding ICD-10 Codes Code Description [phone] Non-pressure chronic ulcer of other part of right lower leg with fat layer exposed L03.115 Cellulitis of right lower limb I87.2 Venous insufficiency (chronic) (peripheral) I50.32 Chronic diastolic (congestive) heart failure Facility Procedures : CPT4 Code: [id number] Description: 99213 - WOUND CARE VISIT-LEV 3 EST PT Modifier: Quantity: 1 Physician Procedures : CPT4 Code Description Modifier [id number] - WC PHYS LEVEL 5 - EST PT ICD-10 Diagnosis Description L97.812 Non-pressure chronic ulcer of other part of right lower leg with fat layer exposed L03.115 Cellulitis of right lower limb I87.2 Venous  insufficiency (chronic) (peripheral) I50.32 Chronic diastolic (congestive) heart failure Quantity: 1 Electronic Signature(s) [name], [name] ([id number]) [id number].pdf Page 8 of 8 Signed: 07/13/2023 1:08:21 PM By: Marolyn Nest MD FACS Signed: 07/13/2023 3:55:02 PM By: Merleen Handing RN, BSN Previous Signature: 07/13/2023 12:57:37 PM Version By: Marolyn Nest MD FACS Entered By: Merleen [name] on 07/13/2023 10:04:20

## 2023-07-15 ENCOUNTER — Ambulatory Visit
Admission: RE | Admit: 2023-07-15 | Discharge: 2023-07-15 | Disposition: A | Discharge status: Home / Self Care | Payer: Medicare Other | Source: Ambulatory Visit | Attending: Family Medicine | Admitting: Family Medicine

## 2023-07-15 DIAGNOSIS — R600 Localized edema: Secondary | ICD-10-CM

## 2023-07-16 ENCOUNTER — Ambulatory Visit (HOSPITAL_BASED_OUTPATIENT_CLINIC_OR_DEPARTMENT_OTHER): Payer: Medicare Other | Admitting: General Surgery

## 2023-07-20 ENCOUNTER — Ambulatory Visit (HOSPITAL_BASED_OUTPATIENT_CLINIC_OR_DEPARTMENT_OTHER): Payer: Medicare Other | Admitting: General Surgery

## 2023-07-21 ENCOUNTER — Encounter (HOSPITAL_BASED_OUTPATIENT_CLINIC_OR_DEPARTMENT_OTHER): Payer: Medicare Other | Admitting: Internal Medicine

## 2023-07-30 ENCOUNTER — Ambulatory Visit: Payer: Medicare Other | Attending: Nurse Practitioner | Admitting: Nurse Practitioner

## 2023-07-30 ENCOUNTER — Encounter: Payer: Self-pay | Admitting: Nurse Practitioner

## 2023-07-30 VITALS — BP 120/80 | HR 66 | Wt 288.0 lb

## 2023-07-30 DIAGNOSIS — I10 Essential (primary) hypertension: Secondary | ICD-10-CM | POA: Diagnosis present

## 2023-07-30 DIAGNOSIS — I340 Nonrheumatic mitral (valve) insufficiency: Secondary | ICD-10-CM | POA: Diagnosis not present

## 2023-07-30 DIAGNOSIS — N184 Chronic kidney disease, stage 4 (severe): Secondary | ICD-10-CM

## 2023-07-30 DIAGNOSIS — I4821 Permanent atrial fibrillation: Secondary | ICD-10-CM | POA: Diagnosis present

## 2023-07-30 DIAGNOSIS — I5032 Chronic diastolic (congestive) heart failure: Secondary | ICD-10-CM | POA: Diagnosis not present

## 2023-07-30 DIAGNOSIS — I420 Dilated cardiomyopathy: Secondary | ICD-10-CM | POA: Diagnosis not present

## 2023-07-30 LAB — CBC
Hematocrit: 40.5 % (ref 37.5–51.0)
Hemoglobin: 12.6 g/dL — ABNORMAL LOW (ref 13.0–17.7)
MCH: 28.3 pg (ref 26.6–33.0)
MCHC: 31.1 g/dL — ABNORMAL LOW (ref 31.5–35.7)
MCV: 91 fL (ref 79–97)
Platelets: 298 10*3/uL (ref 150–450)
RBC: 4.46 x10E6/uL (ref 4.14–5.80)
RDW: 15.4 % (ref 11.6–15.4)
WBC: 6.3 10*3/uL (ref 3.4–10.8)

## 2023-07-30 LAB — BASIC METABOLIC PANEL
BUN/Creatinine Ratio: 11 (ref 10–24)
BUN: 19 mg/dL (ref 8–27)
CO2: 21 mmol/L (ref 20–29)
Calcium: 8.9 mg/dL (ref 8.6–10.2)
Chloride: 104 mmol/L (ref 96–106)
Creatinine, Ser: 1.79 mg/dL — ABNORMAL HIGH (ref 0.76–1.27)
Glucose: 103 mg/dL — ABNORMAL HIGH (ref 70–99)
Potassium: 5 mmol/L (ref 3.5–5.2)
Sodium: 136 mmol/L (ref 134–144)
eGFR: 41 mL/min/{1.73_m2} — ABNORMAL LOW (ref >59)

## 2023-07-30 NOTE — Patient Instructions (Signed)
Medication Instructions:  Your physician recommends that you continue on your current medications as directed. Please refer to the Current Medication list given to you today.  *If you need a refill on your cardiac medications before your next appointment, please call your pharmacy*   Lab Work: Bmet & cbc Today   Testing/Procedures: NONE ordered at this time of appointment    Follow-Up: At [facility], you and your health needs are our priority.  As part of our continuing mission to provide you with exceptional heart care, we have created designated Provider Care Teams.  These Care Teams include your primary Cardiologist (physician) and Advanced Practice Providers (APPs -  Physician Assistants and Nurse Practitioners) who all work together to provide you with the care you need, when you need it.  We recommend signing up for the patient portal called "MyChart".  Sign up information is provided on this After Visit Summary.  MyChart is used to connect with patients for Virtual Visits (Telemedicine).  Patients are able to view lab/test results, encounter notes, upcoming appointments, etc.  Non-urgent messages can be sent to your provider as well.   To learn more about what you can do with MyChart, go to [email or web address].    Your next appointment:   3 month(s)  Provider:   Bernadene Person, NP

## 2023-07-30 NOTE — Progress Notes (Signed)
Office Visit    Patient Name: [name] Date of Encounter: 07/30/2023  Primary Care Provider:  Noberto Retort, MD Primary Cardiologist:  [name], MD  Chief Complaint    68 year old male with a history of permanent atrial fibrillation, cardiomyopathy with improved EF, chronic diastolic heart failure, mild to moderate mitral valve regurgitation, hypertension, hyperlipidemia, chronic venous insufficiency, CKD stage III-IV, and obesity who presents for follow-up related to heart failure.   Past Medical History    Past Medical History:  Diagnosis Date   Allergic rhinitis    Anxiety    Asthma    as a child   Cellulitis    Chronic kidney disease    Chronic low back pain    Complication of anesthesia    "hard to put asleep, hard to wake up, nausea and vomiting"   Depression    Dysrhythmia    ED (erectile dysfunction)    GERD (gastroesophageal reflux disease)    HLD (hyperlipidemia)    HTN (hypertension)    sees Dr. Elias Else, [facility]   Hypertrophy of prostate with urinary obstruction and other lower urinary tract symptoms (LUTS)    IBS (irritable bowel syndrome)    Inguinal hernia without mention of obstruction or gangrene, unilateral or unspecified, (not specified as recurrent)    Insomnia    Lumbar herniated disc    L7   Morbid obesity (HCC)    Narcotic addiction (HCC)    NICM (nonischemic cardiomyopathy) (HCC)    tachycardia mediated,  resolved with sinus   Persistent atrial fibrillation (HCC)    ablation done 03/2006 at [facility]   Pneumonia    hx of 2004   PONV (postoperative nausea and vomiting)    Sleep apnea    Twitching    legs   Past Surgical History:  Procedure Laterality Date   ATRIAL ABLATION SURGERY  2007   [facility]   CARDIOVERSION  08/11/2011   Procedure: CARDIOVERSION;  Surgeon: Lewayne Bunting, MD;  Location: [facility] OR;  Service: Cardiovascular;  Laterality: N/A;   CARDIOVERSION N/A 04/16/2017   Procedure: CARDIOVERSION;  Surgeon:  Thurmon Fair, MD;  Location: [facility] ENDOSCOPY;  Service: Cardiovascular;  Laterality: N/A;   CARDIOVERSION N/A 03/04/2019   Procedure: CARDIOVERSION;  Surgeon: Lewayne Bunting, MD;  Location: [facility] ENDOSCOPY;  Service: Cardiovascular;  Laterality: N/A;   CARDIOVERSION N/A 07/04/2019   Procedure: CARDIOVERSION;  Surgeon: Chilton Si, MD;  Location: [facility] ENDOSCOPY;  Service: Cardiovascular;  Laterality: N/A;   EYE SURGERY     lasik, 1998   HERNIA REPAIR     1977   INGUINAL HERNIA REPAIR Right 08/26/2012   Procedure: LAPAROSCOPIC INGUINAL HERNIA;  Surgeon: Lodema Pilot, DO;  Location: [facility] OR;  Service: General;  Laterality: Right;  laparoscopic right inguinal hernia repair with mesh, umbilical hernia repair   INSERTION OF MESH Right 08/26/2012   Procedure: INSERTION OF MESH;  Surgeon: Lodema Pilot, DO;  Location: [facility] OR;  Service: General;  Laterality: Right;  right inguinal hernia   IR FLUORO GUIDE CV LINE RIGHT  08/15/2022   IR US GUIDE VASC ACCESS RIGHT  08/15/2022   TEE WITHOUT CARDIOVERSION N/A 03/04/2019   Procedure: TRANSESOPHAGEAL ECHOCARDIOGRAM (TEE);  Surgeon: Lewayne Bunting, MD;  Location: [facility] ENDOSCOPY;  Service: Cardiovascular;  Laterality: N/A;   UMBILICAL HERNIA REPAIR N/A 08/26/2012   Procedure: HERNIA REPAIR UMBILICAL ADULT;  Surgeon: Lodema Pilot, DO;  Location: [facility] OR;  Service: General;  Laterality: N/A;    Allergies  Allergies  Allergen Reactions   Baclofen Other (See Comments)    Severe dizziness   Doxycycline Hyclate Other (See Comments)    Flu-like symptoms   Gabapentin Swelling and Other (See Comments)    Swelling in feet   Hycodan [Hydrocodone Bit-Homatrop Mbr] Other (See Comments)    Dizziness    Hydrocodone-Acetaminophen Other (See Comments)    GI upset, Dizziness   Pregabalin Swelling and Other (See Comments)    Severly dry mouth     Labs/Other Studies Reviewed    The following studies were reviewed today:  Cardiac Studies & Procedures       ECHOCARDIOGRAM  ECHOCARDIOGRAM COMPLETE 04/25/2022  Narrative ECHOCARDIOGRAM REPORT    Patient Name:   [name] Date of Exam: 04/25/2022 Medical Rec #:  [id number]         Height:       76.0 in Accession #:    [id number]        Weight:       350.1 lb Date of Birth:  1956-06-09        BSA:          2.810 m Patient Age:    65 years          BP:           169/102 mmHg Patient Gender: M                 HR:           90 bpm. Exam Location:  Inpatient  Procedure: 2D Echo, Color Doppler and Cardiac Doppler  Indications:    CHF- Acute Diastolic  History:        Patient has prior history of Echocardiogram examinations, most recent 04/02/2021. Arrythmias:Atrial Fibrillation; Risk Factors:Hypertension and Dyslipidemia.  Referring Phys: [id number] CAROLE N HALL  IMPRESSIONS   1. Left ventricular ejection fraction, by estimation, is 60 to 65%. The left ventricle has normal function. The left ventricle has no regional wall motion abnormalities. There is mild left ventricular hypertrophy. Left ventricular diastolic parameters are indeterminate. 2. Right ventricular systolic function is normal. The right ventricular size is normal. 3. Left atrial size was mildly dilated. 4. Mild mitral valve regurgitation. 5. The aortic valve is normal in structure. Aortic valve regurgitation is not visualized.  Comparison(s): The left ventricular function is unchanged compared to echo from September 2022.Marland Kitchen  FINDINGS Left Ventricle: Left ventricular ejection fraction, by estimation, is 60 to 65%. The left ventricle has normal function. The left ventricle has no regional wall motion abnormalities. The left ventricular internal cavity size was normal in size. There is mild left ventricular hypertrophy. Left ventricular diastolic parameters are indeterminate.  Right Ventricle: The right ventricular size is normal. Right vetricular wall thickness was not assessed. Right ventricular systolic function is  normal.  Left Atrium: Left atrial size was mildly dilated.  Right Atrium: Right atrial size was normal in size.  Pericardium: Trivial pericardial effusion is present.  Mitral Valve: There is mild thickening of the mitral valve leaflet(s). Mild mitral valve regurgitation.  Tricuspid Valve: The tricuspid valve is normal in structure. Tricuspid valve regurgitation is trivial.  Aortic Valve: The aortic valve is normal in structure. Aortic valve regurgitation is not visualized. Aortic valve mean gradient measures 4.0 mmHg. Aortic valve peak gradient measures 7.3 mmHg. Aortic valve area, by VTI measures 2.93 cm.  Pulmonic Valve: The pulmonic valve was normal in structure. Pulmonic valve regurgitation is not visualized.  Aorta: The aortic root and ascending  aorta are structurally normal, with no evidence of dilitation.  IAS/Shunts: The interatrial septum was not assessed.   LEFT VENTRICLE PLAX 2D LVIDd:         5.40 cm LVIDs:         3.70 cm LV PW:         1.20 cm LV IVS:        1.30 cm LVOT diam:     2.30 cm LV SV:         72 LV SV Index:   26 LVOT Area:     4.15 cm   RIGHT VENTRICLE TAPSE (M-mode): 1.8 cm  LEFT ATRIUM              Index        RIGHT ATRIUM           Index LA diam:        6.00 cm  2.13 cm/m   RA Area:     18.60 cm LA Vol (A2C):   124.5 ml 44.30 ml/m  RA Volume:   45.70 ml  16.26 ml/m LA Vol (A4C):   109.0 ml 38.79 ml/m LA Biplane Vol: 106.0 ml 37.72 ml/m AORTIC VALVE AV Area (Vmax):    3.23 cm AV Area (Vmean):   3.03 cm AV Area (VTI):     2.93 cm AV Vmax:           135.00 cm/s AV Vmean:          95.600 cm/s AV VTI:            0.247 m AV Peak Grad:      7.3 mmHg AV Mean Grad:      4.0 mmHg LVOT Vmax:         105.00 cm/s LVOT Vmean:        69.700 cm/s LVOT VTI:          0.174 m LVOT/AV VTI ratio: 0.70  AORTA Ao Root diam: 3.40 cm Ao Asc diam:  3.80 cm  MR Peak grad: 76.2 mmHg MR Vmax:      436.50 cm/s SHUNTS Systemic VTI:  0.17  m Systemic Diam: 2.30 cm  Dietrich Pates MD Electronically signed by Dietrich Pates MD Signature Date/Time: 04/25/2022/10:56:14 AM    Final  TEE  ECHO TEE 03/04/2019  Narrative TRANSESOPHOGEAL ECHO REPORT    Patient Name:   [name] Date of Exam: 03/04/2019 Medical Rec #:  [id number]         Height:       77.0 in Accession #:    [id number]        Weight:       329.0 lb Date of Birth:  05/27/1956        BSA:          2.76 m Patient Age:    62 years          BP:           144/101 mmHg Patient Gender: M                 HR:           123 bpm. Exam Location:  Inpatient   Procedure: Transesophageal Echo, Cardiac Doppler and Color Doppler  Indications:     Afib  History:         Patient has prior history of Echocardiogram examinations, most recent 03/31/2017. Atrial Fibrillation Risk Factors: Hypertension, Obesity and Sleep Apnea.  Sonographer:  Lavenia Atlas Referring Phys:  [id number] BRIAN S CRENSHAW Diagnosing Phys: [name] MD    PROCEDURE: The transesophogeal probe was passed through the esophogus of the patient. The patient developed no complications during the procedure.  IMPRESSIONS   1. The left ventricle has normal systolic function, with an ejection fraction of 55-60%. No evidence of left ventricular regional wall motion abnormalities. 2. The right ventricle has normal systolc function. The cavity was normal. 3. Left atrial size was severely dilated. 4. No evidence of a thrombus present in the left atrial appendage. 5. The mitral valve is abnormal. Mild thickening of the mitral valve leaflet. Mitral valve regurgitation is moderate to severe by color flow Doppler. 6. The aortic valve is tricuspid No stenosis of the aortic valve. 7. There is evidence of mild plaque in the descending aorta. 8. Normal LV systolic function; severe LAE with no LAA thrombus; mild RAE; moderately severe (3+) MR; mild TR.  FINDINGS Left Ventricle: The left ventricle has  normal systolic function, with an ejection fraction of 55-60%. No evidence of left ventricular regional wall motion abnormalities.  Right Ventricle: The right ventricle has normal systolic function. The cavity was normal.  Left Atrium: Left atrial size was severely dilated.   Left Atrial Appendage: No evidence of a thrombus present in the left atrial appendage.  Right Atrium: Right atrial size was mildly dilated.  Interatrial Septum: No atrial level shunt detected by color flow Doppler.  Pericardium: There is no evidence of pericardial effusion.  Mitral Valve: The mitral valve is abnormal. Mild thickening of the mitral valve leaflet. Mitral valve regurgitation is moderate to severe by color flow Doppler.  Tricuspid Valve: The tricuspid valve was normal in structure. Tricuspid valve regurgitation is mild by color flow Doppler.  Aortic Valve: The aortic valve is tricuspid Aortic valve regurgitation was not visualized by color flow Doppler. There is No stenosis of the aortic valve.  Pulmonic Valve: The pulmonic valve was grossly normal. Pulmonic valve regurgitation is not visualized by color flow Doppler.  Aorta: There is evidence of mild plaque in the descending aorta.  Additional Findings: Normal LV systolic function; severe LAE with no LAA thrombus; mild RAE; moderately severe (3+) MR; mild TR.   [name] MD Electronically signed by [name] MD Signature Date/Time: 03/04/2019/10:21:22 AM    Final           Recent Labs: 08/13/2022: B Natriuretic Peptide 74.5; TSH 1.144 08/14/2022: ALT 55 08/16/2022: Magnesium 1.7 02/05/2023: BUN 36; Creatinine, Ser 2.19; Hemoglobin 9.5; Platelets 337; Potassium 4.9; Sodium 133  Recent Lipid Panel    Component Value Date/Time   CHOL  02/14/2009 0630    126        ATP III CLASSIFICATION:  <200     mg/dL   Desirable  956-387  mg/dL   Borderline High  >=564    mg/dL   High          TRIG 95 02/14/2009 0630   HDL 48 02/14/2009 0630    CHOLHDL 2.6 02/14/2009 0630   VLDL 19 02/14/2009 0630   LDLCALC  02/14/2009 0630    59        Total Cholesterol/HDL:CHD Risk Coronary Heart Disease Risk Table                     Men   Women  1/2 Average Risk   3.4   3.3  Average Risk       5.0   4.4  2 X Average Risk   9.6   7.1  3 X Average Risk  23.4   11.0        Use the calculated Patient Ratio above and the CHD Risk Table to determine the patient's CHD Risk.        ATP III CLASSIFICATION (LDL):  <100     mg/dL   Optimal  914-782  mg/dL   Near or Above                    Optimal  130-159  mg/dL   Borderline  956-213  mg/dL   High  >086     mg/dL   Very High    History of Present Illness    68 year old male with the above past medical history including permanent atrial fibrillation, cardiomyopathy with improved EF, chronic diastolic heart failure, mild to moderate mitral valve regurgitation, hypertension, hyperlipidemia, chronic venous insufficiency, CKD stage III-IV, and obesity.   Cardiac catheterization in 2004 showed EF 45%, normal coronary arteries.  He had atrial fibrillation at the time, LV function improved with restoration of sinus rhythm.  He ultimately went atrial fibrillation ablation.  He did well for several years but then had recurrent atrial fibrillation.  TEE in August 2020 showed normal LV function, severe left atrial enlargement, moderate to severe mitral valve regurgitation, mild tricuspid valve regurgitation.  He underwent DCCV in 02/2019 with early recurrence of atrial fibrillation.  Repeat DCCV in December 2020 was also unsuccessful.  He was not felt to be a candidate for repeat ablation given severe left atrial enlargement, obesity.  He was evaluated by CT surgery for consideration of surgical maze procedure, medical therapy was ultimately advised.  Echocardiogram in 03/2021 showed normal LV function, mild LVH, mild RV, mild LAE, mild to moderate MR. At his follow-up visit in 03/2022 he noted worsening dyspnea  on exertion, chronic pedal edema.  His torsemide was increased to 60 mg twice daily with an additional 20 mg as needed for worsening edema.  Hydralazine was increased to 100 mg 3 times daily.  He was hospitalized in October 2023 in the setting of acute on chronic diastolic heart failure secondary to urinary retention due to enlarged prostate, AKI on CKD.  Urology and nephrology were consulted.  Repeat echocardiogram showed EF 60 to 65%, normal LV function, no RWMA, mild LVH, normal RV systolic function, mildly dilated left atrium, mild mitral valve regurgitation.  He was hospitalized in February 2024 in the setting of AKI on CKD stage IV (baseline creatinine 3.5-4.5), UTI with sepsis, uremia. He was discharged with a foley catheter, with subsequent improvement in his renal function.  His wife contacted our office on 11/04/2022 with complaints of worsening shortness of breath, orthopnea.  Torsemide was increased to 60 mg daily x 3 days followed by torsemide 40 mg daily. He noted significant improvement in his lower extremity edema loss with this change as well as adherence to sodium and fluid recommendations. He was last in the office on 04/23/2023 and was stable from a cardiac standpoint.  His lower extremity wounds had healed completely, he was gradually increasing his activity and generally feeling well.   He presents today for follow-up accompanied by his wife. Since his last visit he has done well from a cardiac standpoint.  He has stable nonpitting bilateral lower extremity edema, well-controlled with compression, elevation, diuretic.  He denies any chest pain, palpitations, dyspnea, worsening edema, PND, orthopnea, weight gain.  Notes his weight has been  stable at home, will he weighed 282 pounds this morning.  Unfortunately, over the past 10 days he has been sick with a GI virus.  He is slowly recovering.  Otherwise, he reports feeling well.  Home Medications    Current Outpatient Medications   Medication Sig Dispense Refill   alfuzosin (UROXATRAL) 10 MG 24 hr tablet Take 10 mg by mouth daily with breakfast.     apixaban (ELIQUIS) 5 MG TABS tablet TAKE 1 TABLET BY MOUTH TWICE A DAY 60 tablet 5   clonazePAM (KLONOPIN) 0.5 MG tablet Take 0.25-0.5 mg by mouth at bedtime as needed for anxiety.     diltiazem (CARDIZEM CD) 360 MG 24 hr capsule TAKE 1 CAPSULE BY MOUTH EVERY DAY 90 capsule 3   DULoxetine (CYMBALTA) 60 MG capsule Take 60 mg by mouth 2 (two) times daily.     ferrous sulfate 325 (65 FE) MG tablet Take 325 mg by mouth daily.     finasteride (PROSCAR) 5 MG tablet Take 1 tablet (5 mg total) by mouth daily. 30 tablet 0   gabapentin (NEURONTIN) 100 MG capsule Take 1 capsule (100 mg total) by mouth at bedtime. 30 capsule 2   Multiple Vitamins-Minerals (ZINC PO) Take 1 tablet by mouth daily.     omeprazole (PRILOSEC) 20 MG capsule Take 20 mg by mouth daily as needed (for acid reflux).     POTASSIUM PO Take 1 tablet by mouth daily.     rOPINIRole (REQUIP) 1 MG tablet Take 1-2 tablets (1-2 mg total) by mouth at bedtime as needed (restless leg syndrome). 1 tablet after lunch and 2 tablet at bedtime (Patient taking differently: Take 3 mg by mouth at bedtime.) 120 tablet 0   sulfamethoxazole-trimethoprim (BACTRIM) 400-80 MG tablet Take 1 tablet by mouth at bedtime. 10 tablet 0   torsemide (DEMADEX) 20 MG tablet TAKE 40 MG DAILY. MAY TAKE AN ADDITIONAL TABLET ON THE DAYS OF WEIGHT GAIN OR SWELLING. 270 tablet 1   Turmeric (QC TUMERIC COMPLEX PO) Take 1 capsule by mouth daily.     TYLENOL 500 MG tablet Take 1,500 mg by mouth as needed for mild pain or headache.     carvedilol (COREG) 25 MG tablet Take 1 tablet (25 mg total) by mouth 2 (two) times daily. (Patient taking differently: Take 25 mg by mouth daily.) 180 tablet 3   No current facility-administered medications for this visit.     Review of Systems    He denies chest pain, palpitations, dyspnea, pnd, orthopnea, n, v, dizziness,  syncope, weight gain, or early satiety. All other systems reviewed and are otherwise negative except as noted above.   Physical Exam    VS:  BP 120/80 (BP Location: Left Arm, Patient Position: Sitting, Cuff Size: Large)   Pulse 66   Wt 288 lb (130.6 kg)   SpO2 93%   BMI 35.06 kg/m  , BMI Body mass index is 35.06 kg/m. STOP-Bang Score:         GEN: Well nourished, well developed, in no acute distress. HEENT: normal. Neck: Supple, no JVD, carotid bruits, or masses. Cardiac: IRIR, no murmurs, rubs, or gallops. No clubbing, cyanosis, nonpitting bilateral lower extremity edema.  Radials/DP/PT 2+ and equal bilaterally.  Respiratory:  Respirations regular and unlabored, clear to auscultation bilaterally. GI: Soft, nontender, nondistended, BS + x 4. MS: no deformity or atrophy. Skin: warm and dry, no rash. Neuro:  Strength and sensation are intact. Psych: Normal affect.  Accessory Clinical Findings    ECG  personally reviewed by me today - EKG Interpretation Date/Time:  Thursday July 30 2023 08:07:25 EST Ventricular Rate:  66 PR Interval:    QRS Duration:  102 QT Interval:  414 QTC Calculation: 434 R Axis:   48  Text Interpretation: Atrial fibrillation When compared with ECG of 13-Feb-2023 13:54, Vent. rate has decreased BY  44 BPM Confirmed by Bernadene Person ([id number]) on 07/30/2023 8:17:19 AM  - no acute changes.   Lab Results  Component Value Date   WBC 10.2 02/05/2023   HGB 9.5 (L) 02/05/2023   HCT 34.0 (L) 02/05/2023   MCV 86.1 02/05/2023   PLT 337 02/05/2023   Lab Results  Component Value Date   CREATININE 2.19 (H) 02/05/2023   BUN 36 (H) 02/05/2023   NA 133 (L) 02/05/2023   K 4.9 02/05/2023   CL 106 02/05/2023   CO2 19 (L) 02/05/2023   Lab Results  Component Value Date   ALT 55 (H) 08/14/2022   AST 38 08/14/2022   ALKPHOS 90 08/14/2022   BILITOT 0.7 08/14/2022   Lab Results  Component Value Date   CHOL  02/14/2009    126        ATP III CLASSIFICATION:  <200      mg/dL   Desirable  213-086  mg/dL   Borderline High  >=578    mg/dL   High          HDL 48 02/14/2009   LDLCALC  02/14/2009    59        Total Cholesterol/HDL:CHD Risk Coronary Heart Disease Risk Table                     Men   Women  1/2 Average Risk   3.4   3.3  Average Risk       5.0   4.4  2 X Average Risk   9.6   7.1  3 X Average Risk  23.4   11.0        Use the calculated Patient Ratio above and the CHD Risk Table to determine the patient's CHD Risk.        ATP III CLASSIFICATION (LDL):  <100     mg/dL   Optimal  469-629  mg/dL   Near or Above                    Optimal  130-159  mg/dL   Borderline  528-413  mg/dL   High  >244     mg/dL   Very High   TRIG 95 07/09/7251   CHOLHDL 2.6 02/14/2009    Lab Results  Component Value Date   HGBA1C 6.5 (H) 04/19/2021    Assessment & Plan    1. Chronic diastolic heart failure/cardiomyopathy: Prior EF 45%. Cath at the time showed normal coronaries. Most recent echo in 04/2022 showed EF 60 to 65%, normal LV function, no RWMA, mild LVH, normal RV systolic function, mildly dilated left atrium, mild mitral valve regurgitation.  He has a longstanding history of intermittent adherence to his medication regimen, dietary recommendations, and intermittent follow-up resulting in periods of marked fluid volume overload.  However, over the past several months he has done extremely well.  He has been taking his medication, following diet recommendations, and has been more active.  He has had chronic wounds to his bilateral lower extremities which have healed completely in the setting of decreased lower extremity edema.  Generally euvolemic and well compensated on  exam, weight is stable.  I again congratulated him on his progress and encouraged ongoing efforts.  Will check BMET today in the setting of recent GI virus.  Continue carvedilol, torsemide.    2. Permanent atrial fibrillation: Rate controlled. Continue diltiazem, carvedilol, Eliquis.  Will update CBC today.    3. Mitral valve regurgitation: Mild on most recent echo. Asymptomatic.    4. Hypertension: BP well controlled. Continue current antihypertensive regimen.     5. CKD stage III-IV: Creatinine rose to 10.08 during hospitalization in 08/2022 (likely exacerbated by acute urinary retention, bladder outlet obstruction). Renal function improved significantly with placement of Foley catheter, since removed.  Creatinine was 2.19 on 02/05/2023.  Repeat BMET pending as above. Following with urology and nephrology.    6. Obesity: Encouraged increased activity as tolerated.    7. History of snoring: Previously referred for sleep study, however, he ultimately declined.    8. Disposition: Follow-up in 3 months.       Joylene Grapes, NP 07/30/2023, 8:36 AM

## 2023-07-31 ENCOUNTER — Encounter (HOSPITAL_BASED_OUTPATIENT_CLINIC_OR_DEPARTMENT_OTHER): Payer: Self-pay

## 2023-08-21 ENCOUNTER — Ambulatory Visit (INDEPENDENT_AMBULATORY_CARE_PROVIDER_SITE_OTHER): Payer: Medicare Other | Admitting: Podiatry

## 2023-08-21 ENCOUNTER — Encounter: Payer: Self-pay | Admitting: Podiatry

## 2023-08-21 ENCOUNTER — Telehealth: Payer: Self-pay

## 2023-08-21 DIAGNOSIS — G629 Polyneuropathy, unspecified: Secondary | ICD-10-CM | POA: Diagnosis not present

## 2023-08-21 DIAGNOSIS — M79674 Pain in right toe(s): Secondary | ICD-10-CM

## 2023-08-21 DIAGNOSIS — M79675 Pain in left toe(s): Secondary | ICD-10-CM | POA: Diagnosis not present

## 2023-08-21 DIAGNOSIS — B351 Tinea unguium: Secondary | ICD-10-CM

## 2023-08-21 DIAGNOSIS — Z7901 Long term (current) use of anticoagulants: Secondary | ICD-10-CM | POA: Diagnosis not present

## 2023-08-21 MED ORDER — LIDOCAINE 5 % EX PTCH
2.0000 | MEDICATED_PATCH | CUTANEOUS | 2 refills | Status: DC
Start: 2023-08-21 — End: 2023-11-19

## 2023-08-21 NOTE — Telephone Encounter (Addendum)
PA request received from [facility] fo lidocaine 5%. PA submitted.

## 2023-08-21 NOTE — Progress Notes (Signed)
Chief Complaint  Patient presents with   Nail Problem    Patient is here for RFC    HPI: 68 y.o. male presents today for painful, thickened, elongated, dystrophic toenails.  The nails become painful with patient in shoe gear.  He is unable to maintain them himself due to chronic anticoagulation therapy for A-fib.  He also presents with complaint of numbness and tingling to the toes bilaterally.  He is currently taking Gabapentin 300 mg TID from another provider. He denies diabetes.  He does have lower extremity edema secondary to cardiac issues, he does see [facility].  He does have history of chronic low back pain.  He does have history of kidney disease.  Past Medical History:  Diagnosis Date   Allergic rhinitis    Anxiety    Asthma    as a child   Cellulitis    Chronic kidney disease    Chronic low back pain    Complication of anesthesia    "hard to put asleep, hard to wake up, nausea and vomiting"   Depression    Dysrhythmia    ED (erectile dysfunction)    GERD (gastroesophageal reflux disease)    HLD (hyperlipidemia)    HTN (hypertension)    sees Dr. [facility]as [facility], [facility]   Hypertrophy of prostate with urinary obstruction and other lower urinary tract symptoms (LUTS)    IBS (irritable bowel syndrome)    Inguinal hernia without mention of obstruction or gangrene, unilateral or unspecified, (not specified as recurrent)    Insomnia    Lumbar herniated disc    L7   Morbid obesity (HCC)    Narcotic addiction (HCC)    NICM (nonischemic cardiomyopathy) (HCC)    tachycardia mediated,  resolved with sinus   Persistent atrial fibrillation (HCC)    ablation done 03/2006 at [facility]   Pneumonia    hx of 2004   PONV (postoperative nausea and vomiting)    Sleep apnea    Twitching    legs    Past Surgical History:  Procedure Laterality Date   [facility]RI[facility]  [facility]   CARDIOVERSION  08/11/2011   Procedure: CARDIOVERSION;  Surgeon:  Lewayne Bunting, MD;  Location: [facility] OR;  Service: Cardiovascular;  Laterality: N/A;   CARDIOVERSION N/A 04/16/2017   Procedure: CARDIOVERSION;  Surgeon: Thurmon Fair, MD;  [facility] ENDOSCOPY;  Service: Cardiovascular;  Laterality: N/A;   CARDIOVERSION N/A 03/04/2019   Procedure: CARDIOVERSION;  Surgeon: Lewayne Bunting, MD;  Location: [facility] ENDOSCOPY;  Service: Cardiovascular;  Laterality: N/A;   CARDIOVERSION N/A 07/04/2019   Procedure: CARDIOVERSION;  Surgeon: Chilton Si, MD;  Location: [facility] ENDOSCOPY;  Service: Cardiovascular;  Laterality: N/A;   EYE SURGERY     lasik, 1998   HERNIA REPAIR     1977   INGUINAL HERNIA REPAIR Right 08/26/2012   Procedure: LAPAROSCOPIC INGUINAL HERNIA;  Surgeon: Lodema Pilot, DO;  Location: [facility] OR;  Service: General;  Laterality: Right;  laparoscopic right inguinal hernia repair with mesh, umbilical hernia repair   INSERTION OF MESH Right 08/26/2012   Procedure: INSERTION OF MESH;  Surgeon: Lodema Pilot, DO;  Location: [facility] OR;  Service: General;  Laterality: Right;  right inguinal hernia   IR FLUORO GUIDE CV LINE RIGHT  08/15/2022   IR US GUIDE VASC ACCESS RIGHT  08/15/2022   TEE WITHOUT CARDIOVERSION N/A 03/04/2019   Procedure: TRANSESOPHAGEAL ECHOCARDIOGRAM (TEE);  Surgeon: [name]  S, MD;  Location: [facility] ENDOSCOPY;  Service: Cardiovascular;  Laterality: N/A;   UMBILICAL HERNIA REPAIR N/A 08/26/2012   Procedure: HERNIA REPAIR UMBILICAL ADULT;  Surgeon: Lodema Pilot, DO;  Location: [facility] OR;  Service: General;  Laterality: N/A;    Allergies  Allergen Reactions   Baclofen Other (See Comments)    Severe dizziness   Doxycycline Hyclate Other (See Comments)    Flu-like symptoms   Gabapentin Swelling and Other (See Comments)    Swelling in feet   Hycodan [Hydrocodone Bit-Homatrop Mbr] Other (See Comments)    Dizziness    Hydrocodone-Acetaminophen Other (See Comments)    GI upset, Dizziness   Pregabalin Swelling and Other (See Comments)    Severly  dry mouth    ROS -negative except as stated in HPI   Physical Exam: There were no vitals filed for this visit.  General: The patient is alert and oriented x3 in no acute distress.  Dermatology: Skin is warm, dry and supple bilateral lower extremities. Interspaces are clear of maceration and debris.  Nails 1 through 5 bilaterally are notable for increased thickness, length, brittle yellow discoloration consistent with fungal infection. They are tender on direct dorsal palpation.  Vascular: Faintly pedal pulses bilaterally secondary to edema. Capillary refill within normal limits.  +1 to +2 pitting edema.  Mild rubor present to the bilateral pretibial regions without significant hemosiderin deposition.  Diminished pedal hair growth.  Neurological: Light touch sensation grossly diminished bilateral feet.  Protective sensation diminished to the forefoot and toes with Semmes Weinstein monofilament, present at 6/10 sites bilaterally.  Musculoskeletal Exam: No pedal deformities noted   Assessment/Plan of Care: 1. Pain due to onychomycosis of toenails of both feet   2. Chronic anticoagulation   3. Neuropathy      Meds ordered this encounter  Medications   lidocaine (LIDODERM) 5 %    Sig: Place 2 patches onto the skin daily. Remove & Discard patch within 12 hours or as directed by MD    Dispense:  60 patch    Refill:  2   None  Discussed clinical findings with patient today.  Patient qualifies for at risk footcare due to peripheral neuropathy and chronic anticoagulation therapy  Pain due to onychomycosis of toenails: -Nails x 10 were debrided in thickness and length using aseptic nail nippers without incident.  Mechanical bur was used to thin down the nails.  Peripheral neuropathy: - Currently taking gabapentin 300 mg 3 times daily from another physician -Did discuss that we can try other pharmacological options if he wishes.  Prescribing lidocaine patches for his toes for now,  patient states neuropathic pain affecting his sleep. - I certify that this diagnosis represents a distinct and separate diagnosis that requires evaluation and treatment separate from other procedures or diagnosis    -Follow-up in 3 months for routine footcare or sooner if new pedal complaints complaints arise.     Jahn Franchini L. Marchia Bond, AACFAS [facility]     [address] [place], [place] [address]                Office [phone]  Fax [phone]

## 2023-08-24 ENCOUNTER — Encounter: Payer: Self-pay | Admitting: Neurology

## 2023-08-24 ENCOUNTER — Ambulatory Visit (INDEPENDENT_AMBULATORY_CARE_PROVIDER_SITE_OTHER): Payer: Medicare Other | Admitting: Neurology

## 2023-08-24 VITALS — BP 127/63 | HR 76 | Ht 76.0 in | Wt 291.0 lb

## 2023-08-24 DIAGNOSIS — N1831 Chronic kidney disease, stage 3a: Secondary | ICD-10-CM | POA: Diagnosis not present

## 2023-08-24 DIAGNOSIS — R202 Paresthesia of skin: Secondary | ICD-10-CM | POA: Diagnosis not present

## 2023-08-24 DIAGNOSIS — G2581 Restless legs syndrome: Secondary | ICD-10-CM

## 2023-08-24 MED ORDER — GABAPENTIN 100 MG PO CAPS: 300.0000 mg | ORAL_CAPSULE | Freq: Every day | ORAL | 11 refills | Status: DC

## 2023-08-24 NOTE — Progress Notes (Signed)
Chief Complaint  Patient presents with   New Patient (Initial Visit)    Rm15, wife present, Referral from [facility]/Brent Greenberg MD [phone]/atypical rapidly advancing neuropathy:bilateral feet soreness and achyness and numbness. Started 1 year ago. Pt wears compression socks daily 20-30 compression      ASSESSMENT AND PLAN  [name] is a 68 y.o. male   Peripheral neuropathy Restless leg syndrome  Mild length-dependent sensory changes, well-preserved strength  Laboratory evaluation to rule out treatable etiology  He is already on Requip 1 mg every night, add on low-dose gabapentin 100 mg up to 3 tablets every night  Follow up with PCP  DIAGNOSTIC DATA (LABS, IMAGING, TESTING) - I reviewed patient records, labs, notes, testing and imaging myself where available.   MEDICAL HISTORY:  [name] is a 68 year old male, seen in request by his primary care from [facility] Dr. Tiburcio Pea, Chrissie Noa for evaluation of bilateral lower extremity paresthesia, he is accompanied by his wife at today's visit on August 24, 2023  History is obtained from the patient and review of electronic medical records. I personally reviewed pertinent available imaging films in PACS.   PMHx of  A fib,  LE Edema, compression stock Chronic kidney disease, with acute worsening,  Prostate enlargement, s/p robotic ablation on August 2024,  Cellulitis of bilateral lower extremity, edema OSA-could not tolerate CPAP  Since 2022, he noticed numbness tingling starting at the toes, involving both side, he experienced worsening symptoms prior to his robotic water jet ablation of enlarged prostate in August 2024, at that time, he developed worsening lower extremity swelling, also worsening kidney function, hospital admission in February 2024 for worsening neck pain, increased the fatigue, creatinine of 10.08, obstructive uropathy required Foley catheter being placed,  He has developed  toxic metabolic encephalopathy due to uremia,  Following prostate surgery, his symptoms overall has much improved, so was bilateral lower extremity edema, lower extremity paresthesia has much improved as well now he is able to walk 1 and half mile daily without any difficulty, active in his daily activity  He denies significant low back pain, neck pain, no weakness, no gait abnormality  He does have some restless leg syndromes, urged to move his leg at evening or nighttime if he sits still, taking Requip 1 mg every night,  PHYSICAL EXAM:   Vitals:   08/24/23 1135  BP: 127/63  Pulse: 76  Weight: 291 lb (132 kg)  Height: 6\' 4"  (1.93 m)   Body mass index is 35.42 kg/m.  PHYSICAL EXAMNIATION:  Gen: NAD, conversant, well nourised, well groomed                     Cardiovascular: Regular rate rhythm, no peripheral edema, warm, nontender. Eyes: Conjunctivae clear without exudates or hemorrhage Neck: Supple, no carotid bruits. Pulmonary: Clear to auscultation bilaterally   NEUROLOGICAL EXAM:  MENTAL STATUS: Speech/cognition: Central obesity, awake, alert, oriented to history taking and casual conversation CRANIAL NERVES: CN II: Visual fields are full to confrontation. Pupils are round equal and briskly reactive to light. CN III, IV, VI: extraocular movement are normal. No ptosis. CN V: Facial sensation is intact to light touch CN VII: Face is symmetric with normal eye closure  CN VIII: Hearing is normal to causal conversation. CN IX, X: Phonation is normal. CN XI: Head turning and shoulder shrug are intact  MOTOR: There is no pronator drift of out-stretched arms. Muscle bulk and tone are normal. Muscle strength is  normal.  REFLEXES: Reflexes are 1 and symmetric at the biceps, triceps, knees, and absent at ankles. Plantar responses are flexor.  SENSORY: Intact to light touch, pinprick and vibratory sensation are intact in fingers and toes.  COORDINATION: There is no trunk  or limb dysmetria noted.  GAIT/STANCE: Posture is normal. Gait is steady with normal steps, base, arm swing, and turning. Heel and toe walking are normal.    REVIEW OF SYSTEMS:  Full 14 system review of systems performed and notable only for as above All other review of systems were negative.   ALLERGIES: Allergies  Allergen Reactions   Baclofen Other (See Comments)    Severe dizziness   Doxycycline Hyclate Other (See Comments)    Flu-like symptoms   Gabapentin Swelling and Other (See Comments)    Swelling in feet   Hycodan [Hydrocodone Bit-Homatrop Mbr] Other (See Comments)    Dizziness    Hydrocodone-Acetaminophen Other (See Comments)    GI upset, Dizziness   Pregabalin Swelling and Other (See Comments)    Severly dry mouth    HOME MEDICATIONS: Current Outpatient Medications  Medication Sig Dispense Refill   alfuzosin (UROXATRAL) 10 MG 24 hr tablet Take 10 mg by mouth daily with breakfast.     apixaban (ELIQUIS) 5 MG TABS tablet TAKE 1 TABLET BY MOUTH TWICE A DAY 60 tablet 5   clonazePAM (KLONOPIN) 0.5 MG tablet Take 0.25-0.5 mg by mouth at bedtime as needed for anxiety.     diltiazem (CARDIZEM CD) 360 MG 24 hr capsule TAKE 1 CAPSULE BY MOUTH EVERY DAY 90 capsule 3   finasteride (PROSCAR) 5 MG tablet Take 1 tablet (5 mg total) by mouth daily. 30 tablet 0   torsemide (DEMADEX) 20 MG tablet TAKE 40 MG DAILY. MAY TAKE AN ADDITIONAL TABLET ON THE DAYS OF WEIGHT GAIN OR SWELLING. 270 tablet 1   TYLENOL 500 MG tablet Take 1,500 mg by mouth as needed for mild pain or headache.     carvedilol (COREG) 25 MG tablet Take 1 tablet (25 mg total) by mouth 2 (two) times daily. (Patient taking differently: Take 25 mg by mouth daily.) 180 tablet 3   gabapentin (NEURONTIN) 100 MG capsule Take 1 capsule (100 mg total) by mouth at bedtime. 30 capsule 2   No current facility-administered medications for this visit.    PAST MEDICAL HISTORY: Past Medical History:  Diagnosis Date   Allergic  rhinitis    Anxiety    Asthma    as a child   Cellulitis    Chronic kidney disease    Chronic low back pain    Complication of anesthesia    "hard to put asleep, hard to wake up, nausea and vomiting"   Depression    Dysrhythmia    ED (erectile dysfunction)    GERD (gastroesophageal reflux disease)    HLD (hyperlipidemia)    HTN (hypertension)    sees Dr. Elias [facility], [facility]   Hypertrophy of prostate with urinary obstruction and other lower urinary tract symptoms (LUTS)    IBS (irritable bowel syndrome)    Inguinal hernia without mention of obstruction or gangrene, unilateral or unspecified, (not specified as recurrent)    Insomnia    Lumbar herniated disc    L7   Morbid obesity (HCC)    Narcotic addiction (HCC)    NICM (nonischemic cardiomyopathy) (HCC)    tachycardia mediated,  resolved with sinus   Persistent atrial fibrillation (HCC)    ablation done 03/2006 at [facility]  Pneumonia    hx of 2004   PONV (postoperative nausea and vomiting)    Sleep apnea    Twitching    legs    PAST SURGICAL HISTORY: Past Surgical History:  Procedure Laterality Date   ATRI[facility]  [facility]   CARDIOVERSION  08/11/2011   Procedure: CARDIOVERSION;  Surgeon: Lewayne Bunting, MD;  Location: [facility] OR;  Service: Cardiovascular;  Laterality: N/A;   CARDIOVERSION N/A 04/16/2017   Procedure: CARDIOVERSION;  Surgeon: Thurmon Fair, MD;  [facility] ENDOSCOPY;  Service: Cardiovascular;  Laterality: N/A;   CARDIOVERSION N/A 03/04/2019   Procedure: CARDIOVERSION;  Surgeon: Lewayne Bunting, MD;  Location: [facility] ENDOSCOPY;  Service: Cardiovascular;  Laterality: N/A;   CARDIOVERSION N/A 07/04/2019   Procedure: CARDIOVERSION;  Surgeon: Chilton Si, MD;  Location: [facility] ENDOSCOPY;  Service: Cardiovascular;  Laterality: N/A;   EYE SURGERY     lasik, 1998   HERNIA REPAIR     1977   INGUINAL HERNIA REPAIR Right 08/26/2012   Procedure: LAPAROSCOPIC INGUINAL HERNIA;  Surgeon:  Lodema Pilot, DO;  Location: [facility] OR;  Service: General;  Laterality: Right;  laparoscopic right inguinal hernia repair with mesh, umbilical hernia repair   INSERTION OF MESH Right 08/26/2012   Procedure: INSERTION OF MESH;  Surgeon: Lodema Pilot, DO;  Location: [facility] OR;  Service: General;  Laterality: Right;  right inguinal hernia   IR FLUORO GUIDE CV LINE RIGHT  08/15/2022   IR US GUIDE VASC ACCESS RIGHT  08/15/2022   TEE WITHOUT CARDIOVERSION N/A 03/04/2019   Procedure: TRANSESOPHAGEAL ECHOCARDIOGRAM (TEE);  Surgeon: Lewayne Bunting, MD;  Location: [facility] ENDOSCOPY;  Service: Cardiovascular;  Laterality: N/A;   UMBILICAL HERNIA REPAIR N/A 08/26/2012   Procedure: HERNIA REPAIR UMBILICAL ADULT;  Surgeon: Lodema Pilot, DO;  Location: [facility] OR;  Service: General;  Laterality: N/A;    FAMILY HISTORY: Family History  Problem Relation Age of Onset   Asthma Mother    Breast cancer Mother    Hypertension Mother    COPD Father    Prostate cancer Father    Hypertension Father    Lymphoma Sister     SOCIAL HISTORY: Social History   Socioeconomic History   Marital status: Married    Spouse name: Huel Centola   Number of children: 3   Years of education: Not on file   Highest education level: Bachelor's degree (e.g., BA, AB, BS)  Occupational History   Occupation: retired  Tobacco Use   Smoking status: Never   Smokeless tobacco: Never  Vaping Use   Vaping status: Never Used  Substance and Sexual Activity   Alcohol use: No    Alcohol/week: 0.0 standard drinks of alcohol   Drug use: No   Sexual activity: Yes  Other Topics Concern   Not on file  Social History Narrative   Lives in [place]   Social Drivers of Health   Financial Resource Strain: Low Risk  (04/17/2021)   Overall Financial Resource Strain (CARDIA)    Difficulty of Paying Living Expenses: Not hard at all  Food Insecurity: No Food Insecurity (08/18/2022)   Hunger Vital Sign    Worried About Running Out of Food in the Last  Year: Never true    Ran Out of Food in the Last Year: Never true  Transportation Needs: No Transportation Needs (08/18/2022)   PRAPARE - Administrator, Civil Service (Medical): No    Lack of Transportation (Non-Medical): No  Physical Activity: Not on file  Stress: Not on file  Social Connections: Not on file  Intimate Partner Violence: Not At Risk (08/18/2022)   Humiliation, Afraid, Rape, and Kick questionnaire    Fear of Current or Ex-Partner: No    Emotionally Abused: No    Physically Abused: No    Sexually Abused: No      Levert Feinstein, M.D. Ph.D.  [facility] [address] [place], [place] [address] Ph: [phone] Fax: [phone]  CC:  Coralie Carpen, MD [address] [place],  [place] [address]  Noberto Retort, MD

## 2023-08-25 ENCOUNTER — Encounter: Payer: Self-pay | Admitting: Neurology

## 2023-08-25 LAB — ANA W/REFLEX IF POSITIVE: Anti Nuclear Antibody (ANA): NEGATIVE

## 2023-08-25 LAB — FOLATE: Folate: 13.1 ng/mL (ref >3.0)

## 2023-08-25 LAB — FERRITIN: Ferritin: 138 ng/mL (ref 30–400)

## 2023-08-25 LAB — RPR: RPR Ser Ql: NONREACTIVE

## 2023-08-25 LAB — CK: Total CK: 156 U/L (ref 41–331)

## 2023-08-25 LAB — VITAMIN B12: Vitamin B-12: 567 pg/mL (ref 232–1245)

## 2023-08-25 LAB — HGB A1C W/O EAG: Hgb A1c MFr Bld: 5.9 % — ABNORMAL HIGH (ref 4.8–5.6)

## 2023-08-25 LAB — TSH: TSH: 1.99 u[IU]/mL (ref 0.450–4.500)

## 2023-08-25 NOTE — Telephone Encounter (Signed)
PA for lidocaine 5% patches was denied.It was denied because it is not being requested for postherpetic neuralgia, chronic back pain or diabetic neuropathic pain.

## 2023-09-03 ENCOUNTER — Encounter: Payer: Self-pay | Admitting: Cardiology

## 2023-09-03 ENCOUNTER — Telehealth: Payer: Self-pay | Admitting: Cardiology

## 2023-09-03 NOTE — Progress Notes (Unsigned)
 Cardiology Office Note    Patient Name: [name] Date of Encounter: 09/03/2023  Primary Care Provider:  Noberto Retort, MD Primary Cardiologist:  [name], MD Primary Electrophysiologist: None   Past Medical History    Past Medical History:  Diagnosis Date   Allergic rhinitis    Anxiety    Asthma    as a child   Cellulitis    Chronic kidney disease    Chronic low back pain    Complication of anesthesia    "hard to put asleep, hard to wake up, nausea and vomiting"   Depression    Dysrhythmia    ED (erectile dysfunction)    GERD (gastroesophageal reflux disease)    HLD (hyperlipidemia)    HTN (hypertension)    sees Dr. Elias [facility], [facility]   Hypertrophy of prostate with urinary obstruction and other lower urinary tract symptoms (LUTS)    IBS (irritable bowel syndrome)    Inguinal hernia without mention of obstruction or gangrene, unilateral or unspecified, (not specified as recurrent)    Insomnia    Lumbar herniated disc    L7   Morbid obesity (HCC)    Narcotic addiction (HCC)    NICM (nonischemic cardiomyopathy) (HCC)    tachycardia mediated,  resolved with sinus   Persistent atrial fibrillation (HCC)    ablation done 03/2006 at [facility]   Pneumonia    hx of 2004   PONV (postoperative nausea and vomiting)    Sleep apnea    Twitching    legs    History of Present Illness  [name] is a 68 y.o. male with a PMH of HFpEF, permanent AF, cardiomyopathy, HTN, HLD, CKD stage IV, chronic venous insufficiency, mild to moderate MVR who presents today with complaint of chest pain and elevated BP.  [name] has been followed by Dr. [name] since 2010 prior LHC in 2004 that showed reduced EF and normal coronaries.  02/2009 to [facility] with recurrent AF and 2D echo was completed showing reduced EF of 35-40% with biatrial enlargement.  He was seen in follow-up by Dr. Johney Frame and underwent PVI ablation but suffered recurrent AF reduced EF of 35  as 40% and was admitted to [facility] underwent TEE/DCCV.  He did well for several years but had recurrent AF in 02/2019 and underwent TEE/DCCV with recurrence of AF following procedure.  He had repeat DCCV and 06/2019 that was unsuccessful.  He was followed by Dr. Johney Frame and felt not to be a candidate for repeat ablation.  Evaluated by thoracic surgery for possible Maze procedure but medical therapy was elected.  He underwent a 2D echo on 03/2021 that showed normal EF with mild LVH and mild RV and mild LAE with mild to moderate MR.  He was seen in follow-up on 03/26/2022 and noted worsening dyspnea on exertion pedal edema and torsemide was increased to 60 mg twice daily with as needed 20 mg.  He was hospitalized on 02/2022 in the setting of acute on chronic CHF in the setting of urinary retention due to enlarged prostate.  He was readmitted on 08/2022 in the setting of AKI and CKD stage IV and found to have UTI with sepsis and uremia.  He was discharged with Foley catheter and improvement of renal function.  He was seen in follow-up on 11/04/2022 with complaint of shortness of breath and bilateral lower extremity edema.  He was noted to have a weight gain of 30 pounds over the last  2 months and torsemide was increased to 60 mg x 3 days followed by 40 mg daily and patient was seen for close follow-up with improvement to weight and shortness of breath.  Was noted to have elevated BP in the setting of not taking his medicine prior to visit.  He was last seen on 07/30/2023 by Bernadene Person, NP and reported doing well from a cardiac standpoint with stable nonpitting bilateral lower extremity edema present.  He reported improvement with elevation, diuretics and compression stockings.  He was found to have stable weights and was suffering a GI virus but recovering slowly.  His blood pressure was stable and no changes made to medications at that time.  He contacted our office on 09/03/2023 plaint of chest pain and arm pain  BP.      During today's visit the patient reports*** .  Patient denies chest pain, palpitations, dyspnea, PND, orthopnea, nausea, vomiting, dizziness, syncope, edema, weight gain, or early satiety.  ***Notes: -Last ischemic evaluation: -Last echo: -Interim ED visits: Review of Systems  Please see the history of present illness.    All other systems reviewed and are otherwise negative except as noted above.  Physical Exam    Wt Readings from Last 3 Encounters:  08/24/23 291 lb (132 kg)  07/30/23 288 lb (130.6 kg)  04/23/23 284 lb 9.6 oz (129.1 kg)   NW:GNFAO were no vitals filed for this visit.,There is no height or weight on file to calculate BMI. GEN: Well nourished, well developed in no acute distress Neck: No JVD; No carotid bruits Pulmonary: Clear to auscultation without rales, wheezing or rhonchi  Cardiovascular: Normal rate. Regular rhythm. Normal S1. Normal S2.   Murmurs: There is no murmur.  ABDOMEN: Soft, non-tender, non-distended EXTREMITIES:  No edema; No deformity   EKG/LABS/ Recent Cardiac Studies   ECG personally reviewed by me today - ***  Risk Assessment/Calculations:   {Does this patient have ATRIAL FIBRILLATION?:[phone]}  STOP-Bang Score:     { Consider Dx Sleep Disordered Breathing or Sleep Apnea  ICD G47.33          :1}    Lab Results  Component Value Date   WBC 6.3 07/30/2023   HGB 12.6 (L) 07/30/2023   HCT 40.5 07/30/2023   MCV 91 07/30/2023   PLT 298 07/30/2023   Lab Results  Component Value Date   CREATININE 1.79 (H) 07/30/2023   BUN 19 07/30/2023   NA 136 07/30/2023   K 5.0 07/30/2023   CL 104 07/30/2023   CO2 21 07/30/2023   Lab Results  Component Value Date   CHOL  02/14/2009    126        ATP III CLASSIFICATION:  <200     mg/dL   Desirable  130-865  mg/dL   Borderline High  >=784    mg/dL   High          HDL 48 02/14/2009   LDLCALC  02/14/2009    59        Total Cholesterol/HDL:CHD Risk Coronary Heart Disease Risk  Table                     Men   Women  1/2 Average Risk   3.4   3.3  Average Risk       5.0   4.4  2 X Average Risk   9.6   7.1  3 X Average Risk  23.4   11.0  Use the calculated Patient Ratio above and the CHD Risk Table to determine the patient's CHD Risk.        ATP III CLASSIFICATION (LDL):  <100     mg/dL   Optimal  010-272  mg/dL   Near or Above                    Optimal  130-159  mg/dL   Borderline  536-644  mg/dL   High  >034     mg/dL   Very High   TRIG 95 74/25/9563   CHOLHDL 2.6 02/14/2009    Lab Results  Component Value Date   HGBA1C 5.9 (H) 08/24/2023   Assessment & Plan    1.  Chest pain  2.  HFpEF/DCM:  3.  Permanent AF  4.  Primary HTN  5.  Nonrheumatic MVR  6.  CKD stage III/IV:      Disposition: Follow-up with [name], MD or APP in *** months {Are you ordering a CV Procedure (e.g. stress test, cath, DCCV, TEE, etc)?   Press F2        :[id number]}   Signed, Napoleon Form, Leodis Rains, NP 09/03/2023, 10:53 AM [facility]

## 2023-09-03 NOTE — Telephone Encounter (Signed)
 Pt c/o of Chest Pain: STAT if CP now or developed within 24 hours  1. Are you having CP right now? no  2. Are you experiencing any other symptoms (ex. SOB, nausea, vomiting, sweating)? Pain in the chest and arms   3. How long have you been experiencing CP? Couple days  4. Is your CP continuous or coming and going? Coming and going   5. Have you taken Nitroglycerin? No   Schd patient with Alden Server tomorrow 2/28 ?

## 2023-09-03 NOTE — Telephone Encounter (Signed)
 RN spoke to DOD Dr. Bjorn Pippin  Dr. Bjorn Pippin states:   -if patient is without symptoms, no need to recheck BP, monitor and follow up at 2/28 OV   -if patient has symptoms of chest pain, arm pain or any concerning symptoms present to ED     Mychart message sent

## 2023-09-03 NOTE — Telephone Encounter (Signed)
 Patient identification verified by 2 forms. Marilynn Rail, RN    Called and spoke to patient  Patient states:   -he is not having chest pain at this time -for a week now, he has developed mind chest pain and headaches when BP is elevated    -he also has pain in his arm when BP is high   -due to symptoms and high BP he has been taking extra doses of carvedilol Rx   -extra doses have decreased BP and helped to resolve symptoms   -This morning BP was 160/101, had some chest pain and arm pain, took 1.5 tablet carvedilol   -repeat BP this morning was 138/82  -he feels fine at this time   -symptoms only happen when BP is elevated Patient denies:   -visual changes/disturbances  Advised patient to keep 2/28 OV with NP  Advised patient to monitor symptoms at home Reviewed ED warning signs/precautions (patient did state he is not interested in presenting to ED)  Patient verbalized understanding, no questions

## 2023-09-03 NOTE — Telephone Encounter (Signed)
 Please see documentation in 2/27 mychart message

## 2023-09-04 ENCOUNTER — Ambulatory Visit: Payer: Medicare Other | Attending: Nurse Practitioner | Admitting: Nurse Practitioner

## 2023-09-04 ENCOUNTER — Encounter: Payer: Self-pay | Admitting: Nurse Practitioner

## 2023-09-04 VITALS — BP 124/82 | HR 52 | Ht 76.0 in | Wt 302.0 lb

## 2023-09-04 DIAGNOSIS — I5032 Chronic diastolic (congestive) heart failure: Secondary | ICD-10-CM | POA: Insufficient documentation

## 2023-09-04 DIAGNOSIS — I4821 Permanent atrial fibrillation: Secondary | ICD-10-CM | POA: Insufficient documentation

## 2023-09-04 DIAGNOSIS — I340 Nonrheumatic mitral (valve) insufficiency: Secondary | ICD-10-CM | POA: Diagnosis present

## 2023-09-04 DIAGNOSIS — I10 Essential (primary) hypertension: Secondary | ICD-10-CM | POA: Insufficient documentation

## 2023-09-04 DIAGNOSIS — R079 Chest pain, unspecified: Secondary | ICD-10-CM | POA: Insufficient documentation

## 2023-09-04 DIAGNOSIS — I420 Dilated cardiomyopathy: Secondary | ICD-10-CM | POA: Diagnosis present

## 2023-09-04 NOTE — Patient Instructions (Signed)
 Medication Instructions:  Your physician recommends that you continue on your current medications as directed. Please refer to the Current Medication list given to you today,   Change the time you take the Cardizem, 12:00  Take a extra Torsemide for 3 days then go back to the original dose *If you need a refill on your cardiac medications before your next appointment , please call your pharmacy*   Lab Work: None ordered  If you have labs (blood work) drawn today and your tests are completely normal, you will receive your results only by: MyChart Message (if you have MyChart) OR A paper copy in the mail If you have any lab test that is abnormal or we need to change your treatment, we will call you to review the results.   Testing/Procedures: None ordered   Follow-Up: At [facility], you and your health needs are our priority.  As part of our continuing mission to provide you with exceptional heart care, we have created designated Provider Care Teams.  These Care Teams include your primary Cardiologist (physician) and Advanced Practice Providers (APPs -  Physician Assistants and Nurse Practitioners) who all work together to provide you with the care you need, when you need it.  We recommend signing up for the patient portal called "MyChart".  Sign up information is provided on this After Visit Summary.  MyChart is used to connect with patients for Virtual Visits (Telemedicine).  Patients are able to view lab/test results, encounter notes, upcoming appointments, etc.  Non-urgent messages can be sent to your provider as well.   To learn more about what you can do with MyChart, go to [email or web address].    Your next appointment:   3 month(s)  Provider:   Bernadene Person, NP        Other Instructions Your physician has requested that you regularly monitor and record your blood pressure readings at home. Please use the same machine at the same time of day to check your readings  and record them to bring to your follow-up visit.   Please monitor blood pressures and keep a log of your readings 2 weeks then send them via mychart.    Make sure to check 2 hours after your medications.    AVOID these things for 30 minutes before checking your blood pressure: No Drinking caffeine. No Drinking alcohol. No Eating. No Smoking. No Exercising.   Five minutes before checking your blood pressure: Pee. Sit in a dining chair. Avoid sitting in a soft couch or armchair. Be quiet. Do not talk          1st Floor: - Lobby - Registration  - Pharmacy  - Lab - Cafe  2nd Floor: - PV Lab - Diagnostic Testing (echo, CT, nuclear med)  3rd Floor: - Vacant  4th Floor: - TCTS (cardiothoracic surgery) - AFib Clinic - Structural Heart Clinic - Vascular Surgery  - Vascular Ultrasound  5th Floor: - HeartCare Cardiology (general and EP) - Clinical Pharmacy for coumadin, hypertension, lipid, weight-loss medications, and med management appointments    Valet parking services will be available as well.

## 2023-09-23 ENCOUNTER — Encounter: Payer: Self-pay | Admitting: Cardiology

## 2023-09-23 NOTE — Telephone Encounter (Signed)
 Attempted to call patient, no answer mailbox full.

## 2023-09-24 ENCOUNTER — Encounter: Payer: Self-pay | Admitting: Cardiology

## 2023-09-24 ENCOUNTER — Other Ambulatory Visit (HOSPITAL_BASED_OUTPATIENT_CLINIC_OR_DEPARTMENT_OTHER): Payer: Self-pay

## 2023-09-24 NOTE — Telephone Encounter (Signed)
 Patient identification verified by 2 forms. Marilynn Rail, RN    Called and spoke to patient  Patient states:   -his BP and HR have not been great   -at night and sometimes during the day Hr plummets   -this morning at 5am his HR was 57 (this is too low for him)   -when heart rate decreases to the 50s he becomes SOB and legs feel heavy   -this morning his BP was 134/90 Patient scheduled for OV 3/27 at 8:25am  Reviewed ED warning signs/precautions  Patient verbalized understanding, no questions at this time

## 2023-09-29 NOTE — Progress Notes (Unsigned)
 HPI: FU atrial fibrillation. The patient had a cardiac catheterization in 2004 that showed an ejection fraction of 45% and normal coronary arteries. He did have atrial fibrillation at that time and his LV function improved after sinus rhythm was restored. He ultimately had atrial fibrillation ablation. He did well for several years but then recurrent atrial fibrillation. Transesophageal echocardiogram August 2020 showed normal LV function, severe left atrial enlargement, moderate to severe mitral regurgitation and mild tricuspid regurgitation. Patient had cardioversion March 04, 2019 but atrial fibrillation recurred. Patient seen by Dr. Johney Frame and repeat ablation felt not indicated as chances of maintaining sinus would be lower given obesity and severe left atrial enlargement.  Plan was to potentially consider referral for surgical Maze procedure and mitral valve repair. Follow-up transthoracic echocardiogram October 2020 showed normal LV function, moderate left atrial enlargement and trace mitral regurgitation. Patient subsequently placed on amiodarone in the atrial fibrillation clinic. Also referred for evaluation of sleep apnea. Repeat attempt at cardioversion December 2020 unsuccessful. Dr. Cornelius Moras evaluated patient for consideration of surgical maze procedure and felt that medical therapy was best option. Echocardiogram October 2023 showed normal LV function, mild left ventricular hypertrophy, mild left atrial enlargement, mild mitral regurgitation.  Venous Dopplers November 2023 showed no DVT.  Since last seen, he has no significant dyspnea, chest pain or syncope.  Chronic mild pedal edema.  He has noticed that his heart rate decreases into the 50s on both carvedilol and Cardizem.  When this happens he becomes more dyspneic.  He discontinued his Cardizem 2 days ago.  Current Outpatient Medications  Medication Sig Dispense Refill   alfuzosin (UROXATRAL) 10 MG 24 hr tablet Take 10 mg by mouth daily  with breakfast.     apixaban (ELIQUIS) 5 MG TABS tablet TAKE 1 TABLET BY MOUTH TWICE A DAY 60 tablet 5   clonazePAM (KLONOPIN) 0.5 MG tablet Take 0.25-0.5 mg by mouth at bedtime as needed for anxiety.     finasteride (PROSCAR) 5 MG tablet Take 1 tablet (5 mg total) by mouth daily. 30 tablet 0   gabapentin (NEURONTIN) 100 MG capsule Take 3 capsules (300 mg total) by mouth at bedtime. 90 capsule 11   torsemide (DEMADEX) 20 MG tablet TAKE 40 MG DAILY. MAY TAKE AN ADDITIONAL TABLET ON THE DAYS OF WEIGHT GAIN OR SWELLING. 270 tablet 1   traMADol (ULTRAM) 50 MG tablet Take 50 mg by mouth daily as needed.     TYLENOL 500 MG tablet Take 1,500 mg by mouth as needed for mild pain or headache.     carvedilol (COREG) 25 MG tablet Take 1 tablet (25 mg total) by mouth 2 (two) times daily. (Patient taking differently: Take 25 mg by mouth daily.) 180 tablet 3   diltiazem (CARDIZEM CD) 360 MG 24 hr capsule TAKE 1 CAPSULE BY MOUTH EVERY DAY (Patient not taking: Reported on 09/30/2023) 90 capsule 3   gabapentin (NEURONTIN) 100 MG capsule Take 1 capsule (100 mg total) by mouth at bedtime. 30 capsule 2   No current facility-administered medications for this visit.     Past Medical History:  Diagnosis Date   Allergic rhinitis    Anxiety    Asthma    as a child   Cellulitis    Chronic kidney disease    Chronic low back pain    Complication of anesthesia    "hard to put asleep, hard to wake up, nausea and vomiting"   Depression    Dysrhythmia  ED (erectile dysfunction)    GERD (gastroesophageal reflux disease)    HLD (hyperlipidemia)    HTN (hypertension)    sees Dr. Elias Else, [facility]   Hypertrophy of prostate with urinary obstruction and other lower urinary tract symptoms (LUTS)    IBS (irritable bowel syndrome)    Inguinal hernia without mention of obstruction or gangrene, unilateral or unspecified, (not specified as recurrent)    Insomnia    Lumbar herniated disc    L7   Morbid  obesity (HCC)    Narcotic addiction (HCC)    NICM (nonischemic cardiomyopathy) (HCC)    tachycardia mediated,  resolved with sinus   Persistent atrial fibrillation (HCC)    ablation done 03/2006 at [facility]   Pneumonia    hx of 2004   PONV (postoperative nausea and vomiting)    Sleep apnea    Twitching    legs    Past Surgical History:  Procedure Laterality Date   ATRIAL ABLATION SURGERY  2007   [facility]   CARDIOVERSION  08/11/2011   Procedure: CARDIOVERSION;  Surgeon: Lewayne Bunting, MD;  Location: [facility] OR;  Service: Cardiovascular;  Laterality: N/A;   CARDIOVERSION N/A 04/16/2017   Procedure: CARDIOVERSION;  Surgeon: Thurmon Fair, MD;  Location: [facility] ENDOSCOPY;  Service: Cardiovascular;  Laterality: N/A;   CARDIOVERSION N/A 03/04/2019   Procedure: CARDIOVERSION;  Surgeon: Lewayne Bunting, MD;  Location: [facility] ENDOSCOPY;  Service: Cardiovascular;  Laterality: N/A;   CARDIOVERSION N/A 07/04/2019   Procedure: CARDIOVERSION;  Surgeon: Chilton Si, MD;  Location: [facility] ENDOSCOPY;  Service: Cardiovascular;  Laterality: N/A;   EYE SURGERY     lasik, 1998   HERNIA REPAIR     1977   INGUINAL HERNIA REPAIR Right 08/26/2012   Procedure: LAPAROSCOPIC INGUINAL HERNIA;  Surgeon: Lodema Pilot, DO;  Location: [facility] OR;  Service: General;  Laterality: Right;  laparoscopic right inguinal hernia repair with mesh, umbilical hernia repair   INSERTION OF MESH Right 08/26/2012   Procedure: INSERTION OF MESH;  Surgeon: Lodema Pilot, DO;  Location: [facility] OR;  Service: General;  Laterality: Right;  right inguinal hernia   IR FLUORO GUIDE CV LINE RIGHT  08/15/2022   IR US GUIDE VASC ACCESS RIGHT  08/15/2022   TEE WITHOUT CARDIOVERSION N/A 03/04/2019   Procedure: TRANSESOPHAGEAL ECHOCARDIOGRAM (TEE);  Surgeon: Lewayne Bunting, MD;  Location: [facility] ENDOSCOPY;  Service: Cardiovascular;  Laterality: N/A;   UMBILICAL HERNIA REPAIR N/A 08/26/2012   Procedure: HERNIA REPAIR UMBILICAL ADULT;  Surgeon: Lodema Pilot, DO;  Location: [facility]  OR;  Service: General;  Laterality: N/A;    Social History   Socioeconomic History   Marital status: Married    Spouse name: Thatcher Doberstein   Number of children: 3   Years of education: Not on file   Highest education level: Bachelor's degree (e.g., BA, AB, BS)  Occupational History   Occupation: retired  Tobacco Use   Smoking status: Never   Smokeless tobacco: Never  Vaping Use   Vaping status: Never Used  Substance and Sexual Activity   Alcohol use: No    Alcohol/week: 0.0 standard drinks of alcohol   Drug use: No   Sexual activity: Yes  Other Topics Concern   Not on file  Social History Narrative   Lives in [place]   Social Drivers of Health   Financial Resource Strain: Low Risk  (04/17/2021)   Overall Financial Resource Strain (CARDIA)    Difficulty of Paying Living Expenses: Not hard at all  Food  Insecurity: No Food Insecurity (08/18/2022)   Hunger Vital Sign    Worried About Running Out of Food in the Last Year: Never true    Ran Out of Food in the Last Year: Never true  Transportation Needs: No Transportation Needs (08/18/2022)   PRAPARE - Administrator, Civil Service (Medical): No    Lack of Transportation (Non-Medical): No  Physical Activity: Not on file  Stress: Not on file  Social Connections: Not on file  Intimate Partner Violence: Not At Risk (08/18/2022)   Humiliation, Afraid, Rape, and Kick questionnaire    Fear of Current or Ex-Partner: No    Emotionally Abused: No    Physically Abused: No    Sexually Abused: No    Family History  Problem Relation Age of Onset   Asthma Mother    Breast cancer Mother    Hypertension Mother    COPD Father    Prostate cancer Father    Hypertension Father    Lymphoma Sister     ROS: Peripheral neuropathy and arthralgias but no fevers or chills, productive cough, hemoptysis, dysphasia, odynophagia, melena, hematochezia, dysuria, hematuria, rash, seizure activity, orthopnea, PND, pedal edema,  claudication. Remaining systems are negative.  Physical Exam: Well-developed obese in no acute distress.  Skin is warm and dry.  HEENT is normal.  Neck is supple.  Chest is clear to auscultation with normal expansion.  Cardiovascular exam is irregular Abdominal exam nontender or distended. No masses palpated. Extremities show trace to 1+ edema. neuro grossly intact  EKG Interpretation Date/Time:  Wednesday September 30 2023 10:59:06 EDT Ventricular Rate:  69 PR Interval:    QRS Duration:  100 QT Interval:  420 QTC Calculation: 450 R Axis:   71  Text Interpretation: Atrial fibrillation Incomplete right bundle branch block When compared with ECG of 30-Jul-2023 08:07, No significant change was found Confirmed by [name] ([id number]) on 09/30/2023 11:25:47 AM    A/P  1 permanent atrial fibrillation-patient states that his heart rate is decreasing on both Cardizem and carvedilol.  It is now controlled on carvedilol alone.  I will arrange a 3-day Zio patch to further assess heart rate.  Continue off of Cardizem.  Continue apixaban.  2 chronic diastolic congestive heart failure-he is reasonly well compensated at present.  Will continue diuretic at present dose.  3 hypertension-given that we are discontinuing diltiazem we will add amlodipine 5 mg daily and follow blood pressure.  4 mitral regurgitation-mild on most recent echocardiogram.  5 history of cardiomyopathy-LV function has improved on most recent echocardiogram.  6 obesity-we discussed the importance of weight loss.  7 chronic stage III kidney disease-check potassium and renal function.  [name], MD

## 2023-09-30 ENCOUNTER — Ambulatory Visit: Attending: Cardiology | Admitting: Cardiology

## 2023-09-30 ENCOUNTER — Ambulatory Visit

## 2023-09-30 ENCOUNTER — Encounter: Payer: Self-pay | Admitting: Cardiology

## 2023-09-30 VITALS — BP 128/78 | HR 69 | Ht 76.0 in | Wt 305.0 lb

## 2023-09-30 DIAGNOSIS — I420 Dilated cardiomyopathy: Secondary | ICD-10-CM

## 2023-09-30 DIAGNOSIS — I4821 Permanent atrial fibrillation: Secondary | ICD-10-CM

## 2023-09-30 DIAGNOSIS — I10 Essential (primary) hypertension: Secondary | ICD-10-CM

## 2023-09-30 DIAGNOSIS — I5032 Chronic diastolic (congestive) heart failure: Secondary | ICD-10-CM | POA: Diagnosis not present

## 2023-09-30 MED ORDER — AMLODIPINE BESYLATE 5 MG PO TABS: 5.0000 mg | ORAL_TABLET | Freq: Every day | ORAL | 3 refills | Status: DC

## 2023-09-30 NOTE — Patient Instructions (Signed)
 Medication Instructions:   STOP DILTIAZEM  START AMLODIPINE 5 MG ONCE DAILY  *If you need a refill on your cardiac medications before your next appointment, please call your pharmacy   Testing/Procedures:  ZIO XT- Long Term Monitor Instructions  Your physician has requested you wear a ZIO patch monitor for 3 days.  This is a single patch monitor. [employer] supplies one patch monitor per enrollment. Additional stickers are not available. Please do not apply patch if you will be having a Nuclear Stress Test,  Echocardiogram, Cardiac CT, MRI, or Chest Xray during the period you would be wearing the  monitor. The patch cannot be worn during these tests. You cannot remove and re-apply the  ZIO XT patch monitor.  Your ZIO patch monitor will be mailed 3 day USPS to your address on file. It may take 3-5 days  to receive your monitor after you have been enrolled.  Once you have received your monitor, please review the enclosed instructions. Your monitor  has already been registered assigning a specific monitor serial # to you.  Billing and Patient Assistance Program Information  We have supplied [employer] with any of your insurance information on file for billing purposes. [employer] offers a sliding scale Patient Assistance Program for patients that do not have  insurance, or whose insurance does not completely cover the cost of the ZIO monitor.  You must apply for the Patient Assistance Program to qualify for this discounted rate.  To apply, please call [employer] at [phone], select option 4, select option 2, ask to apply for  Patient Assistance Program. Meredeth Ide will ask your household income, and how many people  are in your household. They will quote your out-of-pocket cost based on that information.  [employer] will also be able to set up a 25-month, interest-free payment plan if needed.  Applying the monitor   Shave hair from upper left chest.  Hold abrader disc by orange tab. Rub abrader  in 40 strokes over the upper left chest as  indicated in your monitor instructions.  Clean area with 4 enclosed alcohol pads. Let dry.  Apply patch as indicated in monitor instructions. Patch will be placed under collarbone on left  side of chest with arrow pointing upward.  Rub patch adhesive wings for 2 minutes. Remove white label marked "1". Remove the white  label marked "2". Rub patch adhesive wings for 2 additional minutes.  While looking in a mirror, press and release button in center of patch. A small green light will  flash 3-4 times. This will be your only indicator that the monitor has been turned on.  Do not shower for the first 24 hours. You may shower after the first 24 hours.  Press the button if you feel a symptom. You will hear a small click. Record Date, Time and  Symptom in the Patient Logbook.  When you are ready to remove the patch, follow instructions on the last 2 pages of Patient  Logbook. Stick patch monitor onto the last page of Patient Logbook.  Place Patient Logbook in the blue and white box. Use locking tab on box and tape box closed  securely. The blue and white box has prepaid postage on it. Please place it in the mailbox as  soon as possible. Your physician should have your test results approximately 7 days after the  monitor has been mailed back to [facility].  Call [facility] Customer Care at [phone] if you have questions regarding  your ZIO XT patch  monitor. Call them immediately if you see an orange light blinking on your  monitor.  If your monitor falls off in less than 4 days, contact our Monitor department at [phone].  If your monitor becomes loose or falls off after 4 days call [employer] at [phone] for  suggestions on securing your monitor    Follow-Up: At [facility], you and your health needs are our priority.  As part of our continuing mission to provide you with exceptional heart care, we have created designated  Provider Care Teams.  These Care Teams include your primary Cardiologist (physician) and Advanced Practice Providers (APPs -  Physician Assistants and Nurse Practitioners) who all work together to provide you with the care you need, when you need it.  We recommend signing up for the patient portal called "MyChart".  Sign up information is provided on this After Visit Summary.  MyChart is used to connect with patients for Virtual Visits (Telemedicine).  Patients are able to view lab/test results, encounter notes, upcoming appointments, etc.  Non-urgent messages can be sent to your provider as well.   To learn more about what you can do with MyChart, go to [email or web address].    Your next appointment:   3 month(s)  Provider:   [name], MD

## 2023-09-30 NOTE — Progress Notes (Unsigned)
 Enrolled for [employer] to mail a ZIO XT long term holter monitor to the patients address on file.

## 2023-10-01 ENCOUNTER — Ambulatory Visit: Admitting: Cardiology

## 2023-10-23 ENCOUNTER — Encounter: Payer: Self-pay | Admitting: Cardiology

## 2023-10-23 MED ORDER — TORSEMIDE 20 MG PO TABS: ORAL_TABLET | ORAL | 2 refills | Status: DC

## 2023-10-26 ENCOUNTER — Encounter (HOSPITAL_BASED_OUTPATIENT_CLINIC_OR_DEPARTMENT_OTHER): Attending: General Surgery | Admitting: General Surgery

## 2023-10-26 DIAGNOSIS — G9009 Other idiopathic peripheral autonomic neuropathy: Secondary | ICD-10-CM | POA: Insufficient documentation

## 2023-10-26 DIAGNOSIS — I872 Venous insufficiency (chronic) (peripheral): Secondary | ICD-10-CM | POA: Insufficient documentation

## 2023-10-26 DIAGNOSIS — I890 Lymphedema, not elsewhere classified: Secondary | ICD-10-CM | POA: Diagnosis not present

## 2023-11-02 ENCOUNTER — Ambulatory Visit: Payer: Medicare Other | Admitting: Nurse Practitioner

## 2023-11-13 ENCOUNTER — Encounter: Payer: Self-pay | Admitting: Cardiology

## 2023-11-13 MED ORDER — AMLODIPINE BESYLATE 10 MG PO TABS: 10.0000 mg | ORAL_TABLET | Freq: Every day | ORAL | 3 refills | Status: DC

## 2023-11-17 ENCOUNTER — Other Ambulatory Visit: Payer: Self-pay | Admitting: *Deleted

## 2023-11-17 ENCOUNTER — Other Ambulatory Visit: Payer: Self-pay

## 2023-11-17 MED ORDER — CARVEDILOL 25 MG PO TABS: 25.0000 mg | ORAL_TABLET | Freq: Two times a day (BID) | ORAL | 4 refills | Status: DC

## 2023-11-17 NOTE — Progress Notes (Signed)
 Medication Coreg  25 mg twice a day  refill and sent to [facility] pharmacy per patient request.

## 2023-11-19 ENCOUNTER — Ambulatory Visit (INDEPENDENT_AMBULATORY_CARE_PROVIDER_SITE_OTHER): Payer: Medicare Other | Admitting: Podiatry

## 2023-11-19 ENCOUNTER — Telehealth: Payer: Self-pay

## 2023-11-19 ENCOUNTER — Encounter: Payer: Self-pay | Admitting: Podiatry

## 2023-11-19 DIAGNOSIS — Z7901 Long term (current) use of anticoagulants: Secondary | ICD-10-CM | POA: Diagnosis not present

## 2023-11-19 DIAGNOSIS — B351 Tinea unguium: Secondary | ICD-10-CM

## 2023-11-19 DIAGNOSIS — M79675 Pain in left toe(s): Secondary | ICD-10-CM | POA: Diagnosis not present

## 2023-11-19 DIAGNOSIS — M79674 Pain in right toe(s): Secondary | ICD-10-CM | POA: Diagnosis not present

## 2023-11-19 DIAGNOSIS — G629 Polyneuropathy, unspecified: Secondary | ICD-10-CM

## 2023-11-19 MED ORDER — LIDOCAINE 5 % EX PTCH
2.0000 | MEDICATED_PATCH | CUTANEOUS | 1 refills | Status: DC
Start: 2023-11-19 — End: 2024-02-09

## 2023-11-19 NOTE — Telephone Encounter (Signed)
 PA request received from covermymeds for Lidocaine  5% patches. PA submitted and awaiting response.  [name]  (KeyKy Phillips) PA Case ID #: [id number] Rx #: [id number]

## 2023-11-19 NOTE — Progress Notes (Signed)
  Subjective:  Patient ID: Carlyle Childes, male    DOB: [birth year],  MRN: [id number]  Chief Complaint  Patient presents with   RFC    Routine toe nail trim. Non diabetic.    68 y.o. male presents with the above complaint. History confirmed with patient. Patient presenting with pain related to dystrophic thickened elongated nails. Patient is unable to trim own nails related to nail dystrophy and/or mobility issues.  Patient is on chronic anticoagulation therapy for A-fib.  He is currently on Eliquis .  He also deals with painful peripheral neuropathy.  Lidocaine  patches have not been very helpful for the patient and he has had to cut back and limited taking gabapentin  due to CKD.  Objective:  Physical Exam: warm, good capillary refill, pedal skin atrophic, atrophic skin to pretibial regions, chronic hemosiderin deposits consistent with venous disease, diminished pedal hair growth nail exam onychomycosis of the toenails, onycholysis, and dystrophic nails DP pulses faintly palpable due to edema, PT pulses faintly palpable due to edema, and protective sensation diminished, present at 6/10 sites, vibratory sensation diminished.  Patient reports painful paresthesias. Left Foot:  Pain with palpation of nails due to elongation and dystrophic growth.  Right Foot: Pain with palpation of nails due to elongation and dystrophic growth.   Assessment:   1. Neuropathy   2. Pain due to onychomycosis of toenails of both feet   3. Chronic anticoagulation      Plan:  Patient was evaluated and treated and all questions answered.  # Peripheral neuropathy - Medication options are limited due to patient's CKD - Prescribing lidocaine  patches which may provide some short-term relief - Patient reports that pain is limiting his activity and affecting his quality life, currently affects his sleep. - Discussed potential treatment options with patient.  I am sending a referral to Dr. Katheryn Pandy at [facility]  Neurosurgery.  Patient potentially interested in spinal cord stimulation if he is an appropriate candidate. -I certify that this diagnosis represents a distinct and separate diagnosis that requires evaluation and treatment separate from other procedures or diagnosis   #Onychomycosis with pain  -Nails palliatively debrided as below. -Educated on self-care - Chronically anticoagulated on Eliquis   Procedure: Nail Debridement Rationale: Pain Type of Debridement: manual, sharp debridement. Instrumentation: Nail nipper, rotary burr. Number of Nails: 10  Return in about 3 months (around 02/19/2024) for Routine Foot Care.         Eve Hinders, DPM [facility] / [facility]

## 2023-11-25 ENCOUNTER — Telehealth: Payer: Self-pay

## 2023-11-25 NOTE — Telephone Encounter (Signed)
 Referral, office note and demographics faxed to  Dr. Katheryn Pandy  [facility] Neurosurgery- [facility]  Patient Interested in Spinal Cord Stimulation  [address] [place]  Phone [phone]  Fax [phone]

## 2023-11-25 NOTE — Telephone Encounter (Signed)
-----   Message from Reina Cara sent at 11/19/2023 10:36 AM EDT ----- Regarding: External Neurosurgery Referral Hello,  I am placing an external neurosurgery referral for this patient for neuropathy for an evaluation of the spinal cord stimulation.  Dr. Katheryn Pandy [facility]- [facility] Patient Interested in Spinal Cord Stimulation [address] [place] [phone]

## 2023-11-26 ENCOUNTER — Ambulatory Visit (HOSPITAL_BASED_OUTPATIENT_CLINIC_OR_DEPARTMENT_OTHER): Admitting: General Surgery

## 2023-11-27 NOTE — Progress Notes (Signed)
 HPI: FU atrial fibrillation and CHF. The patient had a cardiac catheterization in 2004 that showed an ejection fraction of 45% and normal coronary arteries. He did have atrial fibrillation at that time and his LV function improved after sinus rhythm was restored. He ultimately had atrial fibrillation ablation. He did well for several years but then recurrent atrial fibrillation. Transesophageal echocardiogram August 2020 showed normal LV function, severe left atrial enlargement, moderate to severe mitral regurgitation and mild tricuspid regurgitation. Patient had cardioversion March 04, 2019 but atrial fibrillation recurred. Patient seen by Dr. Nunzio [name] and repeat ablation felt not indicated as chances of maintaining sinus would be lower given obesity and severe left atrial enlargement.  Plan was to potentially consider referral for surgical Maze procedure and mitral valve repair. Follow-up transthoracic echocardiogram October 2020 showed normal LV function, moderate left atrial enlargement and trace mitral regurgitation. Patient subsequently placed on amiodarone  in the atrial fibrillation clinic. Also referred for evaluation of sleep apnea. Repeat attempt at cardioversion December 2020 unsuccessful. Dr. Alva [name] evaluated patient for consideration of surgical maze procedure and felt that medical therapy was best option. Echocardiogram October 2023 showed normal LV function, mild left ventricular hypertrophy, mild left atrial enlargement, mild mitral regurgitation.  Venous Dopplers November 2023 showed no DVT.  Since last seen, patient denies dyspnea, chest pain, palpitations, syncope or bleeding.  His pedal edema is well-controlled.  Current Outpatient Medications  Medication Sig Dispense Refill   alfuzosin (UROXATRAL) 10 MG 24 hr tablet Take 10 mg by mouth daily with breakfast.     amLODipine  (NORVASC ) 10 MG tablet Take 1 tablet (10 mg total) by mouth daily. 30 tablet 3   apixaban  (ELIQUIS ) 5 MG TABS  tablet TAKE 1 TABLET BY MOUTH TWICE A DAY 60 tablet 5   carvedilol  (COREG ) 25 MG tablet Take 1 tablet (25 mg total) by mouth 2 (two) times daily. 90 tablet 4   clonazePAM (KLONOPIN) 0.5 MG tablet Take 0.25-0.5 mg by mouth at bedtime as needed for anxiety.     finasteride  (PROSCAR ) 5 MG tablet Take 1 tablet (5 mg total) by mouth daily. 30 tablet 0   gabapentin  (NEURONTIN ) 100 MG capsule Take 1 capsule (100 mg total) by mouth at bedtime. 30 capsule 2   gabapentin  (NEURONTIN ) 100 MG capsule Take 3 capsules (300 mg total) by mouth at bedtime. 90 capsule 11   lidocaine  (LIDODERM ) 5 % Place 2 patches onto the skin daily. Remove & Discard patch within 12 hours or as directed by MD 180 patch 1   torsemide  (DEMADEX ) 20 MG tablet Take 40 mg daily. May take an additional tablet on the days of weight gain or swelling. 270 tablet 2   traMADol  (ULTRAM ) 50 MG tablet Take 50 mg by mouth daily as needed.     TYLENOL  500 MG tablet Take 1,500 mg by mouth as needed for mild pain or headache.     No current facility-administered medications for this visit.     Past Medical History:  Diagnosis Date   Allergic rhinitis    Anxiety    Asthma    as a child   Cellulitis    Chronic kidney disease    Chronic low back pain    Complication of anesthesia    "hard to put asleep, hard to wake up, nausea and vomiting"   Depression    Dysrhythmia    ED (erectile dysfunction)    GERD (gastroesophageal reflux disease)    HLD (hyperlipidemia)  HTN (hypertension)    sees Dr. Vidal [name], [facility]   Hypertrophy of prostate with urinary obstruction and other lower urinary tract symptoms (LUTS)    IBS (irritable bowel syndrome)    Inguinal hernia without mention of obstruction or gangrene, unilateral or unspecified, (not specified as recurrent)    Insomnia    Lumbar herniated disc    L7   Morbid obesity (HCC)    Narcotic addiction (HCC)    NICM (nonischemic cardiomyopathy) (HCC)    tachycardia mediated,   resolved with sinus   Persistent atrial fibrillation (HCC)    ablation done 03/2006 at [facility]   Pneumonia    hx of 2004   PONV (postoperative nausea and vomiting)    Sleep apnea    Twitching    legs    Past Surgical History:  Procedure Laterality Date   ATRIAL ABLATION SURGERY  2007   [facility]   CARDIOVERSION  08/11/2011   Procedure: CARDIOVERSION;  Surgeon: Clinton Quince, MD;  Location: [facility] OR;  Service: Cardiovascular;  Laterality: N/A;   CARDIOVERSION N/A 04/16/2017   Procedure: CARDIOVERSION;  Surgeon: Clinton Rumple, MD;  Location: [facility] ENDOSCOPY;  Service: Cardiovascular;  Laterality: N/A;   CARDIOVERSION N/A 03/04/2019   Procedure: CARDIOVERSION;  Surgeon: Clinton Quince, MD;  Location: [facility] ENDOSCOPY;  Service: Cardiovascular;  Laterality: N/A;   CARDIOVERSION N/A 07/04/2019   Procedure: CARDIOVERSION;  Surgeon: Clinton Sos, MD;  Location: [facility] ENDOSCOPY;  Service: Cardiovascular;  Laterality: N/A;   EYE SURGERY     lasik, 1998   HERNIA REPAIR     1977   INGUINAL HERNIA REPAIR Right 08/26/2012   Procedure: LAPAROSCOPIC INGUINAL HERNIA;  Surgeon: Clinton Hills, DO;  Location: [facility] OR;  Service: General;  Laterality: Right;  laparoscopic right inguinal hernia repair with mesh, umbilical hernia repair   INSERTION OF MESH Right 08/26/2012   Procedure: INSERTION OF MESH;  Surgeon: Clinton Hills, DO;  Location: [facility] OR;  Service: General;  Laterality: Right;  right inguinal hernia   IR FLUORO GUIDE CV LINE RIGHT  08/15/2022   IR US  GUIDE VASC ACCESS RIGHT  08/15/2022   TEE WITHOUT CARDIOVERSION N/A 03/04/2019   Procedure: TRANSESOPHAGEAL ECHOCARDIOGRAM (TEE);  Surgeon: Clinton Quince, MD;  Location: [facility] ENDOSCOPY;  Service: Cardiovascular;  Laterality: N/A;   UMBILICAL HERNIA REPAIR N/A 08/26/2012   Procedure: HERNIA REPAIR UMBILICAL ADULT;  Surgeon: Clinton Hills, DO;  Location: [facility] OR;  Service: General;  Laterality: N/A;    Social History   Socioeconomic History   Marital status: Married     Spouse name: [name]   Number of children: 3   Years of education: Not on file   Highest education level: Bachelor's degree (e.g., BA, AB, BS)  Occupational History   Occupation: retired  Tobacco Use   Smoking status: Never   Smokeless tobacco: Never  Vaping Use   Vaping status: Never Used  Substance and Sexual Activity   Alcohol use: No    Alcohol/week: 0.0 standard drinks of alcohol   Drug use: No   Sexual activity: Yes  Other Topics Concern   Not on file  Social History Narrative   Lives in [place]   Social Drivers of Health   Financial Resource Strain: Low Risk  (04/17/2021)   Overall Financial Resource Strain (CARDIA)    Difficulty of Paying Living Expenses: Not hard at all  Food Insecurity: No Food Insecurity (08/18/2022)   Hunger Vital Sign    Worried About Running Out of  Food in the Last Year: Never true    Ran Out of Food in the Last Year: Never true  Transportation Needs: No Transportation Needs (08/18/2022)   PRAPARE - Administrator, Civil Service (Medical): No    Lack of Transportation (Non-Medical): No  Physical Activity: Not on file  Stress: Not on file  Social Connections: Not on file  Intimate Partner Violence: Not At Risk (08/18/2022)   Humiliation, Afraid, Rape, and Kick questionnaire    Fear of Current or Ex-Partner: No    Emotionally Abused: No    Physically Abused: No    Sexually Abused: No    Family History  Problem Relation Age of Onset   Asthma Mother    Breast cancer Mother    Hypertension Mother    COPD Father    Prostate cancer Father    Hypertension Father    Lymphoma Sister     ROS: He is having significant issues with peripheral neuropathy.  No fevers or chills, productive cough, hemoptysis, dysphasia, odynophagia, melena, hematochezia, dysuria, hematuria, rash, seizure activity, orthopnea, PND, pedal edema, claudication. Remaining systems are negative.  Physical Exam: Well-developed obese in no acute  distress.  Skin is warm and dry.  HEENT is normal.  Neck is supple.  Chest is clear to auscultation with normal expansion.  Cardiovascular exam is irregular Abdominal exam nontender or distended. No masses palpated. Extremities show no edema. neuro grossly intact  A/P  1 permanent atrial fibrillation-plan to continue carvedilol  for rate control.  Continue apixaban .  2 chronic diastolic congestive heart failure-patient is reasonly well compensated today.  Will continue diuretic at present dose.  Check potassium and renal function.  3 hypertension-blood pressure is controlled.  Continue present medications.  4 history of cardiomyopathy-LV function has improved on most recent echocardiogram.  5 chronic stage III kidney disease  6 Mitral regurgitation-mild on most recent echocardiogram.  7 morbid obesity-we again discussed the importance of weight loss.  We will see if [name] can be covered if he is not diabetic.  If so we will refer him to the pharmacy to initiate.  Alexandria Angel, MD

## 2023-12-03 ENCOUNTER — Other Ambulatory Visit (HOSPITAL_BASED_OUTPATIENT_CLINIC_OR_DEPARTMENT_OTHER): Payer: Self-pay

## 2023-12-03 NOTE — Telephone Encounter (Signed)
 Additional information requested for PA approval/denial. Additional information uploaded to covermymeds and waiting on response.

## 2023-12-03 NOTE — Telephone Encounter (Signed)
 PA for Lidocaine  patch was denied because it is not being prescribed for the treatment of postherpetic neuralgia, chronic back pain, or diabetic neuropathic pain.

## 2023-12-11 ENCOUNTER — Telehealth: Payer: Self-pay | Admitting: Pharmacy Technician

## 2023-12-11 ENCOUNTER — Encounter: Payer: Self-pay | Admitting: Cardiology

## 2023-12-11 ENCOUNTER — Ambulatory Visit: Attending: Cardiology | Admitting: Cardiology

## 2023-12-11 ENCOUNTER — Other Ambulatory Visit (HOSPITAL_COMMUNITY): Payer: Self-pay

## 2023-12-11 VITALS — BP 112/68 | HR 97 | Ht 76.0 in | Wt 311.0 lb

## 2023-12-11 DIAGNOSIS — I5032 Chronic diastolic (congestive) heart failure: Secondary | ICD-10-CM

## 2023-12-11 DIAGNOSIS — I10 Essential (primary) hypertension: Secondary | ICD-10-CM | POA: Diagnosis present

## 2023-12-11 DIAGNOSIS — I4821 Permanent atrial fibrillation: Secondary | ICD-10-CM

## 2023-12-11 DIAGNOSIS — N184 Chronic kidney disease, stage 4 (severe): Secondary | ICD-10-CM

## 2023-12-11 MED ORDER — CARVEDILOL 25 MG PO TABS: 25.0000 mg | ORAL_TABLET | Freq: Two times a day (BID) | ORAL | 4 refills | Status: DC

## 2023-12-11 NOTE — Telephone Encounter (Signed)
 Pharmacy Patient Advocate Encounter   Received notification from Physician's Office that prior authorization for wegovy is required/requested.   Insurance verification completed.   The patient is insured through [employer] .   Per test claim: PA required; PA submitted to above mentioned insurance via CoverMyMeds Key/confirmation #/EOC [id number] Status is pending

## 2023-12-11 NOTE — Patient Instructions (Signed)

## 2023-12-12 ENCOUNTER — Ambulatory Visit: Payer: Self-pay | Admitting: Cardiology

## 2023-12-12 LAB — BASIC METABOLIC PANEL WITH GFR
BUN/Creatinine Ratio: 17 (ref 10–24)
BUN: 37 mg/dL — ABNORMAL HIGH (ref 8–27)
CO2: 21 mmol/L (ref 20–29)
Calcium: 9.6 mg/dL (ref 8.6–10.2)
Chloride: 104 mmol/L (ref 96–106)
Creatinine, Ser: 2.12 mg/dL — ABNORMAL HIGH (ref 0.76–1.27)
Glucose: 95 mg/dL (ref 70–99)
Potassium: 4.1 mmol/L (ref 3.5–5.2)
Sodium: 142 mmol/L (ref 134–144)
eGFR: 34 mL/min/{1.73_m2} — ABNORMAL LOW (ref >59)

## 2023-12-14 ENCOUNTER — Encounter: Payer: Self-pay | Admitting: Cardiology

## 2023-12-14 NOTE — Telephone Encounter (Signed)
 Pharmacy Patient Advocate Encounter  Received notification from [employer] that Prior Authorization for wegovy has been DENIED.  Full denial letter will be uploaded to the media tab. See denial reason below.   PA #/Case ID/Reference #: [id number]

## 2023-12-14 NOTE — Telephone Encounter (Signed)
 Please review and advise.

## 2023-12-15 ENCOUNTER — Other Ambulatory Visit (HOSPITAL_BASED_OUTPATIENT_CLINIC_OR_DEPARTMENT_OTHER): Payer: Self-pay | Admitting: *Deleted

## 2023-12-15 ENCOUNTER — Other Ambulatory Visit: Payer: Self-pay | Admitting: Cardiology

## 2023-12-15 DIAGNOSIS — N184 Chronic kidney disease, stage 4 (severe): Secondary | ICD-10-CM

## 2023-12-15 DIAGNOSIS — I4821 Permanent atrial fibrillation: Secondary | ICD-10-CM

## 2023-12-15 DIAGNOSIS — I5032 Chronic diastolic (congestive) heart failure: Secondary | ICD-10-CM

## 2023-12-15 DIAGNOSIS — I4819 Other persistent atrial fibrillation: Secondary | ICD-10-CM

## 2023-12-15 MED ORDER — TORSEMIDE 20 MG PO TABS: ORAL_TABLET | ORAL | Status: DC

## 2023-12-16 NOTE — Telephone Encounter (Signed)
 Spoke with pt, he will purchase enough until he can get the refill 12/20/23. He has not taken any today and his blood pressure this morning was 148/85. Aware will discuss with dr Audery Blazing what he can do if his blood pressure were to get extremely high.

## 2023-12-16 NOTE — Telephone Encounter (Signed)
 Prescription refill request for Eliquis  received. Indication: AF Last office visit: 12/11/23  Isobel Marie MD Scr: 2.12 on 12/11/23  Epic Age: 68 Weight: 141.1kg  Based on above findings Eliquis  5mg  twice daily is the appropriate dose.  Refill approved.

## 2023-12-22 MED ORDER — HYDRALAZINE HCL 10 MG PO TABS: 10.0000 mg | ORAL_TABLET | Freq: Three times a day (TID) | ORAL | 3 refills | Status: DC | PRN

## 2023-12-22 NOTE — Addendum Note (Signed)
 Addended by: Render Carrie on: 12/22/2023 07:36 AM   Modules accepted: Orders

## 2024-02-01 ENCOUNTER — Encounter: Payer: Self-pay | Admitting: *Deleted

## 2024-02-05 ENCOUNTER — Other Ambulatory Visit: Payer: Self-pay | Admitting: Internal Medicine

## 2024-02-05 DIAGNOSIS — Z87438 Personal history of other diseases of male genital organs: Secondary | ICD-10-CM

## 2024-02-08 ENCOUNTER — Encounter (HOSPITAL_COMMUNITY): Payer: Self-pay

## 2024-02-08 ENCOUNTER — Inpatient Hospital Stay (HOSPITAL_COMMUNITY)

## 2024-02-08 ENCOUNTER — Inpatient Hospital Stay (HOSPITAL_COMMUNITY)
Admission: EM | Admit: 2024-02-08 | Discharge: 2024-02-10 | DRG: 872 | Disposition: A | Discharge status: Home / Self Care | Attending: Internal Medicine | Admitting: Internal Medicine

## 2024-02-08 ENCOUNTER — Emergency Department (HOSPITAL_COMMUNITY)

## 2024-02-08 ENCOUNTER — Other Ambulatory Visit: Payer: Self-pay

## 2024-02-08 DIAGNOSIS — Z885 Allergy status to narcotic agent status: Secondary | ICD-10-CM

## 2024-02-08 DIAGNOSIS — I5032 Chronic diastolic (congestive) heart failure: Secondary | ICD-10-CM | POA: Diagnosis present

## 2024-02-08 DIAGNOSIS — I4821 Permanent atrial fibrillation: Secondary | ICD-10-CM | POA: Diagnosis present

## 2024-02-08 DIAGNOSIS — I130 Hypertensive heart and chronic kidney disease with heart failure and stage 1 through stage 4 chronic kidney disease, or unspecified chronic kidney disease: Secondary | ICD-10-CM | POA: Diagnosis present

## 2024-02-08 DIAGNOSIS — E8721 Acute metabolic acidosis: Secondary | ICD-10-CM | POA: Diagnosis present

## 2024-02-08 DIAGNOSIS — N179 Acute kidney failure, unspecified: Secondary | ICD-10-CM | POA: Diagnosis present

## 2024-02-08 DIAGNOSIS — R531 Weakness: Secondary | ICD-10-CM | POA: Diagnosis present

## 2024-02-08 DIAGNOSIS — Z6835 Body mass index (BMI) 35.0-35.9, adult: Secondary | ICD-10-CM

## 2024-02-08 DIAGNOSIS — N136 Pyonephrosis: Secondary | ICD-10-CM | POA: Diagnosis present

## 2024-02-08 DIAGNOSIS — G8929 Other chronic pain: Secondary | ICD-10-CM | POA: Diagnosis present

## 2024-02-08 DIAGNOSIS — I428 Other cardiomyopathies: Secondary | ICD-10-CM | POA: Diagnosis present

## 2024-02-08 DIAGNOSIS — A419 Sepsis, unspecified organism: Secondary | ICD-10-CM | POA: Diagnosis present

## 2024-02-08 DIAGNOSIS — N184 Chronic kidney disease, stage 4 (severe): Secondary | ICD-10-CM | POA: Diagnosis present

## 2024-02-08 DIAGNOSIS — N320 Bladder-neck obstruction: Secondary | ICD-10-CM | POA: Diagnosis present

## 2024-02-08 DIAGNOSIS — Z881 Allergy status to other antibiotic agents status: Secondary | ICD-10-CM

## 2024-02-08 DIAGNOSIS — T426X5A Adverse effect of other antiepileptic and sedative-hypnotic drugs, initial encounter: Secondary | ICD-10-CM | POA: Diagnosis present

## 2024-02-08 DIAGNOSIS — F419 Anxiety disorder, unspecified: Secondary | ICD-10-CM | POA: Diagnosis present

## 2024-02-08 DIAGNOSIS — R339 Retention of urine, unspecified: Secondary | ICD-10-CM | POA: Diagnosis present

## 2024-02-08 DIAGNOSIS — I5031 Acute diastolic (congestive) heart failure: Secondary | ICD-10-CM | POA: Diagnosis not present

## 2024-02-08 DIAGNOSIS — G4730 Sleep apnea, unspecified: Secondary | ICD-10-CM | POA: Diagnosis present

## 2024-02-08 DIAGNOSIS — J8489 Other specified interstitial pulmonary diseases: Secondary | ICD-10-CM | POA: Diagnosis present

## 2024-02-08 DIAGNOSIS — Z8042 Family history of malignant neoplasm of prostate: Secondary | ICD-10-CM

## 2024-02-08 DIAGNOSIS — Z79899 Other long term (current) drug therapy: Secondary | ICD-10-CM | POA: Diagnosis not present

## 2024-02-08 DIAGNOSIS — Z8249 Family history of ischemic heart disease and other diseases of the circulatory system: Secondary | ICD-10-CM | POA: Diagnosis not present

## 2024-02-08 DIAGNOSIS — E785 Hyperlipidemia, unspecified: Secondary | ICD-10-CM | POA: Diagnosis present

## 2024-02-08 DIAGNOSIS — E66812 Obesity, class 2: Secondary | ICD-10-CM | POA: Diagnosis present

## 2024-02-08 DIAGNOSIS — N401 Enlarged prostate with lower urinary tract symptoms: Secondary | ICD-10-CM | POA: Diagnosis present

## 2024-02-08 DIAGNOSIS — Z888 Allergy status to other drugs, medicaments and biological substances status: Secondary | ICD-10-CM | POA: Diagnosis not present

## 2024-02-08 DIAGNOSIS — N390 Urinary tract infection, site not specified: Secondary | ICD-10-CM

## 2024-02-08 DIAGNOSIS — E1122 Type 2 diabetes mellitus with diabetic chronic kidney disease: Secondary | ICD-10-CM | POA: Diagnosis present

## 2024-02-08 DIAGNOSIS — N200 Calculus of kidney: Secondary | ICD-10-CM | POA: Diagnosis present

## 2024-02-08 DIAGNOSIS — K589 Irritable bowel syndrome without diarrhea: Secondary | ICD-10-CM | POA: Diagnosis present

## 2024-02-08 DIAGNOSIS — F32A Depression, unspecified: Secondary | ICD-10-CM | POA: Diagnosis present

## 2024-02-08 DIAGNOSIS — Z7901 Long term (current) use of anticoagulants: Secondary | ICD-10-CM

## 2024-02-08 DIAGNOSIS — K219 Gastro-esophageal reflux disease without esophagitis: Secondary | ICD-10-CM | POA: Diagnosis present

## 2024-02-08 DIAGNOSIS — Z825 Family history of asthma and other chronic lower respiratory diseases: Secondary | ICD-10-CM

## 2024-02-08 DIAGNOSIS — Z8701 Personal history of pneumonia (recurrent): Secondary | ICD-10-CM

## 2024-02-08 DIAGNOSIS — N138 Other obstructive and reflux uropathy: Secondary | ICD-10-CM | POA: Diagnosis present

## 2024-02-08 LAB — URINALYSIS, ROUTINE W REFLEX MICROSCOPIC
Bilirubin Urine: NEGATIVE
Glucose, UA: 50 mg/dL — AB
Ketones, ur: NEGATIVE mg/dL
Nitrite: NEGATIVE
Protein, ur: 100 mg/dL — AB
RBC / HPF: >50 RBC/hpf (ref 0–5)
Specific Gravity, Urine: 1.01 (ref 1.005–1.030)
WBC, UA: >50 WBC/hpf (ref 0–5)
pH: 6 (ref 5.0–8.0)

## 2024-02-08 LAB — BASIC METABOLIC PANEL WITH GFR
Anion gap: 17 — ABNORMAL HIGH (ref 5–15)
BUN: 102 mg/dL — ABNORMAL HIGH (ref 8–23)
CO2: 14 mmol/L — ABNORMAL LOW (ref 22–32)
Calcium: 8.5 mg/dL — ABNORMAL LOW (ref 8.9–10.3)
Chloride: 98 mmol/L (ref 98–111)
Creatinine, Ser: 5.95 mg/dL — ABNORMAL HIGH (ref 0.61–1.24)
GFR, Estimated: 10 mL/min — ABNORMAL LOW (ref >60)
Glucose, Bld: 126 mg/dL — ABNORMAL HIGH (ref 70–99)
Potassium: 4.2 mmol/L (ref 3.5–5.1)
Sodium: 129 mmol/L — ABNORMAL LOW (ref 135–145)

## 2024-02-08 LAB — CBC
HCT: 43.1 % (ref 39.0–52.0)
HCT: 41.7 % (ref 39.0–52.0)
Hemoglobin: 14.2 g/dL (ref 13.0–17.0)
Hemoglobin: 13.6 g/dL (ref 13.0–17.0)
MCH: 28.7 pg (ref 26.0–34.0)
MCH: 29.2 pg (ref 26.0–34.0)
MCHC: 32.9 g/dL (ref 30.0–36.0)
MCHC: 32.6 g/dL (ref 30.0–36.0)
MCV: 87.2 fL (ref 80.0–100.0)
MCV: 89.7 fL (ref 80.0–100.0)
Platelets: 274 K/uL (ref 150–400)
Platelets: 332 K/uL (ref 150–400)
RBC: 4.94 MIL/uL (ref 4.22–5.81)
RBC: 4.65 MIL/uL (ref 4.22–5.81)
RDW: 14.4 % (ref 11.5–15.5)
RDW: 14.4 % (ref 11.5–15.5)
WBC: 13.8 K/uL — ABNORMAL HIGH (ref 4.0–10.5)
WBC: 12.8 K/uL — ABNORMAL HIGH (ref 4.0–10.5)
nRBC: 0 % (ref 0.0–0.2)
nRBC: 0 % (ref 0.0–0.2)

## 2024-02-08 LAB — I-STAT CHEM 8, ED
BUN: 94 mg/dL — ABNORMAL HIGH (ref 8–23)
Calcium, Ion: 1.08 mmol/L — ABNORMAL LOW (ref 1.15–1.40)
Chloride: 101 mmol/L (ref 98–111)
Creatinine, Ser: 6.6 mg/dL — ABNORMAL HIGH (ref 0.61–1.24)
Glucose, Bld: 127 mg/dL — ABNORMAL HIGH (ref 70–99)
HCT: 46 % (ref 39.0–52.0)
Hemoglobin: 15.6 g/dL (ref 13.0–17.0)
Potassium: 4.2 mmol/L (ref 3.5–5.1)
Sodium: 127 mmol/L — ABNORMAL LOW (ref 135–145)
TCO2: 16 mmol/L — ABNORMAL LOW (ref 22–32)

## 2024-02-08 LAB — BRAIN NATRIURETIC PEPTIDE: B Natriuretic Peptide: 86.4 pg/mL (ref 0.0–100.0)

## 2024-02-08 LAB — CREATININE, SERUM
Creatinine, Ser: 5.68 mg/dL — ABNORMAL HIGH (ref 0.61–1.24)
GFR, Estimated: 10 mL/min — ABNORMAL LOW (ref >60)

## 2024-02-08 LAB — I-STAT CG4 LACTIC ACID, ED: Lactic Acid, Venous: 0.7 mmol/L (ref 0.5–1.9)

## 2024-02-08 LAB — MAGNESIUM: Magnesium: 2.2 mg/dL (ref 1.7–2.4)

## 2024-02-08 LAB — HIV ANTIBODY (ROUTINE TESTING W REFLEX): HIV Screen 4th Generation wRfx: NONREACTIVE

## 2024-02-08 LAB — PROCALCITONIN: Procalcitonin: 1.42 ng/mL

## 2024-02-08 MED ORDER — HEPARIN SODIUM (PORCINE) 5000 UNIT/ML IJ SOLN: 5000.0000 [IU] | Freq: Three times a day (TID) | INTRAMUSCULAR | Status: DC

## 2024-02-08 MED ORDER — ACETAMINOPHEN 325 MG PO TABS
650.0000 mg | ORAL_TABLET | Freq: Once | ORAL | Status: AC
  Administered 2024-02-08: 650 mg via ORAL
  Filled 2024-02-08: qty 2

## 2024-02-08 MED ORDER — DULOXETINE HCL 30 MG PO CPEP
60.0000 mg | ORAL_CAPSULE | Freq: Every day | ORAL | Status: DC
  Administered 2024-02-08: 60 mg via ORAL
  Filled 2024-02-08: qty 2

## 2024-02-08 MED ORDER — SODIUM CHLORIDE 0.9 % IV SOLN
2.0000 g | Freq: Once | INTRAVENOUS | Status: AC
  Administered 2024-02-08: 2 g via INTRAVENOUS
  Filled 2024-02-08: qty 20

## 2024-02-08 MED ORDER — ROPINIROLE HCL 1 MG PO TABS
1.0000 mg | ORAL_TABLET | Freq: Every day | ORAL | Status: DC
Start: 2024-02-08 — End: 2024-02-09
  Administered 2024-02-08: 1 mg via ORAL
  Filled 2024-02-08: qty 1

## 2024-02-08 MED ORDER — APIXABAN 5 MG PO TABS
5.0000 mg | ORAL_TABLET | Freq: Two times a day (BID) | ORAL | Status: DC
Start: 2024-02-08 — End: 2024-02-10
  Administered 2024-02-09 – 2024-02-10 (×2): 5 mg via ORAL
  Filled 2024-02-08 (×3): qty 1

## 2024-02-08 MED ORDER — FINASTERIDE 5 MG PO TABS
5.0000 mg | ORAL_TABLET | Freq: Every day | ORAL | Status: DC
  Administered 2024-02-08 – 2024-02-10 (×3): 5 mg via ORAL
  Filled 2024-02-08 (×3): qty 1

## 2024-02-08 MED ORDER — CARVEDILOL 12.5 MG PO TABS
25.0000 mg | ORAL_TABLET | Freq: Two times a day (BID) | ORAL | Status: DC
  Administered 2024-02-08: 25 mg via ORAL
  Filled 2024-02-08: qty 2

## 2024-02-08 MED ORDER — SODIUM CHLORIDE 0.9 % IV SOLN
1.0000 g | INTRAVENOUS | Status: DC
  Administered 2024-02-09 – 2024-02-10 (×2): 1 g via INTRAVENOUS
  Filled 2024-02-08 (×2): qty 10

## 2024-02-08 MED ORDER — CHLORHEXIDINE GLUCONATE CLOTH 2 % EX PADS
6.0000 | MEDICATED_PAD | Freq: Every day | CUTANEOUS | Status: DC
  Administered 2024-02-09 – 2024-02-10 (×2): 6 via TOPICAL

## 2024-02-08 MED ORDER — PREGABALIN 25 MG PO CAPS: 75.0000 mg | ORAL_CAPSULE | Freq: Two times a day (BID) | ORAL | Status: DC

## 2024-02-08 MED ORDER — NEPRO/CARBSTEADY PO LIQD
237.0000 mL | Freq: Two times a day (BID) | ORAL | Status: DC
  Administered 2024-02-10: 237 mL via ORAL

## 2024-02-08 MED ORDER — METHOCARBAMOL 500 MG PO TABS
500.0000 mg | ORAL_TABLET | Freq: Three times a day (TID) | ORAL | Status: DC | PRN
  Administered 2024-02-08: 500 mg via ORAL
  Filled 2024-02-08: qty 1

## 2024-02-08 MED ORDER — GABAPENTIN 300 MG PO CAPS
300.0000 mg | ORAL_CAPSULE | Freq: Every day | ORAL | Status: DC
  Administered 2024-02-08: 300 mg via ORAL
  Filled 2024-02-08: qty 1

## 2024-02-08 MED ORDER — LIDOCAINE HCL URETHRAL/MUCOSAL 2 % EX GEL
1.0000 | Freq: Once | CUTANEOUS | Status: DC
  Filled 2024-02-08: qty 6

## 2024-02-08 MED ORDER — SODIUM CHLORIDE 0.9 % IV SOLN: INTRAVENOUS | Status: AC

## 2024-02-08 MED ORDER — SODIUM BICARBONATE 650 MG PO TABS
650.0000 mg | ORAL_TABLET | Freq: Three times a day (TID) | ORAL | Status: DC
  Administered 2024-02-08 – 2024-02-10 (×6): 650 mg via ORAL
  Filled 2024-02-08 (×6): qty 1

## 2024-02-08 MED ORDER — LACTATED RINGERS IV BOLUS
1000.0000 mL | Freq: Once | INTRAVENOUS | Status: AC
  Administered 2024-02-08: 1000 mL via INTRAVENOUS

## 2024-02-08 MED ORDER — HYDROMORPHONE HCL 1 MG/ML IJ SOLN
1.0000 mg | Freq: Once | INTRAMUSCULAR | Status: AC
  Administered 2024-02-08: 1 mg via INTRAVENOUS
  Filled 2024-02-08 (×2): qty 1

## 2024-02-08 NOTE — Plan of Care (Signed)
   Problem: Clinical Measurements: Goal: Ability to maintain clinical measurements within normal limits will improve Outcome: Progressing Goal: Will remain free from infection Outcome: Progressing

## 2024-02-08 NOTE — ED Notes (Signed)
 Delayed transport upstairs due to needing meds given and catheter changed per RN

## 2024-02-08 NOTE — ED Triage Notes (Addendum)
 Pt states he was sent by MD for further evaluation of kidney issues, states creatinine high; denies pain, denies sob, denies n/v; endorses bilateral leg weakness

## 2024-02-08 NOTE — ED Provider Notes (Signed)
 [place] EMERGENCY DEPARTMENT AT [facility] Provider Note   CSN: [id number] Arrival date & time: 02/08/24  1027     Patient presents with: No chief complaint on file.   [name] is a 68 y.o. male.   HPI Patient sent in for lab and Thana abnormality.  Reportedly has been feeling somewhat weak.  Came from PCP.  Reportedly has elevated creatinine although cannot tell a specific number.  Reportedly is around 4 however.  Also had another laboratory abnormality that was reportedly 11 although he cannot tell me what this was.  Does have a history of chronic kidney disease.  Has been on fluid pills due to swelling in his legs.    Prior to Admission medications   Medication Sig Start Date End Date Taking? Authorizing Provider  DULoxetine  (CYMBALTA ) 60 MG capsule Take 60 mg by mouth daily. 01/10/24  Yes [provider]  ketoconazole (NIZORAL) 2 % cream Apply topically 2 (two) times daily. 01/15/24  Yes [provider]  pregabalin  (LYRICA ) 75 MG capsule Take 75 mg by mouth 2 (two) times daily. 01/09/24  Yes [provider]  alfuzosin  (UROXATRAL ) 10 MG 24 hr tablet Take 10 mg by mouth daily with breakfast. 01/14/23   [provider]  amLODipine  (NORVASC ) 10 MG tablet Take 1 tablet (10 mg total) by mouth daily. 11/13/23   Pietro Redell RAMAN, MD  carvedilol  (COREG ) 25 MG tablet Take 1 tablet (25 mg total) by mouth 2 (two) times daily. 12/11/23 12/05/24  Pietro Redell RAMAN, MD  clonazePAM (KLONOPIN) 0.5 MG tablet Take 0.25-0.5 mg by mouth at bedtime as needed for anxiety. 10/10/22   [provider]  ELIQUIS  5 MG TABS tablet TAKE 1 TABLET BY MOUTH TWICE DAILY 12/16/23   Pietro Redell RAMAN, MD  finasteride  (PROSCAR ) 5 MG tablet Take 1 tablet (5 mg total) by mouth daily. 04/28/22   Akula, Vijaya, MD  gabapentin  (NEURONTIN ) 100 MG capsule Take 1 capsule (100 mg total) by mouth at bedtime. 05/22/23 12/11/23  Lamount Ethan CROME, DPM  gabapentin  (NEURONTIN ) 100 MG  capsule Take 3 capsules (300 mg total) by mouth at bedtime. 08/24/23   Onita Duos, MD  hydrALAZINE  (APRESOLINE ) 10 MG tablet Take 1 tablet (10 mg total) by mouth 3 (three) times daily as needed. 12/22/23   Pietro Redell RAMAN, MD  lidocaine  (LIDODERM ) 5 % Place 2 patches onto the skin daily. Remove & Discard patch within 12 hours or as directed by MD 11/19/23   Lamount Ethan CROME, DPM  methocarbamol  (ROBAXIN ) 750 MG tablet Take 750 mg by mouth 2 (two) times daily as needed. 11/23/23   [provider]  rOPINIRole  (REQUIP ) 1 MG tablet Take 1 mg by mouth in the morning and at bedtime. 12/07/23   [provider]  torsemide  (DEMADEX ) 20 MG tablet Take  40 mg alternating with 20 mg daily; take additional 20 mg daily as needed for worsening lower ext edema or weight gain of 3 lbs 12/15/23   Pietro Redell RAMAN, MD  traMADol  (ULTRAM ) 50 MG tablet Take 50 mg by mouth daily as needed. 09/08/23   [provider]  TYLENOL  500 MG tablet Take 1,500 mg by mouth as needed for mild pain or headache.    [provider]    Allergies: Baclofen, Doxycycline  hyclate, Gabapentin , Hycodan [hydrocodone  bit-homatrop mbr], Hydrocodone -acetaminophen , and Pregabalin     Review of Systems  Updated Vital Signs BP 112/77   Pulse (!) 105   Temp 97.6 F (36.4  C)   Resp (!) 21   SpO2 94%   Physical Exam Vitals and nursing note reviewed.  Cardiovascular:     Rate and Rhythm: Regular rhythm.  Chest:     Chest wall: No tenderness.  Abdominal:     Tenderness: There is no abdominal tenderness.  Musculoskeletal:     Cervical back: Neck supple.     Right lower leg: Edema present.     Left lower leg: Edema present.  Skin:    General: Skin is warm.     Capillary Refill: Capillary refill takes less than 2 seconds.  Neurological:     Mental Status: He is alert and oriented to person, place, and time.     (all labs ordered are listed, but only abnormal results are displayed) Labs Reviewed  URINALYSIS,  ROUTINE W REFLEX MICROSCOPIC - Abnormal; Notable for the following components:      Result Value   APPearance TURBID (*)    Glucose, UA 50 (*)    Hgb urine dipstick MODERATE (*)    Protein, ur 100 (*)    Leukocytes,Ua MODERATE (*)    Bacteria, UA MANY (*)    Non Squamous Epithelial 6-10 (*)    All other components within normal limits  CBC - Abnormal; Notable for the following components:   WBC 13.8 (*)    All other components within normal limits  BASIC METABOLIC PANEL WITH GFR - Abnormal; Notable for the following components:   Sodium 129 (*)    CO2 14 (*)    Glucose, Bld 126 (*)    BUN 102 (*)    Creatinine, Ser 5.95 (*)    Calcium  8.5 (*)    GFR, Estimated 10 (*)    Anion gap 17 (*)    All other components within normal limits  I-STAT CHEM 8, ED - Abnormal; Notable for the following components:   Sodium 127 (*)    BUN 94 (*)    Creatinine, Ser 6.60 (*)    Glucose, Bld 127 (*)    Calcium , Ion 1.08 (*)    TCO2 16 (*)    All other components within normal limits  URINE CULTURE  CULTURE, BLOOD (ROUTINE X 2)  CULTURE, BLOOD (ROUTINE X 2)  MAGNESIUM   I-STAT CG4 LACTIC ACID, ED    EKG: None  Radiology: DG Chest Portable 1 View Result Date: 02/08/2024 CLINICAL DATA:  Bilateral leg weakness and kidney issues. EXAM: PORTABLE CHEST 1 VIEW COMPARISON:  08/13/2022 FINDINGS: Lungs are adequately inflated without focal airspace consolidation or effusion. Cardiomediastinal silhouette and remainder of the exam is unchanged. IMPRESSION: No active disease. Electronically Signed   By: Toribio Agreste M.D.   On: 02/08/2024 12:10     Procedures   Medications Ordered in the ED  cefTRIAXone  (ROCEPHIN ) 2 g in sodium chloride  0.9 % 100 mL IVPB (has no administration in time range)  lactated ringers  bolus 1,000 mL (0 mLs Intravenous Stopped 02/08/24 1314)                                    Medical Decision Making Amount and/or Complexity of Data Reviewed Labs: ordered. Radiology:  ordered.  Risk Decision regarding hospitalization.   Patient with reported elevated creatinine.  Unsure of actual laboratory abnormalities however.  States he feels as if he been urinating the same as he had before.  Somewhat nonspecific blood pressure here.  Will recheck to make  sure it is accurate.  Potentially due to overdiuresis.  Has been on fluid pills and reportedly had previously had a lot of swelling on his legs.  Will also check postvoid residual.  Differential diagnose includes effect of medication, overdiuresis, hypertension, bladder outlet obstruction.  There had been delay on the initial postvoid residual.  Later bladder scan done and reportedly showed roughly 320.  However patient was able to urinate around 100 cc.  Rechecked and still was elevated.  Had measurements on the bladder scanner both 250 and 400.  I think overall with the obstruction patient would benefit from catheter.  Urine does show likely infection.  Culture sent.  Will give antibiotics.  May have component of dehydration versus outlet obstruction versus UTI as a cause.  Doubt a severe sepsis at this time.  Have now added blood cultures and lactic acid.  Creatinine is elevated but normal potassium.  Will admit to hospitalist.  Of note patient also has history of atrial fibrillation and has at times passed into a rapid rate here.  CRITICAL CARE Performed by: [place] Total critical care time: 30 minutes Critical care time was exclusive of separately billable procedures and treating other patients. Critical care was necessary to treat or prevent imminent or life-threatening deterioration. Critical care was time spent personally by me on the following activities: development of treatment plan with patient and/or surrogate as well as nursing, discussions with consultants, evaluation of patient's response to treatment, examination of patient, obtaining history from patient or surrogate, ordering and  performing treatments and interventions, ordering and review of laboratory studies, ordering and review of radiographic studies, pulse oximetry and re-evaluation of patient's condition.      Final diagnoses:  AKI (acute kidney injury) [facility])  Bladder outlet obstruction  Urinary tract infection without hematuria, site unspecified    ED Discharge Orders     None          River Rankin, MD 02/08/24 1321

## 2024-02-08 NOTE — H&P (Addendum)
 History and Physical    Patient: [name] FMW:[id number] DOB: 13-Feb-1956 DOA: 02/08/2024 DOS: the patient was seen and examined on 02/08/2024 PCP: Arloa Elsie SAUNDERS, MD  Patient coming from: Home  Chief Complaint: No chief complaint on file.  HPI: [name] is a 68 y.o. male with medical history significant of stage 4 CKD, afib on Eliquis , chronic diastolic CHF,  HTN, HLD, and BPH p/w sepsis 2/2 presumed urinary source c/b AKI.  Pt was in his USOH until this past week. Per his report, pt called his nephrolgist earlier this week when he felt bad, who ordered labs. After his labs returned with worsening BUN/Cr, pt was advised to report to the ED for evaluation. Of note, he endorses increased lethargy and has slept 18/24 hours for the past few days. He initially attributed this fatigue to a virus, but when things didn't improve despite the extra rest, and after receiving the call from his nephrologist, he presented to the ED.  In the ED, pt hypotensive, tachycardic, and tachypneic on RA. Labs notable for Na 127, BUN/Cr 94/6.6, and WBC 13.8. UA positive LE and many bacteria. CXR w/o active disease. EDP started IV CTX, placed foley, and requested medicine admission.  Review of Systems: As mentioned in the history of present illness. All other systems reviewed and are negative. Past Medical History:  Diagnosis Date   Allergic rhinitis    Anxiety    Asthma    as a child   Cellulitis    Chronic kidney disease    Chronic low back pain    Complication of anesthesia    hard to put asleep, hard to wake up, nausea and vomiting   Depression    Dysrhythmia    ED (erectile dysfunction)    GERD (gastroesophageal reflux disease)    HLD (hyperlipidemia)    HTN (hypertension)    sees Dr. Lamar Quarry, [facility]   Hypertrophy of prostate with urinary obstruction and other lower urinary tract symptoms (LUTS)    IBS (irritable bowel syndrome)    Inguinal hernia without mention  of obstruction or gangrene, unilateral or unspecified, (not specified as recurrent)    Insomnia    Lumbar herniated disc    L7   Morbid obesity (HCC)    Narcotic addiction (HCC)    NICM (nonischemic cardiomyopathy) (HCC)    tachycardia mediated,  resolved with sinus   Persistent atrial fibrillation (HCC)    ablation done 03/2006 at [facility]   Pneumonia    hx of 2004   PONV (postoperative nausea and vomiting)    Sleep apnea    Twitching    legs   Past Surgical History:  Procedure Laterality Date   ATRIAL ABLATION SURGERY  2007   [facility]   CARDIOVERSION  08/11/2011   Procedure: CARDIOVERSION;  Surgeon: Redell GORMAN Shallow, MD;  Location: [facility] OR;  Service: Cardiovascular;  Laterality: N/A;   CARDIOVERSION N/A 04/16/2017   Procedure: CARDIOVERSION;  Surgeon: Francyne Headland, MD;  Location: [facility] ENDOSCOPY;  Service: Cardiovascular;  Laterality: N/A;   CARDIOVERSION N/A 03/04/2019   Procedure: CARDIOVERSION;  Surgeon: Shallow Redell GORMAN, MD;  Location: [facility] ENDOSCOPY;  Service: Cardiovascular;  Laterality: N/A;   CARDIOVERSION N/A 07/04/2019   Procedure: CARDIOVERSION;  Surgeon: Raford Riggs, MD;  Location: [facility] ENDOSCOPY;  Service: Cardiovascular;  Laterality: N/A;   EYE SURGERY     lasik, 1998   HERNIA REPAIR     1977   INGUINAL HERNIA REPAIR Right 08/26/2012   Procedure:  LAPAROSCOPIC INGUINAL HERNIA;  Surgeon: Redell Faith, DO;  Location: [facility] OR;  Service: General;  Laterality: Right;  laparoscopic right inguinal hernia repair with mesh, umbilical hernia repair   INSERTION OF MESH Right 08/26/2012   Procedure: INSERTION OF MESH;  Surgeon: Redell Faith, DO;  Location: [facility] OR;  Service: General;  Laterality: Right;  right inguinal hernia   IR FLUORO GUIDE CV LINE RIGHT  08/15/2022   IR US  GUIDE VASC ACCESS RIGHT  08/15/2022   TEE WITHOUT CARDIOVERSION N/A 03/04/2019   Procedure: TRANSESOPHAGEAL ECHOCARDIOGRAM (TEE);  Surgeon: Pietro Redell RAMAN, MD;  Location: [facility] ENDOSCOPY;  Service: Cardiovascular;   Laterality: N/A;   UMBILICAL HERNIA REPAIR N/A 08/26/2012   Procedure: HERNIA REPAIR UMBILICAL ADULT;  Surgeon: Redell Faith, DO;  Location: [facility] OR;  Service: General;  Laterality: N/A;   Social History:  reports that he has never smoked. He has never used smokeless tobacco. He reports that he does not drink alcohol and does not use drugs.  Allergies  Allergen Reactions   Baclofen Other (See Comments)    Severe dizziness   Doxycycline  Hyclate Other (See Comments)    Flu-like symptoms   Gabapentin  Swelling and Other (See Comments)    Swelling in feet   Hycodan [Hydrocodone  Bit-Homatrop Mbr] Other (See Comments)    Dizziness    Hydrocodone -Acetaminophen  Other (See Comments)    GI upset, Dizziness   Pregabalin  Swelling and Other (See Comments)    Severly dry mouth    Family History  Problem Relation Age of Onset   Asthma Mother    Breast cancer Mother    Hypertension Mother    COPD Father    Prostate cancer Father    Hypertension Father    Lymphoma Sister     Prior to Admission medications   Medication Sig Start Date End Date Taking? Authorizing Provider  DULoxetine  (CYMBALTA ) 60 MG capsule Take 60 mg by mouth daily. 01/10/24  Yes [provider]  ketoconazole (NIZORAL) 2 % cream Apply topically 2 (two) times daily. 01/15/24  Yes [provider]  pregabalin  (LYRICA ) 75 MG capsule Take 75 mg by mouth 2 (two) times daily. 01/09/24  Yes [provider]  alfuzosin  (UROXATRAL ) 10 MG 24 hr tablet Take 10 mg by mouth daily with breakfast. 01/14/23   [provider]  amLODipine  (NORVASC ) 10 MG tablet Take 1 tablet (10 mg total) by mouth daily. 11/13/23   Pietro Redell RAMAN, MD  carvedilol  (COREG ) 25 MG tablet Take 1 tablet (25 mg total) by mouth 2 (two) times daily. 12/11/23 12/05/24  Pietro Redell RAMAN, MD  clonazePAM (KLONOPIN) 0.5 MG tablet Take 0.25-0.5 mg by mouth at bedtime as needed for anxiety. 10/10/22   [provider]  ELIQUIS  5 MG TABS tablet  TAKE 1 TABLET BY MOUTH TWICE DAILY 12/16/23   Pietro Redell RAMAN, MD  finasteride  (PROSCAR ) 5 MG tablet Take 1 tablet (5 mg total) by mouth daily. 04/28/22   Akula, Vijaya, MD  gabapentin  (NEURONTIN ) 100 MG capsule Take 1 capsule (100 mg total) by mouth at bedtime. 05/22/23 12/11/23  Lamount Ethan CROME, DPM  gabapentin  (NEURONTIN ) 100 MG capsule Take 3 capsules (300 mg total) by mouth at bedtime. 08/24/23   Onita Duos, MD  hydrALAZINE  (APRESOLINE ) 10 MG tablet Take 1 tablet (10 mg total) by mouth 3 (three) times daily as needed. 12/22/23   Pietro Redell RAMAN, MD  lidocaine  (LIDODERM ) 5 % Place 2 patches onto the skin daily. Remove & Discard patch within 12 hours  or as directed by MD 11/19/23   Lamount Ethan CROME, DPM  methocarbamol  (ROBAXIN ) 750 MG tablet Take 750 mg by mouth 2 (two) times daily as needed. 11/23/23   [provider]  rOPINIRole  (REQUIP ) 1 MG tablet Take 1 mg by mouth in the morning and at bedtime. 12/07/23   [provider]  torsemide  (DEMADEX ) 20 MG tablet Take  40 mg alternating with 20 mg daily; take additional 20 mg daily as needed for worsening lower ext edema or weight gain of 3 lbs 12/15/23   Pietro Redell RAMAN, MD  traMADol  (ULTRAM ) 50 MG tablet Take 50 mg by mouth daily as needed. 09/08/23   [provider]  TYLENOL  500 MG tablet Take 1,500 mg by mouth as needed for mild pain or headache.    [provider]    Physical Exam: Vitals:   02/08/24 1030 02/08/24 1055 02/08/24 1215 02/08/24 1300  BP: (!) 155/130 (!) 88/45 99/77 112/77  Pulse: (!) 51  (!) 107 (!) 105  Resp: 18  (!) 26 (!) 21  Temp: 97.6 F (36.4 C)     SpO2: 91%  99% 94%   General: Alert, oriented x3, resting comfortably in no acute distress Respiratory: Lungs clear to auscultation bilaterally with normal respiratory effort; no w/r/r Cardiovascular: Regular rate and rhythm w/o m/r/g Abdomen: Soft, nontender, nondistended. Positive bowel sounds MSK: No obvious joint deformities or  swelling   Data Reviewed:  Lab Results  Component Value Date   WBC 13.8 (H) 02/08/2024   HGB 15.6 02/08/2024   HCT 46.0 02/08/2024   MCV 87.2 02/08/2024   PLT 274 02/08/2024   Lab Results  Component Value Date   GLUCOSE 127 (H) 02/08/2024   CALCIUM  8.5 (L) 02/08/2024   NA 127 (L) 02/08/2024   K 4.2 02/08/2024   CO2 14 (L) 02/08/2024   CL 101 02/08/2024   BUN 94 (H) 02/08/2024   CREATININE 6.60 (H) 02/08/2024   Lab Results  Component Value Date   ALT 55 (H) 08/14/2022   AST 38 08/14/2022   ALKPHOS 90 08/14/2022   BILITOT 0.7 08/14/2022   Lab Results  Component Value Date   INR 3.8 (A) 07/21/2019   INR 4.0 (A) 07/13/2019   INR 2.9 (H) 07/04/2019    Radiology: DG Chest Portable 1 View Result Date: 02/08/2024 CLINICAL DATA:  Bilateral leg weakness and kidney issues. EXAM: PORTABLE CHEST 1 VIEW COMPARISON:  08/13/2022 FINDINGS: Lungs are adequately inflated without focal airspace consolidation or effusion. Cardiomediastinal silhouette and remainder of the exam is unchanged. IMPRESSION: No active disease. Electronically Signed   By: Toribio Agreste M.D.   On: 02/08/2024 12:10    Assessment and Plan: 43M h/o stage 4 CKD, afib on Eliquis , chronic diastolic CHF,  HTN, HLD, and BPH p/w sepsis 2/2 presumed urinary source c/b AKI.  AKI -Renal consulted; recs: agree with IVF and abx; obtain renal US  and they will eval tomorrow morning -S/p foley catheter placed in ED; will need voiding trial prior to d/c -HOLD pta diuretics -MIVF: NS at 50cc/h for 24h -PO bicarbonate 650mg  TID for now -Strict I&Os and daily weights (standing preferred) -F/u BMP daily -Renally dose medications for CrCl -Avoid lovenox , NSAIDs, morphine , Fleet's phosphate enema, regular insulin, contrast; no gadolinium for MRI to avoid nephrogenic systemic fibrosis  Sepsis 2/2 urinary source -IV CTX for now -F/u urine and blood cultures  Afib -PTA Coreg  25mg  BID and Eliquis  5mg  BID  HFpEF -HOLD pta  toresmide for now -F/u  BNP; consider renal consult if no improvement with gentle IV hydration above    Advance Care Planning:   Code Status: Full Code   Consults: N/A  Family Communication: N/A  Severity of Illness: The appropriate patient status for this patient is INPATIENT. Inpatient status is judged to be reasonable and necessary in order to provide the required intensity of service to ensure the patient's safety. The patient's presenting symptoms, physical exam findings, and initial radiographic and laboratory data in the context of their chronic comorbidities is felt to place them at high risk for further clinical deterioration. Furthermore, it is not anticipated that the patient will be medically stable for discharge from the hospital within 2 midnights of admission.   * I certify that at the point of admission it is my clinical judgment that the patient will require inpatient hospital care spanning beyond 2 midnights from the point of admission due to high intensity of service, high risk for further deterioration and high frequency of surveillance required.*   ------- I spent 55 minutes reviewing previous labs/notes, obtaining separate history at the bedside, counseling/discussing the treatment plan outlined above, ordering medications/tests, and performing clinical documentation.  Author: Marsha Ada, MD 02/08/2024 1:33 PM  For on call review [email or web address].

## 2024-02-08 NOTE — Progress Notes (Signed)
 This RN and Georgette Baller, LPN attempted to insert Coude catherter, 16 fr, per MD order. This attempt was unsuccessful and staff nurse was recommended to put in for a urology consult.  Harlene Schimke, RN3, BSN, CRRN, [facility]

## 2024-02-08 NOTE — ED Notes (Signed)
 NT did bladder scan on pt pt is holding 320 mL

## 2024-02-08 NOTE — Progress Notes (Signed)
 NEW ADMISSION NOTE New Admission Note:   Arrival Method: walked from stretcher to bed, gait steady Mental Orientation: alert and oriented x 4 Telemetry: not ordered Assessment: Completed Skin: see LDA, skin darker on BLL IV: PIV in R arm Pain:denies, but fearful of planned for urinary catheter; had coude for about a month in the past Tubes: none Safety Measures: Safety Fall Prevention Plan has been given, discussed and signed Admission: Completed [facility] Orientation: Patient has been orientated to the room, unit and staff.  Family: wife supportive and at bedside  Orders have been reviewed and implemented. Will continue to monitor the patient. Call light has been placed within reach and bed alarm has been activated.   After supplies arrived to unit coude catheter was unable to placed successfully. Dr. Georgina aware and consult was placed to urology. Almarie Andreas RN updated. Pt able to void and will measure in the future. Bladder scan showed over 450 ml  Clinton DELENA Miguel, RN

## 2024-02-08 NOTE — ED Notes (Signed)
 Charge aware patient coming upstairs.

## 2024-02-09 ENCOUNTER — Inpatient Hospital Stay (HOSPITAL_COMMUNITY)

## 2024-02-09 DIAGNOSIS — I5031 Acute diastolic (congestive) heart failure: Secondary | ICD-10-CM

## 2024-02-09 DIAGNOSIS — N179 Acute kidney failure, unspecified: Secondary | ICD-10-CM | POA: Diagnosis not present

## 2024-02-09 LAB — CBC WITH DIFFERENTIAL/PLATELET
Abs Immature Granulocytes: 0.13 K/uL — ABNORMAL HIGH (ref 0.00–0.07)
Basophils Absolute: 0 K/uL (ref 0.0–0.1)
Basophils Relative: 0 %
Eosinophils Absolute: 0.1 K/uL (ref 0.0–0.5)
Eosinophils Relative: 1 %
HCT: 42.5 % (ref 39.0–52.0)
Hemoglobin: 13.8 g/dL (ref 13.0–17.0)
Immature Granulocytes: 1 %
Lymphocytes Relative: 6 %
Lymphs Abs: 0.6 K/uL — ABNORMAL LOW (ref 0.7–4.0)
MCH: 28.7 pg (ref 26.0–34.0)
MCHC: 32.5 g/dL (ref 30.0–36.0)
MCV: 88.4 fL (ref 80.0–100.0)
Monocytes Absolute: 1.1 K/uL — ABNORMAL HIGH (ref 0.1–1.0)
Monocytes Relative: 11 %
Neutro Abs: 8 K/uL — ABNORMAL HIGH (ref 1.7–7.7)
Neutrophils Relative %: 81 %
Platelets: 299 K/uL (ref 150–400)
RBC: 4.81 MIL/uL (ref 4.22–5.81)
RDW: 14.5 % (ref 11.5–15.5)
WBC: 9.8 K/uL (ref 4.0–10.5)
nRBC: 0 % (ref 0.0–0.2)

## 2024-02-09 LAB — ECHOCARDIOGRAM COMPLETE
Est EF: 55
Height: 75 in
S' Lateral: 3.36 cm
Weight: 4518.55 [oz_av]

## 2024-02-09 LAB — RENAL FUNCTION PANEL
Albumin: 2.2 g/dL — ABNORMAL LOW (ref 3.5–5.0)
Anion gap: 16 — ABNORMAL HIGH (ref 5–15)
BUN: 96 mg/dL — ABNORMAL HIGH (ref 8–23)
CO2: 18 mmol/L — ABNORMAL LOW (ref 22–32)
Calcium: 8.5 mg/dL — ABNORMAL LOW (ref 8.9–10.3)
Chloride: 97 mmol/L — ABNORMAL LOW (ref 98–111)
Creatinine, Ser: 5.3 mg/dL — ABNORMAL HIGH (ref 0.61–1.24)
GFR, Estimated: 11 mL/min — ABNORMAL LOW (ref >60)
Glucose, Bld: 111 mg/dL — ABNORMAL HIGH (ref 70–99)
Phosphorus: 5.8 mg/dL — ABNORMAL HIGH (ref 2.5–4.6)
Potassium: 4 mmol/L (ref 3.5–5.1)
Sodium: 131 mmol/L — ABNORMAL LOW (ref 135–145)

## 2024-02-09 LAB — GLUCOSE, CAPILLARY: Glucose-Capillary: 134 mg/dL — ABNORMAL HIGH (ref 70–99)

## 2024-02-09 MED ORDER — HYOSCYAMINE SULFATE 0.125 MG PO TBDP
0.1250 mg | ORAL_TABLET | ORAL | Status: DC | PRN
  Filled 2024-02-09 (×2): qty 2

## 2024-02-09 MED ORDER — HYOSCYAMINE SULFATE 0.125 MG SL SUBL
0.1250 mg | SUBLINGUAL_TABLET | SUBLINGUAL | Status: DC | PRN
  Administered 2024-02-09: 0.125 mg via SUBLINGUAL
  Filled 2024-02-09 (×3): qty 1

## 2024-02-09 MED ORDER — ALFUZOSIN HCL ER 10 MG PO TB24
10.0000 mg | ORAL_TABLET | Freq: Every day | ORAL | Status: DC
  Administered 2024-02-09 – 2024-02-10 (×2): 10 mg via ORAL
  Filled 2024-02-09 (×2): qty 1

## 2024-02-09 MED ORDER — HYOSCYAMINE SULFATE 0.125 MG SL SUBL
0.2500 mg | SUBLINGUAL_TABLET | SUBLINGUAL | Status: DC | PRN
  Administered 2024-02-09 – 2024-02-10 (×2): 0.25 mg via SUBLINGUAL
  Filled 2024-02-09 (×3): qty 2

## 2024-02-09 MED ORDER — SODIUM CHLORIDE 0.9 % IV SOLN: 1.0000 g | INTRAVENOUS | Status: DC

## 2024-02-09 MED ORDER — BACITRACIN ZINC 500 UNIT/GM EX OINT
TOPICAL_OINTMENT | Freq: Three times a day (TID) | CUTANEOUS | Status: DC
  Administered 2024-02-09 – 2024-02-10 (×3): 31.5 via TOPICAL
  Filled 2024-02-09: qty 28.4

## 2024-02-09 MED ORDER — DULOXETINE HCL 20 MG PO CPEP
20.0000 mg | ORAL_CAPSULE | Freq: Every day | ORAL | Status: DC
  Administered 2024-02-09 – 2024-02-10 (×2): 20 mg via ORAL
  Filled 2024-02-09 (×2): qty 1

## 2024-02-09 NOTE — Consult Note (Signed)
 [facility] Renal Consultation Note  Requesting MD:  Indication for Consultation:   HPI:  [name] is a 68 y.o. male. PMHx significant of stage 4 CKD, afib on Eliquis , chronic diastolic CHF,  HTN, HLD, and BPH p/w sepsis 2/2 presumed urinary source and AKI. Patient follows outpatient nephrology, had a recent visit where he had labs done. Labs returned with worsening BUN/Cr, patient was advised to go to ED for management.  In the ED, pt hypotensive, tachycardic, and tachypneic on RA. Labs notable for Na 127, BUN/Cr 94/6.6, and WBC 13.8. UA positive LE and many bacteria. CXR w/o active disease. EDP started IV CTX, placed foley. We were consulted for AKI.   Patient reports feeling this AM compared to yesterday.   Creat  Date/Time Value Ref Range Status  12/11/2021 02:47 PM 2.05 (H) 0.70 - 1.35 mg/dL Final  94/88/7982 91:52 AM 1.10 0.70 - 1.33 mg/dL Final   Creatinine, Ser  Date/Time Value Ref Range Status  02/08/2024 02:03 PM 5.68 (H) 0.61 - 1.24 mg/dL Final  91/95/7974 88:78 AM 6.60 (H) 0.61 - 1.24 mg/dL Final  91/95/7974 89:47 AM 5.95 (H) 0.61 - 1.24 mg/dL Final  93/93/7974 97:97 PM 2.12 (H) 0.76 - 1.27 mg/dL Final  98/76/7974 91:49 AM 1.79 (H) 0.76 - 1.27 mg/dL Final  91/98/7975 89:76 AM 2.19 (H) 0.61 - 1.24 mg/dL Final  94/75/7975 96:64 PM 1.79 (H) 0.76 - 1.27 mg/dL Final  94/91/7975 97:44 PM 1.74 (H) 0.76 - 1.27 mg/dL Final  97/87/7975 96:72 AM 3.12 (H) 0.61 - 1.24 mg/dL Final  97/88/7975 88:89 AM 3.26 (H) 0.61 - 1.24 mg/dL Final  97/88/7975 88:90 AM 3.33 (H) 0.61 - 1.24 mg/dL Final  97/89/7975 95:93 AM 3.64 (H) 0.61 - 1.24 mg/dL Final  97/90/7975 98:93 AM 6.79 (H) 0.61 - 1.24 mg/dL Final  97/91/7975 96:64 AM 8.98 (H) 0.61 - 1.24 mg/dL Final  97/92/7975 94:46 PM 10.08 (H) 0.61 - 1.24 mg/dL Final  89/76/7976 95:92 AM 3.50 (H) 0.61 - 1.24 mg/dL Final  89/77/7976 95:65 AM 3.61 (H) 0.61 - 1.24 mg/dL Final  89/78/7976 88:74 AM 3.90 (H) 0.61 - 1.24 mg/dL Final   89/79/7976 88:92 AM 4.52 (H) 0.61 - 1.24 mg/dL Final  89/79/7976 94:43 AM 4.68 (H) 0.61 - 1.24 mg/dL Final  89/80/7976 94:92 PM 5.50 (H) 0.61 - 1.24 mg/dL Final  89/80/7976 95:53 PM 4.98 (H) 0.61 - 1.24 mg/dL Final  89/83/7977 96:59 AM 1.40 (H) 0.61 - 1.24 mg/dL Final  89/84/7977 96:50 AM 1.32 (H) 0.61 - 1.24 mg/dL Final  89/85/7977 96:47 AM 1.29 (H) 0.61 - 1.24 mg/dL Final  89/86/7977 98:86 AM 1.19 0.61 - 1.24 mg/dL Final  89/87/7977 98:66 AM 1.24 0.61 - 1.24 mg/dL Final  89/88/7977 95:92 PM 0.94 0.61 - 1.24 mg/dL Final  89/77/7978 95:98 AM 0.97 0.61 - 1.24 mg/dL Final  89/78/7978 95:90 AM 0.92 0.61 - 1.24 mg/dL Final  89/79/7978 95:67 AM 0.91 0.61 - 1.24 mg/dL Final  89/80/7978 92:91 PM 0.87 0.61 - 1.24 mg/dL Final  87/78/7979 95:97 PM 1.60 (H) 0.76 - 1.27 mg/dL Final  89/73/7979 88:62 AM 1.13 0.61 - 1.24 mg/dL Final  91/72/7979 96:48 PM 1.17 0.76 - 1.27 mg/dL Final  94/86/7979 98:75 PM 0.78 0.61 - 1.24 mg/dL Final  90/74/7981 97:98 PM 1.06 0.76 - 1.27 mg/dL Final  90/82/7981 95:44 PM 1.13 0.76 - 1.27 mg/dL Final  92/89/7985 90:73 AM 1.00 0.50 - 1.35 mg/dL Final  92/89/7985 90:79 AM 1.06 0.50 - 1.35 mg/dL Final  97/87/7985 98:57 PM  1.00 0.50 - 1.35 mg/dL Final  97/95/7986 91:77 AM 0.84 0.50 - 1.35 mg/dL Final  87/89/7987 98:92 PM 1.1 0.4 - 1.5 mg/dL Final  87/97/7989 88:59 AM 1.0 0.4 - 1.5 mg/dL Final  91/85/7989 94:60 PM 0.9 0.4 - 1.5 mg/dL Final  91/88/7989 93:69 AM 1.08 0.4 - 1.5 mg/dL Final  91/89/7989 94:73 AM 1.22 0.4 - 1.5 mg/dL Final     PMHx:   Past Medical History:  Diagnosis Date   Allergic rhinitis    Anxiety    Asthma    as a child   Cellulitis    Chronic kidney disease    Chronic low back pain    Complication of anesthesia    hard to put asleep, hard to wake up, nausea and vomiting   Depression    Dysrhythmia    ED (erectile dysfunction)    GERD (gastroesophageal reflux disease)    HLD (hyperlipidemia)    HTN (hypertension)    sees Dr. Lamar Quarry, [facility]   Hypertrophy of prostate with urinary obstruction and other lower urinary tract symptoms (LUTS)    IBS (irritable bowel syndrome)    Inguinal hernia without mention of obstruction or gangrene, unilateral or unspecified, (not specified as recurrent)    Insomnia    Lumbar herniated disc    L7   Morbid obesity (HCC)    Narcotic addiction (HCC)    NICM (nonischemic cardiomyopathy) (HCC)    tachycardia mediated,  resolved with sinus   Persistent atrial fibrillation (HCC)    ablation done 03/2006 at [facility]   Pneumonia    hx of 2004   PONV (postoperative nausea and vomiting)    Sleep apnea    Twitching    legs    Past Surgical History:  Procedure Laterality Date   ATRIAL ABLATION SURGERY  2007   [facility]   CARDIOVERSION  08/11/2011   Procedure: CARDIOVERSION;  Surgeon: Redell GORMAN Shallow, MD;  Location: [facility] OR;  Service: Cardiovascular;  Laterality: N/A;   CARDIOVERSION N/A 04/16/2017   Procedure: CARDIOVERSION;  Surgeon: Francyne Headland, MD;  Location: [facility] ENDOSCOPY;  Service: Cardiovascular;  Laterality: N/A;   CARDIOVERSION N/A 03/04/2019   Procedure: CARDIOVERSION;  Surgeon: Shallow Redell GORMAN, MD;  Location: [facility] ENDOSCOPY;  Service: Cardiovascular;  Laterality: N/A;   CARDIOVERSION N/A 07/04/2019   Procedure: CARDIOVERSION;  Surgeon: Raford Riggs, MD;  Location: [facility] ENDOSCOPY;  Service: Cardiovascular;  Laterality: N/A;   EYE SURGERY     lasik, 1998   HERNIA REPAIR     1977   INGUINAL HERNIA REPAIR Right 08/26/2012   Procedure: LAPAROSCOPIC INGUINAL HERNIA;  Surgeon: Redell Faith, DO;  Location: [facility] OR;  Service: General;  Laterality: Right;  laparoscopic right inguinal hernia repair with mesh, umbilical hernia repair   INSERTION OF MESH Right 08/26/2012   Procedure: INSERTION OF MESH;  Surgeon: Redell Faith, DO;  Location: [facility] OR;  Service: General;  Laterality: Right;  right inguinal hernia   IR FLUORO GUIDE CV LINE RIGHT  08/15/2022   IR US  GUIDE VASC ACCESS RIGHT   08/15/2022   TEE WITHOUT CARDIOVERSION N/A 03/04/2019   Procedure: TRANSESOPHAGEAL ECHOCARDIOGRAM (TEE);  Surgeon: Shallow Redell GORMAN, MD;  Location: [facility] ENDOSCOPY;  Service: Cardiovascular;  Laterality: N/A;   UMBILICAL HERNIA REPAIR N/A 08/26/2012   Procedure: HERNIA REPAIR UMBILICAL ADULT;  Surgeon: Redell Faith, DO;  Location: [facility] OR;  Service: General;  Laterality: N/A;    Family Hx:  Family History  Problem Relation Age of Onset  Asthma Mother    Breast cancer Mother    Hypertension Mother    COPD Father    Prostate cancer Father    Hypertension Father    Lymphoma Sister     Social History:  reports that he has never smoked. He has never used smokeless tobacco. He reports that he does not drink alcohol and does not use drugs.  Allergies:  Allergies  Allergen Reactions   Baclofen Other (See Comments)    Severe dizziness   Doxycycline  Hyclate Other (See Comments)    Flu-like symptoms   Gabapentin  Swelling and Other (See Comments)    Swelling in feet. Pt is tolerating this at home   Hycodan [Hydrocodone  Bit-Homatrop Mbr] Other (See Comments)    Dizziness    Hydrocodone -Acetaminophen  Other (See Comments)    GI upset, Dizziness   Pregabalin  Swelling and Other (See Comments)    Severly dry mouth    Medications: Prior to Admission medications   Medication Sig Start Date End Date Taking? Authorizing Provider  alfuzosin  (UROXATRAL ) 10 MG 24 hr tablet Take 10 mg by mouth daily with breakfast. 01/14/23   [provider]  amLODipine  (NORVASC ) 10 MG tablet Take 1 tablet (10 mg total) by mouth daily. 11/13/23   Pietro Redell RAMAN, MD  carvedilol  (COREG ) 25 MG tablet Take 1 tablet (25 mg total) by mouth 2 (two) times daily. 12/11/23 12/05/24  Pietro Redell RAMAN, MD  clonazePAM (KLONOPIN) 0.5 MG tablet Take 0.25-0.5 mg by mouth at bedtime as needed for anxiety. 10/10/22   [provider]  DULoxetine  (CYMBALTA ) 60 MG capsule Take 60 mg by mouth daily. 01/10/24   [provider]  ELIQUIS  5 MG TABS tablet TAKE 1 TABLET BY MOUTH TWICE DAILY 12/16/23   Pietro Redell RAMAN, MD  finasteride  (PROSCAR ) 5 MG tablet Take 1 tablet (5 mg total) by mouth daily. 04/28/22   Akula, Vijaya, MD  gabapentin  (NEURONTIN ) 100 MG capsule Take 1 capsule (100 mg total) by mouth at bedtime. 05/22/23 02/08/24  Lamount Ethan CROME, DPM  gabapentin  (NEURONTIN ) 100 MG capsule Take 3 capsules (300 mg total) by mouth at bedtime. 08/24/23   Onita Duos, MD  hydrALAZINE  (APRESOLINE ) 10 MG tablet Take 1 tablet (10 mg total) by mouth 3 (three) times daily as needed. 12/22/23   Pietro Redell RAMAN, MD  ketoconazole (NIZORAL) 2 % cream Apply topically 2 (two) times daily. 01/15/24   [provider]  lidocaine  (LIDODERM ) 5 % Place 2 patches onto the skin daily. Remove & Discard patch within 12 hours or as directed by MD 11/19/23   Lamount Ethan CROME, DPM  methocarbamol  (ROBAXIN ) 750 MG tablet Take 750 mg by mouth 2 (two) times daily as needed. 11/23/23   [provider]  pregabalin  (LYRICA ) 75 MG capsule Take 75 mg by mouth 2 (two) times daily. 01/09/24   [provider]  rOPINIRole  (REQUIP ) 1 MG tablet Take 1 mg by mouth in the morning and at bedtime. 12/07/23   [provider]  torsemide  (DEMADEX ) 20 MG tablet Take  40 mg alternating with 20 mg daily; take additional 20 mg daily as needed for worsening lower ext edema or weight gain of 3 lbs 12/15/23   Pietro Redell RAMAN, MD  traMADol  (ULTRAM ) 50 MG tablet Take 50 mg by mouth daily as needed. 09/08/23   [provider]  TYLENOL  500 MG tablet Take 1,500 mg by mouth as needed for mild pain or headache.    [provider]  Scheduled:  alfuzosin   10 mg Oral Q breakfast   apixaban   5 mg Oral BID   Chlorhexidine  Gluconate Cloth  6 each Topical Daily   DULoxetine   20 mg Oral Daily   feeding supplement (NEPRO CARB STEADY)  237 mL Oral BID BM   finasteride   5 mg Oral Daily   lidocaine   1 Application Urethral Once   sodium  bicarbonate  650 mg Oral TID   Continuous:  sodium chloride  50 mL/hr at 02/08/24 1520   cefTRIAXone  (ROCEPHIN )  IV      Labs:  Results for orders placed or performed during the hospital encounter of 02/08/24 (from the past 48 hours)  Urinalysis, Routine w reflex microscopic -Urine, Clean Catch     Status: Abnormal   Collection Time: 02/08/24 10:45 AM  Result Value Ref Range   Color, Urine YELLOW YELLOW   APPearance TURBID (A) CLEAR   Specific Gravity, Urine 1.010 1.005 - 1.030   pH 6.0 5.0 - 8.0   Glucose, UA 50 (A) NEGATIVE mg/dL   Hgb urine dipstick MODERATE (A) NEGATIVE   Bilirubin Urine NEGATIVE NEGATIVE   Ketones, ur NEGATIVE NEGATIVE mg/dL   Protein, ur 899 (A) NEGATIVE mg/dL   Nitrite NEGATIVE NEGATIVE   Leukocytes,Ua MODERATE (A) NEGATIVE   RBC / HPF >50 0 - 5 RBC/hpf   WBC, UA >50 0 - 5 WBC/hpf   Bacteria, UA MANY (A) NONE SEEN   Squamous Epithelial / HPF 0-5 0 - 5 /HPF   WBC Clumps PRESENT    Non Squamous Epithelial 6-10 (A) NONE SEEN    Comment: Performed at [facility], [address], [place], [place] [address]  CBC     Status: Abnormal   Collection Time: 02/08/24 10:52 AM  Result Value Ref Range   WBC 13.8 (H) 4.0 - 10.5 K/uL   RBC 4.94 4.22 - 5.81 MIL/uL   Hemoglobin 14.2 13.0 - 17.0 g/dL   HCT 56.8 60.9 - 47.9 %   MCV 87.2 80.0 - 100.0 fL   MCH 28.7 26.0 - 34.0 pg   MCHC 32.9 30.0 - 36.0 g/dL   RDW 85.5 88.4 - 84.4 %   Platelets 274 150 - 400 K/uL   nRBC 0.0 0.0 - 0.2 %    Comment: Performed at [facility], [address] [place], [place] [address]  Basic metabolic panel     Status: Abnormal   Collection Time: 02/08/24 10:52 AM  Result Value Ref Range   Sodium 129 (L) 135 - 145 mmol/L   Potassium 4.2 3.5 - 5.1 mmol/L   Chloride 98 98 - 111 mmol/L   CO2 14 (L) 22 - 32 mmol/L   Glucose, Bld 126 (H) 70 - 99 mg/dL    Comment: Glucose reference range applies only to samples taken after fasting for at least 8 hours.   BUN 102 (H) 8 - 23  mg/dL   Creatinine, Ser 4.04 (H) 0.61 - 1.24 mg/dL   Calcium  8.5 (L) 8.9 - 10.3 mg/dL   GFR, Estimated 10 (L) >60 mL/min    Comment: (NOTE) Calculated using the CKD-EPI Creatinine Equation (2021)    Anion gap 17 (H) 5 - 15    Comment: Performed at [facility], [address] [place], [place] [address]  Magnesium      Status: None   Collection Time: 02/08/24 10:52 AM  Result Value Ref Range   Magnesium  2.2 1.7 - 2.4 mg/dL    Comment: Performed  at [facility], [address] [place], [place] [address]  I-stat chem 8, ED     Status: Abnormal   Collection Time: 02/08/24 11:21 AM  Result Value Ref Range   Sodium 127 (L) 135 - 145 mmol/L   Potassium 4.2 3.5 - 5.1 mmol/L   Chloride 101 98 - 111 mmol/L   BUN 94 (H) 8 - 23 mg/dL   Creatinine, Ser 3.39 (H) 0.61 - 1.24 mg/dL   Glucose, Bld 872 (H) 70 - 99 mg/dL    Comment: Glucose reference range applies only to samples taken after fasting for at least 8 hours.   Calcium , Ion 1.08 (L) 1.15 - 1.40 mmol/L   TCO2 16 (L) 22 - 32 mmol/L   Hemoglobin 15.6 13.0 - 17.0 g/dL   HCT 53.9 60.9 - 47.9 %  Culture, blood (routine x 2)     Status: None (Preliminary result)   Collection Time: 02/08/24  1:57 PM   Specimen: BLOOD RIGHT ARM  Result Value Ref Range   Specimen Description BLOOD RIGHT ARM    Special Requests      BOTTLES DRAWN AEROBIC AND ANAEROBIC Blood Culture results may not be optimal due to an inadequate volume of blood received in culture bottles   Culture      NO GROWTH < 24 HOURS Performed at [facility], [address] [place], [place] [address]    Report Status PENDING   HIV Antibody (routine testing w rflx)     Status: None   Collection Time: 02/08/24  2:03 PM  Result Value Ref Range   HIV Screen 4th Generation wRfx Non Reactive Non Reactive    Comment: Performed at [facility], [address] [place], [place] [address]  CBC     Status: Abnormal   Collection Time: 02/08/24  2:03 PM  Result Value  Ref Range   WBC 12.8 (H) 4.0 - 10.5 K/uL   RBC 4.65 4.22 - 5.81 MIL/uL   Hemoglobin 13.6 13.0 - 17.0 g/dL   HCT 58.2 60.9 - 47.9 %   MCV 89.7 80.0 - 100.0 fL   MCH 29.2 26.0 - 34.0 pg   MCHC 32.6 30.0 - 36.0 g/dL   RDW 85.5 88.4 - 84.4 %   Platelets 332 150 - 400 K/uL   nRBC 0.0 0.0 - 0.2 %    Comment: Performed at [facility], [address], [place], [place] [address]  Creatinine, serum     Status: Abnormal   Collection Time: 02/08/24  2:03 PM  Result Value Ref Range   Creatinine, Ser 5.68 (H) 0.61 - 1.24 mg/dL   GFR, Estimated 10 (L) >60 mL/min    Comment: (NOTE) Calculated using the CKD-EPI Creatinine Equation (2021) Performed at [facility], [address], [place], [place] [address]   Procalcitonin     Status: None   Collection Time: 02/08/24  2:03 PM  Result Value Ref Range   Procalcitonin 1.42 ng/mL    Comment:        Interpretation: PCT > 0.5 ng/mL and <= 2 ng/mL: Systemic infection (sepsis) is possible, but other conditions are known to elevate PCT as well. (NOTE)       Sepsis PCT Algorithm           Lower Respiratory Tract  Infection PCT Algorithm    ----------------------------     ----------------------------         PCT < 0.25 ng/mL                PCT < 0.10 ng/mL          Strongly encourage             Strongly discourage   discontinuation of antibiotics    initiation of antibiotics    ----------------------------     -----------------------------       PCT 0.25 - 0.50 ng/mL            PCT 0.10 - 0.25 ng/mL               OR       >80% decrease in PCT            Discourage initiation of                                            antibiotics      Encourage discontinuation           of antibiotics    ----------------------------     -----------------------------         PCT >= 0.50 ng/mL              PCT 0.26 - 0.50 ng/mL                AND       <80% decrease in PCT             Encourage initiation of                                              antibiotics       Encourage continuation           of antibiotics    ----------------------------     -----------------------------        PCT >= 0.50 ng/mL                  PCT > 0.50 ng/mL               AND         increase in PCT                  Strongly encourage                                      initiation of antibiotics    Strongly encourage escalation           of antibiotics                                     -----------------------------                                           PCT <= 0.25 ng/mL  OR                                        > 80% decrease in PCT                                      Discontinue / Do not initiate                                             antibiotics  Performed at [facility], [address], [place], [place] [address]   Brain natriuretic peptide     Status: None   Collection Time: 02/08/24  2:03 PM  Result Value Ref Range   B Natriuretic Peptide 86.4 0.0 - 100.0 pg/mL    Comment: Performed at [facility], [address] [place], [place] [address]  Culture, blood (routine x 2)     Status: None (Preliminary result)   Collection Time: 02/08/24  2:04 PM   Specimen: BLOOD RIGHT HAND  Result Value Ref Range   Specimen Description BLOOD RIGHT HAND    Special Requests      BOTTLES DRAWN AEROBIC AND ANAEROBIC Blood Culture results may not be optimal due to an inadequate volume of blood received in culture bottles   Culture      NO GROWTH < 24 HOURS Performed at [facility], [address], [place], [place] [address]    Report Status PENDING   I-Stat Lactic Acid     Status: None   Collection Time: 02/08/24  2:09 PM  Result Value Ref Range   Lactic Acid, Venous 0.7 0.5 - 1.9 mmol/L     ROS:  Pertinent items are noted in HPI.  Physical Exam: Vitals:   02/08/24 2024 02/09/24 0552  BP: 114/88 98/66  Pulse: (!) 103 87  Resp:  18 18  Temp: 97.7 F (36.5 C) (!) 97.5 F (36.4 C)  SpO2: 96% 96%     General: Appears well, no acute distress  Heart: Regular rate Lungs: Breathing comfortably, no wheezing or crackles Abdomen: Soft, nontender, nondistended Extremities: No lower extremity edema bilaterally Skin: Warm to touch, perfusing well Neuro: Awake, alert, answering questions appropriately, no focal deficits  Assessment/Plan:  AKI on CKD Stage IIIb  Severe bilateral hydronephrosis Traumatic Foley catheter attempt s/p aqua ablation   Presented with Scr 5.95, BUN 102 with symptoms of lethargy ongoing for several months. Follows Dr Norine at [facility], was noted to have serum creatinine 5.37, BUN 87.  Patient also noted that he was taking high-dose gabapentin  300 mg 3 times daily.  UA showed moderate hemoglobin, moderate leukocytes, many bacteria, urine cultures pending.  Renal ultrasound notable for persistent severe bilateral hydronephrosis and increased renal echogenicity.    Urology consulted for difficult Foley placement which was placed and noted for over 600 mL returned. Patient is currently on finasteride  5 mg daily and alfuzosin  10 mg daily. AKI likely in the setting of obstruction. We will monitor RFP closely. No need for urgent dialysis.   - Monitor Daily RFPs - Monitor Daily I/Os  - Maintain MAP>65 for optimal renal perfusion.  - Avoid nephrotoxic agents such as IV contrast,  NSAIDs, and phosphate containing bowel preps (FLEETS)  Permanent A-fib permanent A-fib On Eliquis , managed by primary  HFpEF - Managed by primary    Toma Edwards PGY 2  02/09/2024, 8:24 AM

## 2024-02-09 NOTE — Plan of Care (Signed)

## 2024-02-09 NOTE — Progress Notes (Signed)
 [facility] ROUNDING NOTE [name] FMW:[id number]  DOB: 05-29-1956  DOA: 02/08/2024  PCP: Arloa Elsie SAUNDERS, MD  02/09/2024,7:18 AM  LOS: 1 day    Code Status:  full     From:  home  Current Dispo: unclear    74 m declared CKD4--follows closely with neprhology Dr. Lonni previously been admitted for AKI requiring 1-2 sessions of dialysis but is not currently on dialysis Cath 2004 ef 45%, fib perm--dccv dumc 2007--not a candidate for surgical MAZE/MV repair [severe obesity/severe LAE per Dr. Dusty Ngo obesity--Wegovy declined per PCP notes BOOP causing AKI/ESRD LE severe edema prior using Unna boots Sees [place] neurosurgery Dr. Darlis in [place], locally [employer] [facility] Ablation of prostate 02/20/2023 dr eskridge Dm ty ii with last A1c 5.9  8/4 send [facility] ED PCP worsening kidney function slight confusion Per patient baseline kidney function shows creatinine of 4 Sodium 127 potassium 4.2 BUN/creatinine 94/6.6 (baseline is 37/2.1)-UA showed many bacteria moderate leukocytes negative nitrites 100 proteinuria BNP is 88 procalcitonin 1.4 lactic acid 0.7 WBC 13.8 hemoglobin 14.2 platelet 274 HIV nonreactive CXR 1 view no active disease Ultrasound of kidneys showed severe hydronephrosis bilaterally (was read as nephrolithiasis)  Plan  AKI presumed secondary to Boop but more likely secondary to progression of underlying cardiac/renal disease?  Cardiorenal syndrome 2 Continue oral bicarb 650 3 times daily, minimize meds with potential for encephalopathy-holding Robaxin  500 every 8 gabapentin  300 at bedtime, tramadol  50, will need to dose adjust Cymbalta  downwards-20 mg now Continue finasteride  5, uro-Trexall 10 Given hypotension discontinue all blood pressure meds inclusive for rate control see below Nephrology has been consulted for opinion-previously has had HD on one of his prior admissions but did not need further, was not declared ESRD Urology consulted given mild pressure,  PVR ~ 300 and small amounts of urine--MAy need Coude  NICM cath 2004 not a candidate for maze with severe MV not a candidate for surgical intervention previously per CVTS Last echo 2023 not really suggestive of severe MV?  Will repeat the same this morning Holding torsemide  alternating dosing and giving saline currently 50 cc/H Will need to reevaluate daily  Permanent A-fib failed DCCV 2017 [facility] not a candidate for surgical management-CHA2DS2-VASc >4 on Eliquis  Holding eliquis  for now PTA on Coreg  25 twice daily which has been held-keep on monitors today get magnesium   Moderate obesity BMI 35.3 Not a candidate for [facility] per review of outpatient notes this was declined by insurance  Previous ablation of prostate 2024 Dr. Nieves Outpatient follow-up Dr. Ardie alfuzosin  10 continues Proscar  5  Severe lower extremity edema previously using Unna boots-previously on diuretics Monitor and keep Unna boots on  DM TY 2 last A1c 5.9 Not on glycemic control presumably because of progressive renal disease  Sees [facility] [place] Dr. Eichman for some reason?  Leukocytosis NOS Hematuria? Urine culture never obtained blood culture from 8/4 pending-reordered urine culture Continue ceftriaxone  1 g     Data Reviewed:   No labs this morning please get  DVT prophylaxis: Eliquis  from PTA  Status is: Inpatient Remains inpatient appropriate because:   Probably requires initiation HD this admission and consultant input     Subjective: Awake pelasan tin nad no focal deficit Some discomfort in abd--mild--more concerned about the sediment and colour of urine Usually is quite spry--does ADL,,, Iadl--lives with wife 43 yr at home    Objective + exam Vitals:   02/08/24 1454 02/08/24 1652 02/08/24 2024 02/09/24 0552  BP:  113/76 104/75 114/88 98/66  Pulse: (!) 58 98 (!) 103 87  Resp: 18 18 18 18   Temp:  97.6 F (36.4 C) 97.7 F (36.5 C) (!) 97.5 F (36.4 C)   TempSrc:   Oral Oral  SpO2: 95% 99% 96% 96%  Weight:      Height:       Filed Weights   02/08/24 1454  Weight: 128.1 kg     Examination: Eomi nca tpleasant thick neck mallampatti 4, no I/ pallor, \ S1 s 2not on tele Cta b no whheeze rales rhonchi Grade 2 LE pitting edema Neuro intaxc tAO x 4     Scheduled Meds:  alfuzosin   10 mg Oral Q breakfast   apixaban   5 mg Oral BID   Chlorhexidine  Gluconate Cloth  6 each Topical Daily   DULoxetine   20 mg Oral Daily   feeding supplement (NEPRO CARB STEADY)  237 mL Oral BID BM   finasteride   5 mg Oral Daily   lidocaine   1 Application Urethral Once   sodium bicarbonate   650 mg Oral TID   Continuous Infusions:  sodium chloride  50 mL/hr at 02/08/24 1520   cefTRIAXone  (ROCEPHIN )  IV      Time 70 minutes  Jai-Gurmukh Loreley Schwall, MD  [facility]

## 2024-02-09 NOTE — TOC CM/SW Note (Signed)
 Transition of Care [facility]) - Inpatient Brief Assessment   Patient Details  Name: [name] MRN: [id number] Date of Birth: Mar 04, 1956  Transition of [facility]) CM/SW Contact:    Tom-Johnson, Harvest Muskrat, RN Phone Number: 02/09/2024, 4:02 PM   Clinical Narrative:  Patient sent to the ED with worsening BUN/Cr by his Nephrologist. Workup showed recurrent bilateral Hydronephrosis, UTI. Currently on IV abx. Urology consulted for Caude Catheter d/t difficulty placing Foley Catheter. Nephrology following for AKI.  Has hx of CKD 4, A-Fib on Eliquis , Chronic Diastolic CHF,  HTN, HLD, and BPH.  From home with wife, has three supportive children. Retired, modified independent with a walker, w/c, shower seat and rails at home.  PCP is Arloa Elsie SAUNDERS, MD and uses [facility] on [address]   No TOC needs or recommendations noted at this time.  Patient not Medically ready for discharge.  CM will continue to follow as patient progresses with care towards discharge.              Transition of Care Asessment: Insurance and Status: Insurance coverage has been reviewed Patient has primary care physician: Yes Home environment has been reviewed: Yes Prior level of function:: Modified Independent Prior/Current Home Services: No current home services Social Drivers of Health Review: SDOH reviewed no interventions necessary Readmission risk has been reviewed: Yes Transition of care needs: no transition of care needs at this time

## 2024-02-09 NOTE — Consult Note (Addendum)
 Urology Consult Note   Requesting Attending Physician:  Samtani, Jai-Gurmukh, MD Service Providing Consult: Urology  Consulting Attending:    Reason for Consult: Difficult Foley placement, CKD  HPI: [name] is seen in consultation for reasons noted above at the request of Royal Sill, MD. patient is a 68 year old male known to our practice and followed by Dr. Nieves.  S/p aqua ablation approximately 1 year ago.  He was last seen in clinic in November 2024.  There was some sign of improvement of his severe bilateral hydroureteronephrosis.  Patient presents from his PCPs office to [facility] emergency department for severely elevated serum creatinine.  Patient reports that he has been undergoing aggressive diuresis with his PCP to control significant lower extremity edema.  He was admitted for acute on chronic kidney injury.  On arrival patient was alert, oriented, and in no distress.  He was resting comfortably in bed and accompanied by his wife.  They were both excellent historians.  He did not report any sensation of discomfort or urinary retention at that time.  We reviewed case and plan and all questions were answered to their satisfaction. ------------------  Assessment:   68 y.o. male with CKD and Hx of bladder outlet obstruction, s/p aqua ablation 02/20/2023   Recommendations: # Chronic severe bilateral hydroureteronephrosis #AoCKD # Possible UTI  Patient receiving ceftriaxone .  Urinalysis with 6-10 squamous cells, and indicating contamination.  Consider short course.  Trend labs.  Peak SCr of 6.60, 2.0 two months ago and comparable to 1 year ago.  S/p aggressive diuresis.  Reports 30 pound fluid loss in 1 month.  S/p aquablation with Dr. Nieves on 02/20/2023.  Renal ultrasound consistent with known severe bilateral hydroureter.  Continue finasteride   Hyoscyamine  provided for bladder spasm-as needed  PVR of around last night.  Some bleeding  and urinary retention after attempted catheter placement.  PVR around this afternoon, though over was returned once Foley catheter was placed.  He probably does have some edema causing retention after traumatic Foley placement.  Would proceed with voiding trial as soon as he shows renal improvement.  I will check on him tomorrow and follow peripherally  Case and plan discussed with Dr. Alvaro and Dr. Royal  Past Medical History: Past Medical History:  Diagnosis Date   Allergic rhinitis    Anxiety    Asthma    as a child   Cellulitis    Chronic kidney disease    Chronic low back pain    Complication of anesthesia    hard to put asleep, hard to wake up, nausea and vomiting   Depression    Dysrhythmia    ED (erectile dysfunction)    GERD (gastroesophageal reflux disease)    HLD (hyperlipidemia)    HTN (hypertension)    sees Dr. Lamar Quarry, [facility]   Hypertrophy of prostate with urinary obstruction and other lower urinary tract symptoms (LUTS)    IBS (irritable bowel syndrome)    Inguinal hernia without mention of obstruction or gangrene, unilateral or unspecified, (not specified as recurrent)    Insomnia    Lumbar herniated disc    L7   Morbid obesity (HCC)    Narcotic addiction (HCC)    NICM (nonischemic cardiomyopathy) (HCC)    tachycardia mediated,  resolved with sinus   Persistent atrial fibrillation (HCC)    ablation done 03/2006 at [facility]   Pneumonia    hx of 2004   PONV (postoperative nausea and  vomiting)    Sleep apnea    Twitching    legs    Past Surgical History:  Past Surgical History:  Procedure Laterality Date   ATRIAL ABLATION SURGERY  2007   [facility]   CARDIOVERSION  08/11/2011   Procedure: CARDIOVERSION;  Surgeon: Redell GORMAN Shallow, MD;  Location: [facility] OR;  Service: Cardiovascular;  Laterality: N/A;   CARDIOVERSION N/A 04/16/2017   Procedure: CARDIOVERSION;  Surgeon: Francyne Headland, MD;  Location: [facility] ENDOSCOPY;  Service:  Cardiovascular;  Laterality: N/A;   CARDIOVERSION N/A 03/04/2019   Procedure: CARDIOVERSION;  Surgeon: Shallow Redell GORMAN, MD;  Location: [facility] ENDOSCOPY;  Service: Cardiovascular;  Laterality: N/A;   CARDIOVERSION N/A 07/04/2019   Procedure: CARDIOVERSION;  Surgeon: Raford Riggs, MD;  Location: [facility] ENDOSCOPY;  Service: Cardiovascular;  Laterality: N/A;   EYE SURGERY     lasik, 1998   HERNIA REPAIR     1977   INGUINAL HERNIA REPAIR Right 08/26/2012   Procedure: LAPAROSCOPIC INGUINAL HERNIA;  Surgeon: Redell Faith, DO;  Location: [facility] OR;  Service: General;  Laterality: Right;  laparoscopic right inguinal hernia repair with mesh, umbilical hernia repair   INSERTION OF MESH Right 08/26/2012   Procedure: INSERTION OF MESH;  Surgeon: Redell Faith, DO;  Location: [facility] OR;  Service: General;  Laterality: Right;  right inguinal hernia   IR FLUORO GUIDE CV LINE RIGHT  08/15/2022   IR US  GUIDE VASC ACCESS RIGHT  08/15/2022   TEE WITHOUT CARDIOVERSION N/A 03/04/2019   Procedure: TRANSESOPHAGEAL ECHOCARDIOGRAM (TEE);  Surgeon: Shallow Redell GORMAN, MD;  Location: [facility] ENDOSCOPY;  Service: Cardiovascular;  Laterality: N/A;   UMBILICAL HERNIA REPAIR N/A 08/26/2012   Procedure: HERNIA REPAIR UMBILICAL ADULT;  Surgeon: Redell Faith, DO;  Location: [facility] OR;  Service: General;  Laterality: N/A;    Medication: Current Facility-Administered Medications  Medication Dose Route Frequency Provider Last Rate Last Admin   0.9 %  sodium chloride  infusion   Intravenous Continuous Georgina Basket, MD 50 mL/hr at 02/08/24 1520 New Bag at 02/08/24 1520   alfuzosin  (UROXATRAL ) 24 hr tablet 10 mg  10 mg Oral Q breakfast Samtani, Jai-Gurmukh, MD   10 mg at 02/09/24 1115   apixaban  (ELIQUIS ) tablet 5 mg  5 mg Oral BID Georgina Basket, MD       cefTRIAXone  (ROCEPHIN ) 1 g in sodium chloride  0.9 % 100 mL IVPB  1 g Intravenous Q24H Moore, Willie, MD 200 mL/hr at 02/09/24 0932 1 g at 02/09/24 0932   Chlorhexidine  Gluconate Cloth 2 % PADS 6 each  6  each Topical Daily Georgina Basket, MD       DULoxetine  (CYMBALTA ) DR capsule 20 mg  20 mg Oral Daily Samtani, Jai-Gurmukh, MD   20 mg at 02/09/24 1010   feeding supplement (NEPRO CARB STEADY) liquid 237 mL  237 mL Oral BID BM Georgina Basket, MD       finasteride  (PROSCAR ) tablet 5 mg  5 mg Oral Daily Georgina Basket, MD   5 mg at 02/09/24 1010   lidocaine  (XYLOCAINE ) 2 % jelly 1 Application  1 Application Urethral Once Moore, Willie, MD       sodium bicarbonate  tablet 650 mg  650 mg Oral TID Moore, Willie, MD   650 mg at 02/09/24 1010    Allergies: Allergies  Allergen Reactions   Baclofen Other (See Comments)    Severe dizziness   Doxycycline  Hyclate Other (See Comments)    Flu-like symptoms   Gabapentin  Swelling and Other (See Comments)    Swelling  in feet. Pt is tolerating this at home   Hycodan [Hydrocodone  Bit-Homatrop Mbr] Other (See Comments)    Dizziness    Hydrocodone -Acetaminophen  Other (See Comments)    GI upset, Dizziness   Pregabalin  Swelling and Other (See Comments)    Severly dry mouth    Social History: Social History   Tobacco Use   Smoking status: Never   Smokeless tobacco: Never  Vaping Use   Vaping status: Never Used  Substance Use Topics   Alcohol use: No    Alcohol/week: 0.0 standard drinks of alcohol   Drug use: No    Family History Family History  Problem Relation Age of Onset   Asthma Mother    Breast cancer Mother    Hypertension Mother    COPD Father    Prostate cancer Father    Hypertension Father    Lymphoma Sister     Review of Systems  Genitourinary:  Negative for dysuria, flank pain, frequency, hematuria and urgency.     Objective   Vital signs in last 24 hours: BP 98/66 (BP Location: Left Arm)   Pulse 87   Temp (!) 97.5 F (36.4 C) (Oral)   Resp 18   Ht 6' 3 (1.905 m)   Wt 128.1 kg   SpO2 96%   BMI 35.30 kg/m   Physical Exam General: A&O, resting, appropriate HEENT: [place]/AT Pulmonary: Normal work of  breathing Cardiovascular: no cyanosis Abdomen: Soft, NTTP, nondistended GU: 88f coud catheter in place draining cloudy yellow urine Neuro: Appropriate, no focal neurological deficits  Most Recent Labs: Lab Results  Component Value Date   WBC 12.8 (H) 02/08/2024   HGB 13.6 02/08/2024   HCT 41.7 02/08/2024   PLT 332 02/08/2024    Lab Results  Component Value Date   NA 127 (L) 02/08/2024   K 4.2 02/08/2024   CL 101 02/08/2024   CO2 14 (L) 02/08/2024   BUN 94 (H) 02/08/2024   CREATININE 5.68 (H) 02/08/2024   CALCIUM  8.5 (L) 02/08/2024   MG 2.2 02/08/2024   PHOS 2.9 08/17/2022    Lab Results  Component Value Date   INR 3.8 (A) 07/21/2019   APTT (H) 02/14/2009    166        IF BASELINE aPTT IS ELEVATED, SUGGEST PATIENT RISK ASSESSMENT BE USED TO DETERMINE APPROPRIATE ANTICOAGULANT THERAPY.     Urine Culture: @[id number] (laburin,org,[id number],[id number])@   IMAGING: ECHOCARDIOGRAM COMPLETE Result Date: 02/09/2024    ECHOCARDIOGRAM REPORT   Patient Name:   [name] Date of Exam: 02/09/2024 Medical Rec #:  [id number]         Height:       75.0 in Accession #:    [id number]        Weight:       282.4 lb Date of Birth:  1956-02-21        BSA:          2.541 m Patient Age:    67 years          BP:           98/66 mmHg Patient Gender: M                 HR:           90 bpm. Exam Location:  Inpatient Procedure: 2D Echo (Both Spectral and Color Flow Doppler were utilized during            procedure). Indications:    acute diastolic chf  History:        Patient has prior history of Echocardiogram examinations, most                 recent 04/25/2022. CHF and Cardiomyopathy, chronic kidney                 disease; Risk Factors:Hypertension, Dyslipidemia and Sleep                 Apnea.  Sonographer:    Tinnie Barefoot RDCS Referring Phys: [phone] [facility] SAMTANI IMPRESSIONS  1. Left ventricular ejection fraction, by estimation, is 55%. The left ventricle has normal function. The left  ventricle has no regional wall motion abnormalities. Left ventricular diastolic parameters are indeterminate.  2. Right ventricular systolic function is normal. The right ventricular size is normal.  3. Left atrial size was moderately dilated.  4. The mitral valve is abnormal. Mild mitral valve regurgitation. No evidence of mitral stenosis.  5. The aortic valve is tricuspid. Aortic valve regurgitation is not visualized. No aortic stenosis is present.  6. The inferior vena cava is normal in size with greater than 50% respiratory variability, suggesting right atrial pressure of 3 mmHg.  7. Cannot exclude a small PFO. FINDINGS  Left Ventricle: Left ventricular ejection fraction, by estimation, is 55%. The left ventricle has normal function. The left ventricle has no regional wall motion abnormalities. Strain was performed and the global longitudinal strain is indeterminate. The left ventricular internal cavity size was normal in size. There is no left ventricular hypertrophy. Left ventricular diastolic parameters are indeterminate. Right Ventricle: The right ventricular size is normal. No increase in right ventricular wall thickness. Right ventricular systolic function is normal. Left Atrium: Left atrial size was moderately dilated. Right Atrium: Right atrial size was normal in size. Pericardium: There is no evidence of pericardial effusion. Mitral Valve: The mitral valve is abnormal. There is mild thickening of the mitral valve leaflet(s). There is mild calcification of the mitral valve leaflet(s). Mild mitral valve regurgitation. No evidence of mitral valve stenosis. Tricuspid Valve: The tricuspid valve is normal in structure. Tricuspid valve regurgitation is trivial. No evidence of tricuspid stenosis. Aortic Valve: The aortic valve is tricuspid. Aortic valve regurgitation is not visualized. No aortic stenosis is present. Pulmonic Valve: The pulmonic valve was normal in structure. Pulmonic valve regurgitation is not  visualized. No evidence of pulmonic stenosis. Aorta: The aortic root is normal in size and structure. Venous: The inferior vena cava is normal in size with greater than 50% respiratory variability, suggesting right atrial pressure of 3 mmHg. IAS/Shunts: Cannot exclude a small PFO. Additional Comments: 3D was performed not requiring image post processing on an independent workstation and was indeterminate.  LEFT VENTRICLE PLAX 2D LVIDd:         5.34 cm LVIDs:         3.36 cm LV PW:         1.08 cm LV IVS:        1.16 cm LVOT diam:     2.20 cm LV SV:         44 LV SV Index:   17 LVOT Area:     3.80 cm  RIGHT VENTRICLE          IVC RV Basal diam:  3.58 cm  IVC diam: 1.98 cm TAPSE (M-mode): 1.9 cm LEFT ATRIUM              Index        RIGHT ATRIUM  Index LA diam:        5.12 cm  2.01 cm/m   RA Area:     26.30 cm LA Vol (A2C):   122.0 ml 48.01 ml/m  RA Volume:   84.80 ml  33.37 ml/m LA Vol (A4C):   121.0 ml 47.62 ml/m LA Biplane Vol: 123.0 ml 48.41 ml/m  AORTIC VALVE LVOT Vmax:   71.80 cm/s LVOT Vmean:  48.200 cm/s LVOT VTI:    0.115 m  AORTA Ao Root diam: 3.38 cm Ao Asc diam:  3.79 cm  SHUNTS Systemic VTI:  0.12 m Systemic Diam: 2.20 cm Maude Emmer MD Electronically signed by Maude Emmer MD Signature Date/Time: 02/09/2024/10:40:51 AM    Final    US  RENAL Result Date: 02/09/2024 CLINICAL DATA:  [id number] AKI (acute kidney injury) (HCC) [id number] EXAM: RENAL / URINARY TRACT ULTRASOUND COMPLETE COMPARISON:  CT abdomen pelvis 08/13/2022 FINDINGS: Right Kidney: Renal measurements: 16.2 x 6.9 x 7.5 cm = volume: 441 mL. Echogenicity increased. Simple superior renal cyst measuring 7.6 x 6.1 x 7.7 cm. No solid mass. Severe hydronephrosis visualized. Left Kidney: Renal measurements: 11.2 x 6.9 x 6.8 cm = volume: 273 mL. Echogenicity increased. No mass. Severe hydronephrosis visualized. Urinary bladder: Appears normal for degree of bladder distention. Other: None. IMPRESSION: 1. Persistent severe bilateral  nephrolithiasis. 2. Increased renal echogenicity suggestive of renal parenchymal disease. Electronically Signed   By: Morgane  Naveau M.D.   On: 02/09/2024 03:00   DG Chest Portable 1 View Result Date: 02/08/2024 CLINICAL DATA:  Bilateral leg weakness and kidney issues. EXAM: PORTABLE CHEST 1 VIEW COMPARISON:  08/13/2022 FINDINGS: Lungs are adequately inflated without focal airspace consolidation or effusion. Cardiomediastinal silhouette and remainder of the exam is unchanged. IMPRESSION: No active disease. Electronically Signed   By: Toribio Agreste M.D.   On: 02/08/2024 12:10    ------  Ole Bourdon, NP Pager: [phone]   Please contact the urology consult pager with any further questions/concerns.

## 2024-02-09 NOTE — Progress Notes (Signed)
  Echocardiogram 2D Echocardiogram has been performed.  [name] 02/09/2024, 8:56 AM

## 2024-02-09 NOTE — Procedures (Signed)
   Urology Procedure Note: 68 year old male known to our practice-s/p aqua ablation, now in urinary retention following traumatic Foley catheter attempt.  After consent was obtained, patient was prepped and draped in the usual sterile fashion.  He was quite nervous and controlled breathing was encouraged.  I injected 10 mL of viscous lidocaine  lubricant directly into the urethra.  After adequate time for anesthetic effect had been provided, I advanced an 21f coud catheter to the level of the bladder without resistance.  There was an immediate return of cloudy yellow urine.  Having confirmed placement the balloon was inflated with 10cc of sterile water .  The catheter was placed in a StatLock in the upper left inner thigh and placed to gravity drainage without dependent loops.  This concluded the procedure.   Ole Bourdon, NP [facility] Pager: [phone]

## 2024-02-09 NOTE — Plan of Care (Signed)
   Problem: Education: Goal: Knowledge of General Education information will improve Description: Including pain rating scale, medication(s)/side effects and non-pharmacologic comfort measures Outcome: Completed/Met

## 2024-02-10 DIAGNOSIS — N179 Acute kidney failure, unspecified: Secondary | ICD-10-CM | POA: Diagnosis not present

## 2024-02-10 LAB — CBC WITH DIFFERENTIAL/PLATELET
Abs Immature Granulocytes: 0.16 K/uL — ABNORMAL HIGH (ref 0.00–0.07)
Basophils Absolute: 0.1 K/uL (ref 0.0–0.1)
Basophils Relative: 1 %
Eosinophils Absolute: 0.1 K/uL (ref 0.0–0.5)
Eosinophils Relative: 1 %
HCT: 42.7 % (ref 39.0–52.0)
Hemoglobin: 14.2 g/dL (ref 13.0–17.0)
Immature Granulocytes: 2 %
Lymphocytes Relative: 7 %
Lymphs Abs: 0.7 K/uL (ref 0.7–4.0)
MCH: 29.3 pg (ref 26.0–34.0)
MCHC: 33.3 g/dL (ref 30.0–36.0)
MCV: 88 fL (ref 80.0–100.0)
Monocytes Absolute: 1.2 K/uL — ABNORMAL HIGH (ref 0.1–1.0)
Monocytes Relative: 12 %
Neutro Abs: 8.1 K/uL — ABNORMAL HIGH (ref 1.7–7.7)
Neutrophils Relative %: 77 %
Platelets: 350 K/uL (ref 150–400)
RBC: 4.85 MIL/uL (ref 4.22–5.81)
RDW: 14.5 % (ref 11.5–15.5)
WBC: 10.3 K/uL (ref 4.0–10.5)
nRBC: 0 % (ref 0.0–0.2)

## 2024-02-10 LAB — RENAL FUNCTION PANEL
Albumin: 2.3 g/dL — ABNORMAL LOW (ref 3.5–5.0)
Anion gap: 12 (ref 5–15)
BUN: 88 mg/dL — ABNORMAL HIGH (ref 8–23)
CO2: 18 mmol/L — ABNORMAL LOW (ref 22–32)
Calcium: 8.8 mg/dL — ABNORMAL LOW (ref 8.9–10.3)
Chloride: 100 mmol/L (ref 98–111)
Creatinine, Ser: 4.53 mg/dL — ABNORMAL HIGH (ref 0.61–1.24)
GFR, Estimated: 14 mL/min — ABNORMAL LOW (ref >60)
Glucose, Bld: 103 mg/dL — ABNORMAL HIGH (ref 70–99)
Phosphorus: 5 mg/dL — ABNORMAL HIGH (ref 2.5–4.6)
Potassium: 4.6 mmol/L (ref 3.5–5.1)
Sodium: 130 mmol/L — ABNORMAL LOW (ref 135–145)

## 2024-02-10 LAB — MAGNESIUM: Magnesium: 2.3 mg/dL (ref 1.7–2.4)

## 2024-02-10 MED ORDER — CEPHALEXIN 500 MG PO CAPS: 500.0000 mg | ORAL_CAPSULE | Freq: Two times a day (BID) | ORAL | 0 refills | Status: AC

## 2024-02-10 MED ORDER — FENTANYL CITRATE PF 50 MCG/ML IJ SOSY
12.5000 ug | PREFILLED_SYRINGE | Freq: Once | INTRAMUSCULAR | Status: AC
  Administered 2024-02-10: 12.5 ug via INTRAVENOUS
  Filled 2024-02-10: qty 1

## 2024-02-10 MED ORDER — DULOXETINE HCL 20 MG PO CPEP: 20.0000 mg | ORAL_CAPSULE | Freq: Every day | ORAL | 0 refills | Status: DC

## 2024-02-10 MED ORDER — SODIUM BICARBONATE 650 MG PO TABS: 650.0000 mg | ORAL_TABLET | Freq: Three times a day (TID) | ORAL | 0 refills | Status: DC

## 2024-02-10 MED ORDER — BACITRACIN ZINC 500 UNIT/GM EX OINT: TOPICAL_OINTMENT | Freq: Three times a day (TID) | CUTANEOUS | 0 refills | Status: DC

## 2024-02-10 NOTE — TOC Transition Note (Signed)
 Transition of Care [facility]) - Discharge Note   Patient Details  Name: [name] MRN: [id number] Date of Birth: 1955/07/18  [facility] CM/SW Contact:  Tom-Johnson, Harvest Muskrat, RN Phone Number: 02/10/2024, 12:18 PM   Clinical Narrative:     Patient is scheduled for discharge today.  Readmission Risk Assessment done. Outpatient f/u, hospital f/u and discharge instructions on AVS. No TOC needs or recommendations noted. Wife, [name] to transport at discharge.  No further TOC needs noted.       Final next level of care: Home/Self Care Barriers to Discharge: Barriers Resolved   Patient Goals and CMS Choice Patient states their goals for this hospitalization and ongoing recovery are:: To return home CMS Medicare.gov Compare Post Acute Care list provided to:: Patient Choice offered to / list presented to : NA      Discharge Placement                Patient to be transferred to facility by: Wife Name of family member notified: [name]    Discharge Plan and Services Additional resources added to the After Visit Summary for                  DME Arranged: N/A DME Agency: NA       HH Arranged: NA HH Agency: NA        Social Drivers of Health (SDOH) Interventions SDOH Screenings   Food Insecurity: No Food Insecurity (02/08/2024)  Housing: Low Risk  (02/08/2024)  Transportation Needs: No Transportation Needs (02/08/2024)  Utilities: Not At Risk (02/08/2024)  Alcohol Screen: Low Risk  (04/17/2021)  Financial Resource Strain: Low Risk  (04/17/2021)  Social Connections: Moderately Integrated (02/08/2024)  Tobacco Use: Low Risk  (02/08/2024)     Readmission Risk Interventions    02/09/2024    4:02 PM 04/28/2022    3:47 PM  Readmission Risk Prevention Plan  Transportation Screening Complete Complete  PCP or Specialist Appt within 5-7 Days Complete Complete  Home Care Screening Complete Complete  Medication Review (RN CM) Referral to Pharmacy  Complete

## 2024-02-10 NOTE — Progress Notes (Signed)
 AVS and discharge instruction completed.  Patient expressed verbal understanding.  NO PIV present at discharge.   Patient discharged to Main Entrance.

## 2024-02-10 NOTE — Discharge Summary (Signed)
 Physician Discharge Summary  MANINDER DEBOER FMW:[id number] DOB: 12-23-1955 DOA: 02/08/2024  PCP: Arloa Elsie SAUNDERS, MD  Admit date: 02/08/2024 Discharge date: 02/10/2024  Admitted From: Home Disposition: Home  Recommendations for Outpatient Follow-up:  Follow up with PCP in 1 week with repeat CBC/BMP Outpatient follow-up with urology and nephrology Follow up in ED if symptoms worsen or new appear   Home Health: No Equipment/Devices: None  Discharge Condition: Stable CODE STATUS: Full Diet recommendation: Heart healthy  Brief/Interim Summary: 68 year old male with history of CKD stage IV, A-fib on Eliquis , chronic diastolic CHF, hypertension, hyperlipidemia, BPH presented with sepsis secondary to presumed urinary source complicated by AKI. On presentation, sodium was 127, creatinine of 6.6, WBC of 13.8 with UA suggestive of UTI, chest x-ray with no active disease. He was started on IV antibiotics, IV fluids. Nephrology and urology were consulted.  Foley catheter was placed by urology on 02/09/2024.  Subsequently kidney function is improving.  Patient is adamant about going home today.  Nephrology and urology have cleared the patient for discharge.  Patient will be discharged home today with close outpatient follow-up with PCP, nephrology and urology.  Discharge Diagnoses:   AKI on CKD stage IV Acute metabolic acidosis - Presented with creatinine of 6.6.  Nephrology following.  Creatinine improving to 4.53 today. -Bicarb is 18 today.  Continue sodium bicarbonate  tablets. -Patient is adamant about going home today.  Nephrology and urology have cleared the patient for discharge.  -Patient will be discharged home today with close outpatient follow-up with PCP, nephrology and urology.  Outpatient follow-up of BMP.   Chronic severe bilateral hydronephrosis BPH - Status post Foley catheter placement by urology.  Urology following.  Continue finasteride  and alfuzosin .  Outpatient follow-up  with urology.   Sepsis: Present on admission UTI - Hemodynamically improving.  Cultures negative so far.  Currently on Rocephin .  Discharge on oral Keflex  for 4 more days.  Sepsis has resolved.   Hyperphosphatemia - Monitor as an outpatient.   Leukocytosis - Resolved   Permanent A-fib - Rate mostly controlled.  Continue Eliquis .  Coreg  on hold for now.  Outpatient follow-up with PCP regarding need for resumption of Coreg .    Chronic diastolic heart failure Hypertension - Currently compensated.  Continue diet and fluid restriction.  Torsemide  on hold.  Outpatient follow-up with cardiology.  Echo showed EF of 55%. -Blood pressure on the lower side.  Antihypertensives to remain on hold till reevaluation by PCP   Diabetes mellitus type 2 - Last A1c 5.9.  Diet controlled.  Outpatient follow-up   Anxiety/depression -Continue duloxetine : Dose has been reduced to 20 mg daily which will be continued on discharge.  Outpatient follow-up with PCP.   Obesity class II - Outpatient follow-up   Severe lower extremity edema - Torsemide  on hold.  Outpatient follow-up  Discharge Instructions  Discharge Instructions     Diet - low sodium heart healthy   Complete by: As directed    Increase activity slowly   Complete by: As directed    No wound care   Complete by: As directed       Allergies as of 02/10/2024       Reactions   Baclofen Other (See Comments)   Severe dizziness   Doxycycline  Hyclate Other (See Comments)   Flu-like symptoms   Gabapentin  Swelling, Other (See Comments)   Swelling in feet. Pt is tolerating this at home   Hycodan [hydrocodone  Bit-homatrop Mbr] Other (See Comments)   Dizziness   Hydrocodone -acetaminophen  Other (  See Comments)   GI upset, Dizziness   Pregabalin  Swelling, Other (See Comments)   Severly dry mouth        Medication List     STOP taking these medications    amLODipine  10 MG tablet Commonly known as: NORVASC    carvedilol  25 MG  tablet Commonly known as: COREG    clonazePAM 0.5 MG tablet Commonly known as: KLONOPIN   hydrALAZINE  10 MG tablet Commonly known as: APRESOLINE    pregabalin  75 MG capsule Commonly known as: LYRICA    torsemide  20 MG tablet Commonly known as: DEMADEX        TAKE these medications    alfuzosin  10 MG 24 hr tablet Commonly known as: UROXATRAL  Take 10 mg by mouth daily with breakfast.   bacitracin  ointment Apply topically 3 (three) times daily. Apply to tip of penis meatus   cephALEXin  500 MG capsule Commonly known as: KEFLEX  Take 1 capsule (500 mg total) by mouth 2 (two) times daily for 4 days.   DULoxetine  20 MG capsule Commonly known as: CYMBALTA  Take 1 capsule (20 mg total) by mouth daily. Start taking on: February 11, 2024 What changed:  medication strength how much to take   Eliquis  5 MG Tabs tablet Generic drug: apixaban  TAKE 1 TABLET BY MOUTH TWICE DAILY   finasteride  5 MG tablet Commonly known as: PROSCAR  Take 1 tablet (5 mg total) by mouth daily.   gabapentin  100 MG capsule Commonly known as: NEURONTIN  Take 1 capsule (100 mg total) by mouth at bedtime. What changed: when to take this   methocarbamol  750 MG tablet Commonly known as: ROBAXIN  Take 750 mg by mouth 2 (two) times daily as needed for muscle spasms.   rOPINIRole  1 MG tablet Commonly known as: REQUIP  Take 1-4 mg by mouth See admin instructions. Take 1mg  (1 tablet) by mouth at lunchtime and 4mg  (4 tablets) at night.   sodium bicarbonate  650 MG tablet Take 1 tablet (650 mg total) by mouth 3 (three) times daily.   traMADol  50 MG tablet Commonly known as: ULTRAM  Take 50 mg by mouth daily as needed for moderate pain (pain score 4-6).   TYLENOL  500 MG tablet Generic drug: acetaminophen  Take 1,500 mg by mouth as needed for mild pain or headache.        Follow-up Information     [facility], [facility] Follow up in 2 week(s).   Why: We will call with appointment and lab  details Contact information: [address] [place] [address] [phone]         Arloa Elsie SAUNDERS, MD. Schedule an appointment as soon as possible for a visit in 1 week(s).   Specialty: Family Medicine Why: with repeat bmp Contact information: [address] [place] [address] [phone]         [facility]. Schedule an appointment as soon as possible for a visit in 1 week(s).   Contact information: [address] [place]  [address] [phone]               Allergies  Allergen Reactions   Baclofen Other (See Comments)    Severe dizziness   Doxycycline  Hyclate Other (See Comments)    Flu-like symptoms   Gabapentin  Swelling and Other (See Comments)    Swelling in feet. Pt is tolerating this at home   Hycodan [Hydrocodone  Bit-Homatrop Mbr] Other (See Comments)    Dizziness    Hydrocodone -Acetaminophen  Other (See Comments)    GI upset, Dizziness  Pregabalin  Swelling and Other (See Comments)    Severly dry mouth    Consultations: Nephrology/urology   Procedures/Studies: ECHOCARDIOGRAM COMPLETE Result Date: 02/09/2024    ECHOCARDIOGRAM REPORT   Patient Name:   KHARI MALLY Date of Exam: 02/09/2024 Medical Rec #:  [id number]         Height:       75.0 in Accession #:    [id number]        Weight:       282.4 lb Date of Birth:  Aug 20, 1955        BSA:          2.541 m Patient Age:    67 years          BP:           98/66 mmHg Patient Gender: M                 HR:           90 bpm. Exam Location:  Inpatient Procedure: 2D Echo (Both Spectral and Color Flow Doppler were utilized during            procedure). Indications:    acute diastolic chf  History:        Patient has prior history of Echocardiogram examinations, most                 recent 04/25/2022. CHF and Cardiomyopathy, chronic kidney                 disease; Risk Factors:Hypertension, Dyslipidemia and Sleep                 Apnea.  Sonographer:     Tinnie Barefoot RDCS Referring Phys: [id number] [facility] SAMTANI IMPRESSIONS  1. Left ventricular ejection fraction, by estimation, is 55%. The left ventricle has normal function. The left ventricle has no regional wall motion abnormalities. Left ventricular diastolic parameters are indeterminate.  2. Right ventricular systolic function is normal. The right ventricular size is normal.  3. Left atrial size was moderately dilated.  4. The mitral valve is abnormal. Mild mitral valve regurgitation. No evidence of mitral stenosis.  5. The aortic valve is tricuspid. Aortic valve regurgitation is not visualized. No aortic stenosis is present.  6. The inferior vena cava is normal in size with greater than 50% respiratory variability, suggesting right atrial pressure of 3 mmHg.  7. Cannot exclude a small PFO. FINDINGS  Left Ventricle: Left ventricular ejection fraction, by estimation, is 55%. The left ventricle has normal function. The left ventricle has no regional wall motion abnormalities. Strain was performed and the global longitudinal strain is indeterminate. The left ventricular internal cavity size was normal in size. There is no left ventricular hypertrophy. Left ventricular diastolic parameters are indeterminate. Right Ventricle: The right ventricular size is normal. No increase in right ventricular wall thickness. Right ventricular systolic function is normal. Left Atrium: Left atrial size was moderately dilated. Right Atrium: Right atrial size was normal in size. Pericardium: There is no evidence of pericardial effusion. Mitral Valve: The mitral valve is abnormal. There is mild thickening of the mitral valve leaflet(s). There is mild calcification of the mitral valve leaflet(s). Mild mitral valve regurgitation. No evidence of mitral valve stenosis. Tricuspid Valve: The tricuspid valve is normal in structure. Tricuspid valve regurgitation is trivial. No evidence of tricuspid stenosis. Aortic Valve: The aortic valve  is tricuspid. Aortic valve regurgitation is not visualized. No aortic stenosis is present. Pulmonic Valve: The pulmonic  valve was normal in structure. Pulmonic valve regurgitation is not visualized. No evidence of pulmonic stenosis. Aorta: The aortic root is normal in size and structure. Venous: The inferior vena cava is normal in size with greater than 50% respiratory variability, suggesting right atrial pressure of 3 mmHg. IAS/Shunts: Cannot exclude a small PFO. Additional Comments: 3D was performed not requiring image post processing on an independent workstation and was indeterminate.  LEFT VENTRICLE PLAX 2D LVIDd:         5.34 cm LVIDs:         3.36 cm LV PW:         1.08 cm LV IVS:        1.16 cm LVOT diam:     2.20 cm LV SV:         44 LV SV Index:   17 LVOT Area:     3.80 cm  RIGHT VENTRICLE          IVC RV Basal diam:  3.58 cm  IVC diam: 1.98 cm TAPSE (M-mode): 1.9 cm LEFT ATRIUM              Index        RIGHT ATRIUM           Index LA diam:        5.12 cm  2.01 cm/m   RA Area:     26.30 cm LA Vol (A2C):   122.0 ml 48.01 ml/m  RA Volume:   84.80 ml  33.37 ml/m LA Vol (A4C):   121.0 ml 47.62 ml/m LA Biplane Vol: 123.0 ml 48.41 ml/m  AORTIC VALVE LVOT Vmax:   71.80 cm/s LVOT Vmean:  48.200 cm/s LVOT VTI:    0.115 m  AORTA Ao Root diam: 3.38 cm Ao Asc diam:  3.79 cm  SHUNTS Systemic VTI:  0.12 m Systemic Diam: 2.20 cm Maude Emmer MD Electronically signed by Maude Emmer MD Signature Date/Time: 02/09/2024/10:40:51 AM    Final    US  RENAL Result Date: 02/09/2024 CLINICAL DATA:  [id number] AKI (acute kidney injury) (HCC) [id number] EXAM: RENAL / URINARY TRACT ULTRASOUND COMPLETE COMPARISON:  CT abdomen pelvis 08/13/2022 FINDINGS: Right Kidney: Renal measurements: 16.2 x 6.9 x 7.5 cm = volume: 441 mL. Echogenicity increased. Simple superior renal cyst measuring 7.6 x 6.1 x 7.7 cm. No solid mass. Severe hydronephrosis visualized. Left Kidney: Renal measurements: 11.2 x 6.9 x 6.8 cm = volume: 273 mL. Echogenicity  increased. No mass. Severe hydronephrosis visualized. Urinary bladder: Appears normal for degree of bladder distention. Other: None. IMPRESSION: 1. Persistent severe bilateral nephrolithiasis. 2. Increased renal echogenicity suggestive of renal parenchymal disease. Electronically Signed   By: Morgane  Naveau M.D.   On: 02/09/2024 03:00   DG Chest Portable 1 View Result Date: 02/08/2024 CLINICAL DATA:  Bilateral leg weakness and kidney issues. EXAM: PORTABLE CHEST 1 VIEW COMPARISON:  08/13/2022 FINDINGS: Lungs are adequately inflated without focal airspace consolidation or effusion. Cardiomediastinal silhouette and remainder of the exam is unchanged. IMPRESSION: No active disease. Electronically Signed   By: Toribio Agreste M.D.   On: 02/08/2024 12:10      Subjective: Patient seen and examined at bedside. Denies worsening abdominal pain, fever or vomiting.  Adamant about going home today.  Discharge Exam: Vitals:   02/10/24 0428 02/10/24 0755  BP: 108/77 109/89  Pulse: (!) 49 (!) 56  Resp: 17 19  Temp: (!) 97.5 F (36.4 C) 98.3 F (36.8 C)  SpO2: 97% 99%    General: Pt  is alert, awake, not in acute distress.  Intermittently gets angry.  On room air.  Looks chronically ill and deconditioned. Cardiovascular: Emergently bradycardic S1/S2 + Respiratory: bilateral decreased breath sounds at bases Abdominal: Soft, obese, NT, ND, bowel sounds + Extremities: Lower extremity edema present; no cyanosis    The results of significant diagnostics from this hospitalization (including imaging, microbiology, ancillary and laboratory) are listed below for reference.     Microbiology: Recent Results (from the past 240 hours)  Culture, blood (routine x 2)     Status: None (Preliminary result)   Collection Time: 02/08/24  1:57 PM   Specimen: BLOOD RIGHT ARM  Result Value Ref Range Status   Specimen Description BLOOD RIGHT ARM  Final   Special Requests   Final    BOTTLES DRAWN AEROBIC AND ANAEROBIC  Blood Culture results may not be optimal due to an inadequate volume of blood received in culture bottles   Culture   Final    NO GROWTH 2 DAYS Performed at [facility], [address], [place], [place] [address]    Report Status PENDING  Incomplete  Culture, blood (routine x 2)     Status: None (Preliminary result)   Collection Time: 02/08/24  2:04 PM   Specimen: BLOOD RIGHT HAND  Result Value Ref Range Status   Specimen Description BLOOD RIGHT HAND  Final   Special Requests   Final    BOTTLES DRAWN AEROBIC AND ANAEROBIC Blood Culture results may not be optimal due to an inadequate volume of blood received in culture bottles   Culture   Final    NO GROWTH 2 DAYS Performed at [facility], [address], [place], [place] [address]    Report Status PENDING  Incomplete     Labs: BNP (last 3 results) Recent Labs    02/08/24 1403  BNP 86.4   Basic Metabolic Panel: Recent Labs  Lab 02/08/24 1052 02/08/24 1121 02/08/24 1403 02/09/24 1021 02/10/24 0524  NA 129* 127*  --  131* 130*  K 4.2 4.2  --  4.0 4.6  CL 98 101  --  97* 100  CO2 14*  --   --  18* 18*  GLUCOSE 126* 127*  --  111* 103*  BUN 102* 94*  --  96* 88*  CREATININE 5.95* 6.60* 5.68* 5.30* 4.53*  CALCIUM  8.5*  --   --  8.5* 8.8*  MG 2.2  --   --   --  2.3  PHOS  --   --   --  5.8* 5.0*   Liver Function Tests: Recent Labs  Lab 02/09/24 1021 02/10/24 0524  ALBUMIN 2.2* 2.3*   No results for input(s): LIPASE, AMYLASE in the last 168 hours. No results for input(s): AMMONIA in the last 168 hours. CBC: Recent Labs  Lab 02/08/24 1052 02/08/24 1121 02/08/24 1403 02/09/24 1021 02/10/24 0524  WBC 13.8*  --  12.8* 9.8 10.3  NEUTROABS  --   --   --  8.0* 8.1*  HGB 14.2 15.6 13.6 13.8 14.2  HCT 43.1 46.0 41.7 42.5 42.7  MCV 87.2  --  89.7 88.4 88.0  PLT 274  --  332 299 350   Cardiac Enzymes: No results for input(s): CKTOTAL, CKMB, CKMBINDEX, TROPONINI in the last 168  hours. BNP: Invalid input(s): POCBNP CBG: Recent Labs  Lab 02/09/24 1718  GLUCAP 134*   D-Dimer No results for input(s): DDIMER in the last 72 hours. Hgb A1c No results for  input(s): HGBA1C in the last 72 hours. Lipid Profile No results for input(s): CHOL, HDL, LDLCALC, TRIG, CHOLHDL, LDLDIRECT in the last 72 hours. Thyroid  function studies No results for input(s): TSH, T4TOTAL, T3FREE, THYROIDAB in the last 72 hours.  Invalid input(s): FREET3 Anemia work up No results for input(s): VITAMINB12, FOLATE, FERRITIN, TIBC, IRON, RETICCTPCT in the last 72 hours. Urinalysis    Component Value Date/Time   COLORURINE YELLOW 02/08/2024 1045   APPEARANCEUR TURBID (A) 02/08/2024 1045   LABSPEC 1.010 02/08/2024 1045   PHURINE 6.0 02/08/2024 1045   GLUCOSEU 50 (A) 02/08/2024 1045   HGBUR MODERATE (A) 02/08/2024 1045   BILIRUBINUR NEGATIVE 02/08/2024 1045   KETONESUR NEGATIVE 02/08/2024 1045   PROTEINUR 100 (A) 02/08/2024 1045   UROBILINOGEN 0.2 01/13/2013 1246   NITRITE NEGATIVE 02/08/2024 1045   LEUKOCYTESUR MODERATE (A) 02/08/2024 1045   Sepsis Labs Recent Labs  Lab 02/08/24 1052 02/08/24 1403 02/09/24 1021 02/10/24 0524  WBC 13.8* 12.8* 9.8 10.3   Microbiology Recent Results (from the past 240 hours)  Culture, blood (routine x 2)     Status: None (Preliminary result)   Collection Time: 02/08/24  1:57 PM   Specimen: BLOOD RIGHT ARM  Result Value Ref Range Status   Specimen Description BLOOD RIGHT ARM  Final   Special Requests   Final    BOTTLES DRAWN AEROBIC AND ANAEROBIC Blood Culture results may not be optimal due to an inadequate volume of blood received in culture bottles   Culture   Final    NO GROWTH 2 DAYS Performed at [facility], [address] [place], [place] [address]    Report Status PENDING  Incomplete  Culture, blood (routine x 2)     Status: None (Preliminary result)   Collection Time: 02/08/24  2:04 PM    Specimen: BLOOD RIGHT HAND  Result Value Ref Range Status   Specimen Description BLOOD RIGHT HAND  Final   Special Requests   Final    BOTTLES DRAWN AEROBIC AND ANAEROBIC Blood Culture results may not be optimal due to an inadequate volume of blood received in culture bottles   Culture   Final    NO GROWTH 2 DAYS Performed at [facility], [address] [place], [place] [address]    Report Status PENDING  Incomplete     Time coordinating discharge: 35 minutes  SIGNED:   Sophie Mao, MD  [facility] 02/10/2024, 11:29 AM

## 2024-02-10 NOTE — Progress Notes (Signed)
 Waiting for Urology to come see patient before discharging.

## 2024-02-10 NOTE — Progress Notes (Signed)
 Subjective: Patient is evaluated at bedside, reports that he feels a lot well this morning.  Patient reports that he wants to go home, states that he is unable to sleep here in the hospital.  Reports that he is very uncomfortable and does not have much of an appetite.  Objective Vital signs in last 24 hours: Vitals:   02/09/24 1717 02/09/24 2015 02/10/24 0428 02/10/24 0755  BP: 104/79 (!) 111/95 108/77 109/89  Pulse: (!) 107 97 (!) 49 (!) 56  Resp: 18 17 17 19   Temp: 98.2 F (36.8 C) (!) 97.5 F (36.4 C) (!) 97.5 F (36.4 C) 98.3 F (36.8 C)  TempSrc:  Oral Oral   SpO2: (!) 81% 95% 97% 99%  Weight:      Height:       Weight change:   Intake/Output Summary (Last 24 hours) at 02/10/2024 1123 Last data filed at 02/10/2024 0900 Gross per 24 hour  Intake 2505.48 ml  Output 2275 ml  Net 230.48 ml    Assessment/ Plan: SEYDOU HEARNS is a 68 y.o. male. PMHx significant of stage 4 CKD, afib on Eliquis , chronic diastolic CHF,  HTN, HLD, and BPH p/w sepsis 2/2 presumed urinary source and AKI. Patient follows outpatient nephrology, had a recent visit where he had labs done. Labs returned with worsening BUN/Cr, patient was advised to go to ED for management.  Patient is admitted for acute kidney injury on CKD stage IIIb, noted to have severe bilateral hydronephrosis, Foley catheter placed by urology with great urine output.  Assessment/Plan:  AKI on CKD Stage IIIb  Severe bilateral hydronephrosis Traumatic Foley catheter attempt s/p aqua ablation   Presented with Scr 5.95, BUN 102 with symptoms of lethargy ongoing for several months. Follows Dr Norine at [facility], was noted to have serum creatinine 5.37, BUN 87.  Patient also noted that he was taking high-dose gabapentin  300 mg 3 times daily.  UA showed moderate hemoglobin, moderate leukocytes, many bacteria. Renal ultrasound notable for persistent severe bilateral hydronephrosis and increased renal echogenicity.      Urology consulted on 8/5, status post Foley catheter placement. Patient is currently on finasteride  5 mg daily and alfuzosin  10 mg daily. AKI likely in the setting of obstruction.   Overall renal function improving slowly after the placement of Foley catheter with increased urine output.  From our standpoint, we can set up an outpatient follow-up for lab repeat and close follow-up.  - Serum creatinine 4.53 (5.30), BUN 88, potassium 4.6 - UOP 2.5 L in the past 24 hours. - Monitor Daily RFPs - Monitor Daily I/Os  - Maintain MAP>65 for optimal renal perfusion.  - Avoid nephrotoxic agents such as IV contrast, NSAIDs, and phosphate containing bowel preps (FLEETS)   Permanent A-fib permanent A-fib On Eliquis , managed by primary   HFpEF - Managed by primar   Labs: Basic Metabolic Panel: Recent Labs  Lab 02/08/24 1052 02/08/24 1121 02/08/24 1403 02/09/24 1021 02/10/24 0524  NA 129* 127*  --  131* 130*  K 4.2 4.2  --  4.0 4.6  CL 98 101  --  97* 100  CO2 14*  --   --  18* 18*  GLUCOSE 126* 127*  --  111* 103*  BUN 102* 94*  --  96* 88*  CREATININE 5.95* 6.60* 5.68* 5.30* 4.53*  CALCIUM  8.5*  --   --  8.5* 8.8*  PHOS  --   --   --  5.8* 5.0*   Liver Function Tests:  Recent Labs  Lab 02/09/24 1021 02/10/24 0524  ALBUMIN 2.2* 2.3*   No results for input(s): LIPASE, AMYLASE in the last 168 hours. No results for input(s): AMMONIA in the last 168 hours. CBC: Recent Labs  Lab 02/08/24 1052 02/08/24 1121 02/08/24 1403 02/09/24 1021 02/10/24 0524  WBC 13.8*  --  12.8* 9.8 10.3  NEUTROABS  --   --   --  8.0* 8.1*  HGB 14.2   < > 13.6 13.8 14.2  HCT 43.1   < > 41.7 42.5 42.7  MCV 87.2  --  89.7 88.4 88.0  PLT 274  --  332 299 350   < > = values in this interval not displayed.   Cardiac Enzymes: No results for input(s): CKTOTAL, CKMB, CKMBINDEX, TROPONINI in the last 168 hours. CBG: Recent Labs  Lab 02/09/24 1718  GLUCAP 134*    Iron Studies: No  results for input(s): IRON, TIBC, TRANSFERRIN, FERRITIN in the last 72 hours. Studies/Results: ECHOCARDIOGRAM COMPLETE Result Date: 02/09/2024    ECHOCARDIOGRAM REPORT   Patient Name:   [name] Date of Exam: 02/09/2024 Medical Rec #:  [id number]         Height:       75.0 in Accession #:    [id number]        Weight:       282.4 lb Date of Birth:  September 20, 1955        BSA:          2.541 m Patient Age:    67 years          BP:           98/66 mmHg Patient Gender: M                 HR:           90 bpm. Exam Location:  Inpatient Procedure: 2D Echo (Both Spectral and Color Flow Doppler were utilized during            procedure). Indications:    acute diastolic chf  History:        Patient has prior history of Echocardiogram examinations, most                 recent 04/25/2022. CHF and Cardiomyopathy, chronic kidney                 disease; Risk Factors:Hypertension, Dyslipidemia and Sleep                 Apnea.  Sonographer:    Tinnie Barefoot RDCS Referring Phys: [phone] [facility] SAMTANI IMPRESSIONS  1. Left ventricular ejection fraction, by estimation, is 55%. The left ventricle has normal function. The left ventricle has no regional wall motion abnormalities. Left ventricular diastolic parameters are indeterminate.  2. Right ventricular systolic function is normal. The right ventricular size is normal.  3. Left atrial size was moderately dilated.  4. The mitral valve is abnormal. Mild mitral valve regurgitation. No evidence of mitral stenosis.  5. The aortic valve is tricuspid. Aortic valve regurgitation is not visualized. No aortic stenosis is present.  6. The inferior vena cava is normal in size with greater than 50% respiratory variability, suggesting right atrial pressure of 3 mmHg.  7. Cannot exclude a small PFO. FINDINGS  Left Ventricle: Left ventricular ejection fraction, by estimation, is 55%. The left ventricle has normal function. The left ventricle has no regional wall motion  abnormalities. Strain was performed and the global longitudinal  strain is indeterminate. The left ventricular internal cavity size was normal in size. There is no left ventricular hypertrophy. Left ventricular diastolic parameters are indeterminate. Right Ventricle: The right ventricular size is normal. No increase in right ventricular wall thickness. Right ventricular systolic function is normal. Left Atrium: Left atrial size was moderately dilated. Right Atrium: Right atrial size was normal in size. Pericardium: There is no evidence of pericardial effusion. Mitral Valve: The mitral valve is abnormal. There is mild thickening of the mitral valve leaflet(s). There is mild calcification of the mitral valve leaflet(s). Mild mitral valve regurgitation. No evidence of mitral valve stenosis. Tricuspid Valve: The tricuspid valve is normal in structure. Tricuspid valve regurgitation is trivial. No evidence of tricuspid stenosis. Aortic Valve: The aortic valve is tricuspid. Aortic valve regurgitation is not visualized. No aortic stenosis is present. Pulmonic Valve: The pulmonic valve was normal in structure. Pulmonic valve regurgitation is not visualized. No evidence of pulmonic stenosis. Aorta: The aortic root is normal in size and structure. Venous: The inferior vena cava is normal in size with greater than 50% respiratory variability, suggesting right atrial pressure of 3 mmHg. IAS/Shunts: Cannot exclude a small PFO. Additional Comments: 3D was performed not requiring image post processing on an independent workstation and was indeterminate.  LEFT VENTRICLE PLAX 2D LVIDd:         5.34 cm LVIDs:         3.36 cm LV PW:         1.08 cm LV IVS:        1.16 cm LVOT diam:     2.20 cm LV SV:         44 LV SV Index:   17 LVOT Area:     3.80 cm  RIGHT VENTRICLE          IVC RV Basal diam:  3.58 cm  IVC diam: 1.98 cm TAPSE (M-mode): 1.9 cm LEFT ATRIUM              Index        RIGHT ATRIUM           Index LA diam:        5.12 cm   2.01 cm/m   RA Area:     26.30 cm LA Vol (A2C):   122.0 ml 48.01 ml/m  RA Volume:   84.80 ml  33.37 ml/m LA Vol (A4C):   121.0 ml 47.62 ml/m LA Biplane Vol: 123.0 ml 48.41 ml/m  AORTIC VALVE LVOT Vmax:   71.80 cm/s LVOT Vmean:  48.200 cm/s LVOT VTI:    0.115 m  AORTA Ao Root diam: 3.38 cm Ao Asc diam:  3.79 cm  SHUNTS Systemic VTI:  0.12 m Systemic Diam: 2.20 cm Maude Emmer MD Electronically signed by Maude Emmer MD Signature Date/Time: 02/09/2024/10:40:51 AM    Final    US  RENAL Result Date: 02/09/2024 CLINICAL DATA:  [id number] AKI (acute kidney injury) (HCC) [id number] EXAM: RENAL / URINARY TRACT ULTRASOUND COMPLETE COMPARISON:  CT abdomen pelvis 08/13/2022 FINDINGS: Right Kidney: Renal measurements: 16.2 x 6.9 x 7.5 cm = volume: 441 mL. Echogenicity increased. Simple superior renal cyst measuring 7.6 x 6.1 x 7.7 cm. No solid mass. Severe hydronephrosis visualized. Left Kidney: Renal measurements: 11.2 x 6.9 x 6.8 cm = volume: 273 mL. Echogenicity increased. No mass. Severe hydronephrosis visualized. Urinary bladder: Appears normal for degree of bladder distention. Other: None. IMPRESSION: 1. Persistent severe bilateral nephrolithiasis. 2. Increased renal echogenicity suggestive of renal parenchymal disease. Electronically  Signed   By: Morgane  Naveau M.D.   On: 02/09/2024 03:00   DG Chest Portable 1 View Result Date: 02/08/2024 CLINICAL DATA:  Bilateral leg weakness and kidney issues. EXAM: PORTABLE CHEST 1 VIEW COMPARISON:  08/13/2022 FINDINGS: Lungs are adequately inflated without focal airspace consolidation or effusion. Cardiomediastinal silhouette and remainder of the exam is unchanged. IMPRESSION: No active disease. Electronically Signed   By: Toribio Agreste M.D.   On: 02/08/2024 12:10    Medications: Infusions:  cefTRIAXone  (ROCEPHIN )  IV 1 g (02/10/24 0923)    Scheduled Medications:  alfuzosin   10 mg Oral Q breakfast   apixaban   5 mg Oral BID   bacitracin    Topical TID   Chlorhexidine   Gluconate Cloth  6 each Topical Daily   DULoxetine   20 mg Oral Daily   feeding supplement (NEPRO CARB STEADY)  237 mL Oral BID BM   finasteride   5 mg Oral Daily   lidocaine   1 Application Urethral Once   sodium bicarbonate   650 mg Oral TID    have reviewed scheduled and prn medications.  Physical Exam: General: Sitting at bedside, no acute distress Heart: Regular rate Lungs: Breathing comfortably, no wheezing or crackles Abdomen: Soft, nontender, nondistended Extremities: No lower extremity edema Dialysis Access: None GU: Foley catheter in place with cloudy/turbid urine   ------------------------------------------------------------- Toma Edwards, DO Internal Medicine Resident, PGY 2  02/10/2024,11:23 AM  LOS: 2 days

## 2024-02-11 LAB — URINE CULTURE: Culture: 20000 — AB

## 2024-02-13 LAB — CULTURE, BLOOD (ROUTINE X 2)
Culture: NO GROWTH
Culture: NO GROWTH

## 2024-02-19 ENCOUNTER — Other Ambulatory Visit: Payer: Self-pay | Admitting: Cardiology

## 2024-02-19 ENCOUNTER — Ambulatory Visit (INDEPENDENT_AMBULATORY_CARE_PROVIDER_SITE_OTHER): Admitting: Podiatry

## 2024-02-19 ENCOUNTER — Encounter: Payer: Self-pay | Admitting: Podiatry

## 2024-02-19 DIAGNOSIS — M79674 Pain in right toe(s): Secondary | ICD-10-CM

## 2024-02-19 DIAGNOSIS — G629 Polyneuropathy, unspecified: Secondary | ICD-10-CM | POA: Diagnosis not present

## 2024-02-19 DIAGNOSIS — B351 Tinea unguium: Secondary | ICD-10-CM | POA: Diagnosis not present

## 2024-02-19 DIAGNOSIS — M79675 Pain in left toe(s): Secondary | ICD-10-CM | POA: Diagnosis not present

## 2024-02-19 DIAGNOSIS — I4819 Other persistent atrial fibrillation: Secondary | ICD-10-CM

## 2024-02-19 DIAGNOSIS — Z7901 Long term (current) use of anticoagulants: Secondary | ICD-10-CM

## 2024-02-19 NOTE — Telephone Encounter (Signed)
 Prescription refill request for Eliquis  received. Indication:afib Last office visit:6/25 Scr:6.60  8/25 Age: 68 Weight:128.1  kg  Prescription refilled

## 2024-02-19 NOTE — Progress Notes (Unsigned)
  Subjective:  Patient ID: [name], male    DOB: February 07, 1956,  MRN: [id number]  Chief Complaint  Patient presents with   RFC    RFC nail trim. Having pain in feet. 9.  Non Diabetic.  Taking Eliquis     68 y.o. male presents with the above complaint. History confirmed with patient. Patient presenting with pain related to dystrophic thickened elongated nails. Patient is unable to trim own nails related to nail dystrophy and/or mobility issues.  Patient is on chronic anticoagulation therapy for A-fib.  He is currently on Eliquis .  Does complain of neuropathy pain.  Objective:  Physical Exam: warm, good capillary refill, pedal skin atrophic, atrophic skin to pretibial regions, chronic hemosiderin deposits consistent with venous disease, diminished pedal hair growth nail exam onychomycosis of the toenails, onycholysis, and dystrophic nails DP pulses faintly palpable due to edema, PT pulses faintly palpable due to edema, and protective sensation diminished, present at 6/10 sites, vibratory sensation diminished.  Patient reports painful paresthesias. Left Foot:  Pain with palpation of nails due to elongation and dystrophic growth.  Right Foot: Pain with palpation of nails due to elongation and dystrophic growth.   Assessment:   No diagnosis found.    Plan:  Patient was evaluated and treated and all questions answered.  # Peripheral neuropathy - Has been established with neurosurgeon - encouraged continued follow up with this   #Onychomycosis with pain  -Nails palliatively debrided as below. -Educated on self-care - Chronically anticoagulated on Eliquis   Procedure: Nail Debridement Rationale: Pain Type of Debridement: manual, sharp debridement. Instrumentation: Nail nipper, rotary burr. Number of Nails: 10  Return in about 3 months (around 05/21/2024) for Routine Foot Care.         Ethan Saddler, DPM [facility] / [facility]

## 2024-02-23 ENCOUNTER — Other Ambulatory Visit: Payer: Self-pay | Admitting: Family Medicine

## 2024-02-23 DIAGNOSIS — N1330 Unspecified hydronephrosis: Secondary | ICD-10-CM

## 2024-03-01 ENCOUNTER — Other Ambulatory Visit

## 2024-03-04 ENCOUNTER — Other Ambulatory Visit

## 2024-03-08 ENCOUNTER — Other Ambulatory Visit

## 2024-03-14 ENCOUNTER — Other Ambulatory Visit

## 2024-03-18 ENCOUNTER — Telehealth: Payer: Self-pay

## 2024-03-18 ENCOUNTER — Telehealth (HOSPITAL_BASED_OUTPATIENT_CLINIC_OR_DEPARTMENT_OTHER): Payer: Self-pay

## 2024-03-18 NOTE — Telephone Encounter (Signed)
 Patient had already been instructed to STOP Eliquis  2 days prior to procedure, patient reported.

## 2024-03-18 NOTE — Telephone Encounter (Signed)
  Patient Consent for Virtual Visit         [name] has provided verbal consent on 03/18/2024 for a virtual visit (video or telephone).  Appointment scheduled on Monday Sept 15th. Appointment needed to be scheduled ASAP. Call patient on cell : [phone]. Med req and consent are complete. Patient is aware that he needs to STOP Eliquis  prior to procedure.   CONSENT FOR VIRTUAL VISIT FOR:  [name]  By participating in this virtual visit I agree to the following:  I hereby voluntarily request, consent and authorize [facility] and its employed or contracted physicians, physician assistants, nurse practitioners or other licensed health care professionals (the Practitioner), to provide me with telemedicine health care services (the "Services) as deemed necessary by the treating Practitioner. I acknowledge and consent to receive the Services by the Practitioner via telemedicine. I understand that the telemedicine visit will involve communicating with the Practitioner through live audiovisual communication technology and the disclosure of certain medical information by electronic transmission. I acknowledge that I have been given the opportunity to request an in-person assessment or other available alternative prior to the telemedicine visit and am voluntarily participating in the telemedicine visit.  I understand that I have the right to withhold or withdraw my consent to the use of telemedicine in the course of my care at any time, without affecting my right to future care or treatment, and that the Practitioner or I may terminate the telemedicine visit at any time. I understand that I have the right to inspect all information obtained and/or recorded in the course of the telemedicine visit and may receive copies of available information for a reasonable fee.  I understand that some of the potential risks of receiving the Services via telemedicine include:  Delay or  interruption in medical evaluation due to technological equipment failure or disruption; Information transmitted may not be sufficient (e.g. poor resolution of images) to allow for appropriate medical decision making by the Practitioner; and/or  In rare instances, security protocols could fail, causing a breach of personal health information.  Furthermore, I acknowledge that it is my responsibility to provide information about my medical history, conditions and care that is complete and accurate to the best of my ability. I acknowledge that Practitioner's advice, recommendations, and/or decision may be based on factors not within their control, such as incomplete or inaccurate data provided by me or distortions of diagnostic images or specimens that may result from electronic transmissions. I understand that the practice of medicine is not an exact science and that Practitioner makes no warranties or guarantees regarding treatment outcomes. I acknowledge that a copy of this consent can be made available to me via my patient portal [facility] MyChart), or I can request a printed copy by calling the office of [facility].    I understand that my insurance will be billed for this visit.   I have read or had this consent read to me. I understand the contents of this consent, which adequately explains the benefits and risks of the Services being provided via telemedicine.  I have been provided ample opportunity to ask questions regarding this consent and the Services and have had my questions answered to my satisfaction. I give my informed consent for the services to be provided through the use of telemedicine in my medical care

## 2024-03-18 NOTE — Telephone Encounter (Signed)
   Name: [name]  DOB: Jun 10, 1956  MRN: [id number]  Primary Cardiologist: Redell Shallow, MD   Preoperative team, please contact this patient and set up a phone call appointment ASAP as procedure is scheduled for 03/28/2024; for further preoperative risk assessment. Please obtain consent and complete medication review. Thank you for your help.  I confirm that guidance regarding antiplatelet and oral anticoagulation therapy has been completed and, if necessary, noted below.  Pharmacy notified about Eliquis  hold.  I also confirmed the patient resides in the state of [place] . As per [facility] Medical Board telemedicine laws, the patient must reside in the state in which the provider is licensed.   Lamarr Satterfield, NP 03/18/2024, 4:21 PM [facility]

## 2024-03-18 NOTE — Telephone Encounter (Signed)
   Pre-operative Risk Assessment    Patient Name: [name]  DOB: [birth year] MRN: [id number]   Date of last office visit: 12/11/23 Date of next office visit: Not scheduled   Request for Surgical Clearance    Procedure:  Colonoscopy  Date of Surgery:  Clearance 03/28/24                                Surgeon:  Dr. Elsie Cree Surgeon's Group or Practice Name:  [facility] Phone number:  [phone] Fax number:  [phone]   Type of Clearance Requested:   - Medical  - Pharmacy:  Hold Apixaban  (Eliquis )     Type of Anesthesia:  Propofol    Additional requests/questions:    Bonney Ival LOISE Gerome   03/18/2024, 4:05 PM

## 2024-03-18 NOTE — Telephone Encounter (Signed)
 Patient with diagnosis of afib on Eliquis  for anticoagulation.    Procedure:  Colonoscopy  Date of procedure: 03/28/24   CHA2DS2-VASc Score = 3   This indicates a 3.2% annual risk of stroke. The patient's score is based upon: CHF History: 1 HTN History: 1 Diabetes History: 0 Stroke History: 0 Vascular Disease History: 0 Age Score: 1 Gender Score: 0      CrCl 46 ml/min Platelet count 350  Patient has not had an Afib/aflutter ablation or Watchman within the last 3 months or DCCV within the last 30 days   Per office protocol, patient can hold Eliquis  for 2 days prior to procedure.    **This guidance is not considered finalized until pre-operative APP has relayed final recommendations.**

## 2024-03-18 NOTE — Telephone Encounter (Signed)
 Pharmacy please advise on holding Eliquis  prior to colonoscopy scheduled for 03/28/2024. Last labs (CBC/Renal Function) 02/10/2024. Thank you.

## 2024-03-18 NOTE — Telephone Encounter (Signed)
 Left message for the pt to call our office and ask for the preop team to schedule TELE Preop appt.

## 2024-03-18 NOTE — Telephone Encounter (Signed)
 Appointment scheduled on Monday Sept 15th. Appointment needed to be scheduled ASAP. Call patient on cell : [phone]. Med req and consent are complete.

## 2024-03-21 ENCOUNTER — Ambulatory Visit: Attending: Internal Medicine

## 2024-03-21 DIAGNOSIS — Z01810 Encounter for preprocedural cardiovascular examination: Secondary | ICD-10-CM

## 2024-03-21 NOTE — Progress Notes (Signed)
   Patient Name: [name]  DOB: August 27, 1955 MRN: [id number]  Primary Cardiologist: Redell Shallow, MD  Patient was contacted today for scheduled televisit for preoperative cardiac evaluation.  Patient was called multiple times during allotted appointment time, unable to reach patient.  Callback number was provided.  Also called patient later in the day and was unable to reach him, office phone number provided on voicemail requesting callback to reschedule televisit.  [name], NP 03/21/2024, 5:16 PM

## 2024-03-22 ENCOUNTER — Telehealth: Payer: Self-pay

## 2024-03-22 NOTE — Telephone Encounter (Signed)
 Pt is requesting a callback regarding his tele visit appt. Please advise

## 2024-03-22 NOTE — Telephone Encounter (Signed)
 Preop tele visit now rescheduled, med rec and consent done

## 2024-03-22 NOTE — Telephone Encounter (Signed)
  Patient Consent for Virtual Visit        [name] has provided verbal consent on 03/22/2024 for a virtual visit (video or telephone).   CONSENT FOR VIRTUAL VISIT FOR:  [name]  By participating in this virtual visit I agree to the following:  I hereby voluntarily request, consent and authorize [facility] and its employed or contracted physicians, physician assistants, nurse practitioners or other licensed health care professionals (the Practitioner), to provide me with telemedicine health care services (the "Services) as deemed necessary by the treating Practitioner. I acknowledge and consent to receive the Services by the Practitioner via telemedicine. I understand that the telemedicine visit will involve communicating with the Practitioner through live audiovisual communication technology and the disclosure of certain medical information by electronic transmission. I acknowledge that I have been given the opportunity to request an in-person assessment or other available alternative prior to the telemedicine visit and am voluntarily participating in the telemedicine visit.  I understand that I have the right to withhold or withdraw my consent to the use of telemedicine in the course of my care at any time, without affecting my right to future care or treatment, and that the Practitioner or I may terminate the telemedicine visit at any time. I understand that I have the right to inspect all information obtained and/or recorded in the course of the telemedicine visit and may receive copies of available information for a reasonable fee.  I understand that some of the potential risks of receiving the Services via telemedicine include:  Delay or interruption in medical evaluation due to technological equipment failure or disruption; Information transmitted may not be sufficient (e.g. poor resolution of images) to allow for appropriate medical decision making by the  Practitioner; and/or  In rare instances, security protocols could fail, causing a breach of personal health information.  Furthermore, I acknowledge that it is my responsibility to provide information about my medical history, conditions and care that is complete and accurate to the best of my ability. I acknowledge that Practitioner's advice, recommendations, and/or decision may be based on factors not within their control, such as incomplete or inaccurate data provided by me or distortions of diagnostic images or specimens that may result from electronic transmissions. I understand that the practice of medicine is not an exact science and that Practitioner makes no warranties or guarantees regarding treatment outcomes. I acknowledge that a copy of this consent can be made available to me via my patient portal [facility] MyChart), or I can request a printed copy by calling the office of [facility].    I understand that my insurance will be billed for this visit.   I have read or had this consent read to me. I understand the contents of this consent, which adequately explains the benefits and risks of the Services being provided via telemedicine.  I have been provided ample opportunity to ask questions regarding this consent and the Services and have had my questions answered to my satisfaction. I give my informed consent for the services to be provided through the use of telemedicine in my medical care

## 2024-03-23 NOTE — Telephone Encounter (Signed)
 Pt is scheduled for TELE preop appt 03/25/24.  Will update the surgeons office

## 2024-03-25 ENCOUNTER — Ambulatory Visit: Attending: Cardiology

## 2024-03-25 DIAGNOSIS — Z01810 Encounter for preprocedural cardiovascular examination: Secondary | ICD-10-CM | POA: Insufficient documentation

## 2024-03-25 NOTE — Progress Notes (Signed)
 Virtual Visit via Telephone Note   Because of [name] co-morbid illnesses, he is at least at moderate risk for complications without adequate follow up.  This format is felt to be most appropriate for this patient at this time.  Due to technical limitations with video connection (technology), today's appointment will be conducted as an audio only telehealth visit, and [name] verbally agreed to proceed in this manner.   All issues noted in this document were discussed and addressed.  No physical exam could be performed with this format.  Evaluation Performed:  Preoperative cardiovascular risk assessment _____________   Date:  03/25/2024   Patient ID:  [name], DOB 10/21/1955, MRN [id number] Patient Location:  Home Provider location:   Office  Primary Care Provider:  Arloa Elsie SAUNDERS, MD Primary Cardiologist:  Redell Shallow, MD  Chief Complaint / Patient Profile   68 y.o. y/o male with a h/o atrial fib and CHF who is pending colonoscopy and presents today for telephonic preoperative cardiovascular risk assessment.  History of Present Illness    [name] is a 68 y.o. male who presents via audio/video conferencing for a telehealth visit today.  Pt was last seen in cardiology clinic on 12/11/2023 by Redell Shallow, MD.  At that time [name] was doing well.  The patient is now pending procedure as outlined above.   Admitted early August for urosepsis, torsemide  stopped. Echo 02/09/2024: LVEF 55%, normal LV function, no RWMA, indeterminate diastolic parameters, could not exclude small PFO.   Since his last visit and hospital admission, he denies chest pain, palpitations, dyspnea, pnd, orthopnea, n, v, dizziness, syncope, edema, weight gain, or early satiety. All other systems reviewed and are otherwise negative except as noted above. Patient states he's never felt better, and just left the gym.   Past Medical History    Past Medical  History:  Diagnosis Date   Allergic rhinitis    Anxiety    Asthma    as a child   Cellulitis    Chronic kidney disease    Chronic low back pain    Complication of anesthesia    hard to put asleep, hard to wake up, nausea and vomiting   Depression    Dysrhythmia    ED (erectile dysfunction)    GERD (gastroesophageal reflux disease)    HLD (hyperlipidemia)    HTN (hypertension)    sees Dr. Lamar Quarry, [facility]   Hypertrophy of prostate with urinary obstruction and other lower urinary tract symptoms (LUTS)    IBS (irritable bowel syndrome)    Inguinal hernia without mention of obstruction or gangrene, unilateral or unspecified, (not specified as recurrent)    Insomnia    Lumbar herniated disc    L7   Morbid obesity (HCC)    Narcotic addiction (HCC)    NICM (nonischemic cardiomyopathy) (HCC)    tachycardia mediated,  resolved with sinus   Persistent atrial fibrillation (HCC)    ablation done 03/2006 at [facility]   Pneumonia    hx of 2004   PONV (postoperative nausea and vomiting)    Sleep apnea    Twitching    legs   Past Surgical History:  Procedure Laterality Date   ATRIAL ABLATION SURGERY  2007   [facility]   CARDIOVERSION  08/11/2011   Procedure: CARDIOVERSION;  Surgeon: Redell GORMAN Shallow, MD;  Location: [facility] OR;  Service: Cardiovascular;  Laterality: N/A;   CARDIOVERSION N/A 04/16/2017   Procedure: CARDIOVERSION;  Surgeon: Francyne Headland, MD;  Location: [facility] ENDOSCOPY;  Service: Cardiovascular;  Laterality: N/A;   CARDIOVERSION N/A 03/04/2019   Procedure: CARDIOVERSION;  Surgeon: Pietro Redell RAMAN, MD;  Location: [facility] ENDOSCOPY;  Service: Cardiovascular;  Laterality: N/A;   CARDIOVERSION N/A 07/04/2019   Procedure: CARDIOVERSION;  Surgeon: Raford Riggs, MD;  Location: [facility] ENDOSCOPY;  Service: Cardiovascular;  Laterality: N/A;   EYE SURGERY     lasik, 1998   HERNIA REPAIR     1977   INGUINAL HERNIA REPAIR Right 08/26/2012   Procedure: LAPAROSCOPIC INGUINAL HERNIA;   Surgeon: Redell Faith, DO;  Location: [facility] OR;  Service: General;  Laterality: Right;  laparoscopic right inguinal hernia repair with mesh, umbilical hernia repair   INSERTION OF MESH Right 08/26/2012   Procedure: INSERTION OF MESH;  Surgeon: Redell Faith, DO;  Location: [facility] OR;  Service: General;  Laterality: Right;  right inguinal hernia   IR FLUORO GUIDE CV LINE RIGHT  08/15/2022   IR US  GUIDE VASC ACCESS RIGHT  08/15/2022   TEE WITHOUT CARDIOVERSION N/A 03/04/2019   Procedure: TRANSESOPHAGEAL ECHOCARDIOGRAM (TEE);  Surgeon: Pietro Redell RAMAN, MD;  Location: [facility] ENDOSCOPY;  Service: Cardiovascular;  Laterality: N/A;   UMBILICAL HERNIA REPAIR N/A 08/26/2012   Procedure: HERNIA REPAIR UMBILICAL ADULT;  Surgeon: Redell Faith, DO;  Location: [facility] OR;  Service: General;  Laterality: N/A;    Allergies  Allergies  Allergen Reactions   Baclofen Other (See Comments)    Severe dizziness   Doxycycline  Hyclate Other (See Comments)    Flu-like symptoms   Gabapentin  Swelling and Other (See Comments)    Swelling in feet. Pt is tolerating this at home   Hycodan [Hydrocodone  Bit-Homatrop Mbr] Other (See Comments)    Dizziness    Hydrocodone -Acetaminophen  Other (See Comments)    GI upset, Dizziness   Pregabalin  Swelling and Other (See Comments)    Severly dry mouth    Home Medications    Prior to Admission medications   Medication Sig Start Date End Date Taking? Authorizing Provider  alfuzosin  (UROXATRAL ) 10 MG 24 hr tablet Take 10 mg by mouth daily with breakfast. 01/14/23   [provider]  apixaban  (ELIQUIS ) 5 MG TABS tablet TAKE 1 TABLET BY MOUTH TWICE DAILY 02/19/24   Pietro Redell RAMAN, MD  bacitracin  ointment Apply topically 3 (three) times daily. Apply to tip of penis meatus 02/10/24   Cheryle Page, MD  DULoxetine  (CYMBALTA ) 20 MG capsule Take 1 capsule (20 mg total) by mouth daily. 02/11/24   Cheryle Page, MD  finasteride  (PROSCAR ) 5 MG tablet Take 1 tablet (5 mg total) by mouth daily.  04/28/22   Akula, Vijaya, MD  gabapentin  (NEURONTIN ) 100 MG capsule Take 1 capsule (100 mg total) by mouth at bedtime. Patient taking differently: Take 100 mg by mouth 2 (two) times daily. 05/22/23 03/22/24  Lamount Ethan CROME, DPM  methocarbamol  (ROBAXIN ) 750 MG tablet Take 750 mg by mouth 2 (two) times daily as needed for muscle spasms. 11/23/23   [provider]  rOPINIRole  (REQUIP ) 1 MG tablet Take 1-4 mg by mouth See admin instructions. Take 1mg  (1 tablet) by mouth at lunchtime and 4mg  (4 tablets) at night. 12/07/23   [provider]  sodium bicarbonate  650 MG tablet Take 1 tablet (650 mg total) by mouth 3 (three) times daily. 02/10/24   Cheryle Page, MD  traMADol  (ULTRAM ) 50 MG tablet Take 50 mg by mouth daily as needed for moderate pain (pain score 4-6). 09/08/23   [provider]  TYLENOL  500 MG tablet Take 1,500 mg by mouth as needed for mild pain or headache.    [provider]    Physical Exam    Vital Signs:  [name] does not have vital signs available for review today.  Given telephonic nature of communication, physical exam is limited. AAOx3. NAD. Normal affect.  Speech and respirations are unlabored.  Accessory Clinical Findings    None  Assessment & Plan    1.  Preoperative Cardiovascular Risk Assessment:  According to the Revised Cardiac Risk Index (RCRI), his Perioperative Risk of Major Cardiac Event is (%): 6.6  His Functional Capacity in METs is: 8.27 according to the [facility] Activity Status Index (DASI). Therefore, based on ACC/AHA guidelines, patient would be at acceptable risk for the planned procedure without further cardiovascular testing.  The patient was advised that if he develops new symptoms prior to surgery to contact our office to arrange for a follow-up visit, and he verbalized understanding.  Per office protocol, patient can hold Eliquis  for 2 days prior to procedure. Please resume when medically safe to do so.   A copy  of this note will be routed to requesting surgeon.  Time:   Today, I have spent 10 minutes with the patient with telehealth technology discussing medical history, symptoms, and management plan.     Rayven Hendrickson E Natina Wiginton, NP  03/25/2024, 2:18 PM

## 2024-04-25 ENCOUNTER — Encounter: Payer: Self-pay | Admitting: Cardiology

## 2024-04-25 NOTE — Telephone Encounter (Signed)
 Error

## 2024-04-26 ENCOUNTER — Telehealth: Payer: Self-pay | Admitting: Cardiology

## 2024-04-26 NOTE — Telephone Encounter (Signed)
 Spoke with pt, he reports his blood pressure has been elevated as high as 160/112. He does not feel like the carvedilol  is working to keep it down. Follow up scheduled tomorrow with the app. He will bring all his medications and blood pressure cuff to that appointment.

## 2024-04-26 NOTE — Telephone Encounter (Signed)
 Patient wants a call back directly from RN Adrien and did not want to say what this was regarding.

## 2024-04-27 ENCOUNTER — Encounter: Payer: Self-pay | Admitting: Physician Assistant

## 2024-04-27 ENCOUNTER — Ambulatory Visit: Attending: Physician Assistant | Admitting: Physician Assistant

## 2024-04-27 VITALS — BP 130/76 | HR 72 | Resp 16 | Ht 75.0 in | Wt 305.0 lb

## 2024-04-27 DIAGNOSIS — N184 Chronic kidney disease, stage 4 (severe): Secondary | ICD-10-CM | POA: Insufficient documentation

## 2024-04-27 DIAGNOSIS — I4821 Permanent atrial fibrillation: Secondary | ICD-10-CM | POA: Diagnosis not present

## 2024-04-27 DIAGNOSIS — I10 Essential (primary) hypertension: Secondary | ICD-10-CM | POA: Diagnosis not present

## 2024-04-27 DIAGNOSIS — I5032 Chronic diastolic (congestive) heart failure: Secondary | ICD-10-CM | POA: Insufficient documentation

## 2024-04-27 NOTE — Patient Instructions (Signed)
 Medication Instructions:   Your physician recommends that you continue on your current medications as directed. Please refer to the Current Medication list given to you today.  TAKE BLOOD PRESSURE FOR TWO WEEKS :  2 HOURS AFTER MORNING MEDICINES  AND  A FIX TIME EVERY NIGHT BEFORE YOU GO TO BED (  BRING LOG AND BLOOD PRESSURE MACHINE WITH  YOU  TO FOLLOW UP VISIT)   *If you need a refill on your cardiac medications before your next appointment, please call your pharmacy*    Lab Work:      PLEASE GO DOWN ST[facility]  FIRST FLOOR   ( GET OFF ELEVATORS WALK TOWARDS WAITING AREA LAB LOCATED BY PHARMACY):   BMET TODAY      If you have labs (blood work) drawn today and your tests are completely normal, you will receive your results only by: MyChart Message (if you have MyChart) OR A paper copy in the mail If you have any lab test that is abnormal or we need to change your treatment, we will call you to review the results.   Testing/Procedures: NONE ORDERED  TODAY     Follow-Up: At [facility], you and your health needs are our priority.  As part of our continuing mission to provide you with exceptional heart care, our providers are all part of one team.  This team includes your primary Cardiologist (physician) and Advanced Practice Providers or APPs (Physician Assistants and Nurse Practitioners) who all work together to provide you with the care you need, when you need it.  Your next appointment:  3 -4  week(s)   Provider:  Scot Ford PA-C    We recommend signing up for the patient portal called MyChart.  Sign up information is provided on this After Visit Summary.  MyChart is used to connect with patients for Virtual Visits (Telemedicine).  Patients are able to view lab/test results, encounter notes, upcoming appointments, etc.  Non-urgent messages can be sent to your provider as well.   To learn more about what you can do with MyChart, go to [email or web address].    Other Instructions

## 2024-04-27 NOTE — Progress Notes (Unsigned)
 Cardiology Office Note   Date:  04/27/2024  ID:  ROSALIE GELPI, DOB [birth year], MRN [id number] PCP: Arloa Elsie SAUNDERS, MD  [facility] Providers Cardiologist:  Redell Shallow, MD { Click to update primary MD,subspecialty MD or APP then REFRESH:1}    History of Present Illness ZAYAN DELVECCHIO is a 68 y.o. male with past medical history of permanent atrial fibrillation s/p ablation,  chronic diastolic heart failure, hypertension, CKD stage III, mitral regurgitation, morbid obesity and history of cardiomyopathy with improved EF.  Cardiac catheterization in 2004 showed EF 48%, normal coronary arteries.  He had A-fib at the time and LV function improved after sinus rhythm was restored.  He ultimately underwent ablation but had recurrence of A-fib after several years.  TEE performed in August 2020 showed normal EF, severe LAE, moderate to severe MR, mild tricuspid regurgitation.  He had cardioversion in August 2020 but A-fib continued to recur.  He was seen by Dr. Kelsie, repeat ablation was felt not to be indicated as chance of maintaining sinus rhythm will be lower given obesity and a severe left atrial enlargement.  Plan was to potentially consider referral for surgical Maze procedure and mitral valve repair.  Echocardiogram obtained in October 2020 showed normal EF, moderate LAE, trace MR.  He was placed on amiodarone  by the A-fib clinic and also referred for sleep apnea evaluation.  Repeat attempt at December 2020 was unsuccessful.  Dr. Dusty evaluated the patient to consider surgical maze procedure and felt medical therapy was the best option.  Echocardiogram in October 2023 showed normal EF, mild LVH, mild LAE, mild MR.  Venous Doppler in November 2023 showed no DVT.  Patient was last seen by Dr. Shallow in June 2025 at which time he was doing well.  Blood pressure was well-controlled on carvedilol  25 mg twice a day and amlodipine  10 mg daily.  Patient is on alfuzosyn10 mg daily as  well.  He is creatinine bumped up to 2.1, Demadex  40 mg was reduced to alternating 40 and the 20 mg every other day. He was admitted for AKI on 02/10/2024 after presented with creatinine of 6.6.  Nephrology service was consulted.  Creatinine eventually improved to 4.53 prior to discharge.  He had chronic severe bilateral hydronephrosis and had a Foley catheter placement by urology service.  He was on both finasteride  and alfuzosyn.  All blood pressure medication including amlodipine , carvedilol  and hydralazine  were discontinued due to low blood pressure waiting to be reevaluated as outpatient by PCP.  Patient presents today for evaluation of elevated blood pressure and occasional dizziness.  Based on KPN, he did see his PCP and had a blood work drawn on 02/26/2024, creatinine improved to 2.3 at the time.  Talking with the patient, he has resumed the previous amlodipine  10 mg, carvedilol  25 mg twice a day and torsemide  at 20 mg twice a day as well.  I will obtain basic metabolic panel today to make sure his renal function is stable.  For the past 1.5 weeks, he has been noticing occasional jumps in the blood pressure up to the 150s to 160s at home.  He denies any headache or blurry vision.  Interestingly, during office visit today, left arm blood pressure was 128/74, right arm pressure was 130/80.  Those manual blood pressure obtained by myself.  He is already on the highest dose of carvedilol  and amlodipine , at this time, I did not recommend any additional medication.  I recommend he keep a blood pressure diary  but 2 readings per day.  I will see him back in 3 to 4 weeks for reassessment.  He will bring his home blood pressure cuff with him on the next visit so we can calibrate against the 1 we have in the office.  ROS: ***  Studies Reviewed      *** Risk Assessment/Calculations {Does this patient have ATRIAL FIBRILLATION?:[id number]}     STOP-Bang Score:     { Consider Dx Sleep Disordered Breathing or  Sleep Apnea  ICD G47.33          :1}    Physical Exam VS:  BP 130/76 (BP Location: Left Arm, Patient Position: Sitting, Cuff Size: Large)   Pulse 72   Resp 16   Ht 6' 3 (1.905 m)   Wt (!) 305 lb (138.3 kg)   SpO2 94%   BMI 38.12 kg/m        Wt Readings from Last 3 Encounters:  04/27/24 (!) 305 lb (138.3 kg)  02/08/24 282 lb 6.6 oz (128.1 kg)  12/11/23 (!) 311 lb (141.1 kg)    GEN: Well nourished, well developed in no acute distress NECK: No JVD; No carotid bruits CARDIAC: ***RRR, no murmurs, rubs, gallops RESPIRATORY:  Clear to auscultation without rales, wheezing or rhonchi  ABDOMEN: Soft, non-tender, non-distended EXTREMITIES:  No edema; No deformity   ASSESSMENT AND PLAN ***    {Are you ordering a CV Procedure (e.g. stress test, cath, DCCV, TEE, etc)?   Press F2        :[id number]}  Dispo: ***  Signed, Scot Ford, PA

## 2024-04-28 ENCOUNTER — Ambulatory Visit (INDEPENDENT_AMBULATORY_CARE_PROVIDER_SITE_OTHER): Payer: Self-pay | Admitting: Podiatry

## 2024-04-28 DIAGNOSIS — Z91199 Patient's noncompliance with other medical treatment and regimen due to unspecified reason: Secondary | ICD-10-CM

## 2024-04-28 NOTE — Progress Notes (Unsigned)
Patient did not show for scheduled appointment today.

## 2024-05-25 ENCOUNTER — Telehealth: Payer: Self-pay | Admitting: Cardiology

## 2024-05-25 MED ORDER — CARVEDILOL 25 MG PO TABS: 25.0000 mg | ORAL_TABLET | Freq: Two times a day (BID) | ORAL | 3 refills | Status: DC

## 2024-05-25 NOTE — Telephone Encounter (Signed)
*  STAT* If patient is at the pharmacy, call can be transferred to refill team.   1. Which medications need to be refilled? (please list name of each medication and dose if known)   carvedilol  (COREG ) 25 MG tablet     2. Would you like to learn more about the convenience, safety, & potential cost savings by using the [facility]? No     3. Are you open to using the [facility] (Type [facility]. No    4. Which pharmacy/location (including street and city if local pharmacy) is medication to be sent to? [id number] - [place], [place] - [address] AT [address]     5. Do they need a 30 day or 90 day supply? 30 day   Pt is out of medication. Pt states his dosage was increased to two tablets twice daily and he needs a new Rx to reflect this change. Please advise.

## 2024-05-25 NOTE — Telephone Encounter (Signed)
 Rx sent to pharmacy

## 2024-05-26 ENCOUNTER — Encounter: Payer: Self-pay | Admitting: Physician Assistant

## 2024-05-26 ENCOUNTER — Encounter: Payer: Self-pay | Admitting: Podiatry

## 2024-05-26 ENCOUNTER — Ambulatory Visit: Admitting: Podiatry

## 2024-05-26 ENCOUNTER — Ambulatory Visit: Attending: Physician Assistant | Admitting: Physician Assistant

## 2024-05-26 VITALS — BP 136/88 | HR 82 | Ht 77.0 in | Wt 324.8 lb

## 2024-05-26 DIAGNOSIS — M79675 Pain in left toe(s): Secondary | ICD-10-CM | POA: Diagnosis not present

## 2024-05-26 DIAGNOSIS — I4821 Permanent atrial fibrillation: Secondary | ICD-10-CM | POA: Diagnosis present

## 2024-05-26 DIAGNOSIS — I5032 Chronic diastolic (congestive) heart failure: Secondary | ICD-10-CM | POA: Insufficient documentation

## 2024-05-26 DIAGNOSIS — G629 Polyneuropathy, unspecified: Secondary | ICD-10-CM

## 2024-05-26 DIAGNOSIS — I10 Essential (primary) hypertension: Secondary | ICD-10-CM | POA: Insufficient documentation

## 2024-05-26 DIAGNOSIS — Z7901 Long term (current) use of anticoagulants: Secondary | ICD-10-CM

## 2024-05-26 DIAGNOSIS — B351 Tinea unguium: Secondary | ICD-10-CM | POA: Diagnosis not present

## 2024-05-26 DIAGNOSIS — N184 Chronic kidney disease, stage 4 (severe): Secondary | ICD-10-CM | POA: Insufficient documentation

## 2024-05-26 DIAGNOSIS — M79674 Pain in right toe(s): Secondary | ICD-10-CM

## 2024-05-26 MED ORDER — CARVEDILOL 25 MG PO TABS: 25.0000 mg | ORAL_TABLET | Freq: Two times a day (BID) | ORAL | 3 refills | Status: AC

## 2024-05-26 NOTE — Patient Instructions (Signed)
 Medication Instructions:  NO CHANGES *If you need a refill on your cardiac medications before your next appointment, please call your pharmacy*  Lab Work: NO LABS If you have labs (blood work) drawn today and your tests are completely normal, you will receive your results only by: MyChart Message (if you have MyChart) OR A paper copy in the mail If you have any lab test that is abnormal or we need to change your treatment, we will call you to review the results.  Testing/Procedures: NO TESTING  Follow-Up: At [facility], you and your health needs are our priority.  As part of our continuing mission to provide you with exceptional heart care, our providers are all part of one team.  This team includes your primary Cardiologist (physician) and Advanced Practice Providers or APPs (Physician Assistants and Nurse Practitioners) who all work together to provide you with the care you need, when you need it.  Your next appointment:   6 month(s)  Provider:   Alexandria Angel, MD

## 2024-05-26 NOTE — Progress Notes (Signed)
  Subjective:  Patient ID: [name], male    DOB: 05/26/1956,  MRN: [id number]  Chief Complaint  Patient presents with   RFC    RFC. No calluses. A1c 5.9. Eliquis .    68 y.o. male presents with the above complaint. History confirmed with patient. Patient presenting with pain related to dystrophic thickened elongated nails. Patient is unable to trim own nails related to nail dystrophy and/or mobility issues.  Patient is on chronic anticoagulation therapy for A-fib.  He is currently on Eliquis .  Does complain of neuropathy pain.  Objective:  Physical Exam: warm, good capillary refill, pedal skin atrophic, atrophic skin to pretibial regions, chronic hemosiderin deposits consistent with venous disease, diminished pedal hair growth nail exam onychomycosis of the toenails, onycholysis, and dystrophic nails DP pulses faintly palpable due to edema, PT pulses faintly palpable due to edema, and protective sensation diminished, present at 6/10 sites, vibratory sensation diminished.  Patient reports painful paresthesias. Left Foot:  Pain with palpation of nails due to elongation and dystrophic growth.  Right Foot: Pain with palpation of nails due to elongation and dystrophic growth.   Assessment:   1. Pain due to onychomycosis of toenails of both feet   2. Chronic anticoagulation   3. Neuropathy       Plan:  Patient was evaluated and treated and all questions answered.  # Peripheral neuropathy - Ongoing workup with neurosurgery. - Reports he is taking Nucynta with pain management.   #Onychomycosis with pain  -Nails palliatively debrided as below. -Educated on self-care - Anticoagulated on Eliquis .  Procedure: Nail Debridement Rationale: Pain Type of Debridement: manual, sharp debridement. Instrumentation: Nail nipper, rotary burr. Number of Nails: 10   Return in about 3 months (around 08/26/2024) for Routine Foot Care.         Ethan Saddler, DPM [facility] /  [facility]

## 2024-05-26 NOTE — Progress Notes (Addendum)
 Cardiology Office Note   Date:  05/26/2024  ID:  [name], DOB 1956-05-23, MRN [id number] PCP: Arloa Elsie SAUNDERS, MD  [facility] Providers Cardiologist:  Redell Shallow, MD     History of Present Illness [name] is a 68 y.o. male with past medical history of permanent atrial fibrillation s/p ablation,  chronic diastolic heart failure, hypertension, CKD stage III, mitral regurgitation, morbid obesity and history of cardiomyopathy with improved EF.  Cardiac catheterization in 2004 showed EF 48%, normal coronary arteries.  He had A-fib at the time and LV function improved after sinus rhythm was restored. He ultimately underwent ablation but had recurrence of A-fib after several years. TEE performed in August 2020 showed normal EF, severe LAE, moderate to severe MR, mild tricuspid regurgitation.  He had cardioversion in August 2020 but A-fib continued to recur.  He was seen by Dr. Kelsie, repeat ablation was felt not to be indicated as chance of maintaining sinus rhythm will be lower given obesity and severe left atrial enlargement.  Plan was to potentially consider referral for surgical Maze procedure and mitral valve repair.  Echocardiogram obtained in October 2020 showed normal EF, moderate LAE, trace MR.  He was placed on amiodarone  by the A-fib clinic and also referred for sleep apnea evaluation.  Repeat attempt at December 2020 was unsuccessful.  Dr. Dusty evaluated the patient to consider surgical maze procedure and felt medical therapy was the best option.  Echocardiogram in October 2023 showed normal EF, mild LVH, mild LAE, mild MR.  Venous Doppler in November 2023 showed no DVT.   Patient was last seen by Dr. Shallow in June 2025 at which time he was doing well.  Blood pressure was well-controlled on carvedilol  25 mg twice a day and amlodipine  10 mg daily.  Patient is on alfuzosyn10 mg daily as well.  His creatinine bumped up to 2.1, Demadex  40 mg was reduced to  alternating 40 and the 20 mg every other day. He was admitted for AKI on 02/10/2024 after presented with creatinine of 6.6.  Nephrology service was consulted.  Creatinine eventually improved to 4.53 prior to discharge.  He had chronic severe bilateral hydronephrosis and had a Foley catheter placement by urology service.  He was on both finasteride  and alfuzosyn.  All blood pressure medication including amlodipine , carvedilol  and hydralazine  were discontinued due to low blood pressure waiting to be reevaluated as outpatient by PCP.  I last saw the patient on 04/27/2024 for evaluation of elevated blood pressure and occasional dizziness.  Creatinine has improved to 2.3 by August.  He was on amlodipine  10 mg, carvedilol  25 mg twice a day and torsemide  20 mg twice a day.  He has noticed occasional jump in the blood pressure up to 150-160s at home.  Interestingly, during the office visit, left arm pressure was 128/74 and right arm pressure was 130/80.  Patient presents today for follow-up.  He is home blood pressure cuff is about 6 to 7 mmHg higher than the office blood pressure cuff.  Overall his blood pressure is okay, may be mildly elevated.  He has been out of carvedilol , once he restarted the carvedilol , his blood pressure should be very well-controlled.  Renal function is being followed by [facility] and based on the recent report, his kidney function is very much stable.  His weight has increased by 19 pounds, however he was wearing heavy clothing today.  He appears to be euvolemic on exam.  He can follow-up  with Dr. Pietro in 27-month  ROS:   He denies chest pain, palpitations, dyspnea, pnd, orthopnea, n, v, dizziness, syncope, edema, weight gain, or early satiety. All other systems reviewed and are otherwise negative except as noted above.    Studies Reviewed      Cardiac Studies & Procedures    ______________________________________________________________________________________________     ECHOCARDIOGRAM  ECHOCARDIOGRAM COMPLETE 02/09/2024  Narrative ECHOCARDIOGRAM REPORT    Patient Name:   [name] Date of Exam: 02/09/2024 Medical Rec #:  [id number]         Height:       75.0 in Accession #:    [id number]        Weight:       282.4 lb Date of Birth:  May 09, 1956        BSA:          2.541 m Patient Age:    67 years          BP:           98/66 mmHg Patient Gender: M                 HR:           90 bpm. Exam Location:  Inpatient  Procedure: 2D Echo (Both Spectral and Color Flow Doppler were utilized during procedure).  Indications:    acute diastolic chf  History:        Patient has prior history of Echocardiogram examinations, most recent 04/25/2022. CHF and Cardiomyopathy, chronic kidney disease; Risk Factors:Hypertension, Dyslipidemia and Sleep Apnea.  Sonographer:    Tinnie Barefoot RDCS Referring Phys: [phone] [facility] SAMTANI  IMPRESSIONS   1. Left ventricular ejection fraction, by estimation, is 55%. The left ventricle has normal function. The left ventricle has no regional wall motion abnormalities. Left ventricular diastolic parameters are indeterminate. 2. Right ventricular systolic function is normal. The right ventricular size is normal. 3. Left atrial size was moderately dilated. 4. The mitral valve is abnormal. Mild mitral valve regurgitation. No evidence of mitral stenosis. 5. The aortic valve is tricuspid. Aortic valve regurgitation is not visualized. No aortic stenosis is present. 6. The inferior vena cava is normal in size with greater than 50% respiratory variability, suggesting right atrial pressure of 3 mmHg. 7. Cannot exclude a small PFO.  FINDINGS Left Ventricle: Left ventricular ejection fraction, by estimation, is 55%. The left ventricle has normal function. The left ventricle has no regional wall motion abnormalities. Strain  was performed and the global longitudinal strain is indeterminate. The left ventricular internal cavity size was normal in size. There is no left ventricular hypertrophy. Left ventricular diastolic parameters are indeterminate.  Right Ventricle: The right ventricular size is normal. No increase in right ventricular wall thickness. Right ventricular systolic function is normal.  Left Atrium: Left atrial size was moderately dilated.  Right Atrium: Right atrial size was normal in size.  Pericardium: There is no evidence of pericardial effusion.  Mitral Valve: The mitral valve is abnormal. There is mild thickening of the mitral valve leaflet(s). There is mild calcification of the mitral valve leaflet(s). Mild mitral valve regurgitation. No evidence of mitral valve stenosis.  Tricuspid Valve: The tricuspid valve is normal in structure. Tricuspid valve regurgitation is trivial. No evidence of tricuspid stenosis.  Aortic Valve: The aortic valve is tricuspid. Aortic valve regurgitation is not visualized. No aortic stenosis is present.  Pulmonic Valve: The pulmonic valve was normal in structure. Pulmonic valve regurgitation is not visualized. No evidence  of pulmonic stenosis.  Aorta: The aortic root is normal in size and structure.  Venous: The inferior vena cava is normal in size with greater than 50% respiratory variability, suggesting right atrial pressure of 3 mmHg.  IAS/Shunts: Cannot exclude a small PFO.  Additional Comments: 3D was performed not requiring image post processing on an independent workstation and was indeterminate.   LEFT VENTRICLE PLAX 2D LVIDd:         5.34 cm LVIDs:         3.36 cm LV PW:         1.08 cm LV IVS:        1.16 cm LVOT diam:     2.20 cm LV SV:         44 LV SV Index:   17 LVOT Area:     3.80 cm   RIGHT VENTRICLE          IVC RV Basal diam:  3.58 cm  IVC diam: 1.98 cm TAPSE (M-mode): 1.9 cm  LEFT ATRIUM              Index        RIGHT ATRIUM            Index LA diam:        5.12 cm  2.01 cm/m   RA Area:     26.30 cm LA Vol (A2C):   122.0 ml 48.01 ml/m  RA Volume:   84.80 ml  33.37 ml/m LA Vol (A4C):   121.0 ml 47.62 ml/m LA Biplane Vol: 123.0 ml 48.41 ml/m AORTIC VALVE LVOT Vmax:   71.80 cm/s LVOT Vmean:  48.200 cm/s LVOT VTI:    0.115 m  AORTA Ao Root diam: 3.38 cm Ao Asc diam:  3.79 cm   SHUNTS Systemic VTI:  0.12 m Systemic Diam: 2.20 cm  Maude Emmer MD Electronically signed by Maude Emmer MD Signature Date/Time: 02/09/2024/10:40:51 AM    Final   TEE  ECHO TEE 03/04/2019  Narrative TRANSESOPHOGEAL ECHO REPORT    Patient Name:   [name] Date of Exam: 03/04/2019 Medical Rec #:  [id number]         Height:       77.0 in Accession #:    [id number]        Weight:       329.0 lb Date of Birth:  08-18-1955        BSA:          2.76 m Patient Age:    62 years          BP:           144/101 mmHg Patient Gender: M                 HR:           123 bpm. Exam Location:  Inpatient   Procedure: Transesophageal Echo, Cardiac Doppler and Color Doppler  Indications:     Afib  History:         Patient has prior history of Echocardiogram examinations, most recent 03/31/2017. Atrial Fibrillation Risk Factors: Hypertension, Obesity and Sleep Apnea.  Sonographer:     Lyle Marc Referring Phys:  [id number] BRIAN S CRENSHAW Diagnosing Phys: Redell Shallow MD    PROCEDURE: The transesophogeal probe was passed through the esophogus of the patient. The patient developed no complications during the procedure.  IMPRESSIONS   1. The left ventricle has normal systolic function, with an ejection fraction of  55-60%. No evidence of left ventricular regional wall motion abnormalities. 2. The right ventricle has normal systolc function. The cavity was normal. 3. Left atrial size was severely dilated. 4. No evidence of a thrombus present in the left atrial appendage. 5. The mitral valve is abnormal. Mild  thickening of the mitral valve leaflet. Mitral valve regurgitation is moderate to severe by color flow Doppler. 6. The aortic valve is tricuspid No stenosis of the aortic valve. 7. There is evidence of mild plaque in the descending aorta. 8. Normal LV systolic function; severe LAE with no LAA thrombus; mild RAE; moderately severe (3+) MR; mild TR.  FINDINGS Left Ventricle: The left ventricle has normal systolic function, with an ejection fraction of 55-60%. No evidence of left ventricular regional wall motion abnormalities.  Right Ventricle: The right ventricle has normal systolic function. The cavity was normal.  Left Atrium: Left atrial size was severely dilated.   Left Atrial Appendage: No evidence of a thrombus present in the left atrial appendage.  Right Atrium: Right atrial size was mildly dilated.  Interatrial Septum: No atrial level shunt detected by color flow Doppler.  Pericardium: There is no evidence of pericardial effusion.  Mitral Valve: The mitral valve is abnormal. Mild thickening of the mitral valve leaflet. Mitral valve regurgitation is moderate to severe by color flow Doppler.  Tricuspid Valve: The tricuspid valve was normal in structure. Tricuspid valve regurgitation is mild by color flow Doppler.  Aortic Valve: The aortic valve is tricuspid Aortic valve regurgitation was not visualized by color flow Doppler. There is No stenosis of the aortic valve.  Pulmonic Valve: The pulmonic valve was grossly normal. Pulmonic valve regurgitation is not visualized by color flow Doppler.  Aorta: There is evidence of mild plaque in the descending aorta.  Additional Findings: Normal LV systolic function; severe LAE with no LAA thrombus; mild RAE; moderately severe (3+) MR; mild TR.   Redell Shallow MD Electronically signed by Redell Shallow MD Signature Date/Time: 03/04/2019/10:21:22 AM    Final         ______________________________________________________________________________________________      Risk Assessment/Calculations  CHA2DS2-VASc Score = 3   This indicates a 3.2% annual risk of stroke. The patient's score is based upon: CHF History: 1 HTN History: 1 Diabetes History: 0 Stroke History: 0 Vascular Disease History: 0 Age Score: 1 Gender Score: 0        STOP-Bang Score:         Physical Exam VS:  BP 136/88 (BP Location: Right Arm, Patient Position: Sitting, Cuff Size: Large)   Pulse 82   Ht 6' 5 (1.956 m)   Wt (!) 324 lb 12.8 oz (147.3 kg)   SpO2 96%   BMI 38.52 kg/m        Wt Readings from Last 3 Encounters:  05/26/24 (!) 324 lb 12.8 oz (147.3 kg)  04/27/24 (!) 305 lb (138.3 kg)  02/08/24 282 lb 6.6 oz (128.1 kg)    GEN: Well nourished, well developed in no acute distress NECK: No JVD; No carotid bruits CARDIAC: Irregularly irregular, no murmurs, rubs, gallops RESPIRATORY:  Clear to auscultation without rales, wheezing or rhonchi  ABDOMEN: Soft, non-tender, non-distended EXTREMITIES:  No edema; No deformity   ASSESSMENT AND PLAN  Permanent atrial fibrillation: Continue Eliquis  and carvedilol .  Patient ran out of carvedilol  for the past 3 days, fortunately despite not on rate control medication, heart rate is not bad.  He will need to resume carvedilol .  Chronic diastolic heart failure: He has  a history of systolic heart failure however EF improved.  Most recent echocardiogram obtained in August 2025 showed EF 55%.  Hypertension: Blood pressure stable despite the fact that he has been out of carvedilol  for the past 3 days, will resume carvedilol , initially at half a tablet twice a day and after 3 days, go back to full tablet twice a day. Addendum: I called his pharmacy and spoke with the patient, apparently he misunderstood the previous instruction and was taking 50 mg carvedilol  for the past month, that is why he ran out of carvedilol  earlier.  I  called his pharmacy as his insurance will not cover new prescription of carvedilol  until next month.  Out-of-pocket cost for 90-day supply is about $30.  Patient is willing to pay out-of-pocket.  CKD: Followed by [facility].  Per patient, most recent blood work showed stable renal function with creatinine around 2.2.  I do not have the record.       Dispo: Follow-up with Dr. Pietro in 6 months.  Signed, Scot Ford, PA

## 2024-05-31 ENCOUNTER — Ambulatory Visit: Payer: Self-pay | Admitting: Podiatry

## 2024-07-08 ENCOUNTER — Telehealth (HOSPITAL_BASED_OUTPATIENT_CLINIC_OR_DEPARTMENT_OTHER): Payer: Self-pay

## 2024-07-08 NOTE — Telephone Encounter (Signed)
 Pharmacy please advise on holding eliquis  prior to Spinal cord stimulator scheduled for 07/22/2024. Thank you.   Last labs: 02/10/2024

## 2024-07-08 NOTE — Telephone Encounter (Signed)
 Patient with diagnosis of afib on Eliquis  for anticoagulation.    Procedure: Spinal cord stimulator  Date of procedure: 07/22/24   CHA2DS2-VASc Score = 3   This indicates a 3.2% annual risk of stroke. The patient's score is based upon: CHF History: 1 HTN History: 1 Diabetes History: 0 Stroke History: 0 Vascular Disease History: 0 Age Score: 1 Gender Score: 0      CrCl 44 ml/min Platelet count 350  Patient has not had an Afib/aflutter ablation in the last 3 months, DCCV within the last 4 weeks or a watchman implanted in the last 45 days    Per office protocol, patient can hold Eliquis  for 3 days prior to procedure.    **This guidance is not considered finalized until pre-operative APP has relayed final recommendations.**

## 2024-07-08 NOTE — Telephone Encounter (Signed)
"  ° °  Pre-operative Risk Assessment    Patient Name: [name]  DOB: 12-Nov-1955 MRN: [id number]   Date of last office visit: 05/26/2024 with Scot Ford, PA Date of next office visit: None  Request for Surgical Clearance    Procedure:  Spinal cord stimulator  Date of Surgery:  Clearance 07/22/24                                 Surgeon:  Rockey Pae, MD Surgeon's Group or Practice Name:  [facility] Phone number:  [phone] Fax number:  [phone]   Type of Clearance Requested:   - Medical  - Pharmacy:  Hold Apixaban  (Eliquis ) -3 days prior to procedure, and day of procedure (per ARSA guidelines). May resume 24 hours after procedure   Type of Anesthesia:  MAC   Additional requests/questions:  None  Signed, Patrcia Iverson CROME   07/08/2024, 11:33 AM   "

## 2024-07-08 NOTE — Telephone Encounter (Signed)
 Hao, you recently saw this pt in clinic. Are you able to comment on surgical clearance for upcoming Spinal cord stimulator scheduled for 07/22/2024? Please route your response to P CV DIV PREOP. Thank you!

## 2024-07-11 NOTE — Telephone Encounter (Signed)
"  ° °  Patient Name: [name]  DOB: May 31, 1956 MRN: [id number]  Primary Cardiologist: Redell Shallow, MD  Chart reviewed as part of pre-operative protocol coverage. Given past medical history and time since last visit, based on ACC/AHA guidelines, [name] is at acceptable risk for the planned procedure without further cardiovascular testing.   I will route this recommendation to the requesting party via Epic fax function and remove from pre-op pool.  Please call with questions.  [name], [name] 07/11/2024, 11:03 AM  "

## 2024-07-20 ENCOUNTER — Telehealth: Payer: Self-pay | Admitting: Neurology

## 2024-07-20 DIAGNOSIS — G6289 Other specified polyneuropathies: Secondary | ICD-10-CM

## 2024-07-20 MED ORDER — GABAPENTIN 100 MG PO CAPS: 100.0000 mg | ORAL_CAPSULE | Freq: Three times a day (TID) | ORAL | 2 refills | Status: AC

## 2024-07-20 NOTE — Telephone Encounter (Signed)
 Pt has scheduled his 1 yr f/u, on wait list also, pt states he is out of his gabapentin  (NEURONTIN ) 100 MG capsule and is asking for a new Rx to be sent to [facility] [phone]

## 2024-07-20 NOTE — Telephone Encounter (Signed)
 Last seen on 08/24/23 Follow up scheduled on 09/16/24 Rx sent

## 2024-08-11 ENCOUNTER — Other Ambulatory Visit: Payer: Self-pay | Admitting: Cardiology

## 2024-08-26 ENCOUNTER — Ambulatory Visit: Admitting: Podiatry

## 2024-09-16 ENCOUNTER — Ambulatory Visit: Admitting: Neurology
